# Patient Record
Sex: Male | Born: 1945
Health system: Southern US, Community
[De-identification: ages and names within clinical notes are randomized; demographics above are authoritative.]

## PROBLEM LIST (undated history)

## (undated) DIAGNOSIS — I252 Old myocardial infarction: Secondary | ICD-10-CM

## (undated) DIAGNOSIS — G40909 Epilepsy, unspecified, not intractable, without status epilepticus: Secondary | ICD-10-CM

## (undated) DIAGNOSIS — K863 Pseudocyst of pancreas: Secondary | ICD-10-CM

## (undated) DIAGNOSIS — M21862 Other specified acquired deformities of left lower leg: Secondary | ICD-10-CM

## (undated) DIAGNOSIS — E119 Type 2 diabetes mellitus without complications: Secondary | ICD-10-CM

## (undated) DIAGNOSIS — Z8679 Personal history of other diseases of the circulatory system: Secondary | ICD-10-CM

## (undated) DIAGNOSIS — I10 Essential (primary) hypertension: Secondary | ICD-10-CM

## (undated) DIAGNOSIS — I672 Cerebral atherosclerosis: Secondary | ICD-10-CM

## (undated) DIAGNOSIS — Z9181 History of falling: Secondary | ICD-10-CM

## (undated) DIAGNOSIS — I2510 Atherosclerotic heart disease of native coronary artery without angina pectoris: Secondary | ICD-10-CM

## (undated) DIAGNOSIS — Z85828 Personal history of other malignant neoplasm of skin: Secondary | ICD-10-CM

## (undated) DIAGNOSIS — I69398 Other sequelae of cerebral infarction: Secondary | ICD-10-CM

## (undated) DIAGNOSIS — H9190 Unspecified hearing loss, unspecified ear: Secondary | ICD-10-CM

## (undated) DIAGNOSIS — G20 Parkinson's disease: Secondary | ICD-10-CM

## (undated) DIAGNOSIS — G8114 Spastic hemiplegia affecting left nondominant side: Secondary | ICD-10-CM

## (undated) DIAGNOSIS — I739 Peripheral vascular disease, unspecified: Secondary | ICD-10-CM

## (undated) DIAGNOSIS — M21372 Foot drop, left foot: Secondary | ICD-10-CM

## (undated) DIAGNOSIS — C61 Malignant neoplasm of prostate: Secondary | ICD-10-CM

## (undated) DIAGNOSIS — C679 Malignant neoplasm of bladder, unspecified: Secondary | ICD-10-CM

## (undated) DIAGNOSIS — G20C Parkinsonism, unspecified: Secondary | ICD-10-CM

## (undated) DIAGNOSIS — I6930 Unspecified sequelae of cerebral infarction: Secondary | ICD-10-CM

## (undated) DIAGNOSIS — Z951 Presence of aortocoronary bypass graft: Secondary | ICD-10-CM

## (undated) DIAGNOSIS — R269 Unspecified abnormalities of gait and mobility: Secondary | ICD-10-CM

## (undated) DIAGNOSIS — R414 Neurologic neglect syndrome: Secondary | ICD-10-CM

## (undated) DIAGNOSIS — E785 Hyperlipidemia, unspecified: Secondary | ICD-10-CM

## (undated) DIAGNOSIS — Z9889 Other specified postprocedural states: Secondary | ICD-10-CM

## (undated) HISTORY — PX: CAROTID ENDARTERECTOMY: SUR193

## (undated) HISTORY — DX: Essential (primary) hypertension: I10

## (undated) HISTORY — PX: CAROTID STENT INSERTION: SHX5766

## (undated) HISTORY — DX: Atherosclerotic heart disease of native coronary artery without angina pectoris: I25.10

## (undated) HISTORY — DX: Pseudocyst of pancreas: K86.3

## (undated) HISTORY — DX: Peripheral vascular disease, unspecified: I73.9

## (undated) HISTORY — DX: Epilepsy, unspecified, not intractable, without status epilepticus: G40.909

## (undated) HISTORY — DX: Malignant neoplasm of bladder, unspecified: C67.9

## (undated) HISTORY — PX: CORONARY ARTERY BYPASS GRAFT: SHX141

## (undated) HISTORY — DX: Hyperlipidemia, unspecified: E78.5

## (undated) HISTORY — PX: NM MYOCAR PERF WALL MOTION: HXRAD629

---

## 1996-10-13 HISTORY — PX: LAPAROSCOPIC CHOLECYSTECTOMY: SUR755

## 1996-10-13 HISTORY — PX: OTHER SURGICAL HISTORY: SHX169

## 1999-07-12 ENCOUNTER — Encounter
Admission: RE | Admit: 1999-07-12 | Discharge: 1999-10-10 | Payer: Self-pay | Admitting: Physical Medicine & Rehabilitation

## 1999-08-12 ENCOUNTER — Encounter: Payer: Self-pay | Admitting: *Deleted

## 1999-08-12 ENCOUNTER — Inpatient Hospital Stay (HOSPITAL_COMMUNITY): Admission: EM | Admit: 1999-08-12 | Discharge: 1999-08-13 | Payer: Self-pay | Admitting: *Deleted

## 1999-08-12 ENCOUNTER — Encounter: Payer: Self-pay | Admitting: Neurology

## 1999-08-12 ENCOUNTER — Emergency Department (HOSPITAL_COMMUNITY): Admission: EM | Admit: 1999-08-12 | Discharge: 1999-08-12 | Payer: Self-pay | Admitting: *Deleted

## 1999-12-31 ENCOUNTER — Encounter
Admission: RE | Admit: 1999-12-31 | Discharge: 2000-01-17 | Payer: Self-pay | Admitting: Physical Medicine & Rehabilitation

## 2000-07-09 ENCOUNTER — Emergency Department (HOSPITAL_COMMUNITY): Admission: EM | Admit: 2000-07-09 | Discharge: 2000-07-09 | Payer: Self-pay | Admitting: Emergency Medicine

## 2000-07-09 ENCOUNTER — Encounter: Payer: Self-pay | Admitting: Emergency Medicine

## 2000-10-19 ENCOUNTER — Encounter
Admission: RE | Admit: 2000-10-19 | Discharge: 2001-01-17 | Payer: Self-pay | Admitting: Physical Medicine & Rehabilitation

## 2002-05-19 ENCOUNTER — Encounter
Admission: RE | Admit: 2002-05-19 | Discharge: 2002-06-29 | Payer: Self-pay | Admitting: Physical Medicine & Rehabilitation

## 2002-11-17 ENCOUNTER — Inpatient Hospital Stay (HOSPITAL_COMMUNITY): Admission: RE | Admit: 2002-11-17 | Discharge: 2002-11-26 | Payer: Self-pay | Admitting: *Deleted

## 2002-11-17 ENCOUNTER — Encounter: Payer: Self-pay | Admitting: *Deleted

## 2002-11-17 HISTORY — PX: CARDIAC CATHETERIZATION: SHX172

## 2002-11-18 ENCOUNTER — Encounter: Payer: Self-pay | Admitting: *Deleted

## 2002-11-21 ENCOUNTER — Encounter: Payer: Self-pay | Admitting: Thoracic Surgery (Cardiothoracic Vascular Surgery)

## 2002-11-22 ENCOUNTER — Encounter: Payer: Self-pay | Admitting: Thoracic Surgery (Cardiothoracic Vascular Surgery)

## 2002-11-23 ENCOUNTER — Encounter: Payer: Self-pay | Admitting: Thoracic Surgery (Cardiothoracic Vascular Surgery)

## 2002-11-24 ENCOUNTER — Encounter: Payer: Self-pay | Admitting: Thoracic Surgery (Cardiothoracic Vascular Surgery)

## 2002-12-12 ENCOUNTER — Encounter: Payer: Self-pay | Admitting: Thoracic Surgery (Cardiothoracic Vascular Surgery)

## 2002-12-12 ENCOUNTER — Encounter
Admission: RE | Admit: 2002-12-12 | Discharge: 2002-12-12 | Payer: Self-pay | Admitting: Thoracic Surgery (Cardiothoracic Vascular Surgery)

## 2002-12-24 ENCOUNTER — Encounter: Payer: Self-pay | Admitting: Emergency Medicine

## 2002-12-24 ENCOUNTER — Emergency Department (HOSPITAL_COMMUNITY): Admission: EM | Admit: 2002-12-24 | Discharge: 2002-12-24 | Payer: Self-pay

## 2003-07-13 ENCOUNTER — Emergency Department (HOSPITAL_COMMUNITY): Admission: EM | Admit: 2003-07-13 | Discharge: 2003-07-13 | Payer: Self-pay | Admitting: Emergency Medicine

## 2003-09-05 ENCOUNTER — Encounter
Admission: RE | Admit: 2003-09-05 | Discharge: 2003-12-04 | Payer: Self-pay | Admitting: Physical Medicine & Rehabilitation

## 2003-09-29 ENCOUNTER — Inpatient Hospital Stay (HOSPITAL_COMMUNITY): Admission: EM | Admit: 2003-09-29 | Discharge: 2003-10-04 | Payer: Self-pay | Admitting: Emergency Medicine

## 2003-10-02 ENCOUNTER — Encounter (INDEPENDENT_AMBULATORY_CARE_PROVIDER_SITE_OTHER): Payer: Self-pay | Admitting: Cardiovascular Disease

## 2003-10-03 ENCOUNTER — Encounter (INDEPENDENT_AMBULATORY_CARE_PROVIDER_SITE_OTHER): Payer: Self-pay | Admitting: Cardiology

## 2003-10-06 ENCOUNTER — Ambulatory Visit (HOSPITAL_COMMUNITY): Admission: RE | Admit: 2003-10-06 | Discharge: 2003-10-06 | Payer: Self-pay | Admitting: Neurology

## 2003-12-04 ENCOUNTER — Encounter
Admission: RE | Admit: 2003-12-04 | Discharge: 2004-03-03 | Payer: Self-pay | Admitting: Physical Medicine & Rehabilitation

## 2003-12-12 ENCOUNTER — Ambulatory Visit (HOSPITAL_COMMUNITY): Admission: RE | Admit: 2003-12-12 | Discharge: 2003-12-12 | Payer: Self-pay | Admitting: Neurology

## 2004-03-18 ENCOUNTER — Encounter
Admission: RE | Admit: 2004-03-18 | Discharge: 2004-06-16 | Payer: Self-pay | Admitting: Physical Medicine & Rehabilitation

## 2004-08-21 ENCOUNTER — Ambulatory Visit (HOSPITAL_COMMUNITY): Admission: RE | Admit: 2004-08-21 | Discharge: 2004-08-21 | Payer: Self-pay | Admitting: Gastroenterology

## 2004-10-25 ENCOUNTER — Encounter
Admission: RE | Admit: 2004-10-25 | Discharge: 2005-01-23 | Payer: Self-pay | Admitting: Physical Medicine & Rehabilitation

## 2004-10-29 ENCOUNTER — Ambulatory Visit: Payer: Self-pay | Admitting: Physical Medicine & Rehabilitation

## 2004-12-11 ENCOUNTER — Ambulatory Visit: Payer: Self-pay | Admitting: Physical Medicine & Rehabilitation

## 2005-04-04 ENCOUNTER — Encounter
Admission: RE | Admit: 2005-04-04 | Discharge: 2005-07-03 | Payer: Self-pay | Admitting: Physical Medicine & Rehabilitation

## 2005-05-05 ENCOUNTER — Ambulatory Visit: Payer: Self-pay | Admitting: Physical Medicine & Rehabilitation

## 2005-10-17 ENCOUNTER — Encounter
Admission: RE | Admit: 2005-10-17 | Discharge: 2006-01-15 | Payer: Self-pay | Admitting: Physical Medicine & Rehabilitation

## 2005-11-07 ENCOUNTER — Ambulatory Visit: Payer: Self-pay | Admitting: Physical Medicine & Rehabilitation

## 2006-05-04 ENCOUNTER — Ambulatory Visit: Payer: Self-pay | Admitting: Physical Medicine & Rehabilitation

## 2006-05-04 ENCOUNTER — Encounter
Admission: RE | Admit: 2006-05-04 | Discharge: 2006-08-02 | Payer: Self-pay | Admitting: Physical Medicine & Rehabilitation

## 2006-10-26 ENCOUNTER — Encounter
Admission: RE | Admit: 2006-10-26 | Discharge: 2007-01-24 | Payer: Self-pay | Admitting: Physical Medicine & Rehabilitation

## 2006-10-26 ENCOUNTER — Ambulatory Visit: Payer: Self-pay | Admitting: Physical Medicine & Rehabilitation

## 2006-11-05 ENCOUNTER — Encounter
Admission: RE | Admit: 2006-11-05 | Discharge: 2006-11-19 | Payer: Self-pay | Admitting: Physical Medicine & Rehabilitation

## 2006-11-12 ENCOUNTER — Inpatient Hospital Stay (HOSPITAL_COMMUNITY): Admission: EM | Admit: 2006-11-12 | Discharge: 2006-11-14 | Payer: Self-pay | Admitting: Emergency Medicine

## 2006-11-13 ENCOUNTER — Encounter (INDEPENDENT_AMBULATORY_CARE_PROVIDER_SITE_OTHER): Payer: Self-pay | Admitting: Neurology

## 2006-11-13 ENCOUNTER — Encounter (INDEPENDENT_AMBULATORY_CARE_PROVIDER_SITE_OTHER): Payer: Self-pay | Admitting: Cardiology

## 2006-11-20 ENCOUNTER — Encounter: Payer: Self-pay | Admitting: Neurology

## 2006-11-20 ENCOUNTER — Inpatient Hospital Stay (HOSPITAL_COMMUNITY): Admission: AD | Admit: 2006-11-20 | Discharge: 2006-11-22 | Payer: Self-pay | Admitting: Neurology

## 2006-12-22 ENCOUNTER — Ambulatory Visit: Payer: Self-pay | Admitting: Physical Medicine & Rehabilitation

## 2007-06-11 ENCOUNTER — Encounter
Admission: RE | Admit: 2007-06-11 | Discharge: 2007-06-15 | Payer: Self-pay | Admitting: Physical Medicine & Rehabilitation

## 2007-06-15 ENCOUNTER — Ambulatory Visit: Payer: Self-pay | Admitting: Physical Medicine & Rehabilitation

## 2008-08-24 ENCOUNTER — Encounter
Admission: RE | Admit: 2008-08-24 | Discharge: 2008-08-25 | Payer: Self-pay | Admitting: Physical Medicine & Rehabilitation

## 2008-08-25 ENCOUNTER — Ambulatory Visit: Payer: Self-pay | Admitting: Physical Medicine & Rehabilitation

## 2009-08-21 ENCOUNTER — Encounter
Admission: RE | Admit: 2009-08-21 | Discharge: 2009-10-04 | Payer: Self-pay | Admitting: Physical Medicine & Rehabilitation

## 2009-08-21 ENCOUNTER — Ambulatory Visit: Payer: Self-pay | Admitting: Physical Medicine & Rehabilitation

## 2009-10-17 ENCOUNTER — Encounter
Admission: RE | Admit: 2009-10-17 | Discharge: 2010-01-15 | Payer: Self-pay | Admitting: Physical Medicine & Rehabilitation

## 2009-10-19 ENCOUNTER — Ambulatory Visit: Payer: Self-pay | Admitting: Physical Medicine & Rehabilitation

## 2010-04-04 ENCOUNTER — Encounter
Admission: RE | Admit: 2010-04-04 | Discharge: 2010-04-10 | Payer: Self-pay | Admitting: Physical Medicine & Rehabilitation

## 2010-04-10 ENCOUNTER — Ambulatory Visit: Payer: Self-pay | Admitting: Physical Medicine & Rehabilitation

## 2011-01-03 HISTORY — PX: US ECHOCARDIOGRAPHY: HXRAD669

## 2011-01-14 ENCOUNTER — Other Ambulatory Visit (INDEPENDENT_AMBULATORY_CARE_PROVIDER_SITE_OTHER): Payer: Medicare Other

## 2011-01-14 ENCOUNTER — Encounter (INDEPENDENT_AMBULATORY_CARE_PROVIDER_SITE_OTHER): Payer: Medicare Other | Admitting: Vascular Surgery

## 2011-01-14 DIAGNOSIS — I6529 Occlusion and stenosis of unspecified carotid artery: Secondary | ICD-10-CM

## 2011-01-14 DIAGNOSIS — Z48812 Encounter for surgical aftercare following surgery on the circulatory system: Secondary | ICD-10-CM

## 2011-01-14 NOTE — Consult Note (Signed)
NEW PATIENT CONSULTATION  [name], [name] DOB:  1945/10/18                                       01/14/2011 [id number]#:[id number]  Patient presents today for evaluation of extracranial cerebrovascular occlusive disease.  He is a very pleasant 65 year old gentleman with a complex past history of cerebrovascular occlusive disease.  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  He had a continued prolonged neurologic deficit following this.  He continues to have minimal use of his left arm.  He is able to walk with the assistance of a cane.  Subsequently he did have coronary artery bypass grafting in February 2004 at [facility] with Dr. Andrey Spearman.  He began having seizures in February 2009 and is on medication for this.  He did report what sounds like transient ischemic attacks 6-8 years ago but nothing recent.  He does have elevated cholesterol, and he does have elevated blood pressure.  He has a long history of cigarette smoking, and fortunately quit this in 2009.  He does not drink alcohol.  He is divorced with 4 children.  FAMILY HISTORY:  Significant for father dying at age 37 of cerebral hemorrhage.  REVIEW OF SYSTEMS:  Negative for weight loss or gain.  He weighs 147 pounds.  He is 5 feet 7 inches tall. VASCULAR:  Positive for  prior stroke. CARDIAC:  Prior coronary artery bypass grafting. GI:  Negative. NEUROLOGIC:  Positive for prior stroke and seizure. PULMONARY:  Negative. HEMATOLOGIC:  Negative. GU, ENT, musculoskeletal, psychiatric were all negative.  PHYSICAL EXAMINATION:  A well-developed and well-nourished white male appearing his stated age.  Blood pressure 144/94, pulse 72, respirations 16.  HEENT is normal.  Chest:  Clear bilaterally.  Heart:  Regular  rate and rhythm.  I do not appreciate carotid bruits.  He has a well-healed incision __________.  He does have 2+ radial pulses bilaterally. Abdomen:  Soft, nontender.  No masses noted.  Musculoskeletal:  No major deformities or cyanosis.  Neurologic:  He does have minimal use with paralysis of his left upper extremity and does have a brace to keep him from having a foot drop in his left lower extremity.  Skin:  Without ulcers or rashes.  He underwent repeat carotid duplex in our office, and this had several findings.  He does have high-grade stenosis in the distal common carotid artery just below the bifurcation on the left.  On the right, there is no flow-limiting stenosis, but he does have web-like structure in the carotid bulb, which could represent a focal dissection or scarring from his old surgery.  I discussed all these at length with patient.  I have recommend formal cerebral arteriogram due to the complexity of both these carotid lesions.  This has been scheduled as an outpatient at [facility] with Dr. Durene Cal on 04/11.  We will make further recommendations pending this arteriogram.  I did explain an approximately 1% risk of stroke or neurologic deficit with the cerebral arteriogram.  This is scheduled for 04/11.  __________.    Larina Earthly, M.D. Electronically Signed  TFE/MEDQ  D:  01/14/2011  T:  01/14/2011  Job:  [id number]  cc:  Evie Lacks, MD Richard A. Alanda Amass, M.D.

## 2011-01-16 NOTE — Procedures (Unsigned)
CAROTID DUPLEX EXAM  INDICATION:  Follow up carotid disease.  History of prior study at outside facility on 12/17/10 showed >70% left CCA stenosis, 50% to 69% right bulb stenosis.  HISTORY: Diabetes:  No. Cardiac:  CABG. Hypertension:  Yes. Smoking:  Previous. Previous Surgery:  Bilateral CEAs in 1999. CV History:  Stroke, 1999 and 2004; no new symptoms. Amaurosis Fugax , Paresthesias , Hemiparesis                                      RIGHT             LEFT Brachial systolic pressure:         150               142 Brachial Doppler waveforms: Vertebral direction of flow:        Abnormal antegrade                  Antegrade DUPLEX VELOCITIES (cm/sec) CCA peak systolic                   192               427/120 ECA peak systolic                   84                116 ICA peak systolic                   84                85 ICA end diastolic                   25                28 PLAQUE MORPHOLOGY: PLAQUE AMOUNT:                      Moderate          Severe PLAQUE LOCATION:                    CCA               CCA  IMPRESSION: 1. Patent bilateral carotid endarterectomy with significant post-     surgical changes observed. 2. The right distal common carotid artery has possible ulcerative     plaque versus dissection. 3. The right internal carotid artery has minimal restenosis (1% to     39%). 4. The left common carotid artery at the carotid endarterectomy start     point has significant focal hyperplasia with velocities of 427     cm/s. 5. The left internal carotid artery is difficult to grade due to     proximal disease; however, no significant stenosis is observed.     Distal waveforms are abnormal and may suggest more distal disease. 6. The right vertebral artery is abnormal antegrade.  The left is     within normal limits.  ___________________________________________ Larina Earthly, M.D.  LT/MEDQ  D:  01/14/2011  T:  01/14/2011  Job:   [id number]

## 2011-01-20 ENCOUNTER — Ambulatory Visit
Admission: RE | Admit: 2011-01-20 | Discharge: 2011-01-20 | Disposition: A | Discharge status: Home / Self Care | Payer: Medicare Other | Source: Ambulatory Visit | Attending: Family Medicine | Admitting: Family Medicine

## 2011-01-20 ENCOUNTER — Other Ambulatory Visit: Payer: Self-pay | Admitting: Family Medicine

## 2011-01-20 DIAGNOSIS — M2540 Effusion, unspecified joint: Secondary | ICD-10-CM

## 2011-01-22 ENCOUNTER — Ambulatory Visit (HOSPITAL_COMMUNITY)
Admission: RE | Admit: 2011-01-22 | Discharge: 2011-01-22 | Disposition: A | Discharge status: Home / Self Care | Payer: Medicare Other | Source: Ambulatory Visit | Attending: Surgery | Admitting: Surgery

## 2011-01-22 DIAGNOSIS — I69998 Other sequelae following unspecified cerebrovascular disease: Secondary | ICD-10-CM | POA: Insufficient documentation

## 2011-01-22 DIAGNOSIS — E785 Hyperlipidemia, unspecified: Secondary | ICD-10-CM | POA: Insufficient documentation

## 2011-01-22 DIAGNOSIS — R29818 Other symptoms and signs involving the nervous system: Secondary | ICD-10-CM | POA: Insufficient documentation

## 2011-01-22 DIAGNOSIS — I658 Occlusion and stenosis of other precerebral arteries: Secondary | ICD-10-CM | POA: Insufficient documentation

## 2011-01-22 DIAGNOSIS — Z79899 Other long term (current) drug therapy: Secondary | ICD-10-CM | POA: Insufficient documentation

## 2011-01-22 DIAGNOSIS — I10 Essential (primary) hypertension: Secondary | ICD-10-CM | POA: Insufficient documentation

## 2011-01-22 DIAGNOSIS — I6529 Occlusion and stenosis of unspecified carotid artery: Secondary | ICD-10-CM | POA: Insufficient documentation

## 2011-01-22 DIAGNOSIS — Z951 Presence of aortocoronary bypass graft: Secondary | ICD-10-CM | POA: Insufficient documentation

## 2011-01-22 LAB — POCT I-STAT, CHEM 8
BUN: 18 mg/dL (ref 6–23)
Calcium, Ion: 1.22 mmol/L (ref 1.12–1.32)
Chloride: 106 mEq/L (ref 96–112)
Creatinine, Ser: 1 mg/dL (ref 0.4–1.5)
Glucose, Bld: 127 mg/dL — ABNORMAL HIGH (ref 70–99)
HCT: 41 % (ref 39.0–52.0)
Hemoglobin: 13.9 g/dL (ref 13.0–17.0)
Potassium: 4.8 mEq/L (ref 3.5–5.1)
Sodium: 140 mEq/L (ref 135–145)
TCO2: 24 mmol/L (ref 0–100)

## 2011-02-03 ENCOUNTER — Ambulatory Visit: Payer: Medicare Other | Admitting: Surgery

## 2011-02-04 ENCOUNTER — Ambulatory Visit (INDEPENDENT_AMBULATORY_CARE_PROVIDER_SITE_OTHER): Payer: Medicare Other | Admitting: Vascular Surgery

## 2011-02-04 DIAGNOSIS — I6529 Occlusion and stenosis of unspecified carotid artery: Secondary | ICD-10-CM

## 2011-02-05 NOTE — Assessment & Plan Note (Signed)
OFFICE VISIT  [name], [name] DOB:  1946/03/23                                       02/04/2011 EAVWU#:[id number]  Patient presents today for continued discussion regarding his asymptomatic carotid disease.  I have seen him in initial consultation with a duplex showing high-grade bilateral carotid stenoses.  His initial visit with me was on 01/14/11.  I had recommended cerebral arteriogram for further evaluation.  He had a remote history of bilateral carotid endarterectomies in [place], [place] after an acute stroke.  He underwent outpatient arteriogram on 04/11 with Dr. Durene Cal.  This did confirm the duplex findings.  He does have a high- grade stenosis in both carotid systems.  Both of these are extremely focal with weblike stenosis..  On the right, this is in the proximal internal carotid artery.  On the left on the common carotid artery at the lower end of the carotid endarterectomy.  I had a long discussion with patient and his wife present.  This is certainly concerning with high-grade stenoses bilaterally.  I explained the indication for re-do endarterectomy versus carotid stenting.  I explained that my bias is normally towards re-do carotid endarterectomy. Due to the very focal weblike stenosis, I feel that it would be appropriate to entertain carotid stenting.  I reviewed the films with Dr. Durene Cal and discussed the recent benefit of stenting versus endarterectomy.  We both agree that due to the very focal stenosis with no extensive calcification that he would be best served with staged bilateral carotid stenting.  Patient and his wife understand and agree with this plan, and we will proceed with this at his earliest convenience.  Due to his prior significant right brain deficit with left- sided weakness, we have recommended left stenting first since this is his dominant hemisphere and has not had a deficit on this side.  We  will proceed with this at his convenience in several weeks.    Larina Earthly, M.D. Electronically Signed  TFE/MEDQ  D:  02/05/2011  T:  02/05/2011  Job:  [id number]  cc:   Evie Lacks, MD Richard A. Alanda Amass, M.D.

## 2011-02-12 ENCOUNTER — Inpatient Hospital Stay (HOSPITAL_COMMUNITY)
Admission: RE | Admit: 2011-02-12 | Discharge: 2011-02-13 | DRG: 036 | Disposition: A | Discharge status: Home / Self Care | Payer: Medicare Other | Source: Ambulatory Visit | Attending: Surgery | Admitting: Surgery

## 2011-02-12 DIAGNOSIS — I658 Occlusion and stenosis of other precerebral arteries: Secondary | ICD-10-CM | POA: Diagnosis present

## 2011-02-12 DIAGNOSIS — I2510 Atherosclerotic heart disease of native coronary artery without angina pectoris: Secondary | ICD-10-CM | POA: Diagnosis present

## 2011-02-12 DIAGNOSIS — Z7982 Long term (current) use of aspirin: Secondary | ICD-10-CM

## 2011-02-12 DIAGNOSIS — M216X9 Other acquired deformities of unspecified foot: Secondary | ICD-10-CM | POA: Diagnosis present

## 2011-02-12 DIAGNOSIS — Z8673 Personal history of transient ischemic attack (TIA), and cerebral infarction without residual deficits: Secondary | ICD-10-CM

## 2011-02-12 DIAGNOSIS — Z951 Presence of aortocoronary bypass graft: Secondary | ICD-10-CM

## 2011-02-12 DIAGNOSIS — Z79899 Other long term (current) drug therapy: Secondary | ICD-10-CM

## 2011-02-12 DIAGNOSIS — G40909 Epilepsy, unspecified, not intractable, without status epilepticus: Secondary | ICD-10-CM | POA: Diagnosis present

## 2011-02-12 DIAGNOSIS — Z7902 Long term (current) use of antithrombotics/antiplatelets: Secondary | ICD-10-CM

## 2011-02-12 DIAGNOSIS — E7800 Pure hypercholesterolemia, unspecified: Secondary | ICD-10-CM | POA: Diagnosis present

## 2011-02-12 DIAGNOSIS — I6529 Occlusion and stenosis of unspecified carotid artery: Principal | ICD-10-CM | POA: Diagnosis present

## 2011-02-12 LAB — POCT I-STAT, CHEM 8
BUN: 24 mg/dL — ABNORMAL HIGH (ref 6–23)
Calcium, Ion: 1.17 mmol/L (ref 1.12–1.32)
Chloride: 107 mEq/L (ref 96–112)
Creatinine, Ser: 1.2 mg/dL (ref 0.4–1.5)
Glucose, Bld: 116 mg/dL — ABNORMAL HIGH (ref 70–99)
HCT: 41 % (ref 39.0–52.0)
Hemoglobin: 13.9 g/dL (ref 13.0–17.0)
Potassium: 4.4 mEq/L (ref 3.5–5.1)
Sodium: 139 mEq/L (ref 135–145)
TCO2: 23 mmol/L (ref 0–100)

## 2011-02-12 LAB — MRSA PCR SCREENING: MRSA by PCR: NEGATIVE

## 2011-02-12 LAB — POCT ACTIVATED CLOTTING TIME: Activated Clotting Time: 429 seconds

## 2011-02-13 LAB — CARDIAC PANEL(CRET KIN+CKTOT+MB+TROPI)
CK, MB: 1.6 ng/mL (ref 0.3–4.0)
Relative Index: INVALID (ref 0.0–2.5)
Total CK: 42 U/L (ref 7–232)
Troponin I: <0.3 ng/mL (ref <0.30)

## 2011-02-13 LAB — CBC
HCT: 36.6 % — ABNORMAL LOW (ref 39.0–52.0)
Hemoglobin: 12.7 g/dL — ABNORMAL LOW (ref 13.0–17.0)
MCH: 33.8 pg (ref 26.0–34.0)
MCHC: 34.7 g/dL (ref 30.0–36.0)
MCV: 97.3 fL (ref 78.0–100.0)
Platelets: 141 10*3/uL — ABNORMAL LOW (ref 150–400)
RBC: 3.76 MIL/uL — ABNORMAL LOW (ref 4.22–5.81)
RDW: 13 % (ref 11.5–15.5)
WBC: 9.4 10*3/uL (ref 4.0–10.5)

## 2011-02-13 LAB — BASIC METABOLIC PANEL
BUN: 22 mg/dL (ref 6–23)
CO2: 26 mEq/L (ref 19–32)
Calcium: 8.9 mg/dL (ref 8.4–10.5)
Chloride: 102 mEq/L (ref 96–112)
Creatinine, Ser: 0.98 mg/dL (ref 0.4–1.5)
GFR calc Af Amer: >60 mL/min (ref >60)
GFR calc non Af Amer: >60 mL/min (ref >60)
Glucose, Bld: 127 mg/dL — ABNORMAL HIGH (ref 70–99)
Potassium: 4.1 mEq/L (ref 3.5–5.1)
Sodium: 136 mEq/L (ref 135–145)

## 2011-02-18 NOTE — Discharge Summary (Addendum)
NAME:  [name], [name]               ACCOUNT NO.:  [id number]  MEDICAL RECORD NO.:  [id number]           PATIENT TYPE:  I  LOCATION:  3301                         FACILITY:  [facility]  PHYSICIAN:  Juleen China IV, MDDATE OF BIRTH:  [birth year]  DATE OF ADMISSION:  02/12/2011 DATE OF DISCHARGE:  02/13/2011                              DISCHARGE SUMMARY   CHIEF COMPLAINT:  Recurrent bilateral carotid stenosis.  The patient is a 65 year old gentleman with a complex history of cerebrovascular occlusive disease.  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  He has prolonged neurological deficits following this with minimal use of his left arm.  He is able to walk with assistance of a cane and a splint for the footdrop in his left lower extremity.  The patient had coronary artery bypass in February 2004.  He began having seizures in February 2009.  He had some TIAs 16 years ago, but nothing recent.  He underwent carotid duplex scan, which showed high-grade stenosis in the distal common carotid artery just below the bifurcation on the left.  On the right, there is no flow-limiting stenosis, but he had a web-like structure in the carotid bulb, which could represent a dissection or scarring from his old surgery.  It was recommended that he have formal cerebral arteriogram, which he had in April 2011.  This was confirmed high-grade stenosis in both carotid arteries and both focal.  He was admitted for a left carotid stent and he will then have a staged right carotid stent.  PAST MEDICAL HISTORY:  As above with history of stroke, seizures, coronary artery bypass grafting, coronary artery disease, hypercholesterolemia.  HOSPITAL COURSE:  The patient was taken to the [facility] on Feb 12, 2011, for a left carotid stent with percutaneous closure.  The stenosis was reduced from  80% to less than 10%.  Postoperatively, the patient did well.  He was alert and oriented x3.  He was ambulating with his cane and brace and otherwise neurologically he remained stable with profound weakness in the left upper extremity and footdrop on the left.  He had good strength in the right upper and lower extremities.  He will be discharged to home and follow up with Dr. Myra Gianotti in a couple weeks. His groin wound was soft without hematoma.  FINAL DIAGNOSIS:  Recurrent bilateral carotid stenosis status post left carotid stent with previous history of bilateral carotid endarterectomies.  All the rest of his medical issues were stable and controlled with his present medications.  His cardiac enzymes were negative.  DISPOSITION:  The patient was discharged home and will follow up with Dr. Myra Gianotti in 2 weeks.  DISCHARGE MEDICATIONS: 1. Aspirin 81 mg daily. 2. Benicar 20 mg half tablet daily. 3. Crestor 20 mg 1-1/2 tablet daily. 4. CoQ10 of 50 mg daily. 5. Dilantin 100 mg three times a day. 6. Dantrium 50 mg daily. 7. Fish oil 1000 mg daily. 8. Flaxseed oil 1000 mg daily. 9. Folic/2.5/25 2 mg 1 tablet daily at bedtime. 10.Keppra  500 mg twice daily. 11.Magnesium oxide 250 mg daily. 12.Metoprolol 25 mg daily at bedtime. 13.Plavix 75 mg daily. 14.Vitamin C 500 mg daily. 15.Zetia 10 mg daily.     Della Goo, PA-C   ______________________________ Seth Bake. Charlena Cross, MD    RR/MEDQ  D:  02/13/2011  T:  02/13/2011  Job:  [id number]  Electronically Signed by Della Goo PA on 02/18/2011 01:36:50 PM Electronically Signed by Arelia Longest IV MD on 02/24/2011 10:53:59 PM

## 2011-02-24 NOTE — Op Note (Signed)
NAME:  [name], [name]               ACCOUNT NO.:  [id number]  MEDICAL RECORD NO.:  [id number]           PATIENT TYPE:  O  LOCATION:  SDSC                         FACILITY:  [facility]  PHYSICIAN:  Juleen China IV, MDDATE OF BIRTH:  09-14-1946  DATE OF PROCEDURE:  01/22/2011 DATE OF DISCHARGE:                              OPERATIVE REPORT   PREOPERATIVE DIAGNOSIS:  Bilateral recurrent carotid stenosis.  POSTOPERATIVE DIAGNOSIS:  Bilateral recurrent carotid stenosis.  PROCEDURE PERFORMED: 1. Ultrasound access right femoral artery. 2. Aortic arch angiogram. 3. Bilateral carotid angiogram. 4. Left vertebral angiogram.  INDICATIONS:  Mr. Duvall has had bilateral carotid endarterectomies performed in [place].  Surveillance ultrasound revealed progressive stenosis and he comes in today for formal angiographic evaluation.  PROCEDURE:  The patient identified in the holding area and taken to room 8, placed supine on the table.  Right groin was prepped and draped in usual fashion.  Time-out was called.  The right femoral artery was evaluated with ultrasound and found to be widely patent.  A digital ultrasound image was acquired.  The right femoral artery was accessed under ultrasound guidance with an 18-gauge needle.  An 0.035 wire was advanced into the aorta.  Under fluoroscopic visualization, a 5-French sheath was placed.  Over the wire, a pigtail catheter was advanced into the ascending aorta and aortic arch angiogram was performed.  Next, using a Bernstein II catheter, the right common carotid artery was cannulated and a right carotid angiogram was performed with intracranial images.  Next, the left common carotid artery was cannulated with Robb Matar II catheter and left carotid angiogram with intracranial images was performed.  Next, the left vertebral artery was cannulated with the Bernstein II catheter and a left vertebral angiogram was performed with intracranial images.  All  intracranial imaging will be interpreted by Neuroradiology.  Aortic arch angiogram:  A type 1 aortic arch is identified.  Minimal stenosis is present within the innominate artery, right subclavian and visualized portion of the right common carotid artery are all widely patent.  The right vertebral artery originates from the right subclavian artery, its origin is not well identified.  The visualized portions of the right subclavian artery are widely patent.  The visualized portions of the left common carotid artery are widely patent.  The left subclavian artery is patent in its proximal extent.  There is diffuse disease within the left distal and the subclavian artery distal to the vertebral artery without stenosis identified.  However, the artery is somewhat diminutive in caliber.  Right carotid angiogram:  A weblike projection within the right mid, right internal carotid artery is visualized.  This is best identified on the AP projection, there is approximately an 80% stenosis.  The remaining portion of the internal and common carotid artery are patent. The external carotid artery is patent on the right.  Left carotid angiogram:  The left common carotid artery is widely patent.  The left external carotid artery is widely patent, left internal carotid artery is widely patent.  There is a stenosis what looks like correlates to the proximal portion of the previous patch. This appears to  be approximately 80%.  Left vertebral angiogram:  The visualized portions of the left vertebral artery are widely patent without significant stenosis.  At this point, decision was made to terminate the procedure.  Catheters and wires were removed.  The patient was taken to the holding area for sheath pull.  IMPRESSION:  Bilateral 80% carotid stenosis on the right.  There is a weblike projection best seen on the AP images which correlates to approximately 80% stenosis.  On the left,  there is a  stenosis in the proximal patch which is approximately 80%.     Jorge Ny, MD     VWB/MEDQ  D:  01/22/2011  T:  01/23/2011  Job:  [id number]  Electronically Signed by Arelia Longest IV MD on 02/24/2011 10:53:56 PM

## 2011-02-24 NOTE — Op Note (Signed)
NAME:  [name], [name]               ACCOUNT NO.:  [id number]  MEDICAL RECORD NO.:  [id number]           PATIENT TYPE:  I  LOCATION:  3301                         FACILITY:  [facility]  PHYSICIAN:  Juleen China IV, MDDATE OF BIRTH:  11-23-1945  DATE OF PROCEDURE:  02/12/2011 DATE OF DISCHARGE:                              OPERATIVE REPORT   PREOPERATIVE DIAGNOSIS:  Recurrent left carotid stenosis.  POSTOPERATIVE DIAGNOSIS:  Recurrent left carotid stenosis.  PROCEDURE PERFORMED:  Left carotid stent with distal embolic protection.  SURGEON: 1. Charlena Cross, MD and Nanetta Batty, MD  DEVICES USED:  Filter was a 6.0 Angioguard.  Stent was a 10 x 30 Precise postdilation with a 5 x 2 balloon.  INDICATIONS:  This is a 65 year old gentleman who has previously undergone bilateral carotid endarterectomies 13 years ago in [place]. Ultrasound has detected a recurrence on both sides greater than 80%.  He comes in after discussion of operative versus endovascular repair.  We decided to proceed with endovascular repair.  PROCEDURE:  The patient was identified in the holding area and taken to room #8, placed supine on the table.  Both groins were prepped and draped in usual fashion.  Time-out was called.  Ultrasound was used to evaluate the right common femoral artery, it was widely patent without significant calcification.  A digital image was acquired.  The right common femoral artery was then accessed under ultrasound guidance with a micropuncture needle and 0.018 wire was advanced without resistance followed by micropuncture sheath.  Micropuncture sheath was exchanged out over a Bentson wire and a 6-French long sheath was placed.  At this point in time, Angiomax was administered, it was corroborated with ACT being greater than 400.  Multiple catheters were used to attempt to select the left common carotid artery including a JB1 and a renal double curve ultimately using a V-Tech  catheter and a Bentson wire.  The left common carotid artery was selected.  The catheter was advanced over a Bentson wire followed by advancement of the sheath into the left common carotid artery.  Digital subtracted images were performed confirming successful cannulation as well as intracranial impressions in the AP and lateral position, which will be separately dictated by Neuroradiology.  At this point, the filter was prepared on the back table.  This was a 6.0 Angioguard, it was advanced over the wire, deployed in a straight portion of the distal internal carotid artery.  Next, predilatation of the stenosis was performed with a 3 x 2 balloon.  A 10 x 30 Precise self- expanding stent was then prepared on the back table and then advanced over the wire, deployed landing distally at the carotid bifurcation and extending proximally across the lesion.  This was postdilated with a 5- mm balloon.  Completion arteriogram revealed resolution of the stenosis from 80% down to less than 10%.  Intracranial images were performed. There was no gross change from the preintervention study.  At this point, decision was made to terminate the procedure.  Catheters and wires were removed.  A retrograde right femoral angiogram was performed to evaluate for closure device and  found to be adequate.  A ProGlide was successfully deployed.  IMPRESSION:  Successful deployment of left carotid stent with distal embolic protection using a 6.0 Angioguard filter and a 10 x 30 Precise stent with resolution of the stenosis from 80% down to less than 10%.     Jorge Ny, MD     VWB/MEDQ  D:  02/12/2011  T:  02/12/2011  Job:  [id number]  cc:   Larina Earthly, M.D.  Electronically Signed by Arelia Longest IV MD on 02/24/2011 10:54:02 PM

## 2011-02-25 NOTE — Assessment & Plan Note (Signed)
HISTORY OF PRESENT ILLNESS:  [name] is back regarding his spastic left  hemiparesis.  Nothing has really changed over the summer.  He still has  some more problems with memory and balance, more so that usual.  He  attributes this to the recent seizure.  He remains on Dantrium, Keppra  and Dilantin.  He is not driving.  He is wearing his AFO for balance.  Tone is generally stable and at times his left upper extremity is doing  very well.  He is sleeping well.   REVIEW OF SYSTEMS:  Notable for numbness, tremor, confusion, coughing,  weight loss and easy bleeding.  Full review is noted in the health and  history section.   SOCIAL HISTORY:  The patient is divorced, living with his ex-wife.   PHYSICAL EXAMINATION:  Blood pressure is 140/81, pulse 62, respiratory  rate 18.  He is saturating 98% on room air.  The patient is pleasant,  alert and oriented times 3.  Affect is bright and generally appropriate.  He walks with a limp and favors the left leg with hyperextension  movement at the left knee in stance phase.  Tone is 1+ out of 5 in the  upper and lower extremities today.  He is able to grip my hand somewhat.  He had a bit of a contracture at the left elbow of 5 or 10 degrees.  I  was able to fully passively range both his left knee and ankle today.  AFO seems to be fitting fairly well, although there is some redness  along the posterior aspect on the calf.  HEART:  Regular.  CHEST:  Clear.  ABDOMEN:  Soft, nontender.   ASSESSMENT:  1. Right cerebrovascular accident with spastic left hemiparesis.  2. Seizure disorder.   PLAN:  1. Will continue Dantrium, Keppra and Dilantin for now.  He can      consider tapering off Dantrium if he is overly concerned regarding      his fatigue and confusion.  This certainly could be contributing to      it.  He is not a candidate to stop Keppra or Dilantin at this      point.  2. Discussed multiple exercises to improve knee stabilization,  including walking, biking, pool therapy, etc.  I think he would do      well with all of these.  Could look at another type of dynamic      ankle brace, although I am not sure how this would affect his knee      extension movement.  3. Will see him back in about 1 year's time.      Ranelle Oyster, M.D.  Electronically Signed     ZTS/MedQ  D:  06/15/2007 12:48:45  T:  06/15/2007 13:49:00  Job #:  [id number]

## 2011-02-25 NOTE — Assessment & Plan Note (Signed)
[name] is back regarding his spastic left hemiparesis.  He is really doing  fairly well.  He does report some new drooling since he has seizure and  when he fatigues.  He remains on Dilantin, Keppra, and Dantrium.  Dr.  Sandria Manly follows empirically for his Dilantin.  He denies pain today.  He  feels that overall he has been doing a bit better.  He stopped smoking.  He gained some weight initially, but has lost that all now.  [facility] and machines  that he likes to use.   REVIEW OF SYSTEMS:  Notable for numbness, spasms, and occasional high  sugars.  Otherwise pertinent positives were as above and full review is  in written health and history section.   SOCIAL HISTORY:  The patient is divorced, but lives with his ex-wife.   PHYSICAL EXAMINATION:  VITAL SIGNS:  Blood pressure 121/63, pulse 62,  respiratory rate 18, and sating 96% on room air.  GENERAL:  The patient is pleasant, alert, and oriented x3.  Affect is  strongly bright and appropriate.  His hand range of motion at the time  was improved from last visit, really 1 to perhaps 1+/5 and sometimes  less than that when he relaxes.  He was walking slower, but with better  form today.  He has good leg swing and clearance of the foot.  HEART:  Regular.  CHEST:  Clear.  ABDOMEN:  Soft and nontender.  Cognitively, he appeared to be at his  baseline.   ASSESSMENT:  1. Right cerebrovascular accident, spastic left hemiparesis.  2. Seizure disorder.   PLAN:  1. Continue Dilantin, Keppra, and Dantrium for now.  The patient also      was started on a new cholesterol medication, so I would recommend      him checking liver function test in the near future.  He may want      to talk with Dr. Sandria Manly regarding his seizure medication and about      potential other options that may cause less fatigue and secondary      drooling perhaps.  2. Continue with exercise as he is doing.  I think he  is doing a good      job.  3. I will see him back in a year.      Ranelle Oyster, M.D.  Electronically Signed     ZTS/MedQ  D:  08/25/2008 16:31:11  T:  08/26/2008 05:06:05  Job #:  [id number]   cc:   Genene Churn. Love, M.D.  Fax: [phone]

## 2011-02-26 ENCOUNTER — Inpatient Hospital Stay (HOSPITAL_COMMUNITY)
Admission: RE | Admit: 2011-02-26 | Discharge: 2011-02-27 | DRG: 036 | Disposition: A | Discharge status: Home / Self Care | Payer: Medicare Other | Source: Ambulatory Visit | Attending: Surgery | Admitting: Surgery

## 2011-02-26 DIAGNOSIS — Z79899 Other long term (current) drug therapy: Secondary | ICD-10-CM

## 2011-02-26 DIAGNOSIS — Z7982 Long term (current) use of aspirin: Secondary | ICD-10-CM

## 2011-02-26 DIAGNOSIS — I6529 Occlusion and stenosis of unspecified carotid artery: Principal | ICD-10-CM | POA: Diagnosis present

## 2011-02-26 DIAGNOSIS — Z8673 Personal history of transient ischemic attack (TIA), and cerebral infarction without residual deficits: Secondary | ICD-10-CM

## 2011-02-26 DIAGNOSIS — Z7902 Long term (current) use of antithrombotics/antiplatelets: Secondary | ICD-10-CM

## 2011-02-26 LAB — POCT I-STAT, CHEM 8
BUN: 19 mg/dL (ref 6–23)
Calcium, Ion: 1.29 mmol/L (ref 1.12–1.32)
Chloride: 106 mEq/L (ref 96–112)
Creatinine, Ser: 1.1 mg/dL (ref 0.4–1.5)
Glucose, Bld: 113 mg/dL — ABNORMAL HIGH (ref 70–99)
HCT: 38 % — ABNORMAL LOW (ref 39.0–52.0)
Hemoglobin: 12.9 g/dL — ABNORMAL LOW (ref 13.0–17.0)
Potassium: 4.4 mEq/L (ref 3.5–5.1)
Sodium: 141 mEq/L (ref 135–145)
TCO2: 25 mmol/L (ref 0–100)

## 2011-02-26 LAB — POCT ACTIVATED CLOTTING TIME: Activated Clotting Time: 446 seconds

## 2011-02-27 LAB — CBC
HCT: 35.9 % — ABNORMAL LOW (ref 39.0–52.0)
Hemoglobin: 12.3 g/dL — ABNORMAL LOW (ref 13.0–17.0)
MCH: 33.3 pg (ref 26.0–34.0)
MCHC: 34.3 g/dL (ref 30.0–36.0)
MCV: 97.3 fL (ref 78.0–100.0)
Platelets: 175 10*3/uL (ref 150–400)
RBC: 3.69 MIL/uL — ABNORMAL LOW (ref 4.22–5.81)
RDW: 12.9 % (ref 11.5–15.5)
WBC: 9.6 10*3/uL (ref 4.0–10.5)

## 2011-02-27 LAB — BASIC METABOLIC PANEL
BUN: 16 mg/dL (ref 6–23)
CO2: 28 mEq/L (ref 19–32)
Calcium: 9.5 mg/dL (ref 8.4–10.5)
Chloride: 104 mEq/L (ref 96–112)
Creatinine, Ser: 0.97 mg/dL (ref 0.4–1.5)
GFR calc Af Amer: >60 mL/min (ref >60)
GFR calc non Af Amer: >60 mL/min (ref >60)
Glucose, Bld: 118 mg/dL — ABNORMAL HIGH (ref 70–99)
Potassium: 4.3 mEq/L (ref 3.5–5.1)
Sodium: 139 mEq/L (ref 135–145)

## 2011-02-27 NOTE — Consult Note (Addendum)
  NAME:  ALP, GOLDWATER NO.:  [id number]  MEDICAL RECORD NO.:  [id number]           PATIENT TYPE:  LOCATION:                                 FACILITY:  PHYSICIAN:  Cinnamon Morency K. Elimelech Houseman, M.D.DATE OF BIRTH:  03/25/1946  DATE OF CONSULTATION:  02/12/2011 DATE OF DISCHARGE:                                CONSULTATION   The left internal carotid artery arteriogram before stent placement demonstrates wide patency of the petrous cavernous and supraclinoid, left ICA.  The left middle and cerebral artery seems to opacify normally into catheter and venous phases.  There is a diminutive hypoplastic left A1 segment.  Post stent and angioplasty images of the left internal carotid artery at the skull base demonstrates a modestly improved flow through the intra and the extracranial circulation.  Opacification of the left middle cerebral artery remains unchanged.  No changes noted in the diminutive left anterior cerebral artery A1 segment.  IMPRESSION: 1. No intracranial/intraluminal filling defects noted before and after     the placement of left internal carotid artery stent proximally. 2. Hypoplastic left A1 segment on an angiographic basis.          ______________________________ Grandville Silos Corliss Skains, M.D.     SKD/MEDQ  D:  02/26/2011  T:  02/27/2011  Job:  [id number]  Electronically Signed by Julieanne Cotton M.D. on 02/27/2011 02:53:34 PM

## 2011-02-28 NOTE — Op Note (Signed)
NAME:  [name], [name] NO.:  [id number]   MEDICAL RECORD NO.:  [id number]          PATIENT TYPE:  AMB   LOCATION:  ENDO                         FACILITY:  [facility]   PHYSICIAN:  Anselmo Rod, M.D.  DATE OF BIRTH:  12-Feb-1946   DATE OF PROCEDURE:  08/21/2004  DATE OF DISCHARGE:                                 OPERATIVE REPORT   PROCEDURE PERFORMED:  Screening colonoscopy.   ENDOSCOPIST:  Charna Elizabeth, M.D.   INSTRUMENT USED:  Olympus video colonoscope.   INDICATIONS FOR PROCEDURE:  The patient is a 65 year old white male with a  history of a stroke undergoing screening colonoscopy.  Rule out colonic  polyps, masses, etc.   PREPROCEDURE PREPARATION:  Informed consent was procured from the patient.  The patient was fasted for eight hours prior to the procedure and prepped  with a bottle of magnesium citrate and a gallon of GoLYTELY the night prior  to the procedure.  The patient was also advised to stop the Agrenox and  Ecotrin five days prior to procedure.   PREPROCEDURE PHYSICAL:  The patient had stable vital signs.  Neck supple.  Chest clear to auscultation.  S1 and S2 regular.  Abdomen soft with normal  bowel sounds.   DESCRIPTION OF PROCEDURE:  The patient was placed in left lateral decubitus  position and sedated with 50 mg of Demerol and 5 mg of Versed in slow  incremental doses.  Once the patient was adequately sedated and maintained  on low flow oxygen and continuous cardiac monitoring, the Olympus video  colonoscope was advanced from the rectum to the cecum.  The appendicular  orifice and ileocecal valve were clearly visualized and photographed.  There  was some residual stool in the colon and multiple washes were done.  Multiple washes were done.  Visualization was adequate.  Small internal  hemorrhoids were seen on retroflexion.  The rest of the exam was  unrevealing.  Terminal ileum appeared normal.   IMPRESSION:  1.  Normal colonoscopy up to the  terminal ileum except for small internal      hemorrhoids.  2.  There was some residual stool in the colon and multiple washes were      done.  3.  No masses, polyps, or diverticula were seen.   RECOMMENDATIONS:  1.  Continue high fiber diet with liberal fluid intake.  2.  Repeat colonoscopy is recommended in the next 10 years unless the      patient develops any abnormal symptoms      in the interim.  3.  Resume aspirin and Agrenox today.  4.  Outpatient followup as need arises in the future.       JNM/MEDQ  D:  08/21/2004  T:  08/21/2004  Job:  [id number]   cc:   Olene Craven, M.D.  [address]  [address]  [place]  [place] [address]  Fax: [phone]   Darlin Priestly, MD  [phone] [address]  [place]  [place] [address]  Fax: [phone]   Pramod P. Pearlean Brownie, MD  Fax: [phone]

## 2011-02-28 NOTE — Procedures (Signed)
EEG NUMBER:  [id number].   TECHNICIAN:  Deboro.   REFERRING PHYSICIAN:  Genene Churn. Love, M.D., who is also listed as the  admitting attending.   The patient is of male gender with a date of birth 07-20-1946, age  65, currently in room 58.   This is a portable EEG, according to the technician's note, performed at  the patient's bedside.  State of patient described as awake and asleep.   Neither hyperventilation nor photic stimulation maneuvers were performed  on this 16-channel EEG recording with one channel representing heart  rate and rhythm exclusively.   CURRENT MEDICATIONS:  Avapro, Toprol, Vytorin, Aggrenox, folic acid,  vitamins E and C.   This 65 year old gentleman has a history of a stroke in 1999 and was  admitted with possible TIA and seizure activity.  The patient had been  on Dilantin and was weaned off about six months ago.   A posterior dominant rhythm cannot be established in this 16-channel EEG  recording.  The EKG recording shows high amplitude discharges, seeing a  bipolar referential montage as not causing a first pace reversal and  transverse montage as showing the highest amplitude over the central  frontal areas.  This is likely epileptiform activity.  Also the clinical  observation of the technician did not indicate that she saw seizure  activity.  I am further concerned that the rhythmicity of some of these  discharges correlates with the EKG, and could be pulse artifact over a  skull defect.  Again, there is no history of skull defect given.   CONCLUSION:  This electroencephalogram needs to be repeated, probably in  the outpatient setting.  Neither history nor clinical description by the  technician show if the patient had a previous stroke in the left or  right hemisphere and which kind of deficits might have persisted.  Given  the abnormalities described above, the patient needs to continue  antiepileptic therapy.      Melvyn Novas, M.D.  Electronically Signed     ZO:XWRU  D:  11/14/2006 09:59:11  T:  11/14/2006 10:53:37  Job #:  [id number]

## 2011-02-28 NOTE — Procedures (Signed)
NAME:  [name], [name] NO.:  [id number]   MEDICAL RECORD NO.:  [id number]          PATIENT TYPE:  REC   LOCATION:  TPC                          FACILITY:  [facility]   PHYSICIAN:  Ranelle Oyster, M.D.DATE OF BIRTH:  [birth year]   DATE OF PROCEDURE:  12/02/2005  DATE OF DISCHARGE:                                 OPERATIVE REPORT   PROCEDURE:  Phenol injection, diagnostic code 342.12.   DESCRIPTION OF PROCEDURE:  After informed consent and preparation with  isopropyl alcohol, we localized the left median nerve at the elbow with  medial stimulation, ultimately titrating amplitude down to less than 1 mA,  where we still preserved significant median nerve stimulation.  After  aspiration, 5 mL of aqueous phenol solution was injected around the median  nerve.  The patient experienced immediate relaxation of the median-  innervated muscles in the hand.  No adverse effects were noted.  The patient  will continue with the stretching and range of motion at home.   Incidentally, biotech adjusted [name]'s ankle-foot orthosis, and he is  walking much more fluidly after the plantar flexion stop was removed.   I will see [name] back in approximately six months' time.      Ranelle Oyster, M.D.  Electronically Signed     ZTS/MEDQ  D:  12/02/2005 15:34:14  T:  12/03/2005 13:01:46  Job:  [id number]

## 2011-02-28 NOTE — H&P (Signed)
NAME:  [name], [name] NO.:  [id number]   MEDICAL RECORD NO.:  [id number]          PATIENT TYPE:  INP   LOCATION:  0107                         [facility]ACILITY:  [facility]   PHYSICIAN:  Genene Churn. Love, M.D.    DATE OF BIRTH:  December 19, 1945   DATE OF ADMISSION:  11/19/2006  DATE OF DISCHARGE:                              HISTORY & PHYSICAL   This is one of multiple [facility] admissions for this 65-year-  old right-handed, divorced white male from [place], [place],  seen in the [facility] emergency room for evaluation of  recurrent seizures.   HISTORY OF PRESENT ILLNESS:  Mr. 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  He had another stroke in the  anterior division of the right MCA in December, 2004.  He began having  suspected seizures in approximately 2004 and was on Dilantin until  September, 2007, at which time this medication was discontinued.  He was  admitted to [facility] in November 12, 2006 with blank stares  and in the hospital, he had a general major motor seizure and was  restarted on Dilantin medication 100 mg t.i.d. afterload.  At home, he  had seizures on Tuesday, November 16, 2006 x2, that were consider complex  partial seizures, or simple partial seizures.  He had 2 or 3 on  Wednesday, November 18, 2006, and then on Thursday, he had approximately  5-6 seizures, one at 9:00 a.m., one at 4:00 p.m., one at 5:30 p.m., one  at 7:30 p.m., one at 9:20 p.m., and one at 10:55 p.m., the latter being  a generalized major motor seizure.  The others were brief, probably  complex partial seizures.  He was seen at [facility] emergency  room, where he did not have a fever, and his Dilantin level was 19.8.   His past medical history is significant for left hemiparesis.  He has  had a right MCA stroke in 1999.  He has had a  right MCA stroke in  February, 2004.  He has had seizures secondary to #2.  He is status post  bilateral carotid endarterectomies in 1999.  He has had coronary artery  disease and is status post three vessel coronary artery bypass surgery  in January, 2004.  He has a history of pancreatitis and a history of an  ankle injury.   MEDICATIONS:  Dilantin 200 mg b.i.d., Avapro 150 mg 1/2 b.i.d., Toprol  XL 25 mg daily, Vytorin 10/80 daily, Dantrium 50 mg b.i.d., .  Tricor 48  mg daily, Aggrenox 25/200 b.i.d., __________ 1 daily, aspirin 81 mg  daily, Coenzyme Q10 50 mg daily, vitamin E 400 IU daily, vitamin C 500  mg daily, fish oil 1000 mg daily, flaxseed oil 1000 mg daily, magnesium  250 mg daily, saw palmetto 450 mg daily.   SOCIAL HISTORY:  He finished the 11th grade of school.  He worked at  [employer] and retired in 1999.   REVIEW OF SYSTEMS:  He has been receiving stimulators to his left arm by  Dr. Maryan Rued, physiatrist, and has had alcohol injections and  Botox injections into his spastic left arm.   He does not drink alcohol.  He smokes cigarettes, one pack per day.   He has no known allergies.   Family history has been dictated elsewhere.   PHYSICAL EXAMINATION:  GENERAL:  A well-developed white male, postictal,  agitated, thrashing in bed.  VITAL SIGNS:  His blood pressure, right and left arm, is 140/80.  His  heart rate was 86.  His temperature was 99 degrees, 0.2 degrees  rectally.  NEUROLOGIC:  He is alert.  He would look at the examiner.  He would not  follow commands, was not speaking.  He was agitated, moving right arm  and both legs well.  Had a spastic left hemiparesis.  His disks were  flat.  He had a hemorrhage in his right retina.  Pupils were reactive.  He did not have a definite VII.  It was hard to evaluate him, though.  He bit down for the tongue blade to check gags.  He could hear.  Motor  examination revealed that he moved right arm, right  leg, and left leg  more so than the left arm.  He had a spastic left arm with flexion  contractions of the left arm at the elbow and at the wrist.  Sensory  examination was hard to evaluate.  He did withdraw his extremities to  pain.  Deep tendon reflexes were intact and increased.  Plantar  responses were bilaterally upgoing.  HEENT:  Tympanic membranes clear.  LUNGS:  Clear.  HEART:  Examination of the heart revealed no murmurs.  ABDOMEN:  Bowel sounds were normal.   IMPRESSION:  1. Complex partial seizures.  Code 345.41.  2. Secondary generalized major motor seizures.  Code 345.10.  3. Spastic left hemiparesis.  Code 345.10.  4. Right middle cerebral artery strokes in 1999, code 434.01, with      anterior division of right middle cerebral artery stroke in      February, 2004, code 434.01.  5. Coronary artery disease, status post three vessel coronary artery      bypass graft in January, 2004.  Code 429.2.  6. Hypertension.  Code 796.2.  7. Hyperlipidemia.  Code 272.4.   The plan at this time is to being IV Keppra, continue Dilantin, obtain a  neuro ICU bed admission, and get a CT scan of the brain as quickly as  possible for evaluation.  He also needs blood studies since none have  been obtained at this time other than his Dilantin level.           ______________________________  Genene Churn. Sandria Manly, M.D.     JML/MEDQ  D:  11/20/2006  T:  11/20/2006  Job:  [id number]   cc:   Olene Craven, M.D.  Fax: [phone]   Ranelle Oyster, M.D.  Fax: [phone]   Pramod P. Pearlean Brownie, MD  Fax: [phone]   Darlin Priestly, MD  Fax: [phone]

## 2011-02-28 NOTE — Discharge Summary (Signed)
NAME:  [name], [name]               ACCOUNT NO.:  [id number]   MEDICAL RECORD NO.:  [id number]          PATIENT TYPE:  INP   LOCATION:  3020                         FACILITY:  [facility]   PHYSICIAN:  Pramod P. Pearlean Brownie, MD    DATE OF BIRTH:  12/14/1945   DATE OF ADMISSION:  11/20/2006  DATE OF DISCHARGE:  11/22/2006                               DISCHARGE SUMMARY   ADMISSION DIAGNOSIS:  Seizures.   DISCHARGE DIAGNOSES:  1. Breakthrough partial complex seizure with secondary generalized      symptomatic from old brain strokes x2.  2. Right middle cerebral artery infarct in 1999 as well as anterior      division diagnosis of cerebral artery infarct in February 2004.  3. Previous generalized tonic clonic seizures.  4. Hypertension.  5. Hyperlipidemia.   HOSPITAL COURSE:  [name] is a pleasant 65 year old Caucasian male  who is well known to the stroke service on previous admissions.  He has  had a large right middle cerebral infarct in 1999 with post __________  spastic left hemiparesis.  He had another stroke in December of 2004 in  the anterior division of the right middle cerebral artery.  He  subsequently was suspected of having seizures, started on Dilantin which  he took until 2007, he stopped it.  In January 2008, he was readmitted  with episodes of blank stares followed by generalized motor seizure.  He  was restarted on Dilantin 100 mg three times a day which he took but on  November 16, 2006, he had two episodes of possible staring with some  automatism in his hands.  He had several more episodes on November 18, 2006 as well as one day of admission.  He had 5-6 such episodes.  EMS  was called and in the ambulance he had a witnessed generalized tonic  clonic seizure.  His dilantin level was found to be optimal on  admission.  Hence he was started on Keppra for added seizure  prophylaxis.  He did well and remained seizure free during the  hospitalization.  He was seen in  consultation by physical and  occupational therapy and was felt to be able to discharged along with  home therapy.  On the day of discharge, he is awake, alert, interactive.  He had old residual left spastic hemiparesis which is unchanged from his  baseline.  His admission labs revealed normal electrolytes as well as  WBC count.  Cardiac enzymes are negative for acute ischemia.  Urine drug  screen was negative.  He had CT scan of the head done on admission which  showed old infarct in the right middle cerebral artery territory with  dilatation on lateral ventricle without any acute abnormality.  Chest x-  ray shows no acute abnormality.   DISCHARGE MEDICATIONS:  1. Keppra 500 twice a day.  2. Dilantin 100 three times a day.  3. Aggrenox one capsule twice a daily.  4. __________  25 twice a day.  5. Tricor 48 mg a day.  6. Vytorin 10/80 daily.  7. Toprol XL 25 daily.  8.  Avapro 75 twice a day.  9. Magnesium oxide 250 daily.  10.Vitamin C 500 daily.  11.Vitamin E 400 daily.  12.Fish oil 1 g daily.  13.Coenzyme Q10 50 mg daily.   Patient was advised to have home health physical therapy and use a cane  to walk.  He was advise to avoid seizure provoking factors like sleep  depravation, irregular eating habits, severe physical exertion.  Patient  had been started on new device for his left hand by rehab which was an  electrical stimulating device.  This had been associated with seizure  risk amongst the warnings which came with the device and he was asked to  discontinue that discuss this further with his rehab physician, Dr.  Thersa Salt. He had an appointment with Dr. Pearlean Brownie in  March and he was  advised to continue that.           ______________________________  Sunny Schlein. Pearlean Brownie, MD     PPS/MEDQ  D:  11/22/2006  T:  11/23/2006  Job:  [id number]   cc:   Alphonsus Sias

## 2011-03-04 NOTE — Op Note (Signed)
NAME:  [name], [name]               ACCOUNT NO.:  [id number]  MEDICAL RECORD NO.:  [id number]           PATIENT TYPE:  I  LOCATION:  3310                         FACILITY:  [facility]  PHYSICIAN:  Juleen China IV, MDDATE OF BIRTH:  1946/03/20  DATE OF PROCEDURE:  02/26/2011 DATE OF DISCHARGE:                              OPERATIVE REPORT   PREOPERATIVE DIAGNOSIS:  Recurrent right carotid stenosis.  POSTOPERATIVE DIAGNOSIS:  Recurrent right carotid stenosis.  PROCEDURE PERFORMED: 1. Ultrasound access right femoral artery. 2. Right carotid stent with distal embolic protection.  SURGEON: 1. Charlena Cross, MD  CO-SURGEON:  Nanetta Batty, MD  INDICATION:  Mr. [name] is a 65 year old gentleman who suffered a right brain stroke and has undergone bilateral carotid endarterectomies in [place].  He comes in with a recurrent bilateral stenosis.  He has already undergone left carotid stenting successfully.  He comes back today to have his right side addressed.  PROCEDURE:  The patient was identified in the holding area and taken to room A, placed supine on table.  Both groins were prepped and draped in usual fashion.  Time-out was called.  Ultrasound was used to evaluate the right femoral artery, it was found be widely patent with mild calcification.  A digital ultrasound image was obtained.  The right common femoral artery was then accessed with a micropuncture needle under ultrasound guidance.  Then, micropuncture wire was then advanced without resistance.  The micropuncture sheath was placed.  This was followed by an 0.035 Bentson wire and a 6-French long sheath was advanced over the wire up into the descending thoracic aorta.  Angiomax was administered and it was checked with ACT.  Once the ACT was appropriate, using a JB1 catheter and Bentson wire, the right common carotid artery was selected and the sheath was advanced into the right common carotid artery.  Carotid and  intracranial images were then obtained in AP and lateral projections and images will be read separately by Neurointerventional Radiology.  The right carotid angiogram confirmed greater than 80% stenosis as seen previously.  At this point, a 6-mm Angioguard filter was prepared on the back table, then loaded through the sheath, it was advanced across the stenosis without resistance and placed in the straight portion of the internal carotid artery.  Next, the stenosis was predilated with a 3 x 2 balloon taken to nominal pressure with no hemodynamic changes.  We then prepared the stent on the back table.  This was a PRECISE PRO RX 9 x 30.  It was advanced across the stenosis and then deployed beginning in the internal carotid artery and extending into the common carotid artery.  A 0.5 mg of atropine was then administered and the stent was molded using a 5 x 5 balloon taken to nominal pressure.  Completion angiogram was then performed which showed resolution of the stenosis within the right carotid artery.  Intracranial images were unchanged.  The filter was then removed using the removal device.  The sheath was exchanged out for a short 6-French sheath.  The patient was taken to the holding area in stable condition.  He remained  neurologically intact throughout the entire case.  IMPRESSION:  Successful right carotid stent with distal embolic protection using a 9 x 30 PRECISE PRO RX stent and a 6-mm Angioguard filter.     Jorge Ny, MD     VWB/MEDQ  D:  02/26/2011  T:  02/26/2011  Job:  [id number]  cc:   Nanetta Batty, M.D. Larina Earthly, M.D.  Electronically Signed by Arelia Longest IV MD on 03/04/2011 10:11:15 PM

## 2011-03-31 ENCOUNTER — Ambulatory Visit: Payer: Medicare Other | Admitting: Surgery

## 2011-03-31 ENCOUNTER — Other Ambulatory Visit: Payer: Medicare Other

## 2011-04-02 ENCOUNTER — Encounter: Payer: Medicare Other | Attending: Physical Medicine & Rehabilitation | Admitting: Physical Medicine & Rehabilitation

## 2011-04-02 DIAGNOSIS — G40909 Epilepsy, unspecified, not intractable, without status epilepticus: Secondary | ICD-10-CM | POA: Insufficient documentation

## 2011-04-02 DIAGNOSIS — I6330 Cerebral infarction due to thrombosis of unspecified cerebral artery: Secondary | ICD-10-CM

## 2011-04-02 DIAGNOSIS — I69959 Hemiplegia and hemiparesis following unspecified cerebrovascular disease affecting unspecified side: Secondary | ICD-10-CM | POA: Insufficient documentation

## 2011-04-02 DIAGNOSIS — I10 Essential (primary) hypertension: Secondary | ICD-10-CM

## 2011-04-02 DIAGNOSIS — G8110 Spastic hemiplegia affecting unspecified side: Secondary | ICD-10-CM

## 2011-04-03 NOTE — Assessment & Plan Note (Signed)
[name] is back regarding spastic left hemiparesis.  He really done well with the phenol therapy.  He has not been back to the gym and been working out lately.  He says he has had some problems with carotid stenosis and recently, he had stenting done.  He wants to get back in his pool again.  Otherwise, he has generally been stable.  REVIEW OF SYSTEMS:  Notable for weakness.  Full 12-point review is in the written health and history section of the chart.  SOCIAL HISTORY:  As noted above.  He lives with his ex-wife who is very supportive of him.  PHYSICAL EXAMINATION:  VITAL SIGNS:  Blood pressure is 135/58, pulse 60, respiratory rate 18.  He is satting 98% on room air. GENERAL:  The patient is pleasant, alert. EXTREMITIES:  He tends to walk with the left knee hyperextended, but his gait with stride length is improved.  Left hand is much more easily to move in range today.  He does tend to clench up at the metacarpal phalangeal joints at times and the wrist becomes tight too.  He can relax given time.  He has trace motor strength in that left hand and wrist and 1-2/5 more proximally.  He remains 2/5 in the left lower extremity except at the ankle, which is trace to 1.  Sensation is grossly intact in the left side.  He remains hyperreflexic throughout the left arm and leg with reflex testing. HEART:  Regular. CHEST:  Clear. ABDOMEN:  Soft, nontender.  ASSESSMENT: 1. Right cerebrovascular accident, spastic left hemiparesis. 2. Seizure disorder.  PLAN: 1. Continue with exercise and range of motion once he is given the     clearance to do so. 2. Recommended using his left wrist hand orthosis to help provide     extra stretch to his hand, wrist and upper extremity in general. 3. Dantrium will be continued for spasticity management. 4. I will see him back here in about a year.  Call with any problems     or questions.  In general, he is doing quite well at this time.     Ranelle Oyster, M.D. Electronically Signed    ZTS/MedQ D:  04/02/2011 14:26:37  T:  04/03/2011 05:12:16  Job #:  [id number]

## 2011-04-07 ENCOUNTER — Ambulatory Visit (INDEPENDENT_AMBULATORY_CARE_PROVIDER_SITE_OTHER): Payer: Medicare Other | Admitting: Surgery

## 2011-04-07 ENCOUNTER — Other Ambulatory Visit (INDEPENDENT_AMBULATORY_CARE_PROVIDER_SITE_OTHER): Payer: Medicare Other

## 2011-04-07 DIAGNOSIS — Z48812 Encounter for surgical aftercare following surgery on the circulatory system: Secondary | ICD-10-CM

## 2011-04-07 DIAGNOSIS — I6529 Occlusion and stenosis of unspecified carotid artery: Secondary | ICD-10-CM

## 2011-04-08 NOTE — Assessment & Plan Note (Signed)
OFFICE VISIT  [name], [name] DOB:  06-17-1946                                       04/07/2011 EAVWU#:[id number]  REASON FOR VISIT:  Followup carotid stenting.  HISTORY:  This is a 65 year old gentleman who is status post bilateral carotid endarterectomies in [place], [place], after an acute stroke in 1999.  Duplex ultrasound revealed bilateral high-grade stenoses.  This was confirmed with cerebral angiography on 04/11.  After extensive discussions between Dr. Arbie Cookey and myself and the patient and his wife we decided to proceed with carotid stenting as a post redo endarterectomy for bilateral greater than 80% recurrent stenosis.  The patient has undergone successful bilateral staged carotid stenting.  He has done well without acute events.  He is back today for followup.  PHYSICAL EXAMINATION:  He is afebrile, hemodynamically stable.  Groin is soft.  Respirations nonlabored.  No carotid bruits.  Neurologic exam is at baseline.  Pedal pulses are not palpable.  DIAGNOSTIC STUDIES:  Duplex ultrasound was performed today which shows patent bilateral carotid stents at endarterectomy sites without evidence of hemodynamically significant stenosis.  These are significantly improved from pre intervention.  ASSESSMENT AND PLAN:  Status post bilateral carotid stents.  I had initially switched him off of Aggrenox to Plavix as there is no data on Aggrenox with carotid stenting.  I would like for him to remain on Plavix for at least 3 months and then I will defer to Dr. Sandria Manly if he would like to be switched back to Aggrenox that would be fine.  The patient will continue with the surveillance ultrasound protocol.  The next exam will be in 6 months.  I will see him at that time.    Jorge Ny, MD Electronically Signed  VWB/MEDQ  D:  04/07/2011  [name]:  04/08/2011  Job:  [id number]  cc:   Genene Churn. Love, M.D. Richard A. Alanda Amass, M.D.

## 2011-04-17 NOTE — Procedures (Unsigned)
CAROTID DUPLEX EXAM  INDICATION:  Carotid stents.  HISTORY: Diabetes:  No. Cardiac:  CABG. Hypertension:  Yes. Smoking:  Previous. Previous Surgery:  Bilateral carotid endarterectomies in 1999 in [place].  Left carotid stent on 02/12/2011, right carotid stent on 02/26/2011. CV History:  History of CVA. Amaurosis Fugax No, Paresthesias No, Hemiparesis No                                      RIGHT             LEFT Brachial systolic pressure:         124               120 Brachial Doppler waveforms:         Normal            Normal Vertebral direction of flow:        Antegrade         Antegrade DUPLEX VELOCITIES (cm/sec) CCA peak systolic                   79                80 ECA peak systolic                   161               81 ICA peak systolic                   40                65 ICA end diastolic                   10                25 PLAQUE MORPHOLOGY:                  Heterogeneous     Heterogeneous PLAQUE AMOUNT:                      Mild              Mild PLAQUE LOCATION:                    CCA               CCA  IMPRESSION: 1. Patent bilateral carotid stents and endarterectomy sites with no     hemodynamically significant stenoses noted. 2. Significant improvement of the bilateral carotid artery velocities     when compared to the previous exam on 01/14/2011.  ___________________________________________ V. Charlena Cross, MD  CH/MEDQ  D:  04/08/2011  T:  04/08/2011  Job:  [id number]

## 2011-04-27 NOTE — Discharge Summary (Signed)
NAME:  [name], [name]               ACCOUNT NO.:  [id number]  MEDICAL RECORD NO.:  [id number]  LOCATION:                                 FACILITY:  PHYSICIAN:  Juleen China IV, MDDATE OF BIRTH:  Dec 11, 1945  DATE OF ADMISSION: DATE OF DISCHARGE:                              DISCHARGE SUMMARY   ADMIT DIAGNOSIS:  Recurrent internal carotid artery stenosis.  PAST MEDICAL HISTORY AND DISCHARGE DIAGNOSES: 1. Recurrent right carotid stenosis status post right carotid stent. 2. Bilateral carotid endarterectomies. 3. Status post left carotid stent in the past. 4. Hypertension. 5. Hyperlipidemia. 6. Cerebrovascular accident. 7. Transient ischemic attack. 8. Coronary artery disease status post coronary artery bypass graft.  BRIEF HISTORY:  The patient is a 65 year old male who suffered a right brain stroke and had undergone bilateral carotid endarterectomies in [place] in the past.  The patient presented to Dr. Myra Gianotti with recurrent bilateral stenoses.  He had already undergone a left carotid stenting successfully and the patient was currently scheduled for a right carotid stent.  HOSPITAL COURSE:  The patient was admitted and taken to the peripheral vascular lab on Feb 26, 2011 for a right carotid stent by Dr. Myra Gianotti. The patient tolerated the procedure well and was hemodynamically stable immediately post procedure.  The patient was transferred to the floor without difficulty.  The patient's postoperative course progressed as expected.  On postop day 1, he was without complaint.  He was tolerating a regular diet, ambulation, and voiding all without difficulty.  The patient remained afebrile with stable vital signs throughout the hospital course.  PHYSICAL EXAMINATION:  CARDIAC:  Regular rate and rhythm. LUNGS:  Clear to auscultation. PELVIS:  Right groin soft with no evidence of hematoma. NEUROLOGIC:  The patient was at his baseline.  He has no use of his left upper  extremity.  The left lower extremity is 3/5 in strength and the right upper and lower extremity with 5/5 in strength.  Tongue was midline and his speech was normal.  There was a slight left facial droop which was not changed from his preoperative status.  The patient was doing well and felt ready for discharge home on aspirin and Plavix.  LABORATORY DATA:  CBC and BMP on Feb 27, 2011, white count 9.6, hemoglobin 12.3, hematocrit 35.9, platelets 135.  Sodium 139, potassium 4.3, BUN 16, creatinine of 0.97.  The patient received specific written discharge instructions regarding diet, activity, and wound care.  He was to follow up with Dr. Myra Gianotti in 4 weeks with a repeat carotid duplex.  DISCHARGE MEDICATIONS: 1. Zetia 10 mg daily. 2. Vitamin C 500 mg daily. 3. Plavix 75 mg daily. 4. Metoprolol 25 mg daily. 5. Magnesium oxide 250 mg daily. 6. Keppra 500 mg b.i.d. 7. Folbic 2.5/25/2 mg at bedtime. 8. Flaxseed oil 1000 mg day. 9. Fish oil 1000 mg daily. 10.Dilantin 100 mg extended release t.i.d. 11.Dantrium 50 mg b.i.d. 12.Crestor 20 mg one and half tablets daily. 13.CoQ10 50 mg daily. 14.Benicar 20 mg 1/2 tablet daily. 15.Aspirin 81 mg daily.     Pecola Leisure, PA   ______________________________ V. Charlena Cross, MD    AY/MEDQ  D:  03/13/2011  T:  03/13/2011  Job:  [id number]  Electronically Signed by Pecola Leisure PA on 03/20/2011 08:56:02 AM Electronically Signed by Arelia Longest IV MD on 04/27/2011 10:57:12 PM

## 2011-05-22 ENCOUNTER — Emergency Department (HOSPITAL_COMMUNITY): Payer: Medicare Other

## 2011-05-22 ENCOUNTER — Inpatient Hospital Stay (HOSPITAL_COMMUNITY)
Admission: EM | Admit: 2011-05-22 | Discharge: 2011-05-23 | DRG: 439 | Disposition: A | Discharge status: Home / Self Care | Payer: Medicare Other | Attending: Internal Medicine | Admitting: Internal Medicine

## 2011-05-22 DIAGNOSIS — I69959 Hemiplegia and hemiparesis following unspecified cerebrovascular disease affecting unspecified side: Secondary | ICD-10-CM

## 2011-05-22 DIAGNOSIS — Z7982 Long term (current) use of aspirin: Secondary | ICD-10-CM

## 2011-05-22 DIAGNOSIS — I10 Essential (primary) hypertension: Secondary | ICD-10-CM | POA: Diagnosis present

## 2011-05-22 DIAGNOSIS — G40909 Epilepsy, unspecified, not intractable, without status epilepticus: Secondary | ICD-10-CM | POA: Diagnosis present

## 2011-05-22 DIAGNOSIS — I2510 Atherosclerotic heart disease of native coronary artery without angina pectoris: Secondary | ICD-10-CM | POA: Diagnosis present

## 2011-05-22 DIAGNOSIS — I700 Atherosclerosis of aorta: Secondary | ICD-10-CM | POA: Diagnosis present

## 2011-05-22 DIAGNOSIS — K859 Acute pancreatitis without necrosis or infection, unspecified: Principal | ICD-10-CM | POA: Diagnosis present

## 2011-05-22 DIAGNOSIS — D72829 Elevated white blood cell count, unspecified: Secondary | ICD-10-CM | POA: Diagnosis present

## 2011-05-22 DIAGNOSIS — Z79899 Other long term (current) drug therapy: Secondary | ICD-10-CM

## 2011-05-22 DIAGNOSIS — I7409 Other arterial embolism and thrombosis of abdominal aorta: Secondary | ICD-10-CM | POA: Diagnosis present

## 2011-05-22 DIAGNOSIS — Z951 Presence of aortocoronary bypass graft: Secondary | ICD-10-CM

## 2011-05-22 DIAGNOSIS — Z7902 Long term (current) use of antithrombotics/antiplatelets: Secondary | ICD-10-CM

## 2011-05-22 LAB — COMPREHENSIVE METABOLIC PANEL
ALT: 24 U/L (ref 0–53)
AST: 27 U/L (ref 0–37)
Albumin: 3.6 g/dL (ref 3.5–5.2)
Alkaline Phosphatase: 101 U/L (ref 39–117)
BUN: 17 mg/dL (ref 6–23)
CO2: 26 mEq/L (ref 19–32)
Calcium: 9.3 mg/dL (ref 8.4–10.5)
Chloride: 100 mEq/L (ref 96–112)
Creatinine, Ser: 0.9 mg/dL (ref 0.50–1.35)
GFR calc Af Amer: >60 mL/min (ref >60)
GFR calc non Af Amer: >60 mL/min (ref >60)
Glucose, Bld: 177 mg/dL — ABNORMAL HIGH (ref 70–99)
Potassium: 4.6 mEq/L (ref 3.5–5.1)
Sodium: 135 mEq/L (ref 135–145)
Total Bilirubin: 0.2 mg/dL — ABNORMAL LOW (ref 0.3–1.2)
Total Protein: 6.9 g/dL (ref 6.0–8.3)

## 2011-05-22 LAB — CBC
HCT: 41.3 % (ref 39.0–52.0)
Hemoglobin: 14.4 g/dL (ref 13.0–17.0)
MCH: 33 pg (ref 26.0–34.0)
MCHC: 34.9 g/dL (ref 30.0–36.0)
MCV: 94.7 fL (ref 78.0–100.0)
Platelets: 131 10*3/uL — ABNORMAL LOW (ref 150–400)
RBC: 4.36 MIL/uL (ref 4.22–5.81)
RDW: 12.7 % (ref 11.5–15.5)
WBC: 10.6 10*3/uL — ABNORMAL HIGH (ref 4.0–10.5)

## 2011-05-22 LAB — URINALYSIS, ROUTINE W REFLEX MICROSCOPIC
Bilirubin Urine: NEGATIVE
Glucose, UA: NEGATIVE mg/dL
Hgb urine dipstick: NEGATIVE
Ketones, ur: NEGATIVE mg/dL
Leukocytes, UA: NEGATIVE
Nitrite: NEGATIVE
Protein, ur: NEGATIVE mg/dL
Specific Gravity, Urine: 1.014 (ref 1.005–1.030)
Urobilinogen, UA: 0.2 mg/dL (ref 0.0–1.0)
pH: 7.5 (ref 5.0–8.0)

## 2011-05-22 LAB — CK TOTAL AND CKMB (NOT AT ARMC)
CK, MB: 2.6 ng/mL (ref 0.3–4.0)
Relative Index: INVALID (ref 0.0–2.5)
Total CK: 58 U/L (ref 7–232)

## 2011-05-22 LAB — DIFFERENTIAL
Basophils Absolute: 0 10*3/uL (ref 0.0–0.1)
Basophils Relative: 0 % (ref 0–1)
Eosinophils Absolute: 0.2 10*3/uL (ref 0.0–0.7)
Eosinophils Relative: 1 % (ref 0–5)
Lymphocytes Relative: 14 % (ref 12–46)
Lymphs Abs: 1.5 10*3/uL (ref 0.7–4.0)
Monocytes Absolute: 1 10*3/uL (ref 0.1–1.0)
Monocytes Relative: 10 % (ref 3–12)
Neutro Abs: 7.9 10*3/uL — ABNORMAL HIGH (ref 1.7–7.7)
Neutrophils Relative %: 75 % (ref 43–77)

## 2011-05-22 LAB — CARDIAC PANEL(CRET KIN+CKTOT+MB+TROPI)
CK, MB: 2.7 ng/mL (ref 0.3–4.0)
Relative Index: INVALID (ref 0.0–2.5)
Total CK: 56 U/L (ref 7–232)
Troponin I: <0.3 ng/mL (ref <0.30)

## 2011-05-22 LAB — PRO B NATRIURETIC PEPTIDE: Pro B Natriuretic peptide (BNP): 159.5 pg/mL — ABNORMAL HIGH (ref 0–125)

## 2011-05-22 LAB — LIPASE, BLOOD: Lipase: 494 U/L — ABNORMAL HIGH (ref 11–59)

## 2011-05-22 LAB — LACTATE DEHYDROGENASE: LDH: 164 U/L (ref 94–250)

## 2011-05-22 LAB — POCT I-STAT TROPONIN I: Troponin i, poc: 0.01 ng/mL (ref 0.00–0.08)

## 2011-05-22 LAB — MAGNESIUM: Magnesium: 2 mg/dL (ref 1.5–2.5)

## 2011-05-22 LAB — APTT: aPTT: 28 seconds (ref 24–37)

## 2011-05-22 LAB — PROTIME-INR
INR: 1.03 (ref 0.00–1.49)
Prothrombin Time: 13.7 seconds (ref 11.6–15.2)

## 2011-05-22 MED ORDER — IOHEXOL 300 MG/ML  SOLN
80.0000 mL | Freq: Once | INTRAMUSCULAR | Status: AC | PRN
  Administered 2011-05-22: 80 mL via INTRAVENOUS

## 2011-05-23 DIAGNOSIS — I7411 Embolism and thrombosis of thoracic aorta: Secondary | ICD-10-CM

## 2011-05-23 LAB — CBC
HCT: 40.5 % (ref 39.0–52.0)
Hemoglobin: 14.6 g/dL (ref 13.0–17.0)
MCH: 33.1 pg (ref 26.0–34.0)
MCHC: 36 g/dL (ref 30.0–36.0)
MCV: 91.8 fL (ref 78.0–100.0)
Platelets: 118 10*3/uL — ABNORMAL LOW (ref 150–400)
RBC: 4.41 MIL/uL (ref 4.22–5.81)
RDW: 12.5 % (ref 11.5–15.5)
WBC: 12.4 10*3/uL — ABNORMAL HIGH (ref 4.0–10.5)

## 2011-05-23 LAB — DIFFERENTIAL
Basophils Absolute: 0 10*3/uL (ref 0.0–0.1)
Basophils Relative: 0 % (ref 0–1)
Eosinophils Absolute: 0.1 10*3/uL (ref 0.0–0.7)
Eosinophils Relative: 1 % (ref 0–5)
Lymphocytes Relative: 14 % (ref 12–46)
Lymphs Abs: 1.7 10*3/uL (ref 0.7–4.0)
Monocytes Absolute: 1.4 10*3/uL — ABNORMAL HIGH (ref 0.1–1.0)
Monocytes Relative: 11 % (ref 3–12)
Neutro Abs: 9.1 10*3/uL — ABNORMAL HIGH (ref 1.7–7.7)
Neutrophils Relative %: 74 % (ref 43–77)

## 2011-05-23 LAB — CARDIAC PANEL(CRET KIN+CKTOT+MB+TROPI)
CK, MB: 2.3 ng/mL (ref 0.3–4.0)
CK, MB: 2.3 ng/mL (ref 0.3–4.0)
Relative Index: INVALID (ref 0.0–2.5)
Relative Index: INVALID (ref 0.0–2.5)
Total CK: 50 U/L (ref 7–232)
Total CK: 57 U/L (ref 7–232)
Troponin I: <0.3 ng/mL (ref <0.30)
Troponin I: <0.3 ng/mL (ref <0.30)

## 2011-05-23 LAB — LIPID PANEL
Cholesterol: 145 mg/dL (ref 0–200)
HDL: 41 mg/dL (ref >39)
LDL Cholesterol: 82 mg/dL (ref 0–99)
Total CHOL/HDL Ratio: 3.5 RATIO
Triglycerides: 112 mg/dL (ref <150)
VLDL: 22 mg/dL (ref 0–40)

## 2011-05-23 LAB — TSH: TSH: 1.819 u[IU]/mL (ref 0.350–4.500)

## 2011-05-23 LAB — VITAMIN B12: Vitamin B-12: 1948 pg/mL — ABNORMAL HIGH (ref 211–911)

## 2011-05-23 LAB — COMPREHENSIVE METABOLIC PANEL
ALT: 21 U/L (ref 0–53)
AST: 20 U/L (ref 0–37)
Albumin: 3.4 g/dL — ABNORMAL LOW (ref 3.5–5.2)
Alkaline Phosphatase: 107 U/L (ref 39–117)
BUN: 13 mg/dL (ref 6–23)
CO2: 23 mEq/L (ref 19–32)
Calcium: 8.9 mg/dL (ref 8.4–10.5)
Chloride: 98 mEq/L (ref 96–112)
Creatinine, Ser: 0.8 mg/dL (ref 0.50–1.35)
GFR calc Af Amer: >60 mL/min (ref >60)
GFR calc non Af Amer: >60 mL/min (ref >60)
Glucose, Bld: 147 mg/dL — ABNORMAL HIGH (ref 70–99)
Potassium: 4.2 mEq/L (ref 3.5–5.1)
Sodium: 130 mEq/L — ABNORMAL LOW (ref 135–145)
Total Bilirubin: 0.4 mg/dL (ref 0.3–1.2)
Total Protein: 6.5 g/dL (ref 6.0–8.3)

## 2011-05-23 LAB — FERRITIN: Ferritin: 84 ng/mL (ref 22–322)

## 2011-05-23 LAB — MAGNESIUM: Magnesium: 1.8 mg/dL (ref 1.5–2.5)

## 2011-05-23 LAB — LIPASE, BLOOD: Lipase: 132 U/L — ABNORMAL HIGH (ref 11–59)

## 2011-05-23 LAB — HEMOGLOBIN A1C
Hgb A1c MFr Bld: 6.5 % — ABNORMAL HIGH (ref <5.7)
Mean Plasma Glucose: 140 mg/dL — ABNORMAL HIGH (ref <117)

## 2011-05-23 LAB — PHOSPHORUS: Phosphorus: 2.6 mg/dL (ref 2.3–4.6)

## 2011-05-26 LAB — FOLATE RBC: RBC Folate: 1636 ng/mL — ABNORMAL HIGH (ref >=366)

## 2011-05-27 NOTE — Discharge Summary (Signed)
NAMEMarland Kitchen  KINSEY, COWSERT NO.:  [id number]  MEDICAL RECORD NO.:  [id number]  LOCATION:  4735                         FACILITY:  [facility]  PHYSICIAN:  Lonia Blood, M.D.       DATE OF BIRTH:  04-05-1946  DATE OF ADMISSION:  05/22/2011 DATE OF DISCHARGE:  05/23/2011                              DISCHARGE SUMMARY   PRIMARY CARE PHYSICIAN:  Jasmine December A. Paulino Rily, MD  DISCHARGE DIAGNOSES: 1. Probable mild acute pancreatitis, resolved. 2. Nausea, vomiting, abdominal pain brief episode, resolved - unclear     etiology. 3. Presence of a 2.7 x 1.8 ill-defined mass in the left upper quadrant     of the abdomen, unclear etiology, probably related to the patient's     old episode of pancreatitis, requires followup CT scan in 3-6     months. 4. Extensive atherosclerosis with coronary artery disease status     coronary bypass grafting, bilateral carotid artery stenosis with     bilateral stenting as well as atherosclerosis of the aorta with     mural thrombus.  DISCHARGE MEDICATIONS: 1. Tylenol 650 mg by mouth every 4 hours as needed for pain. 2. Aggrenox 25/200 one tablet twice a day. 3. Aspirin 81 mg daily. 4. Benicar 10 mg daily. 5. Coenzyme-Q 50 mg daily. 6. Crestor 20 mg 1-1/2 tablet daily. 7. Dantrolene 50 mg twice a day. 8. Dilantin 100 mg 3 times a day. 9. Fish oil 1000 mg daily. 10.Flaxseed oil 1000 mg daily. 11.Folbic 2.5/25 two daily. 12.GI cocktail 10 mL three times daily as needed for abdominal pain. 13.Keppra 500 mg twice a day. 14.Magnesium oxide 250 mg daily. 15.Metoprolol 25 mg daily. 16.Omeprazole 20 mg daily. 17.Vitamin C 500 mg daily. 18.Zetia 10 mg daily.  CONDITION ON DISCHARGE:  Ms. Ciano was discharged in good condition. He was tolerating regular diet, alert, oriented.  Vital Signs: Temperature 98.3, heart rate 62, respirations 18, blood pressure 127/67, and saturation 98% on room air.  He was discharged under the care of his ex-wife.  He will  follow up with his primary care physician Dr. Mila Palmer.  PROCEDURE DURING THIS ADMISSION:  The patient's CT scan abdomen and pelvis findings of abnormal left upper quadrant process both inflammatory process consistent with history of pseudocyst, extensive atherosclerosis with mural thrombus in the abdominal aorta.  CONSULTATIONS DURING THIS ADMISSION:  The patient was seen by Dr. Fabienne Bruns, from Vascular Surgery who felt that he does not require any intervention for his thrombus.  HISTORY AND PHYSICAL:  Refer dictated H and P done by Dr. Noel Gerold.  HOSPITAL COURSE:  Mr. Hidalgo is a 65 year old gentleman with a widespread atherosclerosis, history of severe pancreatitis with pseudocyst like formation, presented to emergency room with some nausea, vomiting and abdominal pain.  He had a mildly elevated lipase.  It was felt that he probably has an episode of mild pancreatitis.  He was placed n.p.o. on IV fluids.  His diet was slowly advanced and after clear liquid breakfast, the patient had a low-fat regular diet.  He felt well without any recurrence of his nausea, vomiting and abdominal pain, who was able to ambulate without problems and was discharged  home to follow up with his primary care physician.     Lonia Blood, M.D.     SL/MEDQ  D:  05/23/2011  T:  05/23/2011  Job:  [id number]  cc:   Emeterio Reeve, MD  Electronically Signed by Lonia Blood M.D. on 05/27/2011 05:45:47 PM

## 2011-06-12 NOTE — Consult Note (Signed)
NAMEMarland Kitchen  KNOX, CERVI NO.:  [id number]  MEDICAL RECORD NO.:  [id number]  LOCATION:  4735                         FACILITY:  [facility]  PHYSICIAN:  Janetta Hora. Fields, MD  DATE OF BIRTH:  [birth year]  DATE OF CONSULTATION: DATE OF DISCHARGE:                                CONSULTATION   REASON FOR CONSULTATION:  CT scan revealing mural thrombus in the abdominal aorta.  HISTORY OF PRESENT ILLNESS:  The patient is a 65 year old male with past medical history significant for hypertension, CVA with left-sided paresis, CAD and pancreatitis, who was admitted yesterday for left upper quadrant pain and increased blood pressures.  The patient stated this pain was located in the epigastrium in the left upper quadrant.  He was having some nausea but no vomiting.  He denied diarrhea and constipation.  There was no radiation of his pain.  He denied fever and chills.  The patient also denied symptoms of TIA, CVA as well as claudication and rest pain in his feet.  He denied any new pain in his toes or fingers as well as any discoloration.  The patient denied chest pain, shortness of breath.  Secondary to the left upper quadrant pain and history of pancreatitis as well as hypertension, the patient was admitted for control of these issues.  CT scan was performed and this revealed an abnormal left quadrant process which has an appearance suggesting a postinflammatory process correlated with the given history of pseudocyst.  This also revealed extensive atherosclerosis with irregular mural thrombus throughout the abdominal aorta with no evidence of bowel obstruction or pelvic inflammatory process.  Secondary to the evidence of thrombus, the Vascular Surgery service was consulted. The patient is currently resting comfortably in the chair and denies all abdominal pain, nausea, vomiting and chest pain.  The patient is recently status post carotid stenting on the right by Dr. Myra Gianotti on  Feb 26, 2011.  The patient did well from this standpoint and was seen in follow up on June 25th again without complaint or issue.  He has been on Aggrenox for this.  PAST MEDICAL HISTORY: 1. Hypertension. 2. Coronary artery disease status post CABG. 3. CVA with persistent left-sided weakness and spasticity. 4. Seizure disorder. 5. Bilateral carotid endarterectomy as well as right carotid stenting. 6. Pancreatitis pseudocyst. 7. Cholecystectomy. 8. Hyperlipidemia.  FAMILY HISTORY:  Cerebral bleeding.  SOCIAL HISTORY:  The patient denies alcohol and tobacco use.  ALLERGIES:  No known drug allergies.  OUTPATIENT MEDICATIONS: 1. Co-Q 10 50 mg daily. 2. Zetia 10 mg daily. 3. Metoprolol 25 mg daily. 4. Magnesium oxide 250 mg OTC daily. 5. Keppra 500 mg b.i.d. 6. Folbic 2.5/25/2 mg one nightly. 7. Vitamin C 500 mg daily. 8. Flaxseed oil 1000 mg daily. 9. Fish oil 1000 mg daily. 10.Dilantin 100 mg p.o. t.i.d. 11.Dantrolene 50 mg b.i.d. 12.Crestor 20 mg 1.5 tablets daily. 13.Benicar 20 mg daily. 14.Aspirin 81 mg daily 15.Aggrenox 25/200 mg daily  REVIEW OF SYSTEMS:  Negative except as per HPI.  PHYSICAL EXAMINATION:  VITAL SIGNS:  Blood pressure 121/76, pulse 69, temperature 98.9, O2 sat 95% on room air. GENERAL:  This is a white male comfortable in no  acute distress. HEENT:  PERLA.  EOMI.  Normocephalic, atraumatic. CARDIAC:  Regular rate and rhythm with no murmurs noted. LUNGS:  Clear to auscultation. ABDOMEN:  Soft, nontender, nondistended with active bowel sounds.  No masses are palpated. EXTREMITIES:  The bilateral upper and lower extremities are warm and well-perfused.  There is currently no evidence of ischemia or microemboli.  There is no wounds or skin breakdown. SKIN:  Intact with no evidence of rashes. NEUROLOGIC:  Intact other than left-sided paresis.  LABORATORY DATA:  CBC on May 23, 2011, white count of 12.4, hemoglobin 14.6, hematocrit 40.5, platelets  118.  BMP on May 23, 2011, sodium 130, potassium 4.2, BUN 30, creatinine 0.8, GFR greater than 16.  Total bilirubin 0.4, alk phos 107, AST 20, ALT 21, total protein 6.5, blood albumin 3.4, calcium 8.9, phosphorous 26, magnesium 1.8, lipase 132.  Creatinine kinase total 57, CK-MB 2.3, troponin-I less than 0.30.  PT and INR on May 22, 2011, 13.7 and 1.03.  IMAGING:  CT abdomen and pelvis with contrast performed on May 22, 2011, revealed an abnormal left upper quadrant process that has an appearance suggesting postinflammatory processes that is correlated with the given history of pseudocyst.  There is extensive atherosclerosis with irregular mural thrombus throughout the abdominal aorta and no evidence of bowel obstruction or pelvic inflammatory process.  Secondary to the left upper quadrant process which is unclear, it was suggested an abdominal MRI, endoscopic ultrasound or followup CT be performed.  Chest x-ray on May 22, 2011 with slight lingular probable atelectasis with lower lung volumes.  CABG with normal heart size.  No acute findings.  ASSESSMENT: 1. Probable pancreatitis. 2. Incidental finding of mural thrombus in the abdominal aorta on CT     scan.  PLAN: 1. All the patient's medical issues including the pancreatitis, will     continue to be monitored and treated by the     Internal Medicine Team. 2. Dr. fields will review the CT scan and evaluate the patient     regarding the mural thrombus.  It is not likely to require     intervention at this time.     Pecola Leisure, PA   ______________________________ Janetta Hora Fields, MD    AY/MEDQ  D:  05/23/2011  T:  05/23/2011  Job:  [id number]  Electronically Signed by Pecola Leisure PA on 05/27/2011 09:55:03 AM Electronically Signed by Fabienne Bruns MD on 06/12/2011 06:29:32 PM

## 2011-07-31 NOTE — H&P (Signed)
NAME:  AZEL, GUMINA NO.:  [id number]  MEDICAL RECORD NO.:  [id number]  LOCATION:  [facility]                         FACILITY:  [facility]  PHYSICIAN:  Valetta Close, M.D.   DATE OF BIRTH:  October 04, 1946  DATE OF ADMISSION:  05/22/2011 DATE OF DISCHARGE:                             HISTORY & PHYSICAL   CHIEF COMPLAINT:  Abdominal pain and nausea.  HISTORY OF PRESENT ILLNESS:  This is a 65 year old male with a history of coronary artery disease, cerebrovascular disease, recurrent carotid artery stenosis, and also pancreatitis with history of a pseudocyst, who woke up early this morning about 5:30 with feeling of indigestion, felt like he would vomit although he did not.  The pain was in the middle and left side of his upper abdomen, it lasted about 3 hours and then improved, though still have some vague discomfort there, it feels like dull and pressure-like maybe 6/10, does not radiate anywhere, it is a kind of similar too many high pancreatitis in the past, not as bad as when he had the gallbladder disease in the past.  He has had no diaphoresis, no lightheadedness, no shortness of breath, no upper chest pain, and no left arm symptoms.  He is not taking anything for symptoms. He had not had any recent episodes recently and he has eaten nothing, but normal food, cooked by his wife.  He is also not drinking alcohol recently.  He has, however, changed his medications recently, had been on Plavix because there were working on his carotid arteries, recently he switched from Plavix to the Aggrenox, taking Aggrenox I think twice a day when aspirin once a day and these changes have made recently, but everything he says the same.  PAST MEDICAL HISTORY:  Hypertension, coronary artery disease status post CABG, CVA with persistent left-sided weakness and spasticity, seizure disorder, right ICA stenosis, and left ICA stenosis, status post bilateral carotid endarterectomy, he has a  history of pancreatitis with pseudocyst, and he has a cholecystectomy in the past.  FAMILY HISTORY:  Cerebral bleed, otherwise everyone in the family is healthy, retired, no alcohol, no tobacco, no drug use.  ALLERGIES:  No known drug allergies.  Medication list is pending, but in June 2012 he was on; 1. Zetia 10 mg once a day. 2. Vitamin D 500 mg once a day. 3. Metoprolol 25 mg once a day. 4. Magnesium oxide 250 mg once a day. 5. Keppra 500 mg twice a day. 6. Folbic 2.5/25/2 mg at night. 7. Flaxseed oil 1000 mg once a day. 8. Fish oil 1000 mg once a day. 9. Dilantin 100 mg extended release 3 times a day. 10.Dantrium 50 mg by mouth twice a day. 11.Crestor 10 mg by mouth once a day. 12.Coenzyme Q10 50 mg once a day. 13.Benicar 10 mg by mouth once a day. 14.Aspirin 81 mg once a day.  He was at that time on Plavix but he     since switched to Aggrenox 1 capsule by mouth twice a day.  REVIEW OF SYSTEM:  A 10-point review of system was performed and negative except for as per the HPI.  PHYSICAL EXAMINATION:  VITAL SIGNS:  Temperature 97.5,  blood pressure 187/73, respiratory rate 16, heart rate 62, and O2 sat 99% on room air. GENERAL:  He appears in his stated age and in no apparent distress. HEENT:  He has moist oral mucosa.  His eyes are anicteric, does have what I think is carotid artery bruits worse on the right. CARDIAC:  Regular rate and rhythm with no murmur. LUNGS:  Clear to auscultation bilaterally.  He is able to speak full sentences without difficulty. ABDOMEN:  He has mild left mid quadrant tenderness.  There is no distention.  No frank hepatosplenomegaly.  Bowel sounds are bit hyperactive.  He has no reproducible pain in his chest. GU:  Deferred. RECTAL:  Deferred. EXTREMITIES:  No edema. SKIN:  Intact. NEUROLOGICAL:  Notable for chronic left-sided weakness.  Otherwise, he is nonfocal and affect is pleasant.  LABORATORY DATA:  Last coagulation enzyme negative x1.   A UA is negative.  Lipase is elevated at 494, magnesium 2, white count 10.6, hemoglobin 14, hematocrit 40, platelets 131, sodium 135, potassium 4.6, chloride 100, bicarb 26, anion gap of 9, BUN 17, creatinine 0.9, glucose 177, calcium 9.3, total protein 6.9, albumin 3.6, AST 27, ALT 24, alk phos 101, bilirubin 0.2.  BNP 160.  PT and PTT are normal.  INR is 1.  ASSESSMENT AND PLAN: 1. Abdominal pain.  I think this is most likely pancreatitis.  Also     the differential is coronary artery disease and gastritis versus     peptic ulcer disease.  He has left-sided hemiparesis may make his     symptoms kind of atypical. 2. I am going to cycle enzymes and again EKG in the morning.  Would     the lipase be elevated, though most likely this is pancreatitis.  I     will get a CT scan mostly because of the history of the pseudocysts     just to reevaluate this, so do that with IV and oral contrast.  We     will hold his statin medications.  I will continue on him Aggrenox     and aspirin.  I am going to guaiac his stool and check his     hemoglobin levels and give him GI cocktail, mostly going to start     him on Protonix in case this is a gastritis and monitor given     p.r.n. morphine and p.r.n. nitro as needed for pain. 3. Hypertensive urgency.  This is secondary to his pain I believe, we     are going to put him on as needed clonidine, but otherwise continue     his home medications. 4. Cerebrovascular disease and coronary artery disease.  Continue with     Aggrenox and aspirin.  Cycle enzymes and EKG, I see no neurological     symptoms.  I do not see, however, re-imaging is indicated. 5. Hyperlipidemia.  I am going to recheck this in the morning, but I     will hold the Crestor and Zetia in the acute setting.  Benicar is     very uncommonly associated with pancreatitis and again I think he     is on these medicines for a while and if his pancreatitis issue     recurrence, I will keep that  medicine for now. 6. History of seizure disorder.  I see no recurrent seizure activity     but we will not make any change in his Dilantin or Keppra.  I  mostly going to continue with Dantrium. 7. Leukocytosis, mild.  Again, I suspect this is reactive.  This     admission was approximately 40 minutes.     Valetta Close, M.D.     JC/MEDQ  D:  05/22/2011  T:  05/22/2011  Job:  [id number]  cc:   Jorge Ny, MD Genene Churn. Love, M.D. Ranelle Oyster, M.D. Olene Craven, M.D. Pramod P. Pearlean Brownie, MD Darlin Priestly, MD  Electronically Signed by Valetta Close M.D. on 07/31/2011 02:34:39 PM

## 2011-09-16 ENCOUNTER — Encounter: Payer: Self-pay | Admitting: Surgery

## 2011-10-13 ENCOUNTER — Ambulatory Visit: Payer: Medicare Other | Admitting: Surgery

## 2011-10-13 ENCOUNTER — Other Ambulatory Visit: Payer: Medicare Other

## 2011-10-17 ENCOUNTER — Encounter: Payer: Self-pay | Admitting: Surgery

## 2011-10-20 ENCOUNTER — Ambulatory Visit (INDEPENDENT_AMBULATORY_CARE_PROVIDER_SITE_OTHER): Payer: Medicare Other | Admitting: *Deleted

## 2011-10-20 ENCOUNTER — Ambulatory Visit (INDEPENDENT_AMBULATORY_CARE_PROVIDER_SITE_OTHER): Payer: Medicare Other | Admitting: Surgery

## 2011-10-20 ENCOUNTER — Encounter: Payer: Self-pay | Admitting: Surgery

## 2011-10-20 VITALS — BP 133/83 | HR 55 | Resp 16 | Ht 67.0 in | Wt 175.0 lb

## 2011-10-20 DIAGNOSIS — I6529 Occlusion and stenosis of unspecified carotid artery: Secondary | ICD-10-CM | POA: Insufficient documentation

## 2011-10-20 DIAGNOSIS — Z48812 Encounter for surgical aftercare following surgery on the circulatory system: Secondary | ICD-10-CM

## 2011-10-20 DIAGNOSIS — I6523 Occlusion and stenosis of bilateral carotid arteries: Secondary | ICD-10-CM

## 2011-10-20 NOTE — Progress Notes (Signed)
Vascular and [facility]in Specialist of [facility]   Patient name: [name] MRN: [id number] DOB: [birth year] Sex: male     Chief Complaint  Patient presents with  . Carotid    6 month f/up with Labs.  Carotid Duplex     HISTORY OF PRESENT ILLNESS: The patient comes in today for followup of his carotid stents. The patient is status post bilateral carotid endarterectomy in [place] after an acute stroke in 1999. He is seen in office and found to have bilateral high-grade stenosis which was confirmed with angiography and April 2011. He then subsequently underwent bilateral staged carotid endarterectomies without incident. He is to today for followup. He has no new symptoms. At baseline he has left sided weakness. We switched him to Plavix for his stenting procedures he is now back to Aggrenox which has been his maintenance antiplatelet agent.  Past Medical History  Diagnosis Date  . Hyperlipidemia   . Hypertension   . Myocardial infarction 11/2002  . Stroke   . Seizure disorder   . Carotid stent occlusion bilateral 02/2011  . Carotid artery occlusion   . CAD (coronary artery disease)   . Peripheral arterial disease     Past Surgical History  Procedure Date  . Cholecystectomy     Gall Bladder  . Coronary artery bypass graft 11/2002    x3  . Carotid endarterectomy 12/1997  . Ankle surgery 1998    History   Social History  . Marital Status: Divorced    Spouse Name: N/A    Number of Children: N/A  . Years of Education: N/A   Occupational History  . Not on file.   Social History Main Topics  . Smoking status: Former Smoker    Types: Cigarettes    Quit date: 11/14/2007  . Smokeless tobacco: Not on file  . Alcohol Use: No  . Drug Use:   . Sexually Active:    Other Topics Concern  . Not on file   Social History Narrative  . No narrative on file    Family History  Problem Relation Age of Onset  . Stroke Father     Allergies as of 10/20/2011  . (No Known  Allergies)    Current Outpatient Prescriptions on File Prior to Visit  Medication Sig Dispense Refill  . Coenzyme Q10 (COQ10) 50 MG CAPS Take 50 mg by mouth daily.        . dantrolene (DANTRIUM) 50 MG capsule Take 100 mg by mouth daily.        Marland Kitchen dipyridamole-aspirin (AGGRENOX) 25-200 MG per 12 hr capsule Take 1 capsule by mouth 2 (two) times daily.        Marland Kitchen ezetimibe (ZETIA) 10 MG tablet Take 10 mg by mouth daily.        . folic acid-pyridoxine-cyancobalamin (FOLTX) 2.5-25-2 MG TABS Take 1 tablet by mouth daily.        Marland Kitchen levETIRAcetam (KEPPRA) 500 MG tablet Take 500 mg by mouth 2 (two) times daily.        . Magnesium 250 MG TABS Take 1 tablet by mouth daily.        . metoprolol tartrate (LOPRESSOR) 25 MG tablet Take 25 mg by mouth daily.        . Omega-3 Fatty Acids (FISH OIL) 1000 MG CAPS Take 1 capsule by mouth daily.        . phenytoin (DILANTIN) 100 MG ER capsule Take 100 mg by mouth 3 (three) times daily.        Marland Kitchen  rosuvastatin (CRESTOR) 20 MG tablet Take 30 mg by mouth daily.        . vitamin C (ASCORBIC ACID) 500 MG tablet Take 500 mg by mouth daily.        . Flaxseed, Linseed, 1000 MG CAPS Take 1 capsule by mouth daily.          REVIEW OF SYSTEMS: No changes  PHYSICAL EXAMINATION:   Vital signs are BP 133/83  Pulse 55  Resp 16  Ht 5\' 7"  (1.702 m)  Wt 175 lb (79.379 kg)  BMI 27.41 kg/m2  SpO2 98% General: The patient appears their stated age. HEENT:  No gross abnormalities Pulmonary:  Non labored breathing Abdomen: Soft and non-tender Musculoskeletal: There are no major deformities. Neurologic: Left-sided weakness, chronichiatric: The patient has normal affect. Cardiovascular: There is a regular rate and rhythm without significant murmur appreciated. no carotid bruits   Diagnostic Studies  carotid duplex reveals patent bilateral carotid stenting with out hemodynamically significant stenosis   Assessment:  status post bilateral carotid stents  Plan: The patient  will continue on our ultrasound surveillance protocol. His neck study will be in 6 months. I will plan on seeing him back in one year. He will continue with Aggrenox therapy.   Jorge Ny, M.D. [facility] of [place] Office: [phone] Pager:  [phone]

## 2011-10-23 ENCOUNTER — Other Ambulatory Visit: Payer: Self-pay | Admitting: Family Medicine

## 2011-10-23 DIAGNOSIS — K862 Cyst of pancreas: Secondary | ICD-10-CM

## 2011-10-27 ENCOUNTER — Ambulatory Visit
Admission: RE | Admit: 2011-10-27 | Discharge: 2011-10-27 | Disposition: A | Discharge status: Home / Self Care | Payer: Medicare Other | Source: Ambulatory Visit | Attending: Family Medicine | Admitting: Family Medicine

## 2011-10-27 DIAGNOSIS — K862 Cyst of pancreas: Secondary | ICD-10-CM

## 2011-10-27 MED ORDER — IOHEXOL 300 MG/ML  SOLN
125.0000 mL | Freq: Once | INTRAMUSCULAR | Status: AC | PRN
  Administered 2011-10-27: 125 mL via INTRAVENOUS

## 2011-11-03 NOTE — Procedures (Unsigned)
CAROTID DUPLEX EXAM  INDICATION:  Follow up bilateral carotid stents.  HISTORY: Diabetes:  No. Cardiac:  CABG. Hypertension:  Yes. Smoking:  Previously. Previous Surgery:  Bilateral carotid endarterectomies in 1999 in [place].  Left carotid stent on 02/12/11.  Right carotid stent, 02/26/11. CV History:  CVA. Amaurosis Fugax , Paresthesias , Hemiparesis                                      RIGHT             LEFT Brachial systolic pressure:         140               140 Brachial Doppler waveforms:         Triphasic         Triphasic Vertebral direction of flow:        Antegrade         Antegrade DUPLEX VELOCITIES (cm/sec) CCA peak systolic                   85                89 ECA peak systolic                   193               72 ICA peak systolic                   60 (mid)          82 (mid) ICA end diastolic                   18                13 PLAQUE MORPHOLOGY:                  Soft              Mixed PLAQUE AMOUNT:                      Mild              Minimal PLAQUE LOCATION:                    CCA               ICA  IMPRESSION:  Patent bilateral carotid stents and carotid endarterectomy sites with no hemodynamically significant stenosis noted.         ___________________________________________ V. Charlena Cross, MD  SS/MEDQ  D:  10/20/2011  T:  10/20/2011  Job:  [id number]

## 2012-01-11 ENCOUNTER — Other Ambulatory Visit: Payer: Self-pay | Admitting: Physical Medicine & Rehabilitation

## 2012-03-29 ENCOUNTER — Encounter: Payer: Medicare Other | Attending: Physical Medicine & Rehabilitation | Admitting: Physical Medicine & Rehabilitation

## 2012-03-29 ENCOUNTER — Encounter: Payer: Self-pay | Admitting: Physical Medicine & Rehabilitation

## 2012-03-29 ENCOUNTER — Ambulatory Visit: Payer: Medicare Other | Admitting: Physical Medicine & Rehabilitation

## 2012-03-29 VITALS — BP 138/73 | HR 60 | Resp 16 | Ht 67.0 in | Wt 174.0 lb

## 2012-03-29 DIAGNOSIS — G40909 Epilepsy, unspecified, not intractable, without status epilepticus: Secondary | ICD-10-CM | POA: Insufficient documentation

## 2012-03-29 DIAGNOSIS — I6330 Cerebral infarction due to thrombosis of unspecified cerebral artery: Secondary | ICD-10-CM

## 2012-03-29 DIAGNOSIS — Z951 Presence of aortocoronary bypass graft: Secondary | ICD-10-CM | POA: Insufficient documentation

## 2012-03-29 DIAGNOSIS — E785 Hyperlipidemia, unspecified: Secondary | ICD-10-CM | POA: Insufficient documentation

## 2012-03-29 DIAGNOSIS — I10 Essential (primary) hypertension: Secondary | ICD-10-CM | POA: Insufficient documentation

## 2012-03-29 DIAGNOSIS — I252 Old myocardial infarction: Secondary | ICD-10-CM | POA: Insufficient documentation

## 2012-03-29 DIAGNOSIS — I2510 Atherosclerotic heart disease of native coronary artery without angina pectoris: Secondary | ICD-10-CM | POA: Insufficient documentation

## 2012-03-29 DIAGNOSIS — R569 Unspecified convulsions: Secondary | ICD-10-CM

## 2012-03-29 DIAGNOSIS — I69959 Hemiplegia and hemiparesis following unspecified cerebrovascular disease affecting unspecified side: Secondary | ICD-10-CM | POA: Insufficient documentation

## 2012-03-29 DIAGNOSIS — R269 Unspecified abnormalities of gait and mobility: Secondary | ICD-10-CM | POA: Insufficient documentation

## 2012-03-29 DIAGNOSIS — G8110 Spastic hemiplegia affecting unspecified side: Secondary | ICD-10-CM

## 2012-03-29 DIAGNOSIS — R279 Unspecified lack of coordination: Secondary | ICD-10-CM | POA: Insufficient documentation

## 2012-03-29 DIAGNOSIS — I739 Peripheral vascular disease, unspecified: Secondary | ICD-10-CM | POA: Insufficient documentation

## 2012-03-29 NOTE — Patient Instructions (Signed)
Call me with any problems or questions 

## 2012-03-29 NOTE — Progress Notes (Signed)
Subjective:    Patient ID: [name], male    DOB: 08-18-1946, 66 y.o.   MRN: [id number]  HPI  Doris is back regarding his chronic spastic hemiparesis. He broke a metatarsal bone in his left foot earlier this year. He was in a walking boot for a short period of time. He also fell about a month ago, but fortunately didn't sustain any major injuries.   He was hospitalized with pancreatitis last year, and he's had problems with his BP and elevated HR at times with exercise activities. Cardiology has been following him for this. The patient is somewhat hesitant to pursue exercise given his recent symptomatology.  He saw Dr. Sandria Manly about 2 weeks ago for follow up and he apparently noted further problems with his gait. Levels of his seizure meds have been therapeutic. His wife states that since the last seizure which was over a year ago, his balance hasn't been the same. This is the time he started the Dilantin as well.  He remains on Dantrium for spasticity control. Right has lab work with her that showed normal liver function testing.   Pain Inventory Average Pain 0 Pain Right Now 0 My pain is aching  In the last 24 hours, has pain interfered with the following? General activity 0 Relation with others 0 Enjoyment of life 0 What TIME of day is your pain at its worst? N/A Sleep (in general) Good  Pain is worse with: N/A Pain improves with: N/A Relief from Meds: N/A  Mobility walk with assistance use a cane how many minutes can you walk? 10 ability to climb steps?  yes do you drive?  yes  Function disabled: date disabled 1999 I need assistance with the following:  meal prep, household duties and shopping  Neuro/Psych weakness  Prior Studies x-rays CT/MRI  Physicians involved in your care Any changes since last visit?  no   Family History  Problem Relation Age of Onset  . Stroke Father    History   Social History  . Marital Status: Divorced    Spouse Name: N/A      Number of Children: N/A  . Years of Education: N/A   Social History Main Topics  . Smoking status: Former Smoker    Types: Cigarettes    Quit date: 11/14/2007  . Smokeless tobacco: None  . Alcohol Use: No  . Drug Use:   . Sexually Active:    Other Topics Concern  . None   Social History Narrative  . None   Past Surgical History  Procedure Date  . Cholecystectomy     Gall Bladder  . Coronary artery bypass graft 11/2002    x3  . Carotid endarterectomy 12/1997  . Ankle surgery 1998   Past Medical History  Diagnosis Date  . Hyperlipidemia   . Hypertension   . Myocardial infarction 11/2002  . Stroke   . Seizure disorder   . Carotid stent occlusion bilateral 02/2011  . Carotid artery occlusion   . CAD (coronary artery disease)   . Peripheral arterial disease    BP 138/73  Pulse 60  Resp 16  Ht 5\' 7"  (1.702 m)  Wt 174 lb (78.926 kg)  BMI 27.25 kg/m2  SpO2 97%      Review of Systems  HENT: Negative.   Eyes: Negative.   Respiratory: Negative.   Cardiovascular: Negative.   Gastrointestinal: Negative.   Genitourinary: Negative.   Musculoskeletal: Negative.   Skin: Negative.   Neurological: Positive for weakness.  Hematological: Negative.   Psychiatric/Behavioral: Negative.        Objective:   Physical Exam  Constitutional: He is oriented to person, place, and time. He appears well-developed and well-nourished.  HENT:  Head: Normocephalic and atraumatic.  Right Ear: External ear normal.  Left Ear: External ear normal.  Nose: Nose normal.  Mouth/Throat: Oropharynx is clear and moist.  Eyes: Conjunctivae and EOM are normal. Pupils are equal, round, and reactive to light.  Neck: Normal range of motion. Neck supple.  Cardiovascular: Normal rate and regular rhythm.   Pulmonary/Chest: Effort normal.  Abdominal: Soft. Bowel sounds are normal.  Musculoskeletal: Normal range of motion.  Neurological: He is alert and oriented to person, place, and time. A  cranial nerve deficit is present.       LLE is 1-3/5. LUE is 1-3/5.  Spasticity is 1/4.  Sensation 1/2.   Psychiatric: He has a normal mood and affect. His behavior is normal. Judgment and thought content normal.   The patient ambulated for me today and tends to hyperextend the left knee with stance. Right knee is a bit is in flexion when he bears weight on that side. He tends to rotate the left leg slightly laterally. He needs extra time to take change directions.  he tended to lean backwards with his gait today. Cognitively he appears to be in her baseline.       Assessment & Plan:  ASSESSMENT:  1. Right cerebrovascular accident, spastic left hemiparesis.  2. Seizure disorder.  3. Patient with worsening balance and gait over the last year. This could be related to his seizure although I suspect it is multifactorial, related to his multiple medical issues, and anxiety, lack of exercise, medications.  PLAN:  1. Made a referral to neuro rehab to work on gait safety and a HEP. He needs to have a consistent maintenance exercise program which has fallen off quite a bit over the last several months. 2. Continue with Dilantin and keppra per neuro recs. I understand why he needs Dilantin given his seizure history, however, he and his wife need to understand that this may be affecting his balance, and they will have to make adjustments otherwise in regards to his safety.  3. Dantrium will be continued for spasticity management.  4. 3 months follow up. All questions were encouraged and answered.

## 2012-04-13 ENCOUNTER — Ambulatory Visit
Payer: Medicare Other | Attending: Physical Medicine & Rehabilitation | Admitting: Rehabilitative and Restorative Service Providers"

## 2012-04-13 DIAGNOSIS — R269 Unspecified abnormalities of gait and mobility: Secondary | ICD-10-CM | POA: Insufficient documentation

## 2012-04-13 DIAGNOSIS — R262 Difficulty in walking, not elsewhere classified: Secondary | ICD-10-CM | POA: Insufficient documentation

## 2012-04-13 DIAGNOSIS — IMO0001 Reserved for inherently not codable concepts without codable children: Secondary | ICD-10-CM | POA: Insufficient documentation

## 2012-04-16 ENCOUNTER — Encounter: Payer: Self-pay | Admitting: Neurosurgery

## 2012-04-19 ENCOUNTER — Ambulatory Visit (INDEPENDENT_AMBULATORY_CARE_PROVIDER_SITE_OTHER): Payer: Medicare Other | Admitting: Neurosurgery

## 2012-04-19 ENCOUNTER — Other Ambulatory Visit (INDEPENDENT_AMBULATORY_CARE_PROVIDER_SITE_OTHER): Payer: Medicare Other | Admitting: *Deleted

## 2012-04-19 ENCOUNTER — Encounter: Payer: Self-pay | Admitting: Neurosurgery

## 2012-04-19 VITALS — BP 121/81 | HR 59 | Resp 14 | Ht 67.0 in | Wt 174.4 lb

## 2012-04-19 DIAGNOSIS — I6529 Occlusion and stenosis of unspecified carotid artery: Secondary | ICD-10-CM

## 2012-04-19 DIAGNOSIS — Z48812 Encounter for surgical aftercare following surgery on the circulatory system: Secondary | ICD-10-CM

## 2012-04-19 NOTE — Progress Notes (Signed)
[facility] [place] Carotid Office Note  CC: Six-month carotid duplex Referring Physician: Myra Gianotti  History of Present Illness: 66 year old male patient of Dr. Myra Gianotti status post bilateral CEA in 1999 and staged carotid stents in May of 2012 bilaterally. Patient does have a complicated medical history and has been seen by neurology as well as pain management. The patient denies any signs or symptoms of CVA, TIA, amaurosis fugax or any neural deficit other than his residual from his previous CVA in 1999.  Past Medical History  Diagnosis Date  . Hyperlipidemia   . Hypertension   . Myocardial infarction 11/2002  . Stroke   . Seizure disorder   . Carotid stent occlusion bilateral 02/2011  . Carotid artery occlusion   . CAD (coronary artery disease)   . Peripheral arterial disease   . Cause of injury, fall 03/01/12    ROS: [x]  Positive   [ ]  Denies    General: [ ]  Weight loss, [ ]  Fever, [ ]  chills Neurologic: [ ]  Dizziness, [ ]  Blackouts, [ ]  Seizure [ ]  Stroke, [ ]  "Mini stroke", [ ]  Slurred speech, [ ]  Temporary blindness; [ ]  weakness in arms or legs, [ ]  Hoarseness Cardiac: [ ]  Chest pain/pressure, [ ]  Shortness of breath at rest [ ]  Shortness of breath with exertion, [ ]  Atrial fibrillation or irregular heartbeat Vascular: [ ]  Pain in legs with walking, [ ]  Pain in legs at rest, [ ]  Pain in legs at night,  [ ]  Non-healing ulcer, [ ]  Blood clot in vein/DVT,   Pulmonary: [ ]  Home oxygen, [ ]  Productive cough, [ ]  Coughing up blood, [ ]  Asthma,  [ ]  Wheezing Musculoskeletal:  [ ]  Arthritis, [ ]  Low back pain, [ ]  Joint pain Hematologic: [ ]  Easy Bruising, [ ]  Anemia; [ ]  Hepatitis Gastrointestinal: [ ]  Blood in stool, [ ]  Gastroesophageal Reflux/heartburn, [ ]  Trouble swallowing Urinary: [ ]  chronic Kidney disease, [ ]  on HD - [ ]  MWF or [ ]  TTHS, [ ]  Burning with urination, [ ]  Difficulty urinating Skin: [ ]  Rashes, [ ]  Wounds Psychological: [ ]  Anxiety, [ ]   Depression   Social History History  Substance Use Topics  . Smoking status: Former Smoker    Types: Cigarettes    Quit date: 11/14/2007  . Smokeless tobacco: Not on file  . Alcohol Use: No    Family History Family History  Problem Relation Age of Onset  . Stroke Father     No Known Allergies  Current Outpatient Prescriptions  Medication Sig Dispense Refill  . Coenzyme Q10 (COQ10) 50 MG CAPS Take 50 mg by mouth daily.        . dantrolene (DANTRIUM) 50 MG capsule TAKE ONE CAPSULE BY MOUTH TWICE DAILY  180 capsule  2  . dipyridamole-aspirin (AGGRENOX) 25-200 MG per 12 hr capsule Take 1 capsule by mouth 2 (two) times daily.        Marland Kitchen ezetimibe (ZETIA) 10 MG tablet Take 10 mg by mouth daily.        . Flaxseed, Linseed, 1000 MG CAPS Take 1 capsule by mouth daily.       . folic acid-pyridoxine-cyancobalamin (FOLTX) 2.5-25-2 MG TABS Take 1 tablet by mouth daily.        Marland Kitchen levETIRAcetam (KEPPRA) 500 MG tablet Take 500 mg by mouth 2 (two) times daily.        . Magnesium 250 MG TABS Take 1 tablet by mouth daily.        Marland Kitchen  metoprolol tartrate (LOPRESSOR) 25 MG tablet Take 25 mg by mouth daily.        Marland Kitchen olmesartan (BENICAR) 20 MG tablet Take 20 mg by mouth daily.        . Omega-3 Fatty Acids (FISH OIL) 1000 MG CAPS Take 1 capsule by mouth daily.        . phenytoin (DILANTIN) 100 MG ER capsule Take 100 mg by mouth 3 (three) times daily.        . rosuvastatin (CRESTOR) 20 MG tablet Take 30 mg by mouth daily.        . vitamin C (ASCORBIC ACID) 500 MG tablet Take 500 mg by mouth daily.        . Flaxseed MISC by Does not apply route.          Physical Examination  Filed Vitals:   04/19/12 1438  BP: 121/81  Pulse:   Resp:     Body mass index is 27.31 kg/(m^2).  General:  WDWN in NAD Gait: Normal HEENT: WNL Eyes: Pupils equal Pulmonary: normal non-labored breathing , without Rales, rhonchi,  wheezing Cardiac: RRR, without  Murmurs, rubs or gallops; Abdomen: soft, NT, no  masses Skin: no rashes, ulcers noted  Vascular Exam Pulses: 2+ radial pulses bilaterally Carotid bruits: Carotid pulses to auscultation no bruits are heard Extremities without ischemic changes, no Gangrene , no cellulitis; no open wounds;  Musculoskeletal: no muscle wasting or atrophy   Neurologic: A&O X 3; Appropriate Affect ; SENSATION: normal; MOTOR FUNCTION:  moving all extremities equally. Speech is fluent/normal  Non-Invasive Vascular Imaging CAROTID DUPLEX 04/19/2012  Right ICA 20 - 39 % stenosis Left ICA 20 - 39 % stenosis   ASSESSMENT/PLAN: Status post bilateral carotid stents in 2012, currently no acute difficulties. The patient will followup here in 6 months as scheduled already in January with Dr. Myra Gianotti and repeat carotid studies. His questions were encouraged and answered, he and his wife are in agreement with this plan.  Lauree Chandler ANP   Clinic MD: Myra Gianotti

## 2012-04-23 ENCOUNTER — Ambulatory Visit: Payer: Medicare Other | Admitting: Rehabilitative and Restorative Service Providers"

## 2012-04-26 NOTE — Procedures (Unsigned)
CAROTID DUPLEX EXAM  INDICATION:  Followup bilateral carotid stents  HISTORY: Diabetes:  No Cardiac:  Yes Hypertension:  Yes Smoking:  Previous Previous Surgery:  Bilateral CEA 1999; right carotid stent 02/26/2011; left carotid stent 02/12/2011 CV History:  Multiple recent falls for the last 6 months without dizziness or loss of consciousness Amaurosis Fugax No, Paresthesias No, Hemiparesis No                                      RIGHT             LEFT Brachial systolic pressure:         138               118 Brachial Doppler waveforms:         WNL               WNL Vertebral direction of flow:        Abnormal antegrade                  Antegrade DUPLEX VELOCITIES (cm/sec) CCA peak systolic                   80                86 (stent) ECA peak systolic                   106               88 ICA peak systolic                   74 (stent)        88 (stent) ICA end diastolic                   25 (stent)        30 (stent) PLAQUE MORPHOLOGY:                  Soft PLAQUE AMOUNT:                      Mild              NA PLAQUE LOCATION:                    CCA  IMPRESSION: 1. Widely patent stents of the bilateral carotid arteries. 2. Mild disease was observed in the right common carotid artery     proximal to the stent. 3. Right vertebral artery is abnormal antegrade, left vertebral artery     is antegrade. 4. Incidental finding:  Thyroid cystic structure measuring 1.7 cm x     2.1 cm on the left side.  ___________________________________________ V. Charlena Cross, MD  LT/MEDQ  D:  04/19/2012  T:  04/19/2012  Job:  [id number]

## 2012-04-27 ENCOUNTER — Ambulatory Visit: Payer: Medicare Other | Admitting: Rehabilitative and Restorative Service Providers"

## 2012-04-30 ENCOUNTER — Ambulatory Visit: Payer: Medicare Other | Admitting: Physical Therapy

## 2012-05-03 ENCOUNTER — Ambulatory Visit: Payer: Medicare Other | Admitting: Rehabilitative and Restorative Service Providers"

## 2012-05-05 ENCOUNTER — Ambulatory Visit: Payer: Medicare Other | Admitting: Rehabilitative and Restorative Service Providers"

## 2012-05-17 ENCOUNTER — Ambulatory Visit
Payer: Medicare Other | Attending: Physical Medicine & Rehabilitation | Admitting: Rehabilitative and Restorative Service Providers"

## 2012-05-17 DIAGNOSIS — IMO0001 Reserved for inherently not codable concepts without codable children: Secondary | ICD-10-CM | POA: Insufficient documentation

## 2012-05-17 DIAGNOSIS — R269 Unspecified abnormalities of gait and mobility: Secondary | ICD-10-CM | POA: Insufficient documentation

## 2012-05-17 DIAGNOSIS — R262 Difficulty in walking, not elsewhere classified: Secondary | ICD-10-CM | POA: Insufficient documentation

## 2012-05-19 ENCOUNTER — Ambulatory Visit: Payer: Medicare Other | Admitting: Rehabilitative and Restorative Service Providers"

## 2012-05-24 ENCOUNTER — Ambulatory Visit: Payer: Medicare Other | Admitting: Physical Therapy

## 2012-05-26 ENCOUNTER — Ambulatory Visit: Payer: Medicare Other | Admitting: *Deleted

## 2012-05-31 ENCOUNTER — Ambulatory Visit: Payer: Medicare Other | Admitting: *Deleted

## 2012-06-01 ENCOUNTER — Ambulatory Visit: Payer: Medicare Other | Admitting: Physical Medicine & Rehabilitation

## 2012-06-02 ENCOUNTER — Ambulatory Visit: Payer: Medicare Other | Admitting: *Deleted

## 2012-06-08 ENCOUNTER — Ambulatory Visit: Payer: Medicare Other | Admitting: Rehabilitative and Restorative Service Providers"

## 2012-06-10 ENCOUNTER — Ambulatory Visit: Payer: Medicare Other | Admitting: Rehabilitative and Restorative Service Providers"

## 2012-06-28 ENCOUNTER — Encounter: Payer: Medicare Other | Attending: Physical Medicine & Rehabilitation | Admitting: Physical Medicine & Rehabilitation

## 2012-06-28 ENCOUNTER — Encounter: Payer: Self-pay | Admitting: Physical Medicine & Rehabilitation

## 2012-06-28 VITALS — BP 156/71 | HR 64 | Resp 12 | Ht 67.0 in | Wt 174.4 lb

## 2012-06-28 DIAGNOSIS — G8110 Spastic hemiplegia affecting unspecified side: Secondary | ICD-10-CM

## 2012-06-28 DIAGNOSIS — I69959 Hemiplegia and hemiparesis following unspecified cerebrovascular disease affecting unspecified side: Secondary | ICD-10-CM | POA: Insufficient documentation

## 2012-06-28 DIAGNOSIS — R279 Unspecified lack of coordination: Secondary | ICD-10-CM | POA: Insufficient documentation

## 2012-06-28 DIAGNOSIS — R269 Unspecified abnormalities of gait and mobility: Secondary | ICD-10-CM | POA: Insufficient documentation

## 2012-06-28 DIAGNOSIS — I10 Essential (primary) hypertension: Secondary | ICD-10-CM | POA: Insufficient documentation

## 2012-06-28 DIAGNOSIS — Z951 Presence of aortocoronary bypass graft: Secondary | ICD-10-CM | POA: Insufficient documentation

## 2012-06-28 DIAGNOSIS — I2510 Atherosclerotic heart disease of native coronary artery without angina pectoris: Secondary | ICD-10-CM | POA: Insufficient documentation

## 2012-06-28 DIAGNOSIS — R569 Unspecified convulsions: Secondary | ICD-10-CM

## 2012-06-28 DIAGNOSIS — I6529 Occlusion and stenosis of unspecified carotid artery: Secondary | ICD-10-CM

## 2012-06-28 DIAGNOSIS — I252 Old myocardial infarction: Secondary | ICD-10-CM | POA: Insufficient documentation

## 2012-06-28 DIAGNOSIS — I739 Peripheral vascular disease, unspecified: Secondary | ICD-10-CM | POA: Insufficient documentation

## 2012-06-28 DIAGNOSIS — I6330 Cerebral infarction due to thrombosis of unspecified cerebral artery: Secondary | ICD-10-CM

## 2012-06-28 DIAGNOSIS — E785 Hyperlipidemia, unspecified: Secondary | ICD-10-CM | POA: Insufficient documentation

## 2012-06-28 DIAGNOSIS — G40909 Epilepsy, unspecified, not intractable, without status epilepticus: Secondary | ICD-10-CM | POA: Insufficient documentation

## 2012-06-28 NOTE — Patient Instructions (Signed)
Call me with any questions. Pursue aquatic activities as possbile

## 2012-06-28 NOTE — Progress Notes (Signed)
Subjective:    Patient ID: [name], male    DOB: May 17, 1946, 66 y.o.   MRN: [id number]  HPI  Mr. Tegtmeyer is back regarding his spastic left hemiparesis. We sent him for PT which has helped him immensely. He is up to walking 3 minutes without stopping. He hasn't fallen since i last saw him. He likes his new AFO which helps his knee control. He also has a new straight cane with tripod base which has been useful also.   He remains on the dantrium, keppra, and dilantin. No new problems have been noted. He hasn't had any seizures   Pain Inventory Average Pain 0 Pain Right Now 0 My pain is no pain  In the last 24 hours, has pain interfered with the following? General activity 0 Relation with others 0 Enjoyment of life 0 What TIME of day is your pain at its worst? no pain Sleep (in general) Good  Pain is worse with: no pain Pain improves with: no pain Relief from Meds: no pain  Mobility use a cane how many minutes can you walk? 10 ability to climb steps?  yes do you drive?  yes  Function retired  Neuro/Psych weakness trouble walking  Prior Studies Any changes since last visit?  no  Physicians involved in your care Any changes since last visit?  no   Family History  Problem Relation Age of Onset  . Stroke Father    History   Social History  . Marital Status: Divorced    Spouse Name: N/A    Number of Children: N/A  . Years of Education: N/A   Social History Main Topics  . Smoking status: Former Smoker    Types: Cigarettes    Quit date: 11/14/2007  . Smokeless tobacco: Never Used  . Alcohol Use: No  . Drug Use: No  . Sexually Active: None   Other Topics Concern  . None   Social History Narrative  . None   Past Surgical History  Procedure Date  . Cholecystectomy     Gall Bladder  . Coronary artery bypass graft 11/2002    x3  . Ankle surgery 1998  . Carotid endarterectomy 12/1997    Bilateral   . Carotid endarterectomy 02/12/12    Left  Carotid  . Carotid endarterectomy 02/26/12    Right Carotid   Past Medical History  Diagnosis Date  . Hyperlipidemia   . Hypertension   . Myocardial infarction 11/2002  . Stroke   . Seizure disorder   . Carotid stent occlusion bilateral 02/2011  . Carotid artery occlusion   . CAD (coronary artery disease)   . Peripheral arterial disease   . Cause of injury, fall 03/01/12   BP 156/71  Pulse 64  Resp 12  Ht 5\' 7"  (1.702 m)  Wt 174 lb 6.4 oz (79.107 kg)  BMI 27.31 kg/m2  SpO2 96%    Review of Systems  Musculoskeletal: Positive for gait problem.  Neurological: Positive for weakness.  All other systems reviewed and are negative.       Objective:   Physical Exam Constitutional: He is oriented to person, place, and time. He appears well-developed and well-nourished.  HENT:  Head: Normocephalic and atraumatic.  Right Ear: External ear normal.  Left Ear: External ear normal.  Nose: Nose normal.  Mouth/Throat: Oropharynx is clear and moist.  Eyes: Conjunctivae and EOM are normal. Pupils are equal, round, and reactive to light.  Neck: Normal range of motion. Neck supple.  Cardiovascular: Normal rate and regular rhythm.  Pulmonary/Chest: Effort normal.  Abdominal: Soft. Bowel sounds are normal.  Musculoskeletal: Normal range of motion.  Neurological: He is alert and oriented to person, place, and time. A cranial nerve deficit is present.  LLE is 1-3/5. LUE is 1-3/5. Spasticity is 1/4. Sensation 1/2.  Psychiatric: He has a normal mood and affect. His behavior is normal. Judgment and thought content normal.   The patient walks with a wide based, left-straight-legged gait. He has improved knee control but there is still hyperextension with weight bearing on the left.  He is able to change directions with extra time. Not once did he appear to be off balance.  Cognitively he appears to be in her baseline.   Assessment & Plan:   ASSESSMENT:  1. Right cerebrovascular accident,  spastic left hemiparesis.  2. Seizure disorder.  3. Patient with worsening balance and gait over the last year. This appears to have been multifactorial and improved with brace modifications and therapy.    PLAN:  1. Continue with home exercise program. He would do well in the aquatic setting. Asked him to focus on the quality and not the speed of his gait. Discussed slowly ramping up his exercise duration.  2. Continue with Dilantin and keppra per neuro recs.  3. Dantrium will be continued for spasticity management.  4. 12 months follow up. All questions were encouraged and answered

## 2012-07-07 ENCOUNTER — Other Ambulatory Visit: Payer: Self-pay | Admitting: Physical Medicine & Rehabilitation

## 2012-08-09 ENCOUNTER — Telehealth: Payer: Self-pay

## 2012-08-09 NOTE — Telephone Encounter (Signed)
Patient needs [place] to call [phone] for a formulary exception for his medication dantroline?.  Please call.

## 2012-08-09 NOTE — Telephone Encounter (Signed)
Formulary exception (prior auth) initiated.  They will not review this until the middle of December since it is for next year.  Patient wife aware.

## 2012-09-06 ENCOUNTER — Ambulatory Visit (HOSPITAL_COMMUNITY)
Admission: RE | Admit: 2012-09-06 | Discharge: 2012-09-06 | Disposition: A | Discharge status: Home / Self Care | Payer: Medicare Other | Source: Ambulatory Visit | Attending: Neurology | Admitting: Neurology

## 2012-09-06 DIAGNOSIS — E785 Hyperlipidemia, unspecified: Secondary | ICD-10-CM | POA: Insufficient documentation

## 2012-09-06 DIAGNOSIS — I252 Old myocardial infarction: Secondary | ICD-10-CM | POA: Insufficient documentation

## 2012-09-06 DIAGNOSIS — I2510 Atherosclerotic heart disease of native coronary artery without angina pectoris: Secondary | ICD-10-CM | POA: Insufficient documentation

## 2012-09-06 DIAGNOSIS — R1310 Dysphagia, unspecified: Secondary | ICD-10-CM

## 2012-09-06 DIAGNOSIS — I10 Essential (primary) hypertension: Secondary | ICD-10-CM | POA: Insufficient documentation

## 2012-09-06 DIAGNOSIS — I69991 Dysphagia following unspecified cerebrovascular disease: Secondary | ICD-10-CM | POA: Insufficient documentation

## 2012-09-06 DIAGNOSIS — Z8673 Personal history of transient ischemic attack (TIA), and cerebral infarction without residual deficits: Secondary | ICD-10-CM | POA: Insufficient documentation

## 2012-09-06 DIAGNOSIS — R1319 Other dysphagia: Secondary | ICD-10-CM | POA: Insufficient documentation

## 2012-09-06 NOTE — Procedures (Signed)
Objective Swallowing Evaluation: Modified Barium Swallowing Study  Patient Details  Name: [name] MRN: [id number] Date of Birth: 1946/01/22  Today's Date: 09/06/2012 Time: 1110-1140 SLP Time Calculation (min): 30 min  Past Medical History:  Past Medical History  Diagnosis Date  . Hyperlipidemia   . Hypertension   . Myocardial infarction 11/2002  . Stroke   . Seizure disorder   . Carotid stent occlusion bilateral 02/2011  . Carotid artery occlusion   . CAD (coronary artery disease)   . Peripheral arterial disease   . Cause of injury, fall 03/01/12   Past Surgical History:  Past Surgical History  Procedure Date  . Cholecystectomy     Gall Bladder  . Coronary artery bypass graft 11/2002    x3  . Ankle surgery 1998  . Carotid endarterectomy 12/1997    Bilateral   . Carotid endarterectomy 02/12/12    Left Carotid  . Carotid endarterectomy 02/26/12    Right Carotid   HPI:  Pt is a 66 year old male arriving for an outpatient MBS. The pt had a CVA in 59 and severe seizures in 2009. He has residual left sided weakness and cognitive deficits. His wife provides most of his history. She reports that the has complaints of food getting stuck after meals, coughing up some PO after meals, and choking on his saliva while sleeping when upright. He has had no pna and denies GER     Assessment / Plan / Recommendation Clinical Impression  Dysphagia Diagnosis: Suspected primary esophageal dysphagia Clinical impression: Pt presents with an overall functional oral and oropharygneal swallow. There was trace to mild vallecular residuals with solid pos that clears with volitional multiple swallows. There was no penetration or aspiration. An esophageal sweep revealed appearance of diffuse stasis with retrograde movement, particularly with solids (no radiologist present to confirm). Suspect this is the cause of pts globus sensation. Would suggest pt continue current diet, following solids with liquids  and f/u with MD regarding further evaluation/management of esophageal fucntion. No SLP f/u needed.     Treatment Recommendation  No treatment recommended at this time    Diet Recommendation Regular;Thin liquid   Liquid Administration via: Cup;Straw Medication Administration: Whole meds with liquid Supervision: Patient able to self feed Compensations: Multiple dry swallows after each bite/sip;Follow solids with liquid Postural Changes and/or Swallow Maneuvers: Seated upright 90 degrees;Upright 30-60 min after meal    Other  Recommendations Recommended Consults: Consider esophageal assessment;Consider GI evaluation Oral Care Recommendations: Oral care before and after PO   Follow Up Recommendations  None    Frequency and Duration        Pertinent Vitals/Pain none    SLP Swallow Goals     General HPI: Pt is a 66 year old male arriving for an outpatient MBS. The pt had a CVA in 66 and severe seizures in 2009. He has residual left sided weakness and cognitive deficits. His wife provides most of his history. She reports that the has complaints of food getting stuck after meals, coughing up some PO after meals, and choking on his saliva while sleeping when upright. He has had no pna and denies GER Type of Study: Modified Barium Swallowing Study Reason for Referral: Objectively evaluate swallowing function Diet Prior to this Study: Regular;Thin liquids Temperature Spikes Noted: N/A Respiratory Status: Room air History of Recent Intubation: No Behavior/Cognition: Alert;Cooperative;Pleasant mood Oral Cavity - Dentition: Adequate natural dentition Oral Motor / Sensory Function: Impaired motor Oral impairment: Left facial;Left lingual;Left labial  Self-Feeding Abilities: Able to feed self;Needs assist Patient Positioning: Upright in chair Baseline Vocal Quality: Clear Volitional Cough: Strong Volitional Swallow: Able to elicit Anatomy: Within functional limits Pharyngeal Secretions:  Not observed secondary MBS    Reason for Referral Objectively evaluate swallowing function   Oral Phase Oral Preparation/Oral Phase Oral Phase: WFL   Pharyngeal Phase Pharyngeal Phase Pharyngeal Phase: Impaired Pharyngeal - Thin Pharyngeal - Thin Cup: Within functional limits Pharyngeal - Thin Straw: Within functional limits Pharyngeal - Solids Pharyngeal - Puree: Reduced tongue base retraction;Pharyngeal residue - valleculae Pharyngeal - Regular: Reduced tongue base retraction;Pharyngeal residue - valleculae Pharyngeal - Pill: Within functional limits  Cervical Esophageal Phase    GO    Cervical Esophageal Phase Cervical Esophageal Phase: Impaired Cervical Esophageal Phase - Comment Cervical Esophageal Comment: Appearance of stasis throughout esophagus with retrograde movement, particularly with solids. Liquid bolus aids in clearance.     Functional Assessment Tool Used: clinical judgment Functional Limitations: Swallowing Swallow Current Status (J4782): At least 1 percent but less than 20 percent impaired, limited or restricted Swallow Goal Status [id number]): At least 1 percent but less than 20 percent impaired, limited or restricted Swallow Discharge Status [phone]): At least 1 percent but less than 20 percent impaired, limited or restricted   [facility] [place], [place] CCC-SLP [phone]  Claudine Mouton 09/06/2012, 1:22 PM

## 2012-10-04 ENCOUNTER — Other Ambulatory Visit: Payer: Self-pay | Admitting: *Deleted

## 2012-10-04 MED ORDER — DANTROLENE SODIUM 50 MG PO CAPS: 50.0000 mg | ORAL_CAPSULE | Freq: Two times a day (BID) | ORAL | Status: DC

## 2012-10-18 ENCOUNTER — Other Ambulatory Visit: Payer: Medicare Other

## 2012-10-18 ENCOUNTER — Ambulatory Visit: Payer: Medicare Other | Admitting: Surgery

## 2012-10-29 ENCOUNTER — Other Ambulatory Visit: Payer: Self-pay | Admitting: *Deleted

## 2012-10-29 DIAGNOSIS — Z48812 Encounter for surgical aftercare following surgery on the circulatory system: Secondary | ICD-10-CM

## 2012-10-29 DIAGNOSIS — I6529 Occlusion and stenosis of unspecified carotid artery: Secondary | ICD-10-CM

## 2012-11-05 ENCOUNTER — Encounter: Payer: Self-pay | Admitting: Surgery

## 2012-11-08 ENCOUNTER — Ambulatory Visit (INDEPENDENT_AMBULATORY_CARE_PROVIDER_SITE_OTHER): Payer: Medicare Other | Admitting: Vascular Surgery

## 2012-11-08 ENCOUNTER — Encounter: Payer: Self-pay | Admitting: Surgery

## 2012-11-08 ENCOUNTER — Ambulatory Visit (INDEPENDENT_AMBULATORY_CARE_PROVIDER_SITE_OTHER): Payer: Medicare Other | Admitting: Surgery

## 2012-11-08 VITALS — BP 159/74 | HR 57 | Resp 16 | Ht 67.0 in | Wt 168.0 lb

## 2012-11-08 DIAGNOSIS — I6529 Occlusion and stenosis of unspecified carotid artery: Secondary | ICD-10-CM

## 2012-11-08 DIAGNOSIS — Z48812 Encounter for surgical aftercare following surgery on the circulatory system: Secondary | ICD-10-CM

## 2012-11-08 NOTE — Progress Notes (Signed)
carotid duplex performed @ [facility] 11/08/2012

## 2012-11-08 NOTE — Addendum Note (Signed)
Addended by: Sharee Pimple on: 11/08/2012 02:01 PM   Modules accepted: Orders

## 2012-11-08 NOTE — Progress Notes (Signed)
[facility]   Patient name: [name] MRN: [id number] DOB: [birth year] Sex: male     Chief Complaint  Patient presents with  . Carotid    6 month  f/up with vascular Lab study.    HISTORY OF PRESENT ILLNESS: The patient comes in today for followup of his carotid stents. The patient is status post bilateral carotid endarterectomy in [place] after an acute stroke in 1999. He was seen in office and found to have bilateral high-grade stenosis which was confirmed with angiography in April 2011. He then subsequently underwent bilateral staged carotid endarterectomies without incident. He is to today for followup. He has no new symptoms. At baseline he has left sided weakness. We switched him to Plavix for his stenting procedures he is now back to Aggrenox which has been his maintenance antiplatelet agent.   Past Medical History  Diagnosis Date  . Hyperlipidemia   . Hypertension   . Myocardial infarction 11/2002  . Stroke   . Seizure disorder   . Carotid stent occlusion bilateral 02/2011  . Carotid artery occlusion   . CAD (coronary artery disease)   . Peripheral arterial disease   . Cause of injury, fall 03/01/12    Past Surgical History  Procedure Date  . Cholecystectomy     Gall Bladder  . Coronary artery bypass graft 11/2002    x3  . Ankle surgery 1998  . Carotid endarterectomy 12/1997    Bilateral   . Carotid endarterectomy 02/12/12    Left Carotid  . Carotid endarterectomy 02/26/12    Right Carotid    History   Social History  . Marital Status: Divorced    Spouse Name: N/A    Number of Children: N/A  . Years of Education: N/A   Occupational History  . Not on file.   Social History Main Topics  . Smoking status: Former Smoker    Types: Cigarettes    Quit date: 11/14/2007  . Smokeless tobacco: Never Used  . Alcohol Use: No  . Drug Use: No  . Sexually Active: Not on file   Other Topics Concern  . Not on file   Social  History Narrative  . No narrative on file    Family History  Problem Relation Age of Onset  . Stroke Father     Allergies as of 11/08/2012  . (No Known Allergies)    Current Outpatient Prescriptions on File Prior to Visit  Medication Sig Dispense Refill  . Coenzyme Q10 (COQ10) 50 MG CAPS Take 50 mg by mouth daily.        . dantrolene (DANTRIUM) 50 MG capsule Take 1 capsule (50 mg total) by mouth 2 (two) times daily.  180 capsule  2  . dipyridamole-aspirin (AGGRENOX) 25-200 MG per 12 hr capsule Take 1 capsule by mouth 2 (two) times daily.        Marland Kitchen ezetimibe (ZETIA) 10 MG tablet Take 10 mg by mouth daily.        . Flaxseed MISC by Does not apply route.        . Flaxseed, Linseed, 1000 MG CAPS Take 1 capsule by mouth daily.       . folic acid-pyridoxine-cyancobalamin (FOLTX) 2.5-25-2 MG TABS Take 1 tablet by mouth daily.        Marland Kitchen levETIRAcetam (KEPPRA) 500 MG tablet Take 500 mg by mouth 2 (two) times daily.        . Magnesium 250 MG TABS Take 1 tablet  by mouth daily.        . metoprolol tartrate (LOPRESSOR) 25 MG tablet Take 25 mg by mouth daily.        Marland Kitchen olmesartan (BENICAR) 20 MG tablet Take 20 mg by mouth daily.        . phenytoin (DILANTIN) 100 MG ER capsule Take 100 mg by mouth 3 (three) times daily.        . rosuvastatin (CRESTOR) 20 MG tablet Take 30 mg by mouth daily.        . vitamin C (ASCORBIC ACID) 500 MG tablet Take 500 mg by mouth daily.        . Omega-3 Fatty Acids (FISH OIL) 1000 MG CAPS Take 1 capsule by mouth daily.           REVIEW OF SYSTEMS: No changes from prior visit  PHYSICAL EXAMINATION:   Vital signs are BP 159/74  Pulse 57  Resp 16  Ht 5\' 7"  (1.702 m)  Wt 168 lb (76.204 kg)  BMI 26.31 kg/m2  SpO2 100% General: The patient appears their stated age. HEENT:  No gross abnormalities Pulmonary:  Non labored breathing Musculoskeletal: There are no major deformities. Neurologic: No focal weakness or paresthesias are detected, Skin: There are no ulcer  or rashes noted. Psychiatric: The patient has normal affect. Cardiovascular: There is a regular rate and rhythm without significant murmur appreciated. No carotid bruit   Diagnostic Studies Carotid duplex was ordered and reviewed today. This reveals patent bilateral carotid endarterectomy sites with stenting. There is mild hyperplasia at the distal left stent., Carotid disease is less than 50%. These are not significantly changed from prior visit.  Assessment: Status post bilateral carotid stenting Plan: The patient continues to do very well. He is now 5 years without smoking. There is minimal change in his carotid ultrasound. Both stents remained widely patent. I have scheduled him to come back for a repeat ultrasound in one year.  Jorge Ny, M.D. [facility] [place] Office: [phone] Pager:  [phone]

## 2012-11-18 ENCOUNTER — Other Ambulatory Visit (HOSPITAL_COMMUNITY): Payer: Self-pay | Admitting: Cardiovascular Disease

## 2012-11-18 DIAGNOSIS — I7389 Other specified peripheral vascular diseases: Secondary | ICD-10-CM

## 2012-11-18 DIAGNOSIS — I719 Aortic aneurysm of unspecified site, without rupture: Secondary | ICD-10-CM

## 2012-11-23 ENCOUNTER — Ambulatory Visit (HOSPITAL_COMMUNITY)
Admission: RE | Admit: 2012-11-23 | Discharge: 2012-11-23 | Disposition: A | Discharge status: Home / Self Care | Payer: Medicare Other | Source: Ambulatory Visit | Attending: Cardiovascular Disease | Admitting: Cardiovascular Disease

## 2012-11-23 DIAGNOSIS — I7389 Other specified peripheral vascular diseases: Secondary | ICD-10-CM

## 2012-11-23 DIAGNOSIS — I719 Aortic aneurysm of unspecified site, without rupture: Secondary | ICD-10-CM

## 2012-11-23 NOTE — Progress Notes (Signed)
Aorta Duplex Completed. Ko Bardon D  

## 2013-01-31 ENCOUNTER — Other Ambulatory Visit: Payer: Self-pay

## 2013-01-31 MED ORDER — PHENYTOIN SODIUM EXTENDED 100 MG PO CAPS: 100.0000 mg | ORAL_CAPSULE | Freq: Three times a day (TID) | ORAL | Status: DC

## 2013-01-31 NOTE — Telephone Encounter (Signed)
Dr Sandria Manly has retired.  Sent refill auth through [facility]

## 2013-02-14 ENCOUNTER — Other Ambulatory Visit: Payer: Self-pay

## 2013-02-14 MED ORDER — PHENYTOIN SODIUM EXTENDED 100 MG PO CAPS: 100.0000 mg | ORAL_CAPSULE | Freq: Three times a day (TID) | ORAL | Status: DC

## 2013-02-14 MED ORDER — ASPIRIN-DIPYRIDAMOLE ER 25-200 MG PO CP12: 1.0000 | ORAL_CAPSULE | Freq: Two times a day (BID) | ORAL | Status: DC

## 2013-02-14 MED ORDER — LEVETIRACETAM 500 MG PO TABS: 500.0000 mg | ORAL_TABLET | Freq: Two times a day (BID) | ORAL | Status: DC

## 2013-02-14 NOTE — Telephone Encounter (Signed)
Former [facility] patient.  Has not been assigned new provider.  Auth refills via [facility]  

## 2013-03-08 ENCOUNTER — Encounter: Payer: Self-pay | Admitting: Diagnostic Neuroimaging

## 2013-03-08 ENCOUNTER — Ambulatory Visit (INDEPENDENT_AMBULATORY_CARE_PROVIDER_SITE_OTHER): Payer: Medicare Other | Admitting: Diagnostic Neuroimaging

## 2013-03-08 VITALS — BP 160/81 | HR 62 | Temp 97.8°F | Ht 65.0 in | Wt 175.0 lb

## 2013-03-08 DIAGNOSIS — I6930 Unspecified sequelae of cerebral infarction: Secondary | ICD-10-CM

## 2013-03-08 DIAGNOSIS — Z8673 Personal history of transient ischemic attack (TIA), and cerebral infarction without residual deficits: Secondary | ICD-10-CM

## 2013-03-08 DIAGNOSIS — G40909 Epilepsy, unspecified, not intractable, without status epilepticus: Secondary | ICD-10-CM

## 2013-03-08 NOTE — Patient Instructions (Signed)
Continue current meds 

## 2013-03-08 NOTE — Progress Notes (Signed)
[facility]  PATIENT: [name] DOB: 07/15/1946  REFERRING CLINICIAN:  HISTORY FROM: patient and wife REASON FOR VISIT: follow up   HISTORICAL  CHIEF COMPLAINT:  Chief Complaint  Patient presents with  . Follow-up    [facility] trans #6    HISTORY OF PRESENT ILLNESS:   UPDATE 03/08/13: since last visit patient doing well. No seizures since 2005. Patient is stable on Dilantin and Keppra brand name medications. Patient's swallowing has improved since last visit. He make sure to take liquids following each mouthful of food.  PRIOR HPI (08/27/12, Dr. Sandria Manly): 68 year old right-handed white divorced male from [place], [place] who is cared for by his ex-first wife. He has a known history of hypertension, right MCA stroke 3/99 associated with residual left hemiparesis, acute right MCA stroke 09/2003,coronary artery disease status post 3 vessel bypass in 2004, bilateral carotid endarterectomies in 1999 and bilateral stent placement 5/12, recurrent TIAs versus seizures, documented seizure 11/12/2006, and Dilantin toxicity. His last MRI of the brain without contrast 11/13/2006 showed a large chronic right middle artery infarct involving the posterior temporal and parietal lobe and right thalamus. MR angiogram of the head without contrast showed intracranial atherosclerotic disease and occlusion of the right middle cerebral artery branch corresponding to the right MCA infarct. There was mild atherosclerotic vascular disease elsewhere and no aneurysm. Doppler study of the carotids 3/5/009 was negative for hemodynamically significant stenosis in the intracranial arteries.  EEG 11/13/2006 showed rhythmic discharges correlating with the EKG and were thought to be artifact. . His last seizure was 11/18/2006. He subsequently had one episode of confusion 11/26/2009 which might have been a seizure. He was at his son's house, came home, looked in the  driveway, and asked where his car was.  His car had been given to someone else in the family. He kept asking about the car and recognized that he was confused. The episode lasted 40 minutes. The patient lives with his caretaker. He is able to bathe himself and  take care of his toileting needs, but requires assistance in dressing. He  exercises at the [facility] 2 days per week. He has falls with a fall assessment tool score of 10. He denies macropsia, micropsia, strange odors or tastes. His caretaker reports that at times he has left knee hyperextension. He uses a left leg brace.  Marland Kitchen He denies TIA symptoms or warnings of seizures.He has finished 2 months of PT in no rehabilitation and has a new left foot brace, which is not associated with hyperextension of his knee. He denies spasticity or spasms. He enjoys playing "We" 2 times per day for exercise.His wife reports that he chokes on his saliva. He has not had  unexplained fever or chills. Last trough levetiracetam level 15.9  and phenytoin level 13.4 on 03/11/2012.  REVIEW OF SYSTEMS: Full 14 system review of systems performed and notable only for easy bruising impotence weakness.  ALLERGIES: No Known Allergies  HOME MEDICATIONS: Outpatient Prescriptions Prior to Visit  Medication Sig Dispense Refill  . Coenzyme Q10 (COQ10) 50 MG CAPS Take 50 mg by mouth daily.        . dantrolene (DANTRIUM) 50 MG capsule Take 1 capsule (50 mg total) by mouth 2 (two) times daily.  180 capsule  2  . dipyridamole-aspirin (AGGRENOX) 200-25 MG per 12 hr capsule Take 1 capsule by mouth 2 (two) times daily.  60 capsule  6  . ezetimibe (ZETIA) 10 MG tablet Take 10 mg by mouth daily.        Marland Kitchen  Flaxseed MISC by Does not apply route.        . Flaxseed, Linseed, 1000 MG CAPS Take 1 capsule by mouth daily.       . folic acid-pyridoxine-cyancobalamin (FOLTX) 2.5-25-2 MG TABS Take 1 tablet by mouth daily.        Marland Kitchen levETIRAcetam (KEPPRA) 500 MG tablet Take 1 tablet (500 mg total) by mouth 2 (two) times daily.  60 tablet  6    . Magnesium 250 MG TABS Take 1 tablet by mouth daily.        . metoprolol tartrate (LOPRESSOR) 25 MG tablet Take 25 mg by mouth daily.        Marland Kitchen olmesartan (BENICAR) 20 MG tablet Take 20 mg by mouth daily.        . phenytoin (DILANTIN) 100 MG ER capsule Take 1 capsule (100 mg total) by mouth 3 (three) times daily.  90 capsule  6  . rosuvastatin (CRESTOR) 20 MG tablet Take 30 mg by mouth daily.        . vitamin C (ASCORBIC ACID) 500 MG tablet Take 500 mg by mouth daily.        . Omega-3 Fatty Acids (FISH OIL) 1000 MG CAPS Take 1 capsule by mouth daily.         No facility-administered medications prior to visit.    PAST MEDICAL HISTORY: Past Medical History  Diagnosis Date  . Hyperlipidemia   . Hypertension   . Myocardial infarction 11/2002  . Stroke   . Seizure disorder   . Carotid stent occlusion bilateral 02/2011  . Carotid artery occlusion   . CAD (coronary artery disease)   . Peripheral arterial disease   . Cause of injury, fall 03/01/12  . Seizures     PAST SURGICAL HISTORY: Past Surgical History  Procedure Laterality Date  . Cholecystectomy      Gall Bladder  . Coronary artery bypass graft  11/2002    x3  . Ankle surgery  1998  . Carotid endarterectomy  12/1997    Bilateral   . Carotid endarterectomy  02/12/12    Left Carotid  . Carotid endarterectomy  02/26/12    Right Carotid  . Carotid stents Bilateral   . Carotid endarterectomies Bilateral 12/1997    FAMILY HISTORY: Family History  Problem Relation Age of Onset  . Stroke Father     SOCIAL HISTORY:  History   Social History  . Marital Status: Divorced    Spouse Name: N/A    Number of Children: N/A  . Years of Education: N/A   Occupational History  . Not on file.   Social History Main Topics  . Smoking status: Former Smoker    Types: Cigarettes    Quit date: 11/14/2007  . Smokeless tobacco: Never Used  . Alcohol Use: No  . Drug Use: No  . Sexually Active: Not on file   Other Topics Concern  .  Not on file   Social History Narrative  . No narrative on file     PHYSICAL EXAM  Filed Vitals:   03/08/13 0940  BP: 160/81  Pulse: 62  Temp: 97.8 F (36.6 C)  TempSrc: Oral  Height: 5\' 5"  (1.651 m)  Weight: 175 lb (79.379 kg)    Not recorded    Body mass index is 29.12 kg/(m^2).  GENERAL EXAM: Patient is in no distress  CARDIOVASCULAR: Regular rate and rhythm, no murmurs, no carotid bruits  NEUROLOGIC: MENTAL STATUS: awake, alert, SLIGHTLY DECR FLUENCY. Comprehension intact,  naming intact CRANIAL NERVE: no papilledema on fundoscopic exam, pupils equal and reactive to light, visual fields full to confrontation, extraocular muscles intact, no nystagmus, facial sensation; DECR LEFT LOWER FACIAL STRENGTH. symmetric, uvula midline, shoulder shrug symmetric, tongue midline. MOTOR: INCR TONE IN LUE AND LLE. LUE 1-2/5; LLE 3/5 WITH FOOT DROP.  SENSORY: DECR ON LEFT SIDE COORDINATION: finger-nose-finger, fine finger movements normal REFLEXES: BRISK IN LUE AND LLE. GAIT/STATION: LEFT HEMIPARIETIC GAIT   DIAGNOSTIC DATA (LABS, IMAGING, TESTING) - I reviewed patient records, labs, notes, testing and imaging myself where available.  Lab Results  Component Value Date   WBC 12.4* 05/23/2011   HGB 14.6 05/23/2011   HCT 40.5 05/23/2011   MCV 91.8 05/23/2011   PLT 118* 05/23/2011      Component Value Date/Time   NA 130* 05/23/2011 0231   K 4.2 05/23/2011 0231   CL 98 05/23/2011 0231   CO2 23 05/23/2011 0231   GLUCOSE 147* 05/23/2011 0231   BUN 13 05/23/2011 0231   CREATININE 0.80 05/23/2011 0231   CALCIUM 8.9 05/23/2011 0231   PROT 6.5 05/23/2011 0231   ALBUMIN 3.4* 05/23/2011 0231   AST 20 05/23/2011 0231   ALT 21 05/23/2011 0231   ALKPHOS 107 05/23/2011 0231   BILITOT 0.4 05/23/2011 0231   GFRNONAA >60 05/23/2011 0231   GFRAA >60 05/23/2011 0231   Lab Results  Component Value Date   CHOL 145 05/23/2011   HDL 41 05/23/2011   LDLCALC 82 05/23/2011   TRIG 112 05/23/2011   CHOLHDL 3.5  05/23/2011   Lab Results  Component Value Date   HGBA1C 6.5* 05/23/2011   Lab Results  Component Value Date   VITAMINB12 1948* 05/23/2011   Lab Results  Component Value Date   TSH 1.819 05/23/2011     ASSESSMENT AND PLAN  67 y.o. year old male  has a past medical history of Hyperlipidemia; Hypertension; Myocardial infarction (11/2002); Stroke; Seizure disorder; Carotid stent occlusion (bilateral 02/2011); Carotid artery occlusion; CAD (coronary artery disease); Peripheral arterial disease; Cause of injury, fall (03/01/12); and Seizures. here with history of right MCA stroke and seizure disorder. Patient is doing well on brand name Keppra and Dilantin. We'll continue these medications.   Suanne Marker, MD 03/08/2013, 10:37 AM Certified in Neurology, Neurophysiology and Neuroimaging  [facility] [address] [place], [place] [address] [phone]

## 2013-04-07 ENCOUNTER — Other Ambulatory Visit: Payer: Self-pay

## 2013-04-07 MED ORDER — DANTROLENE SODIUM 50 MG PO CAPS: 50.0000 mg | ORAL_CAPSULE | Freq: Two times a day (BID) | ORAL | Status: DC

## 2013-06-28 ENCOUNTER — Encounter: Payer: Self-pay | Admitting: Physical Medicine & Rehabilitation

## 2013-06-28 ENCOUNTER — Encounter: Payer: Medicare Other | Attending: Physical Medicine & Rehabilitation | Admitting: Physical Medicine & Rehabilitation

## 2013-06-28 VITALS — BP 164/83 | HR 59 | Resp 14 | Ht 67.0 in | Wt 176.2 lb

## 2013-06-28 DIAGNOSIS — I252 Old myocardial infarction: Secondary | ICD-10-CM | POA: Insufficient documentation

## 2013-06-28 DIAGNOSIS — G40909 Epilepsy, unspecified, not intractable, without status epilepticus: Secondary | ICD-10-CM | POA: Insufficient documentation

## 2013-06-28 DIAGNOSIS — I10 Essential (primary) hypertension: Secondary | ICD-10-CM | POA: Insufficient documentation

## 2013-06-28 DIAGNOSIS — G8110 Spastic hemiplegia affecting unspecified side: Secondary | ICD-10-CM

## 2013-06-28 DIAGNOSIS — I739 Peripheral vascular disease, unspecified: Secondary | ICD-10-CM | POA: Insufficient documentation

## 2013-06-28 DIAGNOSIS — I6330 Cerebral infarction due to thrombosis of unspecified cerebral artery: Secondary | ICD-10-CM

## 2013-06-28 DIAGNOSIS — R269 Unspecified abnormalities of gait and mobility: Secondary | ICD-10-CM | POA: Insufficient documentation

## 2013-06-28 DIAGNOSIS — I69959 Hemiplegia and hemiparesis following unspecified cerebrovascular disease affecting unspecified side: Secondary | ICD-10-CM | POA: Insufficient documentation

## 2013-06-28 DIAGNOSIS — I2510 Atherosclerotic heart disease of native coronary artery without angina pectoris: Secondary | ICD-10-CM | POA: Insufficient documentation

## 2013-06-28 DIAGNOSIS — R569 Unspecified convulsions: Secondary | ICD-10-CM

## 2013-06-28 DIAGNOSIS — E785 Hyperlipidemia, unspecified: Secondary | ICD-10-CM | POA: Insufficient documentation

## 2013-06-28 NOTE — Patient Instructions (Signed)
CALL ME WITH ANY PROBLEMS OR QUESTIONS (#[phone]).  HAVE A GOOD DAY   WORK ON STRETCHING YOUR LEFT HIP. WORK ON YOUR GAIT TECHNIQUE

## 2013-06-28 NOTE — Progress Notes (Signed)
Subjective:    Patient ID: [name], male    DOB: [birth year], 67 y.o.   MRN: [id number]  HPI  [name] is back regarding his chronic gait disorder. He was in therapy for a little while per Dr. Sandria Manly and it didn't have a lot of effect.   He feels that he still is not back to his baseline. He is doing some walking with his AFO at home, which is more difficult to him. He moved away from using the stationary bike as it tended to elevate his BP and HR too high. He hasn't had any major falls at home. He is using his old hinged PLAFO currently.   He remains on his dilantin and keppra for seizure prophylaxis.   Pain Inventory Average Pain 0 Pain Right Now 0 My pain is no pain  In the last 24 hours, has pain interfered with the following? General activity 0 Relation with others 0 Enjoyment of life 0 What TIME of day is your pain at its worst? no pain Sleep (in general) no pain  Pain is worse with: no pain Pain improves with: no pain Relief from Meds: no pain  Mobility walk without assistance use a cane how many minutes can you walk? 5 ability to climb steps?  yes do you drive?  yes Do you have any goals in this area?  no  Function disabled: date disabled 12/1997 I need assistance with the following:  meal prep and household duties Do you have any goals in this area?  no  Neuro/Psych weakness numbness  Prior Studies Any changes since last visit?  no  Physicians involved in your care Neurologist Dr. Anise Salvo    Family History  Problem Relation Age of Onset  . Stroke Father    History   Social History  . Marital Status: Divorced    Spouse Name: N/A    Number of Children: N/A  . Years of Education: N/A   Social History Main Topics  . Smoking status: Former Smoker    Types: Cigarettes    Quit date: 11/14/2007  . Smokeless tobacco: Never Used  . Alcohol Use: No  . Drug Use: No  . Sexual Activity: Not on file   Other Topics Concern  . Not on file    Social History Narrative  . No narrative on file   Past Surgical History  Procedure Laterality Date  . Cholecystectomy      Gall Bladder  . Coronary artery bypass graft  11/2002    x3  . Ankle surgery  1998  . Carotid endarterectomy  12/1997    Bilateral   . Carotid endarterectomy  02/12/12    Left Carotid  . Carotid endarterectomy  02/26/12    Right Carotid  . Carotid stents Bilateral   . Carotid endarterectomies Bilateral 12/1997   Past Medical History  Diagnosis Date  . Hyperlipidemia   . Hypertension   . Myocardial infarction 11/2002  . Stroke   . Seizure disorder   . Carotid stent occlusion bilateral 02/2011  . Carotid artery occlusion   . CAD (coronary artery disease)   . Peripheral arterial disease   . Cause of injury, fall 03/01/12  . Seizures    There were no vitals taken for this visit.     Review of Systems  Neurological: Positive for weakness and numbness.       Objective:   Physical Exam Constitutional: He is oriented to person, place, and time. He appears well-developed and  well-nourished.  HENT:  Head: Normocephalic and atraumatic.  Right Ear: External ear normal.  Left Ear: External ear normal.  Nose: Nose normal.  Mouth/Throat: Oropharynx is clear and moist.  Eyes: Conjunctivae and EOM are normal. Pupils are equal, round, and reactive to light.  Neck: Normal range of motion. Neck supple.  Cardiovascular: Normal rate and regular rhythm.  Pulmonary/Chest: Effort normal.  Abdominal: Soft. Bowel sounds are normal.  Musculoskeletal: Normal range of motion.  Neurological: He is alert and oriented to person, place, and time. A cranial nerve deficit is present.  LLE is 1-3/5. LUE is 1-3/5. Spasticity is 1/4. Sensation 1/2 in both upper and lower.  Psychiatric: He has a normal mood and affect. His behavior is normal. Judgment and thought content normal.  The patient walks with a wide based gait. He continues to have hyperextension at the left knee with  weight bearing. He externally rotates the left leg to help clear foot in swing phase. He appears generally stable. He is able to change directions with extra time. Not once did he appear to be off balance.  Cognitively he appears to be in her baseline.   Assessment & Plan:   ASSESSMENT:  1. Right cerebrovascular accident, spastic left hemiparesis.  2. Seizure disorder.  3. Gait disorder.   PLAN:  1. Continue with HEP to focus on form and technique. Would like him to work on stretching hip External rotators. i would also like him to work with his carbon AFO.  2. Continue with Dilantin and keppra per neuro recs.  3. Dantrium will be continued for spasticity management.  4. 12 months follow up. All questions were encouraged and answered. He will call me back prn.

## 2013-07-07 ENCOUNTER — Encounter: Payer: Self-pay | Admitting: Cardiovascular Disease

## 2013-07-13 ENCOUNTER — Other Ambulatory Visit: Payer: Self-pay | Admitting: Cardiovascular Disease

## 2013-07-13 LAB — TSH: TSH: 3.245 u[IU]/mL (ref 0.350–4.500)

## 2013-07-13 LAB — CBC WITH DIFFERENTIAL/PLATELET
Basophils Absolute: 0 10*3/uL (ref 0.0–0.1)
Basophils Relative: 1 % (ref 0–1)
Eosinophils Absolute: 0.4 10*3/uL (ref 0.0–0.7)
Eosinophils Relative: 5 % (ref 0–5)
HCT: 42 % (ref 39.0–52.0)
Hemoglobin: 14.3 g/dL (ref 13.0–17.0)
Lymphocytes Relative: 27 % (ref 12–46)
Lymphs Abs: 2.1 10*3/uL (ref 0.7–4.0)
MCH: 33.3 pg (ref 26.0–34.0)
MCHC: 34 g/dL (ref 30.0–36.0)
MCV: 97.9 fL (ref 78.0–100.0)
Monocytes Absolute: 0.9 10*3/uL (ref 0.1–1.0)
Monocytes Relative: 11 % (ref 3–12)
Neutro Abs: 4.4 10*3/uL (ref 1.7–7.7)
Neutrophils Relative %: 56 % (ref 43–77)
Platelets: 183 10*3/uL (ref 150–400)
RBC: 4.29 MIL/uL (ref 4.22–5.81)
RDW: 14 % (ref 11.5–15.5)
WBC: 7.7 10*3/uL (ref 4.0–10.5)

## 2013-07-13 LAB — LIPID PANEL
Cholesterol: 143 mg/dL (ref 0–200)
HDL: 32 mg/dL — ABNORMAL LOW (ref >39)
LDL Cholesterol: 63 mg/dL (ref 0–99)
Total CHOL/HDL Ratio: 4.5 Ratio
Triglycerides: 240 mg/dL — ABNORMAL HIGH (ref <150)
VLDL: 48 mg/dL — ABNORMAL HIGH (ref 0–40)

## 2013-07-13 LAB — COMPREHENSIVE METABOLIC PANEL
ALT: 26 U/L (ref 0–53)
AST: 25 U/L (ref 0–37)
Albumin: 4.3 g/dL (ref 3.5–5.2)
Alkaline Phosphatase: 101 U/L (ref 39–117)
BUN: 16 mg/dL (ref 6–23)
CO2: 25 mEq/L (ref 19–32)
Calcium: 9.2 mg/dL (ref 8.4–10.5)
Chloride: 103 mEq/L (ref 96–112)
Creat: 1.11 mg/dL (ref 0.50–1.35)
Glucose, Bld: 107 mg/dL — ABNORMAL HIGH (ref 70–99)
Potassium: 5 mEq/L (ref 3.5–5.3)
Sodium: 138 mEq/L (ref 135–145)
Total Bilirubin: 0.4 mg/dL (ref 0.3–1.2)
Total Protein: 6.9 g/dL (ref 6.0–8.3)

## 2013-07-13 LAB — PHENYTOIN LEVEL, TOTAL: Phenytoin Lvl: 16.1 ug/mL (ref 10.0–20.0)

## 2013-07-14 LAB — LEVETIRACETAM LEVEL: Keppra (Levetiracetam): 19.8 ug/mL (ref 5.0–30.0)

## 2013-07-29 ENCOUNTER — Telehealth: Payer: Self-pay | Admitting: Cardiovascular Disease

## 2013-07-29 NOTE — Telephone Encounter (Signed)
Had labs drawn 07/13/13 - do not know which MD these resulted to - will ask JC if he knows.

## 2013-07-29 NOTE — Telephone Encounter (Signed)
Called patient - spoke with caregiver Adela Lank. Informed that labs have not been reviewed by MD yet - as Dr. Alanda Amass has retired, but that they would be notified as soon as they are resulted to whichever MD's nurse. Verbalized understanding.

## 2013-07-29 NOTE — Telephone Encounter (Signed)
Wants lab results from the end of September please.

## 2013-08-12 ENCOUNTER — Other Ambulatory Visit: Payer: Self-pay | Admitting: Neurology

## 2013-08-12 ENCOUNTER — Telehealth: Payer: Self-pay | Admitting: Cardiovascular Disease

## 2013-08-12 NOTE — Telephone Encounter (Signed)
Lab results verbally given and copy mailed to pt

## 2013-08-12 NOTE — Telephone Encounter (Signed)
Message forwarded to J.C. Wildman, LPN.  

## 2013-08-12 NOTE — Telephone Encounter (Signed)
Need to get results of most recent labs.  Also insurance denying Crestor 1 and 1/2 pilll per day says can only approve 1 pill per day.  Can Dr ask for exception so that can continue on what was prescribed.  Please call

## 2013-08-13 ENCOUNTER — Emergency Department (HOSPITAL_COMMUNITY)
Admission: EM | Admit: 2013-08-13 | Discharge: 2013-08-13 | Disposition: A | Discharge status: Home / Self Care | Payer: Medicare Other | Attending: Emergency Medicine | Admitting: Emergency Medicine

## 2013-08-13 ENCOUNTER — Encounter (HOSPITAL_COMMUNITY): Payer: Self-pay | Admitting: Emergency Medicine

## 2013-08-13 DIAGNOSIS — Z87891 Personal history of nicotine dependence: Secondary | ICD-10-CM | POA: Insufficient documentation

## 2013-08-13 DIAGNOSIS — Z951 Presence of aortocoronary bypass graft: Secondary | ICD-10-CM | POA: Insufficient documentation

## 2013-08-13 DIAGNOSIS — S01312A Laceration without foreign body of left ear, initial encounter: Secondary | ICD-10-CM

## 2013-08-13 DIAGNOSIS — W010XXA Fall on same level from slipping, tripping and stumbling without subsequent striking against object, initial encounter: Secondary | ICD-10-CM | POA: Insufficient documentation

## 2013-08-13 DIAGNOSIS — Z79899 Other long term (current) drug therapy: Secondary | ICD-10-CM | POA: Insufficient documentation

## 2013-08-13 DIAGNOSIS — I10 Essential (primary) hypertension: Secondary | ICD-10-CM | POA: Insufficient documentation

## 2013-08-13 DIAGNOSIS — I252 Old myocardial infarction: Secondary | ICD-10-CM | POA: Insufficient documentation

## 2013-08-13 DIAGNOSIS — Y939 Activity, unspecified: Secondary | ICD-10-CM | POA: Insufficient documentation

## 2013-08-13 DIAGNOSIS — S01309A Unspecified open wound of unspecified ear, initial encounter: Secondary | ICD-10-CM | POA: Insufficient documentation

## 2013-08-13 DIAGNOSIS — Y9289 Other specified places as the place of occurrence of the external cause: Secondary | ICD-10-CM | POA: Insufficient documentation

## 2013-08-13 DIAGNOSIS — G40909 Epilepsy, unspecified, not intractable, without status epilepticus: Secondary | ICD-10-CM | POA: Insufficient documentation

## 2013-08-13 DIAGNOSIS — E785 Hyperlipidemia, unspecified: Secondary | ICD-10-CM | POA: Insufficient documentation

## 2013-08-13 DIAGNOSIS — Z8673 Personal history of transient ischemic attack (TIA), and cerebral infarction without residual deficits: Secondary | ICD-10-CM | POA: Insufficient documentation

## 2013-08-13 DIAGNOSIS — I69959 Hemiplegia and hemiparesis following unspecified cerebrovascular disease affecting unspecified side: Secondary | ICD-10-CM | POA: Insufficient documentation

## 2013-08-13 DIAGNOSIS — I2510 Atherosclerotic heart disease of native coronary artery without angina pectoris: Secondary | ICD-10-CM | POA: Insufficient documentation

## 2013-08-13 DIAGNOSIS — S0990XA Unspecified injury of head, initial encounter: Secondary | ICD-10-CM | POA: Insufficient documentation

## 2013-08-13 NOTE — ED Notes (Signed)
Pt wife stated was sent over from Urgent care to reassess the laceration on left ear, pt denies any pain at this time. Pt has hx of falls according to wife. Hx of stroke in 1999 with right side residual and walks with a cane.

## 2013-08-13 NOTE — ED Provider Notes (Signed)
CSN: [id number]     Arrival date & time 08/13/13  1241 History   First MD Initiated Contact with Patient 08/13/13 1351     Chief Complaint  Patient presents with  . Fall   (Consider location/radiation/quality/duration/timing/severity/associated sxs/prior Treatment) HPI  [name] is a 67 y.o. male complaining of slip and fall in the bathroom earlier in the day. She thinks he hit his head on the countertop. Patient had head trauma with laceration to left ear. Patient denies syncope, loss of consciousness, nausea vomiting, headache, change in vision, unilateral weakness, dysarthria, ataxia. The patient has a left-sided hemiplegia secondary to prior CVA. Note that weakness is not worsened from his baseline. Patient was seen at urgent care and sent to the ED for evaluation. Patient takes Aggrenox. Last tetanus shot 2011.    Past Medical History  Diagnosis Date  . Hyperlipidemia   . Hypertension   . Myocardial infarction 11/2002  . Stroke   . Seizure disorder   . Carotid stent occlusion bilateral 02/2011  . Carotid artery occlusion   . CAD (coronary artery disease)   . Peripheral arterial disease   . Cause of injury, fall 03/01/12  . Seizures    Past Surgical History  Procedure Laterality Date  . Cholecystectomy      Gall Bladder  . Coronary artery bypass graft  11/2002    x3  . Ankle surgery  1998  . Carotid endarterectomy  12/1997    Bilateral   . Carotid endarterectomy  02/12/12    Left Carotid  . Carotid endarterectomy  02/26/12    Right Carotid  . Carotid stents Bilateral   . Carotid endarterectomies Bilateral 12/1997   Family History  Problem Relation Age of Onset  . Stroke Father    History  Substance Use Topics  . Smoking status: Former Smoker    Types: Cigarettes    Quit date: 11/14/2007  . Smokeless tobacco: Never Used  . Alcohol Use: No    Review of Systems 10 systems reviewed and found to be negative, except as noted in the HPI  Allergies  Review of  patient's allergies indicates no known allergies.  Home Medications   Current Outpatient Rx  Name  Route  Sig  Dispense  Refill  . Coenzyme Q10 (COQ10) 50 MG CAPS   Oral   Take 100 mg by mouth daily.          . dantrolene (DANTRIUM) 50 MG capsule   Oral   Take 1 capsule (50 mg total) by mouth 2 (two) times daily.   180 capsule   2   . dipyridamole-aspirin (AGGRENOX) 200-25 MG per 12 hr capsule   Oral   Take 1 capsule by mouth 2 (two) times daily.   60 capsule   6   . ezetimibe (ZETIA) 10 MG tablet   Oral   Take 10 mg by mouth daily.           . Flaxseed, Linseed, 1000 MG CAPS   Oral   Take 1 capsule by mouth daily.          . folic acid-pyridoxine-cyancobalamin (FOLTX) 2.5-25-2 MG TABS   Oral   Take 1 tablet by mouth daily.           Marland Kitchen levETIRAcetam (KEPPRA) 500 MG tablet   Oral   Take 1 tablet (500 mg total) by mouth 2 (two) times daily.   60 tablet   6     Dispense as written.   Marland Kitchen  Magnesium 250 MG TABS   Oral   Take 1 tablet by mouth daily.           . metoprolol tartrate (LOPRESSOR) 25 MG tablet   Oral   Take 25 mg by mouth daily.           Marland Kitchen olmesartan (BENICAR) 20 MG tablet   Oral   Take 20 mg by mouth daily.           . phenytoin (DILANTIN) 100 MG ER capsule   Oral   Take 1 capsule (100 mg total) by mouth 3 (three) times daily.   90 capsule   6   . rosuvastatin (CRESTOR) 20 MG tablet   Oral   Take 30 mg by mouth daily.           . vitamin C (ASCORBIC ACID) 500 MG tablet   Oral   Take 500 mg by mouth daily.            BP 165/78  Pulse 60  Temp(Src) 98.1 F (36.7 C) (Oral)  Resp 25  Ht 5\' 7"  (1.702 m)  Wt 177 lb (80.287 kg)  BMI 27.72 kg/m2  SpO2 95% Physical Exam  Nursing note and vitals reviewed. Constitutional: He is oriented to person, place, and time. He appears well-developed and well-nourished. No distress.  HENT:  Head: Normocephalic and atraumatic.  Ears:  Mouth/Throat: Oropharynx is clear and moist.    Eyes: Conjunctivae and EOM are normal. Pupils are equal, round, and reactive to light.  Neck: Normal range of motion. Neck supple.  No midline tenderness to palpation or step-offs appreciated. Patient has full range of motion without pain.   Cardiovascular: Normal rate, regular rhythm, normal heart sounds and intact distal pulses.   Pulmonary/Chest: Effort normal and breath sounds normal. No stridor. No respiratory distress. He has no wheezes. He has no rales. He exhibits no tenderness.  Abdominal: Soft. Bowel sounds are normal. He exhibits no distension and no mass. There is no tenderness. There is no rebound and no guarding.  Musculoskeletal: Normal range of motion. He exhibits no edema.  Neurological: He is alert and oriented to person, place, and time.  Follows commands, Goal oriented speech, Strength is 5 out of 5x4 extremities, patient ambulates with a coordinated in nonantalgic gait. Sensation is grossly intact.   Skin:  Full-thickness laceration into left ear and the helix.   Psychiatric: He has a normal mood and affect.    ED Course  Procedures (including critical care time)  LACERATION REPAIR Performed by: Wynetta Emery Authorized by: Wynetta Emery Consent: Verbal consent obtained. Risks and benefits: risks, benefits and alternatives were discussed Consent given by: patient Patient identity confirmed: Wrist band  Prepped and Draped in normal sterile fashion  Laceration Location: Left ear  Laceration Length: 2 cm  Anesthesia: Ring block   Local anesthetic: 2% with epinephrine  Anesthetic total: 6 ml  Irrigation method: syringe  Amount of cleaning: copious   Wound explored to depth in good light on a bloodless field with no foreign bodies seen or palpated.   Skin closure: 6-0 Prolene   Number of sutures: 7   Technique: Running locking   Patient tolerance: Patient tolerated the procedure well with no immediate complications.  Antibx ointment  applied. Instructions for care discussed verbally and patient provided with additional written instructions for homecare and f/u.  Labs Review Labs Reviewed - No data to display Imaging Review No results found.  EKG Interpretation   None  MDM   1. Laceration of ear, left, initial encounter    Filed Vitals:   08/13/13 1415 08/13/13 1430 08/13/13 1445 08/13/13 1458  BP: 157/67 155/69 165/78 178/74  Pulse: 57 56 60 58  Temp:    98.2 F (36.8 C)  TempSrc:    Oral  Resp:    18  Height:      Weight:      SpO2: 99% 99% 95% 99%     [name] is a 67 y.o. male  with laceration to left ear, with closed and pressure dressing is applied. Patient is taking Aggrenox which is a aspirin antacid combination. Neuro exam is normal, no indication for head CT at this time.  Pt is hemodynamically stable, appropriate for, and amenable to discharge at this time. Pt verbalized understanding and agrees with care plan. All questions answered. Outpatient follow-up and specific return precautions discussed.    Note: Portions of this report may have been transcribed using voice recognition software. Every effort was made to ensure accuracy; however, inadvertent computerized transcription errors may be present      Wynetta Emery, PA-C 08/13/13 1632

## 2013-08-13 NOTE — ED Provider Notes (Signed)
Medical screening examination/treatment/procedure(s) were conducted as a shared visit with non-physician practitioner(s) or resident  and myself.  I personally evaluated the patient during the encounter and agree with the findings and plan unless otherwise indicated.    I have personally reviewed any xrays and/ or EKG's with the provider and I agree with interpretation.   Mechanical fall, left ear laceration.  Pt at baseline neurologically, hx of stroke.  No ha, no vomiting.  On aggrenox.  No neck pain, full rom. Nl strength bilateral.  Laceration repaired by PA, stressed compression dressing and close fup to decrease risk of cartilage damage.  Pt comfortable with plan.  Close fup discussed.   Ear laceration left, head injury, Lindwood Qua, MD 08/13/13 1750

## 2013-08-13 NOTE — ED Notes (Signed)
Pt here from [facility] for fall, pt sts that he got his legs twisted up when trying to stand. Lac to ear now no bleeding noted, pt is on aggrenox, denies loc.

## 2013-08-24 ENCOUNTER — Other Ambulatory Visit: Payer: Self-pay | Admitting: Diagnostic Neuroimaging

## 2013-08-24 MED ORDER — FA-PYRIDOXINE-CYANOCOBALAMIN 2.5-25-2 MG PO TABS: 1.0000 | ORAL_TABLET | Freq: Every day | ORAL | Status: DC

## 2013-08-25 ENCOUNTER — Other Ambulatory Visit: Payer: Self-pay | Admitting: Neurology

## 2013-08-25 MED ORDER — ASPIRIN-DIPYRIDAMOLE ER 25-200 MG PO CP12: ORAL_CAPSULE | ORAL | Status: DC

## 2013-08-25 NOTE — Telephone Encounter (Signed)
Patient wanted a 90 day supply, refaxed the prescription in with dispensing 180 capsules with 1 refill.

## 2013-10-12 ENCOUNTER — Other Ambulatory Visit: Payer: Self-pay | Admitting: Neurology

## 2013-10-18 ENCOUNTER — Other Ambulatory Visit: Payer: Self-pay

## 2013-10-18 MED ORDER — PHENYTOIN SODIUM EXTENDED 100 MG PO CAPS: 100.0000 mg | ORAL_CAPSULE | Freq: Three times a day (TID) | ORAL | Status: DC

## 2013-10-18 NOTE — Telephone Encounter (Signed)
Pharmacy requests 90 day Rx  

## 2013-11-07 ENCOUNTER — Other Ambulatory Visit: Payer: Medicare Other

## 2013-11-07 ENCOUNTER — Ambulatory Visit: Payer: Medicare Other | Admitting: Neurosurgery

## 2013-11-11 ENCOUNTER — Encounter: Payer: Self-pay | Admitting: Family

## 2013-11-11 ENCOUNTER — Other Ambulatory Visit: Payer: Self-pay | Admitting: Diagnostic Neuroimaging

## 2013-11-14 ENCOUNTER — Ambulatory Visit (HOSPITAL_COMMUNITY)
Admission: RE | Admit: 2013-11-14 | Discharge: 2013-11-14 | Disposition: A | Discharge status: Home / Self Care | Payer: Medicare Other | Source: Ambulatory Visit | Attending: Family | Admitting: Family

## 2013-11-14 ENCOUNTER — Ambulatory Visit (INDEPENDENT_AMBULATORY_CARE_PROVIDER_SITE_OTHER): Payer: Medicare Other | Admitting: Family

## 2013-11-14 ENCOUNTER — Encounter: Payer: Self-pay | Admitting: Family

## 2013-11-14 VITALS — BP 125/80 | HR 56 | Resp 16 | Ht 67.0 in | Wt 177.0 lb

## 2013-11-14 DIAGNOSIS — Z48812 Encounter for surgical aftercare following surgery on the circulatory system: Secondary | ICD-10-CM

## 2013-11-14 DIAGNOSIS — I6529 Occlusion and stenosis of unspecified carotid artery: Secondary | ICD-10-CM | POA: Insufficient documentation

## 2013-11-14 NOTE — Telephone Encounter (Signed)
[facility] patient-He filled this Rx for many years  

## 2013-11-14 NOTE — Progress Notes (Signed)
Established Carotid Patient   History of Present Illness  [name] is a 68 y.o. male who is s/p bilateral carotid endarterectomy in 1999; right ICA stent 02/26/2011, and left ICA stent 02/12/2011.  He had a right hemispheric stroke in 1999, has residual left hemiparesis, loss of vision of left eye lower field of vision, denies expressive aphasia, still has minor trouble with balance, uses a cane. Seizure activity developed after external (on arm) stimulator to brain was tried in attempts to help him gain function of his left arm. Pt denies claudication symptoms. He takes dantrolene for muscle spacticity.   Pt Diabetic: No Pt smoker: former smoker, quit 10 years ago  Pt meds include: Statin : Yes ASA: Yes, aggenox Other anticoagulants/antiplatelets: aggrenox   Past Medical History  Diagnosis Date  . Hyperlipidemia   . Hypertension   . Myocardial infarction 11/2002  . Stroke   . Seizure disorder   . Carotid stent occlusion bilateral 02/2011  . Carotid artery occlusion   . CAD (coronary artery disease)   . Peripheral arterial disease   . Cause of injury, fall 03/01/12  . Seizures     Social History History  Substance Use Topics  . Smoking status: Former Smoker    Types: Cigarettes    Quit date: 11/14/2007  . Smokeless tobacco: Never Used  . Alcohol Use: No    Family History Family History  Problem Relation Age of Onset  . Stroke Father     Surgical History Past Surgical History  Procedure Laterality Date  . Cholecystectomy      Gall Bladder  . Coronary artery bypass graft  11/2002    x3  . Ankle surgery  1998  . Carotid endarterectomy  12/1997    Bilateral   . Carotid endarterectomy  02/12/12    Left Carotid  . Carotid endarterectomy  02/26/12    Right Carotid  . Carotid stents Bilateral   . Carotid endarterectomies Bilateral 12/1997    No Known Allergies  Current Outpatient Prescriptions  Medication Sig Dispense Refill  . Coenzyme Q10 (COQ10) 50 MG  CAPS Take 100 mg by mouth daily.       . dantrolene (DANTRIUM) 50 MG capsule Take 1 capsule (50 mg total) by mouth 2 (two) times daily.  180 capsule  2  . dipyridamole-aspirin (AGGRENOX) 200-25 MG per 12 hr capsule TAKE 1 CAPSULE BY MOUTH 2 (TWO) TIMES DAILY.  180 capsule  1  . ezetimibe (ZETIA) 10 MG tablet Take 10 mg by mouth daily.        . Flaxseed, Linseed, 1000 MG CAPS Take 1 capsule by mouth daily.       . folic acid-pyridoxine-cyancobalamin (FOLTX) 2.5-25-2 MG TABS Take 1 tablet by mouth daily.  30 each  3  . KEPPRA 500 MG tablet TAKE 1 TABLET (500 MG TOTAL) BY MOUTH 2 (TWO) TIMES DAILY.  60 tablet  5  . Magnesium 250 MG TABS Take 1 tablet by mouth daily.        . metoprolol tartrate (LOPRESSOR) 25 MG tablet Take 25 mg by mouth daily.        Marland Kitchen olmesartan (BENICAR) 20 MG tablet Take 20 mg by mouth daily.        . phenytoin (DILANTIN) 100 MG ER capsule Take 1 capsule (100 mg total) by mouth 3 (three) times daily.  270 capsule  1  . rosuvastatin (CRESTOR) 20 MG tablet Take 30 mg by mouth daily.        Marland Kitchen  vitamin C (ASCORBIC ACID) 500 MG tablet Take 500 mg by mouth daily.         No current facility-administered medications for this visit.    Review of Systems : See HPI for pertinent positives and negatives.  Physical Examination  Filed Vitals:   11/14/13 1405  BP: 125/80  Pulse: 56  Resp: 16   Filed Weights   11/14/13 1405  Weight: 177 lb (80.287 kg)   Body mass index is 27.72 kg/(m^2).  General: WDWN male in NAD GAIT: spastic, using cane Eyes: PERRLA Pulmonary:  CTAB, Negative  Rales, Negative rhonchi, & Negative wheezing.  Cardiac: regular Rhythm ,  Negative Murmurs.  VASCULAR EXAM Carotid Bruits Left Right   Negative Negative   Radial pulses are 2+ palpable and equal.                                                                                                                            LE Pulses LEFT RIGHT       POPLITEAL  not palpable   not palpable     Gastrointestinal: soft, nontender, BS WNL, no r/g,  negative masses.  Musculoskeletal: Positive muscle atrophy/wasting in left arm and leg, right angle ankle brace in place on left ankle. M/S 5/5 in right upper and lower extremities, 2/5 in left upper and lower extremities, Extremities without ischemic changes. 1/5 strength in left hand  Neurologic: A&O X 3; Appropriate Affect ; SENSATION ;normal;  Speech is normal CN 2-12 intact  Except mild deviation to the right of tongue, left facial droop with smile, Motor exam as listed above.   Non-Invasive Vascular Imaging CAROTID DUPLEX 11/14/2013   CEREBROVASCULAR DUPLEX EVALUATION     INDICATION: Carotid artery disease; evaluation status post stent placement.    PREVIOUS INTERVENTION(S): Bilateral carotid endarterectomy 1999; right carotid stent 02/26/2011; left carotid stent 02/12/2011    DUPLEX EXAM:     RIGHT  LEFT  Peak Systolic Velocities (cm/s) End Diastolic Velocities (cm/s) Plaque LOCATION Peak Systolic Velocities (cm/s) End Diastolic Velocities (cm/s) Plaque  66 9  CCA PROXIMAL 79 21   86 19 HM CCA MID 94 30   Stent   CCA DISTAL Stent    185 17 HT ECA 103 18   Stent   ICA PROXIMAL Stent    72 24  ICA MID 90 28   62 17  ICA DISTAL 57 18     NA ICA / CCA Ratio (PSV) NA  Antegrade Vertebral Flow Antegrade  948 Brachial Systolic Pressure (mmHg) 546  Within normal limits Brachial Artery Waveforms Within normal limits    Peak Systolic Velocities (cm/s) End Diastolic Velocities (cm/s) Plaque STENT (Bilateral ) Peak Systolic Velocities (cm/s) End Diastolic Velocities (cm/s) Plaque  86 17  PROXIMAL 86 23   59 13  MID 86 20   76 20  DISTAL 66 24    1. Widely patent bilateral carotid artery stent without evidence of restenosis or hyperplasia.  2. Mild (<50%) disease observed in the right mid-common carotid artery. 3. Bilateral vertebral artery is antegrade.    Compared to the previous exam:  No significant change compared to  previous exam.    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 male who presents with asymptomatic widely patent bilateral carotid artery stent without evidence of restenosis or hyperplasia.  Mild (<50%) disease observed in the right mid-common carotid artery. Bilateral vertebral artery is antegrade. The  ICA stenosis is  Unchanged from previous exam.  Plan: Follow-up in 1 year with Carotid Duplex scan.   I discussed in depth with the patient the nature of atherosclerosis, and emphasized the importance of maximal medical management including strict control of blood pressure, blood glucose, and lipid levels, obtaining regular exercise, and continued cessation of smoking.  The patient is aware that without maximal medical management the underlying atherosclerotic disease process will progress, limiting the benefit of any interventions. The patient was given information about stroke prevention and what symptoms should prompt the patient to seek immediate medical care. Thank you for allowing [place] to participate in this patient's care.  Clemon Chambers, RN, MSN, FNP-C [facility] Office: [phone]  Clinic Physician: Trula Slade  11/14/2013 3:05 PM

## 2013-11-14 NOTE — Patient Instructions (Signed)

## 2013-11-16 ENCOUNTER — Other Ambulatory Visit: Payer: Self-pay | Admitting: *Deleted

## 2013-11-16 DIAGNOSIS — I6529 Occlusion and stenosis of unspecified carotid artery: Secondary | ICD-10-CM

## 2013-11-23 ENCOUNTER — Other Ambulatory Visit: Payer: Self-pay | Admitting: Neurology

## 2013-12-07 ENCOUNTER — Other Ambulatory Visit: Payer: Self-pay | Admitting: Neurology

## 2013-12-08 NOTE — Telephone Encounter (Signed)
[facility] patient assigned to Dr Penumalli. 

## 2013-12-13 ENCOUNTER — Other Ambulatory Visit: Payer: Self-pay

## 2013-12-13 MED ORDER — OLMESARTAN MEDOXOMIL 20 MG PO TABS: 20.0000 mg | ORAL_TABLET | Freq: Every day | ORAL | Status: DC

## 2013-12-13 NOTE — Telephone Encounter (Signed)
Rx was sent to pharmacy electronically. 

## 2013-12-29 ENCOUNTER — Other Ambulatory Visit: Payer: Self-pay | Admitting: Physical Medicine & Rehabilitation

## 2014-01-11 ENCOUNTER — Other Ambulatory Visit: Payer: Self-pay | Admitting: Neurology

## 2014-01-13 ENCOUNTER — Other Ambulatory Visit: Payer: Self-pay | Admitting: *Deleted

## 2014-01-13 MED ORDER — EZETIMIBE 10 MG PO TABS: 10.0000 mg | ORAL_TABLET | Freq: Every day | ORAL | Status: DC

## 2014-01-13 NOTE — Telephone Encounter (Signed)
Rx refill sent to patients pharmacy  

## 2014-01-20 ENCOUNTER — Telehealth: Payer: Self-pay

## 2014-01-20 NOTE — Telephone Encounter (Signed)
Prior authorization for Crestor 30 mg faxed to patient's insurance company via [email or web address] Awaiting approval.

## 2014-02-02 NOTE — Telephone Encounter (Signed)
Prior authorization for Crestor 30 mg has been approved through 10/12/2014.

## 2014-02-08 NOTE — Telephone Encounter (Signed)
Opened in error

## 2014-02-15 ENCOUNTER — Telehealth: Payer: Self-pay | Admitting: Cardiovascular Disease

## 2014-02-15 MED ORDER — ROSUVASTATIN CALCIUM 20 MG PO TABS: 30.0000 mg | ORAL_TABLET | Freq: Every day | ORAL | Status: DC

## 2014-02-15 NOTE — Telephone Encounter (Signed)
Returned call and spoke w/ Colletta [place].  Stated a refill request was faxed for Crestor for this pt, but they received a refill for a pt w/ the same name and different DOB.  RN looked up other pt and refill was sent for that pt who has not been seen in office.  VO to refill Crestor 20 mg Take 1.5 tabs daily, dispense 135 no refills.  Asked her to have pt call for an appt.  Verbalized understanding and agreed w/ plan.

## 2014-02-15 NOTE — Telephone Encounter (Signed)
Please call-prescription had a different birth date,

## 2014-03-06 ENCOUNTER — Other Ambulatory Visit: Payer: Self-pay | Admitting: Internal Medicine

## 2014-03-07 NOTE — Telephone Encounter (Signed)
Refill refused. Refill requested too soon

## 2014-03-08 ENCOUNTER — Encounter: Payer: Self-pay | Admitting: Diagnostic Neuroimaging

## 2014-03-08 ENCOUNTER — Encounter (INDEPENDENT_AMBULATORY_CARE_PROVIDER_SITE_OTHER): Payer: Self-pay

## 2014-03-08 ENCOUNTER — Ambulatory Visit (INDEPENDENT_AMBULATORY_CARE_PROVIDER_SITE_OTHER): Payer: Medicare Other | Admitting: Diagnostic Neuroimaging

## 2014-03-08 VITALS — BP 177/85 | HR 66 | Temp 97.4°F | Ht 67.0 in | Wt 183.0 lb

## 2014-03-08 DIAGNOSIS — G40909 Epilepsy, unspecified, not intractable, without status epilepticus: Secondary | ICD-10-CM

## 2014-03-08 DIAGNOSIS — G20 Parkinson's disease: Secondary | ICD-10-CM

## 2014-03-08 DIAGNOSIS — Z8673 Personal history of transient ischemic attack (TIA), and cerebral infarction without residual deficits: Secondary | ICD-10-CM

## 2014-03-08 DIAGNOSIS — I6930 Unspecified sequelae of cerebral infarction: Secondary | ICD-10-CM

## 2014-03-08 MED ORDER — PHENYTOIN SODIUM EXTENDED 100 MG PO CAPS: 100.0000 mg | ORAL_CAPSULE | Freq: Three times a day (TID) | ORAL | Status: DC

## 2014-03-08 MED ORDER — KEPPRA 500 MG PO TABS
500.0000 mg | ORAL_TABLET | Freq: Two times a day (BID) | ORAL | Status: DC
Start: 2014-03-08 — End: 2014-09-13

## 2014-03-08 NOTE — Patient Instructions (Signed)
Less internet and ipad.   More exercise.  Keep up the good work.

## 2014-03-08 NOTE — Progress Notes (Signed)
[facility]  PATIENT: [name] DOB: 11/07/1945  REFERRING CLINICIAN:  HISTORY FROM: patient and wife REASON FOR VISIT: follow up   HISTORICAL  CHIEF COMPLAINT:  Chief Complaint  Patient presents with  . Follow-up    stroke    HISTORY OF PRESENT ILLNESS:   UPDATE 03/08/14: Since last visit, no seizures. Has been falling more. Has noticed hand writing deterioration over past 3 years. Also intermittent right hand tremor x 3-6 months (per wife). Patient noted some right hand tremor even in 1999.  UPDATE 03/08/13: Since last visit patient doing well. No seizures since 2005. Patient is stable on Dilantin and Keppra brand name medications. Patient's swallowing has improved since last visit. He make sure to take liquids following each mouthful of food.  PRIOR HPI (08/27/12, Dr. Erling Cruz): 68 year old right-handed white divorced male from [place], [place] who is cared for by his ex-first wife. He has a known history of hypertension, right MCA stroke 3/99 associated with residual left hemiparesis, acute right MCA stroke 09/2003,coronary artery disease status post 3 vessel bypass in 2004, bilateral carotid endarterectomies in 1999 and bilateral stent placement 5/12, recurrent TIAs versus seizures, documented seizure 11/12/2006, and Dilantin toxicity. His last MRI of the brain without contrast 11/13/2006 showed a large chronic right middle artery infarct involving the posterior temporal and parietal lobe and right thalamus. MR angiogram of the head without contrast showed intracranial atherosclerotic disease and occlusion of the right middle cerebral artery branch corresponding to the right MCA infarct. There was mild atherosclerotic vascular disease elsewhere and no aneurysm. Doppler study of the carotids 3/5/009 was negative for hemodynamically significant stenosis in the intracranial arteries.  EEG 11/13/2006 showed rhythmic discharges correlating with the EKG and were  thought to be artifact. . His last seizure was 11/18/2006. He subsequently had one episode of confusion 11/26/2009 which might have been a seizure. He was at his son's house, came home, looked in the  driveway, and asked where his car was. His car had been given to someone else in the family. He kept asking about the car and recognized that he was confused. The episode lasted 40 minutes. The patient lives with his caretaker. He is able to bathe himself and  take care of his toileting needs, but requires assistance in dressing. He  exercises at the [facility] 2 days per week. He has falls with a fall assessment tool score of 10. He denies macropsia, micropsia, strange odors or tastes. His caretaker reports that at times he has left knee hyperextension. He uses a left leg brace. He denies TIA symptoms or warnings of seizures.He has finished 2 months of PT in no rehabilitation and has a new left foot brace, which is not associated with hyperextension of his knee. He denies spasticity or spasms. He enjoys playing "We" 2 times per day for exercise.His wife reports that he chokes on his saliva. He has not had  unexplained fever or chills. Last trough levetiracetam level 15.9  and phenytoin level 13.4 on 03/11/2012.  REVIEW OF SYSTEMS: Full 14 system review of systems performed and notable only for tremors, drooling.   ALLERGIES: No Known Allergies  HOME MEDICATIONS: Outpatient Prescriptions Prior to Visit  Medication Sig Dispense Refill  . AGGRENOX 25-200 MG per 12 hr capsule TAKE 1 CAPSULE BY MOUTH 2 (TWO) TIMES DAILY.  180 capsule  1  . Coenzyme Q10 (COQ10) 50 MG CAPS Take 100 mg by mouth daily.       . dantrolene (  DANTRIUM) 50 MG capsule TAKE 1 CAPSULE (50 MG TOTAL) BY MOUTH 2 (TWO) TIMES DAILY.  180 capsule  1  . DILANTIN 100 MG ER capsule TAKE 1 CAPSULE BY MOUTH THREE TIMES DAILY  90 capsule  1  . ezetimibe (ZETIA) 10 MG tablet Take 1 tablet (10 mg total) by mouth daily.  90 tablet  1  . Flaxseed, Linseed, 1000  MG CAPS Take 1 capsule by mouth daily.       . FOLBIC 2.5-25-2 MG TABS TAKE 1 TABLET BY MOUTH DAILY.  90 tablet  2  . KEPPRA 500 MG tablet TAKE ONE TABLET BY MOUTH TWICE DAILY  180 tablet  0  . Magnesium 250 MG TABS Take 1 tablet by mouth daily.        . metoprolol tartrate (LOPRESSOR) 25 MG tablet Take 25 mg by mouth daily.        Marland Kitchen olmesartan (BENICAR) 20 MG tablet Take 1 tablet (20 mg total) by mouth daily.  90 tablet  1  . rosuvastatin (CRESTOR) 20 MG tablet Take 1.5 tablets (30 mg total) by mouth daily.  135 tablet  0  . vitamin C (ASCORBIC ACID) 500 MG tablet Take 500 mg by mouth daily.        . phenytoin (DILANTIN) 100 MG ER capsule Take 1 capsule (100 mg total) by mouth 3 (three) times daily.  270 capsule  1   No facility-administered medications prior to visit.    PAST MEDICAL HISTORY: Past Medical History  Diagnosis Date  . Hyperlipidemia   . Hypertension   . Myocardial infarction 11/2002  . Stroke   . Seizure disorder   . Carotid stent occlusion bilateral 02/2011  . Carotid artery occlusion   . CAD (coronary artery disease)   . Peripheral arterial disease   . Cause of injury, fall 03/01/12  . Seizures     PAST SURGICAL HISTORY: Past Surgical History  Procedure Laterality Date  . Cholecystectomy      Gall Bladder  . Coronary artery bypass graft  11/2002    x3  . Ankle surgery  1998  . Carotid endarterectomy  12/1997    Bilateral   . Carotid endarterectomy  02/12/12    Left Carotid  . Carotid endarterectomy  02/26/12    Right Carotid  . Carotid stents Bilateral   . Carotid endarterectomies Bilateral 12/1997    FAMILY HISTORY: Family History  Problem Relation Age of Onset  . Stroke Father     SOCIAL HISTORY:  History   Social History  . Marital Status: Divorced    Spouse Name: N/A    Number of Children: 59  . Years of Education: HS   Occupational History  .     Social History Main Topics  . Smoking status: Former Smoker    Types: Cigarettes    Quit  date: 11/14/2007  . Smokeless tobacco: Never Used  . Alcohol Use: No  . Drug Use: No  . Sexual Activity: Not on file   Other Topics Concern  . Not on file   Social History Narrative   Patient lives at home with his caregiver/ex-wife.   Caffeine- 2 cups of coffee daily     PHYSICAL EXAM  Filed Vitals:   03/08/14 1358  BP: 177/85  Pulse: 66  Temp: 97.4 F (36.3 C)  TempSrc: Oral  Height: 5\' 7"  (1.702 m)  Weight: 183 lb (83.008 kg)    Not recorded    Body mass index is 28.66  kg/(m^2).  GENERAL EXAM: Patient is in no distress; MASKED FACIES  CARDIOVASCULAR: Regular rate and rhythm, no murmurs, no carotid bruits  NEUROLOGIC: MENTAL STATUS: awake, alert, SLIGHTLY DECR FLUENCY. Comprehension intact, naming intact CRANIAL NERVE: no papilledema on fundoscopic exam, pupils equal and reactive to light, visual fields full to confrontation, extraocular muscles intact, no nystagmus, facial sensation; DECR LEFT LOWER FACIAL STRENGTH. symmetric, uvula midline, shoulder shrug symmetric, tongue midline. MOTOR: INCR TONE IN LUE AND LLE. LUE 1-2/5; LLE 3/5 WITH FOOT DROP. RUE INT REST AND POSTURAL TREMOR. SIG TREMOR WITH RIGHT HAND ARCHIMEDES SPIRAL DRAWING. MOD BRADYKINESIA WITH RUE AND RLE.  SENSORY: DECR ON LEFT SIDE COORDINATION: finger-nose-finger, fine finger movements SLOW ON RIGHT. LIMITED ON LEFT DUE TO WEAKNESS. REFLEXES: BRISK IN LUE AND LLE. GAIT/STATION: LEFT HEMIPARIETIC GAIT   DIAGNOSTIC DATA (LABS, IMAGING, TESTING) - I reviewed patient records, labs, notes, testing and imaging myself where available.  Lab Results  Component Value Date   WBC 7.7 07/13/2013   HGB 14.3 07/13/2013   HCT 42.0 07/13/2013   MCV 97.9 07/13/2013   PLT 183 07/13/2013      Component Value Date/Time   NA 138 07/13/2013 0814   K 5.0 07/13/2013 0814   CL 103 07/13/2013 0814   CO2 25 07/13/2013 0814   GLUCOSE 107* 07/13/2013 0814   BUN 16 07/13/2013 0814   CREATININE 1.11 07/13/2013 0814    CREATININE 0.80 05/23/2011 0231   CALCIUM 9.2 07/13/2013 0814   PROT 6.9 07/13/2013 0814   ALBUMIN 4.3 07/13/2013 0814   AST 25 07/13/2013 0814   ALT 26 07/13/2013 0814   ALKPHOS 101 07/13/2013 0814   BILITOT 0.4 07/13/2013 0814   GFRNONAA >60 05/23/2011 0231   GFRAA >60 05/23/2011 0231   Lab Results  Component Value Date   CHOL 143 07/13/2013   HDL 32* 07/13/2013   LDLCALC 63 07/13/2013   TRIG 240* 07/13/2013   CHOLHDL 4.5 07/13/2013   Lab Results  Component Value Date   HGBA1C 6.5* 05/23/2011   Lab Results  Component Value Date   VITAMINB12 1948* 05/23/2011   Lab Results  Component Value Date   TSH 3.245 07/13/2013     ASSESSMENT AND PLAN  68 y.o. year old male  has a past medical history of Hyperlipidemia; Hypertension; Myocardial infarction (11/2002); Stroke; Seizure disorder; Carotid stent occlusion (bilateral 02/2011); Carotid artery occlusion; CAD (coronary artery disease); Peripheral arterial disease; Cause of injury, fall (03/01/12); and Seizures. here with history of right MCA stroke and seizure disorder. Patient is doing well on brand name Keppra and Dilantin.   PLAN: 1. Continue brand name dilantin and keppra 2. Consider carb/levo for parkinsonism symptoms affecting the right side; will monitor for next 4-6 months before starting as patient and wife are concerned about potential side effects / interactions 3. Consider PT evaluation for gait and balance training; now at higher risk due to combination of chronic right MCA stroke, now with right body hemiparkinsonism. They will resume home exercises for now.    Penni Bombard, MD 4/48/1856, 3:14 PM Certified in Neurology, Neurophysiology and Neuroimaging  [facility] [address] [place], [place] [address] [phone]

## 2014-03-22 ENCOUNTER — Encounter: Payer: Self-pay | Admitting: *Deleted

## 2014-03-23 ENCOUNTER — Encounter: Payer: Self-pay | Admitting: Cardiovascular Disease

## 2014-03-24 ENCOUNTER — Encounter: Payer: Self-pay | Admitting: Cardiovascular Disease

## 2014-03-24 ENCOUNTER — Ambulatory Visit (INDEPENDENT_AMBULATORY_CARE_PROVIDER_SITE_OTHER): Payer: Medicare Other | Admitting: Cardiovascular Disease

## 2014-03-24 VITALS — BP 150/72 | HR 59 | Ht 67.0 in | Wt 180.0 lb

## 2014-03-24 DIAGNOSIS — Z79899 Other long term (current) drug therapy: Secondary | ICD-10-CM

## 2014-03-24 DIAGNOSIS — I2510 Atherosclerotic heart disease of native coronary artery without angina pectoris: Secondary | ICD-10-CM

## 2014-03-24 DIAGNOSIS — I6529 Occlusion and stenosis of unspecified carotid artery: Secondary | ICD-10-CM

## 2014-03-24 DIAGNOSIS — E785 Hyperlipidemia, unspecified: Secondary | ICD-10-CM

## 2014-03-24 DIAGNOSIS — Z951 Presence of aortocoronary bypass graft: Secondary | ICD-10-CM | POA: Insufficient documentation

## 2014-03-24 NOTE — Assessment & Plan Note (Signed)
On statin therapy. Recent lipid profile performed 07/13/13 revealed a total cholesterol of 143, LDL of 63 and HDL of 32. I am going to recheck a lipid and liver profile

## 2014-03-24 NOTE — Patient Instructions (Signed)
Your physician wants you to follow-up in: 1 year with Dr Gwenlyn Found. You will receive a reminder letter in the mail two months in advance. If you don't receive a letter, please call our office to schedule the follow-up appointment.  Your physician recommends that you return for lab work: Fasting!

## 2014-03-24 NOTE — Assessment & Plan Note (Signed)
History of CAD status post coronary artery bypass grafting by Dr. Nani Ravens in 2004. He has never had a heart attack. His last Myoview performed 01/09/11 was nonischemic. He denies chest pain or shortness of breath.

## 2014-03-24 NOTE — Assessment & Plan Note (Signed)
Status post bilateral carotid endarterectomies in 1999 associated with a stroke at that time. He has left-sided hemiparesis hemiplegia. He walks with a cane. Because of restenosis he underwent staged right and left carotid stenting by myself and Dr. Trula Slade in May of 2012. Dr. Trula Slade follows his carotid Doppler studies.

## 2014-03-24 NOTE — Progress Notes (Signed)
03/24/2014 [name]   03/30/1946  [id number]  Primary Physician Lilian Coma, MD Primary Cardiologist: Lorretta Harp MD Renae Gloss   HPI: [name] is a 68 year old mildly overweight but was Caucasian male father of 4 children accompanied by his first ex-wife who is his caregiver. His primary care physician is Dr. [name]. He he is a  retired Administrator. This [place] positive for 75 pack years of tobacco smoking having quit in 2009. He is treated hyperlipidemia. He did have bilateral carotid endarterectomies back in 1999 a sister with a stroke that caused left hemiparesis and paresthesias. He had bilateral carotid stenting performed by Dr. Trula Slade and myself in a staged fashion because of restenosis back in 2012. He also clearly has Parkinson's disease. He had coronary bypass grafting in 2004 by Dr. Merilynn Finland and a Myoview stress test performed 01/09/11 was nonischemic. He denies chest pain or shortness of breath.   Current Outpatient Prescriptions  Medication Sig Dispense Refill  . AGGRENOX 25-200 MG per 12 hr capsule TAKE 1 CAPSULE BY MOUTH 2 (TWO) TIMES DAILY.  180 capsule  1  . Coenzyme Q10 (COQ10) 50 MG CAPS Take 100 mg by mouth daily.       . dantrolene (DANTRIUM) 50 MG capsule TAKE 1 CAPSULE (50 MG TOTAL) BY MOUTH 2 (TWO) TIMES DAILY.  180 capsule  1  . ezetimibe (ZETIA) 10 MG tablet Take 1 tablet (10 mg total) by mouth daily.  90 tablet  1  . Flaxseed, Linseed, 1000 MG CAPS Take 1 capsule by mouth daily.       . FOLBIC 2.5-25-2 MG TABS TAKE 1 TABLET BY MOUTH DAILY.  90 tablet  2  . KEPPRA 500 MG tablet Take 1 tablet (500 mg total) by mouth 2 (two) times daily.  180 tablet  4  . Magnesium 250 MG TABS Take 1 tablet by mouth daily.        . metoprolol tartrate (LOPRESSOR) 25 MG tablet Take 25 mg by mouth daily.        Marland Kitchen olmesartan (BENICAR) 20 MG tablet Take 1 tablet (20 mg total) by mouth daily.  90 tablet  1  . phenytoin (DILANTIN)  100 MG ER capsule Take 1 capsule (100 mg total) by mouth 3 (three) times daily.  270 capsule  4  . rosuvastatin (CRESTOR) 20 MG tablet Take 1.5 tablets (30 mg total) by mouth daily.  135 tablet  0  . vitamin C (ASCORBIC ACID) 500 MG tablet Take 500 mg by mouth daily.         No current facility-administered medications for this visit.    Allergies  Allergen Reactions  . Altace [Ramipril] Cough    History   Social History  . Marital Status: Divorced    Spouse Name: N/A    Number of Children: 46  . Years of Education: HS   Occupational History  .     Social History Main Topics  . Smoking status: Former Smoker    Types: Cigarettes    Quit date: 11/14/2007  . Smokeless tobacco: Never Used  . Alcohol Use: No  . Drug Use: No  . Sexual Activity: Not on file   Other Topics Concern  . Not on file   Social History Narrative   Patient lives at home with his caregiver/ex-wife.   Caffeine- 2 cups of coffee daily     Review of Systems: General: negative for chills, fever, night sweats or  weight changes.  Cardiovascular: negative for chest pain, dyspnea on exertion, edema, orthopnea, palpitations, paroxysmal nocturnal dyspnea or shortness of breath Dermatological: negative for rash Respiratory: negative for cough or wheezing Urologic: negative for hematuria Abdominal: negative for nausea, vomiting, diarrhea, bright red blood per rectum, melena, or hematemesis Neurologic: negative for visual changes, syncope, or dizziness All other systems reviewed and are otherwise negative except as noted above.    Blood pressure 150/72, pulse 59, height 5\' 7"  (1.702 m), weight 180 lb (81.647 kg).  General appearance: alert and no distress Neck: no adenopathy, no carotid bruit, no JVD, supple, symmetrical, trachea midline and thyroid not enlarged, symmetric, no tenderness/mass/nodules Lungs: clear to auscultation bilaterally Heart: regular rate and rhythm, S1, S2 normal, no murmur, click, rub  or gallop Extremities: extremities normal, atraumatic, no cyanosis or edema  EKG sinus bradycardia at 59 without ST or T wave changes  ASSESSMENT AND PLAN:   Hyperlipidemia On statin therapy. Recent lipid profile performed 07/13/13 revealed a total cholesterol of 143, LDL of 63 and HDL of 32. I am going to recheck a lipid and liver profile  Coronary artery disease History of CAD status post coronary artery bypass grafting by Dr. Nani Ravens in 2004. He has never had a heart attack. His last Myoview performed 01/09/11 was nonischemic. He denies chest pain or shortness of breath.  Occlusion and stenosis of carotid artery without mention of cerebral infarction Status post bilateral carotid endarterectomies in 1999 associated with a stroke at that time. He has left-sided hemiparesis hemiplegia. He walks with a cane. Because of restenosis he underwent staged right and left carotid stenting by myself and Dr. Trula Slade in May of 2012. Dr. Trula Slade follows his carotid Doppler studies.      Lorretta Harp MD FACP,FACC,FAHA, [facility] 03/24/2014 11:49 AM

## 2014-04-11 LAB — LIPID PANEL
Cholesterol: 149 mg/dL (ref 0–200)
HDL: 35 mg/dL — ABNORMAL LOW (ref >39)
LDL Cholesterol: 59 mg/dL (ref 0–99)
Total CHOL/HDL Ratio: 4.3 Ratio
Triglycerides: 273 mg/dL — ABNORMAL HIGH (ref <150)
VLDL: 55 mg/dL — ABNORMAL HIGH (ref 0–40)

## 2014-04-11 LAB — HEPATIC FUNCTION PANEL
ALT: 26 U/L (ref 0–53)
AST: 25 U/L (ref 0–37)
Albumin: 4.2 g/dL (ref 3.5–5.2)
Alkaline Phosphatase: 90 U/L (ref 39–117)
Bilirubin, Direct: 0.1 mg/dL (ref 0.0–0.3)
Indirect Bilirubin: 0.3 mg/dL (ref 0.2–1.2)
Total Bilirubin: 0.4 mg/dL (ref 0.2–1.2)
Total Protein: 7 g/dL (ref 6.0–8.3)

## 2014-04-13 ENCOUNTER — Encounter: Payer: Self-pay | Admitting: *Deleted

## 2014-04-13 ENCOUNTER — Telehealth: Payer: Self-pay | Admitting: Cardiovascular Disease

## 2014-04-13 ENCOUNTER — Telehealth: Payer: Self-pay | Admitting: *Deleted

## 2014-04-13 DIAGNOSIS — E782 Mixed hyperlipidemia: Secondary | ICD-10-CM

## 2014-04-13 NOTE — Telephone Encounter (Signed)
Lab results mailed.

## 2014-04-13 NOTE — Telephone Encounter (Signed)
Message copied by Chauncy Lean on Thu Apr 13, 2014  2:24 PM ------      Message from: Lorretta Harp      Created: Wed Apr 12, 2014  7:18 AM       LFTs OK. FLP good except for Trig. Review diet. If his diet needs improvement can advise and recheck, otherwise will need to start Tricor and recheck ------

## 2014-04-13 NOTE — Telephone Encounter (Signed)
She said she just talked to you,she would like for you to mail her a copy of his cholesterol results.

## 2014-04-13 NOTE — Telephone Encounter (Signed)
Referral made to [facility] for the lipid clinic. Patient has a low triglyceride diet and has tried Tricor in the past.

## 2014-05-12 ENCOUNTER — Other Ambulatory Visit: Payer: Self-pay | Admitting: Cardiovascular Disease

## 2014-05-12 ENCOUNTER — Other Ambulatory Visit: Payer: Self-pay | Admitting: Internal Medicine

## 2014-05-12 NOTE — Telephone Encounter (Signed)
Rx was sent to pharmacy electronically. 

## 2014-05-23 ENCOUNTER — Other Ambulatory Visit: Payer: Self-pay | Admitting: Diagnostic Neuroimaging

## 2014-05-29 ENCOUNTER — Telehealth: Payer: Self-pay | Admitting: Cardiovascular Disease

## 2014-05-29 DIAGNOSIS — E782 Mixed hyperlipidemia: Secondary | ICD-10-CM

## 2014-05-29 DIAGNOSIS — Z79899 Other long term (current) drug therapy: Secondary | ICD-10-CM

## 2014-05-29 NOTE — Telephone Encounter (Signed)
Pt's ex wife who is also his care giver wants to know why [name] why originally taken off of Tricor and if by chance he can be put back on it. This is something the pt is wanting. Please call  Thanks

## 2014-05-29 NOTE — Telephone Encounter (Signed)
Spoke with pt caregiver, the pt last took tricor 2011, they are not sure why it was stopped. The pt would like to try taking tricor again before going to the lipid clinic. Will forward for dr berry okay to start tricor.

## 2014-05-30 MED ORDER — FENOFIBRATE 48 MG PO TABS: 48.0000 mg | ORAL_TABLET | Freq: Every day | ORAL | Status: DC

## 2014-05-30 NOTE — Telephone Encounter (Signed)
Can try tricor and recheck  JJB

## 2014-05-30 NOTE — Telephone Encounter (Signed)
Dr Berry, please advise 

## 2014-05-30 NOTE — Telephone Encounter (Signed)
I spoke with patient's ex wife and RX sent in for tricor and lab slip mailed for recheck.

## 2014-05-30 NOTE — Addendum Note (Signed)
Addended byChauncy Lean. on: 05/30/2014 03:42 PM   Modules accepted: Orders

## 2014-06-08 ENCOUNTER — Telehealth: Payer: Self-pay | Admitting: *Deleted

## 2014-06-08 NOTE — Telephone Encounter (Signed)
We received a fax from [facility] asking for a PA to be done for [name]'s tricor.  He is currently taking Crestor and Zetia with a trig level of 273.  I filled out the Pa form and faxed it in.  FYI_ tricor was stopped March 2012 b/c the pharmacist told him that he shouldn't take crestor and tricor.

## 2014-06-09 NOTE — Telephone Encounter (Signed)
I received a fax from [facility] that PA is not needed for fenofibrate.  Patient needs to try generic first.  Info sent to pharmacy.

## 2014-06-27 ENCOUNTER — Other Ambulatory Visit: Payer: Self-pay | Admitting: Physical Medicine & Rehabilitation

## 2014-06-28 ENCOUNTER — Encounter: Payer: Medicare Other | Attending: Physical Medicine & Rehabilitation | Admitting: Physical Medicine & Rehabilitation

## 2014-06-28 ENCOUNTER — Encounter: Payer: Self-pay | Admitting: Physical Medicine & Rehabilitation

## 2014-06-28 VITALS — BP 178/83 | HR 65 | Resp 14 | Wt 180.1 lb

## 2014-06-28 DIAGNOSIS — G20 Parkinson's disease: Secondary | ICD-10-CM | POA: Insufficient documentation

## 2014-06-28 DIAGNOSIS — G8110 Spastic hemiplegia affecting unspecified side: Secondary | ICD-10-CM | POA: Insufficient documentation

## 2014-06-28 DIAGNOSIS — G20A1 Parkinson's disease without dyskinesia, without mention of fluctuations: Secondary | ICD-10-CM | POA: Insufficient documentation

## 2014-06-28 NOTE — Progress Notes (Signed)
Subjective:    Patient ID: [name], male    DOB: [birth year], 68 y.o.   MRN: [id number]  HPI  [name] is back regarding his chronic spastic left hemiparesis. Neurology has diagnosed him with Parkinsons earlier this year. He's on no medications at present. His wife hasn't noticed any major changes except for the fact that he's not doing as much. He fatigues fairly easily.   He hasn't had a fall since I last saw him. His wife attributes it to the new dx and being safer at home.       Pain Inventory Average Pain 0 Pain Right Now 0 My pain is no pain  In the last 24 hours, has pain interfered with the following? General activity 0 Relation with others 0 Enjoyment of life 0 What TIME of day is your pain at its worst? none Sleep (in general) Good  Pain is worse with: no pain Pain improves with: no pain Relief from Meds: 0  Mobility use a cane  Function retired  Neuro/Psych weakness trouble walking  Prior Studies Any changes since last visit?  no  Physicians involved in your care Any changes since last visit?  no   Family History  Problem Relation Age of Onset  . Stroke Father   . Heart attack Father   . Diabetes Brother   . Hyperlipidemia Brother   . Hypertension Brother   . Hyperlipidemia Sister   . Hypertension Sister    History   Social History  . Marital Status: Divorced    Spouse Name: N/A    Number of Children: 16  . Years of Education: HS   Occupational History  .     Social History Main Topics  . Smoking status: Former Smoker    Types: Cigarettes    Quit date: 11/14/2007  . Smokeless tobacco: Never Used  . Alcohol Use: No  . Drug Use: No  . Sexual Activity: None   Other Topics Concern  . None   Social History Narrative   Patient lives at home with his caregiver/ex-wife.   Caffeine- 2 cups of coffee daily   Past Surgical History  Procedure Laterality Date  . Cardiac catheterization  11/17/2002    significant left main  disease and 3 vessel CAD, mildly depressed LV systolic  fx, 54% left renal artery stenosis  . Coronary artery bypass graft  11/2002    x3 LIMA to LAD,free right internal mammary artery to obtuse marginal 1,SVG to distal right coronary.  . Ankle surgery  1998  . Carotid endarterectomy  12/1997    Bilateral   . Carotid endarterectomy  02/12/11    Left Carotid  . Carotid endarterectomy  02/26/11    Right Carotid  . Carotid stents Bilateral   . US echocardiography  01/03/2011    LA mildly dilated,trace MR,TR,AOV mildly sclerotic.  Marland Kitchen Nm myocar perf wall motion  01/09/2011    mild to mod. perfusion defect in the basal inferolateral & mid inferolaterl regions c/w infarct/scar. No significant ishcemia demonstrated.   Past Medical History  Diagnosis Date  . Hyperlipidemia   . Hypertension   . Myocardial infarction 11/2002  . Stroke   . Seizure disorder   . Carotid stent occlusion bilateral 02/2011  . CAD (coronary artery disease)   . Peripheral arterial disease   . Cause of injury, fall 03/01/12  . Pancreatic pseudocyst    BP 178/83  Pulse 65  Resp 14  Wt 180 lb 1.3 oz (81.684  kg)  SpO2 96%  Opioid Risk Score:   Fall Risk Score: Low Fall Risk (0-5 points)  Review of Systems     Objective:   Physical Exam   Constitutional: He is oriented to person, place, and time. He appears well-developed and well-nourished.  HENT:  Head: Normocephalic and atraumatic.  Right Ear: External ear normal.  Left Ear: External ear normal.  Nose: Nose normal.  Mouth/Throat: Oropharynx is clear and moist.  Eyes: Conjunctivae and EOM are normal. Pupils are equal, round, and reactive to light.  Neck: Normal range of motion. Neck supple.  Cardiovascular: Normal rate and regular rhythm.  Pulmonary/Chest: Effort normal.  Abdominal: Soft. Bowel sounds are normal.  Musculoskeletal: Normal range of motion.  Neurological: He is alert and oriented to person, place, and time. A cranial nerve deficit is present.    LLE is 1-3/5. LUE is 1-3/5. Spasticity is 1/4. Sensation 1/2 in both upper and lower. Pill rolling tremor right upper ext. Occasionally has difficulty initiating movement in the right leg. Slight masked facies.  No rigidity. Psychiatric: He has a normal mood and affect. His behavior is normal. Judgment and thought content normal.  He continues to have hyperextension at the left knee with weight bearing. His rotation of the left leg is better with less circumduction and ER. He appears generally stable. He is able to change directions with extra time. Not once did he appear to be off balance. He still tries to walk too fast. Gait is not shuffling or festinating. Cognitively he appears to be in her baseline.    Assessment & Plan:   ASSESSMENT:  1. Right cerebrovascular accident, spastic left hemiparesis.  2. Seizure disorder.  3. Gait disorder.    PLAN:  1. Reviewed PD sx and management. He is more sensible about his activities. i would like to see hiim get into the pool and do some walking. He should utilize his w/c for longer distances. We could consider outpt therapy in our PD program if experiences any further problems. At this point, I dont' feel it's necessary.  2. Continue with Dilantin and keppra per neuro recs.  3. Dantrium will be continued for spasticity management.  4. 12 months follow up. All questions were encouraged and answered. He will call me back prn.

## 2014-07-03 ENCOUNTER — Other Ambulatory Visit: Payer: Self-pay | Admitting: *Deleted

## 2014-07-03 ENCOUNTER — Other Ambulatory Visit: Payer: Self-pay

## 2014-07-03 MED ORDER — METOPROLOL TARTRATE 25 MG PO TABS
25.0000 mg | ORAL_TABLET | Freq: Every day | ORAL | Status: DC
Start: 2014-07-03 — End: 2015-09-10

## 2014-07-03 MED ORDER — METOPROLOL TARTRATE 25 MG PO TABS: 25.0000 mg | ORAL_TABLET | Freq: Every day | ORAL | Status: DC

## 2014-07-03 NOTE — Telephone Encounter (Signed)
Rx was sent to pharmacy electronically. 

## 2014-07-07 ENCOUNTER — Other Ambulatory Visit: Payer: Self-pay | Admitting: Cardiovascular Disease

## 2014-07-10 NOTE — Telephone Encounter (Signed)
Rx was sent to pharmacy electronically. 

## 2014-08-03 LAB — HEPATIC FUNCTION PANEL
ALT: 17 U/L (ref 0–53)
AST: 19 U/L (ref 0–37)
Albumin: 3.9 g/dL (ref 3.5–5.2)
Alkaline Phosphatase: 68 U/L (ref 39–117)
Bilirubin, Direct: 0.1 mg/dL (ref 0.0–0.3)
Indirect Bilirubin: 0.2 mg/dL (ref 0.2–1.2)
Total Bilirubin: 0.3 mg/dL (ref 0.2–1.2)
Total Protein: 6.5 g/dL (ref 6.0–8.3)

## 2014-08-03 LAB — LIPID PANEL
Cholesterol: 151 mg/dL (ref 0–200)
HDL: 31 mg/dL — ABNORMAL LOW (ref >39)
LDL Cholesterol: 74 mg/dL (ref 0–99)
Total CHOL/HDL Ratio: 4.9 Ratio
Triglycerides: 228 mg/dL — ABNORMAL HIGH (ref <150)
VLDL: 46 mg/dL — ABNORMAL HIGH (ref 0–40)

## 2014-08-08 ENCOUNTER — Encounter: Payer: Self-pay | Admitting: *Deleted

## 2014-08-09 ENCOUNTER — Telehealth: Payer: Self-pay | Admitting: Cardiovascular Disease

## 2014-08-09 NOTE — Telephone Encounter (Signed)
Provided Ranganathan,[name] (caregiver, ex-wife) lab results - with verbal OK from patient (she is his healthcare POA)

## 2014-08-09 NOTE — Telephone Encounter (Signed)
Pt would like his lab results from last week.

## 2014-08-14 ENCOUNTER — Other Ambulatory Visit: Payer: Self-pay | Admitting: *Deleted

## 2014-08-14 MED ORDER — DANTROLENE SODIUM 50 MG PO CAPS: 50.0000 mg | ORAL_CAPSULE | Freq: Two times a day (BID) | ORAL | Status: DC

## 2014-08-15 ENCOUNTER — Other Ambulatory Visit: Payer: Self-pay

## 2014-08-15 MED ORDER — FA-PYRIDOXINE-CYANOCOBALAMIN 2.5-25-2 MG PO TABS: ORAL_TABLET | ORAL | Status: DC

## 2014-08-15 MED ORDER — ASPIRIN-DIPYRIDAMOLE ER 25-200 MG PO CP12: ORAL_CAPSULE | ORAL | Status: DC

## 2014-09-13 ENCOUNTER — Ambulatory Visit (INDEPENDENT_AMBULATORY_CARE_PROVIDER_SITE_OTHER): Payer: Medicare Other | Admitting: Diagnostic Neuroimaging

## 2014-09-13 ENCOUNTER — Encounter: Payer: Self-pay | Admitting: Diagnostic Neuroimaging

## 2014-09-13 VITALS — BP 187/88 | HR 66 | Temp 97.5°F

## 2014-09-13 DIAGNOSIS — I6930 Unspecified sequelae of cerebral infarction: Secondary | ICD-10-CM

## 2014-09-13 DIAGNOSIS — G20 Parkinson's disease: Secondary | ICD-10-CM

## 2014-09-13 DIAGNOSIS — G40909 Epilepsy, unspecified, not intractable, without status epilepticus: Secondary | ICD-10-CM

## 2014-09-13 DIAGNOSIS — Z8673 Personal history of transient ischemic attack (TIA), and cerebral infarction without residual deficits: Secondary | ICD-10-CM

## 2014-09-13 MED ORDER — CARBIDOPA-LEVODOPA 25-100 MG PO TABS: 0.5000 | ORAL_TABLET | Freq: Three times a day (TID) | ORAL | Status: DC

## 2014-09-13 NOTE — Patient Instructions (Signed)
Try carbidopa-levodopa.

## 2014-09-13 NOTE — Progress Notes (Signed)
[facility]  PATIENT: [name] DOB: 10/21/1945  REFERRING CLINICIAN:  HISTORY FROM: patient and wife REASON FOR VISIT: follow up   HISTORICAL  CHIEF COMPLAINT:  Chief Complaint  Patient presents with  . Follow-up    HISTORY OF PRESENT ILLNESS:   UPDATE 09/13/14: Since last visit, symptoms are stable. Still falling. More problems due to right sided slowness and chronic left hemiparesis. Asking about parkinson's dz meds, diagnosis, treatment, supplements.   UPDATE 03/08/14: Since last visit, no seizures. Has been falling more. Has noticed hand writing deterioration over past 3 years. Also intermittent right hand tremor x 3-6 months (per wife). Patient noted some right hand tremor even in 1999.  UPDATE 03/08/13: Since last visit patient doing well. No seizures since 2005. Patient is stable on Dilantin and Keppra brand name medications. Patient's swallowing has improved since last visit. He make sure to take liquids following each mouthful of food.  PRIOR HPI (08/27/12, Dr. Erling Cruz): 68 year old right-handed white divorced male from [place], [place] who is cared for by his ex-first wife. He has a known history of hypertension, right MCA stroke 3/99 associated with residual left hemiparesis, acute right MCA stroke 09/2003,coronary artery disease status post 3 vessel bypass in 2004, bilateral carotid endarterectomies in 1999 and bilateral stent placement 5/12, recurrent TIAs versus seizures, documented seizure 11/12/2006, and Dilantin toxicity. His last MRI of the brain without contrast 11/13/2006 showed a large chronic right middle artery infarct involving the posterior temporal and parietal lobe and right thalamus. MR angiogram of the head without contrast showed intracranial atherosclerotic disease and occlusion of the right middle cerebral artery branch corresponding to the right MCA infarct. There was mild atherosclerotic vascular disease elsewhere and no  aneurysm. Doppler study of the carotids 3/5/009 was negative for hemodynamically significant stenosis in the intracranial arteries.  EEG 11/13/2006 showed rhythmic discharges correlating with the EKG and were thought to be artifact. . His last seizure was 11/18/2006. He subsequently had one episode of confusion 11/26/2009 which might have been a seizure. He was at his son's house, came home, looked in the  driveway, and asked where his car was. His car had been given to someone else in the family. He kept asking about the car and recognized that he was confused. The episode lasted 40 minutes. The patient lives with his caretaker. He is able to bathe himself and  take care of his toileting needs, but requires assistance in dressing. He  exercises at the [facility] 2 days per week. He has falls with a fall assessment tool score of 10. He denies macropsia, micropsia, strange odors or tastes. His caretaker reports that at times he has left knee hyperextension. He uses a left leg brace. He denies TIA symptoms or warnings of seizures.He has finished 2 months of PT in no rehabilitation and has a new left foot brace, which is not associated with hyperextension of his knee. He denies spasticity or spasms. He enjoys playing "We" 2 times per day for exercise.His wife reports that he chokes on his saliva. He has not had  unexplained fever or chills. Last trough levetiracetam level 15.9  and phenytoin level 13.4 on 03/11/2012.  REVIEW OF SYSTEMS: Full 14 system review of systems performed and notable only for fatigue confusion dizziness anxiety irritability.   ALLERGIES: Allergies  Allergen Reactions  . Altace [Ramipril] Cough    HOME MEDICATIONS: Outpatient Prescriptions Prior to Visit  Medication Sig Dispense Refill  . BENICAR 20 MG tablet TAKE  1 TABLET BY MOUTH DAILY 90 tablet 3  . Coenzyme Q10 (COQ10) 50 MG CAPS Take 100 mg by mouth daily.     . dantrolene (DANTRIUM) 50 MG capsule Take 1 capsule (50 mg total) by mouth 2  (two) times daily. 180 capsule 0  . dipyridamole-aspirin (AGGRENOX) 200-25 MG per 12 hr capsule TAKE 1 CAPSULE BY MOUTH 2 (TWO) TIMES DAILY. 180 capsule 2  . fenofibrate (TRICOR) 48 MG tablet Take 1 tablet (48 mg total) by mouth daily. 90 tablet 3  . Flaxseed, Linseed, 1000 MG CAPS Take 1 capsule by mouth daily.     . folic acid-pyridoxine-cyancobalamin (FOLBIC) 2.5-25-2 MG TABS TAKE 1 TABLET BY MOUTH DAILY. 90 tablet 2  . KEPPRA 500 MG tablet TAKE 1 TABLET BY MOUTH TWICE DAILY 180 tablet 3  . Magnesium 250 MG TABS Take 1 tablet by mouth daily.      . metoprolol tartrate (LOPRESSOR) 25 MG tablet Take 1 tablet (25 mg total) by mouth daily. 90 tablet 2  . phenytoin (DILANTIN) 100 MG ER capsule Take 1 capsule (100 mg total) by mouth 3 (three) times daily. 270 capsule 4  . rosuvastatin (CRESTOR) 20 MG tablet Take 1.5 tablets (30mg ) by mouth daily. 135 tablet 2  . vitamin C (ASCORBIC ACID) 500 MG tablet Take 500 mg by mouth daily.      Marland Kitchen ZETIA 10 MG tablet TAKE 1 TABLET BY MOUTH DAILY 90 tablet 2  . KEPPRA 500 MG tablet Take 1 tablet (500 mg total) by mouth 2 (two) times daily. 180 tablet 4   No facility-administered medications prior to visit.    PAST MEDICAL HISTORY: Past Medical History  Diagnosis Date  . Hyperlipidemia   . Hypertension   . Myocardial infarction 11/2002  . Stroke   . Seizure disorder   . Carotid stent occlusion bilateral 02/2011  . CAD (coronary artery disease)   . Peripheral arterial disease   . Cause of injury, fall 03/01/12  . Pancreatic pseudocyst     PAST SURGICAL HISTORY: Past Surgical History  Procedure Laterality Date  . Cardiac catheterization  11/17/2002    significant left main disease and 3 vessel CAD, mildly depressed LV systolic  fx, 19% left renal artery stenosis  . Coronary artery bypass graft  11/2002    x3 LIMA to LAD,free right internal mammary artery to obtuse marginal 1,SVG to distal right coronary.  . Ankle surgery  1998  . Carotid endarterectomy   12/1997    Bilateral   . Carotid endarterectomy  02/12/11    Left Carotid  . Carotid endarterectomy  02/26/11    Right Carotid  . Carotid stents Bilateral   . US echocardiography  01/03/2011    LA mildly dilated,trace MR,TR,AOV mildly sclerotic.  Marland Kitchen Nm myocar perf wall motion  01/09/2011    mild to mod. perfusion defect in the basal inferolateral & mid inferolaterl regions c/w infarct/scar. No significant ishcemia demonstrated.    FAMILY HISTORY: Family History  Problem Relation Age of Onset  . Stroke Father   . Heart attack Father   . Diabetes Brother   . Hyperlipidemia Brother   . Hypertension Brother   . Hyperlipidemia Sister   . Hypertension Sister     SOCIAL HISTORY:  History   Social History  . Marital Status: Divorced    Spouse Name: N/A    Number of Children: 5  . Years of Education: HS   Occupational History  .     Social History Main  Topics  . Smoking status: Former Smoker    Types: Cigarettes    Quit date: 11/14/2007  . Smokeless tobacco: Never Used  . Alcohol Use: No  . Drug Use: No  . Sexual Activity: Not on file   Other Topics Concern  . Not on file   Social History Narrative   Patient lives at home with his caregiver/ex-wife.   Caffeine- 2 cups of coffee daily     PHYSICAL EXAM  Filed Vitals:   09/13/14 1520  BP: 187/88  Pulse: 66  Temp: 97.5 F (36.4 C)  TempSrc: Oral    Not recorded      Cannot calculate BMI with a height equal to zero.  GENERAL EXAM: Patient is in no distress; MASKED FACIES  CARDIOVASCULAR: Regular rate and rhythm, no murmurs, no carotid bruits  NEUROLOGIC: MENTAL STATUS: awake, alert, SLIGHTLY DECR FLUENCY. Comprehension intact, naming intact CRANIAL NERVE: no papilledema on fundoscopic exam, pupils equal and reactive to light, visual fields full to confrontation, extraocular muscles intact, no nystagmus, facial sensation symmetric; DECR LEFT LOWER FACIAL STRENGTH. MILD DYSARTHRIA uvula midline, shoulder  shrug symmetric, tongue midline. MOTOR: INCR TONE IN LUE AND LLE. LUE 1-2/5; LLE 3/5 WITH FOOT DROP. RUE INT POSTURAL TREMOR. SIG TREMOR WITH RIGHT HAND ARCHIMEDES SPIRAL DRAWING. MOD BRADYKINESIA WITH RUE AND RLE.  SENSORY: DECR ON LEFT SIDE COORDINATION: finger-nose-finger, fine finger movements SLOW ON RIGHT. LIMITED ON LEFT DUE TO WEAKNESS. REFLEXES: BRISK IN LUE AND LLE. GAIT/STATION: LEFT HEMIPARIETIC GAIT; UNSTEADY WITH CLUMSINESS ON RIGHT SIDE.   DIAGNOSTIC DATA (LABS, IMAGING, TESTING) - I reviewed patient records, labs, notes, testing and imaging myself where available.  Lab Results  Component Value Date   WBC 7.7 07/13/2013   HGB 14.3 07/13/2013   HCT 42.0 07/13/2013   MCV 97.9 07/13/2013   PLT 183 07/13/2013      Component Value Date/Time   NA 138 07/13/2013 0814   K 5.0 07/13/2013 0814   CL 103 07/13/2013 0814   CO2 25 07/13/2013 0814   GLUCOSE 107* 07/13/2013 0814   BUN 16 07/13/2013 0814   CREATININE 1.11 07/13/2013 0814   CREATININE 0.80 05/23/2011 0231   CALCIUM 9.2 07/13/2013 0814   PROT 6.5 08/03/2014 0836   ALBUMIN 3.9 08/03/2014 0836   AST 19 08/03/2014 0836   ALT 17 08/03/2014 0836   ALKPHOS 68 08/03/2014 0836   BILITOT 0.3 08/03/2014 0836   GFRNONAA >60 05/23/2011 0231   GFRAA >60 05/23/2011 0231   Lab Results  Component Value Date   CHOL 151 08/03/2014   HDL 31* 08/03/2014   LDLCALC 74 08/03/2014   TRIG 228* 08/03/2014   CHOLHDL 4.9 08/03/2014   Lab Results  Component Value Date   HGBA1C 6.5* 05/23/2011   Lab Results  Component Value Date   VITAMINB12 1948* 05/23/2011   Lab Results  Component Value Date   TSH 3.245 07/13/2013     ASSESSMENT AND PLAN  68 y.o. year old male  has a past medical history of Hyperlipidemia; Hypertension; Myocardial infarction (11/2002); Stroke; Seizure disorder; Carotid stent occlusion (bilateral 02/2011); CAD (coronary artery disease); Peripheral arterial disease; Cause of injury, fall (03/01/12); and  Pancreatic pseudocyst. here with history of right MCA stroke and seizure disorder. Patient is doing well on brand name Keppra and Dilantin. Now with right body hemiparkinsonism since 2013.   PLAN: 1. Continue brand name dilantin and keppra for seizure control 2. Start carb/levo for parkinsonism symptoms affecting the right side 3. High fall risk  due to combination of chronic right MCA stroke and now with right body hemiparkinsonism; use wheelchair and walk with assistance 4. Check MRI brain to rule out new left brain event/insult  Meds ordered this encounter  Medications  . carbidopa-levodopa (SINEMET IR) 25-100 MG per tablet    Sig: Take 0.5-1 tablets by mouth 3 (three) times daily.    Dispense:  90 tablet    Refill:  12   Orders Placed This Encounter  Procedures  . MR Brain Wo Contrast   Return in about 4 months (around 01/13/2015).     Penni Bombard, MD 84/10/2818, 8:13 PM Certified in Neurology, Neurophysiology and Neuroimaging  [facility] [address], [place], [place] [address] [phone]

## 2014-09-27 ENCOUNTER — Other Ambulatory Visit: Payer: Self-pay | Admitting: Physical Medicine & Rehabilitation

## 2014-10-19 ENCOUNTER — Telehealth: Payer: Self-pay | Admitting: Diagnostic Neuroimaging

## 2014-10-19 ENCOUNTER — Ambulatory Visit
Admission: RE | Admit: 2014-10-19 | Discharge: 2014-10-19 | Disposition: A | Discharge status: Home / Self Care | Payer: Medicare Other | Source: Ambulatory Visit | Attending: Diagnostic Neuroimaging | Admitting: Diagnostic Neuroimaging

## 2014-10-19 DIAGNOSIS — I6930 Unspecified sequelae of cerebral infarction: Secondary | ICD-10-CM

## 2014-10-19 DIAGNOSIS — Z8673 Personal history of transient ischemic attack (TIA), and cerebral infarction without residual deficits: Secondary | ICD-10-CM

## 2014-10-19 DIAGNOSIS — G20 Parkinson's disease: Secondary | ICD-10-CM

## 2014-10-19 DIAGNOSIS — G40909 Epilepsy, unspecified, not intractable, without status epilepticus: Secondary | ICD-10-CM

## 2014-10-19 NOTE — Telephone Encounter (Signed)
Patient's spouse stated will be out of town from 10/21/14 - 10/31/14, if calling with MRI results during that time please call cell # [phone].

## 2014-10-25 NOTE — Telephone Encounter (Signed)
Spoke to spouse. Gave MRI results and future f/u appt details.

## 2014-11-04 ENCOUNTER — Other Ambulatory Visit: Payer: Self-pay | Admitting: Cardiovascular Disease

## 2014-11-05 NOTE — Telephone Encounter (Signed)
Rx(s) sent to pharmacy electronically.  

## 2014-11-20 ENCOUNTER — Other Ambulatory Visit (HOSPITAL_COMMUNITY): Payer: Medicare Other

## 2014-11-20 ENCOUNTER — Ambulatory Visit: Payer: Medicare Other | Admitting: Family

## 2014-12-06 ENCOUNTER — Ambulatory Visit: Payer: Medicare Other | Admitting: Family

## 2014-12-06 ENCOUNTER — Other Ambulatory Visit (HOSPITAL_COMMUNITY): Payer: Medicare Other

## 2014-12-18 ENCOUNTER — Encounter: Payer: Self-pay | Admitting: Diagnostic Neuroimaging

## 2014-12-18 ENCOUNTER — Other Ambulatory Visit: Payer: Self-pay | Admitting: Cardiovascular Disease

## 2014-12-18 ENCOUNTER — Ambulatory Visit (INDEPENDENT_AMBULATORY_CARE_PROVIDER_SITE_OTHER): Payer: Medicare Other | Admitting: Diagnostic Neuroimaging

## 2014-12-18 VITALS — BP 158/82 | HR 65 | Ht 67.0 in | Wt 180.4 lb

## 2014-12-18 DIAGNOSIS — G20 Parkinson's disease: Secondary | ICD-10-CM | POA: Diagnosis not present

## 2014-12-18 DIAGNOSIS — G40909 Epilepsy, unspecified, not intractable, without status epilepticus: Secondary | ICD-10-CM | POA: Diagnosis not present

## 2014-12-18 DIAGNOSIS — Z8673 Personal history of transient ischemic attack (TIA), and cerebral infarction without residual deficits: Secondary | ICD-10-CM

## 2014-12-18 DIAGNOSIS — I6930 Unspecified sequelae of cerebral infarction: Secondary | ICD-10-CM

## 2014-12-18 NOTE — Telephone Encounter (Signed)
Rx(s) sent to pharmacy electronically.  

## 2014-12-18 NOTE — Progress Notes (Signed)
[facility]  PATIENT: [name] DOB: 01/17/1946  REFERRING CLINICIAN:  HISTORY FROM: patient and wife REASON FOR VISIT: follow up   HISTORICAL  CHIEF COMPLAINT:  Chief Complaint  Patient presents with  . Follow-up    right stroke, Parkinson's Disease with improved gait per wife and no recent falls    HISTORY OF PRESENT ILLNESS:   UPDATE 12/18/14: Since last visit, stable. On carb/levo half tab TID. May have had some improvement in tremor. Still using single point cane. No falls since last visit.   UPDATE 09/13/14: Since last visit, symptoms are stable. Still falling. More problems due to right sided slowness and chronic left hemiparesis. Asking about parkinson's dz meds, diagnosis, treatment, supplements.   UPDATE 03/08/14: Since last visit, no seizures. Has been falling more. Has noticed hand writing deterioration over past 3 years. Also intermittent right hand tremor x 3-6 months (per wife). Patient noted some right hand tremor even in 1999.  UPDATE 03/08/13: Since last visit patient doing well. No seizures since 2005. Patient is stable on Dilantin and Keppra brand name medications. Patient's swallowing has improved since last visit. He make sure to take liquids following each mouthful of food.  PRIOR HPI (08/27/12, Dr. Erling Cruz): 69 year old right-handed white divorced male from [place], [place] who is cared for by his ex-first wife. He has a known history of hypertension, right MCA stroke 3/99 associated with residual left hemiparesis, acute right MCA stroke 09/2003,coronary artery disease status post 3 vessel bypass in 2004, bilateral carotid endarterectomies in 1999 and bilateral stent placement 5/12, recurrent TIAs versus seizures, documented seizure 11/12/2006, and Dilantin toxicity. His last MRI of the brain without contrast 11/13/2006 showed a large chronic right middle artery infarct involving the posterior temporal and parietal lobe and right  thalamus. MR angiogram of the head without contrast showed intracranial atherosclerotic disease and occlusion of the right middle cerebral artery branch corresponding to the right MCA infarct. There was mild atherosclerotic vascular disease elsewhere and no aneurysm. Doppler study of the carotids 3/5/009 was negative for hemodynamically significant stenosis in the intracranial arteries.  EEG 11/13/2006 showed rhythmic discharges correlating with the EKG and were thought to be artifact. . His last seizure was 11/18/2006. He subsequently had one episode of confusion 11/26/2009 which might have been a seizure. He was at his son's house, came home, looked in the  driveway, and asked where his car was. His car had been given to someone else in the family. He kept asking about the car and recognized that he was confused. The episode lasted 40 minutes. The patient lives with his caretaker. He is able to bathe himself and  take care of his toileting needs, but requires assistance in dressing. He  exercises at the [facility] 2 days per week. He has falls with a fall assessment tool score of 10. He denies macropsia, micropsia, strange odors or tastes. His caretaker reports that at times he has left knee hyperextension. He uses a left leg brace. He denies TIA symptoms or warnings of seizures.He has finished 2 months of PT in no rehabilitation and has a new left foot brace, which is not associated with hyperextension of his knee. He denies spasticity or spasms. He enjoys playing "We" 2 times per day for exercise.His wife reports that he chokes on his saliva. He has not had  unexplained fever or chills. Last trough levetiracetam level 15.9  and phenytoin level 13.4 on 03/11/2012.  REVIEW OF SYSTEMS: Full 14 system review  of systems performed and notable only for tremors.   ALLERGIES: Allergies  Allergen Reactions  . Altace [Ramipril] Cough    HOME MEDICATIONS: Outpatient Prescriptions Prior to Visit  Medication Sig Dispense  Refill  . BENICAR 20 MG tablet TAKE 1 TABLET BY MOUTH DAILY 90 tablet 3  . carbidopa-levodopa (SINEMET IR) 25-100 MG per tablet Take 0.5-1 tablets by mouth 3 (three) times daily. 90 tablet 12  . Coenzyme Q10 (COQ10) 50 MG CAPS Take 100 mg by mouth daily.     . dantrolene (DANTRIUM) 50 MG capsule TAKE 1 CAPSULE BY MOUTH TWICE DAILY 180 capsule 0  . dipyridamole-aspirin (AGGRENOX) 200-25 MG per 12 hr capsule TAKE 1 CAPSULE BY MOUTH 2 (TWO) TIMES DAILY. 180 capsule 2  . ezetimibe (ZETIA) 10 MG tablet Take 1 tablet (10 mg total) by mouth daily. 90 tablet 0  . fenofibrate (TRICOR) 48 MG tablet Take 1 tablet (48 mg total) by mouth daily. 90 tablet 3  . Flaxseed, Linseed, 1000 MG CAPS Take 1 capsule by mouth daily.     . folic acid-pyridoxine-cyancobalamin (FOLBIC) 2.5-25-2 MG TABS TAKE 1 TABLET BY MOUTH DAILY. 90 tablet 2  . KEPPRA 500 MG tablet TAKE 1 TABLET BY MOUTH TWICE DAILY 180 tablet 3  . Magnesium 250 MG TABS Take 1 tablet by mouth daily.      . metoprolol tartrate (LOPRESSOR) 25 MG tablet Take 1 tablet (25 mg total) by mouth daily. 90 tablet 2  . phenytoin (DILANTIN) 100 MG ER capsule Take 1 capsule (100 mg total) by mouth 3 (three) times daily. 270 capsule 4  . rosuvastatin (CRESTOR) 20 MG tablet Take 1.5 tablets (30 mg total) by mouth daily. 135 tablet 1  . vitamin C (ASCORBIC ACID) 500 MG tablet Take 500 mg by mouth daily.       No facility-administered medications prior to visit.    PAST MEDICAL HISTORY: Past Medical History  Diagnosis Date  . Hyperlipidemia   . Hypertension   . Myocardial infarction 11/2002  . Stroke   . Seizure disorder   . Carotid stent occlusion bilateral 02/2011  . CAD (coronary artery disease)   . Peripheral arterial disease   . Cause of injury, fall 03/01/12  . Pancreatic pseudocyst     PAST SURGICAL HISTORY: Past Surgical History  Procedure Laterality Date  . Cardiac catheterization  11/17/2002    significant left main disease and 3 vessel CAD, mildly  depressed LV systolic  fx, 93% left renal artery stenosis  . Coronary artery bypass graft  11/2002    x3 LIMA to LAD,free right internal mammary artery to obtuse marginal 1,SVG to distal right coronary.  . Ankle surgery  1998  . Carotid endarterectomy  12/1997    Bilateral   . Carotid endarterectomy  02/12/11    Left Carotid  . Carotid endarterectomy  02/26/11    Right Carotid  . Carotid stents Bilateral   . US echocardiography  01/03/2011    LA mildly dilated,trace MR,TR,AOV mildly sclerotic.  Marland Kitchen Nm myocar perf wall motion  01/09/2011    mild to mod. perfusion defect in the basal inferolateral & mid inferolaterl regions c/w infarct/scar. No significant ishcemia demonstrated.    FAMILY HISTORY: Family History  Problem Relation Age of Onset  . Stroke Father   . Heart attack Father   . Diabetes Brother   . Hyperlipidemia Brother   . Hypertension Brother   . Hyperlipidemia Sister   . Hypertension Sister     SOCIAL HISTORY:  History   Social History  . Marital Status: Divorced    Spouse Name: N/A  . Number of Children: 4  . Years of Education: HS   Occupational History  .     Social History Main Topics  . Smoking status: Former Smoker    Types: Cigarettes    Quit date: 11/14/2007  . Smokeless tobacco: Never Used  . Alcohol Use: No  . Drug Use: No  . Sexual Activity: Not on file   Other Topics Concern  . Not on file   Social History Narrative   Patient lives at home with his caregiver/ex-wife.   Caffeine- 2 cups of coffee daily     PHYSICAL EXAM  Filed Vitals:   12/18/14 1357  BP: 158/82  Pulse: 65  Height: 5\' 7"  (1.702 m)  Weight: 180 lb 6.4 oz (81.829 kg)    Not recorded      Body mass index is 28.25 kg/(m^2).  GENERAL EXAM: Patient is in no distress; MASKED FACIES  CARDIOVASCULAR: Regular rate and rhythm, no murmurs, no carotid bruits  NEUROLOGIC: MENTAL STATUS: awake, alert, SLIGHTLY DECR FLUENCY. Comprehension intact, naming intact CRANIAL  NERVE: pupils equal and reactive to light, visual fields full to confrontation, extraocular muscles intact, no nystagmus, facial sensation symmetric; DECR LEFT LOWER FACIAL STRENGTH. MILD DYSARTHRIA uvula midline, shoulder shrug symmetric, tongue midline. MOTOR: INCR TONE IN LUE AND LLE. LUE 1-2/5; LLE 3/5 WITH FOOT DROP. RUE INT POSTURAL TREMOR. MILD TREMOR WITH RIGHT HAND ARCHIMEDES SPIRAL DRAWING. MOD BRADYKINESIA WITH RUE AND RLE.  SENSORY: DECR ON LEFT SIDE COORDINATION: finger-nose-finger, fine finger movements SLOW ON RIGHT. LIMITED ON LEFT DUE TO WEAKNESS. REFLEXES: BRISK IN LUE AND LLE. GAIT/STATION: LEFT HEMIPARIETIC GAIT; UNSTEADY WITH CLUMSINESS ON RIGHT SIDE. USES SINGLE POINT CANE.   DIAGNOSTIC DATA (LABS, IMAGING, TESTING) - I reviewed patient records, labs, notes, testing and imaging myself where available.  Lab Results  Component Value Date   WBC 7.7 07/13/2013   HGB 14.3 07/13/2013   HCT 42.0 07/13/2013   MCV 97.9 07/13/2013   PLT 183 07/13/2013      Component Value Date/Time   NA 138 07/13/2013 0814   K 5.0 07/13/2013 0814   CL 103 07/13/2013 0814   CO2 25 07/13/2013 0814   GLUCOSE 107* 07/13/2013 0814   BUN 16 07/13/2013 0814   CREATININE 1.11 07/13/2013 0814   CREATININE 0.80 05/23/2011 0231   CALCIUM 9.2 07/13/2013 0814   PROT 6.5 08/03/2014 0836   ALBUMIN 3.9 08/03/2014 0836   AST 19 08/03/2014 0836   ALT 17 08/03/2014 0836   ALKPHOS 68 08/03/2014 0836   BILITOT 0.3 08/03/2014 0836   GFRNONAA >60 05/23/2011 0231   GFRAA >60 05/23/2011 0231   Lab Results  Component Value Date   CHOL 151 08/03/2014   HDL 31* 08/03/2014   LDLCALC 74 08/03/2014   TRIG 228* 08/03/2014   CHOLHDL 4.9 08/03/2014   Lab Results  Component Value Date   HGBA1C 6.5* 05/23/2011   Lab Results  Component Value Date   VITAMINB12 1948* 05/23/2011   Lab Results  Component Value Date   TSH 3.245 07/13/2013   I reviewed images myself and agree with interpretation.  -[facility]  10/19/14 MRI BRAIN 1. Chronic right hemispheric ischemic infarction. 2. Mild chronic small vessel ischemic disease. 3. No acute findings. No change from MRI on 11/13/06.    ASSESSMENT AND PLAN  69 y.o. year old male  has a past medical history of Hyperlipidemia; Hypertension; Myocardial  infarction (11/2002); Stroke; Seizure disorder; Carotid stent occlusion (bilateral 02/2011); CAD (coronary artery disease); Peripheral arterial disease; Cause of injury, fall (03/01/12); and Pancreatic pseudocyst. here with history of right MCA stroke and seizure disorder. Patient is doing well on brand name Keppra and Dilantin. Now with right body hemiparkinsonism since 2013.   PLAN: 1. Continue brand name dilantin and keppra for seizure control 2. Increase carb/levo to 1 tab TID parkinsonism symptoms affecting the right side 3. High fall risk due to combination of chronic right MCA stroke and now with right body hemiparkinsonism; use wheelchair and walk with assistance; consider rollator walker with seat, but will be diff due to left arm spasticity  Return in about 4 months (around 04/19/2015).     Penni Bombard, MD 10/21/8675, 3:73 PM Certified in Neurology, Neurophysiology and Neuroimaging  [facility] [address], [place], [place] [address] [phone]

## 2015-01-30 ENCOUNTER — Other Ambulatory Visit: Payer: Self-pay | Admitting: Cardiovascular Disease

## 2015-02-21 ENCOUNTER — Other Ambulatory Visit: Payer: Self-pay | Admitting: Cardiovascular Disease

## 2015-02-21 NOTE — Telephone Encounter (Signed)
Rx(s) sent to pharmacy electronically.  

## 2015-03-15 ENCOUNTER — Other Ambulatory Visit: Payer: Self-pay | Admitting: Cardiovascular Disease

## 2015-03-15 ENCOUNTER — Other Ambulatory Visit: Payer: Self-pay | Admitting: Diagnostic Neuroimaging

## 2015-03-15 NOTE — Telephone Encounter (Signed)
Rx has been sent to the pharmacy electronically. ° °

## 2015-03-15 NOTE — Telephone Encounter (Signed)
Former [facility] patient-He filled this Rx for many years

## 2015-03-17 ENCOUNTER — Other Ambulatory Visit: Payer: Self-pay | Admitting: Diagnostic Neuroimaging

## 2015-04-03 ENCOUNTER — Ambulatory Visit (INDEPENDENT_AMBULATORY_CARE_PROVIDER_SITE_OTHER): Payer: Medicare Other | Admitting: Cardiovascular Disease

## 2015-04-03 ENCOUNTER — Encounter: Payer: Self-pay | Admitting: Cardiovascular Disease

## 2015-04-03 VITALS — BP 152/80 | HR 64 | Ht 67.0 in | Wt 179.0 lb

## 2015-04-03 DIAGNOSIS — Z79899 Other long term (current) drug therapy: Secondary | ICD-10-CM

## 2015-04-03 DIAGNOSIS — I2510 Atherosclerotic heart disease of native coronary artery without angina pectoris: Secondary | ICD-10-CM | POA: Diagnosis not present

## 2015-04-03 DIAGNOSIS — E785 Hyperlipidemia, unspecified: Secondary | ICD-10-CM | POA: Diagnosis not present

## 2015-04-03 MED ORDER — NITROGLYCERIN 0.4 MG SL SUBL: 0.4000 mg | SUBLINGUAL_TABLET | SUBLINGUAL | Status: DC | PRN

## 2015-04-03 NOTE — Patient Instructions (Signed)
Your physician wants you to follow-up in: 1 year with Dr Berry. You will receive a reminder letter in the mail two months in advance. If you don't receive a letter, please call our office to schedule the follow-up appointment.  

## 2015-04-03 NOTE — Progress Notes (Signed)
04/03/2015 [name]   September 25, 1946  [id number]  Primary Physician Lilian Coma, MD Primary Cardiologist: Lorretta Harp MD Renae Gloss   HPI:  [name] is a 69 year old mildly overweight but was Caucasian male father of 4 children accompanied by his first ex-wife who is his caregiver. I last saw him in the office 03/24/14. His primary care physician is Dr. [name]. He he is a retired Administrator. His cardiac risk factor profile was  positive for 75 pack years of tobacco smoking having quit in 2009. He is treated hyperlipidemia. He did have bilateral carotid endarterectomies back in 1999 a sister with a stroke that caused left hemiparesis and paresthesias. He had bilateral carotid stenting performed by Dr. Trula Slade and myself in a staged fashion because of restenosis back in 2012. He also clearly has Parkinson's disease which has been progressive over the last year now requiring him to ambulate with the aid of a walker.. He had coronary bypass grafting in 2004 by Dr. Merilynn Finland and a Myoview stress test performed 01/09/11 was nonischemic. He denies chest pain or shortness of breath.   Current Outpatient Prescriptions  Medication Sig Dispense Refill  . AGGRENOX 25-200 MG per 12 hr capsule TAKE 1 CAPSULE BY MOUTH 2 (TWO) TIMES DAILY. 180 capsule 4  . BENICAR 20 MG tablet TAKE 1 TABLET BY MOUTH DAILY 90 tablet 0  . carbidopa-levodopa (SINEMET IR) 25-100 MG per tablet Take 0.5-1 tablets by mouth 3 (three) times daily. 90 tablet 12  . Coenzyme Q10 (COQ10) 50 MG CAPS Take 100 mg by mouth daily.     . dantrolene (DANTRIUM) 50 MG capsule TAKE 1 CAPSULE BY MOUTH TWICE DAILY 180 capsule 0  . fenofibrate (TRICOR) 48 MG tablet TAKE 1 TABLET BY MOUTH DAILY 90 tablet 0  . Flaxseed, Linseed, 1000 MG CAPS Take 1 capsule by mouth daily.     . FOLBIC 2.5-25-2 MG TABS TAKE 1 TABLET BY MOUTH DAILY. 90 tablet 4  . KEPPRA 500 MG tablet TAKE 1 TABLET BY MOUTH TWICE DAILY 180  tablet 3  . Magnesium 250 MG TABS Take 1 tablet by mouth daily.      . metoprolol tartrate (LOPRESSOR) 25 MG tablet Take 1 tablet (25 mg total) by mouth daily. 90 tablet 2  . phenytoin (DILANTIN) 100 MG ER capsule Take 1 capsule (100 mg total) by mouth 3 (three) times daily. 270 capsule 4  . rosuvastatin (CRESTOR) 20 MG tablet Take 1.5 tablets (30 mg total) by mouth daily. 135 tablet 1  . vitamin C (ASCORBIC ACID) 500 MG tablet Take 500 mg by mouth daily.      Marland Kitchen ZETIA 10 MG tablet TAKE 1 TABLET BY MOUTH DAILY 30 tablet 0   No current facility-administered medications for this visit.    Allergies  Allergen Reactions  . Altace [Ramipril] Cough    History   Social History  . Marital Status: Divorced    Spouse Name: N/A  . Number of Children: 4  . Years of Education: HS   Occupational History  .     Social History Main Topics  . Smoking status: Former Smoker    Types: Cigarettes    Quit date: 11/14/2007  . Smokeless tobacco: Never Used  . Alcohol Use: No  . Drug Use: No  . Sexual Activity: Not on file   Other Topics Concern  . Not on file   Social History Narrative   Patient lives at home with his  caregiver/ex-wife.   Caffeine- 2 cups of coffee daily     Review of Systems: General: negative for chills, fever, night sweats or weight changes.  Cardiovascular: negative for chest pain, dyspnea on exertion, edema, orthopnea, palpitations, paroxysmal nocturnal dyspnea or shortness of breath Dermatological: negative for rash Respiratory: negative for cough or wheezing Urologic: negative for hematuria Abdominal: negative for nausea, vomiting, diarrhea, bright red blood per rectum, melena, or hematemesis Neurologic: negative for visual changes, syncope, or dizziness All other systems reviewed and are otherwise negative except as noted above.    Blood pressure 152/80, pulse 64, height 5\' 7"  (1.702 m), weight 179 lb (81.194 kg).  General appearance: alert and no  distress Neck: no adenopathy, no carotid bruit, no JVD, supple, symmetrical, trachea midline and thyroid not enlarged, symmetric, no tenderness/mass/nodules Lungs: clear to auscultation bilaterally Heart: regular rate and rhythm, S1, S2 normal, no murmur, click, rub or gallop Extremities: extremities normal, atraumatic, no cyanosis or edema  EKG normal sinus rhythm at 64 without specific ST-T wave changes. I personally reviewed this EKG  ASSESSMENT AND PLAN:   Occlusion and stenosis of carotid artery without mention of cerebral infarction History of carotid artery disease status post bilateral carotid endarterectomies back in 1999 and subsequent bilateral carotid stenting by myself and Dr. Trula Slade with staged fashion for restenosis back in 2012.  Hyperlipidemia History of hyperlipidemia on Crestor 30 mg a day with recent lipid profile performed 08/01/14 revealed a total cholesterol 151, LDL 74 and HDL of 31  Coronary artery disease History of coronary artery disease status post coronary artery bypass grafting in 2004 by Dr. Roxan Hockey with a Myoview performed 01/09/11 which was nonischemic. He denies chest pain or shortness of breath      Lorretta Harp MD [facility], [facility] 04/03/2015 1:16 PM

## 2015-04-03 NOTE — Assessment & Plan Note (Signed)
History of carotid artery disease status post bilateral carotid endarterectomies back in 1999 and subsequent bilateral carotid stenting by myself and Dr. Trula Slade with staged fashion for restenosis back in 2012.

## 2015-04-03 NOTE — Assessment & Plan Note (Signed)
History of hyperlipidemia on Crestor 30 mg a day with recent lipid profile performed 08/01/14 revealed a total cholesterol 151, LDL 74 and HDL of 31

## 2015-04-03 NOTE — Assessment & Plan Note (Signed)
History of coronary artery disease status post coronary artery bypass grafting in 2004 by Dr. Roxan Hockey with a Myoview performed 01/09/11 which was nonischemic. He denies chest pain or shortness of breath

## 2015-04-10 LAB — LIPID PANEL
Cholesterol: 143 mg/dL (ref 0–200)
HDL: 33 mg/dL — ABNORMAL LOW (ref >=40)
LDL Cholesterol: 69 mg/dL (ref 0–99)
Total CHOL/HDL Ratio: 4.3 Ratio
Triglycerides: 205 mg/dL — ABNORMAL HIGH (ref <150)
VLDL: 41 mg/dL — ABNORMAL HIGH (ref 0–40)

## 2015-04-10 LAB — HEPATIC FUNCTION PANEL
ALT: 13 U/L (ref 0–53)
AST: 18 U/L (ref 0–37)
Albumin: 3.9 g/dL (ref 3.5–5.2)
Alkaline Phosphatase: 63 U/L (ref 39–117)
Bilirubin, Direct: 0.1 mg/dL (ref 0.0–0.3)
Indirect Bilirubin: 0.2 mg/dL (ref 0.2–1.2)
Total Bilirubin: 0.3 mg/dL (ref 0.2–1.2)
Total Protein: 6.5 g/dL (ref 6.0–8.3)

## 2015-04-14 ENCOUNTER — Other Ambulatory Visit: Payer: Self-pay | Admitting: Diagnostic Neuroimaging

## 2015-04-14 ENCOUNTER — Other Ambulatory Visit: Payer: Self-pay | Admitting: Physical Medicine & Rehabilitation

## 2015-04-17 ENCOUNTER — Telehealth: Payer: Self-pay | Admitting: Cardiovascular Disease

## 2015-04-17 NOTE — Telephone Encounter (Signed)
Would like his lab results from 04-10-15 please.

## 2015-04-17 NOTE — Telephone Encounter (Signed)
Notified caregiver of results. Informed her that copy of lab results was placed in mail last week

## 2015-04-19 ENCOUNTER — Encounter: Payer: Self-pay | Admitting: Diagnostic Neuroimaging

## 2015-04-19 ENCOUNTER — Ambulatory Visit (INDEPENDENT_AMBULATORY_CARE_PROVIDER_SITE_OTHER): Payer: Medicare Other | Admitting: Diagnostic Neuroimaging

## 2015-04-19 VITALS — BP 161/80 | HR 64 | Ht 67.0 in | Wt 179.0 lb

## 2015-04-19 DIAGNOSIS — G250 Essential tremor: Secondary | ICD-10-CM | POA: Diagnosis not present

## 2015-04-19 DIAGNOSIS — I6930 Unspecified sequelae of cerebral infarction: Secondary | ICD-10-CM

## 2015-04-19 DIAGNOSIS — G20 Parkinson's disease: Secondary | ICD-10-CM

## 2015-04-19 DIAGNOSIS — Z8673 Personal history of transient ischemic attack (TIA), and cerebral infarction without residual deficits: Secondary | ICD-10-CM

## 2015-04-19 DIAGNOSIS — G40909 Epilepsy, unspecified, not intractable, without status epilepticus: Secondary | ICD-10-CM

## 2015-04-19 NOTE — Progress Notes (Signed)
[facility]  PATIENT: [name] DOB: 1946-09-22  REFERRING CLINICIAN:  HISTORY FROM: patient and wife REASON FOR VISIT: follow up   HISTORICAL  CHIEF COMPLAINT:  Chief Complaint  Patient presents with  . Parkinson's    rm 7, caregiver- [name], checking since prescribing Carbidopa-Levodopa  . Cerebrovascular Accident  . Follow-up    HISTORY OF PRESENT ILLNESS:   UPDATE 04/19/15: Since last visit, doing better. Less choking on saliva. Postural and action tremor stable. Balance stable. Mainly with postural and action tremor.   UPDATE 12/18/14: Since last visit, stable. On carb/levo half tab TID. May have had some improvement in tremor. Still using single point cane. No falls since last visit.   UPDATE 09/13/14: Since last visit, symptoms are stable. Still falling. More problems due to right sided slowness and chronic left hemiparesis. Asking about parkinson's dz meds, diagnosis, treatment, supplements.   UPDATE 03/08/14: Since last visit, no seizures. Has been falling more. Has noticed hand writing deterioration over past 3 years. Also intermittent right hand tremor x 3-6 months (per wife). Patient noted some right hand tremor even in 1999.  UPDATE 03/08/13: Since last visit patient doing well. No seizures since 2005. Patient is stable on Dilantin and Keppra brand name medications. Patient's swallowing has improved since last visit. He make sure to take liquids following each mouthful of food.  PRIOR HPI (08/27/12, Dr. Erling Cruz): 69 year old right-handed white divorced male from [place], [place] who is cared for by his ex-first wife. He has a known history of hypertension, right MCA stroke 3/99 associated with residual left hemiparesis, acute right MCA stroke 09/2003,coronary artery disease status post 3 vessel bypass in 2004, bilateral carotid endarterectomies in 1999 and bilateral stent placement 5/12, recurrent TIAs versus seizures, documented seizure  11/12/2006, and Dilantin toxicity. His last MRI of the brain without contrast 11/13/2006 showed a large chronic right middle artery infarct involving the posterior temporal and parietal lobe and right thalamus. MR angiogram of the head without contrast showed intracranial atherosclerotic disease and occlusion of the right middle cerebral artery branch corresponding to the right MCA infarct. There was mild atherosclerotic vascular disease elsewhere and no aneurysm. Doppler study of the carotids 3/5/009 was negative for hemodynamically significant stenosis in the intracranial arteries.  EEG 11/13/2006 showed rhythmic discharges correlating with the EKG and were thought to be artifact. . His last seizure was 11/18/2006. He subsequently had one episode of confusion 11/26/2009 which might have been a seizure. He was at his son's house, came home, looked in the  driveway, and asked where his car was. His car had been given to someone else in the family. He kept asking about the car and recognized that he was confused. The episode lasted 40 minutes. The patient lives with his caretaker. He is able to bathe himself and  take care of his toileting needs, but requires assistance in dressing. He  exercises at the [facility] 2 days per week. He has falls with a fall assessment tool score of 10. He denies macropsia, micropsia, strange odors or tastes. His caretaker reports that at times he has left knee hyperextension. He uses a left leg brace. He denies TIA symptoms or warnings of seizures.He has finished 2 months of PT in no rehabilitation and has a new left foot brace, which is not associated with hyperextension of his knee. He denies spasticity or spasms. He enjoys playing "We" 2 times per day for exercise.His wife reports that he chokes on his saliva. He  has not had  unexplained fever or chills. Last trough levetiracetam level 15.9  and phenytoin level 13.4 on 03/11/2012.  REVIEW OF SYSTEMS: Full 14 system review of systems performed  and notable only for tremors.   ALLERGIES: Allergies  Allergen Reactions  . Altace [Ramipril] Cough    HOME MEDICATIONS: Outpatient Prescriptions Prior to Visit  Medication Sig Dispense Refill  . AGGRENOX 25-200 MG per 12 hr capsule TAKE 1 CAPSULE BY MOUTH 2 (TWO) TIMES DAILY. 180 capsule 4  . BENICAR 20 MG tablet TAKE 1 TABLET BY MOUTH DAILY 90 tablet 0  . carbidopa-levodopa (SINEMET IR) 25-100 MG per tablet Take 0.5-1 tablets by mouth 3 (three) times daily. 90 tablet 12  . Coenzyme Q10 (COQ10) 50 MG CAPS Take 100 mg by mouth daily.     . dantrolene (DANTRIUM) 50 MG capsule TAKE 1 CAPSULE BY MOUTH TWICE DAILY 180 capsule 1  . fenofibrate (TRICOR) 48 MG tablet TAKE 1 TABLET BY MOUTH DAILY 90 tablet 0  . Flaxseed, Linseed, 1000 MG CAPS Take 1 capsule by mouth daily.     . FOLBIC 2.5-25-2 MG TABS TAKE 1 TABLET BY MOUTH DAILY. 90 tablet 4  . FOLBIC 2.5-25-2 MG TABS TAKE 1 TABLET BY MOUTH DAILY. 90 tablet 4  . KEPPRA 500 MG tablet TAKE 1 TABLET BY MOUTH TWICE DAILY 180 tablet 3  . Magnesium 250 MG TABS Take 1 tablet by mouth daily.      . metoprolol tartrate (LOPRESSOR) 25 MG tablet Take 1 tablet (25 mg total) by mouth daily. 90 tablet 2  . nitroGLYCERIN (NITROSTAT) 0.4 MG SL tablet Place 1 tablet (0.4 mg total) under the tongue every 5 (five) minutes as needed for chest pain. 25 tablet 3  . phenytoin (DILANTIN) 100 MG ER capsule Take 1 capsule (100 mg total) by mouth 3 (three) times daily. 270 capsule 4  . rosuvastatin (CRESTOR) 20 MG tablet Take 1.5 tablets (30 mg total) by mouth daily. 135 tablet 1  . vitamin C (ASCORBIC ACID) 500 MG tablet Take 500 mg by mouth daily.      Marland Kitchen ZETIA 10 MG tablet TAKE 1 TABLET BY MOUTH DAILY 30 tablet 0   No facility-administered medications prior to visit.    PAST MEDICAL HISTORY: Past Medical History  Diagnosis Date  . Hyperlipidemia   . Hypertension   . Myocardial infarction 11/2002  . Stroke   . Seizure disorder   . Carotid stent occlusion  bilateral 02/2011  . CAD (coronary artery disease)   . Peripheral arterial disease   . Cause of injury, fall 03/01/12  . Pancreatic pseudocyst     PAST SURGICAL HISTORY: Past Surgical History  Procedure Laterality Date  . Cardiac catheterization  11/17/2002    significant left main disease and 3 vessel CAD, mildly depressed LV systolic  fx, 78% left renal artery stenosis  . Coronary artery bypass graft  11/2002    x3 LIMA to LAD,free right internal mammary artery to obtuse marginal 1,SVG to distal right coronary.  . Ankle surgery  1998  . Carotid endarterectomy  12/1997    Bilateral   . Carotid endarterectomy  02/12/11    Left Carotid  . Carotid endarterectomy  02/26/11    Right Carotid  . Carotid stents Bilateral   . US echocardiography  01/03/2011    LA mildly dilated,trace MR,TR,AOV mildly sclerotic.  Marland Kitchen Nm myocar perf wall motion  01/09/2011    mild to mod. perfusion defect in the basal inferolateral & mid  inferolaterl regions c/w infarct/scar. No significant ishcemia demonstrated.    FAMILY HISTORY: Family History  Problem Relation Age of Onset  . Stroke Father   . Heart attack Father   . Diabetes Brother   . Hyperlipidemia Brother   . Hypertension Brother   . Hyperlipidemia Sister   . Hypertension Sister   . Stroke Mother     SOCIAL HISTORY:  History   Social History  . Marital Status: Divorced    Spouse Name: N/A  . Number of Children: 4  . Years of Education: HS   Occupational History  .     Social History Main Topics  . Smoking status: Former Smoker    Types: Cigarettes    Quit date: 11/14/2007  . Smokeless tobacco: Never Used  . Alcohol Use: No  . Drug Use: No  . Sexual Activity: Not on file   Other Topics Concern  . Not on file   Social History Narrative   Patient lives at home with his caregiver/ex-wife.   Caffeine- 2 cups of coffee daily     PHYSICAL EXAM  Filed Vitals:   04/19/15 1255  BP: 161/80  Pulse: 64  Height: 5\' 7"  (1.702 m)    Weight: 179 lb (81.194 kg)    Not recorded      Body mass index is 28.03 kg/(m^2).  GENERAL EXAM: Patient is in no distress; MASKED FACIES  CARDIOVASCULAR: Regular rate and rhythm, no murmurs, no carotid bruits  NEUROLOGIC: MENTAL STATUS: awake, alert, SLIGHTLY DECR FLUENCY. Comprehension intact, naming intact CRANIAL NERVE: pupils equal and reactive to light, visual fields full to confrontation, extraocular muscles intact, no nystagmus, facial sensation symmetric; DECR LEFT LOWER FACIAL STRENGTH. MILD DYSARTHRIA uvula midline, shoulder shrug symmetric, tongue midline. MOTOR: INCR TONE IN LUE AND LLE. LUE 1-2/5 PROX, 0 DISTAL. LLE 3/5 WITH FOOT DROP. RARE REST TREMOR IN RUE. MILD RUE POSTURAL TREMOR. MOD BRADYKINESIA WITH RUE AND RLE.  SENSORY: DECR ON LEFT SIDE COORDINATION: finger-nose-finger, fine finger movements SLOW ON RIGHT. LIMITED ON LEFT DUE TO WEAKNESS. REFLEXES: BRISK IN LUE AND LLE. GAIT/STATION: LEFT HEMIPARIETIC GAIT; USES ROLLATOR WALKER WITH 1 HAND; HESITATION AND FREEZING ON INITIATION; UNSTEADY WITH CLUMSINESS ON RIGHT SIDE.    DIAGNOSTIC DATA (LABS, IMAGING, TESTING) - I reviewed patient records, labs, notes, testing and imaging myself where available.  Lab Results  Component Value Date   WBC 7.7 07/13/2013   HGB 14.3 07/13/2013   HCT 42.0 07/13/2013   MCV 97.9 07/13/2013   PLT 183 07/13/2013      Component Value Date/Time   NA 138 07/13/2013 0814   K 5.0 07/13/2013 0814   CL 103 07/13/2013 0814   CO2 25 07/13/2013 0814   GLUCOSE 107* 07/13/2013 0814   BUN 16 07/13/2013 0814   CREATININE 1.11 07/13/2013 0814   CREATININE 0.80 05/23/2011 0231   CALCIUM 9.2 07/13/2013 0814   PROT 6.5 04/10/2015 0846   ALBUMIN 3.9 04/10/2015 0846   AST 18 04/10/2015 0846   ALT 13 04/10/2015 0846   ALKPHOS 63 04/10/2015 0846   BILITOT 0.3 04/10/2015 0846   GFRNONAA >60 05/23/2011 0231   GFRAA >60 05/23/2011 0231   Lab Results  Component Value Date   CHOL 143  04/10/2015   HDL 33* 04/10/2015   LDLCALC 69 04/10/2015   TRIG 205* 04/10/2015   CHOLHDL 4.3 04/10/2015   Lab Results  Component Value Date   HGBA1C 6.5* 05/23/2011   Lab Results  Component Value Date   VITAMINB12  1948* 05/23/2011   Lab Results  Component Value Date   TSH 3.245 07/13/2013    10/19/14 MRI BRAIN 1. Chronic right hemispheric ischemic infarction. 2. Mild chronic small vessel ischemic disease. 3. No acute findings. No change from MRI on 11/13/06.    ASSESSMENT AND PLAN  69 y.o. year old male  has a past medical history of Hyperlipidemia; Hypertension; Myocardial infarction (11/2002); Stroke; Seizure disorder; Carotid stent occlusion (bilateral 02/2011); CAD (coronary artery disease); Peripheral arterial disease; Cause of injury, fall (03/01/12); and Pancreatic pseudocyst. here with history of right MCA stroke and seizure disorder. Patient is doing well on brand name Keppra and Dilantin. Now with right body hemiparkinsonism since 2013. May also have had essential tremor since the 1960's (in retrospect).   Dx: Chronic ischemic right MCA stroke  Parkinsonism  Seizure disorder  Essential tremor    PLAN: I spent 25 minutes of face to face time with patient. Greater than 50% of time was spent in counseling and coordination of care with patient. In summary we discussed:  1. Continue brand name dilantin and keppra for seizure control 2. Continue carb/levo 1 tab TID parkinsonism symptoms affecting the right side; may consider primidone in future for essential tremor, but difficulty due to polypharmacy 3. High fall risk due to combination of chronic right MCA stroke and now with right body hemiparkinsonism; use wheelchair and rollator walker with seat  Return in about 6 months (around 10/20/2015).     Penni Bombard, MD 10/16/4313, 4:00 PM Certified in Neurology, Neurophysiology and Neuroimaging  [facility] [address] [place],  [place] [address] [phone]

## 2015-04-24 ENCOUNTER — Other Ambulatory Visit: Payer: Self-pay | Admitting: Diagnostic Neuroimaging

## 2015-04-24 ENCOUNTER — Other Ambulatory Visit: Payer: Self-pay | Admitting: Cardiovascular Disease

## 2015-04-24 NOTE — Telephone Encounter (Signed)
Rx(s) sent to pharmacy electronically.  

## 2015-05-15 ENCOUNTER — Other Ambulatory Visit: Payer: Self-pay | Admitting: Cardiovascular Disease

## 2015-05-15 NOTE — Telephone Encounter (Signed)
REFILL 

## 2015-05-25 ENCOUNTER — Other Ambulatory Visit: Payer: Self-pay | Admitting: Diagnostic Neuroimaging

## 2015-06-20 HISTORY — PX: MOHS SURGERY: SUR867

## 2015-06-27 ENCOUNTER — Encounter: Payer: Medicare Other | Attending: Physical Medicine & Rehabilitation | Admitting: Physical Medicine & Rehabilitation

## 2015-07-20 ENCOUNTER — Other Ambulatory Visit: Payer: Self-pay | Admitting: Cardiovascular Disease

## 2015-07-20 NOTE — Telephone Encounter (Signed)
Rx request sent to pharmacy.  

## 2015-08-15 ENCOUNTER — Telehealth: Payer: Self-pay

## 2015-08-15 NOTE — Telephone Encounter (Signed)
Okay to hold his Aggrenox for his urologic procedure

## 2015-08-15 NOTE — Telephone Encounter (Signed)
Requesting surgical clearance:   1. Type of surgery: Transurethral Resection of Bladder Tumor  2. Surgeon: Dr. Gaynelle Arabian  3. Surgical date: pending clearance  4. Medications that need to be held: aggrenox  5. CAD: Yes     6. I will defer to: Dr. Gwenlyn Found   [facility] [phone] phone [phone]. [phone] fax

## 2015-08-16 ENCOUNTER — Other Ambulatory Visit: Payer: Self-pay | Admitting: Urology

## 2015-08-16 NOTE — Telephone Encounter (Signed)
Pt clearance faxed to Dr. Arlyn Leak office

## 2015-08-21 ENCOUNTER — Encounter: Payer: Self-pay | Admitting: Physical Medicine & Rehabilitation

## 2015-08-21 ENCOUNTER — Encounter: Payer: Medicare Other | Attending: Physical Medicine & Rehabilitation | Admitting: Physical Medicine & Rehabilitation

## 2015-08-21 VITALS — BP 159/79 | HR 65

## 2015-08-21 DIAGNOSIS — G8110 Spastic hemiplegia affecting unspecified side: Secondary | ICD-10-CM

## 2015-08-21 DIAGNOSIS — I6330 Cerebral infarction due to thrombosis of unspecified cerebral artery: Secondary | ICD-10-CM | POA: Insufficient documentation

## 2015-08-21 DIAGNOSIS — G20 Parkinson's disease: Secondary | ICD-10-CM | POA: Diagnosis not present

## 2015-08-21 NOTE — Patient Instructions (Signed)
PLEASE CALL ME WITH ANY PROBLEMS OR QUESTIONS (#[phone]).  HAVE A GOOD DAY!    

## 2015-08-21 NOTE — Progress Notes (Signed)
Subjective:    Patient ID: [name], male    DOB: 10/05/1946, 69 y.o.   MRN: [id number]  HPI   [name] is here in follow up of his chronic left spastic hemiparesis. He was diagnosed with diabetes and bladder cancer. He is going for a TURBT later this month with  [facility]---first symptoms were hematuria.    I reviewed his recent LFT's which were normal.   He uses sinemet still for his PD, 1 tab TID . He has been trying to keep up with his stretching and ROM as much as possible. He hasn't been walking quite as much of late however.      Pain Inventory Average Pain 0 Pain Right Now 0 My pain is No Pain  In the last 24 hours, has pain interfered with the following? General activity 0 Relation with others 0 Enjoyment of life 0 What TIME of day is your pain at its worst? NA Sleep (in general) NA  Pain is worse with: NA Pain improves with: NA Relief from Meds: NA  Mobility walk with assistance use a cane use a walker how many minutes can you walk? 4-5 ability to climb steps?  yes do you drive?  yes Do you have any goals in this area?  no  Function I need assistance with the following:  meal prep, household duties and shopping Do you have any goals in this area?  yes  Neuro/Psych weakness tremor trouble walking confusion  Prior Studies Any changes since last visit?  yes  Physicians involved in your care Any changes since last visit?  yes   Family History  Problem Relation Age of Onset  . Stroke Father   . Heart attack Father   . Diabetes Brother   . Hyperlipidemia Brother   . Hypertension Brother   . Hyperlipidemia Sister   . Hypertension Sister   . Stroke Mother    Social History   Social History  . Marital Status: Divorced    Spouse Name: N/A  . Number of Children: 4  . Years of Education: HS   Occupational History  .     Social History Main Topics  . Smoking status: Former Smoker    Types: Cigarettes    Quit date:  11/14/2007  . Smokeless tobacco: Never Used  . Alcohol Use: No  . Drug Use: No  . Sexual Activity: Not Asked   Other Topics Concern  . None   Social History Narrative   Patient lives at home with his caregiver/ex-wife.   Caffeine- 2 cups of coffee daily   Past Surgical History  Procedure Laterality Date  . Cardiac catheterization  11/17/2002    significant left main disease and 3 vessel CAD, mildly depressed LV systolic  fx, 00% left renal artery stenosis  . Coronary artery bypass graft  11/2002    x3 LIMA to LAD,free right internal mammary artery to obtuse marginal 1,SVG to distal right coronary.  . Ankle surgery  1998  . Carotid endarterectomy  12/1997    Bilateral   . Carotid endarterectomy  02/12/11    Left Carotid  . Carotid endarterectomy  02/26/11    Right Carotid  . Carotid stents Bilateral   . US echocardiography  01/03/2011    LA mildly dilated,trace MR,TR,AOV mildly sclerotic.  Marland Kitchen Nm myocar perf wall motion  01/09/2011    mild to mod. perfusion defect in the basal inferolateral & mid inferolaterl regions c/w infarct/scar. No significant ishcemia demonstrated.  Past Medical History  Diagnosis Date  . Hyperlipidemia   . Hypertension   . Myocardial infarction ([place]) 11/2002  . Stroke ([place])   . Seizure disorder ([place])   . Carotid stent occlusion ([facility]) bilateral 02/2011  . CAD (coronary artery disease)   . Peripheral arterial disease ([place])   . Cause of injury, fall 03/01/12  . Pancreatic pseudocyst   . Bladder cancer ([facility])    BP 159/79 mmHg  Pulse 65  SpO2 97%  Opioid Risk Score:   Fall Risk Score:  `1  Depression screen PHQ 2/9  No flowsheet data found.   Review of Systems  Endocrine:       High Blood Sugar  Neurological: Positive for tremors and weakness.       Gait Instabiltity  Hematological: Bruises/bleeds easily.  Psychiatric/Behavioral: Positive for confusion.  All other systems reviewed and are negative.      Objective:   Physical  Exam  Constitutional: He is oriented to person, place, and time. He appears well-developed and well-nourished.  HENT:  Head: Normocephalic and atraumatic.  Right Ear: External ear normal.  Left Ear: External ear normal.  Nose: Nose normal.  Mouth/Throat: Oropharynx is clear and moist.  Eyes: Conjunctivae and EOM are normal. Pupils are equal, round, and reactive to light.  Neck: Normal range of motion. Neck supple.  Cardiovascular: Normal rate and regular rhythm.  Pulmonary/Chest: Effort normal.  Abdominal: Soft. Bowel sounds are normal.  Musculoskeletal: Normal range of motion.  Neurological: He is alert and oriented to person, place, and time. A cranial nerve deficit is present.  LLE is 1-3/5. LUE is 1-3/5. Spasticity is 1/4 in LUE including wrist, HI, biceps, shoulder .Marland Kitchen Sensation 1/2 in both upper and lower. Pill rolling tremor right upper ext. Occasionally has difficulty initiating movement in the right leg. Slight masked facies. No rigidity. He was in a w/c today. Cognition was at baseline. Psychiatric: He has a normal mood and affect. His behavior is normal. Judgment and thought content normal.  Skin: healed scar right nose, mandibular areas.     Assessment & Plan:   ASSESSMENT:  1. Right cerebrovascular accident, spastic left hemiparesis.  2. Seizure disorder.  3. Parkinson's disease 4. Bladder cancer   PLAN:  1. Continue sinemet for PD symptoms. May be helping some of his tightness on the left side.  2. Continue with Dilantin and keppra per neuro recs.  3. Dantrium will be continued for spasticity management. LFT's normal.  4. Blader cancer mgt per urology. 5.12 months follow up. All questions were encouraged and answered. He will call me back prn.

## 2015-08-28 ENCOUNTER — Telehealth: Payer: Self-pay | Admitting: Diagnostic Neuroimaging

## 2015-08-28 NOTE — Telephone Encounter (Signed)
Patient is calling as she got a letter in the mail stating her husband's insurance will require authorization in 2017 for Rx Aggrenox 25-200 mg per 12 hr capsule.  She will drop the form by tomorrow.  Thanks

## 2015-09-03 ENCOUNTER — Encounter (HOSPITAL_BASED_OUTPATIENT_CLINIC_OR_DEPARTMENT_OTHER): Payer: Self-pay | Admitting: *Deleted

## 2015-09-03 NOTE — Progress Notes (Signed)
SPOKE W/ CAREGIVER/EX-WIFE (HPOA), [name] .  ARRIVE AT 0830.  NEEDS ISTAT AND PSA.  CURRENT EKG IN CHART AND EPIC.  WILL TAKE DANTROLENE, SINEMET , DILANTIN, AND KEPPRA  AM DOS W/ SIPS OF WATER.  REVIEWED [facility] GUIDELINES , WILL BRING MEDS.  [name] PLANS TO STAY OVERNIGHT.

## 2015-09-05 NOTE — Telephone Encounter (Signed)
I called and spoke with Caregiver.  She verified ins has not changed, but formulary changes as of Jan 1.  Advised we will be happy to contact ins and try to get them to grant an exception.   Ins has been contacted and provided with clinical info.  Request is under review Ref # [facility]) has approved the request for continuation of coverage on Aggrenox effective until 10/12/2016 Ref # [id number]

## 2015-09-09 NOTE — H&P (Signed)
Reason For Visit Cystoscopy and review CT results   Active Problems Problems  1. Elevated prostate specific antigen (PSA) (R97.20) 2. Gross hematuria (R31.0)  History of Present Illness     69 yo male returns today for cystoscopy & to review CT results. Hx of gross hematuria & he is currently on an anticoagulant. Previous hx of a PSA elevation of 3.83, then 5.28/11.9% noted by Dr. Paulino Rily. He is no longer using Androgel. PSA 6.61/16% in Oct 2010. In the past, he didn't want to stop his Aggrenox for biopsy, and pharmacist "would not prescribe" Levaquin for him.     11/28/11 PSA - 4.85/11%  04/23/10 Psa 5.07/13%,  01/22/10 Psa 6.06/23.4%  10/11/09 Psa 5.28/11.9%.   Past Medical History Problems  1. History of Acute Myocardial Infarction 2. History of basal cell carcinoma (Z85.828) 3. History of cardiac disorder (Z86.79) 4. History of hyperlipidemia (Z86.39) 5. History of hypertension (Z86.79) 6. History of seizure disorder (Z86.69) 7. History of type 2 diabetes mellitus (Z86.39) 8. History of Pancreatitis 9. History of Stroke syndrome (I63.9)  Surgical History Problems  1. History of CABG 2. History of Cholecystectomy 3. History of Surgery Vas Deferens Vasectomy  Current Meds 1. Aggrenox 25-200 MG Oral Capsule Extended Release 12 Hour;  Therapy: (Recorded:12Oct2010) to Recorded 2. Benicar 20 MG Oral Tablet;  Therapy: 26Mar2013 to Recorded 3. Carbidopa-Levodopa 25-100 MG Oral Tablet;  Therapy: (Recorded:01Nov2016) to Recorded 4. CoQ10 50 MG Oral Capsule;  Therapy: (Recorded:12Oct2010) to Recorded 5. Crestor 20 MG Oral Tablet;  Therapy: (Recorded:12Oct2010) to Recorded 6. Dantrolene Sodium 50 MG Oral Capsule;  Therapy: (Recorded:12Oct2010) to Recorded 7. Dilantin 100 MG Oral Capsule;  Therapy: (Recorded:26Apr2013) to Recorded 8. Flaxseed Oil 1000 MG Oral Capsule;  Therapy: (Recorded:12Oct2010) to Recorded 9. Folbic 2.5-25-2 MG Oral Tablet;  Therapy:  (Recorded:01Nov2016) to Recorded 10. Folbic 2.5-25-2 MG Oral Tablet;   Therapy: (Recorded:12Oct2010) to Recorded 11. Keppra 500 MG Oral Tablet;   Therapy: (Recorded:12Oct2010) to Recorded 12. Magnesium 250 MG Oral Tablet;   Therapy: (Recorded:26Apr2013) to Recorded 13. Metoprolol Tartrate 25 MG Oral Tablet;   Therapy: (Recorded:12Oct2010) to Recorded 14. Tricor 48 MG Oral Tablet;   Therapy: (Recorded:01Nov2016) to Recorded 15. Vitamin C TABS;   Therapy: (Recorded:12Oct2010) to Recorded 16. Vitamin D TABS;   Therapy: (Recorded:01Nov2016) to Recorded 17. Zetia 10 MG Oral Tablet;   Therapy: (Recorded:12Oct2010) to Recorded  Allergies Medication  1. No Known Drug Allergies  Family History Problems  1. Family history of Diabetes Mellitus : Brother 2. Denied: Family history of Family Health Status - Mother's Age : Father   64 3. Family history of Family Health Status Number Of Children : Father   three sons and one daughter 33. Family history of Father Deceased At Age ___ : Father   39 5. Family history of Hyperlipidemia : Sister 6. Family history of Hypertension : Brother 7. Family history of Hypertension : Father  Social History Problems  1. Denied: History of Alcohol Use (History) 2. Caffeine Use   two cups of cofee daily 3. Former smoker [phone]) 4. Marital History - Divorced 5. Mother deceased   71 6. Number of children   3 sons/ 1 daughter 7. Occupation:   retired 8. Retired 9. Tobacco Use   quit 2 1/2 yrs ago smoked 2 packs daily for 48 years  Review of Systems Genitourinary, constitutional, skin, eye, otolaryngeal, hematologic/lymphatic, cardiovascular, pulmonary, endocrine, musculoskeletal, gastrointestinal, neurological and psychiatric system(s) were reviewed and pertinent findings if present are noted and are otherwise negative.  Genitourinary: hematuria and erectile dysfunction.  Hematologic/Lymphatic: a tendency to easily bruise.     Vitals Vital Signs [Data Includes: Last 1 Day]  Recorded: [id number] 01:19PM  Blood Pressure: 141 / 74 Temperature: 97.6 F Heart Rate: 66  Physical Exam Constitutional: Well nourished and well developed . No acute distress. The patient appears well hydrated. Wheelchair-bound.  ENT:. The ears and nose are normal in appearance. Hearing loss is noted.  Neck: The appearance of the neck is normal.  Pulmonary: No respiratory distress.  Cardiovascular:. No peripheral edema.  Abdomen: The abdomen is rounded, but not distended. The abdomen is soft and nontender. No masses are palpated. Bowel sounds are normal. No hernias are palpable. not tender  Genitourinary: Examination of the penis demonstrates no discharge, no masses, no lesions and a normal meatus. The penis is uncircumcised. The scrotum is without lesions. The right epididymis is palpably normal and non-tender. The left epididymis is palpably normal and non-tender. The right testis is non-tender and without masses. The left testis is non-tender and without masses.  Lymphatics: The femoral nodes are not enlarged or tender.  Skin: Normal skin turgor.  Neuro/Psych:. Mood and affect are appropriate.    Results/Data Selected Results  CREATININE with eGFR [id number] 01:10PM Carolan Clines  SPECIMEN TYPE: BLOOD   Test Name Result Flag Reference  CREATININE 1.20 mg/dL  0.50-1.50  Est GFR, African American 70 mL/min  >=60  PERFORMED AT:        [facility]                      [place]                      [place],  [id number]  Est GFR, NonAfrican American 60 mL/min  >=60  THE ESTIMATED GFR IS A CALCULATION VALID FOR ADULTS (>=66 YEARS OLD) THAT USES THE CKD-EPI ALGORITHM TO ADJUST FOR AGE AND SEX. IT IS   NOT TO BE USED FOR CHILDREN, PREGNANT WOMEN, HOSPITALIZED PATIENTS,    PATIENTS ON DIALYSIS, OR WITH RAPIDLY CHANGING KIDNEY FUNCTION. ACCORDING TO THE NKDEP, EGFR >89 IS NORMAL, 60-89 SHOWS MILD IMPAIRMENT, 30-59  SHOWS MODERATE IMPAIRMENT, 15-29 SHOWS SEVERE IMPAIRMENT AND <15 IS ESRD.   HEMOGLOBIN & HEMATOCRIT [id number] 01:10PM Carolan Clines  SPECIMEN TYPE: BLOOD   Test Name Result Flag Reference  HEMATOCRIT 41.8 %  39.0-52.0  HEMOGLOBIN 14.3 g/dL  13.0-17.0   UA With REFLEX [id number] 12:02PM Carolan Clines  SPECIMEN TYPE: CLEAN CATCH   Test Name Result Flag Reference  COLOR RED A YELLOW  ** PLEASE NOTE CHANGE IN UNIT OF MEASURE AND REFERENCE RANGE(S). **   BIOCHEMICALS MAY BE AFFECTED BY THE COLOR OF THE URINE.  APPEARANCE CLOUDY A CLEAR  SPECIFIC GRAVITY 1.010  1.001-1.035  pH 5.0  5.0-8.0  GLUCOSE NEGATIVE  NEGATIVE  BILIRUBIN NEGATIVE  NEGATIVE  KETONE NEGATIVE  NEGATIVE  BLOOD 3+ A NEGATIVE  PROTEIN 1+ A NEGATIVE  NITRITE NEGATIVE  NEGATIVE  LEUKOCYTE ESTERASE NEGATIVE  NEGATIVE  SQUAMOUS EPITHELIAL/HPF NONE SEEN HPF  <=5  WBC NONE SEEN WBC/HPF  <=5  RBC 40-60 RBC/HPF A <=2  BACTERIA NONE SEEN HPF  NONE SEEN  CRYSTALS NONE SEEN HPF  NONE SEEN  CASTS NONE SEEN LPF  NONE SEEN  Yeast NONE SEEN HPF  NONE SEEN   AU CT-HEMATURIA PROTOCOL [id number] 12:00AM Carolan Clines   Test Name Result Flag Reference  AU CT-HEMATURIA PROTOCOL (Report)    **  RADIOLOGY REPORT BY [facility] **   CLINICAL DATA: Gross and microscopic hematuria, starting last night. History of non melanoma skin cancer. Painless. Elevated PSA. Nonsmoker for 3 years.  EXAM: CT ABDOMEN AND PELVIS WITHOUT AND WITH CONTRAST  TECHNIQUE: Multidetector CT imaging of the abdomen and pelvis was performed following the standard protocol before and following the bolus administration of intravenous contrast.  CONTRAST: 125 cc of Isovue-300  COMPARISON: 10/27/2011 abdominal CT. 05/22/2011 abdominal pelvic CT.  FINDINGS: Lower chest: 2 mm anterior right lung base nodule (image 8, series 8). This is similar to the 2012 exam and therefore benign. There is also a 3 mm right lower lobe  pulmonary nodule on the same image which is present and 2013 and therefore benign. Mild cardiomegaly with prior median sternotomy. No pericardial or pleural effusion.  Hepatobiliary: Calcification in the hepatic dome likely relates to remote infection or inflammation. Cholecystectomy, without biliary ductal dilatation.  Pancreas: Normal appearance the pancreatic head and uncinate process. The pancreatic body and tail area either markedly atrophic or have been surgically resected. Appearance is similar to the remote CT of 2013.  Spleen: Normal, without mass or ductal dilatation.  Adrenals/Urinary Tract: Normal right adrenal gland. Mild left adrenal nodularity. Left renal vascular calcifications. No renal calculi or hydronephrosis. No hydroureter or ureteric calculi. No bladder calculi.  Moderate left renal atrophy. An interpolar 1.3 cm left renal cyst. 4 mm left renal too small to characterize lesion (image 45, series 6). This measures greater than fluid density on coronal reformatted imaging (image 82, series 500 .) no right renal lesions.  Moderate renal collecting system opacification on delayed images. Good ureteric opacification on delayed images, without filling defect. Subtle irregularity of the superior bladder wall, most apparent on coronal reformatted images (image 63, series 500). The bladder is incompletely opacified with contrast on delayed images. Residual non-opacified urine within. Apparent dependent area of hypoenhancement (image 82, series 6)could be due to mixing or even blood within the bladder.  Stomach/Bowel: Normal stomach, without wall thickening. Normal colon, appendix, and terminal ileum. Normal small bowel.  Vascular/Lymphatic: Aortic and branch vessel atherosclerosis. Extensive ulcerative plaque throughout the abdominal aorta. No retroperitoneal or retrocrural adenopathy. Portacaval node measures 11 mm and is similar to decreased from the remote  exam, likely reactive.  A 9 mm right external iliac node (image 89, series 4). Similar to decreased since 2012 and therefore also likely reactive.  Reproductive: Mild prostatomegaly. Subtle hyperenhancement involving the posterior mid to apical gland (image 92, series 4).  Other: No significant free fluid.  Musculoskeletal: Degenerative partial fusion of the bilateral sacroiliac joints. Mild disc bulges at L4-5 and L5-S1  IMPRESSION: 1. Subtle superior bladder wall irregularity, for which a flat neoplasm cannot be excluded. Recommend correlation with cystoscopy. 2. Otherwise, no definite explanation for hematuria. A left renal too small to characterize lesion appears complex on coronal reformatted images. Potential clinical strategies include followup with renal protocol CT at 12 months or more definitive characterization with pre and post contrast abdominal MRI. 3. Advanced atherosclerosis, with extensive ulcerative plaque. 4. Subtle hyper enhancement involving the left side of the prostate, likely the peripheral zone. Given history of elevated PSA, consider physical exam correlation with attention to this area.   Electronically Signed  By: Abigail Miyamoto M.D.  On: 08/14/2015 16:17   Procedure  Procedure: Cystoscopy  Chaperone Present: kim lewis.  Indication: Hematuria.  Informed Consent: Risks, benefits, and potential adverse events were discussed and informed consent was obtained from the  patient.  Prep: The patient was prepped with betadine.  Anesthesia:. Local anesthesia was administered intraurethrally with 2% lidocaine jelly.  Antibiotic prophylaxis: Trimethoprim/Sulfamethoxazole . Septra DS.  Procedure Note:  Urethral meatus:. No abnormalities.  Anterior urethra: No abnormalities.  Prostatic urethra:. The lateral and median prostatic lobes were enlarged. An enlarged intravesical median lobe was visualized.  Bladder: Visulization was obscured due to blood. Examination  of the bladder demonstrated no clot within the bladder, no trabeculation and no diverticulum no erythematous mucosa, no ulcer, no edema and no cellules. Multiple tumors were identified in the bladder. A papillary tumor was seen in the bladder measuring approximately 5 cm in size. This tumor was located on the left side, on the posterior aspect, at the base of the bladder. Another papillary tumor was seen in the bladder. This tumor was located on the right side, on the anterior aspect, at the base of the bladder.    Assessment Assessed  1. 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 Gross hematuria (R31.0)  TCC bladder by cystoscopic exam. He has a significant hx of tobacco use, and has been on Aggrinox for many yeares post CVA. He needs Cardiology/medical clearance prior to TUR-BT of 5 cm Left lateral wall tumor and 3cm right posterior lateral wall tumor   Plan Gross hematuria  1. Start: Sulfamethoxazole-Trimethoprim 800-160 MG Oral Tablet; Take 1 tablet twice daily  Schedule ?Cardiology Clearance for Aggrinox Rx for TUR-BT as soon as possible.   Signatures Electronically signed by : Carolan Clines, M.D.; Aug 15 2015  5:26PM EST

## 2015-09-10 ENCOUNTER — Encounter (HOSPITAL_BASED_OUTPATIENT_CLINIC_OR_DEPARTMENT_OTHER)
Admission: RE | Disposition: A | Discharge status: Home / Self Care | Payer: Self-pay | Source: Ambulatory Visit | Attending: Urology

## 2015-09-10 ENCOUNTER — Encounter (HOSPITAL_BASED_OUTPATIENT_CLINIC_OR_DEPARTMENT_OTHER): Payer: Self-pay | Admitting: *Deleted

## 2015-09-10 ENCOUNTER — Ambulatory Visit (HOSPITAL_BASED_OUTPATIENT_CLINIC_OR_DEPARTMENT_OTHER)
Admission: RE | Admit: 2015-09-10 | Discharge: 2015-09-10 | Disposition: A | Discharge status: Home / Self Care | Payer: Medicare Other | Source: Ambulatory Visit | Attending: Urology | Admitting: Urology

## 2015-09-10 ENCOUNTER — Ambulatory Visit (HOSPITAL_BASED_OUTPATIENT_CLINIC_OR_DEPARTMENT_OTHER): Payer: Medicare Other | Admitting: Anesthesiology

## 2015-09-10 ENCOUNTER — Other Ambulatory Visit: Payer: Self-pay | Admitting: Cardiovascular Disease

## 2015-09-10 DIAGNOSIS — R972 Elevated prostate specific antigen [PSA]: Secondary | ICD-10-CM | POA: Insufficient documentation

## 2015-09-10 DIAGNOSIS — E119 Type 2 diabetes mellitus without complications: Secondary | ICD-10-CM | POA: Insufficient documentation

## 2015-09-10 DIAGNOSIS — G40909 Epilepsy, unspecified, not intractable, without status epilepticus: Secondary | ICD-10-CM | POA: Insufficient documentation

## 2015-09-10 DIAGNOSIS — I10 Essential (primary) hypertension: Secondary | ICD-10-CM | POA: Insufficient documentation

## 2015-09-10 DIAGNOSIS — G20 Parkinson's disease: Secondary | ICD-10-CM | POA: Diagnosis not present

## 2015-09-10 DIAGNOSIS — Z9049 Acquired absence of other specified parts of digestive tract: Secondary | ICD-10-CM | POA: Diagnosis not present

## 2015-09-10 DIAGNOSIS — Z85828 Personal history of other malignant neoplasm of skin: Secondary | ICD-10-CM | POA: Insufficient documentation

## 2015-09-10 DIAGNOSIS — I2510 Atherosclerotic heart disease of native coronary artery without angina pectoris: Secondary | ICD-10-CM | POA: Insufficient documentation

## 2015-09-10 DIAGNOSIS — I252 Old myocardial infarction: Secondary | ICD-10-CM | POA: Insufficient documentation

## 2015-09-10 DIAGNOSIS — Z79899 Other long term (current) drug therapy: Secondary | ICD-10-CM | POA: Insufficient documentation

## 2015-09-10 DIAGNOSIS — Z87891 Personal history of nicotine dependence: Secondary | ICD-10-CM | POA: Insufficient documentation

## 2015-09-10 DIAGNOSIS — I739 Peripheral vascular disease, unspecified: Secondary | ICD-10-CM | POA: Insufficient documentation

## 2015-09-10 DIAGNOSIS — C674 Malignant neoplasm of posterior wall of bladder: Secondary | ICD-10-CM | POA: Diagnosis not present

## 2015-09-10 DIAGNOSIS — C679 Malignant neoplasm of bladder, unspecified: Secondary | ICD-10-CM | POA: Diagnosis present

## 2015-09-10 DIAGNOSIS — Z7902 Long term (current) use of antithrombotics/antiplatelets: Secondary | ICD-10-CM | POA: Insufficient documentation

## 2015-09-10 DIAGNOSIS — Z951 Presence of aortocoronary bypass graft: Secondary | ICD-10-CM | POA: Insufficient documentation

## 2015-09-10 DIAGNOSIS — C670 Malignant neoplasm of trigone of bladder: Secondary | ICD-10-CM

## 2015-09-10 DIAGNOSIS — I69354 Hemiplegia and hemiparesis following cerebral infarction affecting left non-dominant side: Secondary | ICD-10-CM | POA: Diagnosis not present

## 2015-09-10 DIAGNOSIS — E785 Hyperlipidemia, unspecified: Secondary | ICD-10-CM | POA: Diagnosis not present

## 2015-09-10 HISTORY — DX: Cerebral atherosclerosis: I67.2

## 2015-09-10 HISTORY — DX: Foot drop, left foot: M21.372

## 2015-09-10 HISTORY — DX: Other sequelae of cerebral infarction: R26.9

## 2015-09-10 HISTORY — PX: CYSTOSCOPY WITH BIOPSY: SHX5122

## 2015-09-10 HISTORY — DX: Other specified acquired deformities of left lower leg: M21.862

## 2015-09-10 HISTORY — PX: TRANSURETHRAL RESECTION OF BLADDER TUMOR: SHX2575

## 2015-09-10 HISTORY — DX: Old myocardial infarction: I25.2

## 2015-09-10 HISTORY — DX: Spastic hemiplegia affecting left nondominant side: G81.14

## 2015-09-10 HISTORY — DX: Unspecified sequelae of cerebral infarction: I69.30

## 2015-09-10 HISTORY — DX: Type 2 diabetes mellitus without complications: E11.9

## 2015-09-10 HISTORY — DX: Presence of aortocoronary bypass graft: Z95.1

## 2015-09-10 HISTORY — PX: CYSTOSCOPY WITH URETHRAL DILATATION: SHX5125

## 2015-09-10 HISTORY — PX: CYSTOSCOPY W/ RETROGRADES: SHX1426

## 2015-09-10 HISTORY — DX: Personal history of other diseases of the circulatory system: Z86.79

## 2015-09-10 HISTORY — DX: Neurologic neglect syndrome: R41.4

## 2015-09-10 HISTORY — DX: Other sequelae of cerebral infarction: I69.398

## 2015-09-10 HISTORY — DX: Unspecified hearing loss, unspecified ear: H91.90

## 2015-09-10 HISTORY — DX: Parkinson's disease: G20

## 2015-09-10 HISTORY — DX: Parkinsonism, unspecified: G20.C

## 2015-09-10 HISTORY — DX: History of falling: Z91.81

## 2015-09-10 LAB — POCT I-STAT, CHEM 8
BUN: 21 mg/dL — ABNORMAL HIGH (ref 6–20)
Calcium, Ion: 1.28 mmol/L (ref 1.13–1.30)
Chloride: 105 mmol/L (ref 101–111)
Creatinine, Ser: 1.2 mg/dL (ref 0.61–1.24)
Glucose, Bld: 163 mg/dL — ABNORMAL HIGH (ref 65–99)
HCT: 41 % (ref 39.0–52.0)
Hemoglobin: 13.9 g/dL (ref 13.0–17.0)
Potassium: 4.9 mmol/L (ref 3.5–5.1)
Sodium: 138 mmol/L (ref 135–145)
TCO2: 23 mmol/L (ref 0–100)

## 2015-09-10 LAB — PSA: PSA: 11.84 ng/mL — ABNORMAL HIGH (ref 0.00–4.00)

## 2015-09-10 LAB — GLUCOSE, CAPILLARY: Glucose-Capillary: 138 mg/dL — ABNORMAL HIGH (ref 65–99)

## 2015-09-10 SURGERY — TURBT (TRANSURETHRAL RESECTION OF BLADDER TUMOR)
Anesthesia: General | Site: Urethra

## 2015-09-10 MED ORDER — LACTATED RINGERS IV SOLN
INTRAVENOUS | Status: DC
  Administered 2015-09-10: 09:00:00 via INTRAVENOUS
  Filled 2015-09-10: qty 1000

## 2015-09-10 MED ORDER — KETOROLAC TROMETHAMINE 30 MG/ML IJ SOLN
INTRAMUSCULAR | Status: DC | PRN
  Administered 2015-09-10: 30 mg via INTRAVENOUS

## 2015-09-10 MED ORDER — ACETAMINOPHEN 10 MG/ML IV SOLN
INTRAVENOUS | Status: DC | PRN
  Administered 2015-09-10: 1000 mg via INTRAVENOUS

## 2015-09-10 MED ORDER — PHENAZOPYRIDINE HCL 200 MG PO TABS: 200.0000 mg | ORAL_TABLET | Freq: Three times a day (TID) | ORAL | Status: DC | PRN

## 2015-09-10 MED ORDER — BELLADONNA ALKALOIDS-OPIUM 16.2-60 MG RE SUPP
RECTAL | Status: AC
  Filled 2015-09-10: qty 1

## 2015-09-10 MED ORDER — ONDANSETRON HCL 4 MG/2ML IJ SOLN
INTRAMUSCULAR | Status: DC | PRN
  Administered 2015-09-10: 4 mg via INTRAVENOUS

## 2015-09-10 MED ORDER — ONDANSETRON HCL 4 MG/2ML IJ SOLN
INTRAMUSCULAR | Status: AC
  Filled 2015-09-10: qty 2

## 2015-09-10 MED ORDER — CEFAZOLIN SODIUM-DEXTROSE 2-3 GM-% IV SOLR
2.0000 g | INTRAVENOUS | Status: AC
  Administered 2015-09-10: 2 g via INTRAVENOUS
  Filled 2015-09-10: qty 50

## 2015-09-10 MED ORDER — CEFAZOLIN SODIUM-DEXTROSE 2-3 GM-% IV SOLR
INTRAVENOUS | Status: AC
  Filled 2015-09-10: qty 50

## 2015-09-10 MED ORDER — LACTATED RINGERS IV SOLN
INTRAVENOUS | Status: DC
  Filled 2015-09-10: qty 1000

## 2015-09-10 MED ORDER — MIDAZOLAM HCL 2 MG/2ML IJ SOLN
INTRAMUSCULAR | Status: AC
  Filled 2015-09-10: qty 2

## 2015-09-10 MED ORDER — MIDAZOLAM HCL 5 MG/5ML IJ SOLN
INTRAMUSCULAR | Status: DC | PRN
  Administered 2015-09-10: 1 mg via INTRAVENOUS

## 2015-09-10 MED ORDER — FENTANYL CITRATE (PF) 100 MCG/2ML IJ SOLN
INTRAMUSCULAR | Status: DC | PRN
  Administered 2015-09-10: 50 ug via INTRAVENOUS

## 2015-09-10 MED ORDER — LIDOCAINE HCL (CARDIAC) 20 MG/ML IV SOLN
INTRAVENOUS | Status: DC | PRN
  Administered 2015-09-10: 60 mg via INTRAVENOUS

## 2015-09-10 MED ORDER — ACETAMINOPHEN 10 MG/ML IV SOLN
INTRAVENOUS | Status: AC
  Filled 2015-09-10: qty 100

## 2015-09-10 MED ORDER — BELLADONNA ALKALOIDS-OPIUM 16.2-60 MG RE SUPP
RECTAL | Status: DC | PRN
  Administered 2015-09-10: 1 via RECTAL

## 2015-09-10 MED ORDER — TRAMADOL-ACETAMINOPHEN 37.5-325 MG PO TABS: 1.0000 | ORAL_TABLET | Freq: Four times a day (QID) | ORAL | Status: DC | PRN

## 2015-09-10 MED ORDER — PHENAZOPYRIDINE HCL 200 MG PO TABS
200.0000 mg | ORAL_TABLET | Freq: Once | ORAL | Status: AC
  Administered 2015-09-10: 200 mg via ORAL
  Filled 2015-09-10: qty 1

## 2015-09-10 MED ORDER — PHENAZOPYRIDINE HCL 100 MG PO TABS
ORAL_TABLET | ORAL | Status: AC
  Filled 2015-09-10: qty 2

## 2015-09-10 MED ORDER — FENTANYL CITRATE (PF) 100 MCG/2ML IJ SOLN
25.0000 ug | INTRAMUSCULAR | Status: DC | PRN
  Filled 2015-09-10: qty 1

## 2015-09-10 MED ORDER — DEXAMETHASONE SODIUM PHOSPHATE 10 MG/ML IJ SOLN
INTRAMUSCULAR | Status: AC
  Filled 2015-09-10: qty 1

## 2015-09-10 MED ORDER — SODIUM CHLORIDE 0.9 % IR SOLN
Status: DC | PRN
  Administered 2015-09-10: 6000 mL

## 2015-09-10 MED ORDER — STERILE WATER FOR IRRIGATION IR SOLN
Status: DC | PRN
  Administered 2015-09-10: 3000 mL

## 2015-09-10 MED ORDER — DEXAMETHASONE SODIUM PHOSPHATE 4 MG/ML IJ SOLN
INTRAMUSCULAR | Status: DC | PRN
  Administered 2015-09-10: 10 mg via INTRAVENOUS

## 2015-09-10 MED ORDER — TRIMETHOPRIM 100 MG PO TABS: 100.0000 mg | ORAL_TABLET | ORAL | Status: DC

## 2015-09-10 MED ORDER — GLYCOPYRROLATE 0.2 MG/ML IJ SOLN
INTRAMUSCULAR | Status: AC
  Filled 2015-09-10: qty 1

## 2015-09-10 MED ORDER — KETOROLAC TROMETHAMINE 30 MG/ML IJ SOLN
INTRAMUSCULAR | Status: AC
  Filled 2015-09-10: qty 1

## 2015-09-10 MED ORDER — PROPOFOL 10 MG/ML IV BOLUS
INTRAVENOUS | Status: DC | PRN
  Administered 2015-09-10: 120 mg via INTRAVENOUS

## 2015-09-10 MED ORDER — PHENYLEPHRINE HCL 10 MG/ML IJ SOLN
INTRAMUSCULAR | Status: DC | PRN
  Administered 2015-09-10 (×5): 120 ug via INTRAVENOUS

## 2015-09-10 MED ORDER — PROPOFOL 10 MG/ML IV BOLUS
INTRAVENOUS | Status: AC
  Filled 2015-09-10: qty 20

## 2015-09-10 MED ORDER — IOHEXOL 350 MG/ML SOLN
INTRAVENOUS | Status: DC | PRN
  Administered 2015-09-10: 10 mL

## 2015-09-10 MED ORDER — MITOMYCIN CHEMO FOR BLADDER INSTILLATION 40 MG
40.0000 mg | Freq: Once | INTRAVENOUS | Status: AC
  Administered 2015-09-10: 40 mg via INTRAVESICAL
  Filled 2015-09-10: qty 40

## 2015-09-10 MED ORDER — FENTANYL CITRATE (PF) 100 MCG/2ML IJ SOLN
INTRAMUSCULAR | Status: AC
  Filled 2015-09-10: qty 4

## 2015-09-10 SURGICAL SUPPLY — 36 items
BAG DRAIN URO-CYSTO SKYTR STRL (DRAIN) ×4 IMPLANT
BAG DRN ANRFLXCHMBR STRAP LEK (BAG)
BAG DRN UROCATH (DRAIN) ×2
BAG URINE DRAINAGE (UROLOGICAL SUPPLIES) ×4 IMPLANT
BAG URINE LEG 19OZ MD ST LTX (BAG) IMPLANT
BOOTIES KNEE HIGH SLOAN (MISCELLANEOUS) ×4 IMPLANT
CANISTER SUCT LVC 12 LTR MEDI- (MISCELLANEOUS) IMPLANT
CATH FOLEY 2WAY SLVR  5CC 20FR (CATHETERS) ×1
CATH FOLEY 2WAY SLVR  5CC 22FR (CATHETERS)
CATH FOLEY 2WAY SLVR 5CC 20FR (CATHETERS) ×3 IMPLANT
CATH FOLEY 2WAY SLVR 5CC 22FR (CATHETERS) IMPLANT
CATH URET 5FR 28IN OPEN ENDED (CATHETERS) ×4 IMPLANT
CLOTH BEACON ORANGE TIMEOUT ST (SAFETY) ×4 IMPLANT
ELECT REM PT RETURN 9FT ADLT (ELECTROSURGICAL) ×4
ELECTRODE REM PT RTRN 9FT ADLT (ELECTROSURGICAL) ×3 IMPLANT
GLOVE BIO SURGEON STRL SZ7 (GLOVE) ×4 IMPLANT
GLOVE BIO SURGEON STRL SZ7.5 (GLOVE) ×4 IMPLANT
GLOVE BIOGEL PI IND STRL 7.0 (GLOVE) ×3 IMPLANT
GLOVE BIOGEL PI IND STRL 7.5 (GLOVE) ×3 IMPLANT
GLOVE BIOGEL PI INDICATOR 7.0 (GLOVE) ×1
GLOVE BIOGEL PI INDICATOR 7.5 (GLOVE) ×1
GLOVE SURG SS PI 7.5 STRL IVOR (GLOVE) ×4 IMPLANT
GOWN STRL REUS W/ TWL XL LVL3 (GOWN DISPOSABLE) ×3 IMPLANT
GOWN STRL REUS W/TWL LRG LVL3 (GOWN DISPOSABLE) ×4 IMPLANT
GOWN STRL REUS W/TWL XL LVL3 (GOWN DISPOSABLE) ×11 IMPLANT
GOWN XL W/COTTON TOWEL STD (GOWNS) ×4 IMPLANT
HOLDER FOLEY CATH W/STRAP (MISCELLANEOUS) ×4 IMPLANT
KIT ROOM TURNOVER WOR (KITS) ×4 IMPLANT
LOOP CUT BIPOLAR 24F LRG (ELECTROSURGICAL) ×4 IMPLANT
MANIFOLD NEPTUNE II (INSTRUMENTS) ×4 IMPLANT
PACK CYSTO (CUSTOM PROCEDURE TRAY) ×4 IMPLANT
PLUG CATH AND CAP STER (CATHETERS) IMPLANT
SET ASPIRATION TUBING (TUBING) IMPLANT
SET BERKELEY SUCTION TUBING (SUCTIONS) ×4 IMPLANT
SYRINGE IRR TOOMEY STRL 70CC (SYRINGE) IMPLANT
TUBE CONNECTING 12X1/4 (SUCTIONS) IMPLANT

## 2015-09-10 NOTE — Op Note (Signed)
Pre-operative diagnosis :  Multiple bladder tumors  Postoperative diagnosis: Same  Operation:  Cystourethroscopy, bilateral retrograde pyelogram with interpretation; Tauber excisional biopsy of 2 cm right trigonal superficial TCC; and  Tauber excisional biopsy of right posterior bladder wall bladder tumor, 1 cm; Tauber excisional biopsy of posterior bladder wall bladder tumor, 1 cm satellite lesion; transurethral resection of left posterior bladder wall TCC, 2 cm.  Surgeon:  Chauncey Cruel. Gaynelle Arabian, MD  First assistant:  None  Anesthesia:  Gen. LMA  Preparation:  After appropriate preanesthesia, the patient was brought the operating room, placed on the operating table in the dorsal supine position where general LMA anesthesia was introduced. He was replaced in the dorsal lithotomy position with the pubis was prepped with Betadine solution and draped in usual fashion. The arm band was double checked. The history was double checked. X-rays were double checked. Review history:    69 yo male returns today for cystoscopy & to review CT results. Hx of gross hematuria & he is currently on an anticoagulant. Previous hx of a PSA elevation of 3.83, then 5.28/11.9% noted by Dr. Stephanie Acre. He is no longer using Androgel. PSA 6.61/16% in Oct 2010. In the past, he didn't want to stop his Aggrenox for biopsy, and pharmacist "would not prescribe" Levaquin for him.     11/28/11 PSA - 4.85/11%  04/23/10 Psa 5.07/13%,  01/22/10 Psa 6.06/23.4%  10/11/09 Psa 5.28/11.9%.   Past Medical History Problems  1. History of Acute Myocardial Infarction 2. History of basal cell carcinoma (Z85.828) 3. History of cardiac disorder (Z86.79) 4. History of hyperlipidemia (Z86.39) 5. History of hypertension (Z86.79) 6. History of seizure disorder (Z86.69) 7. History of type 2 diabetes mellitus (Z86.39) 8. History of Pancreatitis 9. History of Stroke syndrome (I63.9) Cystoscopy showed multiple bladder tumors, and pt is now for  TURBT  Statement of  Likelihood of Success: Excellent. TIME-OUT observed.:  Procedure:  Cystourethroscopy was accomplished, after the urethra was dilated to a size 76 [place] with the male sounds. Cystoscopy revealed multiple areas of bladder tumor. The first area identified was on the right lateral trigone. Additional areas were noted in the posterior bladder wall, and areas of yellowish discoloration, indicating that the tumor had previously bled were identified.  Bilateral retrograde pyelogram was performed, showing normal-appearing ureters. There was no evidence of dilation of the ureters area and there was no evidence of bladder stone. There was no evidence of ureteral tumor or renal pelvic tumor.  Attention was directed to the bladder tumors. Using the [facility] biopsy device, hemostatic excisional biopsy was accomplished of the 2 cm right trigonal tumor, and of the right posterior lateral wall tumor (1cm)'s. The posterior bladder wall satellite tumor measuring 1 cm was also biopsied.  Using the loop electrode, the larger volume of TCC was resected from the posterior bladder wall, measuring 2 cm. All tumors were sent to the laboratory individually marked. All bases were electrocoagulated, and there was minimal bleeding noted.  Because all tumor appeared superficial, I elected to place a 20 French 5 mL balloon, and the patient will have mitomycin-C placed in recovery room.  The patient received IV Tylenol and IV Toradol and was awakened and taken to recovery room in good condition.

## 2015-09-10 NOTE — Anesthesia Postprocedure Evaluation (Signed)
Anesthesia Post Note  Patient: [name]  Procedure(s) Performed: Procedure(s) (LRB): CYSTO TRANSURETHRAL RESECTION OF BLADDER TUMOR (TURBT) OF LEFT POSTERIOR BLADDER TUMOR 2 CM (N/A) CYSTOSCOPY WITH RETROGRADE PYELOGRAM (Bilateral) CYSTOSCOPY WITH URETHRAL DILATATION (N/A) CYSTOSCOPY WITH BIOPSY,RIGHT TRIGONAL TUMOR 1.5 CM, EXCISIONAL BIOPSY WITH TAUBER FORCEP RIGHT POSTERIOR BLADDER WALL 1 CM, EXCISIONAL BIOPSY SATELITE BLADDER WALL 1 CM (N/A)  Patient location during evaluation: PACU Anesthesia Type: General Level of consciousness: awake and alert Pain management: pain level controlled Vital Signs Assessment: post-procedure vital signs reviewed and stable Respiratory status: spontaneous breathing, nonlabored ventilation, respiratory function stable and patient connected to nasal cannula oxygen Cardiovascular status: blood pressure returned to baseline and stable Postop Assessment: no signs of nausea or vomiting Anesthetic complications: no    Last Vitals:  Filed Vitals:   09/10/15 1330 09/10/15 1628  BP: 149/68 158/64  Pulse: 58 62  Temp:  36.6 C  Resp: 16 18    Last Pain: There were no vitals filed for this visit.               Adalida Garver L

## 2015-09-10 NOTE — Anesthesia Preprocedure Evaluation (Addendum)
Anesthesia Evaluation  Patient identified by MRN, date of birth, ID band Patient awake    Reviewed: Allergy & Precautions, H&P , NPO status , Patient's Chart, lab work & pertinent test results  Airway Mallampati: II  TM Distance: >3 FB Neck ROM: full    Dental  (+) Dental Advisory Given, Edentulous Upper, Edentulous Lower   Pulmonary neg pulmonary ROS, former smoker,    Pulmonary exam normal breath sounds clear to auscultation       Cardiovascular Exercise Tolerance: Good hypertension, Pt. on medications and Pt. on home beta blockers + CAD, + CABG and + Peripheral Vascular Disease  Normal cardiovascular exam Rhythm:regular Rate:Normal     Neuro/Psych Seizures -, Well Controlled,  Parkinsonism. Carotid stenosis with CEA. Left spastic hemiparesis  Neuromuscular disease CVA, Residual Symptoms negative neurological ROS  negative psych ROS   GI/Hepatic negative GI ROS, Neg liver ROS,   Endo/Other  diabetes, Well Controlled, Type 2  Renal/GU negative Renal ROS  negative genitourinary   Musculoskeletal   Abdominal   Peds  Hematology negative hematology ROS (+)   Anesthesia Other Findings   Reproductive/Obstetrics negative OB ROS                            Anesthesia Physical Anesthesia Plan  ASA: III  Anesthesia Plan: General   Post-op Pain Management:    Induction: Intravenous  Airway Management Planned: LMA  Additional Equipment:   Intra-op Plan:   Post-operative Plan:   Informed Consent: I have reviewed the patients History and Physical, chart, labs and discussed the procedure including the risks, benefits and alternatives for the proposed anesthesia with the patient or authorized representative who has indicated his/her understanding and acceptance.   Dental Advisory Given  Plan Discussed with: CRNA and Surgeon  Anesthesia Plan Comments:         Anesthesia Quick  Evaluation

## 2015-09-10 NOTE — Interval H&P Note (Signed)
History and Physical Interval Note:  09/10/2015 10:17 AM  [name]  has presented today for surgery, with the diagnosis of BLADDER CANCER  The various methods of treatment have been discussed with the patient and family. After consideration of risks, benefits and other options for treatment, the patient has consented to  Procedure(s): CYSTO TRANSURETHRAL RESECTION OF BLADDER TUMOR (TURBT) (N/A) as a surgical intervention .  The patient's history has been reviewed, patient examined, no change in status, stable for surgery.  I have reviewed the patient's chart and labs.  Questions were answered to the patient's satisfaction.   We have discussed the possibility of Mitomycin -C in Recovery Room, if he has superficial tumors, and resection is not deep. Both he and his wife voice understanding.   Zavon Hyson I Aaisha Sliter

## 2015-09-10 NOTE — Discharge Instructions (Addendum)
Bladder Cancer Bladder cancer is an abnormal growth of tissue in your bladder. Your bladder is the balloon-like sac in your pelvis. It collects and stores urine that comes from the kidneys through the ureters. The bladder wall is made of layers. If cancer spreads into these layers and through the wall of the bladder, it becomes more difficult to treat.  There are four stages of bladder cancer:  Stage I. Cancer at this stage occurs in the bladder's inner lining but has not invaded the muscular bladder wall.  Stage II. At this stage, cancer has invaded the bladder wall but is still confined to the bladder.  Stage III. By this stage, the cancer cells have spread through the bladder wall to surrounding tissue. They may also have spread to the prostate in men or the uterus or vagina in women.  Stage IV. By this stage, cancer cells may have spread to the lymph nodes and other organs, such as your lungs, bones, or liver. RISK FACTORS Although the cause of bladder cancer is not known, the following risk factors can increase your chances of getting bladder cancer:  1. Smoking.  2. Occupational exposures, such as rubber, leather, textile, dyes, chemicals, and paint. 3. Being white. 4. Age.  2. Being male.  6. Having chronic bladder inflammation.  7. Having a bladder cancer history.  8. Having a family history of bladder cancer (heredity).  9. Having had chemotherapy or radiation therapy to the pelvis.  71. Being exposed to arsenic.  SYMPTOMS  1. Blood in the urine.  2. Pain with urination.  3. Frequent bladder or urine infections. 4. Increase in urgency and frequency of urination. DIAGNOSIS  Your health care provider may suspect bladder cancer based on your description of urinary symptoms or based on the finding of blood or infection in the urine (especially if this has recurred several times). Other tests or procedures that may be performed include:  1. A narrow tube being inserted  into your bladder through your urethra (cystoscopy) in order to view the lining of your bladder for tumors.  2. A biopsy to sample the tumor to see if cancer is present.  If cancer is present, it will then be staged to determine its severity and extent. It is important to know how deeply into the bladder wall the cancer has grown and whether the cancer has spread to any other parts of your body. Staging may require blood tests or special scans such as a CT scan, MRI, bone scan, or chest X-ray.  TREATMENT  Once your cancer has been diagnosed and staged, you should discuss a treatment plan with your health care provider. Based on the stage of the cancer, one treatment or a combination of treatments may be recommended. The most common forms of treatment are:  1. Surgery. Procedures that may be done include transurethral resection and cystectomy. 2. Radiation therapy. This is infrequently used to treat bladder cancer.  3. Chemotherapy. During this treatment, drugs are used to kill cancer cells. 4. Immunotherapy. This is usually administered directly into the bladder. HOME CARE INSTRUCTIONS  Take medicines only as directed by your health care provider.   Maintain a healthy diet.   Consider joining a support group. This may help you learn to cope with the stress of having bladder cancer.   Seek advice to help you manage treatment side effects.   Keep all follow-up visits as directed by your health care provider.   Inform your cancer specialist if you are  admitted to the hospital.  [place] IF:  There is blood in your urine.  You have symptoms of a urinary tract infection. These include:  Tiredness.  Shakiness.  Weakness.  Muscle aches.  Abdominal pain.  Frequent and intense urge to urinate (in young women).  Burning feeling in the bladder or urethra during urination (in young women). SEEK IMMEDIATE MEDICAL CARE IF:  You are unable to urinate.   This  information is not intended to replace advice given to you by your health care provider. Make sure you discuss any questions you have with your health care provider.     HOME CARE INSTRUCTIONS  Activity: Rest for the remainder of the day.  Do not drive or operate equipment today.  You may resume normal activities in one to two days as instructed by your physician.   Meals: Drink plenty of liquids and eat light foods such as gelatin or soup this evening.  You may return to a normal meal plan tomorrow.  Return to Work: You may return to work in one to two days or as instructed by your physician.  Special Instructions / Symptoms: Call your physician if any of these symptoms occur:   -persistent or heavy bleeding  -bleeding which continues after first few urination  -large blood clots that are difficult to pass  -urine stream diminishes or stops completely  -fever equal to or higher than 101 degrees Farenheit.  -cloudy urine with a strong, foul odor  -severe pain  Females should always wipe from front to back after elimination.  You may feel some burning pain when you urinate.  This should disappear with time.  Applying moist heat to the lower abdomen or a hot tub bath may help relieve the pain.   Follow-Up / Date of Return Visit to Your Physician: Call for an appointment to arrange follow-up.     Post Anesthesia Home Care Instructions  Activity: Get plenty of rest for the remainder of the day. A responsible adult should stay with you for 24 hours following the procedure.  For the next 24 hours, DO NOT: -Drive a car -Paediatric nurse -Drink alcoholic beverages -Take any medication unless instructed by your physician -Make any legal decisions or sign important papers.  Meals: Start with liquid foods such as gelatin or soup. Progress to regular foods as tolerated. Avoid greasy, spicy, heavy foods. If nausea and/or vomiting occur, drink only clear liquids until the nausea  and/or vomiting subsides. Call your physician if vomiting continues.  Special Instructions/Symptoms: Your throat may feel dry or sore from the anesthesia or the breathing tube placed in your throat during surgery. If this causes discomfort, gargle with warm salt water. The discomfort should disappear within 24 hours.  If you had a scopolamine patch placed behind your ear for the management of post- operative nausea and/or vomiting:  1. The medication in the patch is effective for 72 hours, after which it should be removed.  Wrap patch in a tissue and discard in the trash. Wash hands thoroughly with soap and water. 2. You may remove the patch earlier than 72 hours if you experience unpleasant side effects which may include dry mouth, dizziness or visual disturbances. 3. Avoid touching the patch. Wash your hands with soap and water after contact with the patch.     Foley Catheter Care, Adult A Foley catheter is a soft, flexible tube that is placed into the bladder to drain urine. A Foley catheter may be inserted if:  You leak urine or are not able to control when you urinate (urinary incontinence).  You are not able to urinate when you need to (urinary retention).  You had prostate surgery or surgery on the genitals.  You have certain medical conditions, such as multiple sclerosis, dementia, or a spinal cord injury. If you are going home with a Foley catheter in place, follow the instructions below. TAKING CARE OF THE CATHETER 11. Wash your hands with soap and water. 12. Using mild soap and warm water on a clean washcloth:  Clean the area on your body closest to the catheter insertion site using a circular motion, moving away from the catheter. Never wipe toward the catheter because this could sweep bacteria up into the urethra and cause infection.  Remove all traces of soap. Pat the area dry with a clean towel. For males, reposition the foreskin. 34. Attach the catheter to your leg so  there is no tension on the catheter. Use adhesive tape or a leg strap. If you are using adhesive tape, remove any sticky residue left behind by the previous tape you used. 14. Keep the drainage bag below the level of the bladder, but keep it off the floor. 15. Check throughout the day to be sure the catheter is working and urine is draining freely. Make sure the tubing does not become kinked. 16. Do not pull on the catheter or try to remove it. Pulling could damage internal tissues. TAKING CARE OF THE DRAINAGE BAGS You will be given two drainage bags to take home. One is a large overnight drainage bag, and the other is a smaller leg bag that fits underneath clothing. You may wear the overnight bag at any time, but you should never wear the smaller leg bag at night. Follow the instructions below for how to empty, change, and clean your drainage bags. Emptying the Drainage Bag You must empty your drainage bag when it is  - full or at least 2-3 times a day. 5. Wash your hands with soap and water. 6. Keep the drainage bag below your hips, below the level of your bladder. This stops urine from going back into the tubing and into your bladder. 7. Hold the dirty bag over the toilet or a clean container. 8. Open the pour spout at the bottom of the bag and empty the urine into the toilet or container. Do not let the pour spout touch the toilet, container, or any other surface. Doing so can place bacteria on the bag, which can cause an infection. 9. Clean the pour spout with a gauze pad or cotton ball that has rubbing alcohol on it. 10. Close the pour spout. 11. Attach the bag to your leg with adhesive tape or a leg strap. 12. Wash your hands well. Changing the Drainage Bag Change your drainage bag once a month or sooner if it starts to smell bad or look dirty. Below are steps to follow when changing the drainage bag. 3. Wash your hands with soap and water. 4. Pinch off the rubber catheter so that urine  does not spill out. 5. Disconnect the catheter tube from the drainage tube at the connection valve. Do not let the tubes touch any surface. 6. Clean the end of the catheter tube with an alcohol wipe. Use a different alcohol wipe to clean the end of the drainage tube. 7. Connect the catheter tube to the drainage tube of the clean drainage bag. 8. Attach the new bag to the leg  with adhesive tape or a leg strap. Avoid attaching the new bag too tightly. 9. Wash your hands well. Cleaning the Drainage Bag 5. Wash your hands with soap and water. 6. Wash the bag in warm, soapy water. 7. Rinse the bag thoroughly with warm water. 8. Fill the bag with a solution of white vinegar and water (1 cup vinegar to 1 qt warm water [.2 L vinegar to 1 L warm water]). Close the bag and soak it for 30 minutes in the solution. 9. Rinse the bag with warm water. 10. Hang the bag to dry with the pour spout open and hanging downward. 11. Store the clean bag (once it is dry) in a clean plastic bag. 12. Wash your hands well. PREVENTING INFECTION  Wash your hands before and after handling your catheter.  Take showers daily and wash the area where the catheter enters your body. Do not take baths. Replace wet leg straps with dry ones, if this applies.  Do not use powders, sprays, or lotions on the genital area. Only use creams, lotions, or ointments as directed by your caregiver.  For females, wipe from front to back after each bowel movement.  Drink enough fluids to keep your urine clear or pale yellow unless you have a fluid restriction.  Do not let the drainage bag or tubing touch or lie on the floor.  Wear cotton underwear to absorb moisture and to keep your skin drier. SEEK MEDICAL CARE IF:   Your urine is cloudy or smells unusually bad.  Your catheter becomes clogged.  You are not draining urine into the bag or your bladder feels full.  Your catheter starts to leak. SEEK IMMEDIATE MEDICAL CARE IF:   You  have pain, swelling, redness, or pus where the catheter enters the body.  You have pain in the abdomen, legs, lower back, or bladder.  You have a fever.  You see blood fill the catheter, or your urine is pink or red.  You have nausea, vomiting, or chills.  Your catheter gets pulled out. MAKE SURE YOU:   Understand these instructions.  Will watch your condition.  Will get help right away if you are not doing well or get worse.   This information is not intended to replace advice given to you by your health care provider. Make sure you discuss any questions you have with your health care provider.   Document Released: 09/29/2005 Document Revised: 02/13/2014 Document Reviewed: 09/20/2012 Elsevier Interactive Patient Education [employer].

## 2015-09-10 NOTE — Anesthesia Procedure Notes (Signed)
Procedure Name: LMA Insertion Date/Time: 09/10/2015 10:42 AM Performed by: Wanita Chamberlain Pre-anesthesia Checklist: Patient identified, Timeout performed, Emergency Drugs available, Suction available and Patient being monitored Patient Re-evaluated:Patient Re-evaluated prior to inductionOxygen Delivery Method: Circle system utilized Preoxygenation: Pre-oxygenation with 100% oxygen Intubation Type: IV induction Ventilation: Mask ventilation without difficulty LMA: LMA inserted LMA Size: 4.0 Number of attempts: 1 Placement Confirmation: positive ETCO2 and breath sounds checked- equal and bilateral Tube secured with: Tape Dental Injury: Teeth and Oropharynx as per pre-operative assessment

## 2015-09-10 NOTE — Transfer of Care (Signed)
Immediate Anesthesia Transfer of Care Note  Patient: [name]  Procedure(s) Performed: Procedure(s): CYSTO TRANSURETHRAL RESECTION OF BLADDER TUMOR (TURBT) OF LEFT POSTERIOR BLADDER TUMOR 2 CM (N/A) CYSTOSCOPY WITH RETROGRADE PYELOGRAM (Bilateral) CYSTOSCOPY WITH URETHRAL DILATATION (N/A) CYSTOSCOPY WITH BIOPSY,RIGHT TRIGONAL TUMOR 1.5 CM, EXCISIONAL BIOPSY WITH TAUBER FORCEP RIGHT POSTERIOR BLADDER WALL 1 CM, EXCISIONAL BIOPSY SATELITE BLADDER WALL 1 CM (N/A)  Patient Location: PACU  Anesthesia Type:General  Level of Consciousness: awake, alert , oriented and patient cooperative  Airway & Oxygen Therapy: Patient Spontanous Breathing and Patient connected to nasal cannula oxygen  Post-op Assessment: Report given to RN and Post -op Vital signs reviewed and stable  Post vital signs: Reviewed and stable  Last Vitals:  Filed Vitals:   09/10/15 0905  BP: 138/72  Pulse: 63  Temp: 36.9 C  Resp: 18    Complications: No apparent anesthesia complications

## 2015-09-11 ENCOUNTER — Encounter (HOSPITAL_BASED_OUTPATIENT_CLINIC_OR_DEPARTMENT_OTHER): Payer: Self-pay | Admitting: Urology

## 2015-09-11 NOTE — Progress Notes (Signed)
Late entry - Verbal order given to this RN by Dr Carolan Clines to place Foley catheter if patient unable to void sufficiently post operatively on 09/10/2015

## 2015-09-12 ENCOUNTER — Ambulatory Visit: Payer: Medicare Other | Admitting: Physical Medicine & Rehabilitation

## 2015-09-25 ENCOUNTER — Encounter: Payer: Self-pay | Admitting: Cardiovascular Disease

## 2015-09-25 ENCOUNTER — Ambulatory Visit (INDEPENDENT_AMBULATORY_CARE_PROVIDER_SITE_OTHER): Payer: Medicare Other | Admitting: Cardiovascular Disease

## 2015-09-25 VITALS — HR 69 | Ht 67.0 in | Wt 181.0 lb

## 2015-09-25 DIAGNOSIS — I2510 Atherosclerotic heart disease of native coronary artery without angina pectoris: Secondary | ICD-10-CM

## 2015-09-25 DIAGNOSIS — I10 Essential (primary) hypertension: Secondary | ICD-10-CM | POA: Insufficient documentation

## 2015-09-25 DIAGNOSIS — E785 Hyperlipidemia, unspecified: Secondary | ICD-10-CM | POA: Diagnosis not present

## 2015-09-25 DIAGNOSIS — I2583 Coronary atherosclerosis due to lipid rich plaque: Secondary | ICD-10-CM

## 2015-09-25 NOTE — Assessment & Plan Note (Addendum)
History of hypertension blood pressure measures 114/64. He is on Benicar and Lopressor. Continue current meds at current dosing

## 2015-09-25 NOTE — Assessment & Plan Note (Signed)
History of carotid artery disease status post bilateral carotid endarterectomies back in 1999 with subsequent stroke causing left hemi-tarsus. He's had bilateral carotid stenting by myself and Dr. Trula Slade performed in a staged fashion back in 2012.

## 2015-09-25 NOTE — Assessment & Plan Note (Signed)
History of coronary artery disease status post bypass grafting by Dr. Roxan Hockey in 2004. Myoview performed 01/09/11 was nonischemic. He denies chest pain or shortness of breath.

## 2015-09-25 NOTE — Patient Instructions (Signed)

## 2015-09-25 NOTE — Assessment & Plan Note (Signed)
History of hyperlipidemia on Crestor 20 mg a day with recent lipid profile performed 04/02/15 revealing total cholesterol 143, LDL 69 and HDL of 33

## 2015-09-25 NOTE — Progress Notes (Signed)
09/25/2015 [name]   1946-04-24  [id number]  Primary Physician Lilian Coma, MD Primary Cardiologist: Lorretta Harp MD Renae Gloss   HPI:  Mr. Pelz is a 69 year old mildly overweight but was Caucasian male father of 4 children accompanied by his first ex-wife who is his caregiver. I last saw him in the office 04/03/15. His primary care physician is Dr. [name]. He he is a retired Administrator. His cardiac risk factor profile was positive for 75 pack years of tobacco smoking having quit in 2009. He is treated hyperlipidemia. He did have bilateral carotid endarterectomies back in 1999 a sister with a stroke that caused left hemiparesis and paresthesias. He had bilateral carotid stenting performed by Dr. Trula Slade and myself in a staged fashion because of restenosis back in 2012. He also clearly has Parkinson's disease which has been progressive over the last year now requiring him to ambulate with the aid of a walker.. He had coronary bypass grafting in 2004 by Dr. Merilynn Finland and a Myoview stress test performed 01/09/11 was nonischemic. He denies chest pain or shortness of breath. Since I saw him 6 months ago he's been diagnosed with bladder cancer and has had surgery by Dr. Gaynelle Arabian.   Current Outpatient Prescriptions  Medication Sig Dispense Refill  . AGGRENOX 25-200 MG per 12 hr capsule TAKE 1 CAPSULE BY MOUTH 2 (TWO) TIMES DAILY. 180 capsule 4  . BENICAR 20 MG tablet TAKE 1 TABLET BY MOUTH DAILY (Patient taking differently: TAKE 1 TABLET BY MOUTH DAILY---  takes in pm) 90 tablet 2  . carbidopa-levodopa (SINEMET IR) 25-100 MG per tablet Take 0.5-1 tablets by mouth 3 (three) times daily. (Patient taking differently: Take 1 tablet by mouth 3 (three) times daily. ) 90 tablet 12  . Coenzyme Q10 (COQ-10) 50 MG CAPS Take 1 capsule by mouth 2 (two) times daily.    . dantrolene (DANTRIUM) 50 MG capsule TAKE 1 CAPSULE BY MOUTH TWICE DAILY 180 capsule 1  .  DILANTIN 100 MG ER capsule TAKE 1 CAPSULE (100 MG TOTAL) BY MOUTH 3 (THREE) TIMES DAILY. 270 capsule 4  . fenofibrate (TRICOR) 48 MG tablet TAKE 1 TABLET BY MOUTH DAILY (Patient taking differently: TAKE 1 TABLET BY MOUTH DAILY-- takes in pm) 90 tablet 3  . Flaxseed, Linseed, 1000 MG CAPS Take 1 capsule by mouth every evening.     Marland Kitchen FOLBIC 2.5-25-2 MG TABS TAKE 1 TABLET BY MOUTH DAILY. (Patient taking differently: TAKE 1 TABLET BY MOUTH DAILY.  takes in pm) 90 tablet 4  . KEPPRA 500 MG tablet TAKE 1 TABLET BY MOUTH TWICE DAILY 180 tablet 4  . Magnesium 250 MG TABS Take 1 tablet by mouth every evening.     . metoprolol tartrate (LOPRESSOR) 25 MG tablet TAKE 1 TABLET (25 MG TOTAL) BY MOUTH DAILY. 90 tablet 1  . nitroGLYCERIN (NITROSTAT) 0.4 MG SL tablet Place 1 tablet (0.4 mg total) under the tongue every 5 (five) minutes as needed for chest pain. 25 tablet 3  . phenazopyridine (PYRIDIUM) 200 MG tablet Take 1 tablet (200 mg total) by mouth 3 (three) times daily as needed for pain. 10 tablet 0  . rosuvastatin (CRESTOR) 20 MG tablet Take 20 mg by mouth daily.    . vitamin C (ASCORBIC ACID) 500 MG tablet Take 500 mg by mouth every evening.     Marland Kitchen ZETIA 10 MG tablet TAKE 1 TABLET (10 MG TOTAL) BY MOUTH DAILY. (Patient taking differently: TAKE 1 TABLET (  10 MG TOTAL) BY MOUTH DAILY.---  takes in pm) 90 tablet 3   No current facility-administered medications for this visit.    Allergies  Allergen Reactions  . Altace [Ramipril] Cough    Social History   Social History  . Marital Status: Divorced    Spouse Name: N/A  . Number of Children: 4  . Years of Education: HS   Occupational History  .     Social History Main Topics  . Smoking status: Former Smoker -- 5 years    Types: Cigarettes    Quit date: 11/14/2007  . Smokeless tobacco: Never Used  . Alcohol Use: No  . Drug Use: No  . Sexual Activity: Not on file   Other Topics Concern  . Not on file   Social History Narrative   Patient  lives at home with his caregiver/ex-wife.   Caffeine- 2 cups of coffee daily     Review of Systems: General: negative for chills, fever, night sweats or weight changes.  Cardiovascular: negative for chest pain, dyspnea on exertion, edema, orthopnea, palpitations, paroxysmal nocturnal dyspnea or shortness of breath Dermatological: negative for rash Respiratory: negative for cough or wheezing Urologic: negative for hematuria Abdominal: negative for nausea, vomiting, diarrhea, bright red blood per rectum, melena, or hematemesis Neurologic: negative for visual changes, syncope, or dizziness All other systems reviewed and are otherwise negative except as noted above.    Pulse 69, height 5\' 7"  (1.702 m), weight 181 lb (82.101 kg).  General appearance: alert and no distress Neck: no adenopathy, no carotid bruit, no JVD, supple, symmetrical, trachea midline and thyroid not enlarged, symmetric, no tenderness/mass/nodules Lungs: clear to auscultation bilaterally Heart: regular rate and rhythm, S1, S2 normal, no murmur, click, rub or gallop Extremities: extremities normal, atraumatic, no cyanosis or edema  EKG not performed today  ASSESSMENT AND PLAN:   Hyperlipidemia History of hyperlipidemia on Crestor 20 mg a day with recent lipid profile performed 04/02/15 revealing total cholesterol 143, LDL 69 and HDL of 33  Coronary artery disease History of coronary artery disease status post bypass grafting by Dr. Roxan Hockey in 2004. Myoview performed 01/09/11 was nonischemic. He denies chest pain or shortness of breath.  Essential hypertension History of hypertension blood pressure measures 114/64. He is on Benicar and Lopressor. Continue current meds at current dosing  Occlusion and stenosis of carotid artery without mention of cerebral infarction History of carotid artery disease status post bilateral carotid endarterectomies back in 1999 with subsequent stroke causing left hemi-tarsus. He's had  bilateral carotid stenting by myself and Dr. Trula Slade performed in a staged fashion back in 2012.      Lorretta Harp MD FACP,FACC,FAHA, [facility] 09/25/2015 2:42 PM

## 2015-10-01 ENCOUNTER — Other Ambulatory Visit: Payer: Self-pay | Admitting: Diagnostic Neuroimaging

## 2015-10-09 ENCOUNTER — Other Ambulatory Visit: Payer: Self-pay | Admitting: Physical Medicine & Rehabilitation

## 2015-10-11 ENCOUNTER — Other Ambulatory Visit: Payer: Self-pay | Admitting: Urology

## 2015-10-18 ENCOUNTER — Telehealth: Payer: Self-pay | Admitting: Diagnostic Neuroimaging

## 2015-10-18 MED ORDER — CARBIDOPA-LEVODOPA 25-100 MG PO TABS: 1.0000 | ORAL_TABLET | Freq: Three times a day (TID) | ORAL | Status: DC

## 2015-10-18 NOTE — Telephone Encounter (Signed)
[employer] called to get ok to increase number of tablets for carbidopa-levodopa (SINEMET IR) 25-100 MG tablet to 270 from 90. Pt will have a better copay with the change. [phone].

## 2015-10-18 NOTE — Telephone Encounter (Signed)
Rx updated and provided to pharmacy.

## 2015-10-22 ENCOUNTER — Ambulatory Visit: Payer: Medicare Other | Admitting: Diagnostic Neuroimaging

## 2015-10-23 ENCOUNTER — Encounter: Payer: Self-pay | Admitting: Diagnostic Neuroimaging

## 2015-10-23 ENCOUNTER — Ambulatory Visit (INDEPENDENT_AMBULATORY_CARE_PROVIDER_SITE_OTHER): Payer: Medicare Other | Admitting: Diagnostic Neuroimaging

## 2015-10-23 VITALS — BP 169/88 | HR 64 | Wt 180.6 lb

## 2015-10-23 DIAGNOSIS — I6930 Unspecified sequelae of cerebral infarction: Secondary | ICD-10-CM

## 2015-10-23 DIAGNOSIS — G40909 Epilepsy, unspecified, not intractable, without status epilepticus: Secondary | ICD-10-CM | POA: Diagnosis not present

## 2015-10-23 DIAGNOSIS — Z8673 Personal history of transient ischemic attack (TIA), and cerebral infarction without residual deficits: Secondary | ICD-10-CM | POA: Diagnosis not present

## 2015-10-23 DIAGNOSIS — G20 Parkinson's disease: Secondary | ICD-10-CM

## 2015-10-23 MED ORDER — CARBIDOPA-LEVODOPA 25-100 MG PO TABS: 1.0000 | ORAL_TABLET | Freq: Three times a day (TID) | ORAL | Status: DC

## 2015-10-23 MED ORDER — DILANTIN 100 MG PO CAPS: 100.0000 mg | ORAL_CAPSULE | Freq: Three times a day (TID) | ORAL | Status: DC

## 2015-10-23 MED ORDER — KEPPRA 500 MG PO TABS: 500.0000 mg | ORAL_TABLET | Freq: Two times a day (BID) | ORAL | Status: DC

## 2015-10-23 NOTE — Progress Notes (Signed)
[facility]  PATIENT: [name] DOB: 1946/03/06  REFERRING CLINICIAN:  HISTORY FROM: patient and wife REASON FOR VISIT: follow up   HISTORICAL  CHIEF COMPLAINT:  Chief Complaint  Patient presents with  . Chronic ischemic right MCA stroke    rm , caregiver- Jaquelyne  . Follow-up    6 month    HISTORY OF PRESENT ILLNESS:   UPDATE 10/23/15: Since last visit, overall stable; but unfortunately dx'd with bladder CA, s/p resection. Stable on meds. Tremor stable.   UPDATE 04/19/15: Since last visit, doing better. Less choking on saliva. Postural and action tremor stable. Balance stable. Mainly with postural and action tremor.   UPDATE 12/18/14: Since last visit, stable. On carb/levo half tab TID. May have had some improvement in tremor. Still using single point cane. No falls since last visit.   UPDATE 09/13/14: Since last visit, symptoms are stable. Still falling. More problems due to right sided slowness and chronic left hemiparesis. Asking about parkinson's dz meds, diagnosis, treatment, supplements.   UPDATE 03/08/14: Since last visit, no seizures. Has been falling more. Has noticed hand writing deterioration over past 3 years. Also intermittent right hand tremor x 3-6 months (per wife). Patient noted some right hand tremor even in 1999.  UPDATE 03/08/13: Since last visit patient doing well. No seizures since 2005. Patient is stable on Dilantin and Keppra brand name medications. Patient's swallowing has improved since last visit. He make sure to take liquids following each mouthful of food.  PRIOR HPI (08/27/12, Dr. Erling Cruz): 70 year old right-handed white divorced male from [place], [place] who is cared for by his ex-first wife. He has a known history of hypertension, right MCA stroke 3/99 associated with residual left hemiparesis, acute right MCA stroke 09/2003,coronary artery disease status post 3 vessel bypass in 2004, bilateral carotid  endarterectomies in 1999 and bilateral stent placement 5/12, recurrent TIAs versus seizures, documented seizure 11/12/2006, and Dilantin toxicity. His last MRI of the brain without contrast 11/13/2006 showed a large chronic right middle artery infarct involving the posterior temporal and parietal lobe and right thalamus. MR angiogram of the head without contrast showed intracranial atherosclerotic disease and occlusion of the right middle cerebral artery branch corresponding to the right MCA infarct. There was mild atherosclerotic vascular disease elsewhere and no aneurysm. Doppler study of the carotids 3/5/009 was negative for hemodynamically significant stenosis in the intracranial arteries.  EEG 11/13/2006 showed rhythmic discharges correlating with the EKG and were thought to be artifact. . His last seizure was 11/18/2006. He subsequently had one episode of confusion 11/26/2009 which might have been a seizure. He was at his son's house, came home, looked in the  driveway, and asked where his car was. His car had been given to someone else in the family. He kept asking about the car and recognized that he was confused. The episode lasted 40 minutes. The patient lives with his caretaker. He is able to bathe himself and  take care of his toileting needs, but requires assistance in dressing. He  exercises at the [facility] 2 days per week. He has falls with a fall assessment tool score of 10. He denies macropsia, micropsia, strange odors or tastes. His caretaker reports that at times he has left knee hyperextension. He uses a left leg brace. He denies TIA symptoms or warnings of seizures.He has finished 2 months of PT in no rehabilitation and has a new left foot brace, which is not associated with hyperextension of his knee. He  denies spasticity or spasms. He enjoys playing "We" 2 times per day for exercise.His wife reports that he chokes on his saliva. He has not had  unexplained fever or chills. Last trough levetiracetam level  15.9  and phenytoin level 13.4 on 03/11/2012.  REVIEW OF SYSTEMS: Full 14 system review of systems performed and notable only for tremors.   ALLERGIES: Allergies  Allergen Reactions  . Altace [Ramipril] Cough    HOME MEDICATIONS: Outpatient Prescriptions Prior to Visit  Medication Sig Dispense Refill  . AGGRENOX 25-200 MG per 12 hr capsule TAKE 1 CAPSULE BY MOUTH 2 (TWO) TIMES DAILY. 180 capsule 4  . BENICAR 20 MG tablet TAKE 1 TABLET BY MOUTH DAILY (Patient taking differently: TAKE 1 TABLET BY MOUTH DAILY---  takes in pm) 90 tablet 2  . Coenzyme Q10 (COQ-10) 50 MG CAPS Take 1 capsule by mouth 2 (two) times daily.    . dantrolene (DANTRIUM) 50 MG capsule TAKE 1 CAPSULE BY MOUTH TWICE DAILY 180 capsule 0  . fenofibrate (TRICOR) 48 MG tablet TAKE 1 TABLET BY MOUTH DAILY (Patient taking differently: TAKE 1 TABLET BY MOUTH DAILY-- takes in pm) 90 tablet 3  . Flaxseed, Linseed, 1000 MG CAPS Take 1 capsule by mouth every evening.     Marland Kitchen FOLBIC 2.5-25-2 MG TABS TAKE 1 TABLET BY MOUTH DAILY. (Patient taking differently: TAKE 1 TABLET BY MOUTH DAILY.  takes in pm) 90 tablet 4  . Magnesium 250 MG TABS Take 1 tablet by mouth every evening.     . metoprolol tartrate (LOPRESSOR) 25 MG tablet TAKE 1 TABLET (25 MG TOTAL) BY MOUTH DAILY. 90 tablet 1  . nitroGLYCERIN (NITROSTAT) 0.4 MG SL tablet Place 1 tablet (0.4 mg total) under the tongue every 5 (five) minutes as needed for chest pain. 25 tablet 3  . rosuvastatin (CRESTOR) 20 MG tablet Take 20 mg by mouth daily.    . vitamin C (ASCORBIC ACID) 500 MG tablet Take 500 mg by mouth every evening.     Marland Kitchen ZETIA 10 MG tablet TAKE 1 TABLET (10 MG TOTAL) BY MOUTH DAILY. (Patient taking differently: TAKE 1 TABLET (10 MG TOTAL) BY MOUTH DAILY.---  takes in pm) 90 tablet 3  . carbidopa-levodopa (SINEMET IR) 25-100 MG tablet Take 1 tablet by mouth 3 (three) times daily. 270 tablet 0  . DILANTIN 100 MG ER capsule TAKE 1 CAPSULE (100 MG TOTAL) BY MOUTH 3 (THREE) TIMES  DAILY. 270 capsule 4  . KEPPRA 500 MG tablet TAKE 1 TABLET BY MOUTH TWICE DAILY 180 tablet 4  . phenazopyridine (PYRIDIUM) 200 MG tablet Take 1 tablet (200 mg total) by mouth 3 (three) times daily as needed for pain. (Patient not taking: Reported on 10/23/2015) 10 tablet 0   No facility-administered medications prior to visit.    PAST MEDICAL HISTORY: Past Medical History  Diagnosis Date  . Hyperlipidemia   . Hypertension   . Seizure disorder [facility]) last seizure 2011 due to confusion    neurologist-  dr Leta Baptist-- per note seizure documented 2008 breakthrough partial complex seizure (prior tonic-clonic seizure's post CVA)  . Pancreatic pseudocyst   . CAD (coronary artery disease) cardiologist-  dr berry  . History of CVA with residual deficit neurologist-  dr Leta Baptist    3/ 1999  Right MCA  and  12/ 2004  Anterior division of  Right MCA ---  residual left spactic hemiparesis and left foot drop  . History of MI (myocardial infarction)     02/ 2004  s/p  cabg   . S/P CABG x 4     02/ 2004  . Left spastic hemiparesis ([place])     residual CVA 1999  . Left foot drop     residual from CVA  -- wears brace  . Hemiparkinsonism [facility]) neurologist-- dr Leta Baptist    right side body  . At high risk for falls   . Hyperextension deformity of left knee     wears brace  . HOH (hard of hearing)     LEFT EAR  POST cva  . Visual neglect     LEFT EYE  . Type 2 diabetes, diet controlled ([place])   . Gait disturbance, post-stroke     uses cane, rollator, and w/c long distance  . History of carotid artery stenosis cardiologist-- dr berry    bilateral --  1999 s/p  bilateral ICA endarterectomy and recurrent restenosis 2012  s/p  staged bilateral stenting   per last duplex 2015  stents widely patent  . Peripheral arterial disease ([place])   . Cerebrovascular arteriosclerosis   . Bladder cancer ([place])   . Skin cancer     basal cell, nose    PAST SURGICAL HISTORY: Past Surgical History  Procedure  Laterality Date  . Cardiac catheterization  11/17/2002    significant left main disease and 3 vessel CAD, mildly depressed LV systolic  fx, 123456 left renal artery stenosis  . Carotid endarterectomy  12/1997   [facility])    Bilateral ICA  . US echocardiography  01/03/2011    normal LVF, ef>55%/  mild LAE/ trace MR and TR,  mild AV sclerosis without stenosis  . Nm myocar perf wall motion  01/09/2011   dr berry    mild to mod. perfusion defect in the basal inferolateral & mid inferolaterl regions consistant with an infarct/scar, no signigicant ischemia demonstracted/  Low Risk scan, no sig. change from previous study/  normal LV function and wall motion , ef 63%  . Carotid stent insertion Bilateral dr berry and dr Kristine Royal--  (In-stent Restenosis)  LeftICA   02-12-2011;  RightICA   02-26-2011  . Coronary artery bypass graft  2/ 2004   dr hendrickson    x3 LIMA to LAD,free right internal mammary artery to obtuse marginal 1,SVG to distal right coronary.  . Orif right ankle fx  1998    hardware retained  . Laparoscopic cholecystectomy  1998  . Transurethral resection of bladder tumor N/A 09/10/2015    Procedure: CYSTO TRANSURETHRAL RESECTION OF BLADDER TUMOR (TURBT) OF LEFT POSTERIOR BLADDER TUMOR 2 CM;  Surgeon: Carolan Clines, MD;  [facility];  Service: Urology;  Laterality: N/A;  . Cystoscopy w/ retrogrades Bilateral 09/10/2015    Procedure: CYSTOSCOPY WITH RETROGRADE PYELOGRAM;  Surgeon: Carolan Clines, MD;  Location: [place];  Service: Urology;  Laterality: Bilateral;  . Cystoscopy with urethral dilatation N/A 09/10/2015    Procedure: CYSTOSCOPY WITH URETHRAL DILATATION;  Surgeon: Carolan Clines, MD;  Location[facility];  Service: Urology;  Laterality: N/A;  . Cystoscopy with biopsy N/A 09/10/2015    Procedure: CYSTOSCOPY WITH BIOPSY,RIGHT TRIGONAL TUMOR 1.5 CM, EXCISIONAL BIOPSY WITH TAUBER FORCEP RIGHT POSTERIOR  BLADDER WALL 1 CM, EXCISIONAL BIOPSY SATELITE BLADDER WALL 1 CM;  Surgeon: Carolan Clines, MD;  Location: [facility];  Service: Urology;  Laterality: N/A;  . Mohs surgery      side of nose    FAMILY HISTORY: Family History  Problem Relation Age of Onset  . Stroke Father   . Heart attack Father   . Diabetes Brother   . Hyperlipidemia Brother   . Hypertension Brother   . Hyperlipidemia Sister   . Hypertension Sister   . Stroke Mother     SOCIAL HISTORY:  Social History   Social History  . Marital Status: Divorced    Spouse Name: N/A  . Number of Children: 4  . Years of Education: HS   Occupational History  .     Social History Main Topics  . Smoking status: Former Smoker -- 5 years    Types: Cigarettes    Quit date: 11/14/2007  . Smokeless tobacco: Never Used  . Alcohol Use: No  . Drug Use: No  . Sexual Activity: Not on file   Other Topics Concern  . Not on file   Social History Narrative   Patient lives at home with his caregiver/ex-wife.   Caffeine- 2 cups of coffee daily     PHYSICAL EXAM  Filed Vitals:   10/23/15 1552  BP: 169/88  Pulse: 64  Weight: 180 lb 9.6 oz (81.92 kg)    Not recorded      Body mass index is 28.28 kg/(m^2).  GENERAL EXAM: Patient is in no distress; MASKED FACIES  CARDIOVASCULAR: Regular rate and rhythm, no murmurs, no carotid bruits  NEUROLOGIC: MENTAL STATUS: awake, alert, SLIGHTLY DECR FLUENCY. Comprehension intact, naming intact CRANIAL NERVE: pupils equal and reactive to light, visual fields full to confrontation, extraocular muscles intact, no nystagmus, facial sensation symmetric; DECR LEFT LOWER FACIAL STRENGTH. MILD DYSARTHRIA uvula midline, shoulder shrug symmetric, tongue midline. MOTOR: INCR TONE IN LUE AND LLE. LUE 1-2/5 PROX, 0 DISTAL. LLE 3/5 WITH FOOT DROP. RARE REST TREMOR IN RUE. MILD RUE POSTURAL TREMOR. MOD BRADYKINESIA WITH RUE AND RLE.  SENSORY: DECR ON LEFT SIDE COORDINATION:  finger-nose-finger, fine finger movements SLOW ON RIGHT. LIMITED ON LEFT DUE TO WEAKNESS. REFLEXES: BRISK IN LUE AND LLE. GAIT/STATION: LEFT HEMIPARIETIC GAIT; USES CANE     DIAGNOSTIC DATA (LABS, IMAGING, TESTING) - I reviewed patient records, labs, notes, testing and imaging myself where available.  Lab Results  Component Value Date   WBC 7.7 07/13/2013   HGB 13.9 09/10/2015   HCT 41.0 09/10/2015   MCV 97.9 07/13/2013   PLT 183 07/13/2013      Component Value Date/Time   NA 138 09/10/2015 0918   K 4.9 09/10/2015 0918   CL 105 09/10/2015 0918   CO2 25 07/13/2013 0814   GLUCOSE 163* 09/10/2015 0918   BUN 21* 09/10/2015 0918   CREATININE 1.20 09/10/2015 0918   CREATININE 1.11 07/13/2013 0814   CALCIUM 9.2 07/13/2013 0814   PROT 6.5 04/10/2015 0846   ALBUMIN 3.9 04/10/2015 0846   AST 18 04/10/2015 0846   ALT 13 04/10/2015 0846   ALKPHOS 63 04/10/2015 0846   BILITOT 0.3 04/10/2015 0846   GFRNONAA >60 05/23/2011 0231   GFRAA >60 05/23/2011 0231   Lab Results  Component Value Date   CHOL 143 04/10/2015   HDL 33* 04/10/2015   LDLCALC 69 04/10/2015   TRIG 205* 04/10/2015   CHOLHDL 4.3 04/10/2015   Lab Results  Component Value Date   HGBA1C 6.5* 05/23/2011   Lab Results  Component Value Date   VITAMINB12 1948* 05/23/2011   Lab Results  Component Value Date   TSH 3.245 07/13/2013    10/19/14 MRI BRAIN 1. Chronic right hemispheric ischemic infarction. 2. Mild chronic small vessel  ischemic disease. 3. No acute findings. No change from MRI on 11/13/06.    ASSESSMENT AND PLAN  70 y.o. year old male  has a past medical history of Hyperlipidemia; Hypertension; Seizure disorder ([place]) (last seizure 2011 due to confusion); Pancreatic pseudocyst; CAD (coronary artery disease) (cardiologist-  dr berry); History of CVA with residual deficit (neurologist-  dr Leta Baptist); History of MI (myocardial infarction); S/P CABG x 4; Left spastic hemiparesis ([place]); Left foot drop;  Hemiparkinsonism [facility]) (neurologist-- dr Leta Baptist); At high risk for falls; Hyperextension deformity of left knee; HOH (hard of hearing); Visual neglect; Type 2 diabetes, diet controlled ([place]); Gait disturbance, post-stroke; History of carotid artery stenosis (cardiologist-- dr berry); Peripheral arterial disease ([place]); Cerebrovascular arteriosclerosis; Bladder cancer ([place]); and Skin cancer. here with history of right MCA stroke and seizure disorder. Patient is doing well on brand name Keppra and Dilantin. Now with right body hemiparkinsonism since 2013. May also have had essential tremor since the 1960's (in retrospect).   Dx: Chronic ischemic right MCA stroke  Parkinsonism, unspecified Parkinsonism type ([place])  Seizure disorder ([place])    PLAN: I spent 25 minutes of face to face time with patient. Greater than 50% of time was spent in counseling and coordination of care with patient. In summary we discussed:  1. Continue brand name dilantin and keppra for seizure control 2. Continue carb/levo 1 tab TID parkinsonism symptoms affecting the right side; may consider primidone in future for essential tremor, but difficulty due to polypharmacy 3. High fall risk due to combination of chronic right MCA stroke and now with right body hemiparkinsonism; use wheelchair and rollator walker with seat  Meds ordered this encounter  Medications  . carbidopa-levodopa (SINEMET IR) 25-100 MG tablet    Sig: Take 1 tablet by mouth 3 (three) times daily.    Dispense:  270 tablet    Refill:  4  . DILANTIN 100 MG ER capsule    Sig: Take 1 capsule (100 mg total) by mouth 3 (three) times daily.    Dispense:  270 capsule    Refill:  4  . KEPPRA 500 MG tablet    Sig: Take 1 tablet (500 mg total) by mouth 2 (two) times daily.    Dispense:  180 tablet    Refill:  4   Return in about 6 months (around 04/21/2016).     Penni Bombard, MD [id number], [id number] PM Certified in Neurology, Neurophysiology and  Neuroimaging  [facility] [address] [place],  [address] [phone]

## 2015-10-24 ENCOUNTER — Encounter (HOSPITAL_BASED_OUTPATIENT_CLINIC_OR_DEPARTMENT_OTHER): Payer: Self-pay | Admitting: *Deleted

## 2015-10-24 NOTE — Progress Notes (Signed)
SPOKE W/ CAREGIVER/ EX-WIFE (HPOA), [name].  ARRIVE AT 0715.  NEEDS ISTAT.  CURRENT EKG IN CHART AND EPIC. WILL TAKE DANTROLENE, SINEMET, DILANTIN, AND KEPPRA AM DOS W/ SIPS OF WATER.

## 2015-10-29 ENCOUNTER — Encounter (HOSPITAL_BASED_OUTPATIENT_CLINIC_OR_DEPARTMENT_OTHER)
Admission: RE | Disposition: A | Discharge status: Home / Self Care | Payer: Self-pay | Source: Ambulatory Visit | Attending: Urology

## 2015-10-29 ENCOUNTER — Encounter (HOSPITAL_BASED_OUTPATIENT_CLINIC_OR_DEPARTMENT_OTHER): Payer: Self-pay | Admitting: *Deleted

## 2015-10-29 ENCOUNTER — Ambulatory Visit (HOSPITAL_BASED_OUTPATIENT_CLINIC_OR_DEPARTMENT_OTHER): Payer: Medicare Other | Admitting: Anesthesiology

## 2015-10-29 ENCOUNTER — Ambulatory Visit (HOSPITAL_BASED_OUTPATIENT_CLINIC_OR_DEPARTMENT_OTHER)
Admission: RE | Admit: 2015-10-29 | Discharge: 2015-10-29 | Disposition: A | Discharge status: Home / Self Care | Payer: Medicare Other | Source: Ambulatory Visit | Attending: Urology | Admitting: Urology

## 2015-10-29 DIAGNOSIS — G709 Myoneural disorder, unspecified: Secondary | ICD-10-CM | POA: Insufficient documentation

## 2015-10-29 DIAGNOSIS — I2510 Atherosclerotic heart disease of native coronary artery without angina pectoris: Secondary | ICD-10-CM | POA: Insufficient documentation

## 2015-10-29 DIAGNOSIS — G20 Parkinson's disease: Secondary | ICD-10-CM | POA: Insufficient documentation

## 2015-10-29 DIAGNOSIS — C674 Malignant neoplasm of posterior wall of bladder: Secondary | ICD-10-CM | POA: Insufficient documentation

## 2015-10-29 DIAGNOSIS — Z8673 Personal history of transient ischemic attack (TIA), and cerebral infarction without residual deficits: Secondary | ICD-10-CM | POA: Insufficient documentation

## 2015-10-29 DIAGNOSIS — Z79899 Other long term (current) drug therapy: Secondary | ICD-10-CM | POA: Diagnosis not present

## 2015-10-29 DIAGNOSIS — I252 Old myocardial infarction: Secondary | ICD-10-CM | POA: Insufficient documentation

## 2015-10-29 DIAGNOSIS — Z85828 Personal history of other malignant neoplasm of skin: Secondary | ICD-10-CM | POA: Diagnosis not present

## 2015-10-29 DIAGNOSIS — Z7901 Long term (current) use of anticoagulants: Secondary | ICD-10-CM | POA: Insufficient documentation

## 2015-10-29 DIAGNOSIS — E785 Hyperlipidemia, unspecified: Secondary | ICD-10-CM | POA: Diagnosis not present

## 2015-10-29 DIAGNOSIS — Z951 Presence of aortocoronary bypass graft: Secondary | ICD-10-CM | POA: Insufficient documentation

## 2015-10-29 DIAGNOSIS — Z87891 Personal history of nicotine dependence: Secondary | ICD-10-CM | POA: Insufficient documentation

## 2015-10-29 DIAGNOSIS — I10 Essential (primary) hypertension: Secondary | ICD-10-CM | POA: Diagnosis not present

## 2015-10-29 DIAGNOSIS — E1151 Type 2 diabetes mellitus with diabetic peripheral angiopathy without gangrene: Secondary | ICD-10-CM | POA: Insufficient documentation

## 2015-10-29 DIAGNOSIS — G40909 Epilepsy, unspecified, not intractable, without status epilepticus: Secondary | ICD-10-CM | POA: Insufficient documentation

## 2015-10-29 DIAGNOSIS — N3090 Cystitis, unspecified without hematuria: Secondary | ICD-10-CM | POA: Insufficient documentation

## 2015-10-29 HISTORY — PX: CYSTOSCOPY WITH HYDRODISTENSION AND BIOPSY: SHX5127

## 2015-10-29 HISTORY — DX: Other specified postprocedural states: Z98.890

## 2015-10-29 HISTORY — DX: Other specified postprocedural states: Z85.828

## 2015-10-29 LAB — POCT I-STAT, CHEM 8
BUN: 25 mg/dL — ABNORMAL HIGH (ref 6–20)
Calcium, Ion: 1.29 mmol/L (ref 1.13–1.30)
Chloride: 104 mmol/L (ref 101–111)
Creatinine, Ser: 1.1 mg/dL (ref 0.61–1.24)
Glucose, Bld: 159 mg/dL — ABNORMAL HIGH (ref 65–99)
HCT: 45 % (ref 39.0–52.0)
Hemoglobin: 15.3 g/dL (ref 13.0–17.0)
Potassium: 5.2 mmol/L — ABNORMAL HIGH (ref 3.5–5.1)
Sodium: 139 mmol/L (ref 135–145)
TCO2: 25 mmol/L (ref 0–100)

## 2015-10-29 LAB — GLUCOSE, CAPILLARY: Glucose-Capillary: 149 mg/dL — ABNORMAL HIGH (ref 65–99)

## 2015-10-29 SURGERY — CYSTOSCOPY, WITH BLADDER HYDRODISTENSION AND BIOPSY
Anesthesia: General | Site: Bladder

## 2015-10-29 MED ORDER — ONDANSETRON HCL 4 MG/2ML IJ SOLN
INTRAMUSCULAR | Status: AC
Start: 2015-10-29 — End: 2015-10-29
  Filled 2015-10-29: qty 2

## 2015-10-29 MED ORDER — PROPOFOL 10 MG/ML IV BOLUS
INTRAVENOUS | Status: DC | PRN
  Administered 2015-10-29: 150 mg via INTRAVENOUS

## 2015-10-29 MED ORDER — LIDOCAINE HCL (CARDIAC) 20 MG/ML IV SOLN
INTRAVENOUS | Status: DC | PRN
  Administered 2015-10-29: 80 mg via INTRAVENOUS

## 2015-10-29 MED ORDER — KETOROLAC TROMETHAMINE 30 MG/ML IJ SOLN
INTRAMUSCULAR | Status: DC | PRN
  Administered 2015-10-29: 30 mg via INTRAVENOUS

## 2015-10-29 MED ORDER — FENTANYL CITRATE (PF) 100 MCG/2ML IJ SOLN
INTRAMUSCULAR | Status: DC | PRN
  Administered 2015-10-29: 25 ug via INTRAVENOUS
  Administered 2015-10-29: 50 ug via INTRAVENOUS
  Administered 2015-10-29: 25 ug via INTRAVENOUS

## 2015-10-29 MED ORDER — CEFAZOLIN SODIUM-DEXTROSE 2-3 GM-% IV SOLR
INTRAVENOUS | Status: AC
  Filled 2015-10-29: qty 50

## 2015-10-29 MED ORDER — LIDOCAINE HCL (CARDIAC) 20 MG/ML IV SOLN
INTRAVENOUS | Status: AC
  Filled 2015-10-29: qty 5

## 2015-10-29 MED ORDER — PHENAZOPYRIDINE HCL 200 MG PO TABS: 200.0000 mg | ORAL_TABLET | Freq: Three times a day (TID) | ORAL | Status: DC | PRN

## 2015-10-29 MED ORDER — CEFAZOLIN SODIUM 1-5 GM-% IV SOLN
1.0000 g | INTRAVENOUS | Status: DC
  Filled 2015-10-29: qty 50

## 2015-10-29 MED ORDER — HYOSCYAMINE SULFATE 0.125 MG SL SUBL: 0.1250 mg | SUBLINGUAL_TABLET | SUBLINGUAL | Status: DC | PRN

## 2015-10-29 MED ORDER — PROPOFOL 10 MG/ML IV BOLUS
INTRAVENOUS | Status: AC
  Filled 2015-10-29: qty 20

## 2015-10-29 MED ORDER — TRIMETHOPRIM 100 MG PO TABS: 100.0000 mg | ORAL_TABLET | ORAL | Status: DC

## 2015-10-29 MED ORDER — KETOROLAC TROMETHAMINE 30 MG/ML IJ SOLN
INTRAMUSCULAR | Status: AC
  Filled 2015-10-29: qty 1

## 2015-10-29 MED ORDER — EPHEDRINE SULFATE 50 MG/ML IJ SOLN
INTRAMUSCULAR | Status: DC | PRN
  Administered 2015-10-29: 10 mg via INTRAVENOUS

## 2015-10-29 MED ORDER — ONDANSETRON HCL 4 MG/2ML IJ SOLN
INTRAMUSCULAR | Status: DC | PRN
  Administered 2015-10-29: 4 mg via INTRAVENOUS

## 2015-10-29 MED ORDER — CEFAZOLIN SODIUM-DEXTROSE 2-3 GM-% IV SOLR
2.0000 g | INTRAVENOUS | Status: AC
  Administered 2015-10-29: 2 g via INTRAVENOUS
  Filled 2015-10-29: qty 50

## 2015-10-29 MED ORDER — SODIUM CHLORIDE 0.9 % IR SOLN
Status: DC | PRN
  Administered 2015-10-29 (×3): 1000 mL via INTRAVESICAL

## 2015-10-29 MED ORDER — TRAMADOL-ACETAMINOPHEN 37.5-325 MG PO TABS: 1.0000 | ORAL_TABLET | Freq: Four times a day (QID) | ORAL | Status: DC | PRN

## 2015-10-29 MED ORDER — STERILE WATER FOR IRRIGATION IR SOLN
Status: DC | PRN
  Administered 2015-10-29: 500 mL

## 2015-10-29 MED ORDER — FENTANYL CITRATE (PF) 100 MCG/2ML IJ SOLN
INTRAMUSCULAR | Status: AC
  Filled 2015-10-29: qty 2

## 2015-10-29 MED ORDER — LACTATED RINGERS IV SOLN
INTRAVENOUS | Status: DC
  Administered 2015-10-29: 08:00:00 via INTRAVENOUS
  Filled 2015-10-29: qty 1000

## 2015-10-29 SURGICAL SUPPLY — 32 items
ADAPTER CATH WHT DISP STRL (CATHETERS) IMPLANT
ADPR CATH MP STRL LF DISP BD (CATHETERS)
BAG DRAIN URO-CYSTO SKYTR STRL (DRAIN) ×2 IMPLANT
BAG DRN UROCATH (DRAIN) ×1
BAG URINE LEG 500ML (DRAIN) ×2 IMPLANT
BOOTIES KNEE HIGH SLOAN (MISCELLANEOUS) ×2 IMPLANT
CATH FOLEY 2WAY SLVR  5CC 16FR (CATHETERS) ×1
CATH FOLEY 2WAY SLVR 5CC 16FR (CATHETERS) ×1 IMPLANT
CATH ROBINSON RED A/P 16FR (CATHETERS) IMPLANT
CLOTH BEACON ORANGE TIMEOUT ST (SAFETY) ×2 IMPLANT
ELECT REM PT RETURN 9FT ADLT (ELECTROSURGICAL) ×2
ELECTRODE REM PT RTRN 9FT ADLT (ELECTROSURGICAL) ×1 IMPLANT
GLOVE BIO SURGEON STRL SZ7.5 (GLOVE) ×2 IMPLANT
GLOVE ECLIPSE 8.0 STRL XLNG CF (GLOVE) IMPLANT
GLOVE INDICATOR 7.5 STRL GRN (GLOVE) ×2 IMPLANT
GLOVE SURG SS PI 7.5 STRL IVOR (GLOVE) ×4 IMPLANT
GOWN STRL REUS W/ TWL LRG LVL3 (GOWN DISPOSABLE) ×1 IMPLANT
GOWN STRL REUS W/ TWL XL LVL3 (GOWN DISPOSABLE) ×1 IMPLANT
GOWN STRL REUS W/TWL LRG LVL3 (GOWN DISPOSABLE) ×2
GOWN STRL REUS W/TWL XL LVL3 (GOWN DISPOSABLE) ×2
KIT ROOM TURNOVER WOR (KITS) ×2 IMPLANT
MANIFOLD NEPTUNE II (INSTRUMENTS) ×2 IMPLANT
NDL SAFETY ECLIPSE 18X1.5 (NEEDLE) ×1 IMPLANT
NEEDLE HYPO 18GX1.5 SHARP (NEEDLE) ×2
NEEDLE HYPO 22GX1.5 SAFETY (NEEDLE) IMPLANT
NEEDLE SPNL 22GX7 QUINCKE BK (NEEDLE) IMPLANT
NS IRRIG 500ML POUR BTL (IV SOLUTION) IMPLANT
PACK CYSTO (CUSTOM PROCEDURE TRAY) ×2 IMPLANT
SYR 20CC LL (SYRINGE) ×4 IMPLANT
SYR BULB IRRIGATION 50ML (SYRINGE) IMPLANT
TUBE CONNECTING 12X1/4 (SUCTIONS) ×2 IMPLANT
WATER STERILE IRR 3000ML UROMA (IV SOLUTION) ×2 IMPLANT

## 2015-10-29 NOTE — Discharge Instructions (Signed)
CYSTOSCOPY HOME CARE INSTRUCTIONS  Activity: Rest for the remainder of the day.  Do not drive or operate equipment today.  You may resume normal activities in one to two days as instructed by your physician.   Meals: Drink plenty of liquids and eat light foods such as gelatin or soup this evening.  You may return to a normal meal plan tomorrow.  Return to Work: You may return to work in one to two days or as instructed by your physician.  Special Instructions / Symptoms: Call your physician if any of these symptoms occur:   -persistent or heavy bleeding  -bleeding which continues after first few urination  -large blood clots that are difficult to pass  -urine stream diminishes or stops completely  -fever equal to or higher than 101 degrees Farenheit.  -cloudy urine with a strong, foul odor  -severe pain  Females should always wipe from front to back after elimination.  You may feel some burning pain when you urinate.  This should disappear with time.  Applying moist heat to the lower abdomen or a hot tub bath may help relieve the pain. \  Follow-Up / Date of Return Visit to Your Physician: Friday 11/02/15 for cath removal Call for an appointment to arrange follow-up.  Patient Signature:  ________________________________________________________  Nurse's Signature:  ________________________________________________________    Post Anesthesia Home Care Instructions  Activity: Get plenty of rest for the remainder of the day. A responsible adult should stay with you for 24 hours following the procedure.  For the next 24 hours, DO NOT: -Drive a car -Paediatric nurse -Drink alcoholic beverages -Take any medication unless instructed by your physician -Make any legal decisions or sign important papers.  Meals: Start with liquid foods such as gelatin or soup. Progress to regular foods as tolerated. Avoid greasy, spicy, heavy foods. If nausea and/or vomiting occur, drink only clear  liquids until the nausea and/or vomiting subsides. Call your physician if vomiting continues.  Special Instructions/Symptoms: Your throat may feel dry or sore from the anesthesia or the breathing tube placed in your throat during surgery. If this causes discomfort, gargle with warm salt water. The discomfort should disappear within 24 hours.  If you had a scopolamine patch placed behind your ear for the management of post- operative nausea and/or vomiting:  1. The medication in the patch is effective for 72 hours, after which it should be removed.  Wrap patch in a tissue and discard in the trash. Wash hands thoroughly with soap and water. 2. You may remove the patch earlier than 72 hours if you experience unpleasant side effects which may include dry mouth, dizziness or visual disturbances. 3. Avoid touching the patch. Wash your hands with soap and water after contact with the patch.

## 2015-10-29 NOTE — H&P (Signed)
Reason For Visit Post op f/u   Active Problems Problems  1. Malignant neoplasm of posterior wall of urinary bladder (C67.4)   Assessed By: Carolan Clines (Urology); Last Assessed: 17 Sep 2015  History of Present Illness    70 yo male returns today to review pathology after cysto/bilateral RPG/TURBT on 11/2//16 for bladder cancer. He has done very well since the surgery, with no more bleeding. He feels well, and is voiding well.    He has a Hx of gross hematuria & he is currently on an anticoagulant. (Aggrenox). Previous hx of a PSA elevation of 3.83, then 5.28/11.9% noted by Dr. Stephanie Acre. He is no longer using Androgel. PSA 6.61/16% in Oct 2010. In the past, he didn't want to stop his Aggrenox for biopsy, and pharmacist "would not prescribe" Levaquin for him.     11/28/11 PSA - 4.85/11%  04/23/10 Psa 5.07/13%,  01/22/10 Psa 6.06/23.4%  10/11/09 Psa 5.28/11.9%.   Past Medical History Problems  1. History of Acute Myocardial Infarction 2. History of basal cell carcinoma (Z85.828) 3. History of cardiac disorder (Z86.79) 4. History of hyperlipidemia (Z86.39) 5. History of hypertension (Z86.79) 6. History of seizure disorder (Z86.69) 7. History of type 2 diabetes mellitus (Z86.39) 8. History of Pancreatitis 9. History of Stroke syndrome (I63.9)  Surgical History Problems  1. History of CABG 2. History of Cholecystectomy 3. History of Surgery Vas Deferens Vasectomy  Current Meds 1. Aggrenox 25-200 MG Oral Capsule Extended Release 12 Hour;  Therapy: (Recorded:12Oct2010) to Recorded 2. Benicar 20 MG Oral Tablet;  Therapy: 26Mar2013 to Recorded 3. Carbidopa-Levodopa 25-100 MG Oral Tablet;  Therapy: (Recorded:01Nov2016) to Recorded 4. CoQ10 50 MG Oral Capsule;  Therapy: (Recorded:12Oct2010) to Recorded 5. Crestor 20 MG Oral Tablet;  Therapy: (Recorded:12Oct2010) to Recorded 6. Dantrolene Sodium 50 MG Oral Capsule;  Therapy: (Recorded:12Oct2010) to Recorded 7.  Dilantin 100 MG Oral Capsule;  Therapy: (Recorded:26Apr2013) to Recorded 8. Flaxseed Oil 1000 MG Oral Capsule;  Therapy: (Recorded:12Oct2010) to Recorded 9. Folbic 2.5-25-2 MG Oral Tablet;  Therapy: (Recorded:01Nov2016) to Recorded 10. Folbic 2.5-25-2 MG Oral Tablet;   Therapy: (Recorded:12Oct2010) to Recorded 11. Keppra 500 MG Oral Tablet;   Therapy: (Recorded:12Oct2010) to Recorded 12. Magnesium 250 MG Oral Tablet;   Therapy: (Recorded:26Apr2013) to Recorded 13. Metoprolol Tartrate 25 MG Oral Tablet;   Therapy: (Recorded:12Oct2010) to Recorded 14. Sulfamethoxazole-Trimethoprim 800-160 MG Oral Tablet; Take 1 tablet twice daily;   Therapy: [id number] to (Last Rx:02Nov2016)  Requested for: [id number] Ordered 15. Tricor 48 MG Oral Tablet;   Therapy: (Recorded:01Nov2016) to Recorded 16. Vitamin C TABS;   Therapy: (Recorded:12Oct2010) to Recorded 17. Vitamin D TABS;   Therapy: (Recorded:01Nov2016) to Recorded 18. Zetia 10 MG Oral Tablet;   Therapy: (Recorded:12Oct2010) to Recorded  Allergies Medication  1. No Known Drug Allergies  Family History Problems  1. Family history of Diabetes Mellitus : Brother 2. Denied: Family history of Family Health Status - Mother's Age : Father   8 3. Family history of Family Health Status Number Of Children : Father   three sons and one daughter 24. Family history of Father Deceased At Age ___ : Father   71 5. Family history of Hyperlipidemia : Sister 41. Family history of Hypertension : Brother 36. Family history of Hypertension : Father  Social History Problems  1. Denied: History of Alcohol Use (History) 2. Caffeine Use   two cups of cofee daily 3. Former smoker [phone] 4. Marital History - Divorced 5. Mother deceased   34 6. Number of children  3 sons/ 1 daughter 7. Occupation:   retired 5. Retired 40. Tobacco Use   quit 2 1/2 yrs ago smoked 2 packs daily for 48 years  Review of Systems Constitutional, skin, eye,  otolaryngeal, hematologic/lymphatic, cardiovascular, pulmonary, endocrine, musculoskeletal, gastrointestinal, neurological and psychiatric system(s) were reviewed and pertinent findings if present are noted and are otherwise negative.  Genitourinary: feelings of urinary urgency, difficulty starting the urinary stream, weak urinary stream and erectile dysfunction, but no urinary frequency, no dysuria, no nocturia, no incontinence, urinary stream does not start and stop, no incomplete emptying of bladder, no post-void dribbling and no hematuria.  Musculoskeletal: back pain.  Other Symptoms: wheelchairbound.    Vitals Vital Signs [Data Includes: Last 1 Day]  Recorded: [id number] 12:40PM  Temperature: 97.8 F  Physical Exam Constitutional: Well nourished and well developed . No acute distress. The patient appears well hydrated. Wheelchair-bound.  ENT:. The ears and nose are normal in appearance. Hearing loss is noted.  Neck: The appearance of the neck is normal.  Pulmonary: No respiratory distress.  Cardiovascular:. No peripheral edema.  Abdomen: The abdomen is rounded, but not distended. The abdomen is soft and nontender. No masses are palpated. Bowel sounds are normal. No hernias are palpable. not tender  Rectal: Rectal exam demonstrates absent sphincter tone. Estimated prostate size is 3+. Bladder not fixed. The prostate has a palpable nodule involving the left, mid aspect of the prostate which appears to be confined within the prostate capsule, is not indurated, is not tender and is not fluctuant.  Genitourinary: Examination of the penis demonstrates no discharge, no masses, no lesions and a normal meatus. The penis is uncircumcised. The scrotum is normal in appearance and without lesions. The right epididymis is palpably normal and non-tender. The left epididymis is palpably normal and non-tender. The right testis is palpably normal, non-tender and without masses. The left testis is normal,  non-tender and without masses.  Lymphatics: The femoral and inguinal nodes are not enlarged or tender.  Skin: Normal skin turgor, no visible rash and no visible skin lesions.  Neuro/Psych:. Mood and affect are appropriate.    Results/Data Urine [Data Includes: Last 1 Day]   [id number]  COLOR YELLOW   APPEARANCE CLEAR   SPECIFIC GRAVITY 1.010   pH 6.0   GLUCOSE NEGATIVE   BILIRUBIN NEGATIVE   KETONE NEGATIVE   BLOOD 1+   PROTEIN NEGATIVE   NITRITE NEGATIVE   LEUKOCYTE ESTERASE NEGATIVE   SQUAMOUS EPITHELIAL/HPF 0-5 HPF  WBC 0-5 WBC/HPF  RBC 3-10 RBC/HPF  BACTERIA NONE SEEN HPF  CRYSTALS NONE SEEN HPF  CASTS NONE SEEN LPF  Yeast NONE SEEN HPF   Old records or history reviewed:Marland Kitchen  The following clinical lab reports were reviewed: Marland Kitchen  The following medical tests were reviewed:Marland Kitchen  The following radiology reports were reviewed:Marland Kitchen  Will request old records/history:.  Discussed: .    Assessment Assessed  1. Former smoker [phone]) 2. Malignant neoplasm of posterior wall of urinary bladder (C67.4) 3. Gross hematuria (R31.0)  Today I have reviewed the particulars from the patient's recent surgery, including photographs, and pathology of his transurethral bladder tumor resection. Note that the patient has multiple high-grade bladder tumors, with at least one area of invasion through the basement membrane. We have discussed grading of bladder cancer, and staging of bladder cancer. The patient needs another transurethral deep muscle biopsy, particularly in the back wall where the majority of his bladder cancer existed. This is to determine if he has muscle invasion. The patient  understands that this is important because he may need either a bladder wash chemotherapy with BCG, or possibly cystectomy. He has wife understand this.   Plan Health Maintenance  1. UA With REFLEX; [Do Not Release]; Status:Resulted - Requires Verification;   [id number] 12:22PM  We will plan for repeat  transurethral bladder biopsy with deep muscle biopsies, particularly in the back wall the bladder for the second week in January. If any other tumors are identifiable we will resect those at this time. We will then decide what course of action to take. I have considered MRI the patient's pelvis and bladder, but noted that he has carotid stents in place, and could only have MRI with low voltage machine.   Signatures Electronically signed by : Carolan Clines, M.D.; Sep 17 2015  5:58PM EST

## 2015-10-29 NOTE — Anesthesia Postprocedure Evaluation (Signed)
Anesthesia Post Note  Patient: [name]  Procedure(s) Performed: Procedure(s) (LRB): REPEAT CYSTOSCOPY BIOPSY BLADDER DEEP MUSCLES BLADDER BIOPSY (N/A)  Patient location during evaluation: PACU Anesthesia Type: General Level of consciousness: sedated Pain management: satisfactory to patient Vital Signs Assessment: post-procedure vital signs reviewed and stable Respiratory status: spontaneous breathing Cardiovascular status: stable Anesthetic complications: no    Last Vitals:  Filed Vitals:   10/29/15 1015 10/29/15 1105  BP: 165/74 155/62  Pulse: 61 63  Temp:  36.4 C  Resp: 14 16    Last Pain: There were no vitals filed for this visit.               Riccardo Dubin

## 2015-10-29 NOTE — Op Note (Signed)
Pre-operative diagnosis :  T1b Ca bladder  Postoperative diagnosis:  same  Operation: cysto, TUR-bladder biopsies    Surgeon:  S. Gaynelle Arabian, MD  First assistant:  none  Anesthesia: General LMA  Preparation:  After appropriate pre-medication, the patient was brought to the operating room and placed on the operating table in the dorsal supine position where general LMA anesthesia was introduced. He was replaced in the dorsal lithotomy position with pubis was prepped with Betadine solution and draped in usual fashion. The arm band was double checked. The history was double checked.  Review history:  Problems  1. Malignant neoplasm of posterior wall of urinary bladder (C67.4)  Assessed By: Carolan Clines (Urology); Last Assessed: 17 Sep 2015  History of Present Illness   70 yo male returns today to review pathology after cysto/bilateral RPG/TURBT on 11/2//16 for bladder cancer. He has done very well since the surgery, with no more bleeding. He feels well, and is voiding well.    He has a Hx of gross hematuria & he is currently on an anticoagulant. (Aggrenox). Previous hx of a PSA elevation of 3.83, then 5.28/11.9% noted by Dr. Stephanie Acre. He is no longer using Androgel. PSA 6.61/16% in Oct 2010. In the past, he didn't want to stop his Aggrenox for biopsy, and pharmacist "would not prescribe" Levaquin for him.    He is now for 2nd look for repeat bladder bx, to decide BCG vs cystectomy.      Statement of  Likelihood of Success: Excellent. TIME-OUT observed.:  Procedure:  Cystourethroscopy was accomplished, which showed a normal-appearing bladder with clear reflux from both ureteral orifices, identified our normal trigone. In the back wall the bladder, an area of previous resection was identified, with necrotic debris identified. I elected to take deep resection of this area, this was accomplished using the transurethral loop. Following this, a second area, 1 cm medial to this area  was identified, and appeared to be flat and abnormal, and this area was also biopsied with the loop, with deep muscle biopsy. Both pieces of tissue were sent to laboratory for evaluation together.  No bleeding was noted after cauterization of the wound edges. I elected to place a 25 French Foley catheter in place, which drained clear urine. The patient received IV Toradol, and was awakened and taken to recovery room in good condition.

## 2015-10-29 NOTE — Anesthesia Procedure Notes (Signed)
Procedure Name: LMA Insertion Date/Time: 10/29/2015 8:51 AM Performed by: Denna Haggard D Pre-anesthesia Checklist: Patient identified, Emergency Drugs available, Suction available and Patient being monitored Patient Re-evaluated:Patient Re-evaluated prior to inductionOxygen Delivery Method: Circle System Utilized Preoxygenation: Pre-oxygenation with 100% oxygen Intubation Type: IV induction Ventilation: Mask ventilation without difficulty LMA: LMA inserted LMA Size: 4.0 Number of attempts: 1 Airway Equipment and Method: Bite block Placement Confirmation: positive ETCO2 Tube secured with: Tape Dental Injury: Teeth and Oropharynx as per pre-operative assessment

## 2015-10-29 NOTE — Anesthesia Preprocedure Evaluation (Addendum)
Anesthesia Evaluation  Patient identified by MRN, date of birth, ID band Patient awake    Reviewed: Allergy & Precautions, H&P , NPO status , Patient's Chart, lab work & pertinent test results  Airway Mallampati: II  TM Distance: >3 FB Neck ROM: full    Dental  (+) Dental Advisory Given, Edentulous Upper, Edentulous Lower   Pulmonary neg pulmonary ROS, former smoker,    Pulmonary exam normal breath sounds clear to auscultation       Cardiovascular Exercise Tolerance: Good hypertension, Pt. on medications and Pt. on home beta blockers + CAD, + CABG and + Peripheral Vascular Disease  Normal cardiovascular exam Rhythm:regular Rate:Normal     Neuro/Psych Seizures -, Well Controlled,  Parkinsonism. Carotid stenosis with CEA. Left spastic hemiparesis  Neuromuscular disease CVA, Residual Symptoms negative neurological ROS  negative psych ROS   GI/Hepatic negative GI ROS, Neg liver ROS,   Endo/Other  diabetes, Well Controlled, Type 2  Renal/GU negative Renal ROS  negative genitourinary   Musculoskeletal   Abdominal   Peds  Hematology negative hematology ROS (+)   Anesthesia Other Findings No significant change in health since last OR procedure NPO Takes BP meds at  Night Left arm has minimum movement; positioning will be atypical  Reproductive/Obstetrics negative OB ROS                            Anesthesia Physical  Anesthesia Plan  ASA: III  Anesthesia Plan: General   Post-op Pain Management:    Induction: Intravenous  Airway Management Planned: LMA  Additional Equipment:   Intra-op Plan:   Post-operative Plan:   Informed Consent: I have reviewed the patients History and Physical, chart, labs and discussed the procedure including the risks, benefits and alternatives for the proposed anesthesia with the patient or authorized representative who has indicated his/her understanding  and acceptance.   Dental Advisory Given  Plan Discussed with: CRNA and Surgeon  Anesthesia Plan Comments:         Anesthesia Quick Evaluation

## 2015-10-29 NOTE — Transfer of Care (Signed)
Immediate Anesthesia Transfer of Care Note  Patient: [name]  Procedure(s) Performed: Procedure(s) (LRB): REPEAT CYSTOSCOPY BIOPSY BLADDER DEEP MUSCLES BLADDER BIOPSY (N/A)  Patient Location: PACU  Anesthesia Type: General  Level of Consciousness: awake, oriented, sedated and patient cooperative  Airway & Oxygen Therapy: Patient Spontanous Breathing and Patient connected to face mask oxygen  Post-op Assessment: Report given to PACU RN and Post -op Vital signs reviewed and stable  Post vital signs: Reviewed and stable  Complications: No apparent anesthesia complications

## 2015-10-29 NOTE — Interval H&P Note (Signed)
History and Physical Interval Note:  10/29/2015 9:28 AM  [name]  has presented today for surgery, with the diagnosis of BLADDER CANCER  The various methods of treatment have been discussed with the patient and family. After consideration of risks, benefits and other options for treatment, the patient has consented to  Procedure(s): REPEAT CYSTOSCOPY BIOPSY BLADDER DEEP MUSCLES BLADDER BIOPSY (N/A) as a surgical intervention .  The patient's history has been reviewed, patient examined, no change in status, stable for surgery.  I have reviewed the patient's chart and labs.  Questions were answered to the patient's satisfaction.     Shaketha Jeon I Mekayla Soman

## 2015-10-30 ENCOUNTER — Encounter (HOSPITAL_BASED_OUTPATIENT_CLINIC_OR_DEPARTMENT_OTHER): Payer: Self-pay | Admitting: Urology

## 2015-10-30 ENCOUNTER — Other Ambulatory Visit: Payer: Self-pay | Admitting: Diagnostic Neuroimaging

## 2015-11-19 ENCOUNTER — Other Ambulatory Visit: Payer: Self-pay | Admitting: Diagnostic Neuroimaging

## 2015-12-30 ENCOUNTER — Other Ambulatory Visit: Payer: Self-pay | Admitting: Cardiovascular Disease

## 2015-12-31 NOTE — Telephone Encounter (Signed)
Rx(s) sent to pharmacy electronically.  

## 2016-03-06 ENCOUNTER — Other Ambulatory Visit: Payer: Self-pay | Admitting: Urology

## 2016-03-07 ENCOUNTER — Telehealth: Payer: Self-pay | Admitting: *Deleted

## 2016-03-07 NOTE — Telephone Encounter (Signed)
Requesting surgical clearance:   1. Type of surgery: TURBT  2. Surgeon: Dr Gaynelle Arabian  3. Surgical date: 04/14/2016  4. Medications that need to be held: they want to hold aggrenox for 5 days.   5. CAD: YES     6. I will defer to: Dr [facility]  [phone] -fax [phone] ext [phone] [facility])

## 2016-03-08 NOTE — Telephone Encounter (Signed)
OK to interrupt anti platelet Rx for TURP

## 2016-03-11 NOTE — Telephone Encounter (Signed)
This clearance routed to provided number.

## 2016-03-21 ENCOUNTER — Other Ambulatory Visit: Payer: Self-pay | Admitting: Diagnostic Neuroimaging

## 2016-03-21 ENCOUNTER — Other Ambulatory Visit: Payer: Self-pay | Admitting: Physical Medicine & Rehabilitation

## 2016-03-21 ENCOUNTER — Other Ambulatory Visit: Payer: Self-pay | Admitting: Cardiovascular Disease

## 2016-03-24 NOTE — Telephone Encounter (Signed)
Rx request sent to pharmacy.  

## 2016-03-24 NOTE — Telephone Encounter (Signed)
Rx(s) sent to pharmacy electronically.  

## 2016-03-25 ENCOUNTER — Other Ambulatory Visit: Payer: Self-pay | Admitting: Cardiovascular Disease

## 2016-04-10 ENCOUNTER — Encounter (HOSPITAL_COMMUNITY)
Admission: RE | Admit: 2016-04-10 | Discharge: 2016-04-10 | Disposition: A | Discharge status: Home / Self Care | Payer: Medicare Other | Source: Ambulatory Visit | Attending: Urology | Admitting: Urology

## 2016-04-10 ENCOUNTER — Encounter (HOSPITAL_COMMUNITY): Payer: Self-pay

## 2016-04-10 DIAGNOSIS — Z01812 Encounter for preprocedural laboratory examination: Secondary | ICD-10-CM | POA: Diagnosis not present

## 2016-04-10 DIAGNOSIS — N329 Bladder disorder, unspecified: Secondary | ICD-10-CM | POA: Insufficient documentation

## 2016-04-10 LAB — BASIC METABOLIC PANEL
Anion gap: 5 (ref 5–15)
BUN: 29 mg/dL — ABNORMAL HIGH (ref 6–20)
CO2: 26 mmol/L (ref 22–32)
Calcium: 9.8 mg/dL (ref 8.9–10.3)
Chloride: 107 mmol/L (ref 101–111)
Creatinine, Ser: 1.15 mg/dL (ref 0.61–1.24)
GFR calc Af Amer: >60 mL/min (ref >60)
GFR calc non Af Amer: >60 mL/min (ref >60)
Glucose, Bld: 151 mg/dL — ABNORMAL HIGH (ref 65–99)
Potassium: 6.1 mmol/L — ABNORMAL HIGH (ref 3.5–5.1)
Sodium: 138 mmol/L (ref 135–145)

## 2016-04-10 LAB — CBC
HCT: 41.9 % (ref 39.0–52.0)
Hemoglobin: 13.8 g/dL (ref 13.0–17.0)
MCH: 32.6 pg (ref 26.0–34.0)
MCHC: 32.9 g/dL (ref 30.0–36.0)
MCV: 99.1 fL (ref 78.0–100.0)
Platelets: 196 10*3/uL (ref 150–400)
RBC: 4.23 MIL/uL (ref 4.22–5.81)
RDW: 13.4 % (ref 11.5–15.5)
WBC: 7.4 10*3/uL (ref 4.0–10.5)

## 2016-04-10 NOTE — Patient Instructions (Addendum)
[facility]IL[facility] BRAR  04/10/2016   Your procedure is scheduled on: Thursday 04/17/2016  Report to [facility]  Entrance take [place]  elevators to 3rd floor to  [place] at  [place]  AM.  Call this number if you have problems the morning of surgery [phone]   Remember: ONLY 1 PERSON MAY GO WITH YOU TO SHORT STAY TO GET  READY MORNING OF [place].   Do not eat food or drink liquids :After Midnight.     Take these medicines the morning of surgery with A SIP OF WATER: Sinemet IR, Dilantin, Keppra, Dantrolene                                You may not have any metal on your body including hair pins and              piercings  Do not wear jewelry, make-up, lotions, powders or perfumes, deodorant             Do not wear nail polish.  Do not shave  48 hours prior to surgery.              Men may shave face and neck.   Do not bring valuables to the hospital. [place].  Contacts, dentures or bridgework may not be worn into surgery.  Leave suitcase in the car. After surgery it may be brought to your room.     Patients discharged the day of surgery will not be allowed to drive home.  Name and phone number of your driver: ex-spouse- [name]  Special Instructions: N/A              Please read over the following fact sheets you were given: _____________________________________________________________________             [facility] [place] - Preparing for Surgery Before surgery, you can play an important role.  Because skin is not sterile, your skin needs to be as free of germs as possible.  You can reduce the number of germs on your skin by washing with CHG (chlorahexidine gluconate) soap before surgery.  CHG is an antiseptic cleaner which kills germs and bonds with the skin to continue killing germs even after washing. Please DO NOT use if you have an allergy to CHG or antibacterial soaps.  If your skin becomes  reddened/irritated stop using the CHG and inform your nurse when you arrive at Short Stay. Do not shave (including legs and underarms) for at least 48 hours prior to the first CHG shower.  You may shave your face/neck. Please follow these instructions carefully:  1.  Shower with CHG Soap the night before surgery and the  morning of Surgery.  2.  If you choose to wash your hair, wash your hair first as usual with your  normal  shampoo.  3.  After you shampoo, rinse your hair and body thoroughly to remove the  shampoo.                           4.  Use CHG as you would any other liquid soap.  You can apply chg directly  to the skin and wash  Gently with a scrungie or clean washcloth.  5.  Apply the CHG Soap to your body ONLY FROM THE NECK DOWN.   Do not use on face/ open                           Wound or open sores. Avoid contact with eyes, ears mouth and genitals (private parts).                       Wash face,  Genitals (private parts) with your normal soap.             6.  Wash thoroughly, paying special attention to the area where your surgery  will be performed.  7.  Thoroughly rinse your body with warm water from the neck down.  8.  DO NOT shower/wash with your normal soap after using and rinsing off  the CHG Soap.                9.  Pat yourself dry with a clean towel.            10.  Wear clean pajamas.            11.  Place clean sheets on your bed the night of your first shower and do not  sleep with pets. Day of Surgery : Do not apply any lotions/deodorants the morning of surgery.  Please wear clean clothes to the hospital/surgery center.  FAILURE TO FOLLOW THESE INSTRUCTIONS MAY RESULT IN THE CANCELLATION OF YOUR SURGERY PATIENT SIGNATURE_________________________________  NURSE SIGNATURE__________________________________  ________________________________________________________________________

## 2016-04-10 NOTE — Progress Notes (Signed)
02/11/2016-Surgical clearance from Dr. [name] and EKG on chart.

## 2016-04-16 NOTE — H&P (Signed)
Reason For Visit 4 mo cystoscopy  Active Problems Problems  1. Elevated prostate specific antigen (PSA) (R97.20)  Assessed By: Carolan Clines (Urology); Last Assessed: 06 Feb 2012 2. Malignant neoplasm of anterior wall of urinary bladder (C67.3)  Assessed By: Carolan Clines (Urology); Last Assessed: 06 Feb 2016 3. Malignant neoplasm of lateral wall of urinary bladder (C67.2)  Assessed By: Carolan Clines (Urology); Last Assessed: 06 Feb 2016 4. Malignant neoplasm of posterior wall of urinary bladder (C67.4)  Assessed By: Carolan Clines (Urology); Last Assessed: 06 Feb 2016 History of Present Illness 70 yo [name] returns today for a 4 mo cystoscopy for hx of bladder cancer. He is s/p BCG tx weekly x 6 (last on 12/27/15). He is s/p cysto/bilateral RPG/TURBT on 11/2//16 for bladder cancer. He has done very well since the surgery, with no more bleeding. He feels well, and is voiding well. He has a Hx of gross hematuria & he is currently on an anticoagulant. (Aggrenox). Previous hx of a PSA elevation of 3.83, then 5.28/11.9% noted by Dr. Stephanie [name]. He is no longer using Androgel. PSA 6.61/16% in Oct 2010. In the past, he didn't want to stop his Aggrenox for biopsy, and pharmacist "would not prescribe" Levaquin for him.  11/28/11 PSA - 4.85/11% 04/23/10 Psa 5.07/13%, 01/22/10 Psa 6.06/23.4% 10/11/09 Psa 5.28/11.9%.  Past Medical History Problems  1. History of basal cell carcinoma (Z85.828) 2. History of cardiac disorder (Z86.79) 3. History of hyperlipidemia (Z86.39) 4. History of hypertension (Z86.79) 5. History of myocardial infarction (I25.2) 6. History of seizure disorder (Z86.69) 7. History of type 2 diabetes mellitus (Z86.39) 8. History of Pancreatitis 9. History of Stroke syndrome (I63.9) Surgical History Problems  1. History of CABG 2. History of Cholecystectomy 3. History of Cystoscopy With Biopsy 4. History of Surgery Vas Deferens Vasectomy Current Meds 1.  Aggrenox 25-200 MG Oral Capsule Extended Release 12 Hour; Therapy: (Recorded:12Oct2010) to Recorded 2. Benicar 20 MG Oral Tablet; Therapy: 26Mar2013 to Recorded 3. Carbidopa-Levodopa 25-100 MG Oral Tablet; Therapy: (Recorded:01Nov2016) to Recorded 4. CoQ10 50 MG Oral Capsule; Therapy: (Recorded:12Oct2010) to Recorded 5. Crestor 20 MG Oral Tablet; Therapy: (Recorded:12Oct2010) to Recorded 6. Dantrolene Sodium 50 MG Oral Capsule; Therapy: (Recorded:12Oct2010) to Recorded 7. Dilantin 100 MG Oral Capsule; Therapy: (Recorded:26Apr2013) to Recorded 8. Flaxseed Oil 1000 MG Oral Capsule; Therapy: (Recorded:12Oct2010) to Recorded 9. Folbic 2.5-25-2 MG Oral Tablet; Therapy: (Recorded:01Nov2016) to Recorded 10. Folbic 2.5-25-2 MG Oral Tablet; Therapy: (Recorded:12Oct2010) to Recorded 11. Keppra 500 MG Oral Tablet; Therapy: (Recorded:12Oct2010) to Recorded 12. Magnesium 250 MG Oral Tablet; Therapy: (Recorded:26Apr2013) to Recorded 13. Metoprolol Tartrate 25 MG Oral Tablet; Therapy: (Recorded:12Oct2010) to Recorded 14. Tricor 48 MG Oral Tablet; Therapy: (Recorded:01Nov2016) to Recorded 15. Vitamin C TABS; Therapy: (Recorded:12Oct2010) to Recorded 16. Vitamin D TABS; Therapy: (Recorded:01Nov2016) to Recorded 17. Zetia 10 MG Oral Tablet; Therapy: (Recorded:12Oct2010) to Recorded Allergies Medication  1. No Known Drug Allergies Family History Problems  1. Family history of Diabetes Mellitus : Brother 2. Denied: Family history of Family Health Status - Mother's Age : Father  63 3. Family history of Family Health Status Number Of Children : Father  three sons and one daughter 69. Family history of Father Deceased At Age ___ : Father  6 5. Family history of Hyperlipidemia : Sister 37. Family history of Hypertension : Brother 86. Family history of Hypertension : Father Social History Problems  1. Denied: History of Alcohol Use (History) 2. Caffeine Use  two cups of cofee daily 3.  Former smoker [phone]) 4. Marital History -  Divorced 5. Mother deceased  16 6. Number of children  3 sons/ 1 daughter 7. Occupation:  retired 12. Retired 28. Tobacco Use  quit 2 1/2 yrs ago smoked 2 packs daily for 48 years Review of Systems Constitutional, skin, eye, otolaryngeal, hematologic/lymphatic, cardiovascular, pulmonary, endocrine, musculoskeletal, gastrointestinal, neurological and psychiatric system(s) were reviewed and pertinent findings if present are noted and are otherwise negative.  Genitourinary: feelings of urinary urgency, difficulty starting the urinary stream, weak urinary stream and erectile dysfunction, but no urinary frequency, no dysuria, no nocturia, no incontinence, urinary stream does not start and stop, no incomplete emptying of bladder, no post-void dribbling and no hematuria.  Musculoskeletal: back pain.  Other Symptoms: wheelchairbound.  Vitals Vital Signs [Data Includes: Last 1 Day]  Recorded: 26Apr2017 11:34AM  Blood Pressure: 177 / 93 Temperature: 98.2 F Heart Rate: 61 Physical Exam Genitourinary: Examination of the penis demonstrates no discharge, no masses, no lesions and a normal meatus. The penis is uncircumcised. The scrotum is normal in appearance and without lesions. The right epididymis is palpably normal and non-tender. The left epididymis is palpably normal and non-tender. The right testis is palpably normal, non-tender and without masses. The left testis is normal, non-tender and without masses.  Lymphatics: The femoral and inguinal nodes are not enlarged or tender.  Skin: Normal skin turgor, no visible rash and no visible skin lesions.  Neuro/Psych:. Mood and affect are appropriate.  Results/Data Urine [Data Includes: Last 1 Day]   26Apr2017  COLOR YELLOW   APPEARANCE CLEAR   SPECIFIC GRAVITY 1.010   pH 6.0   GLUCOSE NEGATIVE   BILIRUBIN NEGATIVE   KETONE NEGATIVE   BLOOD NEGATIVE   PROTEIN TRACE   NITRITE NEGATIVE   LEUKOCYTE  ESTERASE NEGATIVE   Procedure Procedure: Cystoscopy  Chaperone Present: Hillary Bow.  Indication: History of Urothelial Carcinoma.  Informed Consent: Risks, benefits, and potential adverse events were discussed and informed consent was obtained from the patient.  Prep: The patient was prepped with betadine.  Anesthesia:. Local anesthesia was administered intraurethrally with 2% lidocaine jelly.  Antibiotic prophylaxis: Ciprofloxacin.  Procedure Note:  Urethral meatus:. No abnormalities.  Anterior urethra: No abnormalities.  Prostatic urethra: No abnormalities . No intravesical median lobe was visualized.  Bladder: Visulization was clear. The ureteral orifices were in the normal anatomic position bilaterally and had clear efflux of urine. Examination of the bladder demonstrated no clot within the bladder, no trabeculation and no diverticulum no erythematous mucosa, no ulcer, no edema and no cellules. A solitary tumor was visualized in the bladder. A papillary tumor was seen in the bladder measuring approximately 2-3 cm in size. This tumor was located on the left side, on the anterior aspect of the bladder. Another papillary tumor was seen in the bladder measuring approximately 3 cm in size. This tumor was located on the left side, at the base of the bladder. The patient tolerated the procedure well.  Complications: None.  Assessment Assessed  1. Malignant neoplasm of anterior wall of urinary bladder (C67.3) 2. Malignant neoplasm of lateral wall of urinary bladder (C67.2) 3. Malignant neoplasm of posterior wall of urinary bladder (C67.4) 4. History of myocardial infarction (I25.2) 5. History of hyperlipidemia (Z86.39) 6. History of hypertension (Z86.79) 7. History of seizure disorder (Z86.69) 8. History of type 2 diabetes mellitus (Z86.39) 9. History of Stroke syndrome (I63.9) Recurrent TCC of the bladder in 2 locations: Left upper sidewall measuring 3 cm, and left bladder base, lateral to the  trigone, 3 cm. The original  location, back wall, is negative. The patient has received BCG induction, and we'll continue his monthly BCG.  Plan  Health Maintenance  1. UA With REFLEX; [Do Not Release]; Status:Resulted - Requires Verification; Done: 26Apr2017 11:00AM Schedule surgery for cysto/TURBT.  Follow-up [place] Procedure Office Follow-up Status: Hold For - Date of Service Requested for: [phone] Ordered;  For: Malignant neoplasm of anterior wall of urinary bladder, Malignant neoplasm of lateral wall of urinary bladder, Malignant neoplasm of posterior wall of urinary bladder; Ordered By: Carolan Clines Performed: Order Comments: BCG monthly x 6 (starting week of May 1st) Due: 11Jun2017 Marked Important; Last Updated By: Jarome Lamas; 02/06/2016 11:54:47 AM  Signatures   Recurrence of TCC, for extirpation of tumor.    The information contained in this medical record document is considered private and confidential patient information. This information can only be used for the medical diagnosis and/or medical services that are being provided by the patient's selected caregivers. This information can only be distributed outside of the patient's care if the patient agrees and signs waivers of authorization for this information to be sent to an outside source or route.

## 2016-04-17 ENCOUNTER — Ambulatory Visit (HOSPITAL_COMMUNITY)
Admission: RE | Admit: 2016-04-17 | Discharge: 2016-04-17 | Disposition: A | Discharge status: Home / Self Care | Payer: Medicare Other | Source: Ambulatory Visit | Attending: Urology | Admitting: Urology

## 2016-04-17 ENCOUNTER — Ambulatory Visit (HOSPITAL_COMMUNITY): Payer: Medicare Other | Admitting: Anesthesiology

## 2016-04-17 ENCOUNTER — Encounter (HOSPITAL_COMMUNITY): Payer: Self-pay

## 2016-04-17 ENCOUNTER — Encounter (HOSPITAL_COMMUNITY)
Admission: RE | Disposition: A | Discharge status: Home / Self Care | Payer: Self-pay | Source: Ambulatory Visit | Attending: Urology

## 2016-04-17 DIAGNOSIS — I739 Peripheral vascular disease, unspecified: Secondary | ICD-10-CM | POA: Diagnosis not present

## 2016-04-17 DIAGNOSIS — Z8673 Personal history of transient ischemic attack (TIA), and cerebral infarction without residual deficits: Secondary | ICD-10-CM | POA: Diagnosis not present

## 2016-04-17 DIAGNOSIS — Z79899 Other long term (current) drug therapy: Secondary | ICD-10-CM | POA: Insufficient documentation

## 2016-04-17 DIAGNOSIS — I10 Essential (primary) hypertension: Secondary | ICD-10-CM | POA: Diagnosis not present

## 2016-04-17 DIAGNOSIS — Z7901 Long term (current) use of anticoagulants: Secondary | ICD-10-CM | POA: Insufficient documentation

## 2016-04-17 DIAGNOSIS — Z87891 Personal history of nicotine dependence: Secondary | ICD-10-CM | POA: Insufficient documentation

## 2016-04-17 DIAGNOSIS — G40909 Epilepsy, unspecified, not intractable, without status epilepticus: Secondary | ICD-10-CM | POA: Diagnosis not present

## 2016-04-17 DIAGNOSIS — Z9049 Acquired absence of other specified parts of digestive tract: Secondary | ICD-10-CM | POA: Insufficient documentation

## 2016-04-17 DIAGNOSIS — C672 Malignant neoplasm of lateral wall of bladder: Secondary | ICD-10-CM | POA: Insufficient documentation

## 2016-04-17 DIAGNOSIS — E785 Hyperlipidemia, unspecified: Secondary | ICD-10-CM | POA: Diagnosis not present

## 2016-04-17 DIAGNOSIS — Z951 Presence of aortocoronary bypass graft: Secondary | ICD-10-CM | POA: Insufficient documentation

## 2016-04-17 DIAGNOSIS — Z85828 Personal history of other malignant neoplasm of skin: Secondary | ICD-10-CM | POA: Insufficient documentation

## 2016-04-17 DIAGNOSIS — I2510 Atherosclerotic heart disease of native coronary artery without angina pectoris: Secondary | ICD-10-CM | POA: Insufficient documentation

## 2016-04-17 DIAGNOSIS — I252 Old myocardial infarction: Secondary | ICD-10-CM | POA: Insufficient documentation

## 2016-04-17 DIAGNOSIS — D494 Neoplasm of unspecified behavior of bladder: Secondary | ICD-10-CM | POA: Diagnosis present

## 2016-04-17 HISTORY — PX: TRANSURETHRAL RESECTION OF BLADDER TUMOR: SHX2575

## 2016-04-17 SURGERY — TURBT (TRANSURETHRAL RESECTION OF BLADDER TUMOR)
Anesthesia: General | Laterality: Left

## 2016-04-17 MED ORDER — SODIUM CHLORIDE 0.9 % IR SOLN
Status: DC | PRN
  Administered 2016-04-17: 6000 mL

## 2016-04-17 MED ORDER — ONDANSETRON HCL 4 MG/2ML IJ SOLN
INTRAMUSCULAR | Status: DC | PRN
  Administered 2016-04-17: 4 mg via INTRAVENOUS

## 2016-04-17 MED ORDER — CEFAZOLIN SODIUM-DEXTROSE 2-4 GM/100ML-% IV SOLN
2.0000 g | INTRAVENOUS | Status: AC
  Administered 2016-04-17: 2 g via INTRAVENOUS
  Filled 2016-04-17: qty 100

## 2016-04-17 MED ORDER — PROPOFOL 10 MG/ML IV BOLUS
INTRAVENOUS | Status: DC | PRN
  Administered 2016-04-17: 100 mg via INTRAVENOUS

## 2016-04-17 MED ORDER — CEFAZOLIN SODIUM-DEXTROSE 2-4 GM/100ML-% IV SOLN
INTRAVENOUS | Status: AC
  Filled 2016-04-17: qty 100

## 2016-04-17 MED ORDER — BELLADONNA ALKALOIDS-OPIUM 16.2-60 MG RE SUPP
RECTAL | Status: AC
  Filled 2016-04-17: qty 1

## 2016-04-17 MED ORDER — FENTANYL CITRATE (PF) 100 MCG/2ML IJ SOLN
INTRAMUSCULAR | Status: AC
  Filled 2016-04-17: qty 2

## 2016-04-17 MED ORDER — LACTATED RINGERS IV SOLN
INTRAVENOUS | Status: DC
  Administered 2016-04-17: 1000 mL via INTRAVENOUS

## 2016-04-17 MED ORDER — PROPOFOL 10 MG/ML IV BOLUS
INTRAVENOUS | Status: AC
  Filled 2016-04-17: qty 20

## 2016-04-17 MED ORDER — MIDAZOLAM HCL 2 MG/2ML IJ SOLN
INTRAMUSCULAR | Status: AC
  Filled 2016-04-17: qty 2

## 2016-04-17 MED ORDER — FENTANYL CITRATE (PF) 250 MCG/5ML IJ SOLN
INTRAMUSCULAR | Status: DC | PRN
  Administered 2016-04-17: 50 ug via INTRAVENOUS

## 2016-04-17 MED ORDER — GLYCOPYRROLATE 0.2 MG/ML IJ SOLN
INTRAMUSCULAR | Status: DC | PRN
  Administered 2016-04-17: 0.2 mg via INTRAVENOUS

## 2016-04-17 MED ORDER — PROMETHAZINE HCL 25 MG/ML IJ SOLN: 6.2500 mg | INTRAMUSCULAR | Status: DC | PRN

## 2016-04-17 MED ORDER — BELLADONNA ALKALOIDS-OPIUM 16.2-60 MG RE SUPP
RECTAL | Status: DC | PRN
  Administered 2016-04-17: 1 via RECTAL

## 2016-04-17 MED ORDER — LIDOCAINE 2% (20 MG/ML) 5 ML SYRINGE
INTRAMUSCULAR | Status: DC | PRN
  Administered 2016-04-17: 40 mg via INTRAVENOUS

## 2016-04-17 MED ORDER — LIDOCAINE HCL (CARDIAC) 20 MG/ML IV SOLN
INTRAVENOUS | Status: AC
  Filled 2016-04-17: qty 5

## 2016-04-17 MED ORDER — HYDROMORPHONE HCL 1 MG/ML IJ SOLN: 0.2500 mg | INTRAMUSCULAR | Status: DC | PRN

## 2016-04-17 SURGICAL SUPPLY — 14 items
BAG URINE DRAINAGE (UROLOGICAL SUPPLIES) ×2 IMPLANT
BAG URO CATCHER STRL LF (MISCELLANEOUS) ×2 IMPLANT
CATH SILASTIC FOLEY 20FRX5CC (CATHETERS) ×2 IMPLANT
GLOVE BIOGEL M STRL SZ7.5 (GLOVE) ×2 IMPLANT
GOWN STRL REUS W/TWL LRG LVL3 (GOWN DISPOSABLE) ×2 IMPLANT
GOWN STRL REUS W/TWL XL LVL3 (GOWN DISPOSABLE) ×2 IMPLANT
HOLDER FOLEY CATH W/STRAP (MISCELLANEOUS) ×2 IMPLANT
IV NS IRRIG 3000ML ARTHROMATIC (IV SOLUTION) ×4 IMPLANT
LOOP CUT BIPOLAR 24F LRG (ELECTROSURGICAL) ×2 IMPLANT
MANIFOLD NEPTUNE II (INSTRUMENTS) ×2 IMPLANT
NS IRRIG 1000ML POUR BTL (IV SOLUTION) IMPLANT
PACK CYSTO (CUSTOM PROCEDURE TRAY) ×2 IMPLANT
SYRINGE IRR TOOMEY STRL 70CC (SYRINGE) ×2 IMPLANT
TUBING CONNECTING 10 (TUBING) ×4 IMPLANT

## 2016-04-17 NOTE — Anesthesia Procedure Notes (Signed)
Procedure Name: LMA Insertion Date/Time: 04/17/2016 12:10 PM Performed by: Melina Copa, Latoshia Monrroy R Pre-anesthesia Checklist: Patient identified, Emergency Drugs available, Suction available and Patient being monitored Patient Re-evaluated:Patient Re-evaluated prior to inductionOxygen Delivery Method: Circle System Utilized Preoxygenation: Pre-oxygenation with 100% oxygen Intubation Type: IV induction Ventilation: Mask ventilation without difficulty LMA: LMA inserted and LMA with gastric port inserted LMA Size: 4.0 Number of attempts: 1 Airway Equipment and Method: Bite block Placement Confirmation: positive ETCO2 Tube secured with: Tape Dental Injury: Teeth and Oropharynx as per pre-operative assessment

## 2016-04-17 NOTE — Progress Notes (Signed)
[place] for [employer] to resume Aggrenox tomorrow 04/18/16 and ok to keep appt for 04/22/16 to have foley catheter removed per Dr. Gaynelle Arabian.

## 2016-04-17 NOTE — Transfer of Care (Signed)
Immediate Anesthesia Transfer of Care Note  Patient: [name]  Procedure(s) Performed: Procedure(s): TRANSURETHRAL RESECTION OF BLADDER TUMOR (TURBT) (Left)  Patient Location: PACU  Anesthesia Type:General  Level of Consciousness: awake, oriented and patient cooperative  Airway & Oxygen Therapy: Patient Spontanous Breathing and Patient connected to nasal cannula oxygen  Post-op Assessment: Report given to RN, Post -op Vital signs reviewed and stable and Patient moving all extremities  Post vital signs: Reviewed and stable  Last Vitals:  Filed Vitals:   04/17/16 0906 04/17/16 1250  BP: 125/61 127/71  Pulse: 58 65  Temp: 36.6 C 36.4 C  Resp: 18 16    Last Pain:  Filed Vitals:   04/17/16 1252  PainSc: 0-No pain      Patients Stated Pain Goal: 4 ([id number] [id number])  Complications: No apparent anesthesia complications

## 2016-04-17 NOTE — Interval H&P Note (Signed)
History and Physical Interval Note:  04/17/2016 11:22 AM  [name]  has presented today for surgery, with the diagnosis of LEFT BLADDER WALL TUMORS  The various methods of treatment have been discussed with the patient and family. After consideration of risks, benefits and other options for treatment, the patient has consented to  Procedure(s): TRANSURETHRAL RESECTION OF BLADDER TUMOR (TURBT) (Left) as a surgical intervention .  The patient's history has been reviewed, patient examined, no change in status, stable for surgery.  I have reviewed the patient's chart and labs.  Questions were answered to the patient's satisfaction.     [name] I Leontine Radman

## 2016-04-17 NOTE — Anesthesia Postprocedure Evaluation (Signed)
Anesthesia Post Note  Patient: [name]  Procedure(s) Performed: Procedure(s) (LRB): TRANSURETHRAL RESECTION OF BLADDER TUMOR (TURBT) (Left)  Patient location during evaluation: PACU Anesthesia Type: General Level of consciousness: sedated Pain management: pain level controlled Vital Signs Assessment: post-procedure vital signs reviewed and stable Respiratory status: spontaneous breathing and respiratory function stable Cardiovascular status: stable Anesthetic complications: no    Last Vitals:  Filed Vitals:   04/17/16 1315 04/17/16 1323  BP: 127/60 148/67  Pulse: 64 63  Temp:    Resp: 16 14    Last Pain:  Filed Vitals:   04/17/16 1332  PainSc: 1                  Qamar Aughenbaugh DANIEL

## 2016-04-17 NOTE — Anesthesia Preprocedure Evaluation (Addendum)
Anesthesia Evaluation  Patient identified by MRN, date of birth, ID band Patient awake    Reviewed: Allergy & Precautions, H&P , NPO status , Patient's Chart, lab work & pertinent test results  History of Anesthesia Complications Negative for: history of anesthetic complications  Airway Mallampati: II  TM Distance: >3 FB Neck ROM: full    Dental  (+) Dental Advisory Given, Edentulous Upper, Edentulous Lower   Pulmonary neg pulmonary ROS, former smoker,    Pulmonary exam normal breath sounds clear to auscultation       Cardiovascular hypertension, Pt. on medications and Pt. on home beta blockers + CAD, + CABG and + Peripheral Vascular Disease  Normal cardiovascular exam Rhythm:regular Rate:Normal     Neuro/Psych Seizures -, Well Controlled,  Parkinsonism. Carotid stenosis with CEA. Left spastic hemiparesis  Neuromuscular disease CVA, Residual Symptoms negative neurological ROS  negative psych ROS   GI/Hepatic negative GI ROS, Neg liver ROS,   Endo/Other  diabetes, Well Controlled, Type 2  Renal/GU negative Renal ROS  negative genitourinary   Musculoskeletal   Abdominal   Peds  Hematology negative hematology ROS (+)   Anesthesia Other Findings   Reproductive/Obstetrics negative OB ROS                            Anesthesia Physical  Anesthesia Plan  ASA: III  Anesthesia Plan: General   Post-op Pain Management:    Induction: Intravenous  Airway Management Planned: LMA  Additional Equipment:   Intra-op Plan:   Post-operative Plan: Extubation in OR  Informed Consent: I have reviewed the patients History and Physical, chart, labs and discussed the procedure including the risks, benefits and alternatives for the proposed anesthesia with the patient or authorized representative who has indicated his/her understanding and acceptance.   Dental Advisory Given  Plan Discussed with:  CRNA and Surgeon  Anesthesia Plan Comments:        Anesthesia Quick Evaluation

## 2016-04-17 NOTE — Op Note (Addendum)
Pre-operative diagnosis : Bladder tumor  Postoperative diagnosis: Bladder tumor  Operation: 1. Transurethral resection of bladder tumor, medium (2cm) 2. Simple Foley catheter placement  Surgeon:  S. Gaynelle Arabian, MD  First assistant: Virginia Crews, MD  Anesthesia:  general  Preparation:  The patient was identified in the preoperative voiding area and introduced and was correct. We have reviewed the plan for the procedure and he agreed to proceed with surgery. He was taken to the operating room where surgical timeout performed appropriate preoperative antibiotic given.  Review history: History of Present Illness 70 yo male returns today for a 4 mo cystoscopy for hx of bladder cancer. He is s/p BCG tx weekly x 6 (last on 12/27/15). He is s/p cysto/bilateral RPG/TURBT on 11/2//16 for bladder cancer. He has done very well since the surgery, with no more bleeding. He feels well, and is voiding well. He has a Hx of gross hematuria & he is currently on an anticoagulant. (Aggrenox). Previous hx of a PSA elevation of 3.83, then 5.28/11.9% noted by Dr. Stephanie Acre. He is no longer using Androgel. PSA 6.61/16% in Oct 2010. In the past, he didn't want to stop his Aggrenox for biopsy, and pharmacist "would not prescribe" Levaquin for him.   Statement of  Likelihood of Success: Excellent. TIME-OUT observed.:  Procedure: We brought the patient to the operating room and general anesthesia was induced. He was given preoperative antibiotics with Ancef. We then repositioned into dorsal lithotomy position. A surgical timeout was performed. We then inserted a 68 French rigid resectoscope visual obturator using lubrication and saline irrigation running. We did visualize a 2cm papillary tumor at the left lateral wall but did not visualize any other tumors, even with a 70 lens. We then switched to our [place] working element with a bipolar electrocautery loop. We resected tumor down to muscularis propria. We then fulgurated  the base and ensured hemostasis with irrigation off. Both ureteral orifices were visualized post resection and were not uninvolved. Within the bladder full and inserted a Foley catheter with 10 mL of sterile water in the balloon. The Foley was placed to straight drainage with clear urine and the procedure was concluded. The patient was taken to the recovery area.  Urology Attending Note: I was present for, and participated in  All aspects of this patinet's operative care.

## 2016-04-22 ENCOUNTER — Encounter: Payer: Self-pay | Admitting: Diagnostic Neuroimaging

## 2016-04-22 ENCOUNTER — Ambulatory Visit (INDEPENDENT_AMBULATORY_CARE_PROVIDER_SITE_OTHER): Payer: Medicare Other | Admitting: Diagnostic Neuroimaging

## 2016-04-22 VITALS — BP 151/74 | HR 63 | Ht 67.0 in | Wt 177.0 lb

## 2016-04-22 DIAGNOSIS — G40909 Epilepsy, unspecified, not intractable, without status epilepticus: Secondary | ICD-10-CM | POA: Diagnosis not present

## 2016-04-22 DIAGNOSIS — Z8673 Personal history of transient ischemic attack (TIA), and cerebral infarction without residual deficits: Secondary | ICD-10-CM

## 2016-04-22 DIAGNOSIS — I6930 Unspecified sequelae of cerebral infarction: Secondary | ICD-10-CM

## 2016-04-22 DIAGNOSIS — G20 Parkinson's disease: Secondary | ICD-10-CM

## 2016-04-22 DIAGNOSIS — G250 Essential tremor: Secondary | ICD-10-CM

## 2016-04-22 MED ORDER — CARBIDOPA-LEVODOPA 25-100 MG PO TABS: 1.0000 | ORAL_TABLET | Freq: Three times a day (TID) | ORAL | Status: DC

## 2016-04-22 NOTE — Progress Notes (Signed)
[facility]  PATIENT: [name] DOB: 09/06/1946  REFERRING CLINICIAN:  HISTORY FROM: patient and wife REASON FOR VISIT: follow up   HISTORICAL  CHIEF COMPLAINT:  Chief Complaint  Patient presents with  . Chronic ischemic right MCA stroke    rm 7, care giver Jacqulyn, "Carbo-Levo not helpful"  . Follow-up    6 month  . Multiple Sclerosis    HISTORY OF PRESENT ILLNESS:   UPDATE 04/22/16: Since last visit, postural tremor is slightly worse; resting tremor is stable. More freezing with gait initiation. No falls. Had 1 more bladder cancer surgery on 04/17/16.   UPDATE 10/23/15: Since last visit, overall stable; but unfortunately dx'd with bladder CA, s/p resection. Stable on meds. Tremor stable.   UPDATE 04/19/15: Since last visit, doing better. Less choking on saliva. Postural and action tremor stable. Balance stable. Mainly with postural and action tremor.   UPDATE 12/18/14: Since last visit, stable. On carb/levo half tab TID. May have had some improvement in tremor. Still using single point cane. No falls since last visit.   UPDATE 09/13/14: Since last visit, symptoms are stable. Still falling. More problems due to right sided slowness and chronic left hemiparesis. Asking about parkinson's dz meds, diagnosis, treatment, supplements.   UPDATE 03/08/14: Since last visit, no seizures. Has been falling more. Has noticed hand writing deterioration over past 3 years. Also intermittent right hand tremor x 3-6 months (per wife). Patient noted some right hand tremor even in 1999.  UPDATE 03/08/13: Since last visit patient doing well. No seizures since 2005. Patient is stable on Dilantin and Keppra brand name medications. Patient's swallowing has improved since last visit. He make sure to take liquids following each mouthful of food.  PRIOR HPI (08/27/12, Dr. Erling Cruz): 70 year old right-handed white divorced male from [place], [place] who is cared for by his  ex-first wife. He has a known history of hypertension, right MCA stroke 3/99 associated with residual left hemiparesis, acute right MCA stroke 09/2003,coronary artery disease status post 3 vessel bypass in 2004, bilateral carotid endarterectomies in 1999 and bilateral stent placement 5/12, recurrent TIAs versus seizures, documented seizure 11/12/2006, and Dilantin toxicity. His last MRI of the brain without contrast 11/13/2006 showed a large chronic right middle artery infarct involving the posterior temporal and parietal lobe and right thalamus. MR angiogram of the head without contrast showed intracranial atherosclerotic disease and occlusion of the right middle cerebral artery branch corresponding to the right MCA infarct. There was mild atherosclerotic vascular disease elsewhere and no aneurysm. Doppler study of the carotids 3/5/009 was negative for hemodynamically significant stenosis in the intracranial arteries.  EEG 11/13/2006 showed rhythmic discharges correlating with the EKG and were thought to be artifact. . His last seizure was 11/18/2006. He subsequently had one episode of confusion 11/26/2009 which might have been a seizure. He was at his son's house, came home, looked in the  driveway, and asked where his car was. His car had been given to someone else in the family. He kept asking about the car and recognized that he was confused. The episode lasted 40 minutes. The patient lives with his caretaker. He is able to bathe himself and  take care of his toileting needs, but requires assistance in dressing. He  exercises at the [facility] 2 days per week. He has falls with a fall assessment tool score of 10. He denies macropsia, micropsia, strange odors or tastes. His caretaker reports that at times he has left knee hyperextension. He  uses a left leg brace. He denies TIA symptoms or warnings of seizures.He has finished 2 months of PT in no rehabilitation and has a new left foot brace, which is not associated with  hyperextension of his knee. He denies spasticity or spasms. He enjoys playing "We" 2 times per day for exercise.His wife reports that he chokes on his saliva. He has not had  unexplained fever or chills. Last trough levetiracetam level 15.9  and phenytoin level 13.4 on 03/11/2012.  REVIEW OF SYSTEMS: Full 14 system review of systems performed and notable only for tremors and walking difficulty.    ALLERGIES: Allergies  Allergen Reactions  . Altace [Ramipril] Cough    HOME MEDICATIONS: Outpatient Prescriptions Prior to Visit  Medication Sig Dispense Refill  . AGGRENOX 25-200 MG per 12 hr capsule TAKE 1 CAPSULE BY MOUTH 2 (TWO) TIMES DAILY. 180 capsule 4  . BENICAR 20 MG tablet TAKE 1 TABLET BY MOUTH DAILY 90 tablet 1  . cholecalciferol (VITAMIN D) 1000 units tablet Take 1,000 Units by mouth daily with breakfast.    . Coenzyme Q10 (COQ-10) 50 MG CAPS Take 1 capsule by mouth 2 (two) times daily.    . CRESTOR 20 MG tablet Take 1.5 tablets (30 mg total) by mouth daily. <PLEASE MAKE APPOINTMENT FOR REFILLS> 45 tablet 0  . dantrolene (DANTRIUM) 50 MG capsule TAKE 1 CAPSULE BY MOUTH TWICE DAILY 180 capsule 0  . DILANTIN 100 MG ER capsule Take 1 capsule (100 mg total) by mouth 3 (three) times daily. 270 capsule 4  . fenofibrate (TRICOR) 48 MG tablet TAKE 1 TABLET BY MOUTH DAILY (Patient taking differently: TAKE 1 TABLET BY MOUTH DAILY-- takes in pm) 90 tablet 3  . Flaxseed, Linseed, 1000 MG CAPS Take 1 capsule by mouth every evening.     Marland Kitchen FOLBIC 2.5-25-2 MG TABS TAKE 1 TABLET BY MOUTH DAILY. (Patient taking differently: TAKE 1 TABLET BY MOUTH DAILY.  takes in pm) 90 tablet 4  . KEPPRA 500 MG tablet Take 1 tablet (500 mg total) by mouth 2 (two) times daily. 180 tablet 4  . metoprolol tartrate (LOPRESSOR) 25 MG tablet TAKE 1 TABLET (25 MG TOTAL) BY MOUTH DAILY. (Patient taking differently: TAKE 1 TABLET (25 MG TOTAL) BY MOUTH DAILY.--  takes in bedtime) 90 tablet 1  . nitroGLYCERIN (NITROSTAT) 0.4 MG  SL tablet Place 1 tablet (0.4 mg total) under the tongue every 5 (five) minutes as needed for chest pain. 25 tablet 3  . phenazopyridine (PYRIDIUM) 200 MG tablet Take 1 tablet (200 mg total) by mouth 3 (three) times daily as needed for pain. 10 tablet 0  . traMADol-acetaminophen (ULTRACET) 37.5-325 MG tablet Take 1 tablet by mouth every 6 (six) hours as needed for severe pain. 30 tablet 0  . vitamin C (ASCORBIC ACID) 500 MG tablet Take 500 mg by mouth every evening.     Marland Kitchen ZETIA 10 MG tablet TAKE 1 TABLET (10 MG TOTAL) BY MOUTH DAILY. 90 tablet 2  . carbidopa-levodopa (SINEMET IR) 25-100 MG tablet Take 1 tablet by mouth 3 (three) times daily. 270 tablet 4  . hyoscyamine (LEVSIN/SL) 0.125 MG SL tablet Place 1 tablet (0.125 mg total) under the tongue every 4 (four) hours as needed for cramping. 30 tablet 6  . phenazopyridine (PYRIDIUM) 200 MG tablet Take 1 tablet (200 mg total) by mouth 3 (three) times daily as needed for pain. (Patient not taking: Reported on 04/04/2016) 10 tablet 0  . trimethoprim (TRIMPEX) 100 MG tablet Take  1 tablet (100 mg total) by mouth 1 day or 1 dose. (Patient not taking: Reported on 04/04/2016) 30 tablet 1   No facility-administered medications prior to visit.    PAST MEDICAL HISTORY: Past Medical History  Diagnosis Date  . Hyperlipidemia   . Hypertension   . Seizure disorder [facility]) last seizure 2011 due to confusion    neurologist-  dr Leta Baptist-- per note seizure documented 2008 breakthrough partial complex seizure (prior tonic-clonic seizure's post CVA)  . Pancreatic pseudocyst   . History of CVA with residual deficit neurologist-  dr Leta Baptist    3/ 1999  Right MCA  and  12/ 2004  Anterior division of  Right MCA ---  residual left spactic hemiparesis and left foot drop  . History of MI (myocardial infarction)     02/ 2004   s/p  cabg   . S/P CABG x 4     02/ 2004  . Left spastic hemiparesis ([place])     residual CVA 1999  . Left foot drop     residual from CVA  --  wears brace  . Hemiparkinsonism [facility]) neurologist-- dr Leta Baptist    right side body  . At high risk for falls   . Hyperextension deformity of left knee     wears brace  . HOH (hard of hearing)     LEFT EAR  POST cva  . Visual neglect     LEFT EYE  . Type 2 diabetes, diet controlled ([place])   . Gait disturbance, post-stroke     uses cane, rollator, and w/c long distance  . History of carotid artery stenosis cardiologist-- dr berry    bilateral --  1999 s/p  bilateral ICA endarterectomy and recurrent restenosis 2012  s/p  staged bilateral stenting   per last duplex 2015  stents widely patent  . Peripheral arterial disease ([place])   . Cerebrovascular arteriosclerosis   . History of basal cell carcinoma excision     Sept 2016--  MOH's surgery side of nose  . Bladder cancer [facility])     urologist-  dr Gaynelle Arabian  . CAD (coronary artery disease) cardiologist-  dr berry    PAST SURGICAL HISTORY: Past Surgical History  Procedure Laterality Date  . Cardiac catheterization  11/17/2002    significant left main disease and 3 vessel CAD, mildly depressed LV systolic  fx, 123456 left renal artery stenosis  . Carotid endarterectomy  12/1997   [facility])    Bilateral ICA  . US echocardiography  01/03/2011    normal LVF, ef>55%/  mild LAE/ trace MR and TR,  mild AV sclerosis without stenosis  . Nm myocar perf wall motion  01/09/2011   dr berry    mild to mod. perfusion defect in the basal inferolateral & mid inferolaterl regions consistant with an infarct/scar, no signigicant ischemia demonstracted/  Low Risk scan, no sig. change from previous study/  normal LV function and wall motion , ef 63%  . Carotid stent insertion Bilateral dr berry and dr Kristine Royal--  (In-stent Restenosis)  LeftICA   02-12-2011;  RightICA   02-26-2011  . Coronary artery bypass graft  2/ 2004   dr hendrickson    x3 LIMA to LAD,free right internal mammary artery to obtuse marginal 1,SVG to distal right coronary.  . Orif right  ankle fx  1998    hardware retained  . Laparoscopic cholecystectomy  1998  . Transurethral resection of bladder tumor N/A 09/10/2015    Procedure:  CYSTO TRANSURETHRAL RESECTION OF BLADDER TUMOR (TURBT) OF LEFT POSTERIOR BLADDER TUMOR 2 CM;  Surgeon: Carolan Clines, MD;  Location: [facility];  Service: Urology;  Laterality: N/A;  . Cystoscopy w/ retrogrades Bilateral 09/10/2015    Procedure: CYSTOSCOPY WITH RETROGRADE PYELOGRAM;  Surgeon: Carolan Clines, MD;  Location: [place];  Service: Urology;  Laterality: Bilateral;  . Cystoscopy with urethral dilatation N/A 09/10/2015    Procedure: CYSTOSCOPY WITH URETHRAL DILATATION;  Surgeon: Carolan Clines, MD;  Location: [facility];  Service: Urology;  Laterality: N/A;  . Cystoscopy with biopsy N/A 09/10/2015    Procedure: CYSTOSCOPY WITH BIOPSY,RIGHT TRIGONAL TUMOR 1.5 CM, EXCISIONAL BIOPSY WITH TAUBER FORCEP RIGHT POSTERIOR BLADDER WALL 1 CM, EXCISIONAL BIOPSY SATELITE BLADDER WALL 1 CM;  Surgeon: Carolan Clines, MD;  Location: [facility];  Service: Urology;  Laterality: N/A;  . Mohs surgery  06-20-2015    side of nose  . Cystoscopy with hydrodistension and biopsy N/A 10/29/2015    Procedure: REPEAT CYSTOSCOPY BIOPSY BLADDER DEEP MUSCLES BLADDER BIOPSY;  Surgeon: Carolan Clines, MD;  Location: [place];  Service: Urology;  Laterality: N/A;  . Transurethral resection of bladder tumor Left 04/17/2016    Procedure: TRANSURETHRAL RESECTION OF BLADDER TUMOR (TURBT);  Surgeon: Carolan Clines, MD;  Location: [facility];  Service: Urology;  Laterality: Left;    FAMILY HISTORY: Family History  Problem Relation Age of Onset  . Stroke Father   . Heart attack Father   . Diabetes Brother   . Hyperlipidemia Brother   . Hypertension Brother   . Hyperlipidemia Sister   . Hypertension Sister   . Stroke Mother     SOCIAL HISTORY:  Social History   Social  History  . Marital Status: Divorced    Spouse Name: N/A  . Number of Children: 4  . Years of Education: HS   Occupational History  .     Social History Main Topics  . Smoking status: Former Smoker -- 5 years    Types: Cigarettes    Quit date: 11/14/2007  . Smokeless tobacco: Never Used  . Alcohol Use: No  . Drug Use: No  . Sexual Activity: Not on file   Other Topics Concern  . Not on file   Social History Narrative   Patient lives at home with his caregiver/ex-wife.   Caffeine- 2 cups of coffee daily     PHYSICAL EXAM  Filed Vitals:   04/22/16 1253  BP: 151/74  Pulse: 63  Height: 5\' 7"  (1.702 m)  Weight: 177 lb (80.287 kg)    Not recorded      Body mass index is 27.72 kg/(m^2).  GENERAL EXAM: Patient is in no distress; MASKED FACIES  CARDIOVASCULAR: Regular rate and rhythm, no murmurs, no carotid bruits  NEUROLOGIC: MENTAL STATUS: awake, alert, SLIGHTLY DECR FLUENCY. Comprehension intact, naming intact CRANIAL NERVE: pupils equal and reactive to light, visual fields full to confrontation, extraocular muscles intact, no nystagmus, facial sensation symmetric; DECR LEFT LOWER FACIAL STRENGTH. MILD DYSARTHRIA uvula midline, shoulder shrug symmetric, tongue midline. MOTOR: INCR TONE IN LUE AND LLE. LUE 1-2/5 PROX, 0 DISTAL. LLE 3/5 WITH FOOT DROP. VERY RARE REST TREMOR IN RUE. MILD RUE POSTURAL TREMOR. MILD BRADYKINESIA WITH RUE AND RLE.  SENSORY: DECR ON LEFT SIDE COORDINATION: finger-nose-finger, fine finger movements SLOW ON RIGHT. LIMITED ON LEFT DUE TO WEAKNESS. REFLEXES: BRISK IN LUE AND LLE. GAIT/STATION: LEFT HEMIPARIETIC GAIT; USES CANE; HESITATION AND FREEZING WITH LEFT LEG INITIATION  DIAGNOSTIC DATA (LABS, IMAGING, TESTING) - I reviewed patient records, labs, notes, testing and imaging myself where available.  Lab Results  Component Value Date   WBC 7.4 04/10/2016   HGB 13.8 04/10/2016   HCT 41.9 04/10/2016   MCV 99.1 04/10/2016   PLT 196  04/10/2016      Component Value Date/Time   NA 138 04/10/2016 1145   K 6.1* 04/10/2016 1145   CL 107 04/10/2016 1145   CO2 26 04/10/2016 1145   GLUCOSE 151* 04/10/2016 1145   BUN 29* 04/10/2016 1145   CREATININE 1.15 04/10/2016 1145   CREATININE 1.11 07/13/2013 0814   CALCIUM 9.8 04/10/2016 1145   PROT 6.5 04/10/2015 0846   ALBUMIN 3.9 04/10/2015 0846   AST 18 04/10/2015 0846   ALT 13 04/10/2015 0846   ALKPHOS 63 04/10/2015 0846   BILITOT 0.3 04/10/2015 0846   GFRNONAA >60 04/10/2016 1145   GFRAA >60 04/10/2016 1145   Lab Results  Component Value Date   CHOL 143 04/10/2015   HDL 33* 04/10/2015   LDLCALC 69 04/10/2015   TRIG 205* 04/10/2015   CHOLHDL 4.3 04/10/2015   Lab Results  Component Value Date   HGBA1C 6.5* 05/23/2011   Lab Results  Component Value Date   VITAMINB12 1948* 05/23/2011   Lab Results  Component Value Date   TSH 3.245 07/13/2013    10/19/14 MRI BRAIN 1. Chronic right hemispheric ischemic infarction. 2. Mild chronic small vessel ischemic disease. 3. No acute findings. No change from MRI on 11/13/06.    ASSESSMENT AND PLAN  70 y.o. year old male  has a past medical history of Hyperlipidemia; Hypertension; Seizure disorder ([place]) (last seizure 2011 due to confusion); Pancreatic pseudocyst; History of CVA with residual deficit (neurologist-  dr Leta Baptist); History of MI (myocardial infarction); S/P CABG x 4; Left spastic hemiparesis ([place]); Left foot drop; Hemiparkinsonism [facility]) (neurologist-- dr Leta Baptist); At high risk for falls; Hyperextension deformity of left knee; HOH (hard of hearing); Visual neglect; Type 2 diabetes, diet controlled ([place]); Gait disturbance, post-stroke; History of carotid artery stenosis (cardiologist-- dr berry); Peripheral arterial disease ([place]); Cerebrovascular arteriosclerosis; History of basal cell carcinoma excision; Bladder cancer ([place]); and CAD (coronary artery disease) (cardiologist-  dr berry). here with history of right  MCA stroke (1999) and seizure disorder. Patient is doing well on brand name Keppra and Dilantin. Now with right body hemiparkinsonism since 2013. May also have had essential tremor since the 1960's (in retrospect).   Dx:  Parkinsonism, unspecified Parkinsonism type ([place])  Chronic ischemic right MCA stroke  Seizure disorder ([place])  Essential tremor    PLAN: - continue brand name dilantin and keppra for seizure control - increase carb/levo up to 1.5 or 2 tabs TID for parkinsonism symptoms - may consider primidone in future for essential tremor, but difficulty due to polypharmacy - high fall risk due to combination of chronic right MCA stroke and right body hemiparkinsonism; use wheelchair and rollator walker with seat  Meds ordered this encounter  Medications  . carbidopa-levodopa (SINEMET IR) 25-100 MG tablet    Sig: Take 1-2 tablets by mouth 3 (three) times daily.    Dispense:  540 tablet    Refill:  4   Return in about 6 months (around 10/23/2016).     Penni Bombard, MD [id number], [id number] PM Certified in Neurology, Neurophysiology and Neuroimaging  [facility] [address] [place], [place] [address] [phone]

## 2016-04-22 NOTE — Patient Instructions (Addendum)
-   increase carb/levo up to 1.5 tabs three times per day; may also increase up to 2 tabs three times per day after 2-4 weeks

## 2016-06-15 ENCOUNTER — Other Ambulatory Visit: Payer: Self-pay | Admitting: Cardiovascular Disease

## 2016-06-15 ENCOUNTER — Other Ambulatory Visit: Payer: Self-pay | Admitting: Diagnostic Neuroimaging

## 2016-06-17 NOTE — Telephone Encounter (Signed)
Spoke to patient's caregiver, [name] who stated patient takes Folbic nightly, confirmed pharmacy of choice, will refill. She then stated he was put on Cephalexin for bacterial bladder infection , but it did not cure infection . He was then put on Bactrim DS which he will complete on 03/20/16. She inquired if his Dilantin level should be checked because pharmacist told her the medication can change his dilantin level. Advised this RN will route her question to Dr Leta Baptist and call her back tomorrow with his reply. She is aware patient would come into office for lab only during regular lab hours. She verbalized understanding, appreciation.

## 2016-06-17 NOTE — Telephone Encounter (Signed)
Ask patient if he still wants this. If yes, then we can refill. -VRP

## 2016-06-17 NOTE — Telephone Encounter (Signed)
Combination could slightly increase dilantin level, but should be ok for short course of abx, because we are not likely to change dilantin dose over short term. -VRP

## 2016-06-18 NOTE — Telephone Encounter (Addendum)
Addendum/correction: patient to complete Bactrim DS on 06/20/16, not 03/20/16 as previously documented.  Per Dr Leta Baptist, spoke with [name] and advised that Dr Leta Baptist would not change his dilantin level for short time. Also advised that dilantin level could slightly increase but should be okay for short course of antibiotics. She inquired about the lab; informed her that if his seizures are well controlled over period of time, Dr Leta Baptist would not necessarily get labs with every follow up. She  stated that his seizures have been well controlled for a long time on current medications and doses.  She verbalized understanding, appreciation for call back.

## 2016-06-22 ENCOUNTER — Other Ambulatory Visit: Payer: Self-pay | Admitting: Diagnostic Neuroimaging

## 2016-07-13 ENCOUNTER — Other Ambulatory Visit: Payer: Self-pay | Admitting: Diagnostic Neuroimaging

## 2016-07-14 ENCOUNTER — Other Ambulatory Visit: Payer: Self-pay | Admitting: Diagnostic Neuroimaging

## 2016-08-05 ENCOUNTER — Telehealth: Payer: Self-pay | Admitting: Cardiovascular Disease

## 2016-08-05 MED ORDER — CRESTOR 20 MG PO TABS: 30.0000 mg | ORAL_TABLET | Freq: Every day | ORAL | 0 refills | Status: DC

## 2016-08-05 NOTE — Telephone Encounter (Signed)
spoKe to [facility] 90 DAY SUPPLY E-SENT WITH NO REFILLS VERBALIZED UNDERSTANDING    PREFER BRAND NAME ONLY- WIFE STATES PATIENT HAS HAD ISSUES IN THE PAST WITH OTHER MEDICATION

## 2016-08-05 NOTE — Telephone Encounter (Signed)
New Message   *STAT* If patient is at the pharmacy, call can be transferred to refill team.   1. Which medications need to be refilled? (please list name of each medication and dose if known) crestor 20mg    2. Which pharmacy/location (including street and city if local pharmacy) is medication to be sent to? [facility] [place]  3. Do they need a 30 day or 90 day supply? Per pt wife would like 90 supply. Please call back to discuss

## 2016-08-13 ENCOUNTER — Other Ambulatory Visit: Payer: Self-pay | Admitting: Cardiovascular Disease

## 2016-08-19 ENCOUNTER — Encounter: Payer: Medicare Other | Attending: Physical Medicine & Rehabilitation | Admitting: Physical Medicine & Rehabilitation

## 2016-08-19 ENCOUNTER — Encounter: Payer: Self-pay | Admitting: Physical Medicine & Rehabilitation

## 2016-08-19 ENCOUNTER — Encounter (INDEPENDENT_AMBULATORY_CARE_PROVIDER_SITE_OTHER): Payer: Self-pay

## 2016-08-19 VITALS — BP 156/82 | HR 66 | Resp 14

## 2016-08-19 DIAGNOSIS — Z951 Presence of aortocoronary bypass graft: Secondary | ICD-10-CM | POA: Diagnosis not present

## 2016-08-19 DIAGNOSIS — Z9049 Acquired absence of other specified parts of digestive tract: Secondary | ICD-10-CM | POA: Diagnosis not present

## 2016-08-19 DIAGNOSIS — Z823 Family history of stroke: Secondary | ICD-10-CM | POA: Diagnosis not present

## 2016-08-19 DIAGNOSIS — Z8551 Personal history of malignant neoplasm of bladder: Secondary | ICD-10-CM | POA: Insufficient documentation

## 2016-08-19 DIAGNOSIS — I252 Old myocardial infarction: Secondary | ICD-10-CM | POA: Diagnosis not present

## 2016-08-19 DIAGNOSIS — G40909 Epilepsy, unspecified, not intractable, without status epilepticus: Secondary | ICD-10-CM | POA: Diagnosis not present

## 2016-08-19 DIAGNOSIS — I2510 Atherosclerotic heart disease of native coronary artery without angina pectoris: Secondary | ICD-10-CM | POA: Insufficient documentation

## 2016-08-19 DIAGNOSIS — G20A1 Parkinson's disease without dyskinesia, without mention of fluctuations: Secondary | ICD-10-CM

## 2016-08-19 DIAGNOSIS — Z8249 Family history of ischemic heart disease and other diseases of the circulatory system: Secondary | ICD-10-CM | POA: Diagnosis not present

## 2016-08-19 DIAGNOSIS — Z8673 Personal history of transient ischemic attack (TIA), and cerebral infarction without residual deficits: Secondary | ICD-10-CM | POA: Diagnosis not present

## 2016-08-19 DIAGNOSIS — Z9889 Other specified postprocedural states: Secondary | ICD-10-CM | POA: Diagnosis not present

## 2016-08-19 DIAGNOSIS — Z8342 Family history of familial hypercholesterolemia: Secondary | ICD-10-CM | POA: Diagnosis not present

## 2016-08-19 DIAGNOSIS — Z85828 Personal history of other malignant neoplasm of skin: Secondary | ICD-10-CM | POA: Diagnosis not present

## 2016-08-19 DIAGNOSIS — I219 Acute myocardial infarction, unspecified: Secondary | ICD-10-CM | POA: Diagnosis not present

## 2016-08-19 DIAGNOSIS — Z833 Family history of diabetes mellitus: Secondary | ICD-10-CM | POA: Diagnosis not present

## 2016-08-19 DIAGNOSIS — Z87891 Personal history of nicotine dependence: Secondary | ICD-10-CM | POA: Insufficient documentation

## 2016-08-19 DIAGNOSIS — G8110 Spastic hemiplegia affecting unspecified side: Secondary | ICD-10-CM

## 2016-08-19 DIAGNOSIS — G20 Parkinson's disease: Secondary | ICD-10-CM | POA: Diagnosis not present

## 2016-08-19 NOTE — Progress Notes (Signed)
Subjective:    Patient ID: [name], male    DOB: [birth year], 70 y.o.   MRN: [id number]  HPI   [name] is here in follow up of his CVA and  chronic spastic hemiparesis. He is using a rollator for ambulation. He is rarely using his cane any more due to his coordination. He hasn't had any falls and has been trying to use good safety awareness at home. His wife also helps him a great deal.  Neurology just increased his sinemet to 2 tabs TID which has helped his rigidity.    He had surgeries for his bladder cancer over the summer and currently he told me he has no active cancer.    Pain Inventory Average Pain 0 Pain Right Now 0 My pain is no pain  In the last 24 hours, has pain interfered with the following? General activity 0 Relation with others 0 Enjoyment of life 0 What TIME of day is your pain at its worst? no pain Sleep (in general) Good  Pain is worse with: no pain Pain improves with: no pain Relief from Meds: no pain  Mobility walk with assistance use a cane use a walker how many minutes can you walk? 4-5 ability to climb steps?  yes do you drive?  yes use a wheelchair transfers alone  Function retired I need assistance with the following:  meal prep, household duties and shopping  Neuro/Psych tremor trouble walking  Prior Studies Any changes since last visit?  no  Physicians involved in your care Any changes since last visit?  no   Family History  Problem Relation Age of Onset  . Stroke Father   . Heart attack Father   . Stroke Mother   . Diabetes Brother   . Hyperlipidemia Brother   . Hypertension Brother   . Hyperlipidemia Sister   . Hypertension Sister    Social History   Social History  . Marital status: Divorced    Spouse name: N/A  . Number of children: 4  . Years of education: HS   Occupational History  .  Retired   Social History Main Topics  . Smoking status: Former Smoker    Years: 5.00    Types: Cigarettes    Quit date: 11/14/2007  . Smokeless tobacco: Never Used  . Alcohol use No  . Drug use: No  . Sexual activity: Not Asked   Other Topics Concern  . None   Social History Narrative   Patient lives at home with his caregiver/ex-wife.   Caffeine- 2 cups of coffee daily   Past Surgical History:  Procedure Laterality Date  . CARDIAC CATHETERIZATION  11/17/2002   significant left main disease and 3 vessel CAD, mildly depressed LV systolic  fx, 123456 left renal artery stenosis  . CAROTID ENDARTERECTOMY  12/1997   [facility])   Bilateral ICA  . CAROTID STENT INSERTION Bilateral dr berry and dr Kristine Royal--  (In-stent Restenosis)  LeftICA   02-12-2011;  [place]   02-26-2011  . CORONARY ARTERY BYPASS GRAFT  2/ 2004   dr hendrickson   x3 LIMA to LAD,free right internal mammary artery to obtuse marginal 1,SVG to distal right coronary.  . CYSTOSCOPY W/ RETROGRADES Bilateral 09/10/2015   Procedure: CYSTOSCOPY WITH RETROGRADE PYELOGRAM;  Surgeon: Carolan Clines, MD;  Location: [facility];  Service: Urology;  Laterality: Bilateral;  . CYSTOSCOPY WITH BIOPSY N/A 09/10/2015   Procedure: CYSTOSCOPY WITH BIOPSY,RIGHT TRIGONAL TUMOR 1.5 CM, EXCISIONAL BIOPSY  WITH TAUBER FORCEP RIGHT POSTERIOR BLADDER WALL 1 CM, EXCISIONAL BIOPSY SATELITE BLADDER WALL 1 CM;  Surgeon: Carolan Clines, MD;  Location: [facility];  Service: Urology;  Laterality: N/A;  . CYSTOSCOPY WITH HYDRODISTENSION AND BIOPSY N/A 10/29/2015   Procedure: REPEAT CYSTOSCOPY BIOPSY BLADDER DEEP MUSCLES BLADDER BIOPSY;  Surgeon: Carolan Clines, MD;  Location: [place];  Service: Urology;  Laterality: N/A;  . CYSTOSCOPY WITH URETHRAL DILATATION N/A 09/10/2015   Procedure: CYSTOSCOPY WITH URETHRAL DILATATION;  Surgeon: Carolan Clines, MD;  Location: [place];  Service: Urology;  Laterality: N/A;  . [place]  . MOHS SURGERY  06-20-2015   side of  nose  . NM MYOCAR PERF WALL MOTION  01/09/2011   dr berry   mild to mod. perfusion defect in the basal inferolateral & mid inferolaterl regions consistant with an infarct/scar, no signigicant ischemia demonstracted/  Low Risk scan, no sig. change from previous study/  normal LV function and wall motion , ef 63%  . ORIF RIGHT ANKLE FX  1998   hardware retained  . TRANSURETHRAL RESECTION OF BLADDER TUMOR N/A 09/10/2015   Procedure: CYSTO TRANSURETHRAL RESECTION OF BLADDER TUMOR (TURBT) OF LEFT POSTERIOR BLADDER TUMOR 2 CM;  Surgeon: Carolan Clines, MD;  Location: [facility];  Service: Urology;  Laterality: N/A;  . TRANSURETHRAL RESECTION OF BLADDER TUMOR Left 04/17/2016   Procedure: TRANSURETHRAL RESECTION OF BLADDER TUMOR (TURBT);  Surgeon: Carolan Clines, MD;  Location: [facility];  Service: Urology;  Laterality: Left;  . US ECHOCARDIOGRAPHY  01/03/2011   normal LVF, ef>55%/  mild LAE/ trace MR and TR,  mild AV sclerosis without stenosis   Past Medical History:  Diagnosis Date  . At high risk for falls   . Bladder cancer [facility])    urologist-  dr Gaynelle Arabian  . CAD (coronary artery disease) cardiologist-  dr berry  . Cerebrovascular arteriosclerosis   . Gait disturbance, post-stroke    uses cane, rollator, and w/c long distance  . Hemiparkinsonism [facility]) neurologist-- dr Leta Baptist   right side body  . History of basal cell carcinoma excision    Sept 2016--  MOH's surgery side of nose  . History of carotid artery stenosis cardiologist-- dr berry   bilateral --  1999 s/p  bilateral ICA endarterectomy and recurrent restenosis 2012  s/p  staged bilateral stenting   per last duplex 2015  stents widely patent  . History of CVA with residual deficit neurologist-  dr Leta Baptist   3/ 1999  Right MCA  and  12/ 2004  Anterior division of  Right MCA ---  residual left spactic hemiparesis and left foot drop  . History of MI (myocardial infarction)    02/ 2004   s/p  cabg   . HOH (hard of  hearing)    LEFT EAR  POST cva  . Hyperextension deformity of left knee    wears brace  . Hyperlipidemia   . Hypertension   . Left foot drop    residual from CVA  -- wears brace  . Left spastic hemiparesis ([place])    residual CVA 1999  . Pancreatic pseudocyst   . Peripheral arterial disease ([place])   . S/P CABG x 4    02/ 2004  . Seizure disorder [facility]) last seizure 2011 due to confusion   neurologist-  dr Leta Baptist-- per note seizure documented 2008 breakthrough partial complex seizure (prior tonic-clonic seizure's post CVA)  . Type 2 diabetes, diet controlled (  [place])   . Visual neglect    LEFT EYE   BP (!) 156/82 (BP Location: Left Arm, Patient Position: Sitting, Cuff Size: Large)   Pulse 66   Resp 14   SpO2 97%   Opioid Risk Score:   Fall Risk Score:  `1  Depression screen PHQ 2/9  No flowsheet data found.  Review of Systems  Constitutional: Negative.   HENT: Negative.   Eyes: Negative.   Respiratory: Negative.   Cardiovascular: Negative.   Gastrointestinal: Negative.   Endocrine: Negative.   Genitourinary: Negative.   Musculoskeletal: Negative.   Skin: Negative.   Allergic/Immunologic: Negative.   Neurological: Positive for tremors.  Hematological: Bruises/bleeds easily.  Psychiatric/Behavioral: Negative.   All other systems reviewed and are negative.      Objective:   Physical Exam  Constitutional: He is oriented to person, place, and time. He appears well-developed and well-nourished.  HENT:  Head: AT,[place] Right Ear: External ear normal.  Left Ear: External ear normal.  Nose: Nose normal.  Mouth/Throat: Oropharynx is clear and moist.  Eyes: Conjunctivae and EOM are normal. Pupils are equal, round, and reactive to light.  Neck: Normal range of motion. Neck supple.  Cardiovascular: Normal rate and regular rhythm. No JVD Pulmonary/Chest: Effort normal.  Abdominal: Soft. Bowel sounds are normal.  Musculoskeletal: Normal range of motion.  Neurological: He is  alert and oriented to person, place, and time. A cranial nerve deficit is present.  LLE is 1-3/5. LUE is 1-3/5. Spasticity is 1/4 in LUE including wrist, HI, biceps, shoulder .Marland Kitchen Sensation 1/2 in both upper and lower. Pill rolling tremor right hand at times. . Initiates movement fairly well right hand/leg. Needs extra time to change directions. Was able to stand and take a few steps iwht his cane. His left knee tends to flex/bounce when in standing on the leg.  Slight masked facies. No rigidity. He was in a w/c today. Cognition was at baseline. Psychiatric: He has a normal mood and affect. His behavior is normal. Judgment and thought content normal.  Skin: healed scar right nose, mandibular areas.     Assessment & Plan:   ASSESSMENT:  1. Right cerebrovascular accident, spastic left hemiparesis.  2. Seizure disorder.  3. Parkinson's disease 4. Bladder cancer   PLAN:  1. Continue sinemet for PD symptoms. Consider outpt therapies for PD related symptoms.  2. Continue with Dilantin and keppra per neuro recs.  3. Dantrium will be continued for spasticity management. Last LFT's normal.  4. Bladder cancer mgt per urology. 5.12 months follow up. All questions were encouraged and answered. He will call me back prn.

## 2016-08-19 NOTE — Patient Instructions (Addendum)
PLEASE CALL ME WITH ANY PROBLEMS OR QUESTIONS [phone])  LET ME KNOW IF YOU WANT TO PURSUE THERAPIES FOR PARKINSON'S SYMPTOMS.

## 2016-08-20 ENCOUNTER — Telehealth: Payer: Self-pay | Admitting: Registered Nurse

## 2016-08-20 NOTE — Telephone Encounter (Signed)
error 

## 2016-09-11 ENCOUNTER — Other Ambulatory Visit: Payer: Self-pay | Admitting: Diagnostic Neuroimaging

## 2016-09-11 ENCOUNTER — Other Ambulatory Visit: Payer: Self-pay | Admitting: Cardiovascular Disease

## 2016-09-11 NOTE — Telephone Encounter (Signed)
REFILL 

## 2016-09-26 ENCOUNTER — Ambulatory Visit (INDEPENDENT_AMBULATORY_CARE_PROVIDER_SITE_OTHER): Payer: Medicare Other | Admitting: Cardiovascular Disease

## 2016-09-26 ENCOUNTER — Encounter: Payer: Self-pay | Admitting: Cardiovascular Disease

## 2016-09-26 VITALS — BP 148/64 | HR 70 | Ht 67.0 in | Wt 173.6 lb

## 2016-09-26 DIAGNOSIS — I739 Peripheral vascular disease, unspecified: Secondary | ICD-10-CM

## 2016-09-26 DIAGNOSIS — I10 Essential (primary) hypertension: Secondary | ICD-10-CM

## 2016-09-26 DIAGNOSIS — I779 Disorder of arteries and arterioles, unspecified: Secondary | ICD-10-CM | POA: Diagnosis not present

## 2016-09-26 DIAGNOSIS — I2510 Atherosclerotic heart disease of native coronary artery without angina pectoris: Secondary | ICD-10-CM

## 2016-09-26 DIAGNOSIS — E7800 Pure hypercholesterolemia, unspecified: Secondary | ICD-10-CM

## 2016-09-26 NOTE — Assessment & Plan Note (Signed)
History of CAD status post bypass grafting by Dr. Roxan Hockey in 2004. His last Myoview stress test performed 01/09/11 was nonischemic. He denies chest pain or shortness of breath.

## 2016-09-26 NOTE — Assessment & Plan Note (Signed)
History of hypertension blood pressure measured 148/64. He is on Benicar and metoprolol. Continue current meds at current dosing

## 2016-09-26 NOTE — Addendum Note (Signed)
Addended by: Therisa Doyne on: 09/26/2016 12:30 PM   Modules accepted: Orders

## 2016-09-26 NOTE — Progress Notes (Signed)
09/26/2016 [name]   1946/02/27  [id number]  Primary Physician Lilian Coma, MD Primary Cardiologist: Lorretta Harp MD Renae Gloss  HPI:  [name] is a 70 year old mildly overweight but was Caucasian male father of 4 children accompanied by his first ex-wife who is his caregiver. I last saw him in the office 09/25/15. His primary care physician is Dr. [name]. He he is a retired Administrator. His cardiac risk factor profile was positive for 75 pack years of tobacco smoking having quit in 2009. He is treated hyperlipidemia. He did have bilateral carotid endarterectomies back in 1999 a sister with a stroke that caused left hemiparesis and paresthesias. He had bilateral carotid stenting performed by Dr. Trula Slade and myself in a staged fashion because of restenosis back in 2012. He also clearly has Parkinson's disease which has been progressive over the last year now requiring him to ambulate with the aid of a walker.. He had coronary bypass grafting in 2004 by Dr. Merilynn Finland and a Myoview stress test performed 01/09/11 was nonischemic. He denies chest pain or shortness of breath. He has been diagnosed with bladder cancer and is treated by Dr. Era Bumpers. Addition, he was recently diagnosed with Parkinson's disease and is wheelchair-bound.   Current Outpatient Prescriptions  Medication Sig Dispense Refill  . AGGRENOX 25-200 MG 12hr capsule TAKE 1 CAPSULE BY MOUTH 2 (TWO) TIMES DAILY. 180 capsule 3  . BENICAR 20 MG tablet TAKE 1 TABLET BY MOUTH DAILY 90 tablet 1  . carbidopa-levodopa (SINEMET IR) 25-100 MG tablet Take 1-2 tablets by mouth 3 (three) times daily. (Patient taking differently: Take by mouth 3 (three) times daily. ) 540 tablet 4  . cholecalciferol (VITAMIN D) 1000 units tablet Take 1,000 Units by mouth daily with breakfast.    . Coenzyme Q10 (COQ-10) 50 MG CAPS Take 1 capsule by mouth 2 (two) times daily.    . CRESTOR 20 MG tablet Take 1.5  tablets (30 mg total) by mouth daily. 135 tablet 0  . dantrolene (DANTRIUM) 50 MG capsule TAKE 1 CAPSULE BY MOUTH TWICE DAILY 180 capsule 0  . DILANTIN 100 MG ER capsule Take 1 capsule (100 mg total) by mouth 3 (three) times daily. 270 capsule 4  . fenofibrate (TRICOR) 48 MG tablet TAKE 1 TABLET BY MOUTH DAILY 90 tablet 0  . Flaxseed, Linseed, 1000 MG CAPS Take 1 capsule by mouth every evening.     Marland Kitchen FOLBIC 2.5-25-2 MG TABS tablet TAKE 1 TABLET DAILY WITH FOOD 90 tablet 3  . KEPPRA 500 MG tablet Take 1 tablet (500 mg total) by mouth 2 (two) times daily. 180 tablet 4  . metoprolol tartrate (LOPRESSOR) 25 MG tablet Take 1 tablet (25 mg total) by mouth daily. KEEP OV. 90 tablet 0  . nitroGLYCERIN (NITROSTAT) 0.4 MG SL tablet Place 1 tablet (0.4 mg total) under the tongue every 5 (five) minutes as needed for chest pain. 25 tablet 3  . phenazopyridine (PYRIDIUM) 200 MG tablet Take 1 tablet (200 mg total) by mouth 3 (three) times daily as needed for pain. 10 tablet 0  . vitamin C (ASCORBIC ACID) 500 MG tablet Take 500 mg by mouth every evening.     Marland Kitchen ZETIA 10 MG tablet TAKE 1 TABLET (10 MG TOTAL) BY MOUTH DAILY. 90 tablet 2   No current facility-administered medications for this visit.     Allergies  Allergen Reactions  . Altace [Ramipril] Cough    Social History  Social History  . Marital status: Divorced    Spouse name: N/A  . Number of children: 4  . Years of education: HS   Occupational History  .  Retired   Social History Main Topics  . Smoking status: Former Smoker    Years: 5.00    Types: Cigarettes    Quit date: 11/14/2007  . Smokeless tobacco: Never Used  . Alcohol use No  . Drug use: No  . Sexual activity: Not on file   Other Topics Concern  . Not on file   Social History Narrative   Patient lives at home with his caregiver/ex-wife.   Caffeine- 2 cups of coffee daily     Review of Systems: General: negative for chills, fever, night sweats or weight changes.    Cardiovascular: negative for chest pain, dyspnea on exertion, edema, orthopnea, palpitations, paroxysmal nocturnal dyspnea or shortness of breath Dermatological: negative for rash Respiratory: negative for cough or wheezing Urologic: negative for hematuria Abdominal: negative for nausea, vomiting, diarrhea, bright red blood per rectum, melena, or hematemesis Neurologic: negative for visual changes, syncope, or dizziness All other systems reviewed and are otherwise negative except as noted above.    Blood pressure (!) 148/64, pulse 70, height 5\' 7"  (1.702 m), weight 173 lb 9.6 oz (78.7 kg).  General appearance: alert and no distress Neck: no adenopathy, no carotid bruit, no JVD, supple, symmetrical, trachea midline and thyroid not enlarged, symmetric, no tenderness/mass/nodules Lungs: clear to auscultation bilaterally Heart: regular rate and rhythm, S1, S2 normal, no murmur, click, rub or gallop Extremities: extremities normal, atraumatic, no cyanosis or edema  EKG sinus rhythm at 70 with nonspecific ST and T-wave changes. I personally reviewed this EKG  ASSESSMENT AND PLAN:   Occlusion and stenosis of carotid artery without mention of cerebral infarction History of carotid artery disease status post remote bilateral carotid endarterectomies in 1999 for stroke caused left hemiparesis. He had bilateral carotid stenting performed by Dr. Trula Slade and myself in a staged fashion as a restenosis back in 2012. We will recheck carotid Doppler studies.  Coronary artery disease History of CAD status post bypass grafting by Dr. Roxan Hockey in 2004. His last Myoview stress test performed 01/09/11 was nonischemic. He denies chest pain or shortness of breath.  Hyperlipidemia History of hyperlipidemia on statin therapy. We will recheck a lipid and liver profile  Essential hypertension History of hypertension blood pressure measured 148/64. He is on Benicar and metoprolol. Continue current meds at  current dosing      Lorretta Harp MD [facility], [facility] 09/26/2016 12:27 PM

## 2016-09-26 NOTE — Patient Instructions (Addendum)
Medication Instructions: Your physician recommends that you continue on your current medications as directed. Please refer to the Current Medication list given to you today.   Labwork: Your physician recommends that you return for a FASTING lipid profile and hepatic function panel   Testing/Procedures: Your physician has requested that you have a carotid duplex. This test is an ultrasound of the carotid arteries in your neck. It looks at blood flow through these arteries that supply the brain with blood. Allow one hour for this exam. There are no restrictions or special instructions.   Follow-Up: Your physician wants you to follow-up in: 1 year with Dr. Berry. You will receive a reminder letter in the mail two months in advance. If you don't receive a letter, please call our office to schedule the follow-up appointment.  If you need a refill on your cardiac medications before your next appointment, please call your pharmacy.  

## 2016-09-26 NOTE — Assessment & Plan Note (Signed)
History of hyperlipidemia on statin therapy. We will recheck a lipid and liver profile 

## 2016-09-26 NOTE — Assessment & Plan Note (Signed)
History of carotid artery disease status post remote bilateral carotid endarterectomies in 1999 for stroke caused left hemiparesis. He had bilateral carotid stenting performed by Dr. Trula Slade and myself in a staged fashion as a restenosis back in 2012. We will recheck carotid Doppler studies.

## 2016-10-11 ENCOUNTER — Other Ambulatory Visit: Payer: Self-pay | Admitting: Physical Medicine & Rehabilitation

## 2016-10-22 LAB — HEPATIC FUNCTION PANEL
ALT: 5 U/L — ABNORMAL LOW (ref 9–46)
AST: 13 U/L (ref 10–35)
Albumin: 3.6 g/dL (ref 3.6–5.1)
Alkaline Phosphatase: 72 U/L (ref 40–115)
Bilirubin, Direct: 0.1 mg/dL (ref <=0.2)
Indirect Bilirubin: 0.2 mg/dL (ref 0.2–1.2)
Total Bilirubin: 0.3 mg/dL (ref 0.2–1.2)
Total Protein: 6.7 g/dL (ref 6.1–8.1)

## 2016-10-22 LAB — LIPID PANEL
Cholesterol: 264 mg/dL — ABNORMAL HIGH (ref <200)
HDL: 29 mg/dL — ABNORMAL LOW (ref >40)
Total CHOL/HDL Ratio: 9.1 Ratio — ABNORMAL HIGH (ref <5.0)
Triglycerides: 931 mg/dL — ABNORMAL HIGH (ref <150)

## 2016-10-22 LAB — LDL CHOLESTEROL, DIRECT: Direct LDL: 78 mg/dL (ref <130)

## 2016-10-24 ENCOUNTER — Other Ambulatory Visit: Payer: Self-pay | Admitting: Cardiovascular Disease

## 2016-10-24 DIAGNOSIS — E785 Hyperlipidemia, unspecified: Secondary | ICD-10-CM

## 2016-10-27 ENCOUNTER — Ambulatory Visit: Payer: Medicare Other | Admitting: Diagnostic Neuroimaging

## 2016-10-30 ENCOUNTER — Encounter (HOSPITAL_COMMUNITY): Payer: Medicare Other

## 2016-11-01 ENCOUNTER — Other Ambulatory Visit: Payer: Self-pay | Admitting: Diagnostic Neuroimaging

## 2016-11-01 ENCOUNTER — Other Ambulatory Visit: Payer: Self-pay | Admitting: Cardiovascular Disease

## 2016-11-03 ENCOUNTER — Ambulatory Visit (INDEPENDENT_AMBULATORY_CARE_PROVIDER_SITE_OTHER): Payer: Medicare Other | Admitting: Diagnostic Neuroimaging

## 2016-11-03 ENCOUNTER — Encounter: Payer: Self-pay | Admitting: Diagnostic Neuroimaging

## 2016-11-03 ENCOUNTER — Other Ambulatory Visit: Payer: Self-pay

## 2016-11-03 VITALS — BP 137/74 | HR 70 | Wt 162.0 lb

## 2016-11-03 DIAGNOSIS — G40909 Epilepsy, unspecified, not intractable, without status epilepticus: Secondary | ICD-10-CM

## 2016-11-03 DIAGNOSIS — I6930 Unspecified sequelae of cerebral infarction: Secondary | ICD-10-CM | POA: Diagnosis not present

## 2016-11-03 DIAGNOSIS — G20 Parkinson's disease: Secondary | ICD-10-CM

## 2016-11-03 MED ORDER — DILANTIN 100 MG PO CAPS: 100.0000 mg | ORAL_CAPSULE | Freq: Three times a day (TID) | ORAL | 4 refills | Status: DC

## 2016-11-03 MED ORDER — KEPPRA 500 MG PO TABS: 500.0000 mg | ORAL_TABLET | Freq: Two times a day (BID) | ORAL | 4 refills | Status: DC

## 2016-11-03 MED ORDER — CRESTOR 20 MG PO TABS: 30.0000 mg | ORAL_TABLET | Freq: Every day | ORAL | 3 refills | Status: DC

## 2016-11-03 MED ORDER — CARBIDOPA-LEVODOPA 25-100 MG PO TABS: 2.0000 | ORAL_TABLET | Freq: Three times a day (TID) | ORAL | 4 refills | Status: DC

## 2016-11-03 NOTE — Progress Notes (Signed)
[facility]  PATIENT: [name] DOB: Jan 08, 1946  REFERRING CLINICIAN:  HISTORY FROM: patient and wife REASON FOR VISIT: follow up   HISTORICAL  CHIEF COMPLAINT:  Chief Complaint  Patient presents with  . Parkinsonism    rm 6, caregiver- [name], "no new issues, concern about carbi-levo increasing blood sugar"  . Follow-up    6 month    HISTORY OF PRESENT ILLNESS:   UPDATE 11/03/16: Since last visit, voice is more hoarse. More trouble with blood sugar regulation. More falls. New rash on right face and left back.   UPDATE 04/22/16: Since last visit, postural tremor is slightly worse; resting tremor is stable. More freezing with gait initiation. No falls. Had 1 more bladder cancer surgery on 04/17/16.   UPDATE 10/23/15: Since last visit, overall stable; but unfortunately dx'd with bladder CA, s/p resection. Stable on meds. Tremor stable.   UPDATE 04/19/15: Since last visit, doing better. Less choking on saliva. Postural and action tremor stable. Balance stable. Mainly with postural and action tremor.   UPDATE 12/18/14: Since last visit, stable. On carb/levo half tab TID. May have had some improvement in tremor. Still using single point cane. No falls since last visit.   UPDATE 09/13/14: Since last visit, symptoms are stable. Still falling. More problems due to right sided slowness and chronic left hemiparesis. Asking about parkinson's dz meds, diagnosis, treatment, supplements.   UPDATE 03/08/14: Since last visit, no seizures. Has been falling more. Has noticed hand writing deterioration over past 3 years. Also intermittent right hand tremor x 3-6 months (per wife). Patient noted some right hand tremor even in 1999.  UPDATE 03/08/13: Since last visit patient doing well. No seizures since 2005. Patient is stable on Dilantin and Keppra brand name medications. Patient's swallowing has improved since last visit. He make sure to take liquids following each mouthful of  food.  PRIOR HPI (08/27/12, Dr. Erling Cruz): 71 year old right-handed white divorced male from [place], [place] who is cared for by his ex-first wife. He has a known history of hypertension, right MCA stroke 3/99 associated with residual left hemiparesis, acute right MCA stroke 09/2003,coronary artery disease status post 3 vessel bypass in 2004, bilateral carotid endarterectomies in 1999 and bilateral stent placement 5/12, recurrent TIAs versus seizures, documented seizure 11/12/2006, and Dilantin toxicity. His last MRI of the brain without contrast 11/13/2006 showed a large chronic right middle artery infarct involving the posterior temporal and parietal lobe and right thalamus. MR angiogram of the head without contrast showed intracranial atherosclerotic disease and occlusion of the right middle cerebral artery branch corresponding to the right MCA infarct. There was mild atherosclerotic vascular disease elsewhere and no aneurysm. Doppler study of the carotids 12/16/07 was negative for hemodynamically significant stenosis in the intracranial arteries.  EEG 11/13/2006 showed rhythmic discharges correlating with the EKG and were thought to be artifact. . His last seizure was 11/18/2006. He subsequently had one episode of confusion 11/26/2009 which might have been a seizure. He was at his son's house, came home, looked in the  driveway, and asked where his car was. His car had been given to someone else in the family. He kept asking about the car and recognized that he was confused. The episode lasted 40 minutes. The patient lives with his caretaker. He is able to bathe himself and  take care of his toileting needs, but requires assistance in dressing. He  exercises at the [facility] 2 days per week. He has falls with a fall assessment  tool score of 10. He denies macropsia, micropsia, strange odors or tastes. His caretaker reports that at times he has left knee hyperextension. He uses a left leg brace. He denies TIA symptoms  or warnings of seizures.He has finished 2 months of PT in no rehabilitation and has a new left foot brace, which is not associated with hyperextension of his knee. He denies spasticity or spasms. He enjoys playing "We" 2 times per day for exercise.His wife reports that he chokes on his saliva. He has not had  unexplained fever or chills. Last trough levetiracetam level 15.9  and phenytoin level 13.4 on 03/11/2012.  REVIEW OF SYSTEMS: Full 14 system review of systems performed and negative except for increased thirst tremors and walking diff.    ALLERGIES: Allergies  Allergen Reactions  . Altace [Ramipril] Cough    HOME MEDICATIONS: Outpatient Medications Prior to Visit  Medication Sig Dispense Refill  . AGGRENOX 25-200 MG 12hr capsule TAKE 1 CAPSULE BY MOUTH 2 (TWO) TIMES DAILY. 180 capsule 3  . BENICAR 20 MG tablet TAKE 1 TABLET BY MOUTH DAILY 90 tablet 1  . carbidopa-levodopa (SINEMET IR) 25-100 MG tablet Take 1-2 tablets by mouth 3 (three) times daily. (Patient taking differently: Take by mouth 3 (three) times daily. ) 540 tablet 4  . cholecalciferol (VITAMIN D) 1000 units tablet Take 1,000 Units by mouth daily with breakfast.    . Coenzyme Q10 (COQ-10) 50 MG CAPS Take 1 capsule by mouth 2 (two) times daily.    . CRESTOR 20 MG tablet Take 1.5 tablets (30 mg total) by mouth daily. 135 tablet 0  . dantrolene (DANTRIUM) 50 MG capsule TAKE 1 CAPSULE BY MOUTH TWICE DAILY 180 capsule 3  . DILANTIN 100 MG ER capsule Take 1 capsule (100 mg total) by mouth 3 (three) times daily. 270 capsule 4  . fenofibrate (TRICOR) 48 MG tablet TAKE 1 TABLET BY MOUTH DAILY 90 tablet 0  . Flaxseed, Linseed, 1000 MG CAPS Take 1 capsule by mouth every evening.     Marland Kitchen FOLBIC 2.5-25-2 MG TABS tablet TAKE 1 TABLET DAILY WITH FOOD 90 tablet 3  . KEPPRA 500 MG tablet Take 1 tablet (500 mg total) by mouth 2 (two) times daily. 180 tablet 4  . metoprolol tartrate (LOPRESSOR) 25 MG tablet Take 1 tablet (25 mg total) by mouth  daily. KEEP OV. 90 tablet 0  . nitroGLYCERIN (NITROSTAT) 0.4 MG SL tablet Place 1 tablet (0.4 mg total) under the tongue every 5 (five) minutes as needed for chest pain. 25 tablet 3  . phenazopyridine (PYRIDIUM) 200 MG tablet Take 1 tablet (200 mg total) by mouth 3 (three) times daily as needed for pain. 10 tablet 0  . vitamin C (ASCORBIC ACID) 500 MG tablet Take 500 mg by mouth every evening.     Marland Kitchen ZETIA 10 MG tablet TAKE 1 TABLET (10 MG TOTAL) BY MOUTH DAILY. 90 tablet 2   No facility-administered medications prior to visit.     PAST MEDICAL HISTORY: Past Medical History:  Diagnosis Date  . At high risk for falls   . Bladder cancer [facility])    urologist-  dr Gaynelle Arabian  . CAD (coronary artery disease) cardiologist-  dr berry  . Cerebrovascular arteriosclerosis   . Gait disturbance, post-stroke    uses cane, rollator, and w/c long distance  . Hemiparkinsonism [facility]) neurologist-- dr Leta Baptist   right side body  . History of basal cell carcinoma excision    Sept 2016--  MOH's surgery side  of nose  . History of carotid artery stenosis cardiologist-- dr berry   bilateral --  1999 s/p  bilateral ICA endarterectomy and recurrent restenosis 2012  s/p  staged bilateral stenting   per last duplex 2015  stents widely patent  . History of CVA with residual deficit neurologist-  dr Leta Baptist   3/ 1999  Right MCA  and  12/ 2004  Anterior division of  Right MCA ---  residual left spactic hemiparesis and left foot drop  . History of MI (myocardial infarction)    02/ 2004   s/p  cabg   . HOH (hard of hearing)    LEFT EAR  POST cva  . Hyperextension deformity of left knee    wears brace  . Hyperlipidemia   . Hypertension   . Left foot drop    residual from CVA  -- wears brace  . Left spastic hemiparesis ([place])    residual CVA 1999  . Pancreatic pseudocyst   . Peripheral arterial disease ([place])   . S/P CABG x 4    02/ 2004  . Seizure disorder [facility]) last seizure 2011 due to confusion    neurologist-  dr Leta Baptist-- per note seizure documented 2008 breakthrough partial complex seizure (prior tonic-clonic seizure's post CVA)  . Type 2 diabetes, diet controlled ([place])   . Visual neglect    LEFT EYE    PAST SURGICAL HISTORY: Past Surgical History:  Procedure Laterality Date  . CARDIAC CATHETERIZATION  11/17/2002   significant left main disease and 3 vessel CAD, mildly depressed LV systolic  fx, 123456 left renal artery stenosis  . CAROTID ENDARTERECTOMY  12/1997   [facility])   Bilateral ICA  . CAROTID STENT INSERTION Bilateral dr berry and dr Kristine Royal--  (In-stent Restenosis)  LeftICA   02-12-2011;  [place]   02-26-2011  . CORONARY ARTERY BYPASS GRAFT  2/ 2004   dr hendrickson   x3 LIMA to LAD,free right internal mammary artery to obtuse marginal 1,SVG to distal right coronary.  . CYSTOSCOPY W/ RETROGRADES Bilateral 09/10/2015   Procedure: CYSTOSCOPY WITH RETROGRADE PYELOGRAM;  Surgeon: Carolan Clines, MD;  Location: [facility];  Service: Urology;  Laterality: Bilateral;  . CYSTOSCOPY WITH BIOPSY N/A 09/10/2015   Procedure: CYSTOSCOPY WITH BIOPSY,RIGHT TRIGONAL TUMOR 1.5 CM, EXCISIONAL BIOPSY WITH TAUBER FORCEP RIGHT POSTERIOR BLADDER WALL 1 CM, EXCISIONAL BIOPSY SATELITE BLADDER WALL 1 CM;  Surgeon: Carolan Clines, MD;  Location: [facility];  Service: Urology;  Laterality: N/A;  . CYSTOSCOPY WITH HYDRODISTENSION AND BIOPSY N/A 10/29/2015   Procedure: REPEAT CYSTOSCOPY BIOPSY BLADDER DEEP MUSCLES BLADDER BIOPSY;  Surgeon: Carolan Clines, MD;  Location: [place];  Service: Urology;  Laterality: N/A;  . CYSTOSCOPY WITH URETHRAL DILATATION N/A 09/10/2015   Procedure: CYSTOSCOPY WITH URETHRAL DILATATION;  Surgeon: Carolan Clines, MD;  Location: [place];  Service: Urology;  Laterality: N/A;  . [place]  . MOHS SURGERY  06-20-2015   side of nose  . NM MYOCAR PERF WALL  MOTION  01/09/2011   dr berry   mild to mod. perfusion defect in the basal inferolateral & mid inferolaterl regions consistant with an infarct/scar, no signigicant ischemia demonstracted/  Low Risk scan, no sig. change from previous study/  normal LV function and wall motion , ef 63%  . ORIF RIGHT ANKLE FX  1998   hardware retained  . TRANSURETHRAL RESECTION OF BLADDER TUMOR N/A 09/10/2015   Procedure: CYSTO TRANSURETHRAL RESECTION  OF BLADDER TUMOR (TURBT) OF LEFT POSTERIOR BLADDER TUMOR 2 CM;  Surgeon: Carolan Clines, MD;  Location[facility];  Service: Urology;  Laterality: N/A;  . TRANSURETHRAL RESECTION OF BLADDER TUMOR Left 04/17/2016   Procedure: TRANSURETHRAL RESECTION OF BLADDER TUMOR (TURBT);  Surgeon: Carolan Clines, MD;  Location: [facility];  Service: Urology;  Laterality: Left;  . US ECHOCARDIOGRAPHY  01/03/2011   normal LVF, ef>55%/  mild LAE/ trace MR and TR,  mild AV sclerosis without stenosis    FAMILY HISTORY: Family History  Problem Relation Age of Onset  . Stroke Father   . Heart attack Father   . Stroke Mother   . Diabetes Brother   . Hyperlipidemia Brother   . Hypertension Brother   . Hyperlipidemia Sister   . Hypertension Sister     SOCIAL HISTORY:  Social History   Social History  . Marital status: Divorced    Spouse name: N/A  . Number of children: 4  . Years of education: HS   Occupational History  .  Retired   Social History Main Topics  . Smoking status: Former Smoker    Years: 5.00    Types: Cigarettes    Quit date: 11/14/2007  . Smokeless tobacco: Never Used  . Alcohol use No  . Drug use: No  . Sexual activity: Not on file   Other Topics Concern  . Not on file   Social History Narrative   Patient lives at home with his caregiver/ex-wife.   Caffeine- 2 cups of coffee daily     PHYSICAL EXAM  Vitals:   11/03/16 1129  BP: 137/74  Pulse: 70  Weight: 162 lb (73.5 kg)    Not recorded      Body mass index is  25.37 kg/m.  GENERAL EXAM: - Patient is in no distress; MASKED FACIES - SLIGHT RIGHT FACIAL RASH, NON-TENDER; RED   CARDIOVASCULAR: Regular rate and rhythm, no murmurs, no carotid bruits  NEUROLOGIC: MENTAL STATUS: awake, alert, SLIGHTLY DECR FLUENCY. Comprehension intact, naming intact CRANIAL NERVE: pupils equal and reactive to light, visual fields full to confrontation, extraocular muscles intact, no nystagmus, facial sensation symmetric; DECR LEFT LOWER FACIAL STRENGTH. MILD DYSARTHRIA uvula midline, shoulder shrug symmetric, tongue midline. MOTOR: INCR TONE IN LUE AND LLE. LUE 1-2/5 PROX, 0 DISTAL. LLE 3/5 WITH FOOT DROP. VERY RARE REST TREMOR IN RUE. MILD RUE POSTURAL TREMOR. MILD BRADYKINESIA WITH RUE AND RLE.  SENSORY: DECR ON LEFT SIDE COORDINATION: finger-nose-finger, fine finger movements SLOW ON RIGHT. LIMITED ON LEFT DUE TO WEAKNESS. REFLEXES: BRISK IN LUE AND LLE. GAIT/STATION: LEFT HEMIPARIETIC GAIT; IN WHEELCHAIR; DIFF STANDING     DIAGNOSTIC DATA (LABS, IMAGING, TESTING) - I reviewed patient records, labs, notes, testing and imaging myself where available.  Lab Results  Component Value Date   WBC 7.4 04/10/2016   HGB 13.8 04/10/2016   HCT 41.9 04/10/2016   MCV 99.1 04/10/2016   PLT 196 04/10/2016      Component Value Date/Time   NA 138 04/10/2016 1145   K 6.1 (H) 04/10/2016 1145   CL 107 04/10/2016 1145   CO2 26 04/10/2016 1145   GLUCOSE 151 (H) 04/10/2016 1145   BUN 29 (H) 04/10/2016 1145   CREATININE 1.15 04/10/2016 1145   CREATININE 1.11 07/13/2013 0814   CALCIUM 9.8 04/10/2016 1145   PROT 6.7 10/22/2016 0821   ALBUMIN 3.6 10/22/2016 0821   AST 13 10/22/2016 0821   ALT 5 (L) 10/22/2016 0821   ALKPHOS 72 10/22/2016  0821   BILITOT 0.3 10/22/2016 0821   GFRNONAA >60 04/10/2016 1145   GFRAA >60 04/10/2016 1145   Lab Results  Component Value Date   CHOL 264 (H) 10/22/2016   HDL 29 (L) 10/22/2016   LDLCALC NOT CALC 10/22/2016   LDLDIRECT 78  10/22/2016   TRIG 931 (H) 10/22/2016   CHOLHDL 9.1 (H) 10/22/2016   Lab Results  Component Value Date   HGBA1C 6.5 (H) 05/23/2011   Lab Results  Component Value Date   [place] (H) 05/23/2011   Lab Results  Component Value Date   TSH 3.245 07/13/2013    10/19/14 MRI BRAIN 1. Chronic right hemispheric ischemic infarction. 2. Mild chronic small vessel ischemic disease. 3. No acute findings. No change from MRI on 11/13/06.     ASSESSMENT AND PLAN  71 y.o. year old male  has a past medical history of At high risk for falls; Bladder cancer [facility]); CAD (coronary artery disease) (cardiologist-  dr berry); Cerebrovascular arteriosclerosis; Gait disturbance, post-stroke; Hemiparkinsonism [facility]) (neurologist-- dr Leta Baptist); History of basal cell carcinoma excision; History of carotid artery stenosis (cardiologist-- dr berry); History of CVA with residual deficit (neurologist-  dr Leta Baptist); History of MI (myocardial infarction); HOH (hard of hearing); Hyperextension deformity of left knee; Hyperlipidemia; Hypertension; Left foot drop; Left spastic hemiparesis ([place]); Pancreatic pseudocyst; Peripheral arterial disease ([place]); S/P CABG x 4; Seizure disorder ([place]) (last seizure 2011 due to confusion); Type 2 diabetes, diet controlled ([place]); and Visual neglect. here with history of right MCA stroke (1999) and seizure disorder. Patient is doing well on brand name Keppra and Dilantin. Now with right body hemiparkinsonism since 2013. May also have had essential tremor since the 1960's (in retrospect).    Dx:  Parkinsonism, unspecified Parkinsonism type ([place])  Chronic ischemic right MCA stroke  Seizure disorder ([place])    PLAN: - continue brand name dilantin and keppra for seizure control - continue carb/levo up 2 tabs TID for parkinsonism symptoms - may consider primidone in future for essential tremor, but difficulty due to polypharmacy - high fall risk due to combination of chronic right  MCA stroke and right body hemiparkinsonism; use wheelchair and rollator walker with seat  Meds ordered this encounter  Medications  . carbidopa-levodopa (SINEMET IR) 25-100 MG tablet    Sig: Take 2 tablets by mouth 3 (three) times daily.    Dispense:  540 tablet    Refill:  4  . KEPPRA 500 MG tablet    Sig: Take 1 tablet (500 mg total) by mouth 2 (two) times daily.    Dispense:  180 tablet    Refill:  4  . DILANTIN 100 MG ER capsule    Sig: Take 1 capsule (100 mg total) by mouth 3 (three) times daily.    Dispense:  270 capsule    Refill:  4   Return in about 6 months (around 05/03/2017).     Penni Bombard, MD [id number], 123456 PM Certified in Neurology, Neurophysiology and Neuroimaging  [facility] [address], [place], [place] [address] [phone]

## 2016-11-03 NOTE — Telephone Encounter (Signed)
Rx(s) sent to pharmacy electronically.  

## 2016-11-06 ENCOUNTER — Other Ambulatory Visit: Payer: Self-pay | Admitting: *Deleted

## 2016-11-06 ENCOUNTER — Telehealth: Payer: Self-pay | Admitting: Cardiovascular Disease

## 2016-11-06 DIAGNOSIS — E785 Hyperlipidemia, unspecified: Secondary | ICD-10-CM

## 2016-11-06 NOTE — Telephone Encounter (Signed)
Wife is on [place]. Spoke to her concerning this and advised fine to wait until CBGs WNR out of her concern for falsely elevation trigs.

## 2016-11-06 NOTE — Telephone Encounter (Signed)
New message     FYI Calling to let doctor know that pts blood sugar level was 464.  PCP want pt to get his blood sugar level down before he has labs drawn by Dr Gwenlyn Found to check triglycerides.  Today, blood sugar level is in the 200's .  As soon as blood sugar level is normal, pt will have labs drawn.

## 2016-11-09 ENCOUNTER — Other Ambulatory Visit: Payer: Self-pay | Admitting: Cardiovascular Disease

## 2016-11-10 ENCOUNTER — Other Ambulatory Visit: Payer: Self-pay | Admitting: Cardiovascular Disease

## 2016-11-10 NOTE — Telephone Encounter (Signed)
Line busy x 2

## 2016-11-10 NOTE — Telephone Encounter (Signed)
His insurance will no longer pay for Crestor.The doctor can say that pt needs the Crestor or try this drug(Rosuvastatin)?or doctor can suggest another drug. Please call pt and let him know what was decided.

## 2016-11-11 MED ORDER — ROSUVASTATIN CALCIUM 20 MG PO TABS: 30.0000 mg | ORAL_TABLET | Freq: Every day | ORAL | 3 refills | Status: DC

## 2016-11-11 NOTE — Telephone Encounter (Signed)
Generic rosuvastatin sent as requested  [name], caregiver, ex-wife,notified

## 2017-02-10 ENCOUNTER — Telehealth: Payer: Self-pay | Admitting: Cardiovascular Disease

## 2017-02-10 NOTE — Telephone Encounter (Signed)
She wanted you to know she just faxed over a surgical clearnce over.They want to make sure pt is cleared for his Cardiac History.

## 2017-02-10 NOTE — Telephone Encounter (Signed)
Requesting surgical clearance:  1. Type of surgery: Biopsy/Transurethral resection of bladder tumors; Instill Mitomycin C  2. Surgeon: Dr. Gaynelle Arabian  3.Surgical Date:  pending  4. Medications that need to be held: none specified   5. CAD: Yes  6. I will defer to:  Dr. Gwenlyn Found.   Contact Information:   [facility] Phone:  [phone] Fax: [phone]

## 2017-02-10 NOTE — Telephone Encounter (Signed)
Fax has been received and is in the provider's folder to be signed.

## 2017-02-11 NOTE — Telephone Encounter (Signed)
Clearance routed to number provided via EPIC. 

## 2017-02-11 NOTE — Telephone Encounter (Signed)
Message  Received: Today  Message Contents  Lorretta Harp, MD  Therisa Doyne        Cleared for urologic procedure at low risk.

## 2017-02-17 ENCOUNTER — Telehealth: Payer: Self-pay | Admitting: Cardiovascular Disease

## 2017-02-17 NOTE — Telephone Encounter (Signed)
New message     Please resend surgical clearance to [facility] they never received it

## 2017-02-17 NOTE — Telephone Encounter (Signed)
Clearance note printed and faxed to [phone]. See documentation below from 02/11/17 where it was noted that clearance was sent.     Therisa Doyne      4:04 PM  Note    Clearance routed to number provided via EPIC    Therisa Doyne      4:03 PM  Note    Message  Received: Today  Message Contents  Lorretta Harp, MD  Therisa Doyne        Cleared for urologic procedure at low risk.        Feb 10, 2017  Therisa Doyne      3:50 PM  Note    Requesting surgical clearance:  1. Type of surgery: Biopsy/Transurethral resection of bladder tumors; Instill Mitomycin C  2. Surgeon: Dr. Gaynelle Arabian  3.Surgical Date:  pending  4. Medications that need to be held: none specified   5. CAD: Yes  6. I will defer to:  Dr. Gwenlyn Found.   Contact Information:   [facility] Phone:  [phone] Fax: [phone]

## 2017-02-25 ENCOUNTER — Other Ambulatory Visit: Payer: Self-pay | Admitting: Urology

## 2017-03-18 ENCOUNTER — Telehealth: Payer: Self-pay | Admitting: Cardiovascular Disease

## 2017-03-18 NOTE — Telephone Encounter (Signed)
Wife wants to know when does patient needs to stop his Aggrenox before his bladder surgery? His surgery is on 03-26-17.

## 2017-03-20 NOTE — Progress Notes (Signed)
Clearance cardio Dr Gwenlyn Found 02-17-17 epic Clearance Stephanie Acre MD PCP on chart  LOV cardio Berrry 09-26-16 epic LOV Wolters MD 01-23-17 LOV neuro [place] 11-03-16 epci  EKG 09-26-16 epic

## 2017-03-20 NOTE — Patient Instructions (Addendum)
[name]  03/20/2017   Your procedure is scheduled on: 03-26-17  Report to [facility] Main  Entrance Take [place]  elevators to 3rd floor to  [place] at 730AM.   Call this number if you have problems the morning of surgery [phone]    Remember: ONLY 1 PERSON MAY GO WITH YOU TO SHORT STAY TO GET  READY MORNING OF YOUR SURGERY.  Do not eat food or drink liquids :After Midnight.     Take these medicines the morning of surgery with A SIP OF WATER: benicar, carbidopa-levidopa(sinimet), dantrolene(dantrium), dilantin, zetia, fenofibrate, Keppra,                                  You may not have any metal on your body including hair pins and              piercings  Do not wear jewelry, make-up, lotions, powders or perfumes, deodorant                  Men may shave face and neck.   Do not bring valuables to the hospital. [place].  Contacts, dentures or bridgework may not be worn into surgery.      Patients discharged the day of surgery will not be allowed to drive home.  Name and phone number of your driver:  Special Instructions: N/A              Please read over the following fact sheets you were given: _____________________________________________________________________             How to Manage Your Diabetes Before and After Surgery  Why is it important to control my blood sugar before and after surgery? . Improving blood sugar levels before and after surgery helps healing and can limit problems. . A way of improving blood sugar control is eating a healthy diet by: o  Eating less sugar and carbohydrates o  Increasing activity/exercise o  Talking with your doctor about reaching your blood sugar goals . High blood sugars (greater than 180 mg/dL) can raise your risk of infections and slow your recovery, so you will need to focus on controlling your diabetes during the weeks before  surgery. . Make sure that the doctor who takes care of your diabetes knows about your planned surgery including the date and location.  How do I manage my blood sugar before surgery? . Check your blood sugar at least 4 times a day, starting 2 days before surgery, to make sure that the level is not too high or low. o Check your blood sugar the morning of your surgery when you wake up and every 2 hours until you get to the Short Stay unit. . If your blood sugar is less than 70 mg/dL, you will need to treat for low blood sugar: o Do not take insulin. o Treat a low blood sugar (less than 70 mg/dL) with  cup of clear juice (cranberry or apple), 4 glucose tablets, OR glucose gel. o Recheck blood sugar in 15 minutes after treatment (to make sure it is greater than 70 mg/dL). If your blood sugar is not greater than 70 mg/dL on recheck, call [phone] for further instructions. Marland Kitchen  Report your blood sugar to the short stay nurse when you get to Short Stay.  . If you are admitted to the hospital after surgery: o Your blood sugar will be checked by the staff and you will probably be given insulin after surgery (instead of oral diabetes medicines) to make sure you have good blood sugar levels. o The goal for blood sugar control after surgery is 80-180 mg/dL.   WHAT DO I DO ABOUT MY DIABETES MEDICATION?  Marland Kitchen Do not take oral diabetes medicines (pills) the morning of surgery.  . THE DAY BEFORE SURGERY, take HUMALOG insulin  morning and afternoon doses as prescribed. Do not take ANY evening dose.       . THE MORNING OF SURGERY, only take HUMALOG insulin if blood sugar is greater than 220. If so, take 1/2 of prescribed dose.    Patient Signature:  Date:   Nurse Signature:  Date:   Reviewed and Endorsed by [facility] Patient Education Committee, August 2015  [facility] - Preparing for Surgery Before surgery, you can play an important role.  Because skin is not sterile, your skin needs to be as free  of germs as possible.  You can reduce the number of germs on your skin by washing with CHG (chlorahexidine gluconate) soap before surgery.  CHG is an antiseptic cleaner which kills germs and bonds with the skin to continue killing germs even after washing. Please DO NOT use if you have an allergy to CHG or antibacterial soaps.  If your skin becomes reddened/irritated stop using the CHG and inform your nurse when you arrive at Short Stay. Do not shave (including legs and underarms) for at least 48 hours prior to the first CHG shower.  You may shave your face/neck. Please follow these instructions carefully:  1.  Shower with CHG Soap the night before surgery and the  morning of Surgery.  2.  If you choose to wash your hair, wash your hair first as usual with your  normal  shampoo.  3.  After you shampoo, rinse your hair and body thoroughly to remove the  shampoo.                           4.  Use CHG as you would any other liquid soap.  You can apply chg directly  to the skin and wash                       Gently with a scrungie or clean washcloth.  5.  Apply the CHG Soap to your body ONLY FROM THE NECK DOWN.   Do not use on face/ open                           Wound or open sores. Avoid contact with eyes, ears mouth and genitals (private parts).                       Wash face,  Genitals (private parts) with your normal soap.             6.  Wash thoroughly, paying special attention to the area where your surgery  will be performed.  7.  Thoroughly rinse your body with warm water from the neck down.  8.  DO NOT shower/wash with your normal soap after using and rinsing off  the CHG Soap.  9.  Pat yourself dry with a clean towel.            10.  Wear clean pajamas.            11.  Place clean sheets on your bed the night of your first shower and do not  sleep with pets. Day of Surgery : Do not apply any lotions/deodorants the morning of surgery.  Please wear clean clothes to the  hospital/surgery center.  FAILURE TO FOLLOW THESE INSTRUCTIONS MAY RESULT IN THE CANCELLATION OF YOUR SURGERY PATIENT SIGNATURE_________________________________  NURSE SIGNATURE__________________________________  ________________________________________________________________________

## 2017-03-23 ENCOUNTER — Telehealth: Payer: Self-pay | Admitting: Diagnostic Neuroimaging

## 2017-03-23 ENCOUNTER — Encounter (HOSPITAL_COMMUNITY)
Admission: RE | Admit: 2017-03-23 | Discharge: 2017-03-23 | Disposition: A | Discharge status: Home / Self Care | Payer: Medicare Other | Source: Ambulatory Visit | Attending: Urology | Admitting: Urology

## 2017-03-23 ENCOUNTER — Encounter (HOSPITAL_COMMUNITY): Payer: Self-pay

## 2017-03-23 DIAGNOSIS — E785 Hyperlipidemia, unspecified: Secondary | ICD-10-CM | POA: Diagnosis not present

## 2017-03-23 DIAGNOSIS — Z85828 Personal history of other malignant neoplasm of skin: Secondary | ICD-10-CM | POA: Diagnosis not present

## 2017-03-23 DIAGNOSIS — C679 Malignant neoplasm of bladder, unspecified: Secondary | ICD-10-CM | POA: Diagnosis present

## 2017-03-23 DIAGNOSIS — I2510 Atherosclerotic heart disease of native coronary artery without angina pectoris: Secondary | ICD-10-CM | POA: Diagnosis not present

## 2017-03-23 DIAGNOSIS — E119 Type 2 diabetes mellitus without complications: Secondary | ICD-10-CM | POA: Diagnosis not present

## 2017-03-23 DIAGNOSIS — Z79899 Other long term (current) drug therapy: Secondary | ICD-10-CM | POA: Diagnosis not present

## 2017-03-23 DIAGNOSIS — I252 Old myocardial infarction: Secondary | ICD-10-CM | POA: Diagnosis not present

## 2017-03-23 DIAGNOSIS — Z87891 Personal history of nicotine dependence: Secondary | ICD-10-CM | POA: Diagnosis not present

## 2017-03-23 DIAGNOSIS — I10 Essential (primary) hypertension: Secondary | ICD-10-CM | POA: Diagnosis not present

## 2017-03-23 DIAGNOSIS — I739 Peripheral vascular disease, unspecified: Secondary | ICD-10-CM | POA: Diagnosis not present

## 2017-03-23 LAB — COMPREHENSIVE METABOLIC PANEL
ALT: 5 U/L — ABNORMAL LOW (ref 17–63)
AST: 22 U/L (ref 15–41)
Albumin: 4.3 g/dL (ref 3.5–5.0)
Alkaline Phosphatase: 57 U/L (ref 38–126)
Anion gap: 7 (ref 5–15)
BUN: 27 mg/dL — ABNORMAL HIGH (ref 6–20)
CO2: 26 mmol/L (ref 22–32)
Calcium: 9.6 mg/dL (ref 8.9–10.3)
Chloride: 107 mmol/L (ref 101–111)
Creatinine, Ser: 1.13 mg/dL (ref 0.61–1.24)
GFR calc Af Amer: >60 mL/min (ref >60)
GFR calc non Af Amer: >60 mL/min (ref >60)
Glucose, Bld: 121 mg/dL — ABNORMAL HIGH (ref 65–99)
Potassium: 5 mmol/L (ref 3.5–5.1)
Sodium: 140 mmol/L (ref 135–145)
Total Bilirubin: 0.4 mg/dL (ref 0.3–1.2)
Total Protein: 7.4 g/dL (ref 6.5–8.1)

## 2017-03-23 LAB — CBC
HCT: 39.7 % (ref 39.0–52.0)
Hemoglobin: 13.4 g/dL (ref 13.0–17.0)
MCH: 32.8 pg (ref 26.0–34.0)
MCHC: 33.8 g/dL (ref 30.0–36.0)
MCV: 97.1 fL (ref 78.0–100.0)
Platelets: 198 10*3/uL (ref 150–400)
RBC: 4.09 MIL/uL — ABNORMAL LOW (ref 4.22–5.81)
RDW: 13.3 % (ref 11.5–15.5)
WBC: 8.1 10*3/uL (ref 4.0–10.5)

## 2017-03-23 LAB — GLUCOSE, CAPILLARY: Glucose-Capillary: 126 mg/dL — ABNORMAL HIGH (ref 65–99)

## 2017-03-23 NOTE — Telephone Encounter (Signed)
Discussed with Dr Leta Baptist and called wife. Advised her that last year Dr Gwenlyn Found made the decision on Aggrrenox. Wife stated that because she didn;t get a timely reply form Dr Gwenlyn Found, she was told to contact Dr Leta Baptist. Advised her per Dr Leta Baptist that his advice from a neurology standpoint in stroke prevention would be to hold Aggrenox x 5 days prior to surgery. Advised there is a small risk of stroke while off Aggrenox, however patient's stroke was several years ago.  Advised her that at this point the final decision would be Dr Arlyn Leak as to whether he was comfortable from a bleeding risk standpoint to do the surgery on Thurs. Patient's wife stated she would not give her husband the Aggrenox tonight, and she would call Dr Arlyn Leak office to inform them of Dr Gladstone Lighter advice. She stated that her husband wants to go ahead with surgery because Dr Gaynelle Arabian is retiring at the end of June. She verbalized understanding, appreciation of call back.

## 2017-03-23 NOTE — Telephone Encounter (Signed)
Patient's wife is calling. The patient is having bladder surgery this Thursday and wants to know when he should stop taking AGGRENOX 25-200 MG 12hr capsule. Please call and advise.

## 2017-03-23 NOTE — Progress Notes (Signed)
cmp results routed via epic to [place]

## 2017-03-23 NOTE — Telephone Encounter (Addendum)
Spoke with pre-op nurse regarding aggrenox. According to the chart the patient is on aggrenox due to CVA in the past. Explained to the wife, she will need to talk to the neurologist for direction.

## 2017-03-23 NOTE — Telephone Encounter (Signed)
Follow Up Call :  Mrs. Bechtol is calling to find out about Aggrenox before his bladder surgery? Please Call

## 2017-03-23 NOTE — Progress Notes (Signed)
At pre-op , patient and his caregiver informed RN that they had received no instructions about what to do with Aggrenox medication before surgery from cardiologist. RN called over to Dr Gwenlyn Found office and spoke to [place] in triage. She reports that a  message had been sent to [place] about the issue but had not received a response yet. Butch Penny explained that she would send a message to pharmacy for input and would then reach out to patient with any information received. RN agreeable with this plan.

## 2017-03-24 LAB — HEMOGLOBIN A1C
Hgb A1c MFr Bld: 6.4 % — ABNORMAL HIGH (ref 4.8–5.6)
Mean Plasma Glucose: 137 mg/dL

## 2017-03-25 NOTE — Telephone Encounter (Signed)
Agree. -VRP 

## 2017-03-26 ENCOUNTER — Encounter (HOSPITAL_COMMUNITY): Payer: Self-pay | Admitting: *Deleted

## 2017-03-26 ENCOUNTER — Ambulatory Visit (HOSPITAL_COMMUNITY): Payer: Medicare Other | Admitting: Anesthesiology

## 2017-03-26 ENCOUNTER — Encounter (HOSPITAL_COMMUNITY)
Admission: RE | Disposition: A | Discharge status: Home / Self Care | Payer: Self-pay | Source: Ambulatory Visit | Attending: Urology

## 2017-03-26 ENCOUNTER — Ambulatory Visit (HOSPITAL_COMMUNITY)
Admission: RE | Admit: 2017-03-26 | Discharge: 2017-03-26 | Disposition: A | Discharge status: Home / Self Care | Payer: Medicare Other | Source: Ambulatory Visit | Attending: Urology | Admitting: Urology

## 2017-03-26 DIAGNOSIS — Z87891 Personal history of nicotine dependence: Secondary | ICD-10-CM | POA: Insufficient documentation

## 2017-03-26 DIAGNOSIS — I10 Essential (primary) hypertension: Secondary | ICD-10-CM | POA: Diagnosis not present

## 2017-03-26 DIAGNOSIS — Z79899 Other long term (current) drug therapy: Secondary | ICD-10-CM | POA: Diagnosis not present

## 2017-03-26 DIAGNOSIS — I252 Old myocardial infarction: Secondary | ICD-10-CM | POA: Insufficient documentation

## 2017-03-26 DIAGNOSIS — E785 Hyperlipidemia, unspecified: Secondary | ICD-10-CM | POA: Insufficient documentation

## 2017-03-26 DIAGNOSIS — E119 Type 2 diabetes mellitus without complications: Secondary | ICD-10-CM | POA: Insufficient documentation

## 2017-03-26 DIAGNOSIS — C674 Malignant neoplasm of posterior wall of bladder: Secondary | ICD-10-CM

## 2017-03-26 DIAGNOSIS — C679 Malignant neoplasm of bladder, unspecified: Secondary | ICD-10-CM | POA: Diagnosis not present

## 2017-03-26 DIAGNOSIS — Z85828 Personal history of other malignant neoplasm of skin: Secondary | ICD-10-CM | POA: Insufficient documentation

## 2017-03-26 DIAGNOSIS — I739 Peripheral vascular disease, unspecified: Secondary | ICD-10-CM | POA: Insufficient documentation

## 2017-03-26 DIAGNOSIS — I2510 Atherosclerotic heart disease of native coronary artery without angina pectoris: Secondary | ICD-10-CM | POA: Insufficient documentation

## 2017-03-26 HISTORY — PX: FULGURATION OF BLADDER TUMOR: SHX6261

## 2017-03-26 HISTORY — PX: CYSTOSCOPY WITH BIOPSY: SHX5122

## 2017-03-26 LAB — GLUCOSE, CAPILLARY
Glucose-Capillary: 136 mg/dL — ABNORMAL HIGH (ref 65–99)
Glucose-Capillary: 156 mg/dL — ABNORMAL HIGH (ref 65–99)

## 2017-03-26 SURGERY — FULGURATION, NEOPLASM, BLADDER
Anesthesia: General | Site: Bladder

## 2017-03-26 MED ORDER — FENTANYL CITRATE (PF) 100 MCG/2ML IJ SOLN
25.0000 ug | INTRAMUSCULAR | Status: DC | PRN
  Administered 2017-03-26: 25 ug via INTRAVENOUS

## 2017-03-26 MED ORDER — FENTANYL CITRATE (PF) 100 MCG/2ML IJ SOLN
INTRAMUSCULAR | Status: AC
  Filled 2017-03-26: qty 2

## 2017-03-26 MED ORDER — LACTATED RINGERS IV SOLN
INTRAVENOUS | Status: DC
  Administered 2017-03-26: 08:00:00 via INTRAVENOUS

## 2017-03-26 MED ORDER — HYDRALAZINE HCL 20 MG/ML IJ SOLN
INTRAMUSCULAR | Status: AC
  Filled 2017-03-26: qty 1

## 2017-03-26 MED ORDER — FENTANYL CITRATE (PF) 100 MCG/2ML IJ SOLN
INTRAMUSCULAR | Status: DC | PRN
Start: 2017-03-26 — End: 2017-03-26
  Administered 2017-03-26 (×2): 25 ug via INTRAVENOUS

## 2017-03-26 MED ORDER — MITOMYCIN CHEMO FOR BLADDER INSTILLATION 40 MG
40.0000 mg | Freq: Once | INTRAVENOUS | Status: AC
  Administered 2017-03-26: 40 mg via INTRAVESICAL
  Filled 2017-03-26: qty 40

## 2017-03-26 MED ORDER — 0.9 % SODIUM CHLORIDE (POUR BTL) OPTIME
TOPICAL | Status: DC | PRN
  Administered 2017-03-26: 1000 mL

## 2017-03-26 MED ORDER — TRAMADOL-ACETAMINOPHEN 37.5-325 MG PO TABS: 1.0000 | ORAL_TABLET | Freq: Four times a day (QID) | ORAL | 2 refills | Status: DC | PRN

## 2017-03-26 MED ORDER — LIDOCAINE 2% (20 MG/ML) 5 ML SYRINGE
INTRAMUSCULAR | Status: DC | PRN
  Administered 2017-03-26: 60 mg via INTRAVENOUS

## 2017-03-26 MED ORDER — DEXAMETHASONE SODIUM PHOSPHATE 10 MG/ML IJ SOLN
INTRAMUSCULAR | Status: DC | PRN
  Administered 2017-03-26: 10 mg via INTRAVENOUS

## 2017-03-26 MED ORDER — CEFAZOLIN SODIUM-DEXTROSE 2-4 GM/100ML-% IV SOLN
2.0000 g | INTRAVENOUS | Status: AC
  Administered 2017-03-26: 2 g via INTRAVENOUS
  Filled 2017-03-26: qty 100

## 2017-03-26 MED ORDER — STERILE WATER FOR IRRIGATION IR SOLN
Status: DC | PRN
  Administered 2017-03-26: 3000 mL

## 2017-03-26 MED ORDER — DEXAMETHASONE SODIUM PHOSPHATE 10 MG/ML IJ SOLN
INTRAMUSCULAR | Status: AC
  Filled 2017-03-26: qty 1

## 2017-03-26 MED ORDER — ONDANSETRON HCL 4 MG/2ML IJ SOLN
INTRAMUSCULAR | Status: DC | PRN
  Administered 2017-03-26: 4 mg via INTRAVENOUS

## 2017-03-26 MED ORDER — LIDOCAINE 2% (20 MG/ML) 5 ML SYRINGE
INTRAMUSCULAR | Status: AC
  Filled 2017-03-26: qty 5

## 2017-03-26 MED ORDER — ONDANSETRON HCL 4 MG/2ML IJ SOLN
4.0000 mg | Freq: Once | INTRAMUSCULAR | Status: DC | PRN
Start: 2017-03-26 — End: 2017-03-26

## 2017-03-26 MED ORDER — EPHEDRINE SULFATE-NACL 50-0.9 MG/10ML-% IV SOSY
PREFILLED_SYRINGE | INTRAVENOUS | Status: DC | PRN
Start: 2017-03-26 — End: 2017-03-26
  Administered 2017-03-26: 5 mg via INTRAVENOUS

## 2017-03-26 MED ORDER — EPHEDRINE 5 MG/ML INJ
INTRAVENOUS | Status: AC
  Filled 2017-03-26: qty 10

## 2017-03-26 MED ORDER — SODIUM CHLORIDE 0.9 % IR SOLN
Status: DC | PRN
  Administered 2017-03-26: 6000 mL

## 2017-03-26 MED ORDER — ONDANSETRON HCL 4 MG/2ML IJ SOLN
INTRAMUSCULAR | Status: AC
  Filled 2017-03-26: qty 2

## 2017-03-26 MED ORDER — PROPOFOL 10 MG/ML IV BOLUS
INTRAVENOUS | Status: AC
  Filled 2017-03-26: qty 20

## 2017-03-26 MED ORDER — PROPOFOL 10 MG/ML IV BOLUS
INTRAVENOUS | Status: DC | PRN
  Administered 2017-03-26: 110 mg via INTRAVENOUS

## 2017-03-26 MED ORDER — ACETAMINOPHEN 10 MG/ML IV SOLN
INTRAVENOUS | Status: AC
  Filled 2017-03-26: qty 100

## 2017-03-26 MED ORDER — BELLADONNA ALKALOIDS-OPIUM 16.2-60 MG RE SUPP
RECTAL | Status: DC | PRN
  Administered 2017-03-26: 1 via RECTAL

## 2017-03-26 MED ORDER — ACETAMINOPHEN 500 MG PO TABS
1000.0000 mg | ORAL_TABLET | ORAL | Status: AC
  Administered 2017-03-26: 1000 mg via ORAL
  Filled 2017-03-26: qty 2

## 2017-03-26 MED ORDER — ACETAMINOPHEN 10 MG/ML IV SOLN
1000.0000 mg | Freq: Once | INTRAVENOUS | Status: AC
  Administered 2017-03-26: 1000 mg via INTRAVENOUS

## 2017-03-26 MED ORDER — BELLADONNA ALKALOIDS-OPIUM 16.2-60 MG RE SUPP
RECTAL | Status: AC
  Filled 2017-03-26: qty 1

## 2017-03-26 SURGICAL SUPPLY — 17 items
BAG URINE DRAINAGE (UROLOGICAL SUPPLIES) IMPLANT
BAG URO CATCHER STRL LF (MISCELLANEOUS) ×3 IMPLANT
CATH FOLEY 2WAY SLVR  5CC 16FR (CATHETERS) ×2
CATH FOLEY 2WAY SLVR 5CC 16FR (CATHETERS) ×1 IMPLANT
COVER SURGICAL LIGHT HANDLE (MISCELLANEOUS) ×3 IMPLANT
GLOVE BIOGEL M STRL SZ7.5 (GLOVE) ×3 IMPLANT
GOWN STRL REUS W/TWL LRG LVL3 (GOWN DISPOSABLE) ×3 IMPLANT
GOWN STRL REUS W/TWL XL LVL3 (GOWN DISPOSABLE) ×3 IMPLANT
HOLDER FOLEY CATH W/STRAP (MISCELLANEOUS) IMPLANT
IV NS IRRIG 3000ML ARTHROMATIC (IV SOLUTION) ×6 IMPLANT
LOOP CUT BIPOLAR 24F LRG (ELECTROSURGICAL) IMPLANT
MANIFOLD NEPTUNE II (INSTRUMENTS) ×3 IMPLANT
NS IRRIG 1000ML POUR BTL (IV SOLUTION) ×3 IMPLANT
PACK CYSTO (CUSTOM PROCEDURE TRAY) ×3 IMPLANT
SYRINGE IRR TOOMEY STRL 70CC (SYRINGE) IMPLANT
TUBING CONNECTING 10 (TUBING) ×4 IMPLANT
TUBING CONNECTING 10' (TUBING) ×2

## 2017-03-26 NOTE — Anesthesia Preprocedure Evaluation (Addendum)
Anesthesia Evaluation  Patient identified by MRN, date of birth, ID band Patient awake    Reviewed: Allergy & Precautions, NPO status , Patient's Chart, lab work & pertinent test results, reviewed documented beta blocker date and time   Airway Mallampati: II  TM Distance: >3 FB Neck ROM: Full    Dental  (+) Dental Advisory Given, Lower Dentures, Upper Dentures   Pulmonary former smoker,    Pulmonary exam normal breath sounds clear to auscultation       Cardiovascular hypertension, Pt. on medications and Pt. on home beta blockers + CAD, + Past MI, + CABG and + Peripheral Vascular Disease (s/p bilateral CEA)  Normal cardiovascular exam Rhythm:Regular Rate:Normal     Neuro/Psych Seizures -, Well Controlled,  CVA (Left spastic hemiparesis ), Residual Symptoms negative psych ROS   GI/Hepatic negative GI ROS, Neg liver ROS,   Endo/Other  diabetes (diet controlled), Well Controlled, Type 2  Renal/GU negative Renal ROS   MULTIPLE SMALL BLADDER TUMORS    Musculoskeletal negative musculoskeletal ROS (+)   Abdominal   Peds  Hematology negative hematology ROS (+)   Anesthesia Other Findings Day of surgery medications reviewed with the patient.  Reproductive/Obstetrics                            Anesthesia Physical Anesthesia Plan  ASA: III  Anesthesia Plan: General   Post-op Pain Management:    Induction: Intravenous  PONV Risk Score and Plan: 3 and Ondansetron, Dexamethasone and Propofol  Airway Management Planned: LMA  Additional Equipment:   Intra-op Plan:   Post-operative Plan: Extubation in OR  Informed Consent: I have reviewed the patients History and Physical, chart, labs and discussed the procedure including the risks, benefits and alternatives for the proposed anesthesia with the patient or authorized representative who has indicated his/her understanding and acceptance.    Dental advisory given  Plan Discussed with: CRNA  Anesthesia Plan Comments: (Risks/benefits of general anesthesia discussed with patient including risk of damage to teeth, lips, gum, and tongue, nausea/vomiting, allergic reactions to medications, and the possibility of heart attack, stroke and death.  All patient questions answered.  Patient wishes to proceed.)      Anesthesia Quick Evaluation

## 2017-03-26 NOTE — Transfer of Care (Signed)
Immediate Anesthesia Transfer of Care Note  Patient: [name]  Procedure(s) Performed: Procedure(s): FULGURATION OF BLADDER TUMOR (N/A) CYSTOSCOPY WITH BIOPSY/ (N/A)  Patient Location: PACU  Anesthesia Type:General  Level of Consciousness: awake, alert  and oriented  Airway & Oxygen Therapy: Patient Spontanous Breathing and Patient connected to face mask oxygen  Post-op Assessment: Report given to RN and Post -op Vital signs reviewed and stable  Post vital signs: Reviewed and stable  Last Vitals: There were no vitals filed for this visit.  Last Pain: There were no vitals filed for this visit.    Patients Stated Pain Goal: 3 (92/01/00 7121)  Complications: No apparent anesthesia complications

## 2017-03-26 NOTE — H&P (Signed)
--------------------------------------------------------------------------------   [name]  MRN: [id number]  PRIMARY CARE:  [name], MD  DOB: [birth year], 71 year old Male  REFERRING:  [name], MD  PROVIDER:  Carolan Clines, M.D.    LOCATION:  [facility] [phone]   --------------------------------------------------------------------------------   CC: I have bladder cancer that has been treated.  HPI: [name] is a 71 year-old male established patient who is here for follow-up of bladder cancer treatment.  His bladder cancer was superficial and limitied to the bladder lining. His bladder cancer was not muscle invasive. He had treatment with the following intravesical agents: bcg and mitomycin. Patient denies interferon and adriamycin.   He did have a TURBT. His last bladder tumor was resected 04/17/2016. He has had the following number of bladder resections: 2. He did not have a radical cystectomy for the treatment of his bladder cancer. He did not have chemotherapy for treamtent of his bladder cancer. He did not have radiation to treat his bladder cancer. He is not here for an intravesical treatment today.   His last cysto was 07/15/2016.   Completed BCG x 6 weeks on 07/01/16.     ALLERGIES: Altace - Other Reaction, cough    MEDICATIONS: Aggrenox 25-200 MG Oral Capsule Extended Release 12 Hour Oral  Benicar 20 MG Oral Tablet Oral  Carbidopa-Levodopa 25-100 MG Oral Tablet Oral  CoQ10 50 MG Oral Capsule Oral  Crestor 20 MG Oral Tablet Oral  Dantrolene Sodium 50 MG Oral Capsule Oral  Dilantin 100 MG Oral Capsule Oral  Flaxseed Oil 1000 MG Oral Capsule Oral  Folbic 2.5-25-2 MG Oral Tablet Oral  Keppra 500 MG Oral Tablet Oral  Magnesium 250 MG Oral Tablet Oral  Metoprolol Tartrate 25 MG Oral Tablet Oral  Tricor 48 MG Oral Tablet Oral  Vitamin C TABS Oral  Vitamin D TABS Oral  Zetia 10 MG Oral Tablet Oral     GU PSH: Bladder  Instill AntiCA Agent - 07/01/2016, 06/24/2016, 06/17/2016, 06/10/2016, 06/03/2016, 05/20/2016, 03/18/2016 Cysto Bladder Ureth Biopsy - 11/02/2015 Cystoscopy - 07/15/2016 Cystoscopy TURBT 2-5 cm - 04/17/2016 Vasectomy - 2010    NON-GU PSH: Cholecystectomy - 2010 Coronary Artery Bypass Grafting (cabg) - 2010    GU PMH: Bladder Cancer Lateral - 07/15/2016, Malignant neoplasm of lateral wall of urinary bladder, - 02/06/2016 Bladder Cancer Posterior - 07/15/2016, Malignant neoplasm of posterior wall of urinary bladder, - 02/06/2016 Bladder Cancer Trigone - 07/15/2016 Bladder Cancer, History (Worsening) - 04/22/2016 Urinary Tract Inf, Unspec site, Urinary tract infection - 02/15/2016 Bladder Cancer Anterior, Malignant neoplasm of anterior wall of urinary bladder - 02/06/2016 Gross hematuria, Gross hematuria - 09/17/2015 Elevated PSA, Elevated prostate specific antigen (PSA) - 2014      PMH Notes:  2011-06-03 13:32:37 - Note: Pancreatitis   NON-GU PMH: Cerebral infarction, unspecified, Stroke syndrome - 02/06/2016 Myocardial Infarction, History of myocardial infarction - 02/06/2016 Personal history of other diseases of the circulatory system, History of hypertension - 02/06/2016, History of cardiac disorder, - 08/14/2015 Personal history of other diseases of the nervous system and sense organs, History of seizure disorder - 02/06/2016 Personal history of other endocrine, nutritional and metabolic disease, History of hyperlipidemia - 02/06/2016, History of type 2 diabetes mellitus, - 02/06/2016 Encounter for general adult medical examination without abnormal findings, Encounter for preventive health examination - 11/21/2015 Skin Cancer, History, History of basal cell carcinoma - 08/14/2015    FAMILY HISTORY: Diabetes - Brother Family Health Status - Mother's Age - No Family  History Family Health Status Number - Father Father Deceased At Age29 ___ - Father hyperlipidemia - Sister Hypertension - Brother, Father   SOCIAL  HISTORY: Marital Status: Divorced Current Smoking Status: Patient does not smoke anymore.     REVIEW OF SYSTEMS:    GU Review Male:   Patient denies frequent urination, hard to postpone urination, burning/ pain with urination, get up at night to urinate, leakage of urine, stream starts and stops, trouble starting your stream, have to strain to urinate , erection problems, and penile pain.  Gastrointestinal (Upper):   Patient denies nausea, vomiting, and indigestion/ heartburn.  Gastrointestinal (Lower):   Patient denies diarrhea and constipation.  Constitutional:   no appetite. Patient reports weight loss. Patient denies fever, fatigue, and night sweats.  Skin:   Patient denies skin rash/ lesion and itching.  Eyes:   Patient denies blurred vision and double vision.  Ears/ Nose/ Throat:   Patient denies sore throat and sinus problems.  Hematologic/Lymphatic:   Patient denies swollen glands and easy bruising.  Cardiovascular:   Patient denies leg swelling and chest pains.  Respiratory:   Patient denies cough and shortness of breath.  Endocrine:   Patient denies excessive thirst.  Musculoskeletal:   Patient denies back pain and joint pain.  Neurological:   Patient denies headaches and dizziness.  Psychologic:   Patient denies depression and anxiety.   VITAL SIGNS:      10/24/2016 03:39 PM  BP 124/80 mmHg  Pulse 74 /min  Temperature 98.2 F / 37 C   PAST DATA REVIEWED:  Source Of History:  Patient  Records Review:   Previous Patient Records   12/01/11 05/27/11 12/02/10 04/23/10 01/22/10 10/11/09 09/19/09 07/24/09  PSA  Total PSA 4.85  4.97  4.00  5.07  6.06  5.28  6.97  6.61   Free PSA 0.52    0.66  1.42  0.63  1.39  1.09   % Free PSA 11    13.0  23.4  11.9  19.9  16.5     PROCEDURES:         Flexible Cystoscopy - 52000  Risks, benefits, and some of the potential complications of the procedure were discussed at length with the patient including infection, bleeding, voiding  discomfort, urinary retention, fever, chills, sepsis, and others. All questions were answered. Informed consent was obtained. Antibiotic prophylaxis was given. Sterile technique and intraurethral analgesia were used.  Meatus:  Normal size. Normal location. Normal condition.  Urethra:  No strictures.  External Sphincter:  Normal.  Verumontanum:  Normal.  Prostate:  Non-obstructing. No hyperplasia.  Bladder Neck:  Non-obstructing.  Ureteral Orifices:  Normal location. Normal size. Normal shape. Effluxed clear urine.  Bladder:  A few left lateral wall tumors. A trigone tumor. 1/2 cm tumor. Multifocal tumors. No trabeculation. Normal mucosa. No stones.      The lower urinary tract was carefully examined. The procedure was well-tolerated and without complications. Antibiotic instructions were given. Instructions were given to call the office immediately for bloody urine, difficulty urinating, urinary retention, painful or frequent urination, fever, chills, nausea, vomiting or other illness. The patient stated that he understood these instructions and would comply with them.         Urinalysis Dipstick Dipstick Cont'd  Color: Yellow Bilirubin: Neg  Appearance: Clear Ketones: Trace  Specific Gravity: 1.020 Blood: Neg  pH: 5.5 Protein: Trace  Glucose: 2+ Urobilinogen: 0.2    Nitrites: Neg    Leukocyte Esterase: Neg    ASSESSMENT:  ICD-10 Details  1 GU:   Bladder Cancer Lateral - C67.2   2   Bladder Cancer Posterior - C67.4               Notes:   Cystoscopy shows multiple small tumor studs on the left lateral wall, and the trigone. These are documented. I have advised the patient to have transurethral excisional biopsies of these areas at this time because they are easier to remove at this stage then if they are larger. He should also have Mitomycin C the recovery room. Consider his options, and then call tomorrow to schedule if he desires. We'll take him approximately 3 weeks to get into  the operating room.    PLAN:           Schedule Return Visit/Planned Activity: Other See Visit Notes - Schedule Surgery             Note: Patient's wife to call when ready to schedule cysto/TURBT.          Document Letter(s):  Created for Patient: Clinical Summary    Pt called to report that he has stopped his Aggrenox, but less than the prescribed time pre-operatively. Slight increase in bleeding possibility,  We will continue with pans for his TUR-BT.      The information contained in this medical record document is considered private and confidential patient information. This information can only be used for the medical diagnosis and/or medical services that are being provided by the patient's selected caregivers. This information can only be distributed outside of the patient's care if the patient agrees and signs waivers of authorization for this information to be sent to an outside source or route.

## 2017-03-26 NOTE — Interval H&P Note (Signed)
History and Physical Interval Note:  03/26/2017 9:45 AM  [name]  has presented today for surgery, with the diagnosis of MULTIPLE SMALL BLADDER TUMORS  The various methods of treatment have been discussed with the patient and family. After consideration of risks, benefits and other options for treatment, the patient has consented to  Procedure(s): FULGURATION OF BLADDER TUMOR (N/A) CYSTOSCOPY WITH BIOPSY/ INSTILLATION OF MYTOMYCIN C (N/A) as a surgical intervention .  The patient's history has been reviewed, patient examined, no change in status, stable for surgery.  I have reviewed the patient's chart and labs.  Questions were answered to the patient's satisfaction.     Margee Trentham I Bookert Guzzi Goals: remove multiple superficial bladder tumors Liklihood Success: excellent Alternate Rx: Discussed in office. Smoking cessation, Bladder waxh chemotherapy Disability: catheter for mitomycin, then remove if possible.

## 2017-03-26 NOTE — Op Note (Signed)
Pre-operative diagnosis :   Bladder cancer  Postoperative diagnosis:  Same  Operation: Nonah Mattes biopsy of multiple  ( 3) sessile bladder tumors, 1.0cm; 1.5cm; 1.0cm, located in the left posterior bladder wall and bladder base.  Surgeon:  Chauncey Cruel. Gaynelle Arabian, MD  First assistant: None  Anesthesia:  General LMA  Preparation: After appropriate preanesthesia, the patient was brought to the operative room, placed on the operating table in the dorsal supine position where general LMA anesthesia was introduced. He was then replaced in the dorsal lithotomy position with the pubis was prepped with Betadine solution and draped in usual fashion. The history was double checked. The armband was double checked.  Review history:  HPI: [name] is a 71 year-old male established patient who is here for follow-up of bladder cancer treatment.  His bladder cancer was superficial and limitied to the bladder lining. His bladder cancer was not muscle invasive. He had treatment with the following intravesical agents: bcg and mitomycin. Patient denies interferon and adriamycin.   He did have a TURBT. His last bladder tumor was resected 04/17/2016. He has had the following number of bladder resections: 2. He did not have a radical cystectomy for the treatment of his bladder cancer. He did not have chemotherapy for treamtent of his bladder cancer. He did not have radiation to treat his bladder cancer. He is not here for an intravesical treatment today.   His last cysto was 07/15/2016.   Completed BCG x 6 weeks on 07/01/16.    Statement of  Likelihood of Success: Excellent. TIME-OUT observed.:  Procedure: Cystourethroscopy was accomplished, and photo documentation was accomplished and multiple areas of sessile bladder tumor development, and the bladder base, and the left posterior bladder wall. Using the [facility] biopsy forceps, 3 separate sessile bladder tumors were removed, measuring 1.07 m, 1.57 m, and 1.07 m, from  the bladder base, and the left posterior bladder wall. No bleeding was noted. Following examination of the bladder, the bladder was drained of cystoscopic fluid, and a 16 French Foley catheter was placed, so that the patient may have installation of mitomycin-C in recovery room.  The patient will receive Tylenol in recovery room. In addition, the patient received a B and O suppository in the operating room. He received IV antibiotic preoperatively. He was awakened, and taken to recovery room in good condition.

## 2017-03-26 NOTE — Anesthesia Postprocedure Evaluation (Signed)
Anesthesia Post Note  Patient: [name]  Procedure(s) Performed: Procedure(s) (LRB): FULGURATION OF BLADDER TUMOR (N/A) CYSTOSCOPY WITH BIOPSY/ (N/A)     Anesthesia Post Evaluation  Last Vitals:  Vitals:   03/26/17 1300 03/26/17 1358  BP: (!) 156/61 (!) 163/65  Pulse: (!) 54 (!) 56  Resp: 16 16  Temp: 36.4 C 36.3 C    Last Pain:  Vitals:   03/26/17 0752  TempSrc: Oral                 Catalina Gravel

## 2017-03-26 NOTE — Anesthesia Procedure Notes (Signed)
Procedure Name: LMA Insertion Date/Time: 03/26/2017 10:01 AM Performed by: Danley Danker L Patient Re-evaluated:Patient Re-evaluated prior to inductionOxygen Delivery Method: Circle system utilized Preoxygenation: Pre-oxygenation with 100% oxygen Intubation Type: IV induction Ventilation: Mask ventilation without difficulty LMA: LMA inserted LMA Size: 4.0 Number of attempts: 1 Placement Confirmation: positive ETCO2 and breath sounds checked- equal and bilateral Tube secured with: Tape Dental Injury: Teeth and Oropharynx as per pre-operative assessment

## 2017-03-26 NOTE — Discharge Instructions (Signed)
Bladder Cancer Bladder cancer is an abnormal growth of tissue in the bladder. The bladder is the balloon-like sac in the pelvis. It collects and stores urine that comes from the kidneys through the ureters. The bladder wall is made of layers. If cancer spreads into these layers and through the wall of the bladder, it becomes more difficult to treat. What are the causes? The cause of this condition is not known. What increases the risk? The following factors may make you more likely to develop this condition:  Smoking.  Workplace risks (occupational exposures), such as rubber, leather, textile, dyes, chemicals, and paint.  Being white.  Your age. Most people with bladder cancer are over the age of 14.  Being male.  Having chronic bladder inflammation.  Having a personal history of bladder cancer.  Having a family history of bladder cancer (heredity).  Having had chemotherapy or radiation therapy to the pelvis.  Having been exposed to arsenic.  What are the signs or symptoms? Initial symptoms of this condition include:  Blood in the urine.  Painful urination.  Frequent bladder or urine infections.  Increase in urgency and frequency of urination.  Advanced symptoms of this condition include:  Not being able to urinate.  Low back pain on one side.  Loss of appetite.  Weight loss.  Fatigue.  Swelling in the feet.  Bone pain.  How is this diagnosed? This condition is diagnosed based on your medical history, a physical exam, urine tests, lab tests, imaging tests, and your symptoms. You may also have other tests or procedures done, such as:  A narrow tube being inserted into your bladder through your urethra (cystoscopy) in order to view the lining of your bladder for tumors.  A biopsy to sample the tumor to see if cancer is present.  If cancer is present, it will then be staged to determine its severity and extent. Staging is an assessment of:  The size of the  tumor.  Whether the cancer has spread.  Where the cancer has spread.  It is important to know how deeply into the bladder wall cancer has grown and whether cancer has spread to any other parts of your body. Staging may require blood tests or imaging tests, such as a CT scan, MRI, bone scan, or chest X-ray. How is this treated? Based on the stage of cancer, one treatment or a combination of treatments may be recommended. The most common forms of treatment are:  Surgery to remove the cancer. Procedures that may be done include transurethral resection and cystectomy.  Radiation therapy. This is high-energy X-rays or other particles. This is often used in combination with chemotherapy.  Chemotherapy. During this treatment, medicines are used to kill cancer cells.  Immunotherapy. This uses medicines to help your own immune system destroy cancer cells.  Follow these instructions at home:  Take over-the-counter and prescription medicines only as told by your health care provider.  Maintain a healthy diet. Some of your treatments might affect your appetite.  Consider joining a support group. This may help you learn to cope with the stress of having bladder cancer.  Tell your cancer care team if you develop side effects. They may be able to recommend ways to relieve them.  Keep all follow-up visits as told by your health care provider. This is important. Where to find more information:  [employer]: www.cancer.[place]): www.cancer.gov Contact a health care provider if:  You have symptoms of a urinary  tract infection. These include: °? Fever. °? Chills. °? Weakness. °? Muscle aches. °? Abdominal pain. °? Frequent and intense urge to urinate. °? Burning feeling in the bladder or urethra during urination. °Get help right away if: °· There is blood in your urine. °· You cannot urinate. °· You have severe pain or other symptoms that do not go  away. °Summary °· Bladder cancer is an abnormal growth of tissue in the bladder. °· This condition is diagnosed based on your medical history, a physical exam, urine tests, lab tests, imaging tests, and your symptoms. °· Based on the stage of cancer, surgery, chemotherapy, or a combination of treatments may be recommended. °· Consider joining a support group. This may help you learn to cope with the stress of having bladder cancer. °This information is not intended to replace advice given to you by your health care provider. Make sure you discuss any questions you have with your health care provider. °Document Released: 10/02/2003 Document Revised: 09/02/2016 Document Reviewed: 09/02/2016 °Elsevier Interactive Patient Education © 2017 [employer] ° °

## 2017-03-27 ENCOUNTER — Encounter (HOSPITAL_COMMUNITY): Payer: Self-pay | Admitting: Urology

## 2017-03-27 NOTE — Op Note (Signed)
Pre-operative diagnosis : bladder cancer, superficial, recurrent  Postoperative diagnosis:  same  Operation: instillation of Mitomycin-C in recovery Room  Surgeon:  S. Gaynelle Arabian, MD  First assistant: PAR RN  Anesthesia:  none  Preparation: With the patient in the supine position, awake and alert, recovering from his general LMA anesthesia, the penis and foley catheter were identified. Time out was instituted. The area was draped with sterile towels. Gowns, eyeshields, and gloves were worn by both surgeon and assistant.   Review history:  71 yo male with recurrent low grade bladder cancer, now for Mitomycin-C instillation.   Statement of  Likelihood of Success: Excellent. TIME-OUT observed.:  Procedure: The product  was identified as freshly made and delivered from pharmacy. Under clean and isolated conditions, the foley catheter was disconnected from its drainage bag, and the bag discarded. The entire 40mg  in 40cc sterile water of Mitomycin-C was instilled, and left for 1 hr. His bladder was irrigated with sterile water and the catheter was removed and discarded appropriately. The procedure was terminated.

## 2017-05-04 ENCOUNTER — Encounter: Payer: Self-pay | Admitting: Diagnostic Neuroimaging

## 2017-05-04 ENCOUNTER — Ambulatory Visit (INDEPENDENT_AMBULATORY_CARE_PROVIDER_SITE_OTHER): Payer: Medicare Other | Admitting: Diagnostic Neuroimaging

## 2017-05-04 VITALS — BP 160/76 | HR 61 | Ht 67.0 in | Wt 155.6 lb

## 2017-05-04 DIAGNOSIS — G40909 Epilepsy, unspecified, not intractable, without status epilepticus: Secondary | ICD-10-CM

## 2017-05-04 DIAGNOSIS — G20 Parkinson's disease: Secondary | ICD-10-CM | POA: Diagnosis not present

## 2017-05-04 DIAGNOSIS — G250 Essential tremor: Secondary | ICD-10-CM | POA: Diagnosis not present

## 2017-05-04 DIAGNOSIS — I6930 Unspecified sequelae of cerebral infarction: Secondary | ICD-10-CM | POA: Diagnosis not present

## 2017-05-04 MED ORDER — DILANTIN 100 MG PO CAPS: 100.0000 mg | ORAL_CAPSULE | Freq: Three times a day (TID) | ORAL | 4 refills | Status: DC

## 2017-05-04 MED ORDER — CARBIDOPA-LEVODOPA 25-100 MG PO TABS: 2.0000 | ORAL_TABLET | Freq: Three times a day (TID) | ORAL | 4 refills | Status: DC

## 2017-05-04 MED ORDER — KEPPRA 500 MG PO TABS: 500.0000 mg | ORAL_TABLET | Freq: Two times a day (BID) | ORAL | 4 refills | Status: DC

## 2017-05-04 MED ORDER — ASPIRIN-DIPYRIDAMOLE ER 25-200 MG PO CP12: 1.0000 | ORAL_CAPSULE | Freq: Two times a day (BID) | ORAL | 4 refills | Status: DC

## 2017-05-04 NOTE — Progress Notes (Signed)
[facility]  PATIENT: [name] DOB: Sep 22, 1946  REFERRING CLINICIAN:  HISTORY FROM: patient and wife REASON FOR VISIT: follow up   HISTORICAL  CHIEF COMPLAINT:  Chief Complaint  Patient presents with  . Follow-up  . Tremors  . hx stroke    had stroke? since last seen.  (when had bladder surgery) 03/26/17.  Noted more L sided defecit,   . Seizures    none.    HISTORY OF PRESENT ILLNESS:   UPDATE 05/04/17: Since last visit, had had confusion and weakness following bladder surgery, lasting 11 days. Now back to baseline. Taking carb/levo 2 tabs TID (2 tabs 7am, 1 tab 11:00, 1 tab 2:30pm, 1 tab 6:30pm, 1 tab 9pm).   UPDATE 11/03/16: Since last visit, voice is more hoarse. More trouble with blood sugar regulation. More falls. New rash on right face and left back.   UPDATE 04/22/16: Since last visit, postural tremor is slightly worse; resting tremor is stable. More freezing with gait initiation. No falls. Had 1 more bladder cancer surgery on 04/17/16.   UPDATE 10/23/15: Since last visit, overall stable; but unfortunately dx'd with bladder CA, s/p resection. Stable on meds. Tremor stable.   UPDATE 04/19/15: Since last visit, doing better. Less choking on saliva. Postural and action tremor stable. Balance stable. Mainly with postural and action tremor.   UPDATE 12/18/14: Since last visit, stable. On carb/levo half tab TID. May have had some improvement in tremor. Still using single point cane. No falls since last visit.   UPDATE 09/13/14: Since last visit, symptoms are stable. Still falling. More problems due to right sided slowness and chronic left hemiparesis. Asking about parkinson's dz meds, diagnosis, treatment, supplements.   UPDATE 03/08/14: Since last visit, no seizures. Has been falling more. Has noticed hand writing deterioration over past 3 years. Also intermittent right hand tremor x 3-6 months (per wife). Patient noted some right hand tremor even in  1999.  UPDATE 03/08/13: Since last visit patient doing well. No seizures since 2005. Patient is stable on Dilantin and Keppra brand name medications. Patient's swallowing has improved since last visit. He make sure to take liquids following each mouthful of food.  PRIOR HPI (08/27/12, Dr. Erling Cruz): 71 year old right-handed white divorced male from [place], [place] who is cared for by his ex-first wife. He has a known history of hypertension, right MCA stroke 3/99 associated with residual left hemiparesis, acute right MCA stroke 09/2003,coronary artery disease status post 3 vessel bypass in 2004, bilateral carotid endarterectomies in 1999 and bilateral stent placement 5/12, recurrent TIAs versus seizures, documented seizure 11/12/2006, and Dilantin toxicity. His last MRI of the brain without contrast 11/13/2006 showed a large chronic right middle artery infarct involving the posterior temporal and parietal lobe and right thalamus. MR angiogram of the head without contrast showed intracranial atherosclerotic disease and occlusion of the right middle cerebral artery branch corresponding to the right MCA infarct. There was mild atherosclerotic vascular disease elsewhere and no aneurysm. Doppler study of the carotids 12/16/07 was negative for hemodynamically significant stenosis in the intracranial arteries.  EEG 11/13/2006 showed rhythmic discharges correlating with the EKG and were thought to be artifact. . His last seizure was 11/18/2006. He subsequently had one episode of confusion 11/26/2009 which might have been a seizure. He was at his son's house, came home, looked in the  driveway, and asked where his car was. His car had been given to someone else in the family. He kept asking about the car  and recognized that he was confused. The episode lasted 40 minutes. The patient lives with his caretaker. He is able to bathe himself and  take care of his toileting needs, but requires assistance in dressing. He  exercises  at the [facility] 2 days per week. He has falls with a fall assessment tool score of 10. He denies macropsia, micropsia, strange odors or tastes. His caretaker reports that at times he has left knee hyperextension. He uses a left leg brace. He denies TIA symptoms or warnings of seizures.He has finished 2 months of PT in no rehabilitation and has a new left foot brace, which is not associated with hyperextension of his knee. He denies spasticity or spasms. He enjoys playing "We" 2 times per day for exercise.His wife reports that he chokes on his saliva. He has not had  unexplained fever or chills. Last trough levetiracetam level 15.9  and phenytoin level 13.4 on 03/11/2012.  REVIEW OF SYSTEMS: Full 14 system review of systems performed and negative except: only as per HPI.     ALLERGIES: Allergies  Allergen Reactions  . Altace [Ramipril] Cough    HOME MEDICATIONS: Outpatient Medications Prior to Visit  Medication Sig Dispense Refill  . AGGRENOX 25-200 MG 12hr capsule TAKE 1 CAPSULE BY MOUTH 2 (TWO) TIMES DAILY. 180 capsule 3  . Ascorbic Acid (VITAMIN C) 1000 MG tablet Take 1,000 mg by mouth daily.    Marland Kitchen BENICAR 20 MG tablet TAKE 1 TABLET BY MOUTH DAILY 90 tablet 1  . carbidopa-levodopa (SINEMET IR) 25-100 MG tablet Take 2 tablets by mouth 3 (three) times daily. 540 tablet 4  . cholecalciferol (VITAMIN D) 1000 units tablet Take 1,000 Units by mouth daily with breakfast.    . Coenzyme Q10 (COQ-10) 50 MG CAPS Take 50 mg by mouth 2 (two) times daily.     . dantrolene (DANTRIUM) 50 MG capsule TAKE 1 CAPSULE BY MOUTH TWICE DAILY 180 capsule 3  . DILANTIN 100 MG ER capsule Take 1 capsule (100 mg total) by mouth 3 (three) times daily. 270 capsule 4  . ezetimibe (ZETIA) 10 MG tablet TAKE ONE TABLET (10MG ) BY MOUTH DAILY 90 tablet 2  . fenofibrate (TRICOR) 48 MG tablet TAKE ONE TABLET BY MOUTH DAILY 90 tablet 2  . Flaxseed, Linseed, 1000 MG CAPS Take 1,000 mg by mouth every evening.     Marland Kitchen FOLBIC 2.5-25-2 MG  TABS tablet TAKE 1 TABLET DAILY WITH FOOD 90 tablet 3  . insulin lispro (HUMALOG KWIKPEN) 100 UNIT/ML KiwkPen Inject 4 Units into the skin See admin instructions. Takes 3 times daily before meals for sugar over 200 only    . KEPPRA 500 MG tablet Take 1 tablet (500 mg total) by mouth 2 (two) times daily. 180 tablet 4  . metoprolol tartrate (LOPRESSOR) 25 MG tablet TAKE ONE TABLET BY MOUTH DAILY (KEEP OFFICE VISIT) (Patient taking differently: TAKE ONE TABLET BY MOUTH DAILY (KEEP OFFICE VISIT) at bedtime) 90 tablet 2  . nitroGLYCERIN (NITROSTAT) 0.4 MG SL tablet Place 1 tablet (0.4 mg total) under the tongue every 5 (five) minutes as needed for chest pain. 25 tablet 3  . rosuvastatin (CRESTOR) 20 MG tablet Take 1.5 tablets (30 mg total) by mouth daily. 135 tablet 3  . phenazopyridine (PYRIDIUM) 200 MG tablet Take 1 tablet (200 mg total) by mouth 3 (three) times daily as needed for pain. (Patient not taking: Reported on 05/04/2017) 10 tablet 0  . rosuvastatin (CRESTOR) 20 MG tablet Take 30 mg by mouth  at bedtime.    . traMADol-acetaminophen (ULTRACET) 37.5-325 MG tablet Take 1 tablet by mouth every 6 (six) hours as needed. (Patient not taking: Reported on 05/04/2017) 30 tablet 2   No facility-administered medications prior to visit.     PAST MEDICAL HISTORY: Past Medical History:  Diagnosis Date  . At high risk for falls   . Bladder cancer [facility])    urologist-  dr Gaynelle Arabian  . CAD (coronary artery disease) cardiologist-  dr berry  . Cerebrovascular arteriosclerosis   . Gait disturbance, post-stroke    uses cane, rollator, and w/c long distance  . Hemiparkinsonism [facility]) neurologist-- dr Leta Baptist   right side body  . History of basal cell carcinoma excision    Sept 2016--  MOH's surgery side of nose  . History of carotid artery stenosis cardiologist-- dr berry   bilateral --  1999 s/p  bilateral ICA endarterectomy and recurrent restenosis 2012  s/p  staged bilateral stenting   per last duplex  2015  stents widely patent  . History of CVA with residual deficit neurologist-  dr Leta Baptist   3/ 1999  Right MCA  and  12/ 2004  Anterior division of  Right MCA ---  residual left spactic hemiparesis and left foot drop  . History of MI (myocardial infarction)    02/ 2004   s/p  cabg   . HOH (hard of hearing)    LEFT EAR  POST cva  . Hyperextension deformity of left knee    wears brace  . Hyperlipidemia   . Hypertension   . Left foot drop    residual from CVA  -- wears brace  . Left spastic hemiparesis ([place])    residual CVA 1999  . Pancreatic pseudocyst   . Peripheral arterial disease ([place])   . S/P CABG x 4    02/ 2004  . Seizure disorder [facility]) last seizure 2011 due to confusion   neurologist-  dr Leta Baptist-- per note seizure documented 2008 breakthrough partial complex seizure (prior tonic-clonic seizure's post CVA)  . Type 2 diabetes, diet controlled ([place])     2  . Visual neglect    LEFT EYE    PAST SURGICAL HISTORY: Past Surgical History:  Procedure Laterality Date  . CARDIAC CATHETERIZATION  11/17/2002   significant left main disease and 3 vessel CAD, mildly depressed LV systolic  fx, 56% left renal artery stenosis  . CAROTID ENDARTERECTOMY  12/1997   [facility])   Bilateral ICA  . CAROTID STENT INSERTION Bilateral dr berry and dr Kristine Royal--  (In-stent Restenosis)  LeftICA   02-12-2011;  [place]   02-26-2011  . CORONARY ARTERY BYPASS GRAFT  2/ 2004   dr hendrickson   x3 LIMA to LAD,free right internal mammary artery to obtuse marginal 1,SVG to distal right coronary.  . CYSTOSCOPY W/ RETROGRADES Bilateral 09/10/2015   Procedure: CYSTOSCOPY WITH RETROGRADE PYELOGRAM;  Surgeon: Carolan Clines, MD;  Location: [facility];  Service: Urology;  Laterality: Bilateral;  . CYSTOSCOPY WITH BIOPSY N/A 09/10/2015   Procedure: CYSTOSCOPY WITH BIOPSY,RIGHT TRIGONAL TUMOR 1.5 CM, EXCISIONAL BIOPSY WITH TAUBER FORCEP RIGHT POSTERIOR BLADDER WALL 1 CM, EXCISIONAL  BIOPSY SATELITE BLADDER WALL 1 CM;  Surgeon: Carolan Clines, MD;  Location[facility];  Service: Urology;  Laterality: N/A;  . CYSTOSCOPY WITH BIOPSY N/A 03/26/2017   Procedure: CYSTOSCOPY WITH BIOPSY/;  Surgeon: Carolan Clines, MD;  Location: [facility];  Service: Urology;  Laterality: N/A;  . CYSTOSCOPY WITH HYDRODISTENSION AND  BIOPSY N/A 10/29/2015   Procedure: REPEAT CYSTOSCOPY BIOPSY BLADDER DEEP MUSCLES BLADDER BIOPSY;  Surgeon: Carolan Clines, MD;  Location: [facility];  Service: Urology;  Laterality: N/A;  . CYSTOSCOPY WITH URETHRAL DILATATION N/A 09/10/2015   Procedure: CYSTOSCOPY WITH URETHRAL DILATATION;  Surgeon: Carolan Clines, MD;  Location: Home;  Service: Urology;  Laterality: N/A;  . FULGURATION OF BLADDER TUMOR N/A 03/26/2017   Procedure: FULGURATION OF BLADDER TUMOR;  Surgeon: Carolan Clines, MD;  Location: [facility];  Service: Urology;  Laterality: N/A;  . [place]  . MOHS SURGERY  06-20-2015   side of nose  . NM MYOCAR PERF WALL MOTION  01/09/2011   dr berry   mild to mod. perfusion defect in the basal inferolateral & mid inferolaterl regions consistant with an infarct/scar, no signigicant ischemia demonstracted/  Low Risk scan, no sig. change from previous study/  normal LV function and wall motion , ef 63%  . ORIF RIGHT ANKLE FX  1998   hardware retained  . TRANSURETHRAL RESECTION OF BLADDER TUMOR N/A 09/10/2015   Procedure: CYSTO TRANSURETHRAL RESECTION OF BLADDER TUMOR (TURBT) OF LEFT POSTERIOR BLADDER TUMOR 2 CM;  Surgeon: Carolan Clines, MD;  Location: [facility];  Service: Urology;  Laterality: N/A;  . TRANSURETHRAL RESECTION OF BLADDER TUMOR Left 04/17/2016   Procedure: TRANSURETHRAL RESECTION OF BLADDER TUMOR (TURBT);  Surgeon: Carolan Clines, MD;  Location: [facility];  Service: Urology;  Laterality: Left;  . US ECHOCARDIOGRAPHY  01/03/2011   normal LVF,  ef>55%/  mild LAE/ trace MR and TR,  mild AV sclerosis without stenosis    FAMILY HISTORY: Family History  Problem Relation Age of Onset  . Stroke Father   . Heart attack Father   . Stroke Mother   . Diabetes Brother   . Hyperlipidemia Brother   . Hypertension Brother   . Hyperlipidemia Sister   . Hypertension Sister     SOCIAL HISTORY:  Social History   Social History  . Marital status: Divorced    Spouse name: N/A  . Number of children: 4  . Years of education: HS   Occupational History  .  Retired   Social History Main Topics  . Smoking status: Former Smoker    Years: 5.00    Types: Cigarettes    Quit date: 11/14/2007  . Smokeless tobacco: Never Used  . Alcohol use No  . Drug use: No  . Sexual activity: Not on file   Other Topics Concern  . Not on file   Social History Narrative   Patient lives at home with his caregiver/ex-wife.   Caffeine- 2 cups of coffee daily     PHYSICAL EXAM  Vitals:   05/04/17 1114  BP: (!) 160/76  Pulse: 61  Weight: 155 lb 9.6 oz (70.6 kg)  Height: 5\' 7"  (1.702 m)    Not recorded     Wt Readings from Last 3 Encounters:  05/04/17 155 lb 9.6 oz (70.6 kg)  03/26/17 152 lb (68.9 kg)  11/03/16 162 lb (73.5 kg)   Body mass index is 24.37 kg/m.  GENERAL EXAM: - Patient is in no distress; MASKED FACIES  CARDIOVASCULAR: Regular rate and rhythm, no murmurs, no carotid bruits  NEUROLOGIC: MENTAL STATUS: awake, alert, SLIGHTLY DECR FLUENCY. Comprehension intact, naming intact CRANIAL NERVE: pupils equal and reactive to light, visual fields full to confrontation, extraocular muscles intact, no nystagmus, facial sensation symmetric; DECR LEFT LOWER FACIAL STRENGTH. MILD DYSARTHRIA uvula midline,  shoulder shrug symmetric, tongue midline. MOTOR: INCR TONE IN LUE AND LLE. LUE 1-2/5 PROX, 0 DISTAL. LLE 3/5 WITH FOOT DROP. VERY RARE REST TREMOR IN RUE. MILD RUE POSTURAL TREMOR. MILD BRADYKINESIA WITH RUE AND RLE.  SENSORY: DECR ON  LEFT SIDE COORDINATION: finger-nose-finger, fine finger movements SLOW ON RIGHT. LIMITED ON LEFT DUE TO WEAKNESS. REFLEXES: BRISK IN LUE AND LLE. GAIT/STATION: LEFT HEMIPARIETIC GAIT; IN WHEELCHAIR; DIFF STANDING     DIAGNOSTIC DATA (LABS, IMAGING, TESTING) - I reviewed patient records, labs, notes, testing and imaging myself where available.  Lab Results  Component Value Date   WBC 8.1 03/23/2017   HGB 13.4 03/23/2017   HCT 39.7 03/23/2017   MCV 97.1 03/23/2017   PLT 198 03/23/2017      Component Value Date/Time   NA 140 03/23/2017 1217   K 5.0 03/23/2017 1217   CL 107 03/23/2017 1217   CO2 26 03/23/2017 1217   GLUCOSE 121 (H) 03/23/2017 1217   BUN 27 (H) 03/23/2017 1217   CREATININE 1.13 03/23/2017 1217   CREATININE 1.11 07/13/2013 0814   CALCIUM 9.6 03/23/2017 1217   PROT 7.4 03/23/2017 1217   ALBUMIN 4.3 03/23/2017 1217   AST 22 03/23/2017 1217   ALT 5 (L) 03/23/2017 1217   ALKPHOS 57 03/23/2017 1217   BILITOT 0.4 03/23/2017 1217   GFRNONAA >60 03/23/2017 1217   GFRAA >60 03/23/2017 1217   Lab Results  Component Value Date   CHOL 264 (H) 10/22/2016   HDL 29 (L) 10/22/2016   LDLCALC NOT CALC 10/22/2016   LDLDIRECT 78 10/22/2016   TRIG 931 (H) 10/22/2016   CHOLHDL 9.1 (H) 10/22/2016   Lab Results  Component Value Date   HGBA1C 6.4 (H) 03/23/2017   Lab Results  Component Value Date   [place] (H) 05/23/2011   Lab Results  Component Value Date   TSH 3.245 07/13/2013    11/14/13 Carotid u/s - mild right CCA stenosis (< 50%) - patent bilateral carotid stents  10/19/14 MRI BRAIN 1. Chronic right hemispheric ischemic infarction. 2. Mild chronic small vessel ischemic disease. 3. No acute findings. No change from MRI on 11/13/06.     ASSESSMENT AND PLAN  71 y.o. year old male  has a past medical history of At high risk for falls; Bladder cancer [facility]); CAD (coronary artery disease) (cardiologist-  dr berry); Cerebrovascular arteriosclerosis; Gait  disturbance, post-stroke; Hemiparkinsonism [facility]) (neurologist-- dr Leta Baptist); History of basal cell carcinoma excision; History of carotid artery stenosis (cardiologist-- dr berry); History of CVA with residual deficit (neurologist-  dr Leta Baptist); History of MI (myocardial infarction); HOH (hard of hearing); Hyperextension deformity of left knee; Hyperlipidemia; Hypertension; Left foot drop; Left spastic hemiparesis ([place]); Pancreatic pseudocyst; Peripheral arterial disease ([place]); S/P CABG x 4; Seizure disorder ([place]) (last seizure 2011 due to confusion); Type 2 diabetes, diet controlled ([place]); and Visual neglect. here with history of right MCA stroke (1999) and seizure disorder. Patient is doing well on brand name Keppra and Dilantin. Now with right body hemiparkinsonism since 2013. May also have had essential tremor since the 1960's (in retrospect).    Dx:  Parkinsonism, unspecified Parkinsonism type ([place])  Chronic ischemic right MCA stroke  Seizure disorder ([place])  Essential tremor     PLAN:  I spent 40 minutes of face to face time with patient. Greater than 50% of time was spent in counseling and coordination of care with patient. In summary we discussed:   POST-OP DELIRIUM AND WEAKNESS - now improving  POST  STROKE SEIZURE DISORDER (last seizure 2009) - continue brand name dilantin and keppra for seizure control - consider reducing dilantin to reduce polypharmacy  PARKINSONISM - continue carb/levo 6 tabs per day (for parkinsonism symptoms)  - (2 tabs 7am, 1 tab 11:00, 1 tab 2:30pm, 1 tab 6:30pm, 1 tab 9pm)  ESSENTIAL TREMOR - could consider primidone in future for essential tremor, but difficulty due to polypharmacy  FALL RISK - high fall risk due to combination of chronic right MCA stroke and right body hemiparkinsonism; use wheelchair and rollator walker with seat   Meds ordered this encounter  Medications  . KEPPRA 500 MG tablet    Sig: Take 1 tablet (500 mg total) by  mouth 2 (two) times daily.    Dispense:  180 tablet    Refill:  4  . carbidopa-levodopa (SINEMET IR) 25-100 MG tablet    Sig: Take 2 tablets by mouth 3 (three) times daily.    Dispense:  540 tablet    Refill:  4  . DILANTIN 100 MG ER capsule    Sig: Take 1 capsule (100 mg total) by mouth 3 (three) times daily.    Dispense:  270 capsule    Refill:  4  . dipyridamole-aspirin (AGGRENOX) 200-25 MG 12hr capsule    Sig: Take 1 capsule by mouth 2 (two) times daily.    Dispense:  180 capsule    Refill:  4   Return in about 1 year (around 05/04/2018).     Penni Bombard, MD 11/26/863, 78:46 AM Certified in Neurology, Neurophysiology and Neuroimaging  [facility] [address], [place], [place] [address] [phone]

## 2017-05-25 ENCOUNTER — Other Ambulatory Visit: Payer: Self-pay | Admitting: Cardiovascular Disease

## 2017-05-26 MED ORDER — OLMESARTAN MEDOXOMIL 20 MG PO TABS: 20.0000 mg | ORAL_TABLET | Freq: Every day | ORAL | 1 refills | Status: DC

## 2017-05-26 NOTE — Addendum Note (Signed)
Addended by: Waylan Rocher on: 05/26/2017 08:19 AM   Modules accepted: Orders

## 2017-06-29 ENCOUNTER — Other Ambulatory Visit: Payer: Self-pay | Admitting: Diagnostic Neuroimaging

## 2017-08-15 ENCOUNTER — Other Ambulatory Visit: Payer: Self-pay | Admitting: Cardiovascular Disease

## 2017-08-17 NOTE — Telephone Encounter (Signed)
REFILL 

## 2017-08-19 ENCOUNTER — Encounter: Payer: Self-pay | Admitting: Physical Medicine & Rehabilitation

## 2017-08-19 ENCOUNTER — Encounter: Payer: Medicare Other | Attending: Physical Medicine & Rehabilitation | Admitting: Physical Medicine & Rehabilitation

## 2017-08-19 VITALS — BP 150/79 | HR 59

## 2017-08-19 DIAGNOSIS — G8194 Hemiplegia, unspecified affecting left nondominant side: Secondary | ICD-10-CM | POA: Diagnosis not present

## 2017-08-19 DIAGNOSIS — Z09 Encounter for follow-up examination after completed treatment for conditions other than malignant neoplasm: Secondary | ICD-10-CM | POA: Insufficient documentation

## 2017-08-19 DIAGNOSIS — Z85828 Personal history of other malignant neoplasm of skin: Secondary | ICD-10-CM | POA: Insufficient documentation

## 2017-08-19 DIAGNOSIS — C679 Malignant neoplasm of bladder, unspecified: Secondary | ICD-10-CM | POA: Insufficient documentation

## 2017-08-19 DIAGNOSIS — I2510 Atherosclerotic heart disease of native coronary artery without angina pectoris: Secondary | ICD-10-CM | POA: Diagnosis not present

## 2017-08-19 DIAGNOSIS — G8110 Spastic hemiplegia affecting unspecified side: Secondary | ICD-10-CM | POA: Diagnosis not present

## 2017-08-19 DIAGNOSIS — I6930 Unspecified sequelae of cerebral infarction: Secondary | ICD-10-CM | POA: Diagnosis not present

## 2017-08-19 DIAGNOSIS — E785 Hyperlipidemia, unspecified: Secondary | ICD-10-CM | POA: Diagnosis not present

## 2017-08-19 DIAGNOSIS — G40909 Epilepsy, unspecified, not intractable, without status epilepticus: Secondary | ICD-10-CM | POA: Insufficient documentation

## 2017-08-19 DIAGNOSIS — Z87891 Personal history of nicotine dependence: Secondary | ICD-10-CM | POA: Insufficient documentation

## 2017-08-19 DIAGNOSIS — G20 Parkinson's disease: Secondary | ICD-10-CM

## 2017-08-19 DIAGNOSIS — I6389 Other cerebral infarction: Secondary | ICD-10-CM | POA: Insufficient documentation

## 2017-08-19 DIAGNOSIS — E1151 Type 2 diabetes mellitus with diabetic peripheral angiopathy without gangrene: Secondary | ICD-10-CM | POA: Diagnosis not present

## 2017-08-19 DIAGNOSIS — I252 Old myocardial infarction: Secondary | ICD-10-CM | POA: Insufficient documentation

## 2017-08-19 NOTE — Patient Instructions (Signed)
PLEASE FEEL FREE TO CALL OUR OFFICE WITH ANY PROBLEMS OR QUESTIONS ([phone])      

## 2017-08-19 NOTE — Progress Notes (Signed)
Subjective:    Patient ID: [name], male    DOB: 1945/10/26, 71 y.o.   MRN: [id number]  HPI   Labrandon Knoch is here in follow up of his right CVA with spastic left hemiparesis.  He is also diagnosed with Parkinson's disease about a year and a half ago.  Since I last saw him in November 2017 he has undergone treatment for bladder cancer and has done quite well.  He still in maintenance phase of the program but per his wife has no new lesions.  He is lost about 40 pounds over the last year and seen improvement in his blood glucose levels.  He is off medications currently as a result.  They do have some concerns regarding increased Parkinson's symptoms including increased rigidity problems with gait and posture.  He is currently taking Sinemet 25/102 tablets in the morning and afternoon and 1 tablet in the evening and at bedtime.  Wife notes a definite increase in his rigidity when it is time to take the next dose.   He remains on Dantrium for his spasticity which works well for him.   Pain Inventory Average Pain 0 Pain Right Now 0 My pain is na  In the last 24 hours, has pain interfered with the following? General activity 0 Relation with others 0 Enjoyment of life 0 What TIME of day is your pain at its worst? na Sleep (in general) Fair  Pain is worse with: na Pain improves with: na Relief from Meds: na  Mobility walk with assistance use a walker ability to climb steps?  yes do you drive?  yes use a wheelchair  Function retired  Neuro/Psych bladder control problems weakness numbness tremor tingling trouble walking  Prior Studies Any changes since last visit?  yes  Physicians involved in your care Any changes since last visit?  yes   Family History  Problem Relation Age of Onset  . Stroke Father   . Heart attack Father   . Stroke Mother   . Diabetes Brother   . Hyperlipidemia Brother   . Hypertension Brother   . Hyperlipidemia Sister   .  Hypertension Sister    Social History   Socioeconomic History  . Marital status: Divorced    Spouse name: None  . Number of children: 4  . Years of education: HS  . Highest education level: None  Social Needs  . Financial resource strain: None  . Food insecurity - worry: None  . Food insecurity - inability: None  . Transportation needs - medical: None  . Transportation needs - non-medical: None  Occupational History    Employer: RETIRED  Tobacco Use  . Smoking status: Former Smoker    Years: 5.00    Types: Cigarettes    Last attempt to quit: 11/14/2007    Years since quitting: 9.7  . Smokeless tobacco: Never Used  Substance and Sexual Activity  . Alcohol use: No  . Drug use: No  . Sexual activity: None  Other Topics Concern  . None  Social History Narrative   Patient lives at home with his caregiver/ex-wife.   Caffeine- 2 cups of coffee daily   Past Surgical History:  Procedure Laterality Date  . CARDIAC CATHETERIZATION  11/17/2002   significant left main disease and 3 vessel CAD, mildly depressed LV systolic  fx, 85% left renal artery stenosis  . CAROTID ENDARTERECTOMY  12/1997   [facility])   Bilateral ICA  . CAROTID STENT INSERTION Bilateral dr berry and  dr Kristine Royal--  (In-stent Restenosis)  LeftICA   02-12-2011;  RightICA   02-26-2011  . CORONARY ARTERY BYPASS GRAFT  2/ 2004   dr hendrickson   x3 LIMA to LAD,free right internal mammary artery to obtuse marginal 1,SVG to distal right coronary.  Marland Kitchen LAPAROSCOPIC CHOLECYSTECTOMY  1998  . MOHS SURGERY  06-20-2015   side of nose  . NM MYOCAR PERF WALL MOTION  01/09/2011   dr berry   mild to mod. perfusion defect in the basal inferolateral & mid inferolaterl regions consistant with an infarct/scar, no signigicant ischemia demonstracted/  Low Risk scan, no sig. change from previous study/  normal LV function and wall motion , ef 63%  . ORIF RIGHT ANKLE FX  1998   hardware retained  . US ECHOCARDIOGRAPHY  01/03/2011    normal LVF, ef>55%/  mild LAE/ trace MR and TR,  mild AV sclerosis without stenosis   Past Medical History:  Diagnosis Date  . At high risk for falls   . Bladder cancer [facility])    urologist-  dr Gaynelle Arabian  . CAD (coronary artery disease) cardiologist-  dr berry  . Cerebrovascular arteriosclerosis   . Gait disturbance, post-stroke    uses cane, rollator, and w/c long distance  . Hemiparkinsonism [facility]) neurologist-- dr Leta Baptist   right side body  . History of basal cell carcinoma excision    Sept 2016--  MOH's surgery side of nose  . History of carotid artery stenosis cardiologist-- dr berry   bilateral --  1999 s/p  bilateral ICA endarterectomy and recurrent restenosis 2012  s/p  staged bilateral stenting   per last duplex 2015  stents widely patent  . History of CVA with residual deficit neurologist-  dr Leta Baptist   3/ 1999  Right MCA  and  12/ 2004  Anterior division of  Right MCA ---  residual left spactic hemiparesis and left foot drop  . History of MI (myocardial infarction)    02/ 2004   s/p  cabg   . HOH (hard of hearing)    LEFT EAR  POST cva  . Hyperextension deformity of left knee    wears brace  . Hyperlipidemia   . Hypertension   . Left foot drop    residual from CVA  -- wears brace  . Left spastic hemiparesis ([place])    residual CVA 1999  . Pancreatic pseudocyst   . Peripheral arterial disease ([place])   . S/P CABG x 4    02/ 2004  . Seizure disorder [facility]) last seizure 2011 due to confusion   neurologist-  dr Leta Baptist-- per note seizure documented 2008 breakthrough partial complex seizure (prior tonic-clonic seizure's post CVA)  . Type 2 diabetes, diet controlled ([place])     2  . Visual neglect    LEFT EYE   There were no vitals taken for this visit.  Opioid Risk Score:   Fall Risk Score:  `1  Depression screen PHQ 2/9  No flowsheet data found.   Review of Systems  Constitutional: Negative.   HENT: Negative.   Eyes: Negative.   Respiratory: Negative.     Cardiovascular: Negative.   Gastrointestinal: Negative.   Endocrine: Negative.   Genitourinary: Negative.   Musculoskeletal: Negative.   Skin: Negative.   Allergic/Immunologic: Negative.   Neurological: Negative.   Hematological: Negative.   Psychiatric/Behavioral: Negative.   All other systems reviewed and are negative.      Objective:   Physical Exam  Constitutional: He  is oriented to person, place, and time. He appears well-developed and well-nourished.  HENT:  Head: AT,[place] Right Ear: External ear normal.  Left Ear: External ear normal.  Nose: Nose normal.  Mouth/Throat: Oropharynx is clear and moist.  Eyes: Conjunctivae and EOM are normal. Pupils are equal, round, and reactive to light.  Neck: Normal range of motion. Neck supple.  Cardiovascular: RRR Pulmonary/Chest:  Effort.  Abdominal: Soft. Bowel sounds are normal.  Musculoskeletal: Normal range of motion.  Neurological: He is alert and oriented to person, place, and time. A cranial nerve deficitis present.  LLE is 1-3/5. LUE is 1-3/5. Spasticity remains trace to 1/4 in LUE including wrist, HI, biceps, shoulder .Marland Kitchen Sensation 1/2 in both upper and lower. Pill rolling tremor right  is persistent.  He was able to stand up without assistance but has a head forward posture.  I did not have him walk today.  He has minimal rigidity in the right arm and leg.  Masked facies is noted.  Speech is monotone.  He displays reasonable insight and awareness. Psychiatric: He has a normal mood and affect. His behavior is normal. Judgment and thought content normal.  Skin: healed scar right nose, mandibular areas.     Assessment & Plan:  ASSESSMENT:  1. Right cerebrovascular accident, spastic left hemiparesis.  2. Seizure disorder.  3. Parkinson's disease 4. Bladder cancer   PLAN:  1. Continue sinemet for PD symptoms. May benefit from increase of sinemet 50/200 to QID instead of current schedule which is 50/200, 50/200,  25/100, 25/100 currently  -might benefit from parkinson's program at [facility].  They will call me after the holidays if they wish to pursue this. 2. Continue with Dilantin and keppra per neuro recs.  They asked me regarding the use of THC to treat seizures and Parkinson symptoms.  Given his complex medical history and the severity of his seizure disorder I advised highly against using an off-the-shelf substance such as this to treat seizures.  The likelihood that he would have an adverse effect with something like this is high given the other medications he is on as well. 3. Dantrium will be continued for spasticity management.   Liver function tests have been followed by primary team. 4. Bladder cancer mgt per urology. 5.12 months follow up. Fifteen minutes of face to face patient care time were spent during this visit. All questions were encouraged and answered.                   [

## 2017-08-20 ENCOUNTER — Telehealth: Payer: Self-pay | Admitting: Diagnostic Neuroimaging

## 2017-08-20 NOTE — Telephone Encounter (Signed)
Ok to try. -VRP

## 2017-08-20 NOTE — Telephone Encounter (Signed)
I spoke to [place], ex wife of pt.  (ok per DPR).  Pt recently saw Dr. Naaman Plummer and he has recommended going up on his sinemet (an additional 2 tabs).  Please advise.

## 2017-08-20 NOTE — Telephone Encounter (Signed)
[name] is calling and requesting to speak with the nurse regarding the patients medication carbadopa-levdopa. She has some questions about changing the dosage. Please call and advise.

## 2017-08-20 NOTE — Telephone Encounter (Signed)
I spoke to pt , as wife out of the office.  Relayed that he can take additional 2 tablet daily of the sinemet.  He verbalized understanding. Wife to call back if questions.

## 2017-09-03 ENCOUNTER — Other Ambulatory Visit: Payer: Self-pay | Admitting: Diagnostic Neuroimaging

## 2017-09-29 ENCOUNTER — Ambulatory Visit: Payer: Medicare Other | Admitting: Cardiovascular Disease

## 2017-09-29 ENCOUNTER — Encounter: Payer: Self-pay | Admitting: Cardiovascular Disease

## 2017-09-29 VITALS — BP 144/62 | HR 60 | Ht 67.0 in | Wt 149.0 lb

## 2017-09-29 DIAGNOSIS — I2510 Atherosclerotic heart disease of native coronary artery without angina pectoris: Secondary | ICD-10-CM | POA: Diagnosis not present

## 2017-09-29 DIAGNOSIS — I10 Essential (primary) hypertension: Secondary | ICD-10-CM | POA: Diagnosis not present

## 2017-09-29 DIAGNOSIS — E785 Hyperlipidemia, unspecified: Secondary | ICD-10-CM | POA: Diagnosis not present

## 2017-09-29 NOTE — Assessment & Plan Note (Signed)
History of bilateral carotid endarterectomies in the past status post staged bilateral carotid stenting by myself and Dr. Trula Slade in 2012. Unfortunately, he has not had carotid Dopplers last several years because of fiscal constraints.

## 2017-09-29 NOTE — Assessment & Plan Note (Signed)
History of hyperlipidemia on fenofibrate, Zetia and Crestor. His last lipid profile performed 10/22/16 revealed total cholesterol 264, from posterior level of 931. We will recheck a lipid and liver profile.

## 2017-09-29 NOTE — Patient Instructions (Signed)
Medication Instructions: Your physician recommends that you continue on your current medications as directed. Please refer to the Current Medication list given to you today.  Labwork: Your physician recommends that you return for a FASTING lipid profile and hepatic function panel at your earliest convenience.   Follow-Up: Your physician wants you to follow-up in: 1 year with Dr. Berry. You will receive a reminder letter in the mail two months in advance. If you don't receive a letter, please call our office to schedule the follow-up appointment.  If you need a refill on your cardiac medications before your next appointment, please call your pharmacy.  

## 2017-09-29 NOTE — Assessment & Plan Note (Signed)
History of essential hypertension blood pressure measures 144/62. He is on metoprolol and Benicar. Continue current meds at current dosing

## 2017-09-29 NOTE — Progress Notes (Signed)
09/29/2017 [name]   [birth year]  [id number]  Primary Physician Luis Jordan, MD Primary Cardiologist: Luis Harp MD [name], [place]  HPI:  [name] is a 71 y.o.  mildly overweight but was Caucasian male father of 4 children accompanied by his first ex-wife who is his caregiver. I last saw him in the office  09/26/16. His primary care physician is Dr. Jonathon [name]. He he is a retired Administrator. His cardiac risk factor profile was positive for 75 pack years of tobacco smoking having quit in 2009. He is treated hyperlipidemia. He did have bilateral carotid endarterectomies back in 1999 a sister with a stroke that caused left hemiparesis and paresthesias. He had bilateral carotid stenting performed by Dr. Trula [name] and myself in a staged fashion because of restenosis back in 2012. He also clearly has Parkinson's disease which has been progressive over the last year now requiring him to ambulate with the aid of a walker.. He had coronary bypass grafting in 2004 by Dr. Merilynn [name] and a Myoview stress test performed 01/09/11 was nonischemic. He denies chest pain or shortness of breath. He has been diagnosed with bladder cancer and is currently being treated by Dr. Denton [name]. He has had 4 bladder surgeries and treatment with BCG.. Addition, he was recently diagnosed with Parkinson's disease and is wheelchair-bound. Since I saw me her ago he has had a stroke when he was off Aggrenox which she has somewhat recovered from. He denies chest pain or shortness of breath.     Current Meds  Medication Sig  . Ascorbic Acid (VITAMIN C) 1000 MG tablet Take 1,000 mg by mouth daily.  . carbidopa-levodopa (SINEMET IR) 25-100 MG tablet Take 2 tablets by mouth 3 (three) times daily.  . cholecalciferol (VITAMIN D) 1000 units tablet Take 1,000 Units by mouth daily with breakfast.  . Coenzyme Q10 (COQ-10) 50 MG CAPS Take 50 mg by mouth 2 (two) times daily.   .  dantrolene (DANTRIUM) 50 MG capsule TAKE 1 CAPSULE BY MOUTH TWICE DAILY  . DILANTIN 100 MG ER capsule Take 1 capsule (100 mg total) by mouth 3 (three) times daily.  Marland Kitchen dipyridamole-aspirin (AGGRENOX) 200-25 MG 12hr capsule Take 1 capsule by mouth 2 (two) times daily.  Marland Kitchen ezetimibe (ZETIA) 10 MG tablet TAKE ONE TABLET (10MG ) BY MOUTH DAILY  . fenofibrate (TRICOR) 48 MG tablet TAKE 1 TABLET BY MOUTH DAILY  . Flaxseed, Linseed, 1000 MG CAPS Take 1,000 mg by mouth every evening.   Marland Kitchen FOLBIC 2.5-25-2 MG TABS tablet TAKE 1 TABLET DAILY WITH FOOD  . insulin lispro (HUMALOG KWIKPEN) 100 UNIT/ML KiwkPen Inject 4 Units into the skin See admin instructions. Takes 3 times daily before meals for sugar over 200 only  . KEPPRA 500 MG tablet Take 1 tablet (500 mg total) by mouth 2 (two) times daily.  . metoprolol tartrate (LOPRESSOR) 25 MG tablet TAKE ONE TABLET BY MOUTH DAILY (KEEP OFFICE VISIT)  . nitroGLYCERIN (NITROSTAT) 0.4 MG SL tablet Place 1 tablet (0.4 mg total) under the tongue every 5 (five) minutes as needed for chest pain.  Marland Kitchen olmesartan (BENICAR) 20 MG tablet Take 1 tablet (20 mg total) by mouth daily.  Glory Rosebush DELICA LANCETS 27O MISC   . ONETOUCH VERIO test strip   . RAPAFLO 8 MG CAPS capsule Take 8 mg by mouth daily with breakfast.   . rosuvastatin (CRESTOR) 20 MG tablet      Allergies  Allergen Reactions  .  Altace [Ramipril] Cough    Social History   Socioeconomic History  . Marital status: Divorced    Spouse name: Not on file  . Number of children: 4  . Years of education: HS  . Highest education level: Not on file  Social Needs  . Financial resource strain: Not on file  . Food insecurity - worry: Not on file  . Food insecurity - inability: Not on file  . Transportation needs - medical: Not on file  . Transportation needs - non-medical: Not on file  Occupational History    Employer: RETIRED  Tobacco Use  . Smoking status: Former Smoker    Years: 5.00    Types: Cigarettes     Last attempt to quit: 11/14/2007    Years since quitting: 9.8  . Smokeless tobacco: Never Used  Substance and Sexual Activity  . Alcohol use: No  . Drug use: No  . Sexual activity: Not on file  Other Topics Concern  . Not on file  Social History Narrative   Patient lives at home with his caregiver/ex-wife.   Caffeine- 2 cups of coffee daily     Review of Systems: General: negative for chills, fever, night sweats or weight changes.  Cardiovascular: negative for chest pain, dyspnea on exertion, edema, orthopnea, palpitations, paroxysmal nocturnal dyspnea or shortness of breath Dermatological: negative for rash Respiratory: negative for cough or wheezing Urologic: negative for hematuria Abdominal: negative for nausea, vomiting, diarrhea, bright red blood per rectum, melena, or hematemesis Neurologic: negative for visual changes, syncope, or dizziness All other systems reviewed and are otherwise negative except as noted above.    Blood pressure (!) 144/62, pulse 60, height 5\' 7"  (1.702 m), weight 149 lb (67.6 kg).  General appearance: alert and no distress Neck: no adenopathy, no carotid bruit, no JVD, supple, symmetrical, trachea midline and thyroid not enlarged, symmetric, no tenderness/mass/nodules Lungs: clear to auscultation bilaterally Heart: regular rate and rhythm, S1, S2 normal, no murmur, click, rub or gallop Extremities: extremities normal, atraumatic, no cyanosis or edema Pulses: 2+ and symmetric Skin: Skin color, texture, turgor normal. No rashes or lesions Neurologic: Alert and oriented X 3, normal strength and tone. Normal symmetric reflexes. Normal coordination and gait  EKG sinus bradycardia 58 without ST or T-wave changes. I personally reviewed this EKG.  ASSESSMENT AND PLAN:   Occlusion and stenosis of carotid artery without mention of cerebral infarction History of bilateral carotid endarterectomies in the past status post staged bilateral carotid stenting by  myself and Dr. Trula [name] in 2012. Unfortunately, he has not had carotid Dopplers last several years because of fiscal constraints.  Coronary artery disease History of CAD status post coronary artery bypass grafting by Dr. Roxan Hockey in 2004. His last Myoview performed 01/09/11 was nonischemic. He denies chest pain or shortness of breath.  Hyperlipidemia History of hyperlipidemia on fenofibrate, Zetia and Crestor. His last lipid profile performed 10/22/16 revealed total cholesterol 264, from posterior level of 931. We will recheck a lipid and liver profile.  Essential hypertension History of essential hypertension blood pressure measures 144/62. He is on metoprolol and Benicar. Continue current meds at current dosing      Luis Harp MD [facility], [facility] 09/29/2017 11:57 AM

## 2017-09-29 NOTE — Assessment & Plan Note (Signed)
History of CAD status post coronary artery bypass grafting by Dr. Roxan Hockey in 2004. His last Myoview performed 01/09/11 was nonischemic. He denies chest pain or shortness of breath.

## 2017-10-07 ENCOUNTER — Other Ambulatory Visit: Payer: Self-pay | Admitting: Physical Medicine & Rehabilitation

## 2017-10-07 ENCOUNTER — Other Ambulatory Visit: Payer: Self-pay | Admitting: Cardiovascular Disease

## 2017-10-09 LAB — LIPID PANEL
Chol/HDL Ratio: 3.4 ratio (ref 0.0–5.0)
Cholesterol, Total: 137 mg/dL (ref 100–199)
HDL: 40 mg/dL (ref >39)
LDL Calculated: 67 mg/dL (ref 0–99)
Triglycerides: 149 mg/dL (ref 0–149)
VLDL Cholesterol Cal: 30 mg/dL (ref 5–40)

## 2017-10-09 LAB — HEPATIC FUNCTION PANEL
ALT: 8 IU/L (ref 0–44)
AST: 20 IU/L (ref 0–40)
Albumin: 4.4 g/dL (ref 3.5–4.8)
Alkaline Phosphatase: 54 IU/L (ref 39–117)
Bilirubin Total: <0.2 mg/dL (ref 0.0–1.2)
Bilirubin, Direct: 0.08 mg/dL (ref 0.00–0.40)
Total Protein: 6.9 g/dL (ref 6.0–8.5)

## 2017-10-16 ENCOUNTER — Other Ambulatory Visit: Payer: Self-pay

## 2017-10-16 MED ORDER — DANTROLENE SODIUM 50 MG PO CAPS: 50.0000 mg | ORAL_CAPSULE | Freq: Two times a day (BID) | ORAL | 3 refills | Status: DC

## 2017-10-16 NOTE — Telephone Encounter (Signed)
Refilled and notfied.

## 2017-10-16 NOTE — Telephone Encounter (Signed)
Aldean Jewett called stating that the patients pharmacy has faxed several times to get prescription filled for dantrium 50 mg. She stated that she has put the last 3 pills in patients pill box this morning (10/16/17).

## 2017-10-19 ENCOUNTER — Telehealth: Payer: Self-pay | Admitting: Cardiovascular Disease

## 2017-10-19 ENCOUNTER — Encounter: Payer: Self-pay | Admitting: Cardiovascular Disease

## 2017-10-19 MED ORDER — NITROGLYCERIN 0.4 MG SL SUBL: 0.4000 mg | SUBLINGUAL_TABLET | SUBLINGUAL | 3 refills | Status: DC | PRN

## 2017-10-19 NOTE — Telephone Encounter (Signed)
[name] is calling to get the results of [name] Lab work . Please call   Thanks

## 2017-10-19 NOTE — Telephone Encounter (Signed)
Spoke [name] - made her aware of lab results. She states they asked if patient could come off (or reduce dose) of crestor. She states patient is still losing weight - is down to 143lbs.   Mailed copy of lab results per her request  Routed to MD/CMA

## 2017-10-19 NOTE — Telephone Encounter (Signed)
Notes recorded by Lorretta Harp, MD on 10/14/2017 at 3:22 PM EST Excellent FLP and LFTs!!!! Triglyceride level has markedly improved as well. ------

## 2017-10-20 NOTE — Telephone Encounter (Signed)
This fasting lipid profile is exactly where it needs to be. I'm not sure his weight loss is related to statin therapy.Marland Kitchen

## 2017-11-06 NOTE — Telephone Encounter (Signed)
Results given to pt caregiver--ok per DPR. Pt caregiver verbalized understanding and thanks for the call.

## 2017-11-11 ENCOUNTER — Other Ambulatory Visit: Payer: Self-pay | Admitting: Cardiovascular Disease

## 2017-12-01 ENCOUNTER — Other Ambulatory Visit: Payer: Self-pay | Admitting: Cardiovascular Disease

## 2017-12-01 NOTE — Telephone Encounter (Signed)
REFILL 

## 2018-01-05 ENCOUNTER — Other Ambulatory Visit: Payer: Self-pay | Admitting: Pharmacist Clinician (PhC)/ Clinical Pharmacy Specialist

## 2018-01-05 NOTE — Telephone Encounter (Signed)
Spoke with caregiver about possible switch from olmesartan.  Patient is intolerant to ACEI due to cough.  Per caregiver patient has almost 3 month supply at home at the moment.  She will contact pharmacy when down to about 3 weeks and we can have a conversation at that time.  She previously worked in [employer], sports and is familiar with this process

## 2018-02-10 ENCOUNTER — Telehealth: Payer: Self-pay | Admitting: *Deleted

## 2018-02-10 NOTE — Telephone Encounter (Signed)
I spoke with Dr. Naaman Plummer about this request.  Dr. Naaman Plummer called the patient and spoke with him directly. He determined that an office visit would be best for an evaluation.  Patient agreed. Appointment set. Dr. Cheron Schaumann office called and notified that patient will be evaluated

## 2018-02-10 NOTE — Telephone Encounter (Signed)
Blanch Media, called from Dr. Alessandra Grout office at [facility] [place] asking if patient would benefit from a shoulder brace for his left shoulder subluxation.  If so, they ask would Dr. Naaman Plummer right the script for it or should they?Marland Kitchen..Marland Kitchenplease advise

## 2018-02-12 ENCOUNTER — Encounter: Payer: Self-pay | Admitting: Hematology

## 2018-02-12 ENCOUNTER — Telehealth: Payer: Self-pay | Admitting: Hematology

## 2018-02-12 NOTE — Telephone Encounter (Signed)
Appt has been scheduled for the pt to see Dr. Irene Limbo on 5/23 at 1pm. Unable to reach the pt, vm didn't pick up. Letter mailed to the pt. Faxed a letter to the referring to notify the pt.

## 2018-02-16 ENCOUNTER — Telehealth: Payer: Self-pay | Admitting: *Deleted

## 2018-02-16 NOTE — Telephone Encounter (Signed)
Received call from pt's wife stating patient is scheduled as new pt on 5/23 for macrocytosis.  Pt has hx of bladder cancer with last BCG treatment in June 2018.  Pt scheduled for 3 upcoming BCG treatments 5/14, 5/21, and 5/28.  Should patient reschedule apt due to these treatments/will the treatments effect labs to be ordered by Dr. Irene Limbo?  Message sent to Dr. Irene Limbo.  Will follow up with pt/wife when confirmed.  [phone]

## 2018-02-19 ENCOUNTER — Telehealth: Payer: Self-pay | Admitting: *Deleted

## 2018-02-19 NOTE — Telephone Encounter (Signed)
Per Dr. Irene Limbo, ok to keep 5/23 appointments despite upcoming BCG treatments.  Informed pt's wife.

## 2018-03-03 NOTE — Progress Notes (Signed)
HEMATOLOGY/ONCOLOGY CONSULTATION NOTE  Date of Service: 03/04/18  Patient Care Team: [name], MD as PCP - General (Family Medicine) Penni Bombard, MD as Consulting Physician (Neurology) Lorretta Harp, MD as Consulting Physician (Cardiology)  CHIEF COMPLAINTS/PURPOSE OF CONSULTATION:  Macrocytosis without Anemia  HISTORY OF PRESENTING ILLNESS:   [name] is a wonderful 72 y.o. male who has been referred to [place] by Dr [name] for evaluation and management of Macrocytosis without Anemia. He is accompanied today by his caregiver. The pt reports that he is doing well overall.   The pt reports that he has been on intravesical BCG treatments with Dr Merlyn Albert in Urology.  The pt has taken seizure medication for 10 years including Keppra and Dilantin. He takes Folic Acid, and three B Vitamin supplements daily. He denies a history of thyroid problems. He notes that he bruises easily and frequently bruises his left arm, which has lost motor control, as it tends to hang and hit objects. He also takes a nerve blocker. He takes Sinemet.   He notes that he was 189 pounds a year and a half ago, and is today 144 pounds. He notes that his diabetes has been well controlled. He notes that his glucose ranges between 80-120. He also notes fatigue that hasn't changed in the last 6 months. He notes that leg weakness informs his fatigue most. He denies any swallowing problems. He hasn't had PT.   He notes that he has had bladder cancer, with tumor resection and BCG treatment four times with no tumors deeper than the lining. He didn't have any constitutional symptoms with BCG treatment.   Most recent lab results (02/11/18) of CBC  is as follows: all values are WNL except for RBC at 3.75, HGB at 12.4, HCT at 38.2, MCV at 101.9, MCH at 33.2.  On review of systems, pt reports fatigue, weight loss, bruising, and denies abdominal pains, leg swelling and any other symptoms.   On PMHx the  pt reports CAD s/p CABG, seizure, CVA, TIAs, HTN, DM type 2, Parkinson's disease. On Social Hx the pt denies consuming ETOH and has not smoked a cigarette in 10 years.   MEDICAL HISTORY:  Past Medical History:  Diagnosis Date  . At high risk for falls   . Bladder cancer [facility])    urologist-  dr Gaynelle Arabian  . CAD (coronary artery disease) cardiologist-  dr berry  . Cerebrovascular arteriosclerosis   . Gait disturbance, post-stroke    uses cane, rollator, and w/c long distance  . Hemiparkinsonism [facility]) neurologist-- dr Leta Baptist   right side body  . History of basal cell carcinoma excision    Sept 2016--  MOH's surgery side of nose  . History of carotid artery stenosis cardiologist-- dr berry   bilateral --  1999 s/p  bilateral ICA endarterectomy and recurrent restenosis 2012  s/p  staged bilateral stenting   per last duplex 2015  stents widely patent  . History of CVA with residual deficit neurologist-  dr Leta Baptist   3/ 1999  Right MCA  and  12/ 2004  Anterior division of  Right MCA ---  residual left spactic hemiparesis and left foot drop  . History of MI (myocardial infarction)    02/ 2004   s/p  cabg   . HOH (hard of hearing)    LEFT EAR  POST cva  . Hyperextension deformity of left knee    wears brace  . Hyperlipidemia   . Hypertension   .  Left foot drop    residual from CVA  -- wears brace  . Left spastic hemiparesis ([place])    residual CVA 1999  . Pancreatic pseudocyst   . Peripheral arterial disease ([place])   . S/P CABG x 4    02/ 2004  . Seizure disorder [facility]) last seizure 2011 due to confusion   neurologist-  dr Leta Baptist-- per note seizure documented 2008 breakthrough partial complex seizure (prior tonic-clonic seizure's post CVA)  . Type 2 diabetes, diet controlled ([place])     2  . Visual neglect    LEFT EYE    SURGICAL HISTORY: Past Surgical History:  Procedure Laterality Date  . CARDIAC CATHETERIZATION  11/17/2002   significant left main disease and 3 vessel CAD,  mildly depressed LV systolic  fx, 67% left renal artery stenosis  . CAROTID ENDARTERECTOMY  12/1997   [facility])   Bilateral ICA  . CAROTID STENT INSERTION Bilateral dr berry and dr Kristine Royal--  (In-stent Restenosis)  LeftICA   02-12-2011;  [place]   02-26-2011  . CORONARY ARTERY BYPASS GRAFT  2/ 2004   dr hendrickson   x3 LIMA to LAD,free right internal mammary artery to obtuse marginal 1,SVG to distal right coronary.  . CYSTOSCOPY W/ RETROGRADES Bilateral 09/10/2015   Procedure: CYSTOSCOPY WITH RETROGRADE PYELOGRAM;  Surgeon: Carolan Clines, MD;  Location: [facility];  Service: Urology;  Laterality: Bilateral;  . CYSTOSCOPY WITH BIOPSY N/A 09/10/2015   Procedure: CYSTOSCOPY WITH BIOPSY,RIGHT TRIGONAL TUMOR 1.5 CM, EXCISIONAL BIOPSY WITH TAUBER FORCEP RIGHT POSTERIOR BLADDER WALL 1 CM, EXCISIONAL BIOPSY SATELITE BLADDER WALL 1 CM;  Surgeon: Carolan Clines, MD;  Location: [facility];  Service: Urology;  Laterality: N/A;  . CYSTOSCOPY WITH BIOPSY N/A 03/26/2017   Procedure: CYSTOSCOPY WITH BIOPSY/;  Surgeon: Carolan Clines, MD;  Location: [facility];  Service: Urology;  Laterality: N/A;  . CYSTOSCOPY WITH HYDRODISTENSION AND BIOPSY N/A 10/29/2015   Procedure: REPEAT CYSTOSCOPY BIOPSY BLADDER DEEP MUSCLES BLADDER BIOPSY;  Surgeon: Carolan Clines, MD;  Location: [place];  Service: Urology;  Laterality: N/A;  . CYSTOSCOPY WITH URETHRAL DILATATION N/A 09/10/2015   Procedure: CYSTOSCOPY WITH URETHRAL DILATATION;  Surgeon: Carolan Clines, MD;  Location: [place];  Service: Urology;  Laterality: N/A;  . FULGURATION OF BLADDER TUMOR N/A 03/26/2017   Procedure: FULGURATION OF BLADDER TUMOR;  Surgeon: Carolan Clines, MD;  Location: [facility];  Service: Urology;  Laterality: N/A;  . [place]  . MOHS SURGERY  06-20-2015   side of nose  . NM MYOCAR PERF WALL MOTION  01/09/2011   dr berry    mild to mod. perfusion defect in the basal inferolateral & mid inferolaterl regions consistant with an infarct/scar, no signigicant ischemia demonstracted/  Low Risk scan, no sig. change from previous study/  normal LV function and wall motion , ef 63%  . ORIF RIGHT ANKLE FX  1998   hardware retained  . TRANSURETHRAL RESECTION OF BLADDER TUMOR N/A 09/10/2015   Procedure: CYSTO TRANSURETHRAL RESECTION OF BLADDER TUMOR (TURBT) OF LEFT POSTERIOR BLADDER TUMOR 2 CM;  Surgeon: Carolan Clines, MD;  Location: [facility];  Service: Urology;  Laterality: N/A;  . TRANSURETHRAL RESECTION OF BLADDER TUMOR Left 04/17/2016   Procedure: TRANSURETHRAL RESECTION OF BLADDER TUMOR (TURBT);  Surgeon: Carolan Clines, MD;  Location: [facility];  Service: Urology;  Laterality: Left;  . US ECHOCARDIOGRAPHY  01/03/2011   normal LVF, ef>55%/  mild LAE/ trace MR  and TR,  mild AV sclerosis without stenosis    SOCIAL HISTORY: Social History   Socioeconomic History  . Marital status: Divorced    Spouse name: Not on file  . Number of children: 4  . Years of education: HS  . Highest education level: Not on file  Occupational History    Employer: RETIRED  Social Needs  . Financial resource strain: Not on file  . Food insecurity:    Worry: Not on file    Inability: Not on file  . Transportation needs:    Medical: Not on file    Non-medical: Not on file  Tobacco Use  . Smoking status: Former Smoker    Years: 5.00    Types: Cigarettes    Last attempt to quit: 11/14/2007    Years since quitting: 10.3  . Smokeless tobacco: Never Used  Substance and Sexual Activity  . Alcohol use: No  . Drug use: No  . Sexual activity: Not on file  Lifestyle  . Physical activity:    Days per week: Not on file    Minutes per session: Not on file  . Stress: Not on file  Relationships  . Social connections:    Talks on phone: Not on file    Gets together: Not on file    Attends religious service: Not on file      Active member of club or organization: Not on file    Attends meetings of clubs or organizations: Not on file    Relationship status: Not on file  . Intimate partner violence:    Fear of current or ex partner: Not on file    Emotionally abused: Not on file    Physically abused: Not on file    Forced sexual activity: Not on file  Other Topics Concern  . Not on file  Social History Narrative   Patient lives at home with his caregiver/ex-wife.   Caffeine- 2 cups of coffee daily    FAMILY HISTORY: Family History  Problem Relation Age of Onset  . Stroke Father   . Heart attack Father   . Stroke Mother   . Diabetes Brother   . Hyperlipidemia Brother   . Hypertension Brother   . Hyperlipidemia Sister   . Hypertension Sister     ALLERGIES:  is allergic to altace [ramipril].  MEDICATIONS:  Current Outpatient Medications  Medication Sig Dispense Refill  . Ascorbic Acid (VITAMIN C) 1000 MG tablet Take 1,000 mg by mouth daily.    . BCG vaccine 81 mg in sodium chloride 0.9 % 50 mL Instill 81 mg into the bladder once a week.    . carbidopa-levodopa (SINEMET IR) 25-100 MG tablet Take 2 tablets by mouth 3 (three) times daily. 540 tablet 4  . cholecalciferol (VITAMIN D) 1000 units tablet Take 2,000 Units by mouth daily with breakfast.     . Coenzyme Q10 (COQ-10) 50 MG CAPS Take by mouth 2 (two) times daily.     . dantrolene (DANTRIUM) 50 MG capsule Take 1 capsule (50 mg total) by mouth 2 (two) times daily. 180 capsule 3  . DILANTIN 100 MG ER capsule Take 1 capsule (100 mg total) by mouth 3 (three) times daily. 270 capsule 4  . dipyridamole-aspirin (AGGRENOX) 200-25 MG 12hr capsule Take 1 capsule by mouth 2 (two) times daily. 180 capsule 4  . ezetimibe (ZETIA) 10 MG tablet TAKE ONE TABLET (10MG ) BY MOUTH DAILY 90 tablet 3  . fenofibrate (TRICOR) 48 MG tablet TAKE 1  TABLET BY MOUTH DAILY 90 tablet 3  . Flaxseed, Linseed, 1000 MG CAPS Take 1,000 mg by mouth every evening.     Marland Kitchen FOLBIC  2.5-25-2 MG TABS tablet TAKE 1 TABLET DAILY WITH FOOD 90 tablet 2  . insulin lispro (HUMALOG KWIKPEN) 100 UNIT/ML KiwkPen Inject 4 Units into the skin See admin instructions. Takes 3 times daily before meals for sugar over 200 only    . KEPPRA 500 MG tablet Take 1 tablet (500 mg total) by mouth 2 (two) times daily. 180 tablet 4  . metoprolol tartrate (LOPRESSOR) 25 MG tablet TAKE ONE TABLET BY MOUTH DAILY (KEEP OFFICE VISIT) 90 tablet 3  . nitroGLYCERIN (NITROSTAT) 0.4 MG SL tablet Place 1 tablet (0.4 mg total) under the tongue every 5 (five) minutes as needed for chest pain. 25 tablet 3  . olmesartan (BENICAR) 20 MG tablet Take 1 tablet (20 mg total) by mouth daily. 90 tablet 1  . ONETOUCH DELICA LANCETS 95A MISC     . ONETOUCH VERIO test strip     . RAPAFLO 8 MG CAPS capsule Take 8 mg by mouth daily with breakfast.     . rosuvastatin (CRESTOR) 20 MG tablet Take 20 mg by mouth daily.     No current facility-administered medications for this visit.     REVIEW OF SYSTEMS:    10 Point review of Systems was done is negative except as noted above.  PHYSICAL EXAMINATION:  . Vitals:   03/04/18 1303  BP: (!) 169/70  Pulse: 60  Resp: 18  Temp: 98 F (36.7 C)  SpO2: 100%   Filed Weights   03/04/18 1303  Weight: 144 lb 14.4 oz (65.7 kg)   .Body mass index is 22.69 kg/m.  GENERAL:alert, in no acute distress and comfortable SKIN: no acute rashes, no significant lesions EYES: conjunctiva are pink and non-injected, sclera anicteric OROPHARYNX: MMM, no exudates, no oropharyngeal erythema or ulceration NECK: supple, no JVD LYMPH:  no palpable lymphadenopathy in the cervical, axillary or inguinal regions LUNGS: clear to auscultation b/l with normal respiratory effort HEART: regular rate & rhythm ABDOMEN:  normoactive bowel sounds , non tender, not distended. Extremity: no pedal edema PSYCH: alert & oriented x 3 with fluent speech NEURO: no focal motor/sensory deficits  LABORATORY  DATA:  I have reviewed the data as listed  . CBC Latest Ref Rng & Units 03/04/2018 03/04/2018 03/23/2017  WBC 4.0 - 10.3 K/uL 6.8 - 8.1  Hemoglobin 13.0 - 17.1 g/dL 13.0 - 13.4  Hematocrit 37.5 - 51.0 % 38.2 39.4 39.7  Platelets 140 - 400 K/uL 179 - 198   . CBC    Component Value Date/Time   WBC 6.8 03/04/2018 1434   RBC 3.88 (L) 03/04/2018 1434   HGB 13.0 03/04/2018 1434   HCT 38.2 03/04/2018 1435   PLT 179 03/04/2018 1434   MCV 101.5 (H) 03/04/2018 1434   MCH 33.5 (H) 03/04/2018 1434   MCHC 33.0 03/04/2018 1434   RDW 13.4 03/04/2018 1434   LYMPHSABS 1.8 03/04/2018 1434   MONOABS 0.9 03/04/2018 1434   EOSABS 0.3 03/04/2018 1434   BASOSABS 0.0 03/04/2018 1434    . Lab Results  Component Value Date   RBC 3.88 (L) 03/04/2018     . CMP Latest Ref Rng & Units 03/04/2018 10/08/2017 03/23/2017  Glucose 70 - 140 mg/dL 105 - 121(H)  BUN 7 - 26 mg/dL 36(H) - 27(H)  Creatinine 0.70 - 1.30 mg/dL 1.27 - 1.13  Sodium 136 - 145  mmol/L 139 - 140  Potassium 3.5 - 5.1 mmol/L 5.0 - 5.0  Chloride 98 - 109 mmol/L 106 - 107  CO2 22 - 29 mmol/L 28 - 26  Calcium 8.4 - 10.4 mg/dL 9.8 - 9.6  Total Protein 6.4 - 8.3 g/dL 7.9 6.9 7.4  Total Bilirubin 0.2 - 1.2 mg/dL <0.2(L) <0.2 0.4  Alkaline Phos 40 - 150 U/L 60 54 57  AST 5 - 34 U/L 22 20 22   ALT 0 - 55 U/L 6 8 5(L)   Component     Latest Ref Rng & Units 03/04/2018  IgG (Immunoglobin G), Serum     700 - 1,600 mg/dL 776  IgA     61 - 437 mg/dL 301  IgM (Immunoglobulin M), Srm     15 - 143 mg/dL 102  Total Protein ELP     6.0 - 8.5 g/dL 7.0  Albumin SerPl Elph-Mcnc     2.9 - 4.4 g/dL 3.9  Alpha 1     0.0 - 0.4 g/dL 0.2  Alpha2 Glob SerPl Elph-Mcnc     0.4 - 1.0 g/dL 1.0  B-Globulin SerPl Elph-Mcnc     0.7 - 1.3 g/dL 1.2  Gamma Glob SerPl Elph-Mcnc     0.4 - 1.8 g/dL 0.7  M Protein SerPl Elph-Mcnc     Not Observed g/dL Not Observed  Globulin, Total     2.2 - 3.9 g/dL 3.1  Albumin/Glob SerPl     0.7 - 1.7 1.3  IFE 1       Comment  Please Note (HCV):      Comment  TSH     0.450 - 4.500 uIU/mL 3.060  Thyroxine (T4)     4.5 - 12.0 ug/dL 5.7  T3 Uptake Ratio     24 - 39 % 24  Free Thyroxine Index     1.2 - 4.9 1.4  Folate, Hemolysate     Not Estab. ng/mL >620.0  HCT     37.5 - 51.0 % 38.2  Folate, RBC     >498 ng/mL >1,623  Kappa free light chain     3.3 - 19.4 mg/L 37.0 (H)  Lamda free light chains     5.7 - 26.3 mg/L 24.3  Kappa, lamda light chain ratio     0.26 - 1.65 1.52  Vitamin B12     180 - 914 pg/mL 2,517 (H)  LDH     125 - 245 U/L 132    RADIOGRAPHIC STUDIES: I have personally reviewed the radiological images as listed and agreed with the findings in the report. No results found.  ASSESSMENT & PLAN:  72 y.o. male with  1. RBC Macrocytosis without Anemia PLAN -Discussed patient's most recent labs from 02/11/18, Hgb at 12.4, MCV at 101.9. WBC and Platelets are WNL which is reassuring.  -Pt takes Keppra and Dilantin which could be causing macrocytosis. -denies significant ETOH use -checked  thyroid levels- WNL - B12 levels and RBC folate levels wnl -LDH WNL - no overt evidence of hemolysis at this time -SPEP and SFLC - no evidence of monoclonal paraproteinemia. -Begin taking OTC Vitamin B complex -BM Bx not recommended at this point . - no overt evidence of MDS -If macrocytosis is a function of medications, this would certainly not be prohibitive from continuing these imp anti SZ medications -If further changes in CBC occur, a further work up will be considered  2. . Patient Active Problem List   Diagnosis Date Noted  .  Essential hypertension 09/25/2015  . Parkinsonism ([place]) 04/19/2015  . Essential tremor 04/19/2015  . Parkinson's disease ([place]) 06/28/2014  . Coronary artery disease 03/24/2014  . Hyperlipidemia 03/24/2014  . Aftercare following surgery of the circulatory system, [place] 11/14/2013  . Chronic ischemic right MCA stroke 03/08/2013  . Seizure disorder ([place])  03/08/2013  . Cerebral thrombosis with cerebral infarction ([place]) 03/29/2012  . Spastic hemiplegia affecting nondominant side ([place]) 03/29/2012  . Seizure ([place]) 03/29/2012  . Occlusion and stenosis of carotid artery without mention of cerebral infarction 10/20/2011   -conitnue f/u with PCP for mx of chronic medical issues  Labs today RTC with Dr Irene Limbo in 4 months with labs   All of the patients questions were answered with apparent satisfaction. The patient knows to call the clinic with any problems, questions or concerns.  The toal time spent in the appt was 45 minutes and more than 50% was on counseling and direct patient cares.    Sullivan Lone MD MS AAHIVMS [facility] Hematology/Oncology Physician [facility]  (Office):       [phone] (Work cell):  [phone] (Fax):           [phone]  03/04/2018 1:59 PM  This document serves as a record of services personally performed by Sullivan Lone, MD. It was created on his behalf by Baldwin Jamaica, a trained medical scribe. The creation of this record is based on the scribe's personal observations and the provider's statements to them.   .I have reviewed the above documentation for accuracy and completeness, and I agree with the above. Brunetta Genera MD MS

## 2018-03-04 ENCOUNTER — Inpatient Hospital Stay: Payer: Medicare Other

## 2018-03-04 ENCOUNTER — Encounter: Payer: Self-pay | Admitting: Hematology

## 2018-03-04 ENCOUNTER — Telehealth: Payer: Self-pay

## 2018-03-04 ENCOUNTER — Inpatient Hospital Stay: Payer: Medicare Other | Attending: Hematology | Admitting: Hematology

## 2018-03-04 VITALS — BP 169/70 | HR 60 | Temp 98.0°F | Resp 18 | Ht 67.0 in | Wt 144.9 lb

## 2018-03-04 DIAGNOSIS — D7589 Other specified diseases of blood and blood-forming organs: Secondary | ICD-10-CM | POA: Insufficient documentation

## 2018-03-04 DIAGNOSIS — E785 Hyperlipidemia, unspecified: Secondary | ICD-10-CM | POA: Insufficient documentation

## 2018-03-04 DIAGNOSIS — Z794 Long term (current) use of insulin: Secondary | ICD-10-CM | POA: Diagnosis not present

## 2018-03-04 DIAGNOSIS — I10 Essential (primary) hypertension: Secondary | ICD-10-CM | POA: Insufficient documentation

## 2018-03-04 DIAGNOSIS — Z79899 Other long term (current) drug therapy: Secondary | ICD-10-CM | POA: Insufficient documentation

## 2018-03-04 DIAGNOSIS — Z7982 Long term (current) use of aspirin: Secondary | ICD-10-CM | POA: Diagnosis not present

## 2018-03-04 DIAGNOSIS — Z87891 Personal history of nicotine dependence: Secondary | ICD-10-CM | POA: Diagnosis not present

## 2018-03-04 DIAGNOSIS — G20 Parkinson's disease: Secondary | ICD-10-CM | POA: Diagnosis not present

## 2018-03-04 DIAGNOSIS — G40909 Epilepsy, unspecified, not intractable, without status epilepticus: Secondary | ICD-10-CM

## 2018-03-04 DIAGNOSIS — I2510 Atherosclerotic heart disease of native coronary artery without angina pectoris: Secondary | ICD-10-CM | POA: Insufficient documentation

## 2018-03-04 DIAGNOSIS — Z8673 Personal history of transient ischemic attack (TIA), and cerebral infarction without residual deficits: Secondary | ICD-10-CM | POA: Diagnosis not present

## 2018-03-04 DIAGNOSIS — Z8551 Personal history of malignant neoplasm of bladder: Secondary | ICD-10-CM | POA: Diagnosis not present

## 2018-03-04 DIAGNOSIS — E118 Type 2 diabetes mellitus with unspecified complications: Secondary | ICD-10-CM | POA: Diagnosis not present

## 2018-03-04 LAB — CBC WITH DIFFERENTIAL/PLATELET
Basophils Absolute: 0 10*3/uL (ref 0.0–0.1)
Basophils Relative: 1 %
Eosinophils Absolute: 0.3 10*3/uL (ref 0.0–0.5)
Eosinophils Relative: 5 %
HCT: 39.4 % (ref 38.4–49.9)
Hemoglobin: 13 g/dL (ref 13.0–17.1)
Lymphocytes Relative: 26 %
Lymphs Abs: 1.8 10*3/uL (ref 0.9–3.3)
MCH: 33.5 pg — ABNORMAL HIGH (ref 27.2–33.4)
MCHC: 33 g/dL (ref 32.0–36.0)
MCV: 101.5 fL — ABNORMAL HIGH (ref 79.3–98.0)
Monocytes Absolute: 0.9 10*3/uL (ref 0.1–0.9)
Monocytes Relative: 13 %
Neutro Abs: 3.8 10*3/uL (ref 1.5–6.5)
Neutrophils Relative %: 55 %
Platelets: 179 10*3/uL (ref 140–400)
RBC: 3.88 MIL/uL — ABNORMAL LOW (ref 4.20–5.82)
RDW: 13.4 % (ref 11.0–14.6)
WBC: 6.8 10*3/uL (ref 4.0–10.3)

## 2018-03-04 LAB — CMP (CANCER CENTER ONLY)
ALT: 6 U/L (ref 0–55)
AST: 22 U/L (ref 5–34)
Albumin: 4.4 g/dL (ref 3.5–5.0)
Alkaline Phosphatase: 60 U/L (ref 40–150)
Anion gap: 5 (ref 3–11)
BUN: 36 mg/dL — ABNORMAL HIGH (ref 7–26)
CO2: 28 mmol/L (ref 22–29)
Calcium: 9.8 mg/dL (ref 8.4–10.4)
Chloride: 106 mmol/L (ref 98–109)
Creatinine: 1.27 mg/dL (ref 0.70–1.30)
GFR, Est AFR Am: >60 mL/min (ref >60)
GFR, Estimated: 54 mL/min — ABNORMAL LOW (ref >60)
Glucose, Bld: 105 mg/dL (ref 70–140)
Potassium: 5 mmol/L (ref 3.5–5.1)
Sodium: 139 mmol/L (ref 136–145)
Total Bilirubin: <0.2 mg/dL — ABNORMAL LOW (ref 0.2–1.2)
Total Protein: 7.9 g/dL (ref 6.4–8.3)

## 2018-03-04 LAB — LACTATE DEHYDROGENASE: LDH: 132 U/L (ref 125–245)

## 2018-03-04 LAB — VITAMIN B12: Vitamin B-12: 2517 pg/mL — ABNORMAL HIGH (ref 180–914)

## 2018-03-04 LAB — SAVE SMEAR

## 2018-03-04 NOTE — Telephone Encounter (Signed)
Printed avs and calender of upcoming appointment. Per 5/23 los 

## 2018-03-05 LAB — THYROID PANEL WITH TSH
Free Thyroxine Index: 1.4 (ref 1.2–4.9)
T3 Uptake Ratio: 24 % (ref 24–39)
T4, Total: 5.7 ug/dL (ref 4.5–12.0)
TSH: 3.06 u[IU]/mL (ref 0.450–4.500)

## 2018-03-05 LAB — KAPPA/LAMBDA LIGHT CHAINS
Kappa free light chain: 37 mg/L — ABNORMAL HIGH (ref 3.3–19.4)
Kappa, lambda light chain ratio: 1.52 (ref 0.26–1.65)
Lambda free light chains: 24.3 mg/L (ref 5.7–26.3)

## 2018-03-05 LAB — FOLATE RBC
Folate, Hemolysate: >620 ng/mL
Folate, RBC: >1623 ng/mL (ref >498)
Hematocrit: 38.2 % (ref 37.5–51.0)

## 2018-03-10 LAB — MULTIPLE MYELOMA PANEL, SERUM
Albumin SerPl Elph-Mcnc: 3.9 g/dL (ref 2.9–4.4)
Albumin/Glob SerPl: 1.3 (ref 0.7–1.7)
Alpha 1: 0.2 g/dL (ref 0.0–0.4)
Alpha2 Glob SerPl Elph-Mcnc: 1 g/dL (ref 0.4–1.0)
B-Globulin SerPl Elph-Mcnc: 1.2 g/dL (ref 0.7–1.3)
Gamma Glob SerPl Elph-Mcnc: 0.7 g/dL (ref 0.4–1.8)
Globulin, Total: 3.1 g/dL (ref 2.2–3.9)
IgA: 301 mg/dL (ref 61–437)
IgG (Immunoglobin G), Serum: 776 mg/dL (ref 700–1600)
IgM (Immunoglobulin M), Srm: 102 mg/dL (ref 15–143)
Total Protein ELP: 7 g/dL (ref 6.0–8.5)

## 2018-03-11 ENCOUNTER — Telehealth: Payer: Self-pay | Admitting: *Deleted

## 2018-03-11 NOTE — Telephone Encounter (Signed)
Received from [name], MD lab results,  Dilantin level 12.4. (results to be scanne to epic).  Dr. Leta Baptist reviewed.

## 2018-03-16 ENCOUNTER — Encounter: Payer: Medicare Other | Attending: Physical Medicine & Rehabilitation | Admitting: Physical Medicine & Rehabilitation

## 2018-03-16 ENCOUNTER — Encounter: Payer: Self-pay | Admitting: Physical Medicine & Rehabilitation

## 2018-03-16 VITALS — BP 177/71 | HR 55 | Ht 64.0 in | Wt 146.0 lb

## 2018-03-16 DIAGNOSIS — I252 Old myocardial infarction: Secondary | ICD-10-CM | POA: Insufficient documentation

## 2018-03-16 DIAGNOSIS — I69354 Hemiplegia and hemiparesis following cerebral infarction affecting left non-dominant side: Secondary | ICD-10-CM | POA: Diagnosis not present

## 2018-03-16 DIAGNOSIS — I6330 Cerebral infarction due to thrombosis of unspecified cerebral artery: Secondary | ICD-10-CM | POA: Diagnosis not present

## 2018-03-16 DIAGNOSIS — Z951 Presence of aortocoronary bypass graft: Secondary | ICD-10-CM | POA: Diagnosis not present

## 2018-03-16 DIAGNOSIS — Z8249 Family history of ischemic heart disease and other diseases of the circulatory system: Secondary | ICD-10-CM | POA: Diagnosis not present

## 2018-03-16 DIAGNOSIS — Z85828 Personal history of other malignant neoplasm of skin: Secondary | ICD-10-CM | POA: Insufficient documentation

## 2018-03-16 DIAGNOSIS — Z87891 Personal history of nicotine dependence: Secondary | ICD-10-CM | POA: Diagnosis not present

## 2018-03-16 DIAGNOSIS — G8110 Spastic hemiplegia affecting unspecified side: Secondary | ICD-10-CM | POA: Diagnosis not present

## 2018-03-16 DIAGNOSIS — I2510 Atherosclerotic heart disease of native coronary artery without angina pectoris: Secondary | ICD-10-CM | POA: Diagnosis not present

## 2018-03-16 DIAGNOSIS — G20 Parkinson's disease: Secondary | ICD-10-CM | POA: Diagnosis not present

## 2018-03-16 DIAGNOSIS — G40909 Epilepsy, unspecified, not intractable, without status epilepticus: Secondary | ICD-10-CM | POA: Insufficient documentation

## 2018-03-16 DIAGNOSIS — C679 Malignant neoplasm of bladder, unspecified: Secondary | ICD-10-CM | POA: Diagnosis not present

## 2018-03-16 DIAGNOSIS — E785 Hyperlipidemia, unspecified: Secondary | ICD-10-CM | POA: Insufficient documentation

## 2018-03-16 DIAGNOSIS — E1151 Type 2 diabetes mellitus with diabetic peripheral angiopathy without gangrene: Secondary | ICD-10-CM | POA: Diagnosis not present

## 2018-03-16 NOTE — Patient Instructions (Signed)
PLEASE FEEL FREE TO CALL OUR OFFICE WITH ANY PROBLEMS OR QUESTIONS ([phone])      

## 2018-03-16 NOTE — Progress Notes (Signed)
Subjective:    Patient ID: [name], male    DOB: 04-01-1946, 72 y.o.   MRN: [id number]  HPI   [name] is here in follow up his chronic gait disorder. [name] has had further struggles with gait and balance in the setting of his new stroke and PD. [name] has lost weight as his diet has changed to improved his diabetic control.   [name] feels that his left shoulder is heavier and hes having more problems holding it when [name] walks.  [name] had some interest in pursuing a shoulder harness thinking it may help with his short arm position when [name] is up walking.  [name] has found that when [name] walks, due to his Parkinson's disease, that [name] leans quite a bit forward in his left arm comes along with it often getting in the way of his gait.  [name] denies any significant left shoulder pain.  [name] would like to get back to his cane and away from using his Rollator.  Wife states that [name] has had minimal falls as of late.  [name] does tend to bump the left arm frequently however during transfers and mobility.  [name] was seen recently by hematology last month working up a macrocytosis.  I did review most of the labs which appeared generally unremarkable although I did not exhaustively review them.  Pain Inventory Average Pain 0 Pain Right Now hurts on movement My pain is intermittent and pulling  In the last 24 hours, has pain interfered with the following? General activity 0 Relation with others 0 Enjoyment of life 0 What TIME of day is your pain at its worst? varies Sleep (in general) Good  Pain is worse with: some activites Pain improves with: rest Relief from Meds: .  Mobility walk with assistance use a walker use a wheelchair  Function retired  Neuro/Psych weakness numbness tremor tingling trouble walking confusion  Prior Studies Any changes since last visit?  no  Physicians involved in your care Any changes since last visit?  no   Family History  Problem Relation Age of Onset  .  Stroke Father   . Heart attack Father   . Stroke Mother   . Diabetes Brother   . Hyperlipidemia Brother   . Hypertension Brother   . Hyperlipidemia Sister   . Hypertension Sister    Social History   Socioeconomic History  . Marital status: Divorced    Spouse name: Not on file  . Number of children: 4  . Years of education: HS  . Highest education level: Not on file  Occupational History    Employer: RETIRED  Social Needs  . Financial resource strain: Not on file  . Food insecurity:    Worry: Not on file    Inability: Not on file  . Transportation needs:    Medical: Not on file    Non-medical: Not on file  Tobacco Use  . Smoking status: Former Smoker    Years: 5.00    Types: Cigarettes    Last attempt to quit: 11/14/2007    Years since quitting: 10.3  . Smokeless tobacco: Never Used  Substance and Sexual Activity  . Alcohol use: No  . Drug use: No  . Sexual activity: Not on file  Lifestyle  . Physical activity:    Days per week: Not on file    Minutes per session: Not on file  . Stress: Not on file  Relationships  . Social connections:    Talks  on phone: Not on file    Gets together: Not on file    Attends religious service: Not on file    Active member of club or organization: Not on file    Attends meetings of clubs or organizations: Not on file    Relationship status: Not on file  Other Topics Concern  . Not on file  Social History Narrative   Patient lives at home with his caregiver/ex-wife.   Caffeine- 2 cups of coffee daily   Past Surgical History:  Procedure Laterality Date  . CARDIAC CATHETERIZATION  11/17/2002   significant left main disease and 3 vessel CAD, mildly depressed LV systolic  fx, 86% left renal artery stenosis  . CAROTID ENDARTERECTOMY  12/1997   [facility])   Bilateral ICA  . CAROTID STENT INSERTION Bilateral dr berry and dr Kristine Royal--  (In-stent Restenosis)  LeftICA   02-12-2011;  [place]   02-26-2011  . CORONARY ARTERY BYPASS  GRAFT  2/ 2004   dr hendrickson   x3 LIMA to LAD,free right internal mammary artery to obtuse marginal 1,SVG to distal right coronary.  . CYSTOSCOPY W/ RETROGRADES Bilateral 09/10/2015   Procedure: CYSTOSCOPY WITH RETROGRADE PYELOGRAM;  Surgeon: Carolan Clines, MD;  Location: [facility];  Service: Urology;  Laterality: Bilateral;  . CYSTOSCOPY WITH BIOPSY N/A 09/10/2015   Procedure: CYSTOSCOPY WITH BIOPSY,RIGHT TRIGONAL TUMOR 1.5 CM, EXCISIONAL BIOPSY WITH TAUBER FORCEP RIGHT POSTERIOR BLADDER WALL 1 CM, EXCISIONAL BIOPSY SATELITE BLADDER WALL 1 CM;  Surgeon: Carolan Clines, MD;  Location: [facility];  Service: Urology;  Laterality: N/A;  . CYSTOSCOPY WITH BIOPSY N/A 03/26/2017   Procedure: CYSTOSCOPY WITH BIOPSY/;  Surgeon: Carolan Clines, MD;  Location: [facility];  Service: Urology;  Laterality: N/A;  . CYSTOSCOPY WITH HYDRODISTENSION AND BIOPSY N/A 10/29/2015   Procedure: REPEAT CYSTOSCOPY BIOPSY BLADDER DEEP MUSCLES BLADDER BIOPSY;  Surgeon: Carolan Clines, MD;  Location: [place];  Service: Urology;  Laterality: N/A;  . CYSTOSCOPY WITH URETHRAL DILATATION N/A 09/10/2015   Procedure: CYSTOSCOPY WITH URETHRAL DILATATION;  Surgeon: Carolan Clines, MD;  Location: [place];  Service: Urology;  Laterality: N/A;  . FULGURATION OF BLADDER TUMOR N/A 03/26/2017   Procedure: FULGURATION OF BLADDER TUMOR;  Surgeon: Carolan Clines, MD;  Location: [facility];  Service: Urology;  Laterality: N/A;  . [place]  . MOHS SURGERY  06-20-2015   side of nose  . NM MYOCAR PERF WALL MOTION  01/09/2011   dr berry   mild to mod. perfusion defect in the basal inferolateral & mid inferolaterl regions consistant with an infarct/scar, no signigicant ischemia demonstracted/  Low Risk scan, no sig. change from previous study/  normal LV function and wall motion , ef 63%  . ORIF RIGHT ANKLE FX  1998   hardware  retained  . TRANSURETHRAL RESECTION OF BLADDER TUMOR N/A 09/10/2015   Procedure: CYSTO TRANSURETHRAL RESECTION OF BLADDER TUMOR (TURBT) OF LEFT POSTERIOR BLADDER TUMOR 2 CM;  Surgeon: Carolan Clines, MD;  Location[facility];  Service: Urology;  Laterality: N/A;  . TRANSURETHRAL RESECTION OF BLADDER TUMOR Left 04/17/2016   Procedure: TRANSURETHRAL RESECTION OF BLADDER TUMOR (TURBT);  Surgeon: Carolan Clines, MD;  Location: [facility];  Service: Urology;  Laterality: Left;  . US ECHOCARDIOGRAPHY  01/03/2011   normal LVF, ef>55%/  mild LAE/ trace Luis and TR,  mild AV sclerosis without stenosis   Past Medical History:  Diagnosis Date  . At high  risk for falls   . Bladder cancer [facility])    urologist-  dr Gaynelle Arabian  . CAD (coronary artery disease) cardiologist-  dr berry  . Cerebrovascular arteriosclerosis   . Gait disturbance, post-stroke    uses cane, rollator, and w/c long distance  . Hemiparkinsonism [facility]) neurologist-- dr Leta Baptist   right side body  . History of basal cell carcinoma excision    Sept 2016--  MOH's surgery side of nose  . History of carotid artery stenosis cardiologist-- dr berry   bilateral --  1999 s/p  bilateral ICA endarterectomy and recurrent restenosis 2012  s/p  staged bilateral stenting   per last duplex 2015  stents widely patent  . History of CVA with residual deficit neurologist-  dr Leta Baptist   3/ 1999  Right MCA  and  12/ 2004  Anterior division of  Right MCA ---  residual left spactic hemiparesis and left foot drop  . History of MI (myocardial infarction)    02/ 2004   s/p  cabg   . HOH (hard of hearing)    LEFT EAR  POST cva  . Hyperextension deformity of left knee    wears brace  . Hyperlipidemia   . Hypertension   . Left foot drop    residual from CVA  -- wears brace  . Left spastic hemiparesis ([place])    residual CVA 1999  . Pancreatic pseudocyst   . Peripheral arterial disease ([place])   . S/P CABG x 4    02/ 2004  . Seizure  disorder [facility]) last seizure 2011 due to confusion   neurologist-  dr Leta Baptist-- per note seizure documented 2008 breakthrough partial complex seizure (prior tonic-clonic seizure's post CVA)  . Type 2 diabetes, diet controlled ([place])     2  . Visual neglect    LEFT EYE   There were no vitals taken for this visit.  Opioid Risk Score:   Fall Risk Score:  `1  Depression screen PHQ 2/9  No flowsheet data found.   Review of Systems  Constitutional: Negative.   HENT: Negative.   Eyes: Negative.   Respiratory: Negative.   Cardiovascular: Negative.   Gastrointestinal: Negative.   Endocrine: Negative.   Genitourinary: Negative.   Musculoskeletal: Positive for gait problem.  Skin: Negative.   Allergic/Immunologic: Negative.   Neurological: Positive for tremors, weakness and numbness.  Hematological: Negative.   Psychiatric/Behavioral: Positive for confusion.  All other systems reviewed and are negative.      Objective:   Physical Exam  General: No acute distress HEENT: EOMI, oral membranes moist Cards: reg rate  Chest: normal effort Abdomen: Soft, NT, ND Skin: dry, intact Extremities: no edema Abdominal: Soft. Bowel sounds are normal.  Musculoskeletal: Left shoulder subluxed inferiorly by about 1/4 inch.  There is minimal pain with range of motion.    Neurological: [name] is alert and oriented to person, place, and time. A cranial nerve deficitis present.  LLE is 1-3/5. LUE is 1-3/5. Spasticity remains trace to 1/4 in LUE including wrist, HI, biceps, shoulder .Marland Kitchen Sensation 1/2 in both upper and lower.  Mild tremor.  Minimal rigidity.  When patient stood with assistance developed flexion and tone at the left biceps and brachioradialis which was 2 out of 4.  Once [name] relaxed and sat down reduced to trace out of 4. Psychiatric: [name] has a normal mood and affect. His behavior is normal. Judgment and thought content normal.  Skin: Appears intact except for a few bruises.  Assessment & Plan:  ASSESSMENT:  1. Right cerebrovascular accident, spastic left hemiparesis.  2. Seizure disorder.  3. Parkinson's disease 4. Bladder cancer   PLAN:  1. Continue sinemet for PD symptoms.May benefit from increase of sinemet 50/200 to QID instead of current schedule which is 50/200, 50/200, 25/100, 25/100 currently            -would like to make referral to neuro-rehab to address gait and balance strategies as they pertain to his PD and prior CVA's with left hemiparesis  -We discussed the use of a shoulder harness today.  While the shoulder harness may help somewhat with shoulder pain and subluxation I cannot see that it would help his elbow flexion and shoulder flexion either.  -Might benefit from Botox injections to the biceps although I am not sure if they want to go that route. 2. Continue with Dilantin and keppra per neuro recs.   3. Dantrium will be continued for spasticity management.  Liver function tests have been followed by primary team. 4. Bladder cancer mgt per urology. 5.  Hematology work up for macrocytosis 12 months follow up. Fifteen minutes of face to face patient care time were spent during this visit. All questions were encouraged and answered.

## 2018-04-01 ENCOUNTER — Telehealth: Payer: Self-pay

## 2018-04-01 NOTE — Telephone Encounter (Signed)
Called and spoke to care giver and confirmed appointment. Per 6/19 sch msg

## 2018-04-05 NOTE — Progress Notes (Signed)
HEMATOLOGY/ONCOLOGY CLINIC NOTE  Date of Service: 04/06/18  Patient Care Team: [name], MD as PCP - General (Family Medicine) Penni Bombard, MD as Consulting Physician (Neurology) Lorretta Harp, MD as Consulting Physician (Cardiology)  CHIEF COMPLAINTS/PURPOSE OF CONSULTATION:  Macrocytosis without Anemia  HISTORY OF PRESENTING ILLNESS:   [name] is a wonderful 72 y.o. male who has been referred to [place] by Dr [name] for evaluation and management of Macrocytosis without Anemia. He is accompanied today by his caregiver. The pt reports that he is doing well overall.   The pt reports that he has been on intravesical BCG treatments with Dr Merlyn Albert in Urology.  The pt has taken seizure medication for 10 years including Keppra and Dilantin. He takes Folic Acid, and three B Vitamin supplements daily. He denies a history of thyroid problems. He notes that he bruises easily and frequently bruises his left arm, which has lost motor control, as it tends to hang and hit objects. He also takes a nerve blocker. He takes Sinemet.   He notes that he was 189 pounds a year and a half ago, and is today 144 pounds. He notes that his diabetes has been well controlled. He notes that his glucose ranges between 80-120. He also notes fatigue that hasn't changed in the last 6 months. He notes that leg weakness informs his fatigue most. He denies any swallowing problems. He hasn't had PT.   He notes that he has had bladder cancer, with tumor resection and BCG treatment four times with no tumors deeper than the lining. He didn't have any constitutional symptoms with BCG treatment.   Most recent lab results (02/11/18) of CBC  is as follows: all values are WNL except for RBC at 3.75, HGB at 12.4, HCT at 38.2, MCV at 101.9, MCH at 33.2.  On review of systems, pt reports fatigue, weight loss, bruising, and denies abdominal pains, leg swelling and any other symptoms.   On PMHx the pt  reports CAD s/p CABG, seizure, CVA, TIAs, HTN, DM type 2, Parkinson's disease. On Social Hx the pt denies consuming ETOH and has not smoked a cigarette in 10 years.  Interval History:   [name] returns today regarding his macrocytosis without anemia. The patient's last visit with [place] was on 03/04/18. He is accompanied today by his caregiver. The pt reports that he is doing well overall.   The pt reports that he is taking Vitamin B12 and B6 replacement as well as folic acid. He continues to take Dilantin  and BCG vaccine.   Lab results (03/04/18) of CBC, CMP is as follows: all values are WNL except for BUN at 36, Total Bilirubin <0.2, RBC at 3.88, MCV at 101.5, MCH at 33.5. Vitamin B12 from 03/04/18 was elevated at 2517 MMP 03/04/18 showed all values WNL including M Protein as not observed. LDH 03/04/18 was normal 132 Folate,RBC 03/04/18 showed all values WNL. Thyroid panel w/ TSH 03/04/18 showed all values WNL.  On review of systems, pt reports stable bruising, and denies leg swelling, and any other symptoms.    MEDICAL HISTORY:  Past Medical History:  Diagnosis Date  . At high risk for falls   . Bladder cancer [facility])    urologist-  dr Gaynelle Arabian  . CAD (coronary artery disease) cardiologist-  dr berry  . Cerebrovascular arteriosclerosis   . Gait disturbance, post-stroke    uses cane, rollator, and w/c long distance  . Hemiparkinsonism [facility]) neurologist-- dr Leta Baptist  right side body  . History of basal cell carcinoma excision    Sept 2016--  MOH's surgery side of nose  . History of carotid artery stenosis cardiologist-- dr berry   bilateral --  1999 s/p  bilateral ICA endarterectomy and recurrent restenosis 2012  s/p  staged bilateral stenting   per last duplex 2015  stents widely patent  . History of CVA with residual deficit neurologist-  dr Leta Baptist   3/ 1999  Right MCA  and  12/ 2004  Anterior division of  Right MCA ---  residual left spactic hemiparesis and left foot drop    . History of MI (myocardial infarction)    02/ 2004   s/p  cabg   . HOH (hard of hearing)    LEFT EAR  POST cva  . Hyperextension deformity of left knee    wears brace  . Hyperlipidemia   . Hypertension   . Left foot drop    residual from CVA  -- wears brace  . Left spastic hemiparesis ([place])    residual CVA 1999  . Pancreatic pseudocyst   . Peripheral arterial disease ([place])   . S/P CABG x 4    02/ 2004  . Seizure disorder [facility]) last seizure 2011 due to confusion   neurologist-  dr Leta Baptist-- per note seizure documented 2008 breakthrough partial complex seizure (prior tonic-clonic seizure's post CVA)  . Type 2 diabetes, diet controlled ([place])     2  . Visual neglect    LEFT EYE    SURGICAL HISTORY: Past Surgical History:  Procedure Laterality Date  . CARDIAC CATHETERIZATION  11/17/2002   significant left main disease and 3 vessel CAD, mildly depressed LV systolic  fx, 41% left renal artery stenosis  . CAROTID ENDARTERECTOMY  12/1997   [facility])   Bilateral ICA  . CAROTID STENT INSERTION Bilateral dr berry and dr Kristine Royal--  (In-stent Restenosis)  LeftICA   02-12-2011;  [place]   02-26-2011  . CORONARY ARTERY BYPASS GRAFT  2/ 2004   dr hendrickson   x3 LIMA to LAD,free right internal mammary artery to obtuse marginal 1,SVG to distal right coronary.  . CYSTOSCOPY W/ RETROGRADES Bilateral 09/10/2015   Procedure: CYSTOSCOPY WITH RETROGRADE PYELOGRAM;  Surgeon: Carolan Clines, MD;  Location: [facility];  Service: Urology;  Laterality: Bilateral;  . CYSTOSCOPY WITH BIOPSY N/A 09/10/2015   Procedure: CYSTOSCOPY WITH BIOPSY,RIGHT TRIGONAL TUMOR 1.5 CM, EXCISIONAL BIOPSY WITH TAUBER FORCEP RIGHT POSTERIOR BLADDER WALL 1 CM, EXCISIONAL BIOPSY SATELITE BLADDER WALL 1 CM;  Surgeon: Carolan Clines, MD;  Location: [facility];  Service: Urology;  Laterality: N/A;  . CYSTOSCOPY WITH BIOPSY N/A 03/26/2017   Procedure: CYSTOSCOPY WITH BIOPSY/;   Surgeon: Carolan Clines, MD;  Location: [facility];  Service: Urology;  Laterality: N/A;  . CYSTOSCOPY WITH HYDRODISTENSION AND BIOPSY N/A 10/29/2015   Procedure: REPEAT CYSTOSCOPY BIOPSY BLADDER DEEP MUSCLES BLADDER BIOPSY;  Surgeon: Carolan Clines, MD;  Location: [place];  Service: Urology;  Laterality: N/A;  . CYSTOSCOPY WITH URETHRAL DILATATION N/A 09/10/2015   Procedure: CYSTOSCOPY WITH URETHRAL DILATATION;  Surgeon: Carolan Clines, MD;  Location: [place];  Service: Urology;  Laterality: N/A;  . FULGURATION OF BLADDER TUMOR N/A 03/26/2017   Procedure: FULGURATION OF BLADDER TUMOR;  Surgeon: Carolan Clines, MD;  Location: [facility];  Service: Urology;  Laterality: N/A;  . [place]  . MOHS SURGERY  06-20-2015   side of nose  .  NM MYOCAR PERF WALL MOTION  01/09/2011   dr berry   mild to mod. perfusion defect in the basal inferolateral & mid inferolaterl regions consistant with an infarct/scar, no signigicant ischemia demonstracted/  Low Risk scan, no sig. change from previous study/  normal LV function and wall motion , ef 63%  . ORIF RIGHT ANKLE FX  1998   hardware retained  . TRANSURETHRAL RESECTION OF BLADDER TUMOR N/A 09/10/2015   Procedure: CYSTO TRANSURETHRAL RESECTION OF BLADDER TUMOR (TURBT) OF LEFT POSTERIOR BLADDER TUMOR 2 CM;  Surgeon: Carolan Clines, MD;  Location[facility];  Service: Urology;  Laterality: N/A;  . TRANSURETHRAL RESECTION OF BLADDER TUMOR Left 04/17/2016   Procedure: TRANSURETHRAL RESECTION OF BLADDER TUMOR (TURBT);  Surgeon: Carolan Clines, MD;  Location[facility];  Service: Urology;  Laterality: Left;  . US ECHOCARDIOGRAPHY  01/03/2011   normal LVF, ef>55%/  mild LAE/ trace MR and TR,  mild AV sclerosis without stenosis    SOCIAL HISTORY: Social History   Socioeconomic History  . Marital status: Divorced    Spouse name: Not on file  . Number of children: 4  . Years  of education: HS  . Highest education level: Not on file  Occupational History    Employer: RETIRED  Social Needs  . Financial resource strain: Not on file  . Food insecurity:    Worry: Not on file    Inability: Not on file  . Transportation needs:    Medical: Not on file    Non-medical: Not on file  Tobacco Use  . Smoking status: Former Smoker    Years: 5.00    Types: Cigarettes    Last attempt to quit: 11/14/2007    Years since quitting: 10.4  . Smokeless tobacco: Never Used  Substance and Sexual Activity  . Alcohol use: No  . Drug use: No  . Sexual activity: Not on file  Lifestyle  . Physical activity:    Days per week: Not on file    Minutes per session: Not on file  . Stress: Not on file  Relationships  . Social connections:    Talks on phone: Not on file    Gets together: Not on file    Attends religious service: Not on file    Active member of club or organization: Not on file    Attends meetings of clubs or organizations: Not on file    Relationship status: Not on file  . Intimate partner violence:    Fear of current or ex partner: Not on file    Emotionally abused: Not on file    Physically abused: Not on file    Forced sexual activity: Not on file  Other Topics Concern  . Not on file  Social History Narrative   Patient lives at home with his caregiver/ex-wife.   Caffeine- 2 cups of coffee daily    FAMILY HISTORY: Family History  Problem Relation Age of Onset  . Stroke Father   . Heart attack Father   . Stroke Mother   . Diabetes Brother   . Hyperlipidemia Brother   . Hypertension Brother   . Hyperlipidemia Sister   . Hypertension Sister     ALLERGIES:  is allergic to altace [ramipril].  MEDICATIONS:  Current Outpatient Medications  Medication Sig Dispense Refill  . BCG vaccine 81 mg in sodium chloride 0.9 % 50 mL Instill 81 mg into the bladder once a week.    . carbidopa-levodopa (SINEMET IR) 25-100 MG  tablet Take 2 tablets by mouth 3 (three)  times daily. 540 tablet 4  . cholecalciferol (VITAMIN D) 1000 units tablet Take 2,000 Units by mouth daily with breakfast.     . Coenzyme Q10 (COQ-10) 50 MG CAPS Take by mouth 2 (two) times daily.     . dantrolene (DANTRIUM) 50 MG capsule Take 1 capsule (50 mg total) by mouth 2 (two) times daily. 180 capsule 3  . DILANTIN 100 MG ER capsule Take 1 capsule (100 mg total) by mouth 3 (three) times daily. 270 capsule 4  . dipyridamole-aspirin (AGGRENOX) 200-25 MG 12hr capsule Take 1 capsule by mouth 2 (two) times daily. 180 capsule 4  . ezetimibe (ZETIA) 10 MG tablet TAKE ONE TABLET (10MG ) BY MOUTH DAILY 90 tablet 3  . fenofibrate (TRICOR) 48 MG tablet TAKE 1 TABLET BY MOUTH DAILY 90 tablet 3  . Flaxseed, Linseed, 1000 MG CAPS Take 1,000 mg by mouth every evening.     Marland Kitchen FOLBIC 2.5-25-2 MG TABS tablet TAKE 1 TABLET DAILY WITH FOOD 90 tablet 2  . insulin lispro (HUMALOG KWIKPEN) 100 UNIT/ML KiwkPen Inject 4 Units into the skin See admin instructions. Takes 3 times daily before meals for sugar over 200 only    . KEPPRA 500 MG tablet Take 1 tablet (500 mg total) by mouth 2 (two) times daily. 180 tablet 4  . metoprolol tartrate (LOPRESSOR) 25 MG tablet TAKE ONE TABLET BY MOUTH DAILY (KEEP OFFICE VISIT) 90 tablet 3  . nitroGLYCERIN (NITROSTAT) 0.4 MG SL tablet Place 1 tablet (0.4 mg total) under the tongue every 5 (five) minutes as needed for chest pain. 25 tablet 3  . olmesartan (BENICAR) 20 MG tablet Take 1 tablet (20 mg total) by mouth daily. 90 tablet 1  . ONETOUCH DELICA LANCETS 14H MISC     . ONETOUCH VERIO test strip     . RAPAFLO 8 MG CAPS capsule Take 8 mg by mouth daily with breakfast.     . rosuvastatin (CRESTOR) 20 MG tablet Take 20 mg by mouth daily.    . B Complex-C-Folic Acid (HM VITAMIN B COMPLEX/VITAMIN C) TABS Take by mouth.     No current facility-administered medications for this visit.     REVIEW OF SYSTEMS:    A 10+ POINT REVIEW OF SYSTEMS WAS OBTAINED including neurology,  dermatology, psychiatry, cardiac, respiratory, lymph, extremities, GI, GU, Musculoskeletal, constitutional, breasts, reproductive, HEENT.  All pertinent positives are noted in the HPI.  All others are negative.   PHYSICAL EXAMINATION:  . Vitals:   04/06/18 0905  BP: (!) 142/72  Pulse: (!) 56  Resp: 18  Temp: 97.9 F (36.6 C)  SpO2: 100%   Filed Weights   04/06/18 0905  Weight: 147 lb 1.6 oz (66.7 kg)   .Body mass index is 25.25 kg/m.  GENERAL:alert, in no acute distress and comfortable SKIN: no acute rashes, no significant lesions EYES: conjunctiva are pink and non-injected, sclera anicteric OROPHARYNX: MMM, no exudates, no oropharyngeal erythema or ulceration NECK: supple, no JVD LYMPH:  no palpable lymphadenopathy in the cervical, axillary or inguinal regions LUNGS: clear to auscultation b/l with normal respiratory effort HEART: regular rate & rhythm ABDOMEN:  normoactive bowel sounds , non tender, not distended. Extremity: no pedal edema PSYCH: alert & oriented x 3 with fluent speech NEURO: no focal motor/sensory deficits   LABORATORY DATA:  I have reviewed the data as listed  . CBC Latest Ref Rng & Units 03/04/2018 03/04/2018 03/23/2017  WBC 4.0 - 10.3 K/uL  6.8 - 8.1  Hemoglobin 13.0 - 17.1 g/dL 13.0 - 13.4  Hematocrit 37.5 - 51.0 % 38.2 39.4 39.7  Platelets 140 - 400 K/uL 179 - 198   . CBC    Component Value Date/Time   WBC 6.8 03/04/2018 1434   RBC 3.88 (L) 03/04/2018 1434   HGB 13.0 03/04/2018 1434   HCT 38.2 03/04/2018 1435   PLT 179 03/04/2018 1434   MCV 101.5 (H) 03/04/2018 1434   MCH 33.5 (H) 03/04/2018 1434   MCHC 33.0 03/04/2018 1434   RDW 13.4 03/04/2018 1434   LYMPHSABS 1.8 03/04/2018 1434   MONOABS 0.9 03/04/2018 1434   EOSABS 0.3 03/04/2018 1434   BASOSABS 0.0 03/04/2018 1434    . Lab Results  Component Value Date   RBC 3.88 (L) 03/04/2018     . CMP Latest Ref Rng & Units 03/04/2018 10/08/2017 03/23/2017  Glucose 70 - 140 mg/dL 105 -  121(H)  BUN 7 - 26 mg/dL 36(H) - 27(H)  Creatinine 0.70 - 1.30 mg/dL 1.27 - 1.13  Sodium 136 - 145 mmol/L 139 - 140  Potassium 3.5 - 5.1 mmol/L 5.0 - 5.0  Chloride 98 - 109 mmol/L 106 - 107  CO2 22 - 29 mmol/L 28 - 26  Calcium 8.4 - 10.4 mg/dL 9.8 - 9.6  Total Protein 6.4 - 8.3 g/dL 7.9 6.9 7.4  Total Bilirubin 0.2 - 1.2 mg/dL <0.2(L) <0.2 0.4  Alkaline Phos 40 - 150 U/L 60 54 57  AST 5 - 34 U/L 22 20 22   ALT 0 - 55 U/L 6 8 5(L)   Component     Latest Ref Rng & Units 03/04/2018  IgG (Immunoglobin G), Serum     700 - 1,600 mg/dL 776  IgA     61 - 437 mg/dL 301  IgM (Immunoglobulin M), Srm     15 - 143 mg/dL 102  Total Protein ELP     6.0 - 8.5 g/dL 7.0  Albumin SerPl Elph-Mcnc     2.9 - 4.4 g/dL 3.9  Alpha 1     0.0 - 0.4 g/dL 0.2  Alpha2 Glob SerPl Elph-Mcnc     0.4 - 1.0 g/dL 1.0  B-Globulin SerPl Elph-Mcnc     0.7 - 1.3 g/dL 1.2  Gamma Glob SerPl Elph-Mcnc     0.4 - 1.8 g/dL 0.7  M Protein SerPl Elph-Mcnc     Not Observed g/dL Not Observed  Globulin, Total     2.2 - 3.9 g/dL 3.1  Albumin/Glob SerPl     0.7 - 1.7 1.3  IFE 1      Comment  Please Note (HCV):      Comment  TSH     0.450 - 4.500 uIU/mL 3.060  Thyroxine (T4)     4.5 - 12.0 ug/dL 5.7  T3 Uptake Ratio     24 - 39 % 24  Free Thyroxine Index     1.2 - 4.9 1.4  Folate, Hemolysate     Not Estab. ng/mL >620.0  HCT     37.5 - 51.0 % 38.2  Folate, RBC     >498 ng/mL >1,623  Kappa free light chain     3.3 - 19.4 mg/L 37.0 (H)  Lamda free light chains     5.7 - 26.3 mg/L 24.3  Kappa, lamda light chain ratio     0.26 - 1.65 1.52  Vitamin B12     180 - 914 pg/mL 2,517 (H)  LDH  125 - 245 U/L 132    RADIOGRAPHIC STUDIES: I have personally reviewed the radiological images as listed and agreed with the findings in the report. No results found.  ASSESSMENT & PLAN:  72 y.o. male with  1. RBC Macrocytosis without Anemia -Pt takes Keppra and Dilantin which could be causing  macrocytosis. -denies significant ETOH use -checked  thyroid levels- WNL - B12 levels and RBC folate levels wnl -LDH WNL - no overt evidence of hemolysis at this time -SPEP and SFLC - no evidence of monoclonal paraproteinemia. PLAN -Discussed pt labwork from 03/04/18; no anemia, WBC normal, PLT normal, M Protein not observed, Vitamin B12 elevated and not deficient, no hemolysis with LDH normal at 132. -No alternative etiology for macrocytosis indicated besides the pt taking Dilantin, and due to benefit of Dilantin, pt should continue this.  -Discussed that MDS cannot be ruled out, and further work up would be indicated by abnormal Hgb, WBC, PLT.  -Discussed that BM Bx is not currently indicated by clinical or lab picture to rule out MDS -Begin taking OTC Vitamin B complex -If macrocytosis is a function of medications, this would certainly not be prohibitive from continuing these imp anti SZ medications -If further changes in CBC occur, a further work up will be considered  2. . Patient Active Problem List   Diagnosis Date Noted  . Essential hypertension 09/25/2015  . Parkinsonism ([place]) 04/19/2015  . Essential tremor 04/19/2015  . Parkinson's disease ([place]) 06/28/2014  . Coronary artery disease 03/24/2014  . Hyperlipidemia 03/24/2014  . Aftercare following surgery of the circulatory system, [place] 11/14/2013  . Chronic ischemic right MCA stroke 03/08/2013  . Seizure disorder ([place]) 03/08/2013  . Cerebral thrombosis with cerebral infarction ([place]) 03/29/2012  . Spastic hemiplegia affecting nondominant side ([place]) 03/29/2012  . Seizure ([place]) 03/29/2012  . Occlusion and stenosis of carotid artery without mention of cerebral infarction 10/20/2011   -conitnue f/u with PCP for mx of chronic medical issues  RTC with Dr Irene Limbo as needed   All of the patients questions were answered with apparent satisfaction. The patient knows to call the clinic with any problems, questions or concerns.  The toal  time spent in the appt was 20 minutes and more than 50% was on counseling and direct patient cares.    Sullivan Lone MD MS AAHIVMS [facility] Hematology/Oncology Physician [facility]  (Office):       [phone] (Work cell):  [phone] (Fax):           [phone]  04/06/2018 9:29 AM  I, Baldwin [place], am acting as a Education administrator for Dr Irene Limbo.   .I have reviewed the above documentation for accuracy and completeness, and I agree with the above. Brunetta Genera MD

## 2018-04-06 ENCOUNTER — Encounter: Payer: Self-pay | Admitting: Hematology

## 2018-04-06 ENCOUNTER — Inpatient Hospital Stay: Payer: Medicare Other | Attending: Hematology | Admitting: Hematology

## 2018-04-06 VITALS — BP 142/72 | HR 56 | Temp 97.9°F | Resp 18 | Ht 64.0 in | Wt 147.1 lb

## 2018-04-06 DIAGNOSIS — D7589 Other specified diseases of blood and blood-forming organs: Secondary | ICD-10-CM | POA: Insufficient documentation

## 2018-04-06 DIAGNOSIS — Z79899 Other long term (current) drug therapy: Secondary | ICD-10-CM | POA: Diagnosis not present

## 2018-04-07 ENCOUNTER — Telehealth: Payer: Self-pay

## 2018-04-07 NOTE — Telephone Encounter (Signed)
Spoke with care giver and she is aware of the cancellation. Per 6/25 los

## 2018-04-08 ENCOUNTER — Encounter: Payer: Self-pay | Admitting: Physical Therapy

## 2018-04-08 ENCOUNTER — Ambulatory Visit: Payer: Medicare Other | Attending: Physical Medicine & Rehabilitation | Admitting: Physical Therapy

## 2018-04-08 DIAGNOSIS — R2681 Unsteadiness on feet: Secondary | ICD-10-CM | POA: Diagnosis present

## 2018-04-08 DIAGNOSIS — R2689 Other abnormalities of gait and mobility: Secondary | ICD-10-CM | POA: Diagnosis not present

## 2018-04-08 DIAGNOSIS — R293 Abnormal posture: Secondary | ICD-10-CM | POA: Insufficient documentation

## 2018-04-08 DIAGNOSIS — M6281 Muscle weakness (generalized): Secondary | ICD-10-CM | POA: Insufficient documentation

## 2018-04-09 NOTE — Therapy (Signed)
[facility] [address] [place], [place], [address] Phone: [phone]   Fax:  [phone]  Physical Therapy Evaluation  Patient Details  Name: [name] MRN: [id number] Date of Birth: December 19, 1945 Referring Provider: Naaman Plummer   Encounter Date: 04/08/2018  PT End of Session - 04/09/18 1315    Visit Number  1    Number of Visits  10    Date for PT Re-Evaluation  07/07/18    Authorization Type  [employer] Medicare    Authorization Time Period  cert period:  3/61/44-12/26/38    PT Start Time  0850    PT Stop Time  0934    PT Time Calculation (min)  44 min    Equipment Utilized During Treatment  Gait belt    Activity Tolerance  Patient tolerated treatment well    Behavior During Therapy  [facility] for tasks assessed/performed       Past Medical History:  Diagnosis Date  . At high risk for falls   . Bladder cancer [facility])    urologist-  dr Gaynelle Arabian  . CAD (coronary artery disease) cardiologist-  dr berry  . Cerebrovascular arteriosclerosis   . Gait disturbance, post-stroke    uses cane, rollator, and w/c long distance  . Hemiparkinsonism [facility]) neurologist-- dr Leta Baptist   right side body  . History of basal cell carcinoma excision    Sept 2016--  MOH's surgery side of nose  . History of carotid artery stenosis cardiologist-- dr berry   bilateral --  1999 s/p  bilateral ICA endarterectomy and recurrent restenosis 2012  s/p  staged bilateral stenting   per last duplex 2015  stents widely patent  . History of CVA with residual deficit neurologist-  dr Leta Baptist   3/ 1999  Right MCA  and  12/ 2004  Anterior division of  Right MCA ---  residual left spactic hemiparesis and left foot drop  . History of MI (myocardial infarction)    02/ 2004   s/p  cabg   . HOH (hard of hearing)    LEFT EAR  POST cva  . Hyperextension deformity of left knee    wears brace  . Hyperlipidemia   . Hypertension   . Left foot drop    residual from  CVA  -- wears brace  . Left spastic hemiparesis ([place])    residual CVA 1999  . Pancreatic pseudocyst   . Peripheral arterial disease ([place])   . S/P CABG x 4    02/ 2004  . Seizure disorder [facility]) last seizure 2011 due to confusion   neurologist-  dr Leta Baptist-- per note seizure documented 2008 breakthrough partial complex seizure (prior tonic-clonic seizure's post CVA)  . Type 2 diabetes, diet controlled ([place])     2  . Visual neglect    LEFT EYE    Past Surgical History:  Procedure Laterality Date  . CARDIAC CATHETERIZATION  11/17/2002   significant left main disease and 3 vessel CAD, mildly depressed LV systolic  fx, 08% left renal artery stenosis  . CAROTID ENDARTERECTOMY  12/1997   [facility])   Bilateral ICA  . CAROTID STENT INSERTION Bilateral dr berry and dr Kristine Royal--  (In-stent Restenosis)  LeftICA   02-12-2011;  [place]   02-26-2011  . CORONARY ARTERY BYPASS GRAFT  2/ 2004   dr hendrickson   x3 LIMA to LAD,free right internal mammary artery to obtuse marginal 1,SVG to distal right coronary.  . CYSTOSCOPY W/ RETROGRADES Bilateral  09/10/2015   Procedure: CYSTOSCOPY WITH RETROGRADE PYELOGRAM;  Surgeon: Carolan Clines, MD;  Location: [facility];  Service: Urology;  Laterality: Bilateral;  . CYSTOSCOPY WITH BIOPSY N/A 09/10/2015   Procedure: CYSTOSCOPY WITH BIOPSY,RIGHT TRIGONAL TUMOR 1.5 CM, EXCISIONAL BIOPSY WITH TAUBER FORCEP RIGHT POSTERIOR BLADDER WALL 1 CM, EXCISIONAL BIOPSY SATELITE BLADDER WALL 1 CM;  Surgeon: Carolan Clines, MD;  Location: [facility];  Service: Urology;  Laterality: N/A;  . CYSTOSCOPY WITH BIOPSY N/A 03/26/2017   Procedure: CYSTOSCOPY WITH BIOPSY/;  Surgeon: Carolan Clines, MD;  Location: [facility];  Service: Urology;  Laterality: N/A;  . CYSTOSCOPY WITH HYDRODISTENSION AND BIOPSY N/A 10/29/2015   Procedure: REPEAT CYSTOSCOPY BIOPSY BLADDER DEEP MUSCLES BLADDER BIOPSY;  Surgeon: Carolan Clines, MD;  Location:  [facility];  Service: Urology;  Laterality: N/A;  . CYSTOSCOPY WITH URETHRAL DILATATION N/A 09/10/2015   Procedure: CYSTOSCOPY WITH URETHRAL DILATATION;  Surgeon: Carolan Clines, MD;  Location: [facility];  Service: Urology;  Laterality: N/A;  . FULGURATION OF BLADDER TUMOR N/A 03/26/2017   Procedure: FULGURATION OF BLADDER TUMOR;  Surgeon: Carolan Clines, MD;  Location: [facility];  Service: Urology;  Laterality: N/A;  . [place]  . MOHS SURGERY  06-20-2015   side of nose  . NM MYOCAR PERF WALL MOTION  01/09/2011   dr berry   mild to mod. perfusion defect in the basal inferolateral & mid inferolaterl regions consistant with an infarct/scar, no signigicant ischemia demonstracted/  Low Risk scan, no sig. change from previous study/  normal LV function and wall motion , ef 63%  . ORIF RIGHT ANKLE FX  1998   hardware retained  . TRANSURETHRAL RESECTION OF BLADDER TUMOR N/A 09/10/2015   Procedure: CYSTO TRANSURETHRAL RESECTION OF BLADDER TUMOR (TURBT) OF LEFT POSTERIOR BLADDER TUMOR 2 CM;  Surgeon: Carolan Clines, MD;  [facility];  Service: Urology;  Laterality: N/A;  . TRANSURETHRAL RESECTION OF BLADDER TUMOR Left 04/17/2016   Procedure: TRANSURETHRAL RESECTION OF BLADDER TUMOR (TURBT);  Surgeon: Carolan Clines, MD;  Location: [facility];  Service: Urology;  Laterality: Left;  . US ECHOCARDIOGRAPHY  01/03/2011   normal LVF, ef>55%/  mild LAE/ trace MR and TR,  mild AV sclerosis without stenosis    There were no vitals filed for this visit.   Subjective Assessment - 04/08/18 0855    Subjective  Pt reports less mobility in the past year, since having additional CVA one year ago post surgery.  Feels like LUE is more heavy than it has been.  Feels like L knee hyperextension is worse.  Uses rollator with RUE only.  Parkinson's causes UE tremors, balance, forward posture issues.  No falls in the past 6 months.     Patient is accompained by:  Family member Ex-wife who is caregiver, Kennyth Lose    Patient Stated Goals  Pt's goal is to try to walk with a cane again-not sure if that's possible.    Currently in Pain?  No/denies         [facility] PT Assessment - 04/08/18 0901      Assessment   Medical Diagnosis  cerebral thrombosis s/p cerebral infarction, Parkinson's disease, spastic hemiplegia L side    Referring Provider  Naaman Plummer    Onset Date/Surgical Date  03/16/18    Hand Dominance  Right      Precautions   Precautions  Fall      Balance Screen   Has the patient fallen  in the past 6 months  No    Has the patient had a decrease in activity level because of a fear of falling?   Yes    Is the patient reluctant to leave their home because of a fear of falling?   No      Home Environment   Living Environment  Private residence    Living Arrangements  Other (Comment) Ex-wife is his caregiver    Available Help at Discharge  Family;Available 24 hours/day    Type of Home  House    Home Access  Stairs to enter    Entrance Stairs-Number of Steps  5    Entrance Stairs-Rails  Right;Left    Home Layout  One level    Home Equipment  Walker - 4 wheels;Grab bars - tub/shower;Grab bars - toilet;Cane - quad;Wheelchair - manual      Prior Function   Level of Independence  Independent with basic ADLs;Independent with household mobility with device    Vocation  Retired    Leisure  Enjoys concerts in the park, playing the Wii.  Did go to the [facility] for the pool (1 1/2 years ago)      Observation/Other Assessments   Focus on Therapeutic Outcomes (FOTO)   NA      Posture/Postural Control   Posture/Postural Control  Postural limitations    Postural Limitations  Rounded Shoulders;Forward head;Weight shift left Left posterior lean      ROM / Strength   AROM / PROM / Strength  Strength      Strength   Overall Strength Comments  Decreased trunk strength as pt leans left and posteriorly with MMT of LLE.  RLE hip  flexion, quads, hamstrings 4/5; R dorsiflexion 3+/5; L hip flexion 3-5, L quads and hamstrings 3-/5      Transfers   Transfers  Sit to Stand;Stand to Sit    Sit to Stand  5: Supervision;With upper extremity assist;From chair/3-in-1    Stand to Sit  5: Supervision;With upper extremity assist;To chair/3-in-1      Ambulation/Gait   Ambulation/Gait  Yes    Ambulation/Gait Assistance  4: Min guard    Ambulation Distance (Feet)  100 Feet    Assistive device  Rollator Drives rollator one handed with RUE only    Gait Pattern  Step-to pattern;Step-through pattern;Decreased step length - left;Decreased step length - right;Decreased stance time - left;Decreased hip/knee flexion - left;Decreased dorsiflexion - left;Decreased weight shift to left;Trunk rotated posteriorly on left    Ambulation Surface  Level;Indoor    Gait velocity  23.43 sec = 1.4 ft/sec    Gait Comments  Pt wears L AFO      Standardized Balance Assessment   Standardized Balance Assessment  Timed Up and Go Test;Berg Balance Test      Berg Balance Test   Sit to Stand  Able to stand  independently using hands    Standing Unsupported  Able to stand 30 seconds unsupported 1:14    Sitting with Back Unsupported but Feet Supported on Floor or Stool  Able to sit 30 seconds    Stand to Sit  Controls descent by using hands    Transfers  Able to transfer with verbal cueing and /or supervision    Standing Unsupported with Eyes Closed  Needs help to keep from falling    Standing Ubsupported with Feet Together  Needs help to attain position and unable to hold for 15 seconds    From Standing, Reach Forward with  Outstretched Arm  Loses balance while trying/requires external support    From Standing Position, Pick up Object from Floor  Unable to try/needs assist to keep balance    From Standing Position, Turn to Look Behind Over each Shoulder  Needs assist to keep from losing balance and falling    Turn 360 Degrees  Needs assistance while turning     Standing Unsupported, Alternately Place Feet on Step/Stool  Needs assistance to keep from falling or unable to try    Standing Unsupported, One Foot in [employer] balance while stepping or standing    Standing on One Leg  Unable to try or needs assist to prevent fall    Total Score  12    Berg comment:  Scores <45/56 indicate increased fall risk      Timed Up and Go Test   Normal TUG (seconds)  27.6    TUG Comments  TUG scores >13.5 seconds indicates increased fall risk                Objective measurements completed on examination: See above findings.                PT Short Term Goals - 04/09/18 1325      PT SHORT TERM GOAL #1   Title  Pt will perform HEP with supervision for improved balance and gait.  TARGET 04/30/18    Time  3    Period  Weeks    Status  New    Target Date  04/30/18      PT SHORT TERM GOAL #2   Title  Pt will improve Berg score to at least 17/56 for decreased fall risk.    Time  3    Period  Weeks    Status  New    Target Date  04/30/18      PT SHORT TERM GOAL #3   Title  Pt/caregiver will verbalize understanding of fall prevention in home environment.    Time  3    Period  Weeks    Status  New    Target Date  04/30/18        PT Long Term Goals - 04/09/18 1327      PT LONG TERM GOAL #1   Title  Pt will improve TUG score to less than or equal to 23 seconds for decreased fall risk.  TARGET 05/21/18    Time  6    Period  Weeks    Status  New    Target Date  05/21/18      PT LONG TERM GOAL #2   Title  Pt will improve Berg score to least 25/56 for decreased fall risk.    Time  6    Period  Weeks    Status  New    Target Date  05/21/18      PT LONG TERM GOAL #3   Title  Pt will ambulate at least 500 ft, using least restrictive assistive device, with supervision, for improved household and community mobility.    Time  6    Period  Weeks    Status  New    Target Date  05/21/18             Plan - 04/09/18 1317     Clinical Impression Statement  Pt is a 72 year old male with history of CVA in 1999, with recent CVA approximately one year ago, also with history of Parkinson's disease.  He has L sided hemiplegia following CVA, with reported increased weakness and decreased balance since most recent CVA.  He presents with decreased strength, decreased balance, decreased independence and safety with gait, abnormal posture; pt is at fall risk per gait velocity, TUG, and Berg scores.  Pt would benefit from skilled PT to address the above stated deficits to decrease fall risk and improve functional mobility.    History and Personal Factors relevant to plan of care:  PMH >3 co-morbidities including Parkinson's disease, CVA, CABG, seizure disorder    Clinical Presentation  Evolving    Clinical Presentation due to:  fall risk per TUG, Berg, 42m walk test; previously ambulated with cane    Clinical Decision Making  Moderate    Rehab Potential  Fair    PT Frequency  2x / week    PT Duration  Other (comment) 3 weeks, then 1x/wk for 3 weeks    PT Treatment/Interventions  ADLs/Self Care Home Management;DME Instruction;Balance training;Therapeutic exercise;Therapeutic activities;Gait training;Neuromuscular re-education;Functional mobility training;Patient/family education    PT Next Visit Plan  Initiate HEP for strengthening, balance, gait training; posture exercise    Recommended Other Services  Recommended OT services to pt/caregiver at eval    Consulted and Agree with Plan of Care  Patient;Family member/caregiver    Family Member Consulted  ex-wife who is caregiver       Patient will benefit from skilled therapeutic intervention in order to improve the following deficits and impairments:  Abnormal gait, Decreased balance, Decreased safety awareness, Decreased strength, Difficulty walking, Decreased mobility, Impaired tone, Postural dysfunction  Visit Diagnosis: Other abnormalities of gait and mobility  Unsteadiness  on feet  Muscle weakness (generalized)  Abnormal posture     Problem List Patient Active Problem List   Diagnosis Date Noted  . Essential hypertension 09/25/2015  . Parkinsonism ([place]) 04/19/2015  . Essential tremor 04/19/2015  . Parkinson's disease ([place]) 06/28/2014  . Coronary artery disease 03/24/2014  . Hyperlipidemia 03/24/2014  . Aftercare following surgery of the circulatory system, [place] 11/14/2013  . Chronic ischemic right MCA stroke 03/08/2013  . Seizure disorder ([place]) 03/08/2013  . Cerebral thrombosis with cerebral infarction ([place]) 03/29/2012  . Spastic hemiplegia affecting nondominant side ([place]) 03/29/2012  . Seizure ([place]) 03/29/2012  . Occlusion and stenosis of carotid artery without mention of cerebral infarction 10/20/2011    MARRIOTT,AMY W. 04/09/2018, 1:32 PM  [facility]raz[facility] Butt., PT [facility] [address] [place], [place], [address] Phone: [phone]   Fax:  [phone]  Name: [name] MRN: [id number] Date of Birth: [birth year]

## 2018-04-23 ENCOUNTER — Encounter

## 2018-04-25 ENCOUNTER — Other Ambulatory Visit: Payer: Self-pay | Admitting: Cardiovascular Disease

## 2018-04-26 ENCOUNTER — Ambulatory Visit: Payer: Medicare Other | Admitting: Physical Therapy

## 2018-04-26 NOTE — Telephone Encounter (Signed)
Rx sent to pharmacy   

## 2018-04-27 ENCOUNTER — Telehealth: Payer: Self-pay | Admitting: Cardiovascular Disease

## 2018-04-27 MED ORDER — OLMESARTAN MEDOXOMIL 20 MG PO TABS: 20.0000 mg | ORAL_TABLET | Freq: Every day | ORAL | 1 refills | Status: DC

## 2018-04-27 NOTE — Telephone Encounter (Signed)
Pt usually see Dr Gwenlyn Found once a year,saw him in December. When he got his medicine yesterday, it said his need an appointment before his next refill. So does pt need appt now or December? Marland Kitchen

## 2018-04-27 NOTE — Telephone Encounter (Signed)
Patient called and notified he is not due for MD OV until Dec 2019. Apologized that med was only filled for #60 with no refills and there was a note that he needed an appointment. Updated Rx for #90 with 1 refill. Offered to schedule appointment but patient will wait til closer to Dec

## 2018-04-28 ENCOUNTER — Ambulatory Visit: Payer: Medicare Other | Admitting: Physical Therapy

## 2018-05-04 ENCOUNTER — Ambulatory Visit: Payer: Medicare Other | Admitting: Physical Therapy

## 2018-05-05 ENCOUNTER — Telehealth: Payer: Self-pay | Admitting: Physical Medicine & Rehabilitation

## 2018-05-05 ENCOUNTER — Ambulatory Visit: Payer: Medicare Other | Admitting: Diagnostic Neuroimaging

## 2018-05-05 ENCOUNTER — Encounter: Payer: Self-pay | Admitting: Diagnostic Neuroimaging

## 2018-05-05 VITALS — BP 177/70 | HR 56 | Ht 64.0 in | Wt 154.6 lb

## 2018-05-05 DIAGNOSIS — G250 Essential tremor: Secondary | ICD-10-CM

## 2018-05-05 DIAGNOSIS — G40909 Epilepsy, unspecified, not intractable, without status epilepticus: Secondary | ICD-10-CM | POA: Diagnosis not present

## 2018-05-05 DIAGNOSIS — G20 Parkinson's disease: Secondary | ICD-10-CM

## 2018-05-05 DIAGNOSIS — I6930 Unspecified sequelae of cerebral infarction: Secondary | ICD-10-CM

## 2018-05-05 MED ORDER — DILANTIN 100 MG PO CAPS: 100.0000 mg | ORAL_CAPSULE | Freq: Three times a day (TID) | ORAL | 4 refills | Status: DC

## 2018-05-05 MED ORDER — CARBIDOPA-LEVODOPA 25-100 MG PO TABS: 2.0000 | ORAL_TABLET | Freq: Three times a day (TID) | ORAL | 4 refills | Status: DC

## 2018-05-05 MED ORDER — KEPPRA 500 MG PO TABS: 500.0000 mg | ORAL_TABLET | Freq: Two times a day (BID) | ORAL | 4 refills | Status: DC

## 2018-05-05 NOTE — Progress Notes (Signed)
[facility]  PATIENT: [name] DOB: 1946-10-07  REFERRING CLINICIAN:  HISTORY FROM: patient and wife REASON FOR VISIT: follow up   HISTORICAL  CHIEF COMPLAINT:  Chief Complaint  Patient presents with  . Follow-up  . stroke/ Parkinsonisms/ Sz    Stroke.  No seizures. Larm (PT) No doing due to cost.     HISTORY OF PRESENT ILLNESS:   UPDATE (05/05/18, VRP): Since last visit, doing wores with balance. Symptoms are progressive. Severity is moderate. No alleviating or aggravating factors. Tolerating meds.  No seizures.  UPDATE 05/04/17: Since last visit, had had confusion and weakness following bladder surgery, lasting 11 days. Now back to baseline. Taking carb/levo 2 tabs TID (2 tabs 7am, 1 tab 11:00, 1 tab 2:30pm, 1 tab 6:30pm, 1 tab 9pm).   UPDATE 11/03/16: Since last visit, voice is more hoarse. More trouble with blood sugar regulation. More falls. New rash on right face and left back.   UPDATE 04/22/16: Since last visit, postural tremor is slightly worse; resting tremor is stable. More freezing with gait initiation. No falls. Had 1 more bladder cancer surgery on 04/17/16.   UPDATE 10/23/15: Since last visit, overall stable; but unfortunately dx'd with bladder CA, s/p resection. Stable on meds. Tremor stable.   UPDATE 04/19/15: Since last visit, doing better. Less choking on saliva. Postural and action tremor stable. Balance stable. Mainly with postural and action tremor.   UPDATE 12/18/14: Since last visit, stable. On carb/levo half tab TID. May have had some improvement in tremor. Still using single point cane. No falls since last visit.   UPDATE 09/13/14: Since last visit, symptoms are stable. Still falling. More problems due to right sided slowness and chronic left hemiparesis. Asking about parkinson's dz meds, diagnosis, treatment, supplements.   UPDATE 03/08/14: Since last visit, no seizures. Has been falling more. Has noticed hand writing deterioration  over past 3 years. Also intermittent right hand tremor x 3-6 months (per wife). Patient noted some right hand tremor even in 1999.  UPDATE 03/08/13: Since last visit patient doing well. No seizures since 2005. Patient is stable on Dilantin and Keppra brand name medications. Patient's swallowing has improved since last visit. He make sure to take liquids following each mouthful of food.  PRIOR HPI (08/27/12, Dr. Erling Cruz): 72 year old right-handed white divorced male from [place], [place] who is cared for by his ex-first wife. He has a known history of hypertension, right MCA stroke 3/99 associated with residual left hemiparesis, acute right MCA stroke 09/2003,coronary artery disease status post 3 vessel bypass in 2004, bilateral carotid endarterectomies in 1999 and bilateral stent placement 5/12, recurrent TIAs versus seizures, documented seizure 11/12/2006, and Dilantin toxicity. His last MRI of the brain without contrast 11/13/2006 showed a large chronic right middle artery infarct involving the posterior temporal and parietal lobe and right thalamus. MR angiogram of the head without contrast showed intracranial atherosclerotic disease and occlusion of the right middle cerebral artery branch corresponding to the right MCA infarct. There was mild atherosclerotic vascular disease elsewhere and no aneurysm. Doppler study of the carotids 12/16/07 was negative for hemodynamically significant stenosis in the intracranial arteries.  EEG 11/13/2006 showed rhythmic discharges correlating with the EKG and were thought to be artifact. . His last seizure was 11/18/2006. He subsequently had one episode of confusion 11/26/2009 which might have been a seizure. He was at his son's house, came home, looked in the  driveway, and asked where his car was. His car had been given  to someone else in the family. He kept asking about the car and recognized that he was confused. The episode lasted 40 minutes. The patient lives with his  caretaker. He is able to bathe himself and  take care of his toileting needs, but requires assistance in dressing. He  exercises at the [facility] 2 days per week. He has falls with a fall assessment tool score of 10. He denies macropsia, micropsia, strange odors or tastes. His caretaker reports that at times he has left knee hyperextension. He uses a left leg brace. He denies TIA symptoms or warnings of seizures.He has finished 2 months of PT in no rehabilitation and has a new left foot brace, which is not associated with hyperextension of his knee. He denies spasticity or spasms. He enjoys playing "Wii" 2 times per day for exercise.His wife reports that he chokes on his saliva. He has not had  unexplained fever or chills. Last trough levetiracetam level 15.9  and phenytoin level 13.4 on 03/11/2012.  REVIEW OF SYSTEMS: Full 14 system review of systems performed and negative except: only as per HPI.     ALLERGIES: Allergies  Allergen Reactions  . Altace [Ramipril] Cough    HOME MEDICATIONS: Outpatient Medications Prior to Visit  Medication Sig Dispense Refill  . BCG vaccine 81 mg in sodium chloride 0.9 % 50 mL Instill 81 mg into the bladder once a week.    . carbidopa-levodopa (SINEMET IR) 25-100 MG tablet Take 2 tablets by mouth 3 (three) times daily. 540 tablet 4  . cholecalciferol (VITAMIN D) 1000 units tablet Take 2,000 Units by mouth daily with breakfast.     . Coenzyme Q10 (COQ-10) 50 MG CAPS Take by mouth 2 (two) times daily.     . dantrolene (DANTRIUM) 50 MG capsule Take 1 capsule (50 mg total) by mouth 2 (two) times daily. 180 capsule 3  . DILANTIN 100 MG ER capsule Take 1 capsule (100 mg total) by mouth 3 (three) times daily. 270 capsule 4  . dipyridamole-aspirin (AGGRENOX) 200-25 MG 12hr capsule Take 1 capsule by mouth 2 (two) times daily. 180 capsule 4  . ezetimibe (ZETIA) 10 MG tablet TAKE ONE TABLET (10MG ) BY MOUTH DAILY 90 tablet 3  . fenofibrate (TRICOR) 48 MG tablet TAKE 1 TABLET BY  MOUTH DAILY 90 tablet 3  . Flaxseed, Linseed, 1000 MG CAPS Take 1,000 mg by mouth every evening.     Marland Kitchen FOLBIC 2.5-25-2 MG TABS tablet TAKE 1 TABLET DAILY WITH FOOD 90 tablet 2  . insulin lispro (HUMALOG KWIKPEN) 100 UNIT/ML KiwkPen Inject 4 Units into the skin See admin instructions. Takes 3 times daily before meals for sugar over 200 only    . KEPPRA 500 MG tablet Take 1 tablet (500 mg total) by mouth 2 (two) times daily. 180 tablet 4  . metoprolol tartrate (LOPRESSOR) 25 MG tablet TAKE ONE TABLET BY MOUTH DAILY (KEEP OFFICE VISIT) 90 tablet 3  . nitroGLYCERIN (NITROSTAT) 0.4 MG SL tablet Place 1 tablet (0.4 mg total) under the tongue every 5 (five) minutes as needed for chest pain. 25 tablet 3  . olmesartan (BENICAR) 20 MG tablet Take 1 tablet (20 mg total) by mouth daily. 90 tablet 1  . ONETOUCH DELICA LANCETS 68T MISC     . ONETOUCH VERIO test strip     . RAPAFLO 8 MG CAPS capsule Take 8 mg by mouth daily with breakfast.     . rosuvastatin (CRESTOR) 20 MG tablet Take 20 mg by mouth  daily.    . B Complex-C-Folic Acid (HM VITAMIN B COMPLEX/VITAMIN C) TABS Take by mouth.     No facility-administered medications prior to visit.     PAST MEDICAL HISTORY: Past Medical History:  Diagnosis Date  . At high risk for falls   . Bladder cancer [facility])    urologist-  dr Gaynelle Arabian  . CAD (coronary artery disease) cardiologist-  dr berry  . Cerebrovascular arteriosclerosis   . Gait disturbance, post-stroke    uses cane, rollator, and w/c long distance  . Hemiparkinsonism [facility]) neurologist-- dr Leta Baptist   right side body  . History of basal cell carcinoma excision    Sept 2016--  MOH's surgery side of nose  . History of carotid artery stenosis cardiologist-- dr berry   bilateral --  1999 s/p  bilateral ICA endarterectomy and recurrent restenosis 2012  s/p  staged bilateral stenting   per last duplex 2015  stents widely patent  . History of CVA with residual deficit neurologist-  dr Leta Baptist    3/ 1999  Right MCA  and  12/ 2004  Anterior division of  Right MCA ---  residual left spactic hemiparesis and left foot drop  . History of MI (myocardial infarction)    02/ 2004   s/p  cabg   . HOH (hard of hearing)    LEFT EAR  POST cva  . Hyperextension deformity of left knee    wears brace  . Hyperlipidemia   . Hypertension   . Left foot drop    residual from CVA  -- wears brace  . Left spastic hemiparesis ([place])    residual CVA 1999  . Pancreatic pseudocyst   . Peripheral arterial disease ([place])   . S/P CABG x 4    02/ 2004  . Seizure disorder [facility]) last seizure 2011 due to confusion   neurologist-  dr Leta Baptist-- per note seizure documented 2008 breakthrough partial complex seizure (prior tonic-clonic seizure's post CVA)  . Type 2 diabetes, diet controlled ([place])     2  . Visual neglect    LEFT EYE    PAST SURGICAL HISTORY: Past Surgical History:  Procedure Laterality Date  . CARDIAC CATHETERIZATION  11/17/2002   significant left main disease and 3 vessel CAD, mildly depressed LV systolic  fx, 31% left renal artery stenosis  . CAROTID ENDARTERECTOMY  12/1997   [facility])   Bilateral ICA  . CAROTID STENT INSERTION Bilateral dr berry and dr Kristine Royal--  (In-stent Restenosis)  LeftICA   02-12-2011;  [place]   02-26-2011  . CORONARY ARTERY BYPASS GRAFT  2/ 2004   dr hendrickson   x3 LIMA to LAD,free right internal mammary artery to obtuse marginal 1,SVG to distal right coronary.  . CYSTOSCOPY W/ RETROGRADES Bilateral 09/10/2015   Procedure: CYSTOSCOPY WITH RETROGRADE PYELOGRAM;  Surgeon: Carolan Clines, MD;  Location: [facility];  Service: Urology;  Laterality: Bilateral;  . CYSTOSCOPY WITH BIOPSY N/A 09/10/2015   Procedure: CYSTOSCOPY WITH BIOPSY,RIGHT TRIGONAL TUMOR 1.5 CM, EXCISIONAL BIOPSY WITH TAUBER FORCEP RIGHT POSTERIOR BLADDER WALL 1 CM, EXCISIONAL BIOPSY SATELITE BLADDER WALL 1 CM;  Surgeon: Carolan Clines, MD;  Location: [facility];  Service: Urology;  Laterality: N/A;  . CYSTOSCOPY WITH BIOPSY N/A 03/26/2017   Procedure: CYSTOSCOPY WITH BIOPSY/;  Surgeon: Carolan Clines, MD;  Location: [facility];  Service: Urology;  Laterality: N/A;  . CYSTOSCOPY WITH HYDRODISTENSION AND BIOPSY N/A 10/29/2015   Procedure: REPEAT CYSTOSCOPY BIOPSY BLADDER DEEP MUSCLES  BLADDER BIOPSY;  Surgeon: Carolan Clines, MD;  Location: [facility];  Service: Urology;  Laterality: N/A;  . CYSTOSCOPY WITH URETHRAL DILATATION N/A 09/10/2015   Procedure: CYSTOSCOPY WITH URETHRAL DILATATION;  Surgeon: Carolan Clines, MD;  Location: [place];  Service: Urology;  Laterality: N/A;  . FULGURATION OF BLADDER TUMOR N/A 03/26/2017   Procedure: FULGURATION OF BLADDER TUMOR;  Surgeon: Carolan Clines, MD;  Location: [facility];  Service: Urology;  Laterality: N/A;  . [place]  . MOHS SURGERY  06-20-2015   side of nose  . NM MYOCAR PERF WALL MOTION  01/09/2011   dr berry   mild to mod. perfusion defect in the basal inferolateral & mid inferolaterl regions consistant with an infarct/scar, no signigicant ischemia demonstracted/  Low Risk scan, no sig. change from previous study/  normal LV function and wall motion , ef 63%  . ORIF RIGHT ANKLE FX  1998   hardware retained  . TRANSURETHRAL RESECTION OF BLADDER TUMOR N/A 09/10/2015   Procedure: CYSTO TRANSURETHRAL RESECTION OF BLADDER TUMOR (TURBT) OF LEFT POSTERIOR BLADDER TUMOR 2 CM;  Surgeon: Carolan Clines, MD;  Location: [facility];  Service: Urology;  Laterality: N/A;  . TRANSURETHRAL RESECTION OF BLADDER TUMOR Left 04/17/2016   Procedure: TRANSURETHRAL RESECTION OF BLADDER TUMOR (TURBT);  Surgeon: Carolan Clines, MD;  [facility];  Service: Urology;  Laterality: Left;  . US ECHOCARDIOGRAPHY  01/03/2011   normal LVF, ef>55%/  mild LAE/ trace MR and TR,  mild AV sclerosis without stenosis    FAMILY HISTORY: Family  History  Problem Relation Age of Onset  . Stroke Father   . Heart attack Father   . Stroke Mother   . Diabetes Brother   . Hyperlipidemia Brother   . Hypertension Brother   . Hyperlipidemia Sister   . Hypertension Sister     SOCIAL HISTORY:  Social History   Socioeconomic History  . Marital status: Divorced    Spouse name: Not on file  . Number of children: 4  . Years of education: HS  . Highest education level: Not on file  Occupational History    Employer: RETIRED  Social Needs  . Financial resource strain: Not on file  . Food insecurity:    Worry: Not on file    Inability: Not on file  . Transportation needs:    Medical: Not on file    Non-medical: Not on file  Tobacco Use  . Smoking status: Former Smoker    Years: 5.00    Types: Cigarettes    Last attempt to quit: 11/14/2007    Years since quitting: 10.4  . Smokeless tobacco: Never Used  Substance and Sexual Activity  . Alcohol use: No  . Drug use: No  . Sexual activity: Not on file  Lifestyle  . Physical activity:    Days per week: Not on file    Minutes per session: Not on file  . Stress: Not on file  Relationships  . Social connections:    Talks on phone: Not on file    Gets together: Not on file    Attends religious service: Not on file    Active member of club or organization: Not on file    Attends meetings of clubs or organizations: Not on file    Relationship status: Not on file  . Intimate partner violence:    Fear of current or ex partner: Not on file    Emotionally abused: Not  on file    Physically abused: Not on file    Forced sexual activity: Not on file  Other Topics Concern  . Not on file  Social History Narrative   Patient lives at home with his caregiver/ex-wife.   Caffeine- 2 cups of coffee daily     PHYSICAL EXAM  GENERAL EXAM/CONSTITUTIONAL: Vitals:  Vitals:   05/05/18 1119  BP: (!) 177/70  Pulse: (!) 56  Weight: 154 lb 9.6 oz (70.1 kg)  Height: 5\' 4"  (1.626 m)      Body mass index is 26.54 kg/m. Wt Readings from Last 3 Encounters:  05/05/18 154 lb 9.6 oz (70.1 kg)  04/06/18 147 lb 1.6 oz (66.7 kg)  03/16/18 146 lb (66.2 kg)     Patient is in no distress; well developed, nourished and groomed; neck is supple  CARDIOVASCULAR:  Examination of carotid arteries is normal; no carotid bruits  Regular rate and rhythm, no murmurs  Examination of peripheral vascular system by observation and palpation is normal  EYES:  Ophthalmoscopic exam of optic discs and posterior segments is normal; no papilledema or hemorrhages  No exam data present  MUSCULOSKELETAL:  Gait, strength, tone, movements noted in Neurologic exam below  NEUROLOGIC: MENTAL STATUS:  MMSE - Mini Mental State Exam 05/04/2017  Orientation to time 4  Orientation to Place 5  Registration 3  Attention/ Calculation 3  Recall 3  Language- name 2 objects 2  Language- repeat 1  Language- follow 3 step command 3  Language- read & follow direction 1  Write a sentence 0  Copy design 1  Total score 26    awake, alert, oriented to person, place and time  recent and remote memory intact  normal attention and concentration  language fluent, comprehension intact, naming intact  fund of knowledge appropriate  CRANIAL NERVE:   2nd - no papilledema on fundoscopic exam  2nd, 3rd, 4th, 6th - pupils equal and reactive to light, visual fields full to confrontation, extraocular muscles intact, no nystagmus  5th - facial sensation symmetric  7th - facial strength symmetric; EXCEPT DECR LEFT LOWER FACIAL STRENGTH.   8th - hearing intact  9th - palate elevates symmetrically, uvula midline  11th - shoulder shrug symmetric  12th - tongue protrusion midline  MASKED FACIES  MILD DYSARTHRIA  MOTOR:   INCR TONE IN LUE AND LLE; LUE 1-2 PROX AND 0 DISTAL; LLE 3/5 PROX WITH FOOT DROP; RUE RARE REST TREMOR AND INCR TONE  SENSORY:   DECR ON LEFT SIDE  COORDINATION:    finger-nose-finger, fine finger movements SLOW  REFLEXES:   BRISK IN LUE AND LLE  GAIT/STATION:   IN WHEEL CHAIR; CANNOT STAND UNASSISTED      DIAGNOSTIC DATA (LABS, IMAGING, TESTING) - I reviewed patient records, labs, notes, testing and imaging myself where available.  Lab Results  Component Value Date   WBC 6.8 03/04/2018   HGB 13.0 03/04/2018   HCT 38.2 03/04/2018   MCV 101.5 (H) 03/04/2018   PLT 179 03/04/2018      Component Value Date/Time   NA 139 03/04/2018 1434   K 5.0 03/04/2018 1434   CL 106 03/04/2018 1434   CO2 28 03/04/2018 1434   GLUCOSE 105 03/04/2018 1434   BUN 36 (H) 03/04/2018 1434   CREATININE 1.27 03/04/2018 1434   CREATININE 1.11 07/13/2013 0814   CALCIUM 9.8 03/04/2018 1434   PROT 7.9 03/04/2018 1434   PROT 6.9 10/08/2017 0820   ALBUMIN 4.4 03/04/2018 1434  ALBUMIN 4.4 10/08/2017 0820   AST 22 03/04/2018 1434   ALT 6 03/04/2018 1434   ALKPHOS 60 03/04/2018 1434   BILITOT <0.2 (L) 03/04/2018 1434   GFRNONAA 54 (L) 03/04/2018 1434   GFRAA >60 03/04/2018 1434   Lab Results  Component Value Date   CHOL 137 10/08/2017   HDL 40 10/08/2017   LDLCALC 67 10/08/2017   LDLDIRECT 78 10/22/2016   TRIG 149 10/08/2017   CHOLHDL 3.4 10/08/2017   Lab Results  Component Value Date   HGBA1C 6.4 (H) 03/23/2017   Lab Results  Component Value Date   VITAMINB12 2,517 (H) 03/04/2018   Lab Results  Component Value Date   TSH 3.060 03/04/2018    11/14/13 Carotid u/s - mild right CCA stenosis (< 50%) - patent bilateral carotid stents  10/19/14 MRI BRAIN 1. Chronic right hemispheric ischemic infarction. 2. Mild chronic small vessel ischemic disease. 3. No acute findings. No change from MRI on 11/13/06.     ASSESSMENT AND PLAN  72 y.o. year old male  has a past medical history of At high risk for falls, Bladder cancer ([place]), CAD (coronary artery disease) (cardiologist-  dr berry), Cerebrovascular arteriosclerosis, Gait disturbance,  post-stroke, Hemiparkinsonism ([place]) (neurologist-- dr Leta Baptist), History of basal cell carcinoma excision, History of carotid artery stenosis (cardiologist-- dr berry), History of CVA with residual deficit (neurologist-  dr Leta Baptist), History of MI (myocardial infarction), HOH (hard of hearing), Hyperextension deformity of left knee, Hyperlipidemia, Hypertension, Left foot drop, Left spastic hemiparesis ([place]), Pancreatic pseudocyst, Peripheral arterial disease ([place]), S/P CABG x 4, Seizure disorder ([place]) (last seizure 2011 due to confusion), Type 2 diabetes, diet controlled ([place]), and Visual neglect. here with history of right MCA stroke (1999) and seizure disorder. Patient is doing well on brand name Keppra and Dilantin. Now with right body hemiparkinsonism since 2013. May also have had essential tremor since the 1960's (in retrospect).    Dx:  Parkinsonism, unspecified Parkinsonism type ([place])  Chronic ischemic right MCA stroke  Seizure disorder ([place])  Essential tremor     PLAN:  POST STROKE SEIZURE DISORDER (last seizure 2009) - stable; continue dilantin and keppra; brand medically necessary - consider reducing dilantin to reduce polypharmacy; and also to reduce macrocytosis (dx'd by heme/onc, and possible med side effect); could reduce 1 tab per day every month  PARKINSONISM - slight worsening of posture - continue carb/levo (2 tabs 7am, 1 tab 2:30pm, 2 tab 6:30pm, 1 tab 9pm); may consider trial of tapering off to see if still beneficial, since patient and wife not sure if it is helping or not; could reduce 1 tab per day every week  ESSENTIAL TREMOR - stable; monitor for now --> could consider primidone in future for essential tremor, but I am reluctant due to polypharmacy  FALL RISK - worsening; high fall risk due to combination of chronic right MCA stroke and right body hemiparkinsonism; use walker and wheelchair  Meds ordered this encounter  Medications  . DILANTIN 100 MG ER  capsule    Sig: Take 1 capsule (100 mg total) by mouth 3 (three) times daily.    Dispense:  270 capsule    Refill:  4  . KEPPRA 500 MG tablet    Sig: Take 1 tablet (500 mg total) by mouth 2 (two) times daily.    Dispense:  180 tablet    Refill:  4  . carbidopa-levodopa (SINEMET IR) 25-100 MG tablet    Sig: Take 2 tablets by mouth 3 (three) times  daily.    Dispense:  540 tablet    Refill:  4   Return in about 1 year (around 05/06/2019) for with NP.     Penni Bombard, MD 8/87/5797, 28:20 AM Certified in Neurology, Neurophysiology and [facility] [address] [place], [place] [address] [phone]

## 2018-05-05 NOTE — Telephone Encounter (Signed)
DR. Naaman Plummer ptn wife called and wanted you to know that they went to the PT - for therapy and didn't see the benefit and it was very cost prohibitive.  She cancelled you September appointment because she believes that was to let you know how PT was progressing.  She wants to keep her yearly appointment with you..... Said PT seemed more interested in the post stroke vs. Parkinsons' and it was not helpful enough to warrant the charges which she could not afford.

## 2018-05-06 ENCOUNTER — Other Ambulatory Visit: Payer: Self-pay | Admitting: Diagnostic Neuroimaging

## 2018-05-07 ENCOUNTER — Ambulatory Visit: Payer: Medicare Other | Admitting: Physical Therapy

## 2018-05-10 ENCOUNTER — Ambulatory Visit: Payer: Medicare Other | Admitting: Physical Therapy

## 2018-05-12 ENCOUNTER — Ambulatory Visit: Payer: Medicare Other | Admitting: Physical Therapy

## 2018-05-19 ENCOUNTER — Ambulatory Visit: Payer: Medicare Other | Admitting: Physical Therapy

## 2018-05-26 ENCOUNTER — Ambulatory Visit: Payer: Medicare Other | Admitting: Physical Therapy

## 2018-06-02 ENCOUNTER — Ambulatory Visit: Payer: Medicare Other | Admitting: Physical Therapy

## 2018-06-21 ENCOUNTER — Other Ambulatory Visit: Payer: Self-pay | Admitting: Diagnostic Neuroimaging

## 2018-06-23 ENCOUNTER — Encounter: Payer: Medicare Other | Admitting: Physical Medicine & Rehabilitation

## 2018-07-01 ENCOUNTER — Ambulatory Visit: Payer: Medicare Other | Admitting: Hematology

## 2018-07-01 ENCOUNTER — Other Ambulatory Visit: Payer: Medicare Other

## 2018-08-10 ENCOUNTER — Telehealth: Payer: Self-pay | Admitting: Cardiovascular Disease

## 2018-08-10 MED ORDER — OLMESARTAN MEDOXOMIL 40 MG PO TABS: 40.0000 mg | ORAL_TABLET | Freq: Every day | ORAL | Status: DC

## 2018-08-10 NOTE — Telephone Encounter (Signed)
Spoke with pt wife. She called to report the pt's elevated BP readings. She readings below. She sts that she is not sure what his HR was at his o/v yesterday. She repeated it once the returned home in the afternoon. It was 173/79 59bpm, 173/70 58bpm. He takes his Metoprolol tartrate 25mg  only once daily at bedtime. (He was told by Dr.Berry to take it at bedtime to help reduce his symptoms of dizziness). He take his Olmesartan 20mg  at 2pm daily. Pt BP this morning was 185/74. Pt recently had bladder sx and recently completed bladder bcg therapy and has been with voiding. ( she sts that this may be a contributing factor). Denies any other symptoms. She wonders if he needs to have his medications increased. Adv pt wife I will fwd a message to [facility] and our Pharm-D to adv. We will call back with their response.

## 2018-08-10 NOTE — Telephone Encounter (Signed)
Pt c/o BP issue: STAT if pt c/o blurred vision, one-sided weakness or slurred speech  1. What are your last 5 BP readings?Yesterday morning it was 209/97 at the doctor's office-yesterday afternoon it was 173/79 and today it was 173/70 and 185/74 2. Are you having any other symptoms (ex. Dizziness, headache, blurred vision, passed out)? no  3. What is your BP issue?  High Blood Pressure - wife wants to know if she should increase his blood pressure medicine?

## 2018-08-10 NOTE — Telephone Encounter (Signed)
Have him increase olmesartan to 40 mg daily and he will need a BMET when he sees [place] in November (about 10 days from now)

## 2018-08-10 NOTE — Telephone Encounter (Signed)
Spoke with pt wife. She is aware of Pharm-D's recommendation to increase Olmesartan to 40mg  daily, pt is to have a bmet when he see [facility] in 10 days. Adv her to continue to monitor the pt BP. If it remains consistently elevated or if symptoms develop she is to call the office to give an update.

## 2018-08-18 ENCOUNTER — Encounter: Payer: Medicare Other | Attending: Physical Medicine & Rehabilitation | Admitting: Physical Medicine & Rehabilitation

## 2018-08-18 ENCOUNTER — Ambulatory Visit: Payer: Medicare Other | Admitting: Cardiology

## 2018-08-18 ENCOUNTER — Encounter: Payer: Self-pay | Admitting: Cardiology

## 2018-08-18 ENCOUNTER — Encounter: Payer: Self-pay | Admitting: Physical Medicine & Rehabilitation

## 2018-08-18 VITALS — BP 168/72 | HR 58 | Resp 16 | Ht 64.0 in | Wt 152.0 lb

## 2018-08-18 VITALS — BP 162/78 | HR 57 | Ht 64.0 in | Wt 147.0 lb

## 2018-08-18 DIAGNOSIS — I252 Old myocardial infarction: Secondary | ICD-10-CM | POA: Diagnosis not present

## 2018-08-18 DIAGNOSIS — I69354 Hemiplegia and hemiparesis following cerebral infarction affecting left non-dominant side: Secondary | ICD-10-CM | POA: Insufficient documentation

## 2018-08-18 DIAGNOSIS — Z79899 Other long term (current) drug therapy: Secondary | ICD-10-CM | POA: Insufficient documentation

## 2018-08-18 DIAGNOSIS — I10 Essential (primary) hypertension: Secondary | ICD-10-CM | POA: Insufficient documentation

## 2018-08-18 DIAGNOSIS — Z951 Presence of aortocoronary bypass graft: Secondary | ICD-10-CM

## 2018-08-18 DIAGNOSIS — G40909 Epilepsy, unspecified, not intractable, without status epilepticus: Secondary | ICD-10-CM | POA: Insufficient documentation

## 2018-08-18 DIAGNOSIS — G20 Parkinson's disease: Secondary | ICD-10-CM | POA: Diagnosis not present

## 2018-08-18 DIAGNOSIS — R32 Unspecified urinary incontinence: Secondary | ICD-10-CM | POA: Insufficient documentation

## 2018-08-18 DIAGNOSIS — Z8673 Personal history of transient ischemic attack (TIA), and cerebral infarction without residual deficits: Secondary | ICD-10-CM

## 2018-08-18 DIAGNOSIS — N289 Disorder of kidney and ureter, unspecified: Secondary | ICD-10-CM | POA: Diagnosis not present

## 2018-08-18 DIAGNOSIS — C679 Malignant neoplasm of bladder, unspecified: Secondary | ICD-10-CM | POA: Insufficient documentation

## 2018-08-18 DIAGNOSIS — E785 Hyperlipidemia, unspecified: Secondary | ICD-10-CM | POA: Diagnosis not present

## 2018-08-18 DIAGNOSIS — I2510 Atherosclerotic heart disease of native coronary artery without angina pectoris: Secondary | ICD-10-CM | POA: Diagnosis not present

## 2018-08-18 DIAGNOSIS — R350 Frequency of micturition: Secondary | ICD-10-CM | POA: Diagnosis not present

## 2018-08-18 DIAGNOSIS — G8110 Spastic hemiplegia affecting unspecified side: Secondary | ICD-10-CM | POA: Diagnosis not present

## 2018-08-18 DIAGNOSIS — Z8249 Family history of ischemic heart disease and other diseases of the circulatory system: Secondary | ICD-10-CM | POA: Diagnosis not present

## 2018-08-18 DIAGNOSIS — R262 Difficulty in walking, not elsewhere classified: Secondary | ICD-10-CM | POA: Insufficient documentation

## 2018-08-18 DIAGNOSIS — Z87891 Personal history of nicotine dependence: Secondary | ICD-10-CM | POA: Diagnosis not present

## 2018-08-18 DIAGNOSIS — I6330 Cerebral infarction due to thrombosis of unspecified cerebral artery: Secondary | ICD-10-CM

## 2018-08-18 DIAGNOSIS — G218 Other secondary parkinsonism: Secondary | ICD-10-CM

## 2018-08-18 DIAGNOSIS — E1151 Type 2 diabetes mellitus with diabetic peripheral angiopathy without gangrene: Secondary | ICD-10-CM | POA: Insufficient documentation

## 2018-08-18 DIAGNOSIS — I6523 Occlusion and stenosis of bilateral carotid arteries: Secondary | ICD-10-CM

## 2018-08-18 MED ORDER — OLMESARTAN MEDOXOMIL 40 MG PO TABS: 40.0000 mg | ORAL_TABLET | Freq: Every day | ORAL | Status: DC

## 2018-08-18 MED ORDER — AMLODIPINE BESYLATE 5 MG PO TABS: 5.0000 mg | ORAL_TABLET | Freq: Every day | ORAL | 3 refills | Status: DC

## 2018-08-18 NOTE — Assessment & Plan Note (Signed)
CABG 2004, Myoview low risk 2012 and echo showed normal LVF 2012 

## 2018-08-18 NOTE — Progress Notes (Addendum)
08/18/2018 [name]   08-25-1946  [id number]  Primary Physician [name], MD Primary Cardiologist: Dr Gwenlyn Found  HPI: Mr. Luecke is a 72 year old male followed by Dr. Gwenlyn Found.  He has a history of coronary disease, vascular disease, HTN, and Parkinson's disease.  He had remote bypass grafting in 2004.  Myoview in 2012 was low risk and an echocardiogram in 2012 showed normal LV function.  He has not had problems with angina or heart failure.  He has carotid artery disease and had remote bilateral carotid endarterectomy in 1999.  He developed restenosis in 2012 and underwent staged carotid stenting by Dr. Gwenlyn Found and Dr. Trula Slade.  The patient has Parkinson's disease and has had a remote stroke with left sided paralysis.  He is followed by Dr. Stark Klein.  He has a seizure disorder and a few months ago was tapered off Dilantin as he has not had a seizure in some years.  Patient is here today with his ex-wife who is his primary caregiver.  Patient is wheelchair-bound.  She says that since the Dilantin was stopped she has noticed his blood pressure has drifted up.  Earlier this week she got readings of 621 systolic.  She became alarmed and called the office.  Our office suggested she increase the patient's Benicar from 20 mg a day to 40 mg a day and made this appointment.   Patient is seen in the office for further evaluation.  He is in a wheelchair in no distress.  He denies any chest pain, his ex-wife says he never complains of pain.  He has not had unusual dyspnea.  Repeat blood pressure by me in the office was 180/72.  Apparently he has been getting his Benicar at noon and his metoprolol 25 mg at bedtime.   Current Outpatient Medications  Medication Sig Dispense Refill  . carbidopa-levodopa (SINEMET IR) 25-100 MG tablet Take 2 tablets by mouth 3 (three) times daily. 540 tablet 4  . cholecalciferol (VITAMIN D) 1000 units tablet Take 2,000 Units by mouth daily with breakfast.     . Coenzyme  Q10 (COQ-10) 50 MG CAPS Take by mouth 2 (two) times daily.     . dantrolene (DANTRIUM) 50 MG capsule Take 1 capsule (50 mg total) by mouth 2 (two) times daily. 180 capsule 3  . dipyridamole-aspirin (AGGRENOX) 200-25 MG 12hr capsule TAKE ONE CAPSULE BY MOUTH TWICE A DAY 180 capsule 3  . ezetimibe (ZETIA) 10 MG tablet TAKE ONE TABLET (10MG ) BY MOUTH DAILY 90 tablet 3  . fenofibrate (TRICOR) 48 MG tablet TAKE 1 TABLET BY MOUTH DAILY 90 tablet 3  . Flaxseed, Linseed, 1000 MG CAPS Take 1,000 mg by mouth every evening.     Marland Kitchen FOLBIC 2.5-25-2 MG TABS tablet TAKE 1 TABLET DAILY WITH FOOD 90 tablet 1  . insulin lispro (HUMALOG KWIKPEN) 100 UNIT/ML KiwkPen Inject 4 Units into the skin See admin instructions. Takes 3 times daily before meals for sugar over 200 only    . KEPPRA 500 MG tablet Take 1 tablet (500 mg total) by mouth 2 (two) times daily. 180 tablet 4  . metoprolol tartrate (LOPRESSOR) 25 MG tablet TAKE ONE TABLET BY MOUTH DAILY (KEEP OFFICE VISIT) 90 tablet 3  . nitroGLYCERIN (NITROSTAT) 0.4 MG SL tablet Place 1 tablet (0.4 mg total) under the tongue every 5 (five) minutes as needed for chest pain. 25 tablet 3  . ONETOUCH DELICA LANCETS 30Q MISC     . ONETOUCH VERIO test strip     .  RAPAFLO 8 MG CAPS capsule Take 8 mg by mouth daily with breakfast.     . rosuvastatin (CRESTOR) 20 MG tablet Take 20 mg by mouth daily.    Marland Kitchen amLODipine (NORVASC) 5 MG tablet Take 1 tablet (5 mg total) by mouth daily. 180 tablet 3   No current facility-administered medications for this visit.     Allergies  Allergen Reactions  . Altace [Ramipril] Cough    Past Medical History:  Diagnosis Date  . At high risk for falls   . Bladder cancer [facility])    urologist-  dr Gaynelle Arabian  . CAD (coronary artery disease) cardiologist-  dr berry  . Cerebrovascular arteriosclerosis   . Gait disturbance, post-stroke    uses cane, rollator, and w/c long distance  . Hemiparkinsonism [facility]) neurologist-- dr Leta Baptist   right side  body  . History of basal cell carcinoma excision    Sept 2016--  MOH's surgery side of nose  . History of carotid artery stenosis cardiologist-- dr berry   bilateral --  1999 s/p  bilateral ICA endarterectomy and recurrent restenosis 2012  s/p  staged bilateral stenting   per last duplex 2015  stents widely patent  . History of CVA with residual deficit neurologist-  dr Leta Baptist   3/ 1999  Right MCA  and  12/ 2004  Anterior division of  Right MCA ---  residual left spactic hemiparesis and left foot drop  . History of MI (myocardial infarction)    02/ 2004   s/p  cabg   . HOH (hard of hearing)    LEFT EAR  POST cva  . Hyperextension deformity of left knee    wears brace  . Hyperlipidemia   . Hypertension   . Left foot drop    residual from CVA  -- wears brace  . Left spastic hemiparesis ([place])    residual CVA 1999  . Pancreatic pseudocyst   . Peripheral arterial disease ([place])   . S/P CABG x 4    02/ 2004  . Seizure disorder [facility]) last seizure 2011 due to confusion   neurologist-  dr Leta Baptist-- per note seizure documented 2008 breakthrough partial complex seizure (prior tonic-clonic seizure's post CVA)  . Type 2 diabetes, diet controlled ([place])     2  . Visual neglect    LEFT EYE    Social History   Socioeconomic History  . Marital status: Divorced    Spouse name: Not on file  . Number of children: 4  . Years of education: HS  . Highest education level: Not on file  Occupational History    Employer: RETIRED  Social Needs  . Financial resource strain: Not on file  . Food insecurity:    Worry: Not on file    Inability: Not on file  . Transportation needs:    Medical: Not on file    Non-medical: Not on file  Tobacco Use  . Smoking status: Former Smoker    Years: 5.00    Types: Cigarettes    Last attempt to quit: 11/14/2007    Years since quitting: 10.7  . Smokeless tobacco: Never Used  Substance and Sexual Activity  . Alcohol use: No  . Drug use: No  . Sexual  activity: Not on file  Lifestyle  . Physical activity:    Days per week: Not on file    Minutes per session: Not on file  . Stress: Not on file  Relationships  . Social connections:    Talks  on phone: Not on file    Gets together: Not on file    Attends religious service: Not on file    Active member of club or organization: Not on file    Attends meetings of clubs or organizations: Not on file    Relationship status: Not on file  . Intimate partner violence:    Fear of current or ex partner: Not on file    Emotionally abused: Not on file    Physically abused: Not on file    Forced sexual activity: Not on file  Other Topics Concern  . Not on file  Social History Narrative   Patient lives at home with his caregiver/ex-wife.   Caffeine- 2 cups of coffee daily     Family History  Problem Relation Age of Onset  . Stroke Father   . Heart attack Father   . Stroke Mother   . Diabetes Brother   . Hyperlipidemia Brother   . Hypertension Brother   . Hyperlipidemia Sister   . Hypertension Sister      Review of Systems: General: negative for chills, fever, night sweats or weight changes.  Cardiovascular: negative for chest pain, dyspnea on exertion, edema, orthopnea, palpitations, paroxysmal nocturnal dyspnea or shortness of breath Dermatological: negative for rash Respiratory: negative for cough or wheezing Urologic: negative for hematuria Abdominal: negative for nausea, vomiting, diarrhea, bright red blood per rectum, melena, or hematemesis Neurologic: negative for visual changes, syncope, or dizziness All other systems reviewed and are otherwise negative except as noted above.    Blood pressure (!) 168/72, pulse (!) 58, resp. rate 16, height 5\' 4"  (1.626 m), weight 152 lb (68.9 kg), SpO2 98 %.  General appearance: alert, cooperative, no distress and in wheel chair Neck: no JVD Lungs: clear to auscultation bilaterally Heart: regular rate and rhythm Extremities: no  edema Skin: pale, warm, dry Neurologic: Grossly normal, left sided paralysis  EKG NSR, SB  ASSESSMENT AND PLAN:   Accelerated hypertension Pt seen in the office today secondary to gradually increasing B/P over the last 1-2 months  Hx of CABG CABG 2004, Myoview low risk 2012 and echo showed normal LVF 2012  History of stroke Pt has a history of remote Rt brain stroke with left sided paralysis.   Parkinson's disease ([place]) Followed by Neurology. Pt is significantly disabled- in a wheel chair  Seizure disorder Dilantin tapered off in the last two months (his care giver thinks his B/P started creeping up after this).  Renal insufficiency GFR 54  Carotid stenosis Known bilateral carotid disease, s/p bilateral CEA in 1999, followed by staged bilateral CA stenting 2012   PLAN  I suggested we add Amlodipine 5 mg. This may need to be increased next week, his ex wife will monitor his B/P and let us know.  He has a history of bladder cancer and a diutretic doesn't appear to be a good option. I did order a BMP to f/u his mild renal insufficiency on the increased ARB.   F/U B/P with me in 3 weeks.   Kerin Ransom PA-C 08/18/2018 3:32 PM

## 2018-08-18 NOTE — Assessment & Plan Note (Signed)
Pt has a history of remote Rt brain stroke with left sided paralysis.  

## 2018-08-18 NOTE — Patient Instructions (Signed)
PLEASE FEEL FREE TO CALL OUR OFFICE WITH ANY PROBLEMS OR QUESTIONS ([phone])      

## 2018-08-18 NOTE — Assessment & Plan Note (Signed)
Followed by Neurology. Pt is significantly disabled- in a wheel chair 

## 2018-08-18 NOTE — Assessment & Plan Note (Signed)
Dilantin tapered off in the last two months (his care giver thinks his B/P started creeping up after this).

## 2018-08-18 NOTE — Assessment & Plan Note (Signed)
Known bilateral carotid disease, s/p bilateral CEA in 1999, followed by staged bilateral CA stenting 2012 

## 2018-08-18 NOTE — Patient Instructions (Addendum)
Medication Instructions:  START Norvasc 5mg  Take 1 tablet EVERY MORNING If you need a refill on your cardiac medications before your next appointment, please call your pharmacy.   Lab work: Your physician recommends that you return for lab work in: TODAY-BMET If you have labs (blood work) drawn today and your tests are completely normal, you will receive your results only by: Marland Kitchen MyChart Message (if you have MyChart) OR . A paper copy in the mail If you have any lab test that is abnormal or we need to change your treatment, we will call you to review the results.  Testing/Procedures: None   Follow-Up: At [facility], you and your health needs are our priority.  As part of our continuing mission to provide you with exceptional heart care, we have created designated Provider Care Teams.  These Care Teams include your primary Cardiologist (physician) and Advanced Practice Providers (APPs -  Physician Assistants and Nurse Practitioners) who all work together to provide you with the care you need, when you need it. . Your physician recommends that you schedule a follow-up appointment in: 3 WEEKS with Kerin Ransom, PA-C   Any Other Special Instructions Will Be Listed Below (If Applicable). RECORD BLOOD PRESSURE AND CONTACT THE OFFICE ON Monday OR Tuesday WITH THOSE READINGS.

## 2018-08-18 NOTE — Assessment & Plan Note (Signed)
Pt seen in the office today secondary to gradually increasing B/P over the last 1-2 months

## 2018-08-18 NOTE — Assessment & Plan Note (Signed)
GFR 54

## 2018-08-18 NOTE — Progress Notes (Signed)
Subjective:    Patient ID: [name], male    DOB: [birth year], 72 y.o.   MRN: [id number]  HPI  [name] is here now in follow-up of his chronic left hemiparesis and parkinsonism.  For the most part he has been doing fairly well.  He is still struggling with issues related to his bladder.  Has had some incontinence and frequency lately.  Some problems with a recent treatment which caused some irritability to his outflow pathway.  Neurology is working on weaning some of his medications and most recently he has been taken off the Dilantin.  This is help with his energy and arousal.  The plan is to start weaning his Sinemet as well.  He did have a fall at home recently when he was trying to get up to go to the bathroom due to the urinary issues above.  Likely he denies any major issues with this.  The most part otherwise he is feeling fairly well.  He uses a wheelchair for basic mobility around the house but also is able to walk with help of his wife.  Pain levels have been controlled.  He denies any new tremors or spasms.   Pain Inventory Average Pain 0 Pain Right Now 0 My pain is na  In the last 24 hours, has pain interfered with the following? General activity 0 Relation with others 0 Enjoyment of life 0 What TIME of day is your pain at its worst? na Sleep (in general) Good  Pain is worse with: na Pain improves with: na Relief from Meds: na  Mobility walk with assistance use a walker  Function retired  Neuro/Psych weakness tremor trouble walking  Prior Studies Any changes since last visit?  no  Physicians involved in your care Any changes since last visit?  no   Family History  Problem Relation Age of Onset  . Stroke Father   . Heart attack Father   . Stroke Mother   . Diabetes Brother   . Hyperlipidemia Brother   . Hypertension Brother   . Hyperlipidemia Sister   . Hypertension Sister    Social History   Socioeconomic History  . Marital status:  Divorced    Spouse name: Not on file  . Number of children: 4  . Years of education: HS  . Highest education level: Not on file  Occupational History    Employer: RETIRED  Social Needs  . Financial resource strain: Not on file  . Food insecurity:    Worry: Not on file    Inability: Not on file  . Transportation needs:    Medical: Not on file    Non-medical: Not on file  Tobacco Use  . Smoking status: Former Smoker    Years: 5.00    Types: Cigarettes    Last attempt to quit: 11/14/2007    Years since quitting: 10.7  . Smokeless tobacco: Never Used  Substance and Sexual Activity  . Alcohol use: No  . Drug use: No  . Sexual activity: Not on file  Lifestyle  . Physical activity:    Days per week: Not on file    Minutes per session: Not on file  . Stress: Not on file  Relationships  . Social connections:    Talks on phone: Not on file    Gets together: Not on file    Attends religious service: Not on file    Active member of club or organization: Not on file  Attends meetings of clubs or organizations: Not on file    Relationship status: Not on file  Other Topics Concern  . Not on file  Social History Narrative   Patient lives at home with his caregiver/ex-wife.   Caffeine- 2 cups of coffee daily   Past Surgical History:  Procedure Laterality Date  . CARDIAC CATHETERIZATION  11/17/2002   significant left main disease and 3 vessel CAD, mildly depressed LV systolic  fx, 69% left renal artery stenosis  . CAROTID ENDARTERECTOMY  12/1997   [facility])   Bilateral ICA  . CAROTID STENT INSERTION Bilateral dr berry and dr Kristine Royal--  (In-stent Restenosis)  LeftICA   02-12-2011;  [place]   02-26-2011  . CORONARY ARTERY BYPASS GRAFT  2/ 2004   dr hendrickson   x3 LIMA to LAD,free right internal mammary artery to obtuse marginal 1,SVG to distal right coronary.  . CYSTOSCOPY W/ RETROGRADES Bilateral 09/10/2015   Procedure: CYSTOSCOPY WITH RETROGRADE PYELOGRAM;  Surgeon:  Carolan Clines, MD;  Location[facility];  Service: Urology;  Laterality: Bilateral;  . CYSTOSCOPY WITH BIOPSY N/A 09/10/2015   Procedure: CYSTOSCOPY WITH BIOPSY,RIGHT TRIGONAL TUMOR 1.5 CM, EXCISIONAL BIOPSY WITH TAUBER FORCEP RIGHT POSTERIOR BLADDER WALL 1 CM, EXCISIONAL BIOPSY SATELITE BLADDER WALL 1 CM;  Surgeon: Carolan Clines, MD;  [facility];  Service: Urology;  Laterality: N/A;  . CYSTOSCOPY WITH BIOPSY N/A 03/26/2017   Procedure: CYSTOSCOPY WITH BIOPSY/;  Surgeon: Carolan Clines, MD;  Location: [facility];  Service: Urology;  Laterality: N/A;  . CYSTOSCOPY WITH HYDRODISTENSION AND BIOPSY N/A 10/29/2015   Procedure: REPEAT CYSTOSCOPY BIOPSY BLADDER DEEP MUSCLES BLADDER BIOPSY;  Surgeon: Carolan Clines, MD;  Location: [place];  Service: Urology;  Laterality: N/A;  . CYSTOSCOPY WITH URETHRAL DILATATION N/A 09/10/2015   Procedure: CYSTOSCOPY WITH URETHRAL DILATATION;  Surgeon: Carolan Clines, MD;  Location: [place];  Service: Urology;  Laterality: N/A;  . FULGURATION OF BLADDER TUMOR N/A 03/26/2017   Procedure: FULGURATION OF BLADDER TUMOR;  Surgeon: Carolan Clines, MD;  Location: [facility];  Service: Urology;  Laterality: N/A;  . [place]  . MOHS SURGERY  06-20-2015   side of nose  . NM MYOCAR PERF WALL MOTION  01/09/2011   dr berry   mild to mod. perfusion defect in the basal inferolateral & mid inferolaterl regions consistant with an infarct/scar, no signigicant ischemia demonstracted/  Low Risk scan, no sig. change from previous study/  normal LV function and wall motion , ef 63%  . ORIF RIGHT ANKLE FX  1998   hardware retained  . TRANSURETHRAL RESECTION OF BLADDER TUMOR N/A 09/10/2015   Procedure: CYSTO TRANSURETHRAL RESECTION OF BLADDER TUMOR (TURBT) OF LEFT POSTERIOR BLADDER TUMOR 2 CM;  Surgeon: Carolan Clines, MD;  Location: [facility];   Service: Urology;  Laterality: N/A;  . TRANSURETHRAL RESECTION OF BLADDER TUMOR Left 04/17/2016   Procedure: TRANSURETHRAL RESECTION OF BLADDER TUMOR (TURBT);  Surgeon: Carolan Clines, MD;  Location: [facility];  Service: Urology;  Laterality: Left;  . US ECHOCARDIOGRAPHY  01/03/2011   normal LVF, ef>55%/  mild LAE/ trace MR and TR,  mild AV sclerosis without stenosis   Past Medical History:  Diagnosis Date  . At high risk for falls   . Bladder cancer [facility])    urologist-  dr Gaynelle Arabian  . CAD (coronary artery disease) cardiologist-  dr berry  . Cerebrovascular arteriosclerosis   . Gait disturbance, post-stroke  uses cane, rollator, and w/c long distance  . Hemiparkinsonism [facility]) neurologist-- dr Leta Baptist   right side body  . History of basal cell carcinoma excision    Sept 2016--  MOH's surgery side of nose  . History of carotid artery stenosis cardiologist-- dr berry   bilateral --  1999 s/p  bilateral ICA endarterectomy and recurrent restenosis 2012  s/p  staged bilateral stenting   per last duplex 2015  stents widely patent  . History of CVA with residual deficit neurologist-  dr Leta Baptist   3/ 1999  Right MCA  and  12/ 2004  Anterior division of  Right MCA ---  residual left spactic hemiparesis and left foot drop  . History of MI (myocardial infarction)    02/ 2004   s/p  cabg   . HOH (hard of hearing)    LEFT EAR  POST cva  . Hyperextension deformity of left knee    wears brace  . Hyperlipidemia   . Hypertension   . Left foot drop    residual from CVA  -- wears brace  . Left spastic hemiparesis ([place])    residual CVA 1999  . Pancreatic pseudocyst   . Peripheral arterial disease ([place])   . S/P CABG x 4    02/ 2004  . Seizure disorder [facility]) last seizure 2011 due to confusion   neurologist-  dr Leta Baptist-- per note seizure documented 2008 breakthrough partial complex seizure (prior tonic-clonic seizure's post CVA)  . Type 2 diabetes, diet controlled ([place])     2  . Visual  neglect    LEFT EYE   Ht 5\' 4"  (1.626 m)   Wt 147 lb (66.7 kg)   BMI 25.23 kg/m   Opioid Risk Score:   Fall Risk Score:  `1  Depression screen PHQ 2/9  No flowsheet data found.   Review of Systems  Constitutional: Negative.   HENT: Negative.   Eyes: Negative.   Respiratory: Negative.   Cardiovascular: Negative.   Gastrointestinal: Negative.   Endocrine: Negative.   Genitourinary: Negative.   Musculoskeletal: Positive for gait problem.  Skin: Negative.   Allergic/Immunologic: Negative.   Neurological: Positive for tremors and weakness.  Hematological: Negative.   Psychiatric/Behavioral: Negative.   All other systems reviewed and are negative.      Objective:   Physical Exam General: No acute distress HEENT: EOMI, oral membranes moist Cards: reg rate  Chest: normal effort Abdomen: Soft, NT, ND Skin: dry, intact Extremities: no edema Musculoskeletal: Left shoulder subluxed inferiorly by about 1/4 inch.  no pain with ROM Neurological: Patient is alert and oriented x3.  Strength in the left upper extremity and left lower extremity remains 1-3 out of 5.  He has 1 to 1+ out of 4 resting tone in the left biceps and wrist and finger flexors.  No rigidity today.  No tremor.  No obvious bradykinesias. Psychiatric: He has a normal mood and affect. His behavior is normal. Judgment and thought content normal.  Skin:  Warm.  Some bruises along the left arm.    Assessment & Plan:  ASSESSMENT:  1. Right cerebrovascular accident, spastic left hemiparesis.  2. Seizure disorder.  3. Parkinsonism 4. Bladder cancer   PLAN:  1.  Neurology has instructed patient to begin weaning Sinemet.  I asked that he do this with caution.  Just because he does not have tremors, does not mean he has Parkinson's symptoms.  He has demonstrated significant bradykinesia is on examination and by history.  Slow taper should help to determine whether medication is helping him or not without  being too aggressive.  Suggested dropping 1 pill/week and observing closely.  Continue to practice good safety awareness and home exercise program.   2.Continue  keppra per neuro recs. 3. Dantrium will be continued for spasticity management.Liver function tests have been followed by primary team.  No refills were needed today. 4. Bladder cancer mgt per urology. 5.  Hematology work up for macrocytosis     12 months follow up.15 minutes of face to face patient care time were spent during this visit. All questions were encouraged and answered.

## 2018-08-19 LAB — BASIC METABOLIC PANEL
BUN/Creatinine Ratio: 24 (ref 10–24)
BUN: 40 mg/dL — ABNORMAL HIGH (ref 8–27)
CO2: 23 mmol/L (ref 20–29)
Calcium: 9.8 mg/dL (ref 8.6–10.2)
Chloride: 103 mmol/L (ref 96–106)
Creatinine, Ser: 1.66 mg/dL — ABNORMAL HIGH (ref 0.76–1.27)
GFR calc Af Amer: 46 mL/min/{1.73_m2} — ABNORMAL LOW (ref >59)
GFR calc non Af Amer: 40 mL/min/{1.73_m2} — ABNORMAL LOW (ref >59)
Glucose: 131 mg/dL — ABNORMAL HIGH (ref 65–99)
Potassium: 5.6 mmol/L — ABNORMAL HIGH (ref 3.5–5.2)
Sodium: 141 mmol/L (ref 134–144)

## 2018-08-24 ENCOUNTER — Telehealth: Payer: Self-pay | Admitting: Cardiovascular Disease

## 2018-08-24 NOTE — Telephone Encounter (Signed)
Looks like his pressure has started to drop since adding amlodipine.  If often takes a week or two to get the full benefit.  Have him continue with his current meds and keep the appt with [facility] in December.  Should his pressure go back into the 150-160's for more than 2 days he can call again.

## 2018-08-24 NOTE — Telephone Encounter (Signed)
Returned call-aware of recommendations and verbalized understanding.

## 2018-08-24 NOTE — Telephone Encounter (Signed)
  Pt c/o BP issue: STAT if pt c/o blurred vision, one-sided weakness or slurred speech  1. What are your last 5 BP readings? 150/68 p 56, 150/67 p 56, 145/64 p 57, 136/61 p 59, 140/70 p59, 141/66 p 54  2. Are you having any other symptoms (ex. Dizziness, headache, blurred vision, passed out)? no  3. What is your BP issue? [name] wanted a few days worth of readings since they adjusted medications.

## 2018-09-08 ENCOUNTER — Ambulatory Visit: Payer: Medicare Other | Admitting: Cardiology

## 2018-09-13 ENCOUNTER — Telehealth: Payer: Self-pay | Admitting: Diagnostic Neuroimaging

## 2018-09-13 NOTE — Telephone Encounter (Signed)
Pt wife(on dpr-[place],[name]) has called re: pt's FOLBIC 2.5-25-2 MG TABS tablet.  She states the cost has gone up and she would like to know if Dr Leta Baptist could prescribe something not so expensive(cost has gone from $34.00 for 3 months to now over $70.00) please call

## 2018-09-13 NOTE — Telephone Encounter (Signed)
Spoke with wife, on [place] and asked if Dr Erling Cruz had prescribed Folbic in past. She stated he had. This RN advised her per Dr Leta Baptist, he can take a OTC MVI and vitamin B complex instead of prescription.  She stated that she had a B complex but was told not to give it to him. This RN advised that was most likely because he was on the [place] and didn't need to add to it.  She verbalized understanding, appreciation of call.

## 2018-09-14 ENCOUNTER — Encounter: Payer: Self-pay | Admitting: Cardiology

## 2018-09-14 ENCOUNTER — Ambulatory Visit (INDEPENDENT_AMBULATORY_CARE_PROVIDER_SITE_OTHER): Payer: Medicare Other | Admitting: Cardiology

## 2018-09-14 VITALS — BP 150/65 | HR 57 | Ht 64.0 in | Wt 153.0 lb

## 2018-09-14 DIAGNOSIS — Z8673 Personal history of transient ischemic attack (TIA), and cerebral infarction without residual deficits: Secondary | ICD-10-CM | POA: Diagnosis not present

## 2018-09-14 DIAGNOSIS — I6523 Occlusion and stenosis of bilateral carotid arteries: Secondary | ICD-10-CM | POA: Diagnosis not present

## 2018-09-14 DIAGNOSIS — I10 Essential (primary) hypertension: Secondary | ICD-10-CM

## 2018-09-14 DIAGNOSIS — G40909 Epilepsy, unspecified, not intractable, without status epilepticus: Secondary | ICD-10-CM

## 2018-09-14 DIAGNOSIS — G20 Parkinson's disease: Secondary | ICD-10-CM

## 2018-09-14 MED ORDER — AMLODIPINE BESYLATE 10 MG PO TABS: 10.0000 mg | ORAL_TABLET | Freq: Every day | ORAL | 3 refills | Status: DC

## 2018-09-14 MED ORDER — AMLODIPINE BESYLATE 5 MG PO TABS: 5.0000 mg | ORAL_TABLET | Freq: Every day | ORAL | 3 refills | Status: DC

## 2018-09-14 NOTE — Patient Instructions (Signed)
Medication Instructions:  INCREASE 10mg  Take 1 tablet once a day  If you need a refill on your cardiac medications before your next appointment, please call your pharmacy.   Lab work: None  If you have labs (blood work) drawn today and your tests are completely normal, you will receive your results only by: Marland Kitchen MyChart Message (if you have MyChart) OR . A paper copy in the mail If you have any lab test that is abnormal or we need to change your treatment, we will call you to review the results.  Testing/Procedures: None   Follow-Up: At [facility], you and your health needs are our priority.  As part of our continuing mission to provide you with exceptional heart care, we have created designated Provider Care Teams.  These Care Teams include your primary Cardiologist (physician) and Advanced Practice Providers (APPs -  Physician Assistants and Nurse Practitioners) who all work together to provide you with the care you need, when you need it. Your physician recommends that you schedule a follow-up appointment in: January 2020 WITH DR BERRY  Any Other Special Instructions Will Be Listed Below (If Applicable).

## 2018-09-14 NOTE — Progress Notes (Signed)
09/14/2018 [name]   [birth year]  [id number]   Primary Physician [name], MD Primary Cardiologist: Dr Gwenlyn Found  HPI:  The patient is a 72 year old male followed by Dr. Gwenlyn Found.  He has a history of coronary disease, vascular disease, HTN, and Parkinson's disease.  He had remote bypass grafting in 2004.  Myoview in 2012 was low risk and an echocardiogram in 2012 showed normal LV function.  He has not had problems with angina or heart failure.  He has carotid artery disease and had remote bilateral carotid endarterectomy in 1999.  He developed restenosis in 2012 and underwent staged carotid stenting by Dr. Gwenlyn Found and Dr. Trula Slade.  The patient has Parkinson's disease and has had a remote stroke with left sided paralysis.  He is followed by Dr. Stark Klein.  He has a seizure disorder and a few months ago was tapered off Dilantin as he has not had a seizure in some years.     He was seen in the office 08/18/2018 with a recent increase in his blood pressure.  His Benicar had been increased as an outpatient from 20 mg to 40 mg.  When I saw him his blood pressure was 180/72.  For some reason he is been taking his Benicar at noon and his metoprolol at bedtime.  I added amlodipine 5 mg.  A BM P was obtained which showed a bump in his BUN and creatinine and his Benicar was cut back to his previous dose of 20 mg based on this.  He is seeing me today in follow-up. symptomatically he is fine, no chest pain, no dyspnea.  His B/P has been running 696'V systolic.   Current Outpatient Medications  Medication Sig Dispense Refill  . amLODipine (NORVASC) 10 MG tablet Take 1 tablet (10 mg total) by mouth daily. 180 tablet 3  . carbidopa-levodopa (SINEMET IR) 25-100 MG tablet Take 2 tablets by mouth 3 (three) times daily. 540 tablet 4  . cholecalciferol (VITAMIN D) 1000 units tablet Take 2,000 Units by mouth daily with breakfast.     . Coenzyme Q10 (COQ-10) 50 MG CAPS Take by mouth 2 (two) times daily.     .  dantrolene (DANTRIUM) 50 MG capsule Take 1 capsule (50 mg total) by mouth 2 (two) times daily. 180 capsule 3  . dipyridamole-aspirin (AGGRENOX) 200-25 MG 12hr capsule TAKE ONE CAPSULE BY MOUTH TWICE A DAY 180 capsule 3  . ezetimibe (ZETIA) 10 MG tablet TAKE ONE TABLET (10MG ) BY MOUTH DAILY 90 tablet 3  . fenofibrate (TRICOR) 48 MG tablet TAKE 1 TABLET BY MOUTH DAILY 90 tablet 3  . Flaxseed, Linseed, 1000 MG CAPS Take 1,000 mg by mouth every evening.     Marland Kitchen FOLBIC 2.5-25-2 MG TABS tablet TAKE 1 TABLET DAILY WITH FOOD 90 tablet 1  . insulin lispro (HUMALOG KWIKPEN) 100 UNIT/ML KiwkPen Inject 4 Units into the skin See admin instructions. Takes 3 times daily before meals for sugar over 200 only    . KEPPRA 500 MG tablet Take 1 tablet (500 mg total) by mouth 2 (two) times daily. 180 tablet 4  . metoprolol tartrate (LOPRESSOR) 25 MG tablet TAKE ONE TABLET BY MOUTH DAILY (KEEP OFFICE VISIT) 90 tablet 3  . nitroGLYCERIN (NITROSTAT) 0.4 MG SL tablet Place 1 tablet (0.4 mg total) under the tongue every 5 (five) minutes as needed for chest pain. 25 tablet 3  . ONETOUCH DELICA LANCETS 89F MISC     . ONETOUCH VERIO test strip     .  RAPAFLO 8 MG CAPS capsule Take 8 mg by mouth daily with breakfast.     . rosuvastatin (CRESTOR) 20 MG tablet Take 20 mg by mouth daily.    Marland Kitchen olmesartan (BENICAR) 20 MG tablet Take 1 tablet by mouth daily.     No current facility-administered medications for this visit.     Allergies  Allergen Reactions  . Altace [Ramipril] Cough    Past Medical History:  Diagnosis Date  . At high risk for falls   . Bladder cancer [facility])    urologist-  dr Gaynelle Arabian  . CAD (coronary artery disease) cardiologist-  dr berry  . Cerebrovascular arteriosclerosis   . Gait disturbance, post-stroke    uses cane, rollator, and w/c long distance  . [facility]insonism [facility]) neurologist-- dr Leta Baptist   right side body  . History of basal cell carcinoma excision    Sept 2016--  MOH's surgery side of  nose  . History of carotid artery stenosis cardiologist-- dr berry   bilateral --  1999 s/p  bilateral ICA endarterectomy and recurrent restenosis 2012  s/p  staged bilateral stenting   per last duplex 2015  stents widely patent  . History of CVA with residual deficit neurologist-  dr Leta Baptist   3/ 1999  Right MCA  and  12/ 2004  Anterior division of  Right MCA ---  residual left spactic hemiparesis and left foot drop  . History of MI (myocardial infarction)    02/ 2004   s/p  cabg   . HOH (hard of hearing)    LEFT EAR  POST cva  . Hyperextension deformity of left knee    wears brace  . Hyperlipidemia   . Hypertension   . Left foot drop    residual from CVA  -- wears brace  . Left spastic hemiparesis ([place])    residual CVA 1999  . Pancreatic pseudocyst   . Peripheral arterial disease ([place])   . S/P CABG x 4    02/ 2004  . Seizure disorder [facility]) last seizure 2011 due to confusion   neurologist-  dr Leta Baptist-- per note seizure documented 2008 breakthrough partial complex seizure (prior tonic-clonic seizure's post CVA)  . Type 2 diabetes, diet controlled ([place])     2  . Visual neglect    LEFT EYE    Social History   Socioeconomic History  . Marital status: Divorced    Spouse name: Not on file  . Number of children: 4  . Years of education: HS  . Highest education level: Not on file  Occupational History    Employer: RETIRED  Social Needs  . Financial resource strain: Not on file  . Food insecurity:    Worry: Not on file    Inability: Not on file  . Transportation needs:    Medical: Not on file    Non-medical: Not on file  Tobacco Use  . Smoking status: Former Smoker    Years: 5.00    Types: Cigarettes    Last attempt to quit: 11/14/2007    Years since quitting: 10.8  . Smokeless tobacco: Never Used  Substance and Sexual Activity  . Alcohol use: No  . Drug use: No  . Sexual activity: Not on file  Lifestyle  . Physical activity:    Days per week: Not on file     Minutes per session: Not on file  . Stress: Not on file  Relationships  . Social connections:    Talks on phone: Not on  file    Gets together: Not on file    Attends religious service: Not on file    Active member of club or organization: Not on file    Attends meetings of clubs or organizations: Not on file    Relationship status: Not on file  . Intimate partner violence:    Fear of current or ex partner: Not on file    Emotionally abused: Not on file    Physically abused: Not on file    Forced sexual activity: Not on file  Other Topics Concern  . Not on file  Social History Narrative   Patient lives at home with his caregiver/ex-wife.   Caffeine- 2 cups of coffee daily     Family History  Problem Relation Age of Onset  . Stroke Father   . Heart attack Father   . Stroke Mother   . Diabetes Brother   . Hyperlipidemia Brother   . Hypertension Brother   . Hyperlipidemia Sister   . Hypertension Sister      Review of Systems: General: negative for chills, fever, night sweats or weight changes.  Cardiovascular: negative for chest pain, dyspnea on exertion, edema, orthopnea, palpitations, paroxysmal nocturnal dyspnea or shortness of breath Dermatological: negative for rash Respiratory: negative for cough or wheezing Urologic: negative for hematuria Abdominal: negative for nausea, vomiting, diarrhea, bright red blood per rectum, melena, or hematemesis Neurologic: negative for visual changes, syncope, or dizziness All other systems reviewed and are otherwise negative except as noted above.    Blood pressure (!) 150/65, pulse (!) 57, height 5\' 4"  (1.626 m), weight 153 lb (69.4 kg), SpO2 100 %.  General appearance: alert, cooperative, no distress and in wheelchair Neck: no JVD and RCA bruit Lungs: clear to auscultation bilaterally Heart: regular rate and rhythm Extremities: no edema Neurologic: Grossly normal   ASSESSMENT AND PLAN:   Accelerated hypertension Pt seen  in the office today as a follow up for B/P.  His B/P is improved on Amlodipine 5 mg , still not as close to goal as I would like.   Hx of CABG CABG 2004, Myoview low risk 2012 and echo showed normal LVF 2012  History of stroke Pt has a history of remote Rt brain stroke with left sided paralysis. On Aggrenox.  Parkinson's disease ([place]) Followed by Neurology. Pt is significantly disabled- in a wheel chair  Seizure disorder Dilantin tapered off months (his care giver thinks his B/P started creeping up after this). No seizures  Renal insufficiency GFR 54. His SCr bumped from 1.2 to 1.6 on increased Benicar- this has been decreased to 20 mg, his previous dose.  Carotid stenosis Known bilateral carotid disease, s/p bilateral CEA in 1999, followed by staged bilateral CA stenting 2012. The pt has not been able to afford the co pay for follow up dopplers.   PLAN increase amlodipine to 10 mg.  He will keep his usual annual follow-up with Dr. Alvester Chou in January.  If his blood pressure is still in the 161 systolic I would consider adding an alpha-blocker.  Kerin Ransom PA-C 09/14/2018 2:42 PM

## 2018-09-22 ENCOUNTER — Other Ambulatory Visit: Payer: Self-pay | Admitting: Physical Medicine & Rehabilitation

## 2018-09-29 ENCOUNTER — Other Ambulatory Visit: Payer: Self-pay | Admitting: Cardiovascular Disease

## 2018-10-01 ENCOUNTER — Telehealth: Payer: Self-pay | Admitting: *Deleted

## 2018-10-01 MED ORDER — FA-PYRIDOXINE-CYANOCOBALAMIN 2.5-25-2 MG PO TABS: 1.0000 | ORAL_TABLET | Freq: Every day | ORAL | 1 refills | Status: DC

## 2018-10-01 NOTE — Telephone Encounter (Signed)
Received fax from [facility] requesting refill on Folbic tabs.  Refill done through e scribe.

## 2018-10-22 ENCOUNTER — Encounter: Payer: Self-pay | Admitting: Cardiovascular Disease

## 2018-10-22 ENCOUNTER — Ambulatory Visit: Payer: Medicare Other | Admitting: Cardiovascular Disease

## 2018-10-22 DIAGNOSIS — I10 Essential (primary) hypertension: Secondary | ICD-10-CM

## 2018-10-22 DIAGNOSIS — E785 Hyperlipidemia, unspecified: Secondary | ICD-10-CM

## 2018-10-22 DIAGNOSIS — I6523 Occlusion and stenosis of bilateral carotid arteries: Secondary | ICD-10-CM

## 2018-10-22 DIAGNOSIS — Z951 Presence of aortocoronary bypass graft: Secondary | ICD-10-CM

## 2018-10-22 NOTE — Addendum Note (Signed)
Addended by: Annita Brod on: 10/22/2018 04:17 PM   Modules accepted: Orders

## 2018-10-22 NOTE — Patient Instructions (Signed)
Medication Instructions:  NONE If you need a refill on your cardiac medications before your next appointment, please call your pharmacy.   Lab work: NONE If you have labs (blood work) drawn today and your tests are completely normal, you will receive your results only by: Marland Kitchen MyChart Message (if you have MyChart) OR . A paper copy in the mail If you have any lab test that is abnormal or we need to change your treatment, we will call you to review the results.  Testing/Procedures: Your physician has requested that you have a carotid duplex. This test is an ultrasound of the carotid arteries in your neck. It looks at blood flow through these arteries that supply the brain with blood. Allow one hour for this exam. There are no restrictions or special instructions.   Follow-Up: At [facility], you and your health needs are our priority.  As part of our continuing mission to provide you with exceptional heart care, we have created designated Provider Care Teams.  These Care Teams include your primary Cardiologist (physician) and Advanced Practice Providers (APPs -  Physician Assistants and Nurse Practitioners) who all work together to provide you with the care you need, when you need it. . You will need a follow up appointment in 6 months with Kerin Ransom, PA and 12 months with Dr. Gwenlyn Found.  Please call our office 2 months in advance to schedule each appointment.  You may see Dr. Gwenlyn Found or one of the following Advanced Practice Providers on your designated Care Team:   . Kerin Ransom, [place] . Almyra Deforest, PA-C . Fabian Sharp, PA-C . Jory Sims, DNP . Rosaria Ferries, PA-C . Roby Lofts, PA-C . Sande Rives, PA-C  Any Other Special Instructions Will Be Listed Below (If Applicable). ATTACHED TO YOUR AFTER VISIT SUMMARY IS THE [facility] CONTACT INFORMATION: CAROTID ARTERY DISEASE VASCUSCREEN FOR $50  [phone]

## 2018-10-22 NOTE — Assessment & Plan Note (Addendum)
History of essential hypertension her blood pressure much better today measured at at 120/60.  Amlodipine was recently added.  In addition, he is on metoprolol and Benicar.  Blood pressure readings at home have been in the 140/70 range.

## 2018-10-22 NOTE — Progress Notes (Signed)
10/22/2018 [name]   [birth year]  [id number]  Primary Physician [name], MD Primary Cardiologist: Lorretta Harp MD [name], [place]  HPI:  [name] is a 73 y.o.  mildly overweight but was Caucasian male father of 4 children accompanied by his first ex-wife who is his caregiver. I last saw him in the office  09/29/2017. His primary care physician is Dr. [name]. He he is a retired Administrator. His cardiac risk factor profile was positive for 75 pack years of tobacco smoking having quit in 2009. He is treated hyperlipidemia. He did have bilateral carotid endarterectomies back in 1999 a sister with a stroke that caused left hemiparesis and paresthesias. He had bilateral carotid stenting performed by Dr. Trula Slade and myself in a staged fashion because of restenosis back in 2012. He also clearly has Parkinson's disease which has been progressive over the last year now requiring him to ambulate with the aid of a walker.. He had coronary bypass grafting in 2004 by Dr. Merilynn Finland and a Myoview stress test performed 01/09/11 was nonischemic. He denies chest pain or shortness of breath. He has been diagnosed with bladder cancer and is currently being treated by Dr. Denton Brick. He has had 4 bladder surgeries and treatment with BCG.. Addition, he was recently diagnosed with Parkinson's disease and is wheelchair-bound.  He saw Kerin Ransom in the office 09/14/2018 and was hypertensive.  His medications were adjusted.  He denies chest pain or shortness of breath.   Current Meds  Medication Sig  . amLODipine (NORVASC) 10 MG tablet Take 1 tablet (10 mg total) by mouth daily.  . carbidopa-levodopa (SINEMET IR) 25-100 MG tablet Take 2 tablets by mouth 3 (three) times daily.  . cholecalciferol (VITAMIN D) 1000 units tablet Take 2,000 Units by mouth daily with breakfast.   . Coenzyme Q10 (COQ-10) 50 MG CAPS Take by mouth 2 (two) times daily.   . dantrolene  (DANTRIUM) 50 MG capsule TAKE ONE CAPSULE BY MOUTH TWICE A DAY  . dipyridamole-aspirin (AGGRENOX) 200-25 MG 12hr capsule TAKE ONE CAPSULE BY MOUTH TWICE A DAY  . ezetimibe (ZETIA) 10 MG tablet TAKE ONE TABLET (10MG ) BY MOUTH DAILY  . fenofibrate (TRICOR) 48 MG tablet TAKE 1 TABLET BY MOUTH DAILY  . Flaxseed, Linseed, 1000 MG CAPS Take 1,000 mg by mouth every evening.   . folic acid-pyridoxine-cyancobalamin (FOLBIC) 2.5-25-2 MG TABS tablet Take 1 tablet by mouth daily. with food  . insulin lispro (HUMALOG KWIKPEN) 100 UNIT/ML KiwkPen Inject 4 Units into the skin See admin instructions. Takes 3 times daily before meals for sugar over 200 only  . KEPPRA 500 MG tablet Take 1 tablet (500 mg total) by mouth 2 (two) times daily.  . metoprolol tartrate (LOPRESSOR) 25 MG tablet TAKE ONE TABLET BY MOUTH DAILY (KEEP OFFICE VISIT)  . nitroGLYCERIN (NITROSTAT) 0.4 MG SL tablet Place 1 tablet (0.4 mg total) under the tongue every 5 (five) minutes as needed for chest pain.  Marland Kitchen olmesartan (BENICAR) 20 MG tablet Take 1 tablet by mouth daily.  Glory Rosebush DELICA LANCETS 14G MISC   . ONETOUCH VERIO test strip   . RAPAFLO 8 MG CAPS capsule Take 8 mg by mouth daily with breakfast.   . rosuvastatin (CRESTOR) 20 MG tablet Take 20 mg by mouth daily.     Allergies  Allergen Reactions  . Altace [Ramipril] Cough    Social History   Socioeconomic History  . Marital status: Divorced  Spouse name: Not on file  . Number of children: 4  . Years of education: HS  . Highest education level: Not on file  Occupational History    Employer: RETIRED  Social Needs  . Financial resource strain: Not on file  . Food insecurity:    Worry: Not on file    Inability: Not on file  . Transportation needs:    Medical: Not on file    Non-medical: Not on file  Tobacco Use  . Smoking status: Former Smoker    Years: 5.00    Types: Cigarettes    Last attempt to quit: 11/14/2007    Years since quitting: 10.9  . Smokeless  tobacco: Never Used  Substance and Sexual Activity  . Alcohol use: No  . Drug use: No  . Sexual activity: Not on file  Lifestyle  . Physical activity:    Days per week: Not on file    Minutes per session: Not on file  . Stress: Not on file  Relationships  . Social connections:    Talks on phone: Not on file    Gets together: Not on file    Attends religious service: Not on file    Active member of club or organization: Not on file    Attends meetings of clubs or organizations: Not on file    Relationship status: Not on file  . Intimate partner violence:    Fear of current or ex partner: Not on file    Emotionally abused: Not on file    Physically abused: Not on file    Forced sexual activity: Not on file  Other Topics Concern  . Not on file  Social History Narrative   Patient lives at home with his caregiver/ex-wife.   Caffeine- 2 cups of coffee daily     Review of Systems: General: negative for chills, fever, night sweats or weight changes.  Cardiovascular: negative for chest pain, dyspnea on exertion, edema, orthopnea, palpitations, paroxysmal nocturnal dyspnea or shortness of breath Dermatological: negative for rash Respiratory: negative for cough or wheezing Urologic: negative for hematuria Abdominal: negative for nausea, vomiting, diarrhea, bright red blood per rectum, melena, or hematemesis Neurologic: negative for visual changes, syncope, or dizziness All other systems reviewed and are otherwise negative except as noted above.    Blood pressure 128/60, pulse (!) 59, weight 154 lb 6.4 oz (70 kg).  General appearance: alert and no distress Neck: no adenopathy, no JVD, supple, symmetrical, trachea midline, thyroid not enlarged, symmetric, no tenderness/mass/nodules and Soft right carotid bruit Lungs: clear to auscultation bilaterally Heart: regular rate and rhythm, S1, S2 normal, no murmur, click, rub or gallop Extremities: extremities normal, atraumatic, no  cyanosis or edema Pulses: 2+ and symmetric Skin: Skin color, texture, turgor normal. No rashes or lesions Neurologic: Alert and oriented X 3, normal strength and tone. Normal symmetric reflexes. Normal coordination and gait  EKG not performed today  ASSESSMENT AND PLAN:   Accelerated hypertension History of essential hypertension her blood pressure much better today measured at at 120/60.  Amlodipine was recently added.  In addition, he is on metoprolol and Benicar.  Blood pressure readings at home have been in the 140/70 range.  Carotid stenosis History of bilateral carotid endarterectomies in 1999 and staged bilateral carotid artery stenting in 2012 by Dr. Trula Slade and myself.  Last carotid Dopplers performed 11/14/2013 revealed patent stents.  He does have a soft right carotid bruit and probably needs follow-up carotid Doppler study.  Hx of CABG  History of CAD status post bypass graft in 2004 by Dr. Roxan Hockey.  He had a Myoview performed 01/09/2011 which was nonischemic.  He denies chest pain or shortness of breath.  Hyperlipidemia History of hyperlipidemia on statin therapy lipid profile performed 02/08/2018 revealing total cholesterol 127, LDL 69 and HDL 39.      Lorretta Harp MD [facility] 10/22/2018 4:07 PM

## 2018-10-22 NOTE — Assessment & Plan Note (Signed)
History of hyperlipidemia on statin therapy lipid profile performed 02/08/2018 revealing total cholesterol 127, LDL 69 and HDL 39.

## 2018-10-22 NOTE — Assessment & Plan Note (Signed)
History of bilateral carotid endarterectomies in 1999 and staged bilateral carotid artery stenting in 2012 by Dr. Trula Slade and myself.  Last carotid Dopplers performed 11/14/2013 revealed patent stents.  He does have a soft right carotid bruit and probably needs follow-up carotid Doppler study.

## 2018-10-22 NOTE — Assessment & Plan Note (Signed)
History of CAD status post bypass graft in 2004 by Dr. Roxan Hockey.  He had a Myoview performed 01/09/2011 which was nonischemic.  He denies chest pain or shortness of breath.

## 2018-11-01 ENCOUNTER — Ambulatory Visit (HOSPITAL_COMMUNITY)
Admission: RE | Admit: 2018-11-01 | Discharge: 2018-11-01 | Disposition: A | Discharge status: Home / Self Care | Payer: Medicare Other | Source: Ambulatory Visit | Attending: Internal Medicine | Admitting: Internal Medicine

## 2018-11-01 DIAGNOSIS — I6523 Occlusion and stenosis of bilateral carotid arteries: Secondary | ICD-10-CM

## 2018-11-02 ENCOUNTER — Other Ambulatory Visit: Payer: Self-pay | Admitting: *Deleted

## 2018-11-02 DIAGNOSIS — I6523 Occlusion and stenosis of bilateral carotid arteries: Secondary | ICD-10-CM

## 2018-11-03 ENCOUNTER — Other Ambulatory Visit: Payer: Self-pay | Admitting: Cardiovascular Disease

## 2018-11-11 ENCOUNTER — Other Ambulatory Visit: Payer: Self-pay | Admitting: Cardiovascular Disease

## 2018-11-11 NOTE — Telephone Encounter (Signed)
Rx request sent to pharmacy.  

## 2018-12-09 ENCOUNTER — Other Ambulatory Visit: Payer: Self-pay | Admitting: Cardiovascular Disease

## 2018-12-09 NOTE — Telephone Encounter (Signed)
Rx(s) sent to pharmacy electronically.  

## 2018-12-21 ENCOUNTER — Telehealth: Payer: Self-pay

## 2018-12-21 NOTE — Telephone Encounter (Signed)
Received notification from Kristopher Oppenheim about drug interaction between Amlodipine and Dantrolene. Okay to fill both? Please advise.

## 2018-12-21 NOTE — Telephone Encounter (Signed)
Please have this question addressed by Cyril Mourning

## 2018-12-21 NOTE — Telephone Encounter (Signed)
Dantrolene may enhance the hyperkalemic effect of Calcium Channel Blockers (Nondihydropyridine) - Verapamil & Diltiazem.   Patient taking both products for at least 3 months without problems.   Okay to continue both therapies.

## 2018-12-22 NOTE — Telephone Encounter (Signed)
Stephanie at [employer] ok to continue both

## 2018-12-22 NOTE — Telephone Encounter (Signed)
TRIED TO CALL PHARMACY THEY ARE CLOSED FOR LUNCH UNTIL 230PM

## 2018-12-25 ENCOUNTER — Other Ambulatory Visit: Payer: Self-pay | Admitting: Cardiovascular Disease

## 2019-01-05 ENCOUNTER — Other Ambulatory Visit: Payer: Self-pay | Admitting: Diagnostic Neuroimaging

## 2019-02-02 ENCOUNTER — Encounter (HOSPITAL_COMMUNITY): Payer: Self-pay | Admitting: Emergency Medicine

## 2019-02-02 ENCOUNTER — Other Ambulatory Visit: Payer: Self-pay

## 2019-02-02 ENCOUNTER — Emergency Department (HOSPITAL_COMMUNITY)
Admission: EM | Admit: 2019-02-02 | Discharge: 2019-02-02 | Disposition: A | Discharge status: Home / Self Care | Payer: Medicare Other | Attending: Emergency Medicine | Admitting: Emergency Medicine

## 2019-02-02 DIAGNOSIS — I10 Essential (primary) hypertension: Secondary | ICD-10-CM | POA: Insufficient documentation

## 2019-02-02 DIAGNOSIS — Z79899 Other long term (current) drug therapy: Secondary | ICD-10-CM | POA: Insufficient documentation

## 2019-02-02 DIAGNOSIS — Z794 Long term (current) use of insulin: Secondary | ICD-10-CM | POA: Insufficient documentation

## 2019-02-02 DIAGNOSIS — Z87891 Personal history of nicotine dependence: Secondary | ICD-10-CM | POA: Insufficient documentation

## 2019-02-02 DIAGNOSIS — G20 Parkinson's disease: Secondary | ICD-10-CM | POA: Insufficient documentation

## 2019-02-02 DIAGNOSIS — L02214 Cutaneous abscess of groin: Secondary | ICD-10-CM | POA: Diagnosis not present

## 2019-02-02 DIAGNOSIS — E119 Type 2 diabetes mellitus without complications: Secondary | ICD-10-CM | POA: Diagnosis not present

## 2019-02-02 DIAGNOSIS — L0291 Cutaneous abscess, unspecified: Secondary | ICD-10-CM

## 2019-02-02 MED ORDER — CLINDAMYCIN HCL 300 MG PO CAPS: 300.0000 mg | ORAL_CAPSULE | Freq: Three times a day (TID) | ORAL | 0 refills | Status: DC

## 2019-02-02 NOTE — ED Notes (Signed)
Patient verbalizes understanding of discharge instructions . Opportunity for questions and answers were provided . Armband removed by staff ,Pt discharged from ED. W/C  offered at D/C  and Declined W/C at D/C and was escorted to lobby by RN.  

## 2019-02-02 NOTE — ED Triage Notes (Signed)
Pt brought in by wife, sent by PCP for groin wound check.

## 2019-02-02 NOTE — ED Provider Notes (Signed)
Medical screening examination/treatment/procedure(s) were conducted as a shared visit with non-physician practitioner(s) and myself.  I personally evaluated the patient during the encounter. Briefly, the patient is a 73 y.o. male with history of high cholesterol, hypertension, bladder cancer, CAD, type 2 diabetes, left spastic hemiparesis from a stroke presents to the ED with left groin rash.  Patient with normal vitals.  No fever.  Patient with groin rash over the last day.  Had drainage from left inguinal area yesterday.  Patient has some skin breakdown over the left inguinal area.  There is an open area where likely prior abscess has already been drained.  There is some mild redness in this area.  No crepitus.  No concern for Fournier's gangrene.  No obvious tenderness.  Likely fungal versus cellulitic process.  Patient has antifungal cream use at home.  Will treat with oral antibiotics.  Redness was outlined in blue marker.  Given strict return precautions.  No concern for systemic infection given normal vital signs.  Patient has overall well controlled diabetes with diet.  He is not on any medication for this.  Given return precautions.  There does not appear to be any scrotal involvement at this time. Recommend f/u with PCP.  This chart was dictated using voice recognition software.  Despite best efforts to proofread,  errors can occur which can change the documentation meaning.    EKG Interpretation None           Lennice Sites, DO 02/02/19 1217

## 2019-02-02 NOTE — ED Provider Notes (Signed)
[facility] EMERGENCY DEPARTMENT Provider Note   CSN: [id number] Arrival date & time: 02/02/19  1138    History   Chief Complaint No chief complaint on file.   HPI [name] is a 73 y.o. male.     HPI  73 year old male with a significant past medical history of left-sided hemiparesis secondary to stroke, 2 diabetes well controlled with diet,hypertension,  presents today with complaints of infection to his left groin.   Patient's ex-wife and caretaker is at bedside.  She reports that today she noticed discharge and a sore to the left groin.  She is uncertain how long this is been here.  She denies any erythema yesterday, she notes no erythema this morning but at the time of evaluation some irritation around the area.  She notes no fever or systemic symptoms.  Patient denies any significant pain or discomfort, notes that he no sensation on his left side.  He does not endorse abdominal pain testicular pain scrotal pain fever nausea or vomiting.  His caretaker reports that he is a diabetic type II but is very well controlled in the low 100s with diet alone.  She denies a history of MRSA.   Past Medical History:  Diagnosis Date  . At high risk for falls   . Bladder cancer [facility])    urologist-  dr Gaynelle Arabian  . CAD (coronary artery disease) cardiologist-  dr berry  . Cerebrovascular arteriosclerosis   . Gait disturbance, post-stroke    uses cane, rollator, and w/c long distance  . Hemiparkinsonism [facility]) neurologist-- dr Leta Baptist   right side body  . History of basal cell carcinoma excision    Sept 2016--  MOH's surgery side of nose  . History of carotid artery stenosis cardiologist-- dr berry   bilateral --  1999 s/p  bilateral ICA endarterectomy and recurrent restenosis 2012  s/p  staged bilateral stenting   per last duplex 2015  stents widely patent  . History of CVA with residual deficit neurologist-  dr Leta Baptist   3/ 1999  Right MCA  and  12/ 2004   Anterior division of  Right MCA ---  residual left spactic hemiparesis and left foot drop  . History of MI (myocardial infarction)    02/ 2004   s/p  cabg   . HOH (hard of hearing)    LEFT EAR  POST cva  . Hyperextension deformity of left knee    wears brace  . Hyperlipidemia   . Hypertension   . Left foot drop    residual from CVA  -- wears brace  . Left spastic hemiparesis ([place])    residual CVA 1999  . Pancreatic pseudocyst   . Peripheral arterial disease ([place])   . S/P CABG x 4    02/ 2004  . Seizure disorder [facility]) last seizure 2011 due to confusion   neurologist-  dr Leta Baptist-- per note seizure documented 2008 breakthrough partial complex seizure (prior tonic-clonic seizure's post CVA)  . Type 2 diabetes, diet controlled ([place])     2  . Visual neglect    LEFT EYE    Patient Active Problem List   Diagnosis Date Noted  . Accelerated hypertension 08/18/2018  . Renal insufficiency 08/18/2018  . History of stroke 08/18/2018  . Essential hypertension 09/25/2015  . Parkinsonism ([place]) 04/19/2015  . Essential tremor 04/19/2015  . Parkinson's disease ([place]) 06/28/2014  . Hx of CABG 03/24/2014  . Hyperlipidemia 03/24/2014  . Aftercare following surgery of the  circulatory system, NEC 11/14/2013  . Chronic ischemic right MCA stroke 03/08/2013  . Seizure disorder ([place]) 03/08/2013  . Cerebral thrombosis with cerebral infarction ([place]) 03/29/2012  . Spastic hemiplegia affecting nondominant side ([place]) 03/29/2012  . Seizure ([place]) 03/29/2012  . Carotid stenosis 10/20/2011    Past Surgical History:  Procedure Laterality Date  . CARDIAC CATHETERIZATION  11/17/2002   significant left main disease and 3 vessel CAD, mildly depressed LV systolic  fx, 14% left renal artery stenosis  . CAROTID ENDARTERECTOMY  12/1997   [facility])   Bilateral ICA  . CAROTID STENT INSERTION Bilateral dr berry and dr Kristine Royal--  (In-stent Restenosis)  LeftICA   02-12-2011;  [facility]   02-26-2011  .  CORONARY ARTERY BYPASS GRAFT  2/ 2004   dr hendrickson   x3 LIMA to LAD,free right internal mammary artery to obtuse marginal 1,SVG to distal right coronary.  . CYSTOSCOPY W/ RETROGRADES Bilateral 09/10/2015   Procedure: CYSTOSCOPY WITH RETROGRADE PYELOGRAM;  Surgeon: Carolan Clines, MD;  Location: [facility];  Service: Urology;  Laterality: Bilateral;  . CYSTOSCOPY WITH BIOPSY N/A 09/10/2015   Procedure: CYSTOSCOPY WITH BIOPSY,RIGHT TRIGONAL TUMOR 1.5 CM, EXCISIONAL BIOPSY WITH TAUBER FORCEP RIGHT POSTERIOR BLADDER WALL 1 CM, EXCISIONAL BIOPSY SATELITE BLADDER WALL 1 CM;  Surgeon: Carolan Clines, MD;  Location: [facility];  Service: Urology;  Laterality: N/A;  . CYSTOSCOPY WITH BIOPSY N/A 03/26/2017   Procedure: CYSTOSCOPY WITH BIOPSY/;  Surgeon: Carolan Clines, MD;  Location: [facility];  Service: Urology;  Laterality: N/A;  . CYSTOSCOPY WITH HYDRODISTENSION AND BIOPSY N/A 10/29/2015   Procedure: REPEAT CYSTOSCOPY BIOPSY BLADDER DEEP MUSCLES BLADDER BIOPSY;  Surgeon: Carolan Clines, MD;  Location: [facility];  Service: Urology;  Laterality: N/A;  . CYSTOSCOPY WITH URETHRAL DILATATION N/A 09/10/2015   Procedure: CYSTOSCOPY WITH URETHRAL DILATATION;  Surgeon: Carolan Clines, MD;  Location: [place];  Service: Urology;  Laterality: N/A;  . FULGURATION OF BLADDER TUMOR N/A 03/26/2017   Procedure: FULGURATION OF BLADDER TUMOR;  Surgeon: Carolan Clines, MD;  Location: [facility];  Service: Urology;  Laterality: N/A;  . [place]  . MOHS SURGERY  06-20-2015   side of nose  . NM MYOCAR PERF WALL MOTION  01/09/2011   dr berry   mild to mod. perfusion defect in the basal inferolateral & mid inferolaterl regions consistant with an infarct/scar, no signigicant ischemia demonstracted/  Low Risk scan, no sig. change from previous study/  normal LV function and wall motion , ef 63%  . ORIF RIGHT ANKLE FX   1998   hardware retained  . TRANSURETHRAL RESECTION OF BLADDER TUMOR N/A 09/10/2015   Procedure: CYSTO TRANSURETHRAL RESECTION OF BLADDER TUMOR (TURBT) OF LEFT POSTERIOR BLADDER TUMOR 2 CM;  Surgeon: Carolan Clines, MD;  Location: [facility];  Service: Urology;  Laterality: N/A;  . TRANSURETHRAL RESECTION OF BLADDER TUMOR Left 04/17/2016   Procedure: TRANSURETHRAL RESECTION OF BLADDER TUMOR (TURBT);  Surgeon: Carolan Clines, MD;  Location: [facility];  Service: Urology;  Laterality: Left;  . US ECHOCARDIOGRAPHY  01/03/2011   normal LVF, ef>55%/  mild LAE/ trace MR and TR,  mild AV sclerosis without stenosis        Home Medications    Prior to Admission medications   Medication Sig Start Date End Date Taking? Authorizing Provider  amLODipine (NORVASC) 10 MG tablet Take 1 tablet (10 mg total) by mouth daily. 09/14/18 09/03/20  Erlene Quan, PA-C  carbidopa-levodopa (SINEMET IR) 25-100 MG tablet TAKE TWO TABLETS BY MOUTH THREE TIMES A DAY 01/05/19   Penumalli, Earlean Polka, MD  cholecalciferol (VITAMIN D) 1000 units tablet Take 2,000 Units by mouth daily with breakfast.     [provider]  clindamycin (CLEOCIN) 300 MG capsule Take 1 capsule (300 mg total) by mouth 3 (three) times daily. 02/02/19   Alanya Vukelich, Dellis Filbert, PA-C  Coenzyme Q10 (COQ-10) 50 MG CAPS Take by mouth 2 (two) times daily.     [provider]  dantrolene (DANTRIUM) 50 MG capsule TAKE ONE CAPSULE BY MOUTH TWICE A DAY 09/22/18   Meredith Staggers, MD  dipyridamole-aspirin (AGGRENOX) 200-25 MG 12hr capsule TAKE ONE CAPSULE BY MOUTH TWICE A DAY 06/21/18   Penumalli, Earlean Polka, MD  ezetimibe (ZETIA) 10 MG tablet TAKE ONE TABLET BY MOUTH DAILY 12/27/18   Lorretta Harp, MD  fenofibrate (TRICOR) 48 MG tablet TAKE 1 TABLET BY MOUTH DAILY 11/03/18   Lorretta Harp, MD  Flaxseed, Linseed, 1000 MG CAPS Take 1,000 mg by mouth every evening.     [provider]  folic acid-pyridoxine-cyancobalamin  (FOLBIC) 2.5-25-2 MG TABS tablet Take 1 tablet by mouth daily. with food 10/01/18   Penumalli, Earlean Polka, MD  insulin lispro (HUMALOG KWIKPEN) 100 UNIT/ML KiwkPen Inject 4 Units into the skin See admin instructions. Takes 3 times daily before meals for sugar over 200 only    [provider]  KEPPRA 500 MG tablet Take 1 tablet (500 mg total) by mouth 2 (two) times daily. 05/05/18   Penumalli, Earlean Polka, MD  metoprolol tartrate (LOPRESSOR) 25 MG tablet TAKE ONE TABLET BY MOUTH DAILY (KEEP OFFICE VISIT) 11/03/18   Lorretta Harp, MD  nitroGLYCERIN (NITROSTAT) 0.4 MG SL tablet Place 1 tablet (0.4 mg total) under the tongue every 5 (five) minutes as needed for chest pain. 10/19/17   Lorretta Harp, MD  olmesartan (BENICAR) 20 MG tablet Take 1 tablet (20 mg total) by mouth daily. 12/09/18   Lorretta Harp, MD  [facility] DELICA LANCETS 46F [place]  07/29/17   [provider]  [facility] VERIO test strip  08/16/17   [provider]  RAPAFLO 8 MG CAPS capsule Take 8 mg by mouth daily with breakfast.  04/22/17   [provider]  rosuvastatin (CRESTOR) 20 MG tablet TAKE ONE AND ONE-HALF (1.5) TABLETS BY MOUTH DAILY 11/11/18   Lorretta Harp, MD    Family History Family History  Problem Relation Age of Onset  . Stroke Father   . Heart attack Father   . Stroke Mother   . Diabetes Brother   . Hyperlipidemia Brother   . Hypertension Brother   . Hyperlipidemia Sister   . Hypertension Sister     Social History Social History   Tobacco Use  . Smoking status: Former Smoker    Years: 5.00    Types: Cigarettes    Last attempt to quit: 11/14/2007    Years since quitting: 11.2  . Smokeless tobacco: Never Used  Substance Use Topics  . Alcohol use: No  . Drug use: No     Allergies   Altace [ramipril]   Review of Systems Review of Systems  All other systems reviewed and are negative.    Physical Exam Updated Vital Signs BP (!) 152/60 (BP Location: Right Arm)    Pulse 60   Temp 97.9 F (36.6 C) (Oral)   Resp 14   SpO2 99%   Physical Exam Vitals signs  and nursing note reviewed.  Constitutional:      Appearance: He is well-developed.  HENT:     Head: Normocephalic and atraumatic.  Eyes:     General: No scleral icterus.       Right eye: No discharge.        Left eye: No discharge.     Conjunctiva/sclera: Conjunctivae normal.     Pupils: Pupils are equal, round, and reactive to light.  Neck:     Musculoskeletal: Normal range of motion.     Vascular: No JVD.     Trachea: No tracheal deviation.  Pulmonary:     Effort: Pulmonary effort is normal.     Breath sounds: No stridor.  Genitourinary:    Comments: Small ruptured abscess to the left groin minimal surrounding erythema, no surrounding induration, testicles then penis without involvement nontender Neurological:     Mental Status: He is alert and oriented to person, place, and time.     Coordination: Coordination normal.  Psychiatric:        Behavior: Behavior normal.        Thought Content: Thought content normal.        Judgment: Judgment normal.      ED Treatments / Results  Labs (all labs ordered are listed, but only abnormal results are displayed) Labs Reviewed - No data to display  EKG None  Radiology No results found.  Procedures Procedures (including critical care time)  Medications Ordered in ED Medications - No data to display   Initial Impression / Assessment and Plan / ED Course  I have reviewed the triage vital signs and the nursing notes.  Pertinent labs & imaging results that were available during my care of the patient were reviewed by me and considered in my medical decision making (see chart for details).        73 year old male presents today with infection in his left groin.  This appears to be a ruptured abscess.  He has no significant surrounding induration or signs of deep space infection.  Patient also has minor erythema, question  cellulitic process versus more likely fungal.  He has no signs of spreading or deep space infection at this time.  He is afebrile well-appearing in no acute distress.  Patient's blood sugar has been well controlled at home, continue monitoring at home.  I do find he is stable for outpatient management.  Both him and his caretaker understand return precautions and warning signs.  He verbalized understanding and agreement to today's plan had no further questions or concerns at time of discharge.  Final Clinical Impressions(s) / ED Diagnoses   Final diagnoses:  Abscess    ED Discharge Orders         Ordered    clindamycin (CLEOCIN) 300 MG capsule  3 times daily     02/02/19 1216           Okey Regal, PA-C 02/02/19 1219    Lennice Sites, DO 02/02/19 1223    Ronnald Nian, Aneta, DO 02/02/19 1442

## 2019-02-02 NOTE — Discharge Instructions (Addendum)
Please read the attached information.  If your symptoms do not improve in the next 48 hours he is return to the emergency room for repeat evaluation.  If develop any new or worsening signs or symptoms return to the emergency room.  Please follow-up with your primary care provider if symptoms persist.  Please continue to monitor your blood sugar.

## 2019-02-14 ENCOUNTER — Other Ambulatory Visit: Payer: Self-pay

## 2019-02-14 MED ORDER — ROSUVASTATIN CALCIUM 20 MG PO TABS: ORAL_TABLET | ORAL | 2 refills | Status: DC

## 2019-02-17 ENCOUNTER — Other Ambulatory Visit: Payer: Self-pay

## 2019-02-17 MED ORDER — ROSUVASTATIN CALCIUM 20 MG PO TABS: 20.0000 mg | ORAL_TABLET | Freq: Every day | ORAL | 2 refills | Status: DC

## 2019-02-21 ENCOUNTER — Telehealth: Payer: Self-pay | Admitting: Cardiovascular Disease

## 2019-02-21 NOTE — Telephone Encounter (Signed)
Returned call to patient's wife.She stated directions on crestor are wrong.Stated husband takes crestor 20 mg daily.Bottle from 12/19 says 20 mg take 1&1/2 daily.Advised crestor refill that was sent in 02/17/19 says 20 mg daily.Stated he had lipid panel done recently with oncology Dr.She cannot remember his name.She will have lipid panel faxed to office for Dr.Berry to review.Advised pt should be taking Crestor 20 mg daily.

## 2019-02-21 NOTE — Telephone Encounter (Signed)
New message    Pt c/o medication issue:  1. Name of Medication: rosuvastatin (CRESTOR) 20 MG tablet  2. How are you currently taking this medication (dosage and times per day)?1 time daily  3. Are you having a reaction (difficulty breathing--STAT)? N/a  4. What is your medication issue? Patient's caregiver states that there is a discrepancy in the dosage. Please call to discuss.

## 2019-04-13 ENCOUNTER — Other Ambulatory Visit: Payer: Self-pay | Admitting: Diagnostic Neuroimaging

## 2019-04-21 ENCOUNTER — Telehealth: Payer: Self-pay | Admitting: Cardiology

## 2019-04-21 ENCOUNTER — Ambulatory Visit (INDEPENDENT_AMBULATORY_CARE_PROVIDER_SITE_OTHER): Payer: Medicare Other | Admitting: Cardiology

## 2019-04-21 ENCOUNTER — Other Ambulatory Visit: Payer: Self-pay

## 2019-04-21 ENCOUNTER — Encounter: Payer: Self-pay | Admitting: Cardiology

## 2019-04-21 VITALS — BP 150/68 | HR 60 | Temp 97.0°F | Ht 64.0 in | Wt 157.2 lb

## 2019-04-21 DIAGNOSIS — Z8673 Personal history of transient ischemic attack (TIA), and cerebral infarction without residual deficits: Secondary | ICD-10-CM | POA: Diagnosis not present

## 2019-04-21 DIAGNOSIS — I6523 Occlusion and stenosis of bilateral carotid arteries: Secondary | ICD-10-CM

## 2019-04-21 DIAGNOSIS — G20 Parkinson's disease: Secondary | ICD-10-CM

## 2019-04-21 DIAGNOSIS — G20A1 Parkinson's disease without dyskinesia, without mention of fluctuations: Secondary | ICD-10-CM

## 2019-04-21 DIAGNOSIS — Z951 Presence of aortocoronary bypass graft: Secondary | ICD-10-CM

## 2019-04-21 DIAGNOSIS — I10 Essential (primary) hypertension: Secondary | ICD-10-CM

## 2019-04-21 DIAGNOSIS — N183 Chronic kidney disease, stage 3 unspecified: Secondary | ICD-10-CM | POA: Insufficient documentation

## 2019-04-21 DIAGNOSIS — N2889 Other specified disorders of kidney and ureter: Secondary | ICD-10-CM

## 2019-04-21 DIAGNOSIS — E785 Hyperlipidemia, unspecified: Secondary | ICD-10-CM

## 2019-04-21 NOTE — Assessment & Plan Note (Signed)
Pt was placed on Amlodipine in Dec-this worked well but the patient's wife thinks her husband has a rash and attributes it to the Amlodipine.

## 2019-04-21 NOTE — Assessment & Plan Note (Signed)
Trocor, Zetia, Crestor 20 mg

## 2019-04-21 NOTE — Patient Instructions (Signed)
Medication Instructions:  STOP Norvasc  If you need a refill on your cardiac medications before your next appointment, please call your pharmacy.   Lab work: None  If you have labs (blood work) drawn today and your tests are completely normal, you will receive your results only by: Marland Kitchen MyChart Message (if you have MyChart) OR . A paper copy in the mail If you have any lab test that is abnormal or we need to change your treatment, we will call you to review the results.  Testing/Procedures: None   Follow-Up: At [facility], you and your health needs are our priority.  As part of our continuing mission to provide you with exceptional heart care, we have created designated Provider Care Teams.  These Care Teams include your primary Cardiologist (physician) and Advanced Practice Providers (APPs -  Physician Assistants and Nurse Practitioners) who all work together to provide you with the care you need, when you need it. . Follow up will be determined by how the patient is doing with stopping the Norvasc  Any Other Special Instructions Will Be Listed Below (If Applicable). CALL THE OFFICE NEXT WEEK TO GIVE A UPDATE WITH RASH AND BLOOD PRESSURE

## 2019-04-21 NOTE — Progress Notes (Signed)
Cardiology Office Note:    Date:  04/21/2019   ID:  [name], DOB Oct 25, 1945, MRN [id number]  PCP:  [name], MD  Cardiologist:  Dr Gwenlyn Found Electrophysiologist:  None   Referring MD: [name], MD   Rash  History of Present Illness:    [name] is a 73 y.o. male with a hx of CAD, PVD, HTN, andParkinson's disease. He had remote bypass grafting in 2004. Myoview in 2012 was low risk and an echocardiogram in 2012 showed normal LV function. He has not had problems with angina or heart failure. He has carotid artery disease and had remote bilateral carotid endarterectomy in 1999. He developed restenosis in 2012 and underwent staged carotid stenting by Dr. Gwenlyn Found and Dr. Trula Slade. The patient has Parkinson's disease and has had a remote stroke with left sided paralysis. He is followed by Dr. Stark Klein.He has a seizure disorder and a few months ago was tapered off Dilantin as he has not had a seizure in some years.    He was seen in the office 08/18/2018 with a recent increase in his blood pressure.  His Benicar had been increased as an outpatient from 20 mg to 40 mg.  When I saw him his blood pressure was 180/72.  For some reason he is been taking his Benicar at noon and his metoprolol at bedtime.  I added amlodipine 5 mg.  A BM P was obtained which showed a bump in his BUN and creatinine and his Benicar was cut back to his previous dose of 20 mg based on this.   Past Medical History:  Diagnosis Date  . At high risk for falls   . Bladder cancer [facility])    urologist-  dr Gaynelle Arabian  . CAD (coronary artery disease) cardiologist-  dr berry  . Cerebrovascular arteriosclerosis   . Gait disturbance, post-stroke    uses cane, rollator, and w/c long distance  . Hemiparkinsonism [facility]) neurologist-- dr Leta Baptist   right side body  . History of basal cell carcinoma excision    Sept 2016--  MOH's surgery side of nose  . History of carotid artery stenosis cardiologist-- dr  berry   bilateral --  1999 s/p  bilateral ICA endarterectomy and recurrent restenosis 2012  s/p  staged bilateral stenting   per last duplex 2015  stents widely patent  . History of CVA with residual deficit neurologist-  dr Leta Baptist   3/ 1999  Right MCA  and  12/ 2004  Anterior division of  Right MCA ---  residual left spactic hemiparesis and left foot drop  . History of MI (myocardial infarction)    02/ 2004   s/p  cabg   . HOH (hard of hearing)    LEFT EAR  POST cva  . Hyperextension deformity of left knee    wears brace  . Hyperlipidemia   . Hypertension   . Left foot drop    residual from CVA  -- wears brace  . Left spastic hemiparesis ([place])    residual CVA 1999  . Pancreatic pseudocyst   . Peripheral arterial disease ([place])   . S/P CABG x 4    02/ 2004  . Seizure disorder [facility]) last seizure 2011 due to confusion   neurologist-  dr Leta Baptist-- per note seizure documented 2008 breakthrough partial complex seizure (prior tonic-clonic seizure's post CVA)  . Type 2 diabetes, diet controlled ([place])     2  . Visual neglect    LEFT EYE  Past Surgical History:  Procedure Laterality Date  . CARDIAC CATHETERIZATION  11/17/2002   significant left main disease and 3 vessel CAD, mildly depressed LV systolic  fx, 62% left renal artery stenosis  . CAROTID ENDARTERECTOMY  12/1997   [facility])   Bilateral ICA  . CAROTID STENT INSERTION Bilateral dr berry and dr Kristine Royal--  (In-stent Restenosis)  LeftICA   02-12-2011;  [place]   02-26-2011  . CORONARY ARTERY BYPASS GRAFT  2/ 2004   dr hendrickson   x3 LIMA to LAD,free right internal mammary artery to obtuse marginal 1,SVG to distal right coronary.  . CYSTOSCOPY W/ RETROGRADES Bilateral 09/10/2015   Procedure: CYSTOSCOPY WITH RETROGRADE PYELOGRAM;  Surgeon: Carolan Clines, MD;  [facility];  Service: Urology;  Laterality: Bilateral;  . CYSTOSCOPY WITH BIOPSY N/A 09/10/2015   Procedure: CYSTOSCOPY WITH  BIOPSY,RIGHT TRIGONAL TUMOR 1.5 CM, EXCISIONAL BIOPSY WITH TAUBER FORCEP RIGHT POSTERIOR BLADDER WALL 1 CM, EXCISIONAL BIOPSY SATELITE BLADDER WALL 1 CM;  Surgeon: Carolan Clines, MD;  Location: [facility];  Service: Urology;  Laterality: N/A;  . CYSTOSCOPY WITH BIOPSY N/A 03/26/2017   Procedure: CYSTOSCOPY WITH BIOPSY/;  Surgeon: Carolan Clines, MD;  Location: [facility];  Service: Urology;  Laterality: N/A;  . CYSTOSCOPY WITH HYDRODISTENSION AND BIOPSY N/A 10/29/2015   Procedure: REPEAT CYSTOSCOPY BIOPSY BLADDER DEEP MUSCLES BLADDER BIOPSY;  Surgeon: Carolan Clines, MD;  Location: [place];  Service: Urology;  Laterality: N/A;  . CYSTOSCOPY WITH URETHRAL DILATATION N/A 09/10/2015   Procedure: CYSTOSCOPY WITH URETHRAL DILATATION;  Surgeon: Carolan Clines, MD;  Location: [place];  Service: Urology;  Laterality: N/A;  . FULGURATION OF BLADDER TUMOR N/A 03/26/2017   Procedure: FULGURATION OF BLADDER TUMOR;  Surgeon: Carolan Clines, MD;  Location: [facility];  Service: Urology;  Laterality: N/A;  . [place]  . MOHS SURGERY  06-20-2015   side of nose  . NM MYOCAR PERF WALL MOTION  01/09/2011   dr berry   mild to mod. perfusion defect in the basal inferolateral & mid inferolaterl regions consistant with an infarct/scar, no signigicant ischemia demonstracted/  Low Risk scan, no sig. change from previous study/  normal LV function and wall motion , ef 63%  . ORIF RIGHT ANKLE FX  1998   hardware retained  . TRANSURETHRAL RESECTION OF BLADDER TUMOR N/A 09/10/2015   Procedure: CYSTO TRANSURETHRAL RESECTION OF BLADDER TUMOR (TURBT) OF LEFT POSTERIOR BLADDER TUMOR 2 CM;  Surgeon: Carolan Clines, MD;  Location: [facility];  Service: Urology;  Laterality: N/A;  . TRANSURETHRAL RESECTION OF BLADDER TUMOR Left 04/17/2016   Procedure: TRANSURETHRAL RESECTION OF BLADDER TUMOR (TURBT);  Surgeon: Carolan Clines, MD;  Location[facility];  Service: Urology;  Laterality: Left;  . US ECHOCARDIOGRAPHY  01/03/2011   normal LVF, ef>55%/  mild LAE/ trace MR and TR,  mild AV sclerosis without stenosis    Current Medications: Current Meds  Medication Sig  . carbidopa-levodopa (SINEMET IR) 10-100 MG tablet Take 4 tablets by mouth daily.  . cholecalciferol (VITAMIN D) 1000 units tablet Take 2,000 Units by mouth daily with breakfast.   . Coenzyme Q10 (COQ-10) 50 MG CAPS Take by mouth 2 (two) times daily.   . dantrolene (DANTRIUM) 50 MG capsule TAKE ONE CAPSULE BY MOUTH TWICE A DAY  . dipyridamole-aspirin (AGGRENOX) 200-25 MG 12hr capsule TAKE ONE CAPSULE BY MOUTH TWICE A DAY  . ezetimibe (ZETIA) 10 MG tablet TAKE ONE TABLET  BY MOUTH DAILY  . fenofibrate (TRICOR) 48 MG tablet TAKE 1 TABLET BY MOUTH DAILY  . Flaxseed, Linseed, 1000 MG CAPS Take 1,000 mg by mouth every evening.   Marland Kitchen FOLBIC 2.5-25-2 MG TABS tablet Take 1 tablet by mouth once daily with food  . insulin lispro (HUMALOG KWIKPEN) 100 UNIT/ML KiwkPen Inject 4 Units into the skin See admin instructions. Takes 3 times daily before meals for sugar over 200 only  . KEPPRA 500 MG tablet Take 1 tablet (500 mg total) by mouth 2 (two) times daily.  . metoprolol tartrate (LOPRESSOR) 25 MG tablet TAKE ONE TABLET BY MOUTH DAILY (KEEP OFFICE VISIT)  . nitroGLYCERIN (NITROSTAT) 0.4 MG SL tablet Place 1 tablet (0.4 mg total) under the tongue every 5 (five) minutes as needed for chest pain.  Marland Kitchen olmesartan (BENICAR) 20 MG tablet Take 1 tablet (20 mg total) by mouth daily.  Glory Rosebush DELICA LANCETS 48J MISC   . ONETOUCH VERIO test strip   . RAPAFLO 8 MG CAPS capsule Take 8 mg by mouth daily with breakfast.   . rosuvastatin (CRESTOR) 20 MG tablet Take 1 tablet (20 mg total) by mouth daily.  Nelva Nay SOLOSTAR 300 UNIT/ML SOPN   . [DISCONTINUED] amLODipine (NORVASC) 10 MG tablet Take 1 tablet (10 mg total) by mouth daily.     Allergies:   Altace [ramipril]    Social History   Socioeconomic History  . Marital status: Divorced    Spouse name: Not on file  . Number of children: 4  . Years of education: HS  . Highest education level: Not on file  Occupational History    Employer: RETIRED  Social Needs  . Financial resource strain: Not on file  . Food insecurity    Worry: Not on file    Inability: Not on file  . Transportation needs    Medical: Not on file    Non-medical: Not on file  Tobacco Use  . Smoking status: Former Smoker    Years: 5.00    Types: Cigarettes    Quit date: 11/14/2007    Years since quitting: 11.4  . Smokeless tobacco: Never Used  Substance and Sexual Activity  . Alcohol use: No  . Drug use: No  . Sexual activity: Not on file  Lifestyle  . Physical activity    Days per week: Not on file    Minutes per session: Not on file  . Stress: Not on file  Relationships  . Social Herbalist on phone: Not on file    Gets together: Not on file    Attends religious service: Not on file    Active member of club or organization: Not on file    Attends meetings of clubs or organizations: Not on file    Relationship status: Not on file  Other Topics Concern  . Not on file  Social History Narrative   Patient lives at home with his caregiver/ex-wife.   Caffeine- 2 cups of coffee daily     Family History: The patient's family history includes Diabetes in his brother; Heart attack in his father; Hyperlipidemia in his brother and sister; Hypertension in his brother and sister; Stroke in his father and mother.  ROS:   Please see the history of present illness.     All other systems reviewed and are negative.  EKGs/Labs/Other Studies Reviewed:    The following studies were reviewed today:   EKG:  EKG is ordered today.  The ekg ordered  today demonstrates NSR, HR 60  Recent Labs: 08/18/2018: BUN 40; Creatinine, Ser 1.66; Potassium 5.6; Sodium 141  Recent Lipid Panel    Component Value Date/Time   CHOL  137 10/08/2017 0820   TRIG 149 10/08/2017 0820   HDL 40 10/08/2017 0820   CHOLHDL 3.4 10/08/2017 0820   CHOLHDL 9.1 (H) 10/22/2016 0821   VLDL NOT CALC 10/22/2016 0821   LDLCALC 67 10/08/2017 0820   LDLDIRECT 78 10/22/2016 0821    Physical Exam:    VS:  BP (!) 150/68   Pulse 60   Temp (!) 97 F (36.1 C)   Ht 5\' 4"  (1.626 m)   Wt 157 lb 3.2 oz (71.3 kg)   BMI 26.98 kg/m     Wt Readings from Last 3 Encounters:  04/21/19 157 lb 3.2 oz (71.3 kg)  10/22/18 154 lb 6.4 oz (70 kg)  09/14/18 153 lb (69.4 kg)     GEN: chronically ill appearing male in wheel chair,well  in no acute distress HEENT: Normal NECK: No JVD; No carotid bruits LYMPHATICS: No lymphadenopathy CARDIAC: RRR, no murmurs, rubs, gallops RESPIRATORY:  Clear to auscultation without rales, wheezing or rhonchi  ABDOMEN: Soft, non-tender, non-distended MUSCULOSKELETAL:  No edema; No deformity  SKIN: Warm and dry NEUROLOGIC:  Alert and oriented x 3, Lt hemiparesis PSYCHIATRIC:  Normal affect   ASSESSMENT:    Essential hypertension Pt was placed on Amlodipine in Dec-this worked well but the patient's wife thinks her husband has a rash and attributes it to the Amlodipine.   Parkinson's disease ([place]) Followed by Neurology. Pt is significantly disabled- in a wheel chair  History of stroke Pt has a history of remote Rt brain stroke with left sided paralysis.   Hx of CABG CABG 2004, Myoview low risk 2012 and echo showed normal LVF 2012  CRI (chronic renal insufficiency), stage 3 (moderate) (HCC) SCr worse when we tried to increase his Benicar dose  Hyperlipidemia Trocor, Zetia, Crestor 20 mg  Carotid stenosis Known bilateral carotid disease, s/p bilateral CEA in 1999, followed by staged bilateral CA stenting 2012  PLAN:    I suggested she hold his Norvasc for a few days and see if his rash gets better.  If it does and we think the rash was from Norvasc I think an option for him would be doxazosin.      Medication Adjustments/Labs and Tests Ordered: Current medicines are reviewed at length with the patient today.  Concerns regarding medicines are outlined above.  Orders Placed This Encounter  Procedures  . EKG 12-Lead   No orders of the defined types were placed in this encounter.   Patient Instructions  Medication Instructions:  STOP Norvasc  If you need a refill on your cardiac medications before your next appointment, please call your pharmacy.   Lab work: None  If you have labs (blood work) drawn today and your tests are completely normal, you will receive your results only by: Marland Kitchen MyChart Message (if you have MyChart) OR . A paper copy in the mail If you have any lab test that is abnormal or we need to change your treatment, we will call you to review the results.  Testing/Procedures: None   Follow-Up: At [facility], you and your health needs are our priority.  As part of our continuing mission to provide you with exceptional heart care, we have created designated Provider Care Teams.  These Care Teams include your primary Cardiologist (physician) and Advanced Practice Providers (APPs -  Physician Assistants and Nurse Practitioners) who all work together to provide you with the care you need, when you need it. . Follow up will be determined by how the patient is doing with stopping the Norvasc  Any Other Special Instructions Will Be Listed Below (If Applicable). CALL THE OFFICE NEXT WEEK TO GIVE A UPDATE WITH RASH AND BLOOD PRESSURE     Signed, Kerin Ransom, PA-C  04/21/2019 3:37 PM    [facility]

## 2019-04-21 NOTE — Assessment & Plan Note (Signed)
Followed by Neurology. Pt is significantly disabled- in a wheel chair

## 2019-04-21 NOTE — Assessment & Plan Note (Signed)
CABG 2004, Myoview low risk 2012 and echo showed normal LVF 2012

## 2019-04-21 NOTE — Telephone Encounter (Signed)
I called to confirm pt's appt for 04-21-19 with Kerin Ransom.       COVID-19 Pre-Screening Questions:   In the past 7 to 10 days have you had a cough,  shortness of breath, headache, congestion, fever (100 or greater) body aches, chills, sore throat, or sudden loss of taste or sense of smell? no  Have you been around anyone with known Covid 19. no  Have you been around anyone who is awaiting Covid 19 test results in the past 7 to 10 days? no Have you been around anyone who has been exposed to Covid 19, or has mentioned symptoms of Covid 19 within the past 7 to 10 days? No  If you have any concerns/questions about symptoms patients report during screening (either on the phone or at threshold). Contact the provider seeing the patient or DOD for further guidance.  If neither are available contact a member of the leadership team.

## 2019-04-21 NOTE — Assessment & Plan Note (Signed)
Pt has a history of remote Rt brain stroke with left sided paralysis.

## 2019-04-21 NOTE — Assessment & Plan Note (Signed)
SCr worse when we tried to increase his Benicar dose

## 2019-04-21 NOTE — Assessment & Plan Note (Signed)
Known bilateral carotid disease, s/p bilateral CEA in 1999, followed by staged bilateral CA stenting 2012

## 2019-04-25 ENCOUNTER — Telehealth: Payer: Self-pay | Admitting: Cardiovascular Disease

## 2019-04-25 NOTE — Telephone Encounter (Signed)
  Caregiver is calling to give [name] an update on his condition per his request. She states that his rash is not better but it is not worse. His blood pressure is going up 187/77, 185/77, 200/81 are his most recent readings. She wants to know if his medication could be changed.

## 2019-04-26 MED ORDER — AMLODIPINE BESYLATE 10 MG PO TABS: 10.0000 mg | ORAL_TABLET | Freq: Every day | ORAL | 0 refills | Status: DC

## 2019-04-26 NOTE — Telephone Encounter (Signed)
Called patient's caregiver and informed her to have patient started back on amlodipine 10 mg daily and to keep a record of blood pressures and call back in 7-10 days with the readings. She verbally understood. No further questions.

## 2019-04-26 NOTE — Telephone Encounter (Signed)
Please tell the patient to resume Amlodipine 10 mg since his rash did not change off it.  He should continue to monitor B/P- call back after 7-10 days with B/P readings.  Kerin Ransom PA-C 04/26/2019 8:11 AM

## 2019-05-03 ENCOUNTER — Telehealth: Payer: Self-pay | Admitting: Family Medicine

## 2019-05-03 NOTE — Telephone Encounter (Signed)
I called patient regarding his 7/28 appt and I spoke with his caregiver, [name]. I offered a virtual visit due to the changes that our office has made. Caregiver accepted a virtual visit and provided me with patient's e-mail for doxy visit ([email or web address]). I have e-mailed patient the link and information and verified with them that they received it. Patient verbalized understanding.  Pt understands that although there may be some limitations with this type of visit, we will take all precautions to reduce any security or privacy concerns.  Pt understands that this will be treated like an in office visit and we will file with pt's insurance, and there may be a patient responsible charge related to this service.

## 2019-05-06 ENCOUNTER — Telehealth: Payer: Self-pay | Admitting: Cardiovascular Disease

## 2019-05-06 NOTE — Telephone Encounter (Signed)
Spoke with pt's wife who report she was instructed to have pt resume amlodipine 10 mg daily, keep BP log, then call a week later with readings. Results listed below.  07/14 1:50 pm 171/74 HR 55 07/14 11:30PM 187/80 HR 57 07/15 10:40 am 169/67 HR 55 07/16 9:44 pm 161/72 HR 58 07/17 6:05 pm 140/70 HR 55 (L Arm) 07/18 5:30 pm 177/75 HR 56 07/18 5:36 pm 154/71 HR 53 (L Arm) 07/18 10:00 pm 158/72 HR 56 07/19 10:12 am 160/73 HR 57 07/19 6:05 pm 148/73 HR 57 07/20 3:00 pm 181/72 HR 61 07/21 8:00 pm 153/66 HR 60 (L Arm) 07/22 Error both arms 07/23 1:30 pm 150/72 HR 55 07/24 10:00 am 176/75 HR 57   Wife report, pt has no symptoms but wife is concerned that BP is still too high. Wife also inquiring if pt is ok to use pedal exerciser. Will route to PA.

## 2019-05-06 NOTE — Telephone Encounter (Signed)
° °  All readings are in the R arm unless otherwise documented. The patient has Parkinson's , and paralysis of the L arm and lower blood flow. His caregiver only takes his reading in the L arm if his tremors prevent her from getting a reading in his R arm.   1. What are your last 5 BP readings?      07/14 1:50 pm 171/74 HR 55 07/14 11:30PM 187/80 HR 57 07/15 10:40 am 169/67 HR 55 07/16 9:44 pm 161/72 HR 58 07/17 6:05 pm 140/70 HR 55 (L Arm) 07/18 5:30 pm 177/75 HR 56 07/18 5:36 pm 154/71 HR 53 (L Arm) 07/18 10:00 pm 158/72 HR 56 07/19 10:12 am 160/73 HR 57 07/19 6:05 pm 148/73 HR 57 07/20 3:00 pm 181/72 HR 61 07/21 8:00 pm 153/66 HR 60 (L Arm) 07/22 Error both arms 07/23 1:30 pm 150/72 HR 55 07/24 10:00 am 176/75 HR 57  2. Are you having any other symptoms (ex. Dizziness, headache, blurred vision, passed out)? no  3. What is your BP issue? Caregiver was told last week to adjust the patient's medication and document his BR readings for the next week. She is just calling to report those   His caregiver thinks that the patient is not getting enough medication. She is nervous that his BP is still too high.   She also wants to know if it is OK for him to use a device where he can pedal while sitting in a chair at home. He does not get much exercise now, and his family would like him to get a little bit of exercise.

## 2019-05-09 MED ORDER — HYDRALAZINE HCL 25 MG PO TABS: 25.0000 mg | ORAL_TABLET | Freq: Two times a day (BID) | ORAL | 3 refills | Status: DC

## 2019-05-09 NOTE — Telephone Encounter (Signed)
OK to use pedal exerciser.  Add hydralazine 25 mg BID.   Kerin Ransom PA-C 05/09/2019 9:11 AM

## 2019-05-09 NOTE — Telephone Encounter (Signed)
Wife updated and voiced understanding. New Rx sent to pharmacy.

## 2019-05-10 ENCOUNTER — Ambulatory Visit: Payer: Medicare Other | Admitting: Nurse Practitioner

## 2019-05-10 ENCOUNTER — Telehealth (INDEPENDENT_AMBULATORY_CARE_PROVIDER_SITE_OTHER): Payer: Medicare Other | Admitting: Family Medicine

## 2019-05-10 ENCOUNTER — Other Ambulatory Visit: Payer: Self-pay

## 2019-05-10 DIAGNOSIS — G218 Other secondary parkinsonism: Secondary | ICD-10-CM | POA: Diagnosis not present

## 2019-05-10 DIAGNOSIS — G40909 Epilepsy, unspecified, not intractable, without status epilepticus: Secondary | ICD-10-CM

## 2019-05-10 NOTE — Progress Notes (Signed)
PATIENT: [name] DOB: 1946-07-08  REASON FOR VISIT: follow up HISTORY FROM: patient  Virtual Visit via Telephone Note  I connected with [name] on 05/10/19 at 11:30 AM EDT by telephone and verified that I am speaking with the correct person using two identifiers.   I discussed the limitations, risks, security and privacy concerns of performing an evaluation and management service by telephone and the availability of in person appointments. I also discussed with the patient that there may be a patient responsible charge related to this service. The patient expressed understanding and agreed to proceed.   History of Present Illness:  05/10/19 [name] is a 73 y.o. male here today for follow up for parkinsonism and seizure s/p CVA. He is tolerating Keppra (Brand only) 500mg  BID with no adverse effects noted. Dilantin was weaned and discontinued. Uncertain of last seizure but thinks it was around 2008-2009. He also continues carb/levo 2 tablet in the morning, 1 tablet before lunch and 1 tablet before dinner. He tried weaning to 1 tablet TID but noticed more freezing of right leg and foot resulting in falls. He has had several falls over the past year without injury. Falls have resolved after increasing morning dose of carb/levo. He uses Rolator 90% of the time and a cane as needed. He is feeling well today. He feels that symptoms are well managed. He denies concerns of memory loss. He is exercising daily with a pedal exerciser for at least 12 minutes. He is increasing time as he gets stronger. BP was elevated and has recently started hydralizine 25mg  daily managed by cardiology. He remains of Aggrenox, Crestor, Zetia and fenofibrate.   History (copied form Dr Gladstone Lighter note on 05/05/2018)  UPDATE (05/05/18, VRP): Since last visit, doing wores with balance. Symptoms are progressive. Severity is moderate. No alleviating or aggravating factors. Tolerating meds.  No seizures.   UPDATE 05/04/17: Since last visit, had had confusion and weakness following bladder surgery, lasting 11 days. Now back to baseline. Taking carb/levo 2 tabs TID (2 tabs 7am, 1 tab 11:00, 1 tab 2:30pm, 1 tab 6:30pm, 1 tab 9pm).   UPDATE 11/03/16: Since last visit, voice is more hoarse. More trouble with blood sugar regulation. More falls. New rash on right face and left back.   UPDATE 04/22/16: Since last visit, postural tremor is slightly worse; resting tremor is stable. More freezing with gait initiation. No falls. Had 1 more bladder cancer surgery on 04/17/16.   UPDATE 10/23/15: Since last visit, overall stable; but unfortunately dx'd with bladder CA, s/p resection. Stable on meds. Tremor stable.   UPDATE 04/19/15: Since last visit, doing better. Less choking on saliva. Postural and action tremor stable. Balance stable. Mainly with postural and action tremor.   UPDATE 12/18/14: Since last visit, stable. On carb/levo half tab TID. May have had some improvement in tremor. Still using single point cane. No falls since last visit.   UPDATE 09/13/14: Since last visit, symptoms are stable. Still falling. More problems due to right sided slowness and chronic left hemiparesis. Asking about parkinson's dz meds, diagnosis, treatment, supplements.   UPDATE 03/08/14: Since last visit, no seizures. Has been falling more. Has noticed hand writing deterioration over past 3 years. Also intermittent right hand tremor x 3-6 months (per wife). Patient noted some right hand tremor even in 1999.  UPDATE 03/08/13: Since last visit patient doing well. No seizures since 2005. Patient is stable on Dilantin and Keppra brand name medications. Patient's swallowing has  improved since last visit. He make sure to take liquids following each mouthful of food.  PRIOR HPI (08/27/12, Dr. Erling Cruz): 73 year old right-handed white divorced male from [place], [place] who is cared for by his ex-first wife. He has a known history of  hypertension, right MCA stroke 3/99 associated with residual left hemiparesis, acute right MCA stroke 09/2003,coronary artery disease status post 3 vessel bypass in 2004, bilateral carotid endarterectomies in 1999 and bilateral stent placement 5/12, recurrent TIAs versus seizures, documented seizure 11/12/2006, and Dilantin toxicity. His last MRI of the brain without contrast 11/13/2006 showed a large chronic right middle artery infarct involving the posterior temporal and parietal lobe and right thalamus. MR angiogram of the head without contrast showed intracranial atherosclerotic disease and occlusion of the right middle cerebral artery branch corresponding to the right MCA infarct. There was mild atherosclerotic vascular disease elsewhere and no aneurysm. Doppler study of the carotids 12/16/07 was negative for hemodynamically significant stenosis in the intracranial arteries.  EEG 11/13/2006 showed rhythmic discharges correlating with the EKG and were thought to be artifact. . His last seizure was 11/18/2006. He subsequently had one episode of confusion 11/26/2009 which might have been a seizure. He was at his son's house, came home, looked in the  driveway, and asked where his car was. His car had been given to someone else in the family. He kept asking about the car and recognized that he was confused. The episode lasted 40 minutes. The patient lives with his caretaker. He is able to bathe himself and  take care of his toileting needs, but requires assistance in dressing. He  exercises at the [facility] 2 days per week. He has falls with a fall assessment tool score of 10. He denies macropsia, micropsia, strange odors or tastes. His caretaker reports that at times he has left knee hyperextension. He uses a left leg brace. He denies TIA symptoms or warnings of seizures.He has finished 2 months of PT in no rehabilitation and has a new left foot brace, which is not associated with hyperextension of his knee. He denies spasticity  or spasms. He enjoys playing "Wii" 2 times per day for exercise.His wife reports that he chokes on his saliva. He has not had  unexplained fever or chills. Last trough levetiracetam level 15.9  and phenytoin level 13.4 on 03/11/2012.   Observations/Objective:  Generalized: Well developed, in no acute distress  Mentation: Alert oriented to time, place, history taking. Follows all commands speech and language fluent   Assessment and Plan:  73 y.o. year old male  has a past medical history of At high risk for falls, Bladder cancer ([place]), CAD (coronary artery disease) (cardiologist-  dr berry), Cerebrovascular arteriosclerosis, Gait disturbance, post-stroke, Hemiparkinsonism ([place]) (neurologist-- dr Leta Baptist), History of basal cell carcinoma excision, History of carotid artery stenosis (cardiologist-- dr berry), History of CVA with residual deficit (neurologist-  dr Leta Baptist), History of MI (myocardial infarction), HOH (hard of hearing), Hyperextension deformity of left knee, Hyperlipidemia, Hypertension, Left foot drop, Left spastic hemiparesis ([place]), Pancreatic pseudocyst, Peripheral arterial disease ([place]), S/P CABG x 4, Seizure disorder ([place]) (last seizure 2011 due to confusion), Type 2 diabetes, diet controlled ([place]), and Visual neglect. here with    ICD-10-CM   1. Other secondary parkinsonism ([place])  G21.8   2. Seizure disorder ([place])  G40.909     [place] is doing well today and symptoms are stable. We will continue Keppra (Brand) 500mg  twice daily as well as carbadopa/levadopa 2 tablets in the morning, 1  at lunch and 1 at dinner. Fall and seizure precautions advised. He will continue exercise regimen with pedal exerciser. Close follow up with PCP, pain/rehab and cardiology advised. Annual follow up with [place], sooner if needed. He and his wife verbalize understanding and agreement with plan.   No orders of the defined types were placed in this encounter.   No orders of the defined types were placed  in this encounter.    Follow Up Instructions:  I discussed the assessment and treatment plan with the patient. The patient was provided an opportunity to ask questions and all were answered. The patient agreed with the plan and demonstrated an understanding of the instructions.   The patient was advised to call back or seek an in-person evaluation if the symptoms worsen or if the condition fails to improve as anticipated.  I provided 20 minutes of non-face-to-face time during this encounter. He and his ex wife are located at their place of residence during doxy.me visit. Provider is in the office. Maryelizabeth Kaufmann, CMA helped to facilitate visit.    Debbora Presto, NP

## 2019-05-12 NOTE — Progress Notes (Signed)
I reviewed note and agree with plan.   Penni Bombard, MD 7/53/0051, 1:02 PM Certified in Neurology, Neurophysiology and Neuroimaging  [facility] [address] [place], [place] [address] [phone]

## 2019-06-07 ENCOUNTER — Telehealth: Payer: Self-pay | Admitting: Cardiology

## 2019-06-07 NOTE — Telephone Encounter (Signed)
New Message     Pts wife is calling to leave the BP readings    08/01 134/68 hr 70  08/01 160/69 hr 58  08/03 146/63 hr 58  08/04 (after exercise) 157/62 hr 56  08/05 147/68 hr 63  08/09 162/73 hr 60  08/11 160/72 hr 57  08/14 134/61 hr 56  ( at [facility] ) 08/17 153/73 hr 58  08/18 158/72 hr 56  08/19 161/74 hr 76    Pts wife said medication name Hydralazine, it says it needs to be used with a beta blocker or dieretic and she says he doesn't think he is on any of those  She said he has had a rash since January    Please call

## 2019-06-07 NOTE — Telephone Encounter (Signed)
Increase Hydralazine to 25 mg TID- arrange follow up with Dr Gwenlyn Found 6-8 weeks  Kerin Ransom PA-C 06/07/2019 1:04 PM

## 2019-06-07 NOTE — Telephone Encounter (Signed)
Returned call to [employer]) she will start new dosing of hydralazine TID and continue to log BP and take to upcoming appt with Dr Gwenlyn Found. Verbalizes that she will CB if BP is abnormal or any new sx

## 2019-06-07 NOTE — Telephone Encounter (Signed)
Returned call to wife she sates that Lurena Joiner wanted her to take BP to see how pt was doing was doing with new medication-hydralazine. Informed BB metoprolol.

## 2019-06-22 ENCOUNTER — Other Ambulatory Visit: Payer: Self-pay | Admitting: Cardiovascular Disease

## 2019-06-25 ENCOUNTER — Other Ambulatory Visit: Payer: Self-pay | Admitting: Diagnostic Neuroimaging

## 2019-06-29 ENCOUNTER — Other Ambulatory Visit: Payer: Self-pay | Admitting: Physical Medicine & Rehabilitation

## 2019-07-13 ENCOUNTER — Telehealth: Payer: Self-pay | Admitting: *Deleted

## 2019-07-13 ENCOUNTER — Other Ambulatory Visit: Payer: Self-pay | Admitting: Family Medicine

## 2019-07-13 MED ORDER — CARBIDOPA-LEVODOPA 25-100 MG PO TABS: ORAL_TABLET | ORAL | 3 refills | Status: DC

## 2019-07-13 NOTE — Telephone Encounter (Signed)
Done

## 2019-07-13 NOTE — Telephone Encounter (Signed)
Called patient back, spoke with [name], caregiver who stated he's taking carb/levo 25-100 mg, 2 tabs in am, 1 tab after lunch, and 1 tab after supper.

## 2019-07-13 NOTE — Telephone Encounter (Signed)
Received fax from [facility] Oppenheim asking for new Rx for carb/levo 25-100 mg. Per patient's EMR he is on carb/levo 10-100 mg IR. Called patient who stated he doesn't know. He stated his caregiver will call me back when she returns.

## 2019-07-15 ENCOUNTER — Other Ambulatory Visit: Payer: Self-pay

## 2019-07-18 MED ORDER — HYDRALAZINE HCL 25 MG PO TABS: 25.0000 mg | ORAL_TABLET | Freq: Three times a day (TID) | ORAL | 3 refills | Status: DC

## 2019-07-27 ENCOUNTER — Other Ambulatory Visit: Payer: Self-pay | Admitting: Diagnostic Neuroimaging

## 2019-07-28 ENCOUNTER — Other Ambulatory Visit: Payer: Self-pay | Admitting: Cardiovascular Disease

## 2019-08-02 ENCOUNTER — Other Ambulatory Visit: Payer: Self-pay

## 2019-08-02 ENCOUNTER — Encounter: Payer: Self-pay | Admitting: Cardiovascular Disease

## 2019-08-02 ENCOUNTER — Ambulatory Visit: Payer: Medicare Other | Admitting: Cardiovascular Disease

## 2019-08-02 DIAGNOSIS — I10 Essential (primary) hypertension: Secondary | ICD-10-CM | POA: Diagnosis not present

## 2019-08-02 DIAGNOSIS — Z951 Presence of aortocoronary bypass graft: Secondary | ICD-10-CM | POA: Diagnosis not present

## 2019-08-02 DIAGNOSIS — I6523 Occlusion and stenosis of bilateral carotid arteries: Secondary | ICD-10-CM | POA: Diagnosis not present

## 2019-08-02 DIAGNOSIS — E782 Mixed hyperlipidemia: Secondary | ICD-10-CM

## 2019-08-02 NOTE — Assessment & Plan Note (Signed)
History of CAD status post CABG back in 2004 by Dr. Roxan Hockey.  He denies chest pain or shortness of breath.  His last Myoview stress test performed 01/09/2011 was nonischemic.

## 2019-08-02 NOTE — Assessment & Plan Note (Signed)
History of hyperlipidemia on statin therapy with lipid profile performed 04/01/2019 revealing total cholesterol 104, LDL 42 and HDL 34.

## 2019-08-02 NOTE — Assessment & Plan Note (Signed)
History of essential hypertension with blood pressure measured today 146/54.  He is on hydralazine, amlodipine, metoprolol and Benicar.

## 2019-08-02 NOTE — Progress Notes (Signed)
08/02/2019 [name]   August 30, 1946  [id number]  Primary Physician Luis Jordan, MD Primary Cardiologist: Luis Harp MD [name], [place]  HPI:  [name] is a 73 y.o.  mildly overweight but was Caucasian male father of 4 children grandfather of 8 grandchildren who is accompanied by his first ex-wife [name]  who is his caregiver. I last saw him in the office  10/22/2018. His primary care physician is Dr. Jonathon [name]. He he is a retired Administrator. His cardiac risk factor profile was positive for 75 pack years of tobacco smoking having quit in 2009. He is treated hyperlipidemia. He did have bilateral carotid endarterectomies back in 1999 a sister with a stroke that caused left hemiparesis and paresthesias. He had bilateral carotid stenting performed by Dr. Trula [name] and myself in a staged fashion because of restenosis back in 2012. He also clearly has Parkinson's disease which has been progressive over the last year now requiring him to ambulate with the aid of a walker.. He had coronary bypass grafting in 2004 by Dr. Merilynn [name] and a Myoview stress test performed 01/09/11 was nonischemic. He denies chest pain or shortness of breath. He has been diagnosed with bladder cancer and is currently being treated by [name]. He has had 4 bladder surgeries and treatment with BCG.. Addition, he was recently diagnosed with Parkinson's disease and is wheelchair-bound.  Since I saw him in the office 9 months ago he continues to do well.  He is sheltering in place and socially distancing.  He is minimally ambulatory and uses a walker at home.  He does do recumbent bicycle for exercise.  Carotid Dopplers performed in January of this year revealed his stents to be widely patent.  He denies chest pain or shortness of breath.    Current Meds  Medication Sig  . carbidopa-levodopa (SINEMET IR) 25-100 MG tablet Two tablets in the am, 1 tablet at lunch and 1  tablet in pm.  . cholecalciferol (VITAMIN D) 1000 units tablet Take 2,000 Units by mouth daily with breakfast.   . Coenzyme Q10 (COQ-10) 50 MG CAPS Take by mouth 2 (two) times daily.   . dantrolene (DANTRIUM) 50 MG capsule TAKE ONE CAPSULE BY MOUTH TWICE A DAY  . dipyridamole-aspirin (AGGRENOX) 200-25 MG 12hr capsule TAKE ONE CAPSULE BY MOUTH TWICE A DAY  . ezetimibe (ZETIA) 10 MG tablet TAKE ONE TABLET BY MOUTH DAILY  . fenofibrate (TRICOR) 48 MG tablet TAKE ONE TABLET BY MOUTH DAILY  . Flaxseed, Linseed, 1000 MG CAPS Take 1,000 mg by mouth every evening.   Marland Kitchen FOLBIC 2.5-25-2 MG TABS tablet Take 1 tablet by mouth once daily with food  . hydrALAZINE (APRESOLINE) 25 MG tablet Take 1 tablet (25 mg total) by mouth 3 (three) times daily.  . insulin lispro (HUMALOG KWIKPEN) 100 UNIT/ML KiwkPen Inject 4 Units into the skin See admin instructions. Takes 3 times daily before meals for sugar over 200 only  . KEPPRA 500 MG tablet TAKE ONE TABLET BY MOUTH TWICE A DAY  . metoprolol tartrate (LOPRESSOR) 25 MG tablet TAKE ONE TABLET BY MOUTH DAILY (KEEP OFFICE VISIT)  . nitroGLYCERIN (NITROSTAT) 0.4 MG SL tablet Place 1 tablet (0.4 mg total) under the tongue every 5 (five) minutes as needed for chest pain.  Marland Kitchen olmesartan (BENICAR) 20 MG tablet Take 1 tablet (20 mg total) by mouth daily.  [name] DELICA LANCETS [id number] MISC   . ONETOUCH VERIO test strip   .  RAPAFLO 8 MG CAPS capsule Take 8 mg by mouth daily with breakfast.   . rosuvastatin (CRESTOR) 20 MG tablet Take 1 tablet (20 mg total) by mouth daily.  [name] SOLOSTAR 300 UNIT/ML SOPN      Allergies  Allergen Reactions  . Altace [Ramipril] Cough    Social History   Socioeconomic History  . Marital status: Divorced    Spouse name: Not on file  . Number of children: 4  . Years of education: HS  . Highest education level: Not on file  Occupational History    Employer: RETIRED  Social Needs  . Financial resource strain: Not on file  . Food  insecurity    Worry: Not on file    Inability: Not on file  . Transportation needs    Medical: Not on file    Non-medical: Not on file  Tobacco Use  . Smoking status: Former Smoker    Years: 5.00    Types: Cigarettes    Quit date: 11/14/2007    Years since quitting: 11.7  . Smokeless tobacco: Never Used  Substance and Sexual Activity  . Alcohol use: No  . Drug use: No  . Sexual activity: Not on file  Lifestyle  . Physical activity    Days per week: Not on file    Minutes per session: Not on file  . Stress: Not on file  Relationships  . Social Herbalist on phone: Not on file    Gets together: Not on file    Attends religious service: Not on file    Active member of club or organization: Not on file    Attends meetings of clubs or organizations: Not on file    Relationship status: Not on file  . Intimate partner violence    Fear of current or ex partner: Not on file    Emotionally abused: Not on file    Physically abused: Not on file    Forced sexual activity: Not on file  Other Topics Concern  . Not on file  Social History Narrative   Patient lives at home with his caregiver/ex-wife.   Caffeine- 2 cups of coffee daily     Review of Systems: General: negative for chills, fever, night sweats or weight changes.  Cardiovascular: negative for chest pain, dyspnea on exertion, edema, orthopnea, palpitations, paroxysmal nocturnal dyspnea or shortness of breath Dermatological: negative for rash Respiratory: negative for cough or wheezing Urologic: negative for hematuria Abdominal: negative for nausea, vomiting, diarrhea, bright red blood per rectum, melena, or hematemesis Neurologic: negative for visual changes, syncope, or dizziness All other systems reviewed and are otherwise negative except as noted above.    Blood pressure (!) 146/54, pulse 62, temperature (!) 97.2 F (36.2 C), height 5\' 4"  (1.626 m), weight 160 lb (72.6 kg).  General appearance: alert and  no distress Neck: no adenopathy, no carotid bruit, no JVD, supple, symmetrical, trachea midline and thyroid not enlarged, symmetric, no tenderness/mass/nodules Lungs: clear to auscultation bilaterally Heart: regular rate and rhythm, S1, S2 normal, no murmur, click, rub or gallop Extremities: extremities normal, atraumatic, no cyanosis or edema Pulses: 2+ and symmetric Skin: Skin color, texture, turgor normal. No rashes or lesions Neurologic: Alert and oriented X 3, normal strength and tone. Normal symmetric reflexes. Normal coordination and gait  EKG not performed today  ASSESSMENT AND PLAN:   Carotid stenosis History of carotid artery disease status post remote bilateral carotid endarterectomies back in 1999 with a stroke causing  left hemiparesis.  He had bilateral carotid stenting performed by myself and Dr. Trula [name] in a staged fashion back in 2012.  Carotid Dopplers performed 11/01/2018 revealed these to be widely patent.  Hx of CABG History of CAD status post CABG back in 2004 by Dr. Roxan Hockey.  He denies chest pain or shortness of breath.  His last Myoview stress test performed 01/09/2011 was nonischemic.  Hyperlipidemia History of hyperlipidemia on statin therapy with lipid profile performed 04/01/2019 revealing total cholesterol 104, LDL 42 and HDL 34.  Essential hypertension History of essential hypertension with blood pressure measured today 146/54.  He is on hydralazine, amlodipine, metoprolol and Benicar.      Luis Harp MD FACP,FACC,FAHA, [place] 08/02/2019 11:06 AM

## 2019-08-02 NOTE — Patient Instructions (Signed)
Medication Instructions:  Your physician recommends that you continue on your current medications as directed. Please refer to the Current Medication list given to you today.  If you need a refill on your cardiac medications before your next appointment, please call your pharmacy.   Lab work: NONE If you have labs (blood work) drawn today and your tests are completely normal, you will receive your results only by: Marland Kitchen MyChart Message (if you have MyChart) OR . A paper copy in the mail If you have any lab test that is abnormal or we need to change your treatment, we will call you to review the results.  Testing/Procedures: NONE  Follow-Up: At [facility], you and your health needs are our priority.  As part of our continuing mission to provide you with exceptional heart care, we have created designated Provider Care Teams.  These Care Teams include your primary Cardiologist (physician) and Advanced Practice Providers (APPs -  Physician Assistants and Nurse Practitioners) who all work together to provide you with the care you need, when you need it. . You will need a follow up appointment in 6 months WITH AN APP AND IN 12 MONTHS with Dr. Quay Burow.  Please call our office 2 months in advance to schedule this/each appointment.  You may see one of the following Advanced Practice Providers on your designated Care Team:   . Kerin Ransom, PA-C . Daleen Snook Kroeger, PA-C . Sande Rives, PA-C ____________________ . Almyra Deforest, PA-C . Fabian Sharp, PA-C . Jory Sims, DNP . Rosaria Ferries, PA-C

## 2019-08-02 NOTE — Assessment & Plan Note (Signed)
History of carotid artery disease status post remote bilateral carotid endarterectomies back in 1999 with a stroke causing left hemiparesis.  He had bilateral carotid stenting performed by myself and Dr. Trula Slade in a staged fashion back in 2012.  Carotid Dopplers performed 11/01/2018 revealed these to be widely patent.

## 2019-08-03 ENCOUNTER — Other Ambulatory Visit: Payer: Self-pay | Admitting: Cardiology

## 2019-08-22 ENCOUNTER — Ambulatory Visit: Payer: Medicare Other | Admitting: Physical Medicine & Rehabilitation

## 2019-08-24 ENCOUNTER — Encounter: Payer: Medicare Other | Admitting: Physical Medicine & Rehabilitation

## 2019-08-26 ENCOUNTER — Inpatient Hospital Stay (HOSPITAL_COMMUNITY): Payer: Medicare Other

## 2019-08-26 ENCOUNTER — Inpatient Hospital Stay (HOSPITAL_COMMUNITY)
Admission: EM | Admit: 2019-08-26 | Discharge: 2019-08-28 | DRG: 853 | Disposition: A | Discharge status: Home / Self Care | Payer: Medicare Other | Attending: Internal Medicine | Admitting: Internal Medicine

## 2019-08-26 ENCOUNTER — Encounter (HOSPITAL_COMMUNITY): Payer: Self-pay | Admitting: Emergency Medicine

## 2019-08-26 ENCOUNTER — Emergency Department (HOSPITAL_COMMUNITY): Payer: Medicare Other

## 2019-08-26 ENCOUNTER — Other Ambulatory Visit: Payer: Self-pay

## 2019-08-26 DIAGNOSIS — G8110 Spastic hemiplegia affecting unspecified side: Secondary | ICD-10-CM | POA: Diagnosis not present

## 2019-08-26 DIAGNOSIS — J9601 Acute respiratory failure with hypoxia: Secondary | ICD-10-CM | POA: Diagnosis present

## 2019-08-26 DIAGNOSIS — I509 Heart failure, unspecified: Secondary | ICD-10-CM | POA: Diagnosis not present

## 2019-08-26 DIAGNOSIS — I5031 Acute diastolic (congestive) heart failure: Secondary | ICD-10-CM | POA: Diagnosis present

## 2019-08-26 DIAGNOSIS — Z23 Encounter for immunization: Secondary | ICD-10-CM | POA: Diagnosis present

## 2019-08-26 DIAGNOSIS — I130 Hypertensive heart and chronic kidney disease with heart failure and stage 1 through stage 4 chronic kidney disease, or unspecified chronic kidney disease: Secondary | ICD-10-CM | POA: Diagnosis present

## 2019-08-26 DIAGNOSIS — M25552 Pain in left hip: Secondary | ICD-10-CM | POA: Diagnosis present

## 2019-08-26 DIAGNOSIS — A419 Sepsis, unspecified organism: Principal | ICD-10-CM

## 2019-08-26 DIAGNOSIS — G20 Parkinson's disease: Secondary | ICD-10-CM | POA: Diagnosis present

## 2019-08-26 DIAGNOSIS — H9190 Unspecified hearing loss, unspecified ear: Secondary | ICD-10-CM | POA: Diagnosis present

## 2019-08-26 DIAGNOSIS — I69354 Hemiplegia and hemiparesis following cerebral infarction affecting left non-dominant side: Secondary | ICD-10-CM

## 2019-08-26 DIAGNOSIS — E1122 Type 2 diabetes mellitus with diabetic chronic kidney disease: Secondary | ICD-10-CM | POA: Diagnosis present

## 2019-08-26 DIAGNOSIS — Z87891 Personal history of nicotine dependence: Secondary | ICD-10-CM

## 2019-08-26 DIAGNOSIS — N179 Acute kidney failure, unspecified: Secondary | ICD-10-CM | POA: Diagnosis present

## 2019-08-26 DIAGNOSIS — E785 Hyperlipidemia, unspecified: Secondary | ICD-10-CM | POA: Diagnosis present

## 2019-08-26 DIAGNOSIS — R569 Unspecified convulsions: Secondary | ICD-10-CM

## 2019-08-26 DIAGNOSIS — I252 Old myocardial infarction: Secondary | ICD-10-CM

## 2019-08-26 DIAGNOSIS — I6930 Unspecified sequelae of cerebral infarction: Secondary | ICD-10-CM

## 2019-08-26 DIAGNOSIS — L0231 Cutaneous abscess of buttock: Secondary | ICD-10-CM | POA: Diagnosis present

## 2019-08-26 DIAGNOSIS — N183 Chronic kidney disease, stage 3 unspecified: Secondary | ICD-10-CM | POA: Diagnosis present

## 2019-08-26 DIAGNOSIS — Z833 Family history of diabetes mellitus: Secondary | ICD-10-CM

## 2019-08-26 DIAGNOSIS — Z85828 Personal history of other malignant neoplasm of skin: Secondary | ICD-10-CM | POA: Diagnosis not present

## 2019-08-26 DIAGNOSIS — Z8249 Family history of ischemic heart disease and other diseases of the circulatory system: Secondary | ICD-10-CM

## 2019-08-26 DIAGNOSIS — I10 Essential (primary) hypertension: Secondary | ICD-10-CM

## 2019-08-26 DIAGNOSIS — I69393 Ataxia following cerebral infarction: Secondary | ICD-10-CM

## 2019-08-26 DIAGNOSIS — Z794 Long term (current) use of insulin: Secondary | ICD-10-CM

## 2019-08-26 DIAGNOSIS — Z951 Presence of aortocoronary bypass graft: Secondary | ICD-10-CM

## 2019-08-26 DIAGNOSIS — E871 Hypo-osmolality and hyponatremia: Secondary | ICD-10-CM | POA: Diagnosis present

## 2019-08-26 DIAGNOSIS — Z8349 Family history of other endocrine, nutritional and metabolic diseases: Secondary | ICD-10-CM

## 2019-08-26 DIAGNOSIS — I672 Cerebral atherosclerosis: Secondary | ICD-10-CM | POA: Diagnosis present

## 2019-08-26 DIAGNOSIS — Z7902 Long term (current) use of antithrombotics/antiplatelets: Secondary | ICD-10-CM

## 2019-08-26 DIAGNOSIS — Z20828 Contact with and (suspected) exposure to other viral communicable diseases: Secondary | ICD-10-CM | POA: Diagnosis present

## 2019-08-26 DIAGNOSIS — D649 Anemia, unspecified: Secondary | ICD-10-CM

## 2019-08-26 DIAGNOSIS — I2510 Atherosclerotic heart disease of native coronary artery without angina pectoris: Secondary | ICD-10-CM | POA: Diagnosis present

## 2019-08-26 DIAGNOSIS — G40209 Localization-related (focal) (partial) symptomatic epilepsy and epileptic syndromes with complex partial seizures, not intractable, without status epilepticus: Secondary | ICD-10-CM | POA: Diagnosis present

## 2019-08-26 DIAGNOSIS — I6529 Occlusion and stenosis of unspecified carotid artery: Secondary | ICD-10-CM | POA: Diagnosis present

## 2019-08-26 DIAGNOSIS — D631 Anemia in chronic kidney disease: Secondary | ICD-10-CM | POA: Diagnosis present

## 2019-08-26 DIAGNOSIS — R6521 Severe sepsis with septic shock: Secondary | ICD-10-CM

## 2019-08-26 DIAGNOSIS — Z8551 Personal history of malignant neoplasm of bladder: Secondary | ICD-10-CM

## 2019-08-26 DIAGNOSIS — R042 Hemoptysis: Secondary | ICD-10-CM

## 2019-08-26 DIAGNOSIS — Z823 Family history of stroke: Secondary | ICD-10-CM

## 2019-08-26 DIAGNOSIS — I340 Nonrheumatic mitral (valve) insufficiency: Secondary | ICD-10-CM | POA: Diagnosis not present

## 2019-08-26 DIAGNOSIS — Z888 Allergy status to other drugs, medicaments and biological substances status: Secondary | ICD-10-CM

## 2019-08-26 LAB — CBC WITH DIFFERENTIAL/PLATELET
Abs Immature Granulocytes: 0.09 10*3/uL — ABNORMAL HIGH (ref 0.00–0.07)
Basophils Absolute: 0.1 10*3/uL (ref 0.0–0.1)
Basophils Relative: 0 %
Eosinophils Absolute: 0.1 10*3/uL (ref 0.0–0.5)
Eosinophils Relative: 1 %
HCT: 32.3 % — ABNORMAL LOW (ref 39.0–52.0)
Hemoglobin: 10.6 g/dL — ABNORMAL LOW (ref 13.0–17.0)
Immature Granulocytes: 1 %
Lymphocytes Relative: 7 %
Lymphs Abs: 0.9 10*3/uL (ref 0.7–4.0)
MCH: 31.9 pg (ref 26.0–34.0)
MCHC: 32.8 g/dL (ref 30.0–36.0)
MCV: 97.3 fL (ref 80.0–100.0)
Monocytes Absolute: 1.9 10*3/uL — ABNORMAL HIGH (ref 0.1–1.0)
Monocytes Relative: 15 %
Neutro Abs: 9.4 10*3/uL — ABNORMAL HIGH (ref 1.7–7.7)
Neutrophils Relative %: 76 %
Platelets: 220 10*3/uL (ref 150–400)
RBC: 3.32 MIL/uL — ABNORMAL LOW (ref 4.22–5.81)
RDW: 12.7 % (ref 11.5–15.5)
WBC: 12.5 10*3/uL — ABNORMAL HIGH (ref 4.0–10.5)
nRBC: 0 % (ref 0.0–0.2)

## 2019-08-26 LAB — COMPREHENSIVE METABOLIC PANEL
ALT: 8 U/L (ref 0–44)
AST: 18 U/L (ref 15–41)
Albumin: 2.9 g/dL — ABNORMAL LOW (ref 3.5–5.0)
Alkaline Phosphatase: 40 U/L (ref 38–126)
Anion gap: 13 (ref 5–15)
BUN: 48 mg/dL — ABNORMAL HIGH (ref 8–23)
CO2: 19 mmol/L — ABNORMAL LOW (ref 22–32)
Calcium: 8.8 mg/dL — ABNORMAL LOW (ref 8.9–10.3)
Chloride: 97 mmol/L — ABNORMAL LOW (ref 98–111)
Creatinine, Ser: 1.9 mg/dL — ABNORMAL HIGH (ref 0.61–1.24)
GFR calc Af Amer: 39 mL/min — ABNORMAL LOW (ref >60)
GFR calc non Af Amer: 34 mL/min — ABNORMAL LOW (ref >60)
Glucose, Bld: 311 mg/dL — ABNORMAL HIGH (ref 70–99)
Potassium: 4.5 mmol/L (ref 3.5–5.1)
Sodium: 129 mmol/L — ABNORMAL LOW (ref 135–145)
Total Bilirubin: 0.7 mg/dL (ref 0.3–1.2)
Total Protein: 6.3 g/dL — ABNORMAL LOW (ref 6.5–8.1)

## 2019-08-26 LAB — BRAIN NATRIURETIC PEPTIDE: B Natriuretic Peptide: 482.4 pg/mL — ABNORMAL HIGH (ref 0.0–100.0)

## 2019-08-26 LAB — POC OCCULT BLOOD, ED: Fecal Occult Bld: NEGATIVE

## 2019-08-26 LAB — LACTIC ACID, PLASMA: Lactic Acid, Venous: 1 mmol/L (ref 0.5–1.9)

## 2019-08-26 LAB — SARS CORONAVIRUS 2 (TAT 6-24 HRS): SARS Coronavirus 2: NEGATIVE

## 2019-08-26 LAB — TROPONIN I (HIGH SENSITIVITY)
Troponin I (High Sensitivity): 57 ng/L — ABNORMAL HIGH (ref <18)
Troponin I (High Sensitivity): 51 ng/L — ABNORMAL HIGH (ref <18)

## 2019-08-26 MED ORDER — PIPERACILLIN-TAZOBACTAM 3.375 G IVPB
3.3750 g | Freq: Three times a day (TID) | INTRAVENOUS | Status: DC
  Administered 2019-08-27 – 2019-08-28 (×4): 3.375 g via INTRAVENOUS
  Filled 2019-08-26 (×6): qty 50

## 2019-08-26 MED ORDER — LIDOCAINE-EPINEPHRINE (PF) 2 %-1:200000 IJ SOLN
20.0000 mL | Freq: Once | INTRAMUSCULAR | Status: AC
  Administered 2019-08-26: 20 mL via INTRADERMAL
  Filled 2019-08-26: qty 20

## 2019-08-26 MED ORDER — VANCOMYCIN HCL 10 G IV SOLR
1500.0000 mg | Freq: Once | INTRAVENOUS | Status: AC
  Administered 2019-08-26: 1500 mg via INTRAVENOUS
  Filled 2019-08-26: qty 1500

## 2019-08-26 MED ORDER — VANCOMYCIN HCL 10 G IV SOLR: 1500.0000 mg | INTRAVENOUS | Status: DC

## 2019-08-26 MED ORDER — INSULIN GLARGINE 100 UNIT/ML ~~LOC~~ SOLN
10.0000 [IU] | Freq: Once | SUBCUTANEOUS | Status: AC
  Administered 2019-08-26: 10 [IU] via SUBCUTANEOUS
  Filled 2019-08-26: qty 0.1

## 2019-08-26 MED ORDER — PIPERACILLIN-TAZOBACTAM 3.375 G IVPB 30 MIN
3.3750 g | Freq: Once | INTRAVENOUS | Status: AC
  Administered 2019-08-26: 3.375 g via INTRAVENOUS
  Filled 2019-08-26: qty 50

## 2019-08-26 MED ORDER — FUROSEMIDE 10 MG/ML IJ SOLN
20.0000 mg | Freq: Once | INTRAMUSCULAR | Status: AC
  Administered 2019-08-26: 20 mg via INTRAVENOUS
  Filled 2019-08-26: qty 2

## 2019-08-26 NOTE — ED Notes (Signed)
Patient transported to X-ray 

## 2019-08-26 NOTE — H&P (Signed)
History and Physical    [name] [id number] DOB: March 26, 1946 DOA: 08/26/2019  PCP: [name], MD  Patient coming from: home   Chief Complaint: fever, cough  HPI: [name] is a 73 y.o. male with medical history significant for t2dm on insulin, cad s/p mi and cabg x4 in 2004, seizure disorder, carotid artery stenosis s/p endarterectomy 1999 and stent in 2012, CVA with residual left partial hemiparesis, bladder cancer now in remission, htn, parkinsonism, ckd3, who presents with above.  Resides at home with ex-wife who is his caregiver.  Problems began 1 week ago when developed painful red swelling left buttock. Saw pcp who prescribed augmentin for abscess and arranged general surgery f/u which is scheduled for next week. Abscess has grown. Beginning about 3 days ago patient developed fevers to 101 that responded to tylenol but would return after tylenol wore off. Also increased weakness and mild confusion. Beginning about 2 days ago developed cough as well, dry cough to start, now mildly productive. Beginning yesterday traces of bright red blood in sputum. Did vomit after coughing 2 nights ago. Otherwise no nausea, no diarrhea, no melena/hematochezia. No abdominal pain. No chest pain or palpitations. Denies hx chf. Denies hx PE or DVT. No known covid contacts. No dysuria. No recent seizures. No antibiotic allergies.  Also complaining of left hip pain, present for several weeks, worsening. No recent falls. Able to ambulate but with difficulty.  ED Course: lasix, labs, cxr, incision and drainage of abscess left buttock  Review of Systems: As per HPI otherwise 10 point review of systems negative.    Past Medical History:  Diagnosis Date  . At high risk for falls   . Bladder cancer [facility])    urologist-  dr Gaynelle Arabian  . CAD (coronary artery disease) cardiologist-  dr berry  . Cerebrovascular arteriosclerosis   . Gait disturbance, post-stroke    uses cane, rollator, and  w/c long distance  . Hemiparkinsonism [facility]) neurologist-- dr Leta Baptist   right side body  . History of basal cell carcinoma excision    Sept 2016--  MOH's surgery side of nose  . History of carotid artery stenosis cardiologist-- dr berry   bilateral --  1999 s/p  bilateral ICA endarterectomy and recurrent restenosis 2012  s/p  staged bilateral stenting   per last duplex 2015  stents widely patent  . History of CVA with residual deficit neurologist-  dr Leta Baptist   3/ 1999  Right MCA  and  12/ 2004  Anterior division of  Right MCA ---  residual left spactic hemiparesis and left foot drop  . History of MI (myocardial infarction)    02/ 2004   s/p  cabg   . HOH (hard of hearing)    LEFT EAR  POST cva  . Hyperextension deformity of left knee    wears brace  . Hyperlipidemia   . Hypertension   . Left foot drop    residual from CVA  -- wears brace  . Left spastic hemiparesis ([place])    residual CVA 1999  . Pancreatic pseudocyst   . Peripheral arterial disease ([place])   . S/P CABG x 4    02/ 2004  . Seizure disorder [facility]) last seizure 2011 due to confusion   neurologist-  dr Leta Baptist-- per note seizure documented 2008 breakthrough partial complex seizure (prior tonic-clonic seizure's post CVA)  . Type 2 diabetes, diet controlled ([place])     2  . Visual neglect    LEFT EYE  Past Surgical History:  Procedure Laterality Date  . CARDIAC CATHETERIZATION  11/17/2002   significant left main disease and 3 vessel CAD, mildly depressed LV systolic  fx, [id number] left renal artery stenosis  . CAROTID ENDARTERECTOMY  12/1997   [facility])   Bilateral ICA  . CAROTID STENT INSERTION Bilateral dr berry and dr Kristine Royal--  (In-stent Restenosis)  LeftICA   02-12-2011;  [place]   02-26-2011  . CORONARY ARTERY BYPASS GRAFT  2/ 2004   dr hendrickson   x3 LIMA to LAD,free right internal mammary artery to obtuse marginal 1,SVG to distal right coronary.  . CYSTOSCOPY W/ RETROGRADES Bilateral 09/10/2015    Procedure: CYSTOSCOPY WITH RETROGRADE PYELOGRAM;  Surgeon: Carolan Clines, MD;  Location: [facility];  Service: Urology;  Laterality: Bilateral;  . CYSTOSCOPY WITH BIOPSY N/A 09/10/2015   Procedure: CYSTOSCOPY WITH BIOPSY,RIGHT TRIGONAL TUMOR 1.5 CM, EXCISIONAL BIOPSY WITH TAUBER FORCEP RIGHT POSTERIOR BLADDER WALL 1 CM, EXCISIONAL BIOPSY SATELITE BLADDER WALL 1 CM;  Surgeon: Carolan Clines, MD;  Location: [facility];  Service: Urology;  Laterality: N/A;  . CYSTOSCOPY WITH BIOPSY N/A 03/26/2017   Procedure: CYSTOSCOPY WITH BIOPSY/;  Surgeon: Carolan Clines, MD;  Location: [facility];  Service: Urology;  Laterality: N/A;  . CYSTOSCOPY WITH HYDRODISTENSION AND BIOPSY N/A 10/29/2015   Procedure: REPEAT CYSTOSCOPY BIOPSY BLADDER DEEP MUSCLES BLADDER BIOPSY;  Surgeon: Carolan Clines, MD;  Location: [place];  Service: Urology;  Laterality: N/A;  . CYSTOSCOPY WITH URETHRAL DILATATION N/A 09/10/2015   Procedure: CYSTOSCOPY WITH URETHRAL DILATATION;  Surgeon: Carolan Clines, MD;  Location: [place];  Service: Urology;  Laterality: N/A;  . FULGURATION OF BLADDER TUMOR N/A 03/26/2017   Procedure: FULGURATION OF BLADDER TUMOR;  Surgeon: Carolan Clines, MD;  Location: [facility];  Service: Urology;  Laterality: N/A;  . [place]  . MOHS SURGERY  06-20-2015   side of nose  . NM MYOCAR PERF WALL MOTION  01/09/2011   dr berry   mild to mod. perfusion defect in the basal inferolateral & mid inferolaterl regions consistant with an infarct/scar, no signigicant ischemia demonstracted/  Low Risk scan, no sig. change from previous study/  normal LV function and wall motion , ef 63%  . ORIF RIGHT ANKLE FX  1998   hardware retained  . TRANSURETHRAL RESECTION OF BLADDER TUMOR N/A 09/10/2015   Procedure: CYSTO TRANSURETHRAL RESECTION OF BLADDER TUMOR (TURBT) OF LEFT POSTERIOR BLADDER TUMOR 2 CM;  Surgeon: Carolan Clines, MD;  Location: [facility];  Service: Urology;  Laterality: N/A;  . TRANSURETHRAL RESECTION OF BLADDER TUMOR Left 04/17/2016   Procedure: TRANSURETHRAL RESECTION OF BLADDER TUMOR (TURBT);  Surgeon: Carolan Clines, MD;  Location[facility];  Service: Urology;  Laterality: Left;  . US ECHOCARDIOGRAPHY  01/03/2011   normal LVF, ef>55%/  mild LAE/ trace MR and TR,  mild AV sclerosis without stenosis     reports that he quit smoking about 11 years ago. His smoking use included cigarettes. He quit after 5.00 years of use. He has never used smokeless tobacco. He reports that he does not drink alcohol or use drugs.  Allergies  Allergen Reactions  . Altace [Ramipril] Cough    Family History  Problem Relation Age of Onset  . Stroke Father   . Heart attack Father   . Stroke Mother   . Diabetes Brother   . Hyperlipidemia Brother   . Hypertension Brother   . Hyperlipidemia Sister   .  Hypertension Sister     Prior to Admission medications   Medication Sig Start Date End Date Taking? Authorizing Provider  amLODipine (NORVASC) 10 MG tablet Take 1 tablet (10 mg total) by mouth daily. 04/26/19 07/25/19  Erlene Quan, PA-C  carbidopa-levodopa (SINEMET IR) 25-100 MG tablet Two tablets in the am, 1 tablet at lunch and 1 tablet in pm. 07/13/19   Lomax, Amy, NP  cholecalciferol (VITAMIN D) 1000 units tablet Take 2,000 Units by mouth daily with breakfast.     [provider]  Coenzyme Q10 (COQ-10) 50 MG CAPS Take by mouth 2 (two) times daily.     [provider]  dantrolene (DANTRIUM) 50 MG capsule TAKE ONE CAPSULE BY MOUTH TWICE A DAY 06/29/19   Meredith Staggers, MD  dipyridamole-aspirin (AGGRENOX) 200-25 MG 12hr capsule TAKE ONE CAPSULE BY MOUTH TWICE A DAY 06/27/19   Penumalli, Earlean Polka, MD  ezetimibe (ZETIA) 10 MG tablet TAKE ONE TABLET BY MOUTH DAILY 06/22/19   Lorretta Harp, MD  fenofibrate (TRICOR) 48 MG tablet TAKE ONE TABLET BY MOUTH DAILY 07/29/19    Lorretta Harp, MD  Flaxseed, Linseed, 1000 MG CAPS Take 1,000 mg by mouth every evening.     [provider]  FOLBIC 2.5-25-2 MG TABS tablet Take 1 tablet by mouth once daily with food 04/13/19   Penumalli, Earlean Polka, MD  hydrALAZINE (APRESOLINE) 25 MG tablet TAKE ONE TABLET BY MOUTH TWICE A DAY 08/03/19   Kilroy, Luke K, PA-C  insulin lispro (HUMALOG KWIKPEN) 100 UNIT/ML KiwkPen Inject 4 Units into the skin See admin instructions. Takes 3 times daily before meals for sugar over 200 only    [provider]  KEPPRA 500 MG tablet TAKE ONE TABLET BY MOUTH TWICE A DAY 07/27/19   Penumalli, Earlean Polka, MD  metoprolol tartrate (LOPRESSOR) 25 MG tablet TAKE ONE TABLET BY MOUTH DAILY (KEEP OFFICE VISIT) 11/03/18   Lorretta Harp, MD  nitroGLYCERIN (NITROSTAT) 0.4 MG SL tablet Place 1 tablet (0.4 mg total) under the tongue every 5 (five) minutes as needed for chest pain. 10/19/17   Lorretta Harp, MD  olmesartan (BENICAR) 20 MG tablet Take 1 tablet (20 mg total) by mouth daily. 12/09/18   Lorretta Harp, MD  [facility] DELICA LANCETS [id number] MISC  07/29/17   [provider]  [facility] VERIO test strip  08/16/17   [provider]  RAPAFLO 8 MG CAPS capsule Take 8 mg by mouth daily with breakfast.  04/22/17   [provider]  rosuvastatin (CRESTOR) 20 MG tablet Take 1 tablet (20 mg total) by mouth daily. 02/17/19   Lorretta Harp, MD  TOUJEO SOLOSTAR 300 UNIT/ML SOPN  04/05/19   [provider]    Physical Exam: Vitals:   08/26/19 2215 08/26/19 2230 08/26/19 2245 08/26/19 2300  BP: (!) 171/72 (!) 162/64 (!) 177/62 (!) 168/70  Pulse: (!) 102 99 100 99  Resp: (!) 31 (!) 26 (!) 31 (!) 31  Temp:      TempSrc:      SpO2: 93% 94% 94% 93%  Weight:      Height:        Constitutional: No acute distress, chronically ill appearing Head: Atraumatic Eyes: Conjunctiva clear ENM: Moist mucous membranes. poordentition.  Neck: Supple Respiratory: scattered  rhonchi, mild tachypnea.  Cardiovascular: Regular rate and rhythm. Mild systolic murmur Abdomen: mildly distended, non-tender Musculoskeletal: contractures on left, decreased muscle tone on left Skin: large erythematous fluctuant nodule left  buttock few inches from anal verge Extremities: trace b/l LE edema. No calf swelling/tenderness Neurologic: Alert, moving all 4 extremities. Psychiatric: Normal insight and judgement.   Labs on Admission: I have personally reviewed following labs and imaging studies  CBC: Recent Labs  Lab 08/26/19 1620  WBC 12.5*  NEUTROABS 9.4*  HGB 10.6*  HCT 32.3*  MCV 97.3  PLT [id number]   Basic Metabolic Panel: Recent Labs  Lab 08/26/19 1620  NA 129*  K 4.5  CL 97*  CO2 19*  GLUCOSE 311*  BUN 48*  CREATININE 1.90*  CALCIUM 8.8*   GFR: Estimated Creatinine Clearance: 30.4 mL/min (A) (by C-G formula based on SCr of 1.9 mg/dL (H)). Liver Function Tests: Recent Labs  Lab 08/26/19 1620  AST 18  ALT 8  ALKPHOS 40  BILITOT 0.7  PROT 6.3*  ALBUMIN 2.9*   No results for input(s): LIPASE, AMYLASE in the last 168 hours. No results for input(s): AMMONIA in the last 168 hours. Coagulation Profile: No results for input(s): INR, PROTIME in the last 168 hours. Cardiac Enzymes: No results for input(s): CKTOTAL, CKMB, CKMBINDEX, TROPONINI in the last 168 hours. BNP (last 3 results) No results for input(s): PROBNP in the last 8760 hours. HbA1C: No results for input(s): HGBA1C in the last 72 hours. CBG: No results for input(s): GLUCAP in the last 168 hours. Lipid Profile: No results for input(s): CHOL, HDL, LDLCALC, TRIG, CHOLHDL, LDLDIRECT in the last 72 hours. Thyroid Function Tests: No results for input(s): TSH, T4TOTAL, FREET4, T3FREE, THYROIDAB in the last 72 hours. Anemia Panel: No results for input(s): VITAMINB12, FOLATE, FERRITIN, TIBC, IRON, RETICCTPCT in the last 72 hours. Urine analysis:    Component Value Date/Time   COLORURINE YELLOW  05/22/2011 1129   APPEARANCEUR CLEAR 05/22/2011 1129   LABSPEC 1.014 05/22/2011 1129   PHURINE 7.5 05/22/2011 1129   GLUCOSEU NEGATIVE 05/22/2011 1129   HGBUR NEGATIVE 05/22/2011 1129   BILIRUBINUR NEGATIVE 05/22/2011 1129   KETONESUR NEGATIVE 05/22/2011 1129   PROTEINUR NEGATIVE 05/22/2011 1129   UROBILINOGEN 0.2 05/22/2011 1129   NITRITE NEGATIVE 05/22/2011 1129   LEUKOCYTESUR NEGATIVE 05/22/2011 1129    Radiological Exams on Admission: Dg Chest Portable 1 View  Result Date: 08/26/2019 CLINICAL DATA:  Hypoxia EXAM: PORTABLE CHEST 1 VIEW COMPARISON:  05/22/2011 FINDINGS: Postop CABG. Interval development of pulmonary vascular congestion and mild edema. Small bilateral effusions and bibasilar atelectasis. IMPRESSION: Congestive heart failure with mild edema and small bilateral effusions. Electronically Signed   By: Franchot Gallo M.D.   On: 08/26/2019 15:41   Dg Hips Bilat With Pelvis 2v  Result Date: 08/26/2019 CLINICAL DATA:  Pain EXAM: DG HIP (WITH OR WITHOUT PELVIS) 2V BILAT COMPARISON:  None. FINDINGS: Vascular calcifications are noted. There is mild-to-moderate osteoarthritis involving both hips. There is no acute displaced fracture or dislocation. There is osteopenia. Phleboliths project over the patient's pelvis. IMPRESSION: No acute osseous abnormality. Electronically Signed   By: Constance Holster M.D.   On: 08/26/2019 20:45    EKG: Independently reviewed. Nsr, tw flattening medial, no other ischemic changes. Normal axis.  Assessment/Plan Principal Problem:   Sepsis ([place]) Active Problems:   Carotid stenosis   Spastic hemiplegia affecting nondominant side (HCC)   Seizure (HCC)   Chronic ischemic right MCA stroke   Hx of CABG   Parkinsonism (HCC)   Essential hypertension   CRI (chronic renal insufficiency), stage 3 (moderate)   History of bladder cancer   Abscess of buttock   Hyponatremia   Normocytic  anemia   Hemoptysis   Hip pain, left   CHF exacerbation (HCC)    # Buttock abscess # Sepsis, possible - abscess left buttock that has now been incised and drained by EDP (no packing). Febrile at home per wife, also with leukocytosis, though afebrile here. Unclear if abscess/cellulitis is more of an incidental finding or whether has led to sepsis picture that is having hemodynamic effects, but elevated bp, normal hr, absence of fever or hypothermia here and decent response to diuretic (not quantified in ED) suggest against that. Abscess far enough from anal verge, do not think perirectal. Lactate wnl.   - f/u wound and blood cultures - wound care - abx: vanc/zosyn, likely able to de-escalate to orals assuming clinical picture continues to support CHF exacerbation over sepsis - holding on further fluids given presumption of chf exacerbation as below - progressive unit - continue O2  # CHF exacerbation - no known hx of chf but hx cad and multiple risk factors. Most recent TTE I can see is from 2012 which was essentially normal. Here hypoxic to 80s requiring 4 units [place], CXR with diffuse vascular congestion, and bnp mild/moderately elevated to 482 (no priors available for comparison). And tachypnic. With report of fever at home infectious lung etiologies are possible. No LE edema which somewhat complicates the story of this being CHF exacerbation. covid negative, no focal findings on cxr or exam. Hemoptysis, mild, may be 2/2 coughing, reported one episode of vomiting, PE, or infectious etiology not seen on CXR. S/p 20 mg lasix in ED with good response. - holding on CT of chest to eval for PE given elevated creatine. Will treat for presumed chf exacerbation with another dose of lasix in early morning and TTE - vanc/zosyn as above - f/u d dimer (though if elevated many possible reasons for that other than PE); if elevated vq scan (or cta if renal function improves) - AM BNP - I/Os  # acute kidney injury - unclear if aki vs progression ov ckd as last known cr 1.66 1  year ago. Likely prerenal.  # Hyponatremia - mild, corrects to 132. Likely 2/2 acute illness, renal dysfunction - monitor  # Acute ypoxic respiratory failure - O2 mid 80s on room air, to low 90s on 3 L [place] O2 - further w/u of underlying cause as above  # Left hip pain - subacute in nature. Hx partial left hemiplegia 2/2 cva, uses brace in leg. No obvious deformity. X ray hips shows no fracture - consider pt eval inpt, outpt ortho f/u  # normocytic anemia - mild, h 10.6. ED hemoccult negative. Mild hemoptysis as above - f/u iron panel, b12/folate  # elevated troponin - mildly elevated to 57, likely 2/2 hyperdynamic state, aki. Repeat improved to 51. EKG non-ischemic. Asymptomatic - continue to monitor symptoms - telemetry, AM EKG  # T2DM - here glucose elevated to 311 - continue home glargine 10 units nightly - sliding scale insulin  # CAD, carotid stenosis - cont home zetia, fenofibrate, metoprolol, rosuvastatin, aggrenox  # HTN - bp wnl - hold home amlodipine and benicar for now; continue metoprolol  # Parkinsonism - continue home sinemet and dantrolene  # Seizure disorder - cont home keppra   DVT prophylaxis: heparin Code Status: full  Family Communication: ex-wife jacqueline  Disposition Plan: tbd  Consults called: none  Admission status: progressive    Desma Maxim MD [facility] Pager [phone]  If 7PM-7AM, please contact night-coverage [email or web address] Password  [email or web address]  08/27/2019, 12:42 AM

## 2019-08-26 NOTE — ED Notes (Signed)
ED Provider at bedside. 

## 2019-08-26 NOTE — ED Provider Notes (Addendum)
Physical Exam  BP 129/82   Pulse 94   Temp 98.2 F (36.8 C) (Oral)   Resp (!) 26   Ht 5\' 3"  (1.6 m)   Wt 74.4 kg   SpO2 90%   BMI 29.05 kg/m   Physical Exam Vitals signs and nursing note reviewed.  Constitutional:      General: He is not in acute distress.    Appearance: Normal appearance. He is well-developed.  HENT:     Head: Normocephalic and atraumatic.  Eyes:     General: No scleral icterus.       Right eye: No discharge.        Left eye: No discharge.     Conjunctiva/sclera: Conjunctivae normal.     Pupils: Pupils are equal, round, and reactive to light.  Neck:     Musculoskeletal: Normal range of motion.  Cardiovascular:     Rate and Rhythm: Normal rate and regular rhythm.  Pulmonary:     Effort: Pulmonary effort is normal. Tachypnea present. No respiratory distress.     Breath sounds: Examination of the right-lower field reveals rales. Examination of the left-lower field reveals rales. Rales present.  Abdominal:     General: There is no distension.     Palpations: Abdomen is soft.     Tenderness: There is no abdominal tenderness.  Genitourinary:    Comments: 4x4 fluctuant abscess on the R buttock with surrounding induration Skin:    General: Skin is warm and dry.  Neurological:     Mental Status: He is alert and oriented to person, place, and time.  Psychiatric:        Behavior: Behavior normal.     ED Course/Procedures     .Marland KitchenIncision and Drainage  Date/Time: 08/26/2019 8:06 PM Performed by: Recardo Evangelist, PA-C Authorized by: Recardo Evangelist, PA-C   Consent:    Consent obtained:  Verbal   Consent given by:  Patient   Risks discussed:  Bleeding, incomplete drainage, pain and damage to other organs   Alternatives discussed:  No treatment Universal protocol:    Procedure explained and questions answered to patient or proxy's satisfaction: yes     Relevant documents present and verified: yes     Test results available and properly labeled:  yes     Imaging studies available: yes     Required blood products, implants, devices, and special equipment available: yes     Site/side marked: yes     Immediately prior to procedure a time out was called: yes     Patient identity confirmed:  Verbally with patient Location:    Type:  Abscess Pre-procedure details:    Skin preparation:  Betadine Anesthesia (see MAR for exact dosages):    Anesthesia method:  Local infiltration   Local anesthetic:  Lidocaine 2% WITH epi Procedure type:    Complexity:  Simple Procedure details:    Incision types:  Single straight   Incision depth:  Subcutaneous   Scalpel blade:  11   Wound management:  Probed and deloculated, irrigated with saline and extensive cleaning   Drainage:  Purulent   Drainage amount:  Moderate   Wound treatment:  Wound left open   Packing materials:  None Post-procedure details:    Patient tolerance of procedure:  Tolerated well, no immediate complications   CRITICAL CARE Performed by: Recardo Evangelist   Total critical care time: 35 minutes  Critical care time was exclusive of separately billable procedures and treating other patients.  Critical care was necessary to treat or prevent imminent or life-threatening deterioration.  Critical care was time spent personally by me on the following activities: development of treatment plan with patient and/or surrogate as well as nursing, discussions with consultants, evaluation of patient's response to treatment, examination of patient, obtaining history from patient or surrogate, ordering and performing treatments and interventions, ordering and review of laboratory studies, ordering and review of radiographic studies, pulse oximetry and re-evaluation of patient's condition..  MDM  Pt signed out to me by previous team.  He states he has a cough and is generally weak. He reports some blood in the sputum. Has hx of CAD s/p CABG, hx of CVA with left sided weakness, bladder  cancer. He is tachypenic and hypoxic requiring 2L of O2. CXR shows edema. BNP and trop are minimally elevated. Multiple derangements on CMP. Will give low dose lasix. Additionally he reports being on antibiotics (Augmentin) for an abscess on his buttocks. He has been referred to surgery to have it opened but appt is not till later this month. I&D performed and pt tolerated well. Wound culture collected. Will admit for further management.  Discussed with Dr. Si Raider who will admit.       Recardo Evangelist, PA-C 08/26/19 2007    Recardo Evangelist, PA-C 08/26/19 2207    Drenda Freeze, MD 08/26/19 2217

## 2019-08-26 NOTE — ED Notes (Signed)
Admitting at bedside 

## 2019-08-26 NOTE — ED Notes (Signed)
In addition to the 841ml output this NT noticed 763ml in urinal by bedside.

## 2019-08-26 NOTE — Progress Notes (Signed)
Pharmacy Antibiotic Note  [name] is a 73 y.o. male admitted on 08/26/2019 with L-buttock abscess and concern for sepsis.  Pharmacy has been consulted for Vancomycin & Zosyn dosing.  Plan: - Vancomycin 1500 mg IV x 1 followed by 1500 mg IV every 48 hours - Zosyn 3.375g IV x 1 followed by 3.375g IV every 8 hours (infused over 4 hours) - Will continue to follow renal function, culture results, LOT, and antibiotic de-escalation plans   Height: 5\' 3"  (160 cm) Weight: 164 lb (74.4 kg) IBW/kg (Calculated) : 56.9  Temp (24hrs), Avg:98.2 F (36.8 C), Min:98.2 F (36.8 C), Max:98.2 F (36.8 C)  Recent Labs  Lab 08/26/19 1620  WBC 12.5*  CREATININE 1.90*  LATICACIDVEN 1.0    Estimated Creatinine Clearance: 30.4 mL/min (A) (by C-G formula based on SCr of 1.9 mg/dL (H)).    Allergies  Allergen Reactions  . Altace [Ramipril] Cough    Antimicrobials this admission: Vanc 11/13 >> Zosyn 11/13 >>  Dose adjustments this admission: n/a  Microbiology results: 11/13 BCx >> 11/13 COVID >> neg 11/13 WCx >>  Thank you for allowing pharmacy to be a part of this patient's care.  Alycia Rossetti, PharmD, BCPS Clinical Pharmacist Clinical phone for 08/26/2019: [phone] 08/26/2019 10:13 PM   **Pharmacist phone directory can now be found on [email or web address] ([email or web address]).  Listed under [place].

## 2019-08-26 NOTE — ED Notes (Signed)
Pt placed on 2 L oxygen [place].

## 2019-08-26 NOTE — ED Triage Notes (Signed)
Per EMS: pt from home with C/O generalized weakness.  Pt has left sided deficits from previous stroke and uses a walker at baseline, though now is unable to ambulate.  Per pt's Ex-Wife that lives in the home with pt, he has cognitive delays from previous stroke. Also endorses a pressure ulcer on the sacral area.   EMS Vitals: BP 130/60 HR 80 RR 16 Temp 99.3  Spo2 94% RA

## 2019-08-26 NOTE — ED Provider Notes (Signed)
[place] EMERGENCY DEPARTMENT Provider Note   CSN: [id number] Arrival date & time: 08/26/19  1454     History   Chief Complaint Chief Complaint  Patient presents with  . Weakness    HPI [name] is a 73 y.o. male presents today with complaint of 1 week generalized weakness, fatigue and productive cough.  Symptoms ongoing and worsening, reports cough with dark green sputum occasionally blood-tinged.  Reports fatigue and generalized weakness is diffuse without focal weakness, he reports history of CVA with residual left-sided deficit without change.  Reports feeling somewhat short of breath, does not use supplemental oxygen at home, on arrival 89% room air placed on nasal cannula.  Denies fever/chills, headache/vision changes, neck pain, chest pain, abdominal pain, nausea/vomiting, diarrhea, dysuria, extremity pain/swelling, color change, fall/injury, new weakness, numbness or any additional concerns.    HPI  Past Medical History:  Diagnosis Date  . At high risk for falls   . Bladder cancer [facility])    urologist-  dr Gaynelle Arabian  . CAD (coronary artery disease) cardiologist-  dr berry  . Cerebrovascular arteriosclerosis   . Gait disturbance, post-stroke    uses cane, rollator, and w/c long distance  . Hemiparkinsonism [facility]) neurologist-- dr Leta Baptist   right side body  . History of basal cell carcinoma excision    Sept 2016--  MOH's surgery side of nose  . History of carotid artery stenosis cardiologist-- dr berry   bilateral --  1999 s/p  bilateral ICA endarterectomy and recurrent restenosis 2012  s/p  staged bilateral stenting   per last duplex 2015  stents widely patent  . History of CVA with residual deficit neurologist-  dr Leta Baptist   3/ 1999  Right MCA  and  12/ 2004  Anterior division of  Right MCA ---  residual left spactic hemiparesis and left foot drop  . History of MI (myocardial infarction)    02/ 2004   s/p  cabg   . HOH (hard of hearing)     LEFT EAR  POST cva  . Hyperextension deformity of left knee    wears brace  . Hyperlipidemia   . Hypertension   . Left foot drop    residual from CVA  -- wears brace  . Left spastic hemiparesis ([place])    residual CVA 1999  . Pancreatic pseudocyst   . Peripheral arterial disease ([place])   . S/P CABG x 4    02/ 2004  . Seizure disorder [facility]) last seizure 2011 due to confusion   neurologist-  dr Leta Baptist-- per note seizure documented 2008 breakthrough partial complex seizure (prior tonic-clonic seizure's post CVA)  . Type 2 diabetes, diet controlled ([place])     2  . Visual neglect    LEFT EYE    Patient Active Problem List   Diagnosis Date Noted  . CRI (chronic renal insufficiency), stage 3 (moderate) 04/21/2019  . Accelerated hypertension 08/18/2018  . Renal insufficiency 08/18/2018  . History of stroke 08/18/2018  . Essential hypertension 09/25/2015  . Parkinsonism ([place]) 04/19/2015  . Essential tremor 04/19/2015  . Parkinson's disease ([place]) 06/28/2014  . Hx of CABG 03/24/2014  . Hyperlipidemia 03/24/2014  . Aftercare following surgery of the circulatory system, [place] 11/14/2013  . Chronic ischemic right MCA stroke 03/08/2013  . Seizure disorder ([place]) 03/08/2013  . Cerebral thrombosis with cerebral infarction ([place]) 03/29/2012  . Spastic hemiplegia affecting nondominant side ([place]) 03/29/2012  . Seizure ([place]) 03/29/2012  . Carotid stenosis 10/20/2011  Past Surgical History:  Procedure Laterality Date  . CARDIAC CATHETERIZATION  11/17/2002   significant left main disease and 3 vessel CAD, mildly depressed LV systolic  fx, 123456 left renal artery stenosis  . CAROTID ENDARTERECTOMY  12/1997   [facility])   Bilateral ICA  . CAROTID STENT INSERTION Bilateral dr berry and dr Kristine Royal--  (In-stent Restenosis)  LeftICA   02-12-2011;  [place]   02-26-2011  . CORONARY ARTERY BYPASS GRAFT  2/ 2004   dr hendrickson   x3 LIMA to LAD,free right internal mammary artery to obtuse  marginal 1,SVG to distal right coronary.  . CYSTOSCOPY W/ RETROGRADES Bilateral 09/10/2015   Procedure: CYSTOSCOPY WITH RETROGRADE PYELOGRAM;  Surgeon: Carolan Clines, MD;  Location: [facility];  Service: Urology;  Laterality: Bilateral;  . CYSTOSCOPY WITH BIOPSY N/A 09/10/2015   Procedure: CYSTOSCOPY WITH BIOPSY,RIGHT TRIGONAL TUMOR 1.5 CM, EXCISIONAL BIOPSY WITH TAUBER FORCEP RIGHT POSTERIOR BLADDER WALL 1 CM, EXCISIONAL BIOPSY SATELITE BLADDER WALL 1 CM;  Surgeon: Carolan Clines, MD;  Location: [facility];  Service: Urology;  Laterality: N/A;  . CYSTOSCOPY WITH BIOPSY N/A 03/26/2017   Procedure: CYSTOSCOPY WITH BIOPSY/;  Surgeon: Carolan Clines, MD;  Location: [facility];  Service: Urology;  Laterality: N/A;  . CYSTOSCOPY WITH HYDRODISTENSION AND BIOPSY N/A 10/29/2015   Procedure: REPEAT CYSTOSCOPY BIOPSY BLADDER DEEP MUSCLES BLADDER BIOPSY;  Surgeon: Carolan Clines, MD;  Location: [place];  Service: Urology;  Laterality: N/A;  . CYSTOSCOPY WITH URETHRAL DILATATION N/A 09/10/2015   Procedure: CYSTOSCOPY WITH URETHRAL DILATATION;  Surgeon: Carolan Clines, MD;  Location: [place];  Service: Urology;  Laterality: N/A;  . FULGURATION OF BLADDER TUMOR N/A 03/26/2017   Procedure: FULGURATION OF BLADDER TUMOR;  Surgeon: Carolan Clines, MD;  Location: [facility];  Service: Urology;  Laterality: N/A;  . [place]  . MOHS SURGERY  06-20-2015   side of nose  . NM MYOCAR PERF WALL MOTION  01/09/2011   dr berry   mild to mod. perfusion defect in the basal inferolateral & mid inferolaterl regions consistant with an infarct/scar, no signigicant ischemia demonstracted/  Low Risk scan, no sig. change from previous study/  normal LV function and wall motion , ef 63%  . ORIF RIGHT ANKLE FX  1998   hardware retained  . TRANSURETHRAL RESECTION OF BLADDER TUMOR N/A 09/10/2015   Procedure: CYSTO TRANSURETHRAL  RESECTION OF BLADDER TUMOR (TURBT) OF LEFT POSTERIOR BLADDER TUMOR 2 CM;  Surgeon: Carolan Clines, MD;  Location: [facility];  Service: Urology;  Laterality: N/A;  . TRANSURETHRAL RESECTION OF BLADDER TUMOR Left 04/17/2016   Procedure: TRANSURETHRAL RESECTION OF BLADDER TUMOR (TURBT);  Surgeon: Carolan Clines, MD;  Location: [facility];  Service: Urology;  Laterality: Left;  . US ECHOCARDIOGRAPHY  01/03/2011   normal LVF, ef>55%/  mild LAE/ trace MR and TR,  mild AV sclerosis without stenosis        Home Medications    Prior to Admission medications   Medication Sig Start Date End Date Taking? Authorizing Provider  amLODipine (NORVASC) 10 MG tablet Take 1 tablet (10 mg total) by mouth daily. 04/26/19 07/25/19  Erlene Quan, PA-C  carbidopa-levodopa (SINEMET IR) 25-100 MG tablet Two tablets in the am, 1 tablet at lunch and 1 tablet in pm. 07/13/19   Lomax, Amy, NP  cholecalciferol (VITAMIN D) 1000 units tablet Take 2,000 Units by mouth daily with breakfast.     [provider]  Coenzyme Q10 (COQ-10) 50 MG CAPS Take by mouth 2 (two) times daily.     [provider]  dantrolene (DANTRIUM) 50 MG capsule TAKE ONE CAPSULE BY MOUTH TWICE A DAY 06/29/19   Meredith Staggers, MD  dipyridamole-aspirin (AGGRENOX) 200-25 MG 12hr capsule TAKE ONE CAPSULE BY MOUTH TWICE A DAY 06/27/19   Penumalli, Earlean Polka, MD  ezetimibe (ZETIA) 10 MG tablet TAKE ONE TABLET BY MOUTH DAILY 06/22/19   Lorretta Harp, MD  fenofibrate (TRICOR) 48 MG tablet TAKE ONE TABLET BY MOUTH DAILY 07/29/19   Lorretta Harp, MD  Flaxseed, Linseed, 1000 MG CAPS Take 1,000 mg by mouth every evening.     [provider]  FOLBIC 2.5-25-2 MG TABS tablet Take 1 tablet by mouth once daily with food 04/13/19   Penumalli, Earlean Polka, MD  hydrALAZINE (APRESOLINE) 25 MG tablet TAKE ONE TABLET BY MOUTH TWICE A DAY 08/03/19   Kilroy, Luke K, PA-C  insulin lispro (HUMALOG KWIKPEN) 100 UNIT/ML KiwkPen Inject 4  Units into the skin See admin instructions. Takes 3 times daily before meals for sugar over 200 only    [provider]  KEPPRA 500 MG tablet TAKE ONE TABLET BY MOUTH TWICE A DAY 07/27/19   Penumalli, Earlean Polka, MD  metoprolol tartrate (LOPRESSOR) 25 MG tablet TAKE ONE TABLET BY MOUTH DAILY (KEEP OFFICE VISIT) 11/03/18   Lorretta Harp, MD  nitroGLYCERIN (NITROSTAT) 0.4 MG SL tablet Place 1 tablet (0.4 mg total) under the tongue every 5 (five) minutes as needed for chest pain. 10/19/17   Lorretta Harp, MD  olmesartan (BENICAR) 20 MG tablet Take 1 tablet (20 mg total) by mouth daily. 12/09/18   Lorretta Harp, MD  [facility] DELICA LANCETS [id number] MISC  07/29/17   [provider]  [facility] VERIO test strip  08/16/17   [provider]  RAPAFLO 8 MG CAPS capsule Take 8 mg by mouth daily with breakfast.  04/22/17   [provider]  rosuvastatin (CRESTOR) 20 MG tablet Take 1 tablet (20 mg total) by mouth daily. 02/17/19   Lorretta Harp, MD  TOUJEO SOLOSTAR 300 UNIT/ML [facility]  04/05/19   [provider]    Family History Family History  Problem Relation Age of Onset  . Stroke Father   . Heart attack Father   . Stroke Mother   . Diabetes Brother   . Hyperlipidemia Brother   . Hypertension Brother   . Hyperlipidemia Sister   . Hypertension Sister     Social History Social History   Tobacco Use  . Smoking status: Former Smoker    Years: 5.00    Types: Cigarettes    Quit date: 11/14/2007    Years since quitting: 11.7  . Smokeless tobacco: Never Used  Substance Use Topics  . Alcohol use: No  . Drug use: No     Allergies   Altace [ramipril]   Review of Systems Review of Systems Ten systems are reviewed and are negative for acute change except as noted in the HPI   Physical Exam Updated Vital Signs BP (!) 142/64 (BP Location: Right Arm)   Pulse 81   Temp 98.2 F (36.8 C) (Oral)   Resp (!) 25   Ht 5\' 3"  (1.6 m)   Wt 74.4 kg   SpO2 (!)  89%   BMI 29.05 kg/m   Physical Exam Constitutional:      General: He is not in acute distress.    Appearance: Normal  appearance. He is well-developed. He is not ill-appearing or diaphoretic.  HENT:     Head: Normocephalic and atraumatic.     Right Ear: External ear normal.     Left Ear: External ear normal.     Nose: Nose normal.  Eyes:     General: Vision grossly intact. Gaze aligned appropriately.     Pupils: Pupils are equal, round, and reactive to light.  Neck:     Musculoskeletal: Normal range of motion.     Trachea: Trachea and phonation normal. No tracheal deviation.  Cardiovascular:     Rate and Rhythm: Normal rate and regular rhythm.     Pulses: Normal pulses.     Heart sounds: Normal heart sounds.  Pulmonary:     Effort: Pulmonary effort is normal. Tachypnea present. No respiratory distress.     Breath sounds: Normal breath sounds and air entry.  Abdominal:     General: There is no distension.     Palpations: Abdomen is soft.     Tenderness: There is no abdominal tenderness. There is no guarding or rebound.  Musculoskeletal: Normal range of motion.     Right lower leg: No edema.     Left lower leg: No edema.  Skin:    General: Skin is warm and dry.  Neurological:     Mental Status: He is alert.     GCS: GCS eye subscore is 4. GCS verbal subscore is 5. GCS motor subscore is 6.     Comments: Alert and oriented, follows commands  residual left-sided deficits that patient reports are unchanged.  Psychiatric:        Behavior: Behavior normal.    ED Treatments / Results  Labs (all labs ordered are listed, but only abnormal results are displayed) Labs Reviewed  SARS CORONAVIRUS 2 (TAT 6-24 HRS)  CULTURE, BLOOD (ROUTINE X 2)  CULTURE, BLOOD (ROUTINE X 2)  CBC WITH DIFFERENTIAL/PLATELET  COMPREHENSIVE METABOLIC PANEL  LACTIC ACID, PLASMA  LACTIC ACID, PLASMA  BRAIN NATRIURETIC PEPTIDE  TROPONIN I (HIGH SENSITIVITY)    EKG EKG Interpretation  Date/Time:   Friday August 26 2019 16:00:38 EST Ventricular Rate:  80 PR Interval:    QRS Duration: 99 QT Interval:  391 QTC Calculation: 451 R Axis:   56 Text Interpretation: Sinus rhythm Borderline T wave abnormalities No significant change since last tracing Confirmed by Wandra Arthurs [phone]) on 08/26/2019 4:09:00 PM   Radiology Dg Chest Portable 1 View  Result Date: 08/26/2019 CLINICAL DATA:  Hypoxia EXAM: PORTABLE CHEST 1 VIEW COMPARISON:  05/22/2011 FINDINGS: Postop CABG. Interval development of pulmonary vascular congestion and mild edema. Small bilateral effusions and bibasilar atelectasis. IMPRESSION: Congestive heart failure with mild edema and small bilateral effusions. Electronically Signed   By: Franchot Gallo M.D.   On: 08/26/2019 15:41    Procedures Procedures (including critical care time)  Medications Ordered in ED Medications - No data to display   Initial Impression / Assessment and Plan / ED Course  I have reviewed the triage vital signs and the nursing notes.  Pertinent labs & imaging results that were available during my care of the patient were reviewed by me and considered in my medical decision making (see chart for details).    73 year old male with 1 week of fatigue, generalized weakness and productive cough.  On initial evaluation the patient is in no acute distress resting comfortably, reports chronic left-sided deficits his baseline, he is alert and oriented, heart regular rate and rhythm, mildly tachypneic,  abdomen soft nontender without peritoneal signs.  Hypoxic at 89% on room air placed on nasal cannula with improvement.  Chest x-ray, EKG, CBC, BMP, troponin, Covid, lactic and blood cultures ordered. - Patient seen and evaluated by Dr. Francia Greaves who agrees with plan of care. - CXR:  IMPRESSION:  Congestive heart failure with mild edema and small bilateral  effusions.   BNP added  EKG: Sinus rhythm Borderline T wave abnormalities No significant change since  last tracing Confirmed by Wandra Arthurs [phone]) on 08/26/2019 4:09:00 PM - All lab work is pending, on reassessment patient comfortable no acute distress. - Care handoff given to Janetta Hora, PA-C at shift change, plan to follow-up on labs, disposition per oncoming team.  Note: Portions of this report may have been transcribed using voice recognition software. Every effort was made to ensure accuracy; however, inadvertent computerized transcription errors may still be present. Final Clinical Impressions(s) / ED Diagnoses   Final diagnoses:  None    ED Discharge Orders    None       Gari Crown 08/26/19 1612    Valarie Merino, MD 08/27/19 1153

## 2019-08-26 NOTE — ED Notes (Signed)
Pt caregiver [name] [phone]

## 2019-08-26 NOTE — ED Notes (Signed)
Patient transported to CT 

## 2019-08-26 NOTE — ED Notes (Signed)
Ct communicated that they will be taking pt for imaging shortly.

## 2019-08-27 ENCOUNTER — Encounter (HOSPITAL_COMMUNITY): Payer: Self-pay

## 2019-08-27 ENCOUNTER — Inpatient Hospital Stay (HOSPITAL_COMMUNITY): Payer: Medicare Other

## 2019-08-27 ENCOUNTER — Other Ambulatory Visit: Payer: Self-pay

## 2019-08-27 DIAGNOSIS — G8110 Spastic hemiplegia affecting unspecified side: Secondary | ICD-10-CM

## 2019-08-27 DIAGNOSIS — L0231 Cutaneous abscess of buttock: Secondary | ICD-10-CM

## 2019-08-27 DIAGNOSIS — I340 Nonrheumatic mitral (valve) insufficiency: Secondary | ICD-10-CM

## 2019-08-27 DIAGNOSIS — A419 Sepsis, unspecified organism: Principal | ICD-10-CM

## 2019-08-27 LAB — BLOOD GAS, ARTERIAL
Acid-base deficit: 3.7 mmol/L — ABNORMAL HIGH (ref 0.0–2.0)
Bicarbonate: 20.6 mmol/L (ref 20.0–28.0)
Drawn by: 33100
FIO2: 21
O2 Saturation: 88.2 %
Patient temperature: 37
pCO2 arterial: 35.8 mmHg (ref 32.0–48.0)
pH, Arterial: 7.378 (ref 7.350–7.450)
pO2, Arterial: 56.1 mmHg — ABNORMAL LOW (ref 83.0–108.0)

## 2019-08-27 LAB — COMPREHENSIVE METABOLIC PANEL
ALT: 10 U/L (ref 0–44)
AST: 20 U/L (ref 15–41)
Albumin: 2.7 g/dL — ABNORMAL LOW (ref 3.5–5.0)
Alkaline Phosphatase: 38 U/L (ref 38–126)
Anion gap: 10 (ref 5–15)
BUN: 40 mg/dL — ABNORMAL HIGH (ref 8–23)
CO2: 20 mmol/L — ABNORMAL LOW (ref 22–32)
Calcium: 8.6 mg/dL — ABNORMAL LOW (ref 8.9–10.3)
Chloride: 101 mmol/L (ref 98–111)
Creatinine, Ser: 1.8 mg/dL — ABNORMAL HIGH (ref 0.61–1.24)
GFR calc Af Amer: 42 mL/min — ABNORMAL LOW (ref >60)
GFR calc non Af Amer: 36 mL/min — ABNORMAL LOW (ref >60)
Glucose, Bld: 297 mg/dL — ABNORMAL HIGH (ref 70–99)
Potassium: 4.3 mmol/L (ref 3.5–5.1)
Sodium: 131 mmol/L — ABNORMAL LOW (ref 135–145)
Total Bilirubin: 0.7 mg/dL (ref 0.3–1.2)
Total Protein: 6.1 g/dL — ABNORMAL LOW (ref 6.5–8.1)

## 2019-08-27 LAB — CBC
HCT: 30.2 % — ABNORMAL LOW (ref 39.0–52.0)
Hemoglobin: 10.2 g/dL — ABNORMAL LOW (ref 13.0–17.0)
MCH: 31.7 pg (ref 26.0–34.0)
MCHC: 33.8 g/dL (ref 30.0–36.0)
MCV: 93.8 fL (ref 80.0–100.0)
Platelets: 246 10*3/uL (ref 150–400)
RBC: 3.22 MIL/uL — ABNORMAL LOW (ref 4.22–5.81)
RDW: 12.6 % (ref 11.5–15.5)
WBC: 12.6 10*3/uL — ABNORMAL HIGH (ref 4.0–10.5)
nRBC: 0 % (ref 0.0–0.2)

## 2019-08-27 LAB — IRON AND TIBC
Iron: 24 ug/dL — ABNORMAL LOW (ref 45–182)
Saturation Ratios: 10 % — ABNORMAL LOW (ref 17.9–39.5)
TIBC: 231 ug/dL — ABNORMAL LOW (ref 250–450)
UIBC: 207 ug/dL

## 2019-08-27 LAB — ECHOCARDIOGRAM COMPLETE
Height: 66 in
Weight: 2728 oz

## 2019-08-27 LAB — GLUCOSE, CAPILLARY
Glucose-Capillary: 280 mg/dL — ABNORMAL HIGH (ref 70–99)
Glucose-Capillary: 307 mg/dL — ABNORMAL HIGH (ref 70–99)
Glucose-Capillary: 235 mg/dL — ABNORMAL HIGH (ref 70–99)
Glucose-Capillary: 227 mg/dL — ABNORMAL HIGH (ref 70–99)

## 2019-08-27 LAB — PROTIME-INR
INR: 1.3 — ABNORMAL HIGH (ref 0.8–1.2)
Prothrombin Time: 15.7 seconds — ABNORMAL HIGH (ref 11.4–15.2)

## 2019-08-27 LAB — HEMOGLOBIN A1C
Hgb A1c MFr Bld: 7.5 % — ABNORMAL HIGH (ref 4.8–5.6)
Mean Plasma Glucose: 168.55 mg/dL

## 2019-08-27 LAB — VITAMIN B12: Vitamin B-12: 1521 pg/mL — ABNORMAL HIGH (ref 180–914)

## 2019-08-27 LAB — D-DIMER, QUANTITATIVE: D-Dimer, Quant: 2.34 ug/mL-FEU — ABNORMAL HIGH (ref 0.00–0.50)

## 2019-08-27 LAB — FERRITIN: Ferritin: 234 ng/mL (ref 24–336)

## 2019-08-27 LAB — BRAIN NATRIURETIC PEPTIDE: B Natriuretic Peptide: 381.5 pg/mL — ABNORMAL HIGH (ref 0.0–100.0)

## 2019-08-27 LAB — APTT: aPTT: 30 seconds (ref 24–36)

## 2019-08-27 MED ORDER — CARBIDOPA-LEVODOPA 25-100 MG PO TABS
1.0000 | ORAL_TABLET | ORAL | Status: DC
  Administered 2019-08-27 – 2019-08-28 (×3): 1 via ORAL
  Filled 2019-08-27 (×3): qty 1

## 2019-08-27 MED ORDER — CARBIDOPA-LEVODOPA 25-100 MG PO TABS
2.0000 | ORAL_TABLET | Freq: Every day | ORAL | Status: DC
  Filled 2019-08-27: qty 2

## 2019-08-27 MED ORDER — FUROSEMIDE 20 MG PO TABS
20.0000 mg | ORAL_TABLET | Freq: Every day | ORAL | Status: DC
  Administered 2019-08-27 – 2019-08-28 (×2): 20 mg via ORAL
  Filled 2019-08-27 (×2): qty 1

## 2019-08-27 MED ORDER — FUROSEMIDE 10 MG/ML IJ SOLN
20.0000 mg | Freq: Once | INTRAMUSCULAR | Status: AC
  Administered 2019-08-27: 20 mg via INTRAVENOUS
  Filled 2019-08-27: qty 2

## 2019-08-27 MED ORDER — SODIUM CHLORIDE 0.9% FLUSH
3.0000 mL | Freq: Two times a day (BID) | INTRAVENOUS | Status: DC
  Administered 2019-08-27 (×2): 3 mL via INTRAVENOUS

## 2019-08-27 MED ORDER — ASPIRIN-DIPYRIDAMOLE ER 25-200 MG PO CP12
1.0000 | ORAL_CAPSULE | Freq: Two times a day (BID) | ORAL | Status: DC
  Administered 2019-08-27 – 2019-08-28 (×4): 1 via ORAL
  Filled 2019-08-27 (×5): qty 1

## 2019-08-27 MED ORDER — INFLUENZA VAC A&B SA ADJ QUAD 0.5 ML IM PRSY
0.5000 mL | PREFILLED_SYRINGE | INTRAMUSCULAR | Status: AC
  Administered 2019-08-28: 0.5 mL via INTRAMUSCULAR
  Filled 2019-08-27: qty 0.5

## 2019-08-27 MED ORDER — CARBIDOPA-LEVODOPA 25-100 MG PO TABS
2.0000 | ORAL_TABLET | Freq: Every day | ORAL | Status: DC
  Administered 2019-08-27 – 2019-08-28 (×2): 2 via ORAL
  Filled 2019-08-27 (×3): qty 2

## 2019-08-27 MED ORDER — FENOFIBRATE 54 MG PO TABS
54.0000 mg | ORAL_TABLET | Freq: Every day | ORAL | Status: DC
  Administered 2019-08-27 – 2019-08-28 (×2): 54 mg via ORAL
  Filled 2019-08-27 (×2): qty 1

## 2019-08-27 MED ORDER — HEPARIN SODIUM (PORCINE) 5000 UNIT/ML IJ SOLN
5000.0000 [IU] | Freq: Three times a day (TID) | INTRAMUSCULAR | Status: DC
  Administered 2019-08-27 – 2019-08-28 (×4): 5000 [IU] via SUBCUTANEOUS
  Filled 2019-08-27 (×4): qty 1

## 2019-08-27 MED ORDER — METOPROLOL TARTRATE 25 MG PO TABS
25.0000 mg | ORAL_TABLET | Freq: Two times a day (BID) | ORAL | Status: DC
  Administered 2019-08-27 – 2019-08-28 (×3): 25 mg via ORAL
  Filled 2019-08-27 (×3): qty 1

## 2019-08-27 MED ORDER — SODIUM CHLORIDE 0.9 % IV SOLN: 250.0000 mL | INTRAVENOUS | Status: DC | PRN

## 2019-08-27 MED ORDER — INSULIN GLARGINE 100 UNIT/ML ~~LOC~~ SOLN
10.0000 [IU] | Freq: Every day | SUBCUTANEOUS | Status: DC
  Administered 2019-08-27: 10 [IU] via SUBCUTANEOUS
  Filled 2019-08-27 (×2): qty 0.1

## 2019-08-27 MED ORDER — SODIUM CHLORIDE 0.9% FLUSH: 3.0000 mL | INTRAVENOUS | Status: DC | PRN

## 2019-08-27 MED ORDER — INSULIN ASPART 100 UNIT/ML ~~LOC~~ SOLN
0.0000 [IU] | Freq: Three times a day (TID) | SUBCUTANEOUS | Status: DC
  Administered 2019-08-27: 5 [IU] via SUBCUTANEOUS
  Administered 2019-08-27: 8 [IU] via SUBCUTANEOUS
  Administered 2019-08-27 – 2019-08-28 (×2): 11 [IU] via SUBCUTANEOUS
  Administered 2019-08-28: 3 [IU] via SUBCUTANEOUS

## 2019-08-27 MED ORDER — DANTROLENE SODIUM 25 MG PO CAPS
50.0000 mg | ORAL_CAPSULE | Freq: Two times a day (BID) | ORAL | Status: DC
  Administered 2019-08-27 – 2019-08-28 (×4): 50 mg via ORAL
  Filled 2019-08-27 (×5): qty 2

## 2019-08-27 MED ORDER — LEVETIRACETAM 500 MG PO TABS
500.0000 mg | ORAL_TABLET | Freq: Two times a day (BID) | ORAL | Status: DC
  Administered 2019-08-27 – 2019-08-28 (×4): 500 mg via ORAL
  Filled 2019-08-27 (×4): qty 1

## 2019-08-27 MED ORDER — ROSUVASTATIN CALCIUM 20 MG PO TABS
20.0000 mg | ORAL_TABLET | Freq: Every day | ORAL | Status: DC
  Administered 2019-08-27 – 2019-08-28 (×2): 20 mg via ORAL
  Filled 2019-08-27 (×2): qty 1

## 2019-08-27 MED ORDER — PNEUMOCOCCAL VAC POLYVALENT 25 MCG/0.5ML IJ INJ
0.5000 mL | INJECTION | INTRAMUSCULAR | Status: AC
  Administered 2019-08-28: 0.5 mL via INTRAMUSCULAR
  Filled 2019-08-27: qty 0.5

## 2019-08-27 MED ORDER — TAMSULOSIN HCL 0.4 MG PO CAPS
0.4000 mg | ORAL_CAPSULE | Freq: Every day | ORAL | Status: DC
  Administered 2019-08-27 – 2019-08-28 (×2): 0.4 mg via ORAL
  Filled 2019-08-27 (×2): qty 1

## 2019-08-27 MED ORDER — EZETIMIBE 10 MG PO TABS
10.0000 mg | ORAL_TABLET | Freq: Every day | ORAL | Status: DC
  Administered 2019-08-27 – 2019-08-28 (×2): 10 mg via ORAL
  Filled 2019-08-27 (×2): qty 1

## 2019-08-27 NOTE — Progress Notes (Signed)
PROGRESS NOTE    [name]  [id number] DOB: 1946/07/22 DOA: 08/26/2019 PCP: [name], MD    Brief Narrative:  73 year old gentleman with history of type 2 diabetes on insulin, coronary artery disease status post CABG in 2004, seizure disorder, carotid artery stenosis, CVA with residual left partial hemiparesis and bladder cancer now in remission hypertension and Parkinson's, stage III CKD presented to the hospital with about 1 week of painful red swelling of the left buttock, 2 days of coughing and shortness of breath.  Treated with antibiotics at home with no relief.  In the emergency room, afebrile, abscess and induration on the left buttock incised, cultures pending, started on broad-spectrum antibiotics.  Chest x-ray with fluid in the fissures, proBNP elevated.   Assessment & Plan:   Principal Problem:   Sepsis ([place]) Active Problems:   Carotid stenosis   Spastic hemiplegia affecting nondominant side (HCC)   Seizure (HCC)   Chronic ischemic right MCA stroke   Hx of CABG   Parkinsonism (HCC)   Essential hypertension   CRI (chronic renal insufficiency), stage 3 (moderate)   History of bladder cancer   Abscess of buttock   Hyponatremia   Normocytic anemia   Hemoptysis   Hip pain, left   CHF exacerbation (HCC)  Buttock abscess: Status post I&D, cultures pending, clinically improving, continue local care, sitz bath.  With clinical improvement, discontinue vancomycin, continue Zosyn today and likely change to oral antibiotics tomorrow.  Suspected acute congestive heart failure: With history of coronary artery disease and CABG, may have diastolic dysfunction explaining shortness of breath and dyspnea.  Status post 1 dose of IV Lasix, he has abnormal renal functions.  Euvolemic today.  Echocardiogram pending.  Will treat based upon echocardiogram results.  Troponins are negative.  EKG nonischemic.  CVA with left hemiplegia: We will start working with PT OT.  AKI on  CKD stage III: Baseline creatinine about 1.6.  Presented with creatinine of 1.9.  Close monitoring on diuretics.  Type 2 diabetes: Stable.  On insulin.  Seizure disorder: On Keppra.  Stable.   DVT prophylaxis: Heparin subcu Code Status: Full code Family Communication: Wife on the phone Disposition Plan: Home with home therapies   Consultants:   None  Procedures:   None  Antimicrobials:   Vancomycin, 08/26/2019-08/27/2019  Zosyn, 08/26/2019>>>   Subjective: Patient seen and examined.  No overnight events.  He has some pain on his left hip.  Denies any shortness of breath at rest.  He wants to ambulate.  Afebrile.  Feels much better on the left buttock.  Objective: Vitals:   08/27/19 0102 08/27/19 0504 08/27/19 0819 08/27/19 1019  BP: (!) 180/60 (!) 149/61 (!) 109/53 114/64  Pulse: (!) 102 92 88 87  Resp: 18 20 18    Temp: 98.4 F (36.9 C) 98 F (36.7 C) 98.3 F (36.8 C) 97.9 F (36.6 C)  TempSrc: Oral Oral Oral Oral  SpO2: 98% 93% 95% 96%  Weight: 77.3 kg     Height: 5\' 6"  (1.676 m)       Intake/Output Summary (Last 24 hours) at 08/27/2019 1049 Last data filed at 08/27/2019 0940 Gross per 24 hour  Intake 1331.92 ml  Output 2700 ml  Net -1368.08 ml   Filed Weights   08/26/19 1507 08/27/19 0102  Weight: 74.4 kg 77.3 kg    Examination:  General exam: Appears calm and comfortable, on 1 L oxygen. Respiratory system: Clear to auscultation. Respiratory effort normal.  No added sounds. Cardiovascular system:  S1 & S2 heard, RRR. No JVD, murmurs, rubs, gallops or clicks. No pedal edema. Gastrointestinal system: Abdomen is nondistended, soft and nontender. No organomegaly or masses felt. Normal bowel sounds heard. Central nervous system: Alert and oriented. No focal neurological deficits. Extremities: Right upper and lower extremity normal.  Left upper extremity 0/5, left lower extremity 2/5. Skin: No rashes, lesions or ulcers Psychiatry: Judgement and insight  appear normal. Mood & affect appropriate.   Buttock exam: Almost healed pinpoint incision present with no drainage, induration about 4 x 4 centimeter with no fluctuation.  Data Reviewed: I have personally reviewed following labs and imaging studies  CBC: Recent Labs  Lab 08/26/19 1620 08/27/19 0257  WBC 12.5* 12.6*  NEUTROABS 9.4*  --   HGB 10.6* 10.2*  HCT 32.3* 30.2*  MCV 97.3 93.8  PLT 220 0000000   Basic Metabolic Panel: Recent Labs  Lab 08/26/19 1620 08/27/19 0257  NA 129* 131*  K 4.5 4.3  CL 97* 101  CO2 19* 20*  GLUCOSE 311* 297*  BUN 48* 40*  CREATININE 1.90* 1.80*  CALCIUM 8.8* 8.6*   GFR: Estimated Creatinine Clearance: 34.7 mL/min (A) (by C-G formula based on SCr of 1.8 mg/dL (H)). Liver Function Tests: Recent Labs  Lab 08/26/19 1620 08/27/19 0257  AST 18 20  ALT 8 10  ALKPHOS 40 38  BILITOT 0.7 0.7  PROT 6.3* 6.1*  ALBUMIN 2.9* 2.7*   No results for input(s): LIPASE, AMYLASE in the last 168 hours. No results for input(s): AMMONIA in the last 168 hours. Coagulation Profile: Recent Labs  Lab 08/27/19 0257  INR 1.3*   Cardiac Enzymes: No results for input(s): CKTOTAL, CKMB, CKMBINDEX, TROPONINI in the last 168 hours. BNP (last 3 results) No results for input(s): PROBNP in the last 8760 hours. HbA1C: No results for input(s): HGBA1C in the last 72 hours. CBG: Recent Labs  Lab 08/27/19 0626  GLUCAP 280*   Lipid Profile: No results for input(s): CHOL, HDL, LDLCALC, TRIG, CHOLHDL, LDLDIRECT in the last 72 hours. Thyroid Function Tests: No results for input(s): TSH, T4TOTAL, FREET4, T3FREE, THYROIDAB in the last 72 hours. Anemia Panel: Recent Labs    08/27/19 0257  VITAMINB12 1,521*  FERRITIN 234  TIBC 231*  IRON 24*   Sepsis Labs: Recent Labs  Lab 08/26/19 1620  LATICACIDVEN 1.0    Recent Results (from the past 240 hour(s))  SARS CORONAVIRUS 2 (TAT 6-24 HRS) Nasopharyngeal Nasopharyngeal Swab     Status: None   Collection Time:  08/26/19  3:16 PM   Specimen: Nasopharyngeal Swab  Result Value Ref Range Status   SARS Coronavirus 2 NEGATIVE NEGATIVE Final    Comment: (NOTE) SARS-CoV-2 target nucleic acids are NOT DETECTED. The SARS-CoV-2 RNA is generally detectable in upper and lower respiratory specimens during the acute phase of infection. Negative results do not preclude SARS-CoV-2 infection, do not rule out co-infections with other pathogens, and should not be used as the sole basis for treatment or other patient management decisions. Negative results must be combined with clinical observations, patient history, and epidemiological information. The expected result is Negative. Fact Sheet for Patients: SugarRoll.be Fact Sheet for Healthcare Providers: https://www.woods-mathews.com/ This test is not yet approved or cleared by the [place] FDA and  has been authorized for detection and/or diagnosis of SARS-CoV-2 by FDA under an Emergency Use Authorization (EUA). This EUA will remain  in effect (meaning this test can be used) for the duration of the COVID-19 declaration under Section 56 4(b)(1) of  the Act, 21 U.S.C. section 360bbb-3(b)(1), unless the authorization is terminated or revoked sooner. Performed at [facility], [place] [address] [place], [place] [address]   Wound or Superficial Culture     Status: None (Preliminary result)   Collection Time: 08/26/19  8:54 PM   Specimen: Abscess; Wound  Result Value Ref Range Status   Specimen Description ABSCESS  Final   Special Requests Normal  Final   Gram Stain   Final    RARE WBC PRESENT, PREDOMINANTLY PMN RARE GRAM POSITIVE COCCI IN CLUSTERS Performed at [facility] [address], [place], [place] [address]    Culture PENDING  Incomplete   Report Status PENDING  Incomplete         Radiology Studies: Dg Chest Portable 1 View  Result Date: 08/26/2019 CLINICAL DATA:  Hypoxia EXAM:  PORTABLE CHEST 1 VIEW COMPARISON:  05/22/2011 FINDINGS: Postop CABG. Interval development of pulmonary vascular congestion and mild edema. Small bilateral effusions and bibasilar atelectasis. IMPRESSION: Congestive heart failure with mild edema and small bilateral effusions. Electronically Signed   By: Franchot Gallo M.D.   On: 08/26/2019 15:41   Dg Hips Bilat With Pelvis 2v  Result Date: 08/26/2019 CLINICAL DATA:  Pain EXAM: DG HIP (WITH OR WITHOUT PELVIS) 2V BILAT COMPARISON:  None. FINDINGS: Vascular calcifications are noted. There is mild-to-moderate osteoarthritis involving both hips. There is no acute displaced fracture or dislocation. There is osteopenia. Phleboliths project over the patient's pelvis. IMPRESSION: No acute osseous abnormality. Electronically Signed   By: Constance Holster M.D.   On: 08/26/2019 20:45        Scheduled Meds:  carbidopa-levodopa  1 tablet Oral 2 times per day   carbidopa-levodopa  2 tablet Oral Q breakfast   dantrolene  50 mg Oral BID   dipyridamole-aspirin  1 capsule Oral BID   ezetimibe  10 mg Oral Daily   fenofibrate  54 mg Oral Daily   heparin  5,000 Units Subcutaneous Q8H   [START ON 08/28/2019] influenza vaccine adjuvanted  0.5 mL Intramuscular Tomorrow-1000   insulin aspart  0-15 Units Subcutaneous TID WC   insulin glargine  10 Units Subcutaneous QHS   levETIRAcetam  500 mg Oral BID   metoprolol tartrate  25 mg Oral BID   [START ON 08/28/2019] pneumococcal 23 valent vaccine  0.5 mL Intramuscular Tomorrow-1000   rosuvastatin  20 mg Oral Daily   sodium chloride flush  3 mL Intravenous Q12H   tamsulosin  0.4 mg Oral QPC breakfast   Continuous Infusions:  sodium chloride     piperacillin-tazobactam (ZOSYN)  IV 3.375 g (08/27/19 0511)   [START ON 08/28/2019] vancomycin       LOS: 1 day    Time spent: 30 minutes    Barb Merino, MD [facility] Pager [phone]

## 2019-08-27 NOTE — Progress Notes (Signed)
Patient missing a shoe. He stated he had both shoes while he was in ED and  this morning PT came to work with patient, only one shoe at bedside. Called ED, not able to find, will look one more time and notify us if they are able to find one.

## 2019-08-27 NOTE — Plan of Care (Signed)
  Problem: Education: Goal: Knowledge of General Education information will improve Description Including pain rating scale, medication(s)/side effects and non-pharmacologic comfort measures Outcome: Progressing   

## 2019-08-27 NOTE — Evaluation (Signed)
Physical Therapy Evaluation Patient Details Name: [name] MRN: [id number] DOB: 04-Apr-1946 Today's Date: 08/27/2019   History of Present Illness  Patient is a 73 year old male admitted with weakness, confusion. swelling and pain left buttock with abcess, sepsis. PMH includes: CVA with L side hemi, DM, CAD, MI, CABG, HTN, parkinsonism, CKD.  Clinical Impression  Patient received in bed, reports he feels like he needs to have BM. Patient assisted to [facility] and back to bed. Patient requires mod assist for bed mobility and min assist for transfers, pivot to recliner with RW. Requires AFO to be donned on left LE. He will continue to benefit from skilled PT to improve strength and functional independence for return home at discharge.     Follow Up Recommendations Home health PT    Equipment Recommendations  None recommended by PT    Recommendations for Other Services       Precautions / Restrictions Precautions Precautions: Fall Required Braces or Orthoses: Other Brace Other Brace: AFO on left Restrictions Weight Bearing Restrictions: No      Mobility  Bed Mobility Overal bed mobility: Needs Assistance Bed Mobility: Supine to Sit;Sit to Supine     Supine to sit: Mod assist Sit to supine: Mod assist   General bed mobility comments: assist to raise trunk to seated position, then upon returning to supine required mod assist to bring legs back up onto bed. Required mod assist for positioning in bed.  Transfers Overall transfer level: Needs assistance Equipment used: Rolling walker (2 wheeled) Transfers: Sit to/from [facility] Sit to Stand: Min assist;From elevated surface Stand pivot transfers: Min assist       General transfer comment: transfered from bed> BSC>bed.  Ambulation/Gait             General Gait Details: unable to walk much this visit as we were unable to locate his right shoe. Has left shoe with AFO, therfore he needs his right shoe.  RN notified and was going to call down to ED to try to find it.  Stairs            Wheelchair Mobility    Modified Rankin (Stroke Patients Only)       Balance Overall balance assessment: Needs assistance Sitting-balance support: Feet supported Sitting balance-Leahy Scale: Fair     Standing balance support: Single extremity supported;During functional activity Standing balance-Leahy Scale: Fair Standing balance comment: reliant on walker for assist                             Pertinent Vitals/Pain Pain Assessment: No/denies pain    Home Living Family/patient expects to be discharged to:: Private residence Living Arrangements: Spouse/significant other Available Help at Discharge: Family;Available 24 hours/day Type of Home: House Home Access: Stairs to enter Entrance Stairs-Rails: Right Entrance Stairs-Number of Steps: 4-5 Home Layout: One level Home Equipment: Walker - 4 wheels;Cane - single point Additional Comments: patient mainly uses rollator with right UE only. Was fairly independent with mobility prior to this.    Prior Function                 Hand Dominance        Extremity/Trunk Assessment   Upper Extremity Assessment Upper Extremity Assessment: Overall WFL for tasks assessed;LUE deficits/detail LUE Deficits / Details: contracture and unable to use due to prior CVA LUE Coordination: decreased gross motor;decreased fine motor    Lower Extremity Assessment Lower Extremity  Assessment: Generalized weakness    Cervical / Trunk Assessment Cervical / Trunk Assessment: Normal  Communication   Communication: No difficulties  Cognition Arousal/Alertness: Awake/alert Behavior During Therapy: WFL for tasks assessed/performed Overall Cognitive Status: Within Functional Limits for tasks assessed                                        General Comments      Exercises     Assessment/Plan    PT Assessment Patient  needs continued PT services  PT Problem List Decreased mobility;Decreased balance;Decreased strength       PT Treatment Interventions Therapeutic activities;Gait training;Therapeutic exercise;Patient/family education;Functional mobility training    PT Goals (Current goals can be found in the Care Plan section)  Acute Rehab PT Goals Patient Stated Goal: to return home PT Goal Formulation: With patient Time For Goal Achievement: 09/10/19 Potential to Achieve Goals: Good    Frequency Min 3X/week   Barriers to discharge        Co-evaluation               AM-PAC PT "6 Clicks" Mobility  Outcome Measure Help needed turning from your back to your side while in a flat bed without using bedrails?: A Little Help needed moving from lying on your back to sitting on the side of a flat bed without using bedrails?: A Lot Help needed moving to and from a bed to a chair (including a wheelchair)?: A Little Help needed standing up from a chair using your arms (e.g., wheelchair or bedside chair)?: A Little Help needed to walk in hospital room?: A Little Help needed climbing 3-5 steps with a railing? : A Lot 6 Click Score: 16    End of Session Equipment Utilized During Treatment: Gait belt Activity Tolerance: Patient tolerated treatment well Patient left: in bed;with bed alarm set;with call bell/phone within reach Nurse Communication: Mobility status PT Visit Diagnosis: Unsteadiness on feet (R26.81);Muscle weakness (generalized) (M62.81);Difficulty in walking, not elsewhere classified (R26.2);Hemiplegia and hemiparesis;History of falling (Z91.81) Hemiplegia - Right/Left: Left Hemiplegia - dominant/non-dominant: Non-dominant Hemiplegia - caused by: Cerebral infarction    Time: [id number] PT Time Calculation (min) (ACUTE ONLY): 26 min   Charges:   PT Evaluation $PT Eval Moderate Complexity: 1 Mod PT Treatments $Therapeutic Activity: 8-22 mins        Jeffrie Lofstrom, PT,  [facility] 08/27/19,2:03 PM

## 2019-08-27 NOTE — Progress Notes (Signed)
  Echocardiogram 2D Echocardiogram has been performed.  [name] 08/27/2019, 3:50 PM

## 2019-08-28 DIAGNOSIS — R569 Unspecified convulsions: Secondary | ICD-10-CM

## 2019-08-28 DIAGNOSIS — I6930 Unspecified sequelae of cerebral infarction: Secondary | ICD-10-CM

## 2019-08-28 LAB — CBC WITH DIFFERENTIAL/PLATELET
Abs Immature Granulocytes: 0.09 10*3/uL — ABNORMAL HIGH (ref 0.00–0.07)
Basophils Absolute: 0.1 10*3/uL (ref 0.0–0.1)
Basophils Relative: 1 %
Eosinophils Absolute: 0.3 10*3/uL (ref 0.0–0.5)
Eosinophils Relative: 2 %
HCT: 31.8 % — ABNORMAL LOW (ref 39.0–52.0)
Hemoglobin: 10.6 g/dL — ABNORMAL LOW (ref 13.0–17.0)
Immature Granulocytes: 1 %
Lymphocytes Relative: 12 %
Lymphs Abs: 1.5 10*3/uL (ref 0.7–4.0)
MCH: 32.2 pg (ref 26.0–34.0)
MCHC: 33.3 g/dL (ref 30.0–36.0)
MCV: 96.7 fL (ref 80.0–100.0)
Monocytes Absolute: 1.6 10*3/uL — ABNORMAL HIGH (ref 0.1–1.0)
Monocytes Relative: 13 %
Neutro Abs: 8.7 10*3/uL — ABNORMAL HIGH (ref 1.7–7.7)
Neutrophils Relative %: 71 %
Platelets: 297 10*3/uL (ref 150–400)
RBC: 3.29 MIL/uL — ABNORMAL LOW (ref 4.22–5.81)
RDW: 13 % (ref 11.5–15.5)
WBC: 12.3 10*3/uL — ABNORMAL HIGH (ref 4.0–10.5)
nRBC: 0 % (ref 0.0–0.2)

## 2019-08-28 LAB — PHOSPHORUS: Phosphorus: 2.8 mg/dL (ref 2.5–4.6)

## 2019-08-28 LAB — GLUCOSE, CAPILLARY
Glucose-Capillary: 182 mg/dL — ABNORMAL HIGH (ref 70–99)
Glucose-Capillary: 305 mg/dL — ABNORMAL HIGH (ref 70–99)

## 2019-08-28 LAB — MAGNESIUM: Magnesium: 1.9 mg/dL (ref 1.7–2.4)

## 2019-08-28 LAB — BASIC METABOLIC PANEL
Anion gap: 14 (ref 5–15)
BUN: 35 mg/dL — ABNORMAL HIGH (ref 8–23)
CO2: 21 mmol/L — ABNORMAL LOW (ref 22–32)
Calcium: 8.7 mg/dL — ABNORMAL LOW (ref 8.9–10.3)
Chloride: 103 mmol/L (ref 98–111)
Creatinine, Ser: 1.72 mg/dL — ABNORMAL HIGH (ref 0.61–1.24)
GFR calc Af Amer: 44 mL/min — ABNORMAL LOW (ref >60)
GFR calc non Af Amer: 38 mL/min — ABNORMAL LOW (ref >60)
Glucose, Bld: 196 mg/dL — ABNORMAL HIGH (ref 70–99)
Potassium: 4.4 mmol/L (ref 3.5–5.1)
Sodium: 138 mmol/L (ref 135–145)

## 2019-08-28 MED ORDER — DOXYCYCLINE HYCLATE 50 MG PO CAPS: 50.0000 mg | ORAL_CAPSULE | Freq: Two times a day (BID) | ORAL | 0 refills | Status: AC

## 2019-08-28 MED ORDER — FUROSEMIDE 20 MG PO TABS: 20.0000 mg | ORAL_TABLET | Freq: Every day | ORAL | 0 refills | Status: DC

## 2019-08-28 NOTE — Discharge Summary (Signed)
Physician Discharge Summary  KOURTLAND SHIEH [id number] DOB: 1946/02/26 DOA: 08/26/2019  PCP: [name], MD  Admit date: 08/26/2019 Discharge date: 08/28/2019  Admitted From: Home. Disposition: Home.  Recommendations for Outpatient Follow-up:  1. Follow up with PCP in 1-2 weeks 2. Please obtain BMP/CBC in one week   Home Health: PT OT Equipment/Devices: None  Discharge Condition: Stable CODE STATUS: Full code Diet recommendation: Low-carb diet  Discharge summary: 73 year old gentleman with history of type 2 diabetes on insulin, coronary artery disease status post CABG in 2004, seizure disorder, carotid artery stenosis, CVA with residual left partial hemiparesis and bladder cancer now in remission, hypertension and Parkinson's, stage III CKD presented to the hospital with about 1 week of painful red swelling of the left buttock, 2 days of coughing and shortness of breath.  Treated with antibiotics at home with no relief.  In the emergency room, afebrile, abscess and induration on the left buttock incised, cultures pending, started on broad-spectrum antibiotics.  Chest x-ray with fluid in the fissures, proBNP elevated to 400.   Left buttock abscess: Status post I&D, cultures no growth so far.  Clinically improved.  Has some induration but no abscess or underlying collection.  continue local care, sitz bath.  With clinical improvement, We will discharge patient on doxycycline for 7 days.  Patient also has prescription for metronidazole that he will continue.  Acute diastolic congestive heart failure: Probably due to longstanding hypertension and history of CABG.  Currently euvolemic.  Echocardiogram shows normal ejection fraction, grade 1 diastolic dysfunction.  Treated with IV Lasix with good response.   Patient is already on irbesartan.  Beta-blockers.  Will start patient on low-dose Lasix 20 mg daily.   Troponins were negative.  EKG nonischemic.   Will need close monitoring  of renal functions as he has stage III chronic kidney disease with baseline creatinine about 1.6-1.7.  Other chronic medical issues remained stable.  Patient will benefit with PT OT at home because of deconditioning, aggravation of chronic medical issues with acute problems. Blood sugars are stable.  Blood pressures are stable.  He has seizure disorder he is on Keppra that he will continue.   Discharge Diagnoses:  Principal Problem:   Sepsis ([place]) Active Problems:   Carotid stenosis   Spastic hemiplegia affecting nondominant side (HCC)   Seizure (HCC)   Chronic ischemic right MCA stroke   Hx of CABG   Parkinsonism (HCC)   Essential hypertension   CRI (chronic renal insufficiency), stage 3 (moderate)   History of bladder cancer   Abscess of buttock   Hyponatremia   Normocytic anemia   Hemoptysis   Hip pain, left   CHF exacerbation [facility])    Discharge Instructions  Discharge Instructions    Call MD for:  difficulty breathing, headache or visual disturbances   Complete by: As directed    Call MD for:  temperature >100.4   Complete by: As directed    Diet Carb Modified   Complete by: As directed    Discharge wound care:   Complete by: As directed    No dressing needed, can do hot compression and sitz bath   Increase activity slowly   Complete by: As directed      Allergies as of 08/28/2019      Reactions   Altace [ramipril] Cough      Medication List    TAKE these medications   amLODipine 10 MG tablet Commonly known as: NORVASC Take 1 tablet (10 mg total) by  mouth daily.   carbidopa-levodopa 25-100 MG tablet Commonly known as: SINEMET IR Two tablets in the am, 1 tablet at lunch and 1 tablet in pm.   cholecalciferol 1000 units tablet Commonly known as: VITAMIN D Take 2,000 Units by mouth daily with breakfast.   CoQ-10 50 MG Caps Take by mouth 2 (two) times daily.   dantrolene 50 MG capsule Commonly known as: DANTRIUM TAKE ONE CAPSULE BY MOUTH TWICE A  DAY   dipyridamole-aspirin 200-25 MG 12hr capsule Commonly known as: AGGRENOX TAKE ONE CAPSULE BY MOUTH TWICE A DAY   doxycycline 50 MG capsule Commonly known as: VIBRAMYCIN Take 1 capsule (50 mg total) by mouth 2 (two) times daily for 7 days.   ezetimibe 10 MG tablet Commonly known as: ZETIA TAKE ONE TABLET BY MOUTH DAILY   fenofibrate 48 MG tablet Commonly known as: TRICOR TAKE ONE TABLET BY MOUTH DAILY   Flaxseed (Linseed) 1000 MG Caps Take 1,000 mg by mouth every evening.   Folbic 2.5-25-2 MG Tabs tablet Generic drug: folic acid-pyridoxine-cyancobalamin Take 1 tablet by mouth once daily with food   furosemide 20 MG tablet Commonly known as: LASIX Take 1 tablet (20 mg total) by mouth daily. Start taking on: August 29, 2019   HumaLOG KwikPen 100 UNIT/ML KiwkPen Generic drug: insulin lispro Inject 4 Units into the skin See admin instructions. Takes 3 times daily before meals for sugar over 200 only   hydrALAZINE 25 MG tablet Commonly known as: APRESOLINE TAKE ONE TABLET BY MOUTH TWICE A DAY   Keppra 500 MG tablet Generic drug: levETIRAcetam TAKE ONE TABLET BY MOUTH TWICE A DAY   metoprolol tartrate 25 MG tablet Commonly known as: LOPRESSOR TAKE ONE TABLET BY MOUTH DAILY (KEEP OFFICE VISIT)   nitroGLYCERIN 0.4 MG SL tablet Commonly known as: NITROSTAT Place 1 tablet (0.4 mg total) under the tongue every 5 (five) minutes as needed for chest pain.   olmesartan 20 MG tablet Commonly known as: BENICAR Take 1 tablet (20 mg total) by mouth daily.   OneTouch Delica Lancets 99991111 Misc   OneTouch Verio test strip Generic drug: glucose blood   Rapaflo 8 MG Caps capsule Generic drug: silodosin Take 8 mg by mouth daily with breakfast.   rosuvastatin 20 MG tablet Commonly known as: CRESTOR Take 1 tablet (20 mg total) by mouth daily.   Toujeo SoloStar 300 UNIT/ML Sopn Generic drug: Insulin Glargine (1 Unit Dial)            Discharge Care Instructions   (From admission, onward)         Start     Ordered   08/28/19 0000  Discharge wound care:    Comments: No dressing needed, can do hot compression and sitz bath   08/28/19 1121          Allergies  Allergen Reactions  . Altace [Ramipril] Cough    Consultations:  None   Procedures/Studies: Dg Chest Portable 1 View  Result Date: 08/26/2019 CLINICAL DATA:  Hypoxia EXAM: PORTABLE CHEST 1 VIEW COMPARISON:  05/22/2011 FINDINGS: Postop CABG. Interval development of pulmonary vascular congestion and mild edema. Small bilateral effusions and bibasilar atelectasis. IMPRESSION: Congestive heart failure with mild edema and small bilateral effusions. Electronically Signed   By: Franchot Gallo M.D.   On: 08/26/2019 15:41   Dg Hips Bilat With Pelvis 2v  Result Date: 08/26/2019 CLINICAL DATA:  Pain EXAM: DG HIP (WITH OR WITHOUT PELVIS) 2V BILAT COMPARISON:  None. FINDINGS: Vascular calcifications are noted. There is  mild-to-moderate osteoarthritis involving both hips. There is no acute displaced fracture or dislocation. There is osteopenia. Phleboliths project over the patient's pelvis. IMPRESSION: No acute osseous abnormality. Electronically Signed   By: Constance Holster M.D.   On: 08/26/2019 20:45    Subjective: Patient seen and examined.  No overnight events.  On room air.  No more cough or congestion.  Buttock does not hurt.   Discharge Exam: Vitals:   08/28/19 0443 08/28/19 0719  BP: (!) 116/52 (!) 115/57  Pulse: 67 76  Resp: 20 20  Temp: 98.6 F (37 C) 98.3 F (36.8 C)  SpO2: 94% 92%   Vitals:   08/27/19 1727 08/27/19 2050 08/28/19 0443 08/28/19 0719  BP: (!) 144/61 (!) 156/65 (!) 116/52 (!) 115/57  Pulse:  83 67 76  Resp:  20 20 20   Temp:  98.7 F (37.1 C) 98.6 F (37 C) 98.3 F (36.8 C)  TempSrc:  Oral Oral Oral  SpO2:  92% 94% 92%  Weight:   74 kg   Height:        General: Pt is alert, awake, not in acute distress, sitting in chair, on room  air. Cardiovascular: RRR, S1/S2 +, no rubs, no gallops Respiratory: CTA bilaterally, no wheezing, no rhonchi Abdominal: Soft, NT, ND, bowel sounds + Extremities: no edema, no cyanosis Left sided hemiplegia. Left buttock with 3 x 3 cm induration, no fluctuation, no pus drainage.    The results of significant diagnostics from this hospitalization (including imaging, microbiology, ancillary and laboratory) are listed below for reference.     Microbiology: Recent Results (from the past 240 hour(s))  SARS CORONAVIRUS 2 (TAT 6-24 HRS) Nasopharyngeal Nasopharyngeal Swab     Status: None   Collection Time: 08/26/19  3:16 PM   Specimen: Nasopharyngeal Swab  Result Value Ref Range Status   SARS Coronavirus 2 NEGATIVE NEGATIVE Final    Comment: (NOTE) SARS-CoV-2 target nucleic acids are NOT DETECTED. The SARS-CoV-2 RNA is generally detectable in upper and lower respiratory specimens during the acute phase of infection. Negative results do not preclude SARS-CoV-2 infection, do not rule out co-infections with other pathogens, and should not be used as the sole basis for treatment or other patient management decisions. Negative results must be combined with clinical observations, patient history, and epidemiological information. The expected result is Negative. Fact Sheet for Patients: SugarRoll.be Fact Sheet for Healthcare Providers: https://www.woods-mathews.com/ This test is not yet approved or cleared by the [place] FDA and  has been authorized for detection and/or diagnosis of SARS-CoV-2 by FDA under an Emergency Use Authorization (EUA). This EUA will remain  in effect (meaning this test can be used) for the duration of the COVID-19 declaration under Section 56 4(b)(1) of the Act, 21 U.S.C. section 360bbb-3(b)(1), unless the authorization is terminated or revoked sooner. Performed at [facility], [place] [address] [place],  [place] [address]   Blood culture (routine x 2)     Status: None (Preliminary result)   Collection Time: 08/26/19  4:30 PM   Specimen: BLOOD  Result Value Ref Range Status   Specimen Description BLOOD RIGHT ANTECUBITAL  Final   Special Requests   Final    BOTTLES DRAWN AEROBIC AND ANAEROBIC Blood Culture adequate volume   Culture   Final    NO GROWTH < 24 HOURS Performed at [facility], [place] [address] [place], [place] [address]    Report Status PENDING  Incomplete  Blood culture (routine x 2)  Status: None (Preliminary result)   Collection Time: 08/26/19  4:35 PM   Specimen: BLOOD RIGHT WRIST  Result Value Ref Range Status   Specimen Description BLOOD RIGHT WRIST  Final   Special Requests   Final    BOTTLES DRAWN AEROBIC AND ANAEROBIC Blood Culture results may not be optimal due to an excessive volume of blood received in culture bottles   Culture   Final    NO GROWTH < 24 HOURS Performed at [facility], [place] [address] [place], [place] [address]    Report Status PENDING  Incomplete  Wound or Superficial Culture     Status: None (Preliminary result)   Collection Time: 08/26/19  8:54 PM   Specimen: Abscess; Wound  Result Value Ref Range Status   Specimen Description ABSCESS  Final   Special Requests Normal  Final   Gram Stain   Final    RARE WBC PRESENT, PREDOMINANTLY PMN RARE GRAM POSITIVE COCCI IN CLUSTERS    Culture   Final    NO GROWTH 1 DAY Performed at [place] [address], [place], [place] [address]    Report Status PENDING  Incomplete     Labs: BNP (last 3 results) Recent Labs    08/26/19 1605 08/27/19 0257  BNP 482.4* 123XX123*   Basic Metabolic Panel: Recent Labs  Lab 08/26/19 1620 08/27/19 0257 08/28/19 0421  NA 129* 131* 138  K 4.5 4.3 4.4  CL 97* 101 103  CO2 19* 20* 21*  GLUCOSE 311* 297* 196*  BUN 48* 40* 35*  CREATININE 1.90* 1.80* 1.72*  CALCIUM 8.8* 8.6* 8.7*  MG  --   --  1.9  PHOS  --   --  2.8   Liver  Function Tests: Recent Labs  Lab 08/26/19 1620 08/27/19 0257  AST 18 20  ALT 8 10  ALKPHOS 40 38  BILITOT 0.7 0.7  PROT 6.3* 6.1*  ALBUMIN 2.9* 2.7*   No results for input(s): LIPASE, AMYLASE in the last 168 hours. No results for input(s): AMMONIA in the last 168 hours. CBC: Recent Labs  Lab 08/26/19 1620 08/27/19 0257 08/28/19 0421  WBC 12.5* 12.6* 12.3*  NEUTROABS 9.4*  --  8.7*  HGB 10.6* 10.2* 10.6*  HCT 32.3* 30.2* 31.8*  MCV 97.3 93.8 96.7  PLT 220 246 297   Cardiac Enzymes: No results for input(s): CKTOTAL, CKMB, CKMBINDEX, TROPONINI in the last 168 hours. BNP: Invalid input(s): POCBNP CBG: Recent Labs  Lab 08/27/19 0626 08/27/19 1120 08/27/19 1646 08/27/19 2056 08/28/19 0623  GLUCAP 280* 307* 235* 227* 182*   D-Dimer Recent Labs    08/26/19 1641  DDIMER 2.34*   Hgb A1c Recent Labs    08/27/19 0257  HGBA1C 7.5*   Lipid Profile No results for input(s): CHOL, HDL, LDLCALC, TRIG, CHOLHDL, LDLDIRECT in the last 72 hours. Thyroid function studies No results for input(s): TSH, T4TOTAL, T3FREE, THYROIDAB in the last 72 hours.  Invalid input(s): FREET3 Anemia work up Recent Labs    08/27/19 0257  VITAMINB12 1,521*  FERRITIN 234  TIBC 231*  IRON 24*   Urinalysis    Component Value Date/Time   COLORURINE YELLOW 05/22/2011 [place] 05/22/2011 1129   LABSPEC 1.014 05/22/2011 1129   PHURINE 7.5 05/22/2011 1129   GLUCOSEU NEGATIVE 05/22/2011 1129   HGBUR NEGATIVE 05/22/2011 1129   [place] 05/22/2011 1129   [place] 05/22/2011 1129   PROTEINUR NEGATIVE 05/22/2011 1129   UROBILINOGEN 0.2  05/22/2011 1129   NITRITE NEGATIVE 05/22/2011 1129   LEUKOCYTESUR NEGATIVE 05/22/2011 1129   Sepsis Labs Invalid input(s): PROCALCITONIN,  WBC,  LACTICIDVEN Microbiology Recent Results (from the past 240 hour(s))  SARS CORONAVIRUS 2 (TAT 6-24 HRS) Nasopharyngeal Nasopharyngeal Swab     Status: None   Collection  Time: 08/26/19  3:16 PM   Specimen: Nasopharyngeal Swab  Result Value Ref Range Status   SARS Coronavirus 2 NEGATIVE NEGATIVE Final    Comment: (NOTE) SARS-CoV-2 target nucleic acids are NOT DETECTED. The SARS-CoV-2 RNA is generally detectable in upper and lower respiratory specimens during the acute phase of infection. Negative results do not preclude SARS-CoV-2 infection, do not rule out co-infections with other pathogens, and should not be used as the sole basis for treatment or other patient management decisions. Negative results must be combined with clinical observations, patient history, and epidemiological information. The expected result is Negative. Fact Sheet for Patients: SugarRoll.be Fact Sheet for Healthcare Providers: https://www.woods-mathews.com/ This test is not yet approved or cleared by the [place] FDA and  has been authorized for detection and/or diagnosis of SARS-CoV-2 by FDA under an Emergency Use Authorization (EUA). This EUA will remain  in effect (meaning this test can be used) for the duration of the COVID-19 declaration under Section 56 4(b)(1) of the Act, 21 U.S.C. section 360bbb-3(b)(1), unless the authorization is terminated or revoked sooner. Performed at [facility], [place] [address] [place], [place] [address]   Blood culture (routine x 2)     Status: None (Preliminary result)   Collection Time: 08/26/19  4:30 PM   Specimen: BLOOD  Result Value Ref Range Status   Specimen Description BLOOD RIGHT ANTECUBITAL  Final   Special Requests   Final    BOTTLES DRAWN AEROBIC AND ANAEROBIC Blood Culture adequate volume   Culture   Final    NO GROWTH < 24 HOURS Performed at [facility], [place] [address] [place], [place] [address]    Report Status PENDING  Incomplete  Blood culture (routine x 2)     Status: None (Preliminary result)   Collection Time: 08/26/19  4:35 PM   Specimen: BLOOD RIGHT  WRIST  Result Value Ref Range Status   Specimen Description BLOOD RIGHT WRIST  Final   Special Requests   Final    BOTTLES DRAWN AEROBIC AND ANAEROBIC Blood Culture results may not be optimal due to an excessive volume of blood received in culture bottles   Culture   Final    NO GROWTH < 24 HOURS Performed at [facility], [place] [address], [place], [place] [address]    Report Status PENDING  Incomplete  Wound or Superficial Culture     Status: None (Preliminary result)   Collection Time: 08/26/19  8:54 PM   Specimen: Abscess; Wound  Result Value Ref Range Status   Specimen Description ABSCESS  Final   Special Requests Normal  Final   Gram Stain   Final    RARE WBC PRESENT, PREDOMINANTLY PMN RARE GRAM POSITIVE COCCI IN CLUSTERS    Culture   Final    NO GROWTH 1 DAY Performed at [place] [address], [place], [place] [address]    Report Status PENDING  Incomplete     Time coordinating discharge: 40 minutes  SIGNED:   Barb Merino, MD  [facility] 08/28/2019, 11:21 AM

## 2019-08-28 NOTE — Progress Notes (Signed)
Contacted CM about patient HH need PT/OT.

## 2019-08-28 NOTE — TOC Transition Note (Signed)
Transition of Care [facility]) - CM/SW Discharge Note   Patient Details  Name: REYNA HAMEED MRN: [id number] Date of Birth: [birth year]  [facility] of [facility]) CM/SW Contact:  Carles Collet, RN Phone Number: 08/28/2019, 11:59 AM   Clinical Narrative:   Damaris Schooner w patient over the phone, he deferred conversation to his "caregiver." Caregiver is his ex wife of 53 years with whom he lives. She explained in great detail his functional level PTA and set up they have at home. She is comfortable continuing to provide assistance and declined PT OT. She states they have all needed DME at home. No other CM needs identified.      Final next level of care: Home/Self Care Barriers to Discharge: No Barriers Identified   Patient Goals and CMS Choice Patient states their goals for this hospitalization and ongoing recovery are:: to go home      Discharge Placement                       Discharge Plan and Services                                     Social Determinants of Health (SDOH) Interventions     Readmission Risk Interventions No flowsheet data found.

## 2019-08-28 NOTE — Evaluation (Signed)
Occupational Therapy Evaluation Patient Details Name: [name] MRN: [id number] DOB: June 29, 1946 Today's Date: 08/28/2019    History of Present Illness Patient is a 73 year old male admitted with weakness, confusion. swelling and pain left buttock with abcess, sepsis. PMH includes: CVA with L side hemi, DM, CAD, MI, CABG, HTN, parkinsonism, CKD.   Clinical Impression   PTA patient reports able to complete mobility and transfers with independence using rollator, ADLs with independence (although ex-wife assists with bathing in shower) and requires assist for IADLs.  Patient admitted for above and is limited by problem list below, including chronic L sided hemiparesis, impaired balance, generalized weakness.  He requires mod assist for bed mobility, mod assist for stand pivot transfers, mod-max assist for UB/LB ADLs and min assist for grooming.  He reports ex-wife provides 24/7 support and can assist as needed.  He will benefit from continued OT services while admitted and after dc at [facility] level in order to optimize independence and return to PLOF with ADLs/mobility.     Follow Up Recommendations  Home health OT;Supervision/Assistance - 24 hour    Equipment Recommendations  None recommended by OT    Recommendations for Other Services       Precautions / Restrictions Precautions Precautions: Fall Required Braces or Orthoses: Other Brace Other Brace: AFO on left Restrictions Weight Bearing Restrictions: No      Mobility Bed Mobility Overal bed mobility: Needs Assistance Bed Mobility: Supine to Sit     Supine to sit: Mod assist     General bed mobility comments: mod assist for L LE mgmt and trunk elevation to EOB, increased time and effort  Transfers Overall transfer level: Needs assistance Equipment used: 1 person hand held assist Transfers: Sit to/from [facility] Sit to Stand: Mod assist Stand pivot transfers: Mod assist       General transfer  comment: mod assist to power up and steady from EOB, transitioned to recliner with cueing for technique, steadying support    Balance Overall balance assessment: Needs assistance Sitting-balance support: No upper extremity supported;Feet supported Sitting balance-Leahy Scale: Fair     Standing balance support: Single extremity supported;During functional activity Standing balance-Leahy Scale: Fair Standing balance comment: relaint on R UE and external support                           ADL either performed or assessed with clinical judgement   ADL Overall ADL's : Needs assistance/impaired Eating/Feeding: Set up;Sitting   Grooming: Minimal assistance;Sitting   Upper Body Bathing: Moderate assistance;Sitting   Lower Body Bathing: Sit to/from stand;Maximal assistance   Upper Body Dressing : Moderate assistance;Sitting   Lower Body Dressing: Maximal assistance;Sit to/from stand   Toilet Transfer: Moderate assistance;Stand-pivot Toilet Transfer Details (indicate cue type and reason): simulated to recliner          Functional mobility during ADLs: Moderate assistance;Cueing for safety;Cueing for sequencing General ADL Comments: pt limited by chronic L hemiparesis, impaired balance and generalized weakness     Vision   Vision Assessment?: No apparent visual deficits     Perception     Praxis      Pertinent Vitals/Pain Pain Assessment: No/denies pain     Hand Dominance Right   Extremity/Trunk Assessment Upper Extremity Assessment Upper Extremity Assessment: LUE deficits/detail;Generalized weakness LUE Deficits / Details: hx of CVA with noted contractures at MCP, elbow; pt reports no functional use  LUE Coordination: decreased fine motor;decreased gross motor  Lower Extremity Assessment Lower Extremity Assessment: Defer to PT evaluation       Communication Communication Communication: No difficulties   Cognition Arousal/Alertness:  Awake/alert Behavior During Therapy: WFL for tasks assessed/performed Overall Cognitive Status: Within Functional Limits for tasks assessed                                     General Comments       Exercises     Shoulder Instructions      Home Living Family/patient expects to be discharged to:: Private residence Living Arrangements: Spouse/significant other(ex wife) Available Help at Discharge: Family;Available 24 hours/day Type of Home: House Home Access: Stairs to enter [employer] of Steps: 4-5 Entrance Stairs-Rails: Right Home Layout: One level     Bathroom Shower/Tub: Occupational psychologist: Handicapped height     Home Equipment: Environmental consultant - 4 wheels;Cane - single point;Shower seat;Grab bars - tub/shower;Bedside commode          Prior Functioning/Environment Level of Independence: Independent with assistive device(s)        Comments: used rollator for mobility (although does not fit in bathroom), independent dressing but assist with bathing, assist with IADLs         OT Problem List: Decreased strength;Decreased activity tolerance;Impaired balance (sitting and/or standing);Decreased coordination      OT Treatment/Interventions: Self-care/ADL training;DME and/or AE instruction;Therapeutic activities;Balance training;Patient/family education;Therapeutic exercise    OT Goals(Current goals can be found in the care plan section) Acute Rehab OT Goals Patient Stated Goal: to return home OT Goal Formulation: With patient Time For Goal Achievement: 09/11/19 Potential to Achieve Goals: Good  OT Frequency: Min 2X/week   Barriers to D/C:            Co-evaluation              AM-PAC OT "6 Clicks" Daily Activity     Outcome Measure Help from another person eating meals?: A Little Help from another person taking care of personal grooming?: A Little Help from another person toileting, which includes using toliet, bedpan,  or urinal?: A Lot Help from another person bathing (including washing, rinsing, drying)?: A Lot Help from another person to put on and taking off regular upper body clothing?: A Lot Help from another person to put on and taking off regular lower body clothing?: A Lot 6 Click Score: 14   End of Session Equipment Utilized During Treatment: Gait belt Nurse Communication: Mobility status  Activity Tolerance: Patient tolerated treatment well Patient left: in chair;with call bell/phone within reach;with chair alarm set  OT Visit Diagnosis: Other abnormalities of gait and mobility (R26.89);Muscle weakness (generalized) (M62.81);Hemiplegia and hemiparesis Hemiplegia - Right/Left: Left Hemiplegia - dominant/non-dominant: Non-Dominant Hemiplegia - caused by: Cerebral infarction(chronic)                [id number] OT Time Calculation (min): 23 min Charges:  OT General Charges $OT Visit: 1 Visit OT Evaluation $OT Eval Moderate Complexity: 1 Mod OT Treatments $Self Care/Home Management : 8-22 mins  Delight Stare, OT Acute Rehabilitation Services Pager [phone] Office [phone]   Delight Stare 08/28/2019, 10:20 AM

## 2019-08-28 NOTE — Plan of Care (Signed)
  Problem: Education: Goal: Knowledge of General Education information will improve Description: Including pain rating scale, medication(s)/side effects and non-pharmacologic comfort measures Outcome: Progressing   Problem: Health Behavior/Discharge Planning: Goal: Ability to manage health-related needs will improve Outcome: Progressing   Problem: Activity: Goal: Risk for activity intolerance will decrease Outcome: Progressing   

## 2019-08-28 NOTE — Progress Notes (Signed)
Patient discharged: Home with family  Via: Wheelchair   Discharge paperwork given: to patient and family  Reviewed with teach back  IV and telemetry disconnected  Belongings given to patient    

## 2019-08-29 LAB — FOLATE RBC
Folate, Hemolysate: >620 ng/mL
Folate, RBC: >2026 ng/mL (ref >498)
Hematocrit: 30.6 % — ABNORMAL LOW (ref 37.5–51.0)

## 2019-08-30 LAB — AEROBIC CULTURE W GRAM STAIN (SUPERFICIAL SPECIMEN)
Culture: NO GROWTH
Special Requests: NORMAL

## 2019-08-31 LAB — CULTURE, BLOOD (ROUTINE X 2)
Culture: NO GROWTH
Culture: NO GROWTH
Special Requests: ADEQUATE

## 2019-09-01 ENCOUNTER — Other Ambulatory Visit: Payer: Self-pay

## 2019-09-01 MED ORDER — DANTROLENE SODIUM 50 MG PO CAPS: 50.0000 mg | ORAL_CAPSULE | Freq: Two times a day (BID) | ORAL | 1 refills | Status: DC

## 2019-09-01 NOTE — Telephone Encounter (Signed)
We would really need to see what is actually going on with his hand and arm to make appropriate recommendations

## 2019-09-01 NOTE — Telephone Encounter (Signed)
Patients wife called for patient today, stated needed a refill of dantrolene.  Refilled per clinical protocol.  Also that his hand on the non-functional side of his body has started turning away from the body recently and is asking is there anything that can be done for that.

## 2019-09-01 NOTE — Telephone Encounter (Signed)
Message relayed to patient.  Next appointment scheduled for 09-21-2019

## 2019-09-04 NOTE — Progress Notes (Signed)
Virtual Visit via Telephone Note   This visit type was conducted due to national recommendations for restrictions regarding the COVID-19 Pandemic (e.g. social distancing) in an effort to limit this patient's exposure and mitigate transmission in our community.  Due to his co-morbid illnesses, this patient is at least at moderate risk for complications without adequate follow up.  This format is felt to be most appropriate for this patient at this time.  The patient did not have access to video technology/had technical difficulties with video requiring transitioning to audio format only (telephone).  All issues noted in this document were discussed and addressed.  No physical exam could be performed with this format.  Please refer to the patient's chart for his  consent to telehealth for [facility].   Date:  09/05/2019   ID:  KRISHAV LASSILA, DOB [birth year], MRN [id number]  Patient Location: Home Provider Location: Home  PCP:  [name], MD  Cardiologist:  Quay Burow, MD Electrophysiologist:  None   Evaluation Performed:  Follow-Up Visit  Chief Complaint:  [facility] follow up  History of Present Illness:    [name] is a 73 y.o. male we we are following for ongoing assessment and management of hypertension, hyperlipidemia, PAD with bilateral carotid endarterectomies in 1999 with bilateral carotid stenting performed by Dr. Trula Slade and Dr. Alvester Chou in a staged fashion because of restenosis in 2012.  Coronary artery disease status post coronary artery bypass grafting in 2004 by Dr. Roxan Hockey with follow-up nuclear medicine stress test in 2012 which was nonischemic.    Patient also has a history of Parkinson disease which is been progressive, he is now wheelchair-bound.  Patient also has a history of bladder cancer and has had 4 bladder surgeries.  Additional history includes type 2 diabetes on insulin, seizure disorder,, chronic kidney disease stage III.  He was  recently discharged from the hospital on 08/28/2019 after admission for wound to the left buttock, along with 2 days of coughing and shortness of breath.  He has been treated at home with antibiotics but did not find relief.  He was found to have an abscess and induration on the left buttock which was incised.  Cultures were pending.  Chest x-ray revealed fluid in the fissures proBNP was elevated at 400.  He was to be followed by wound clinic as outpatient, continued on doxycycline for 7 days, and metronidazole.   Additionally he was treated for acute diastolic heart failure echocardiogram revealed normal EF with grade 1 diastolic dysfunction.  He was diuresed with IV furosemide, and started on Lasix 20 mg daily on discharge.  Creatinine on discharge 1.6-1.7.  The patient's caregiver (and ex-wife) speaks with me on the phone on speaker along with Mr. Harbold higher today.  She tells me that he is doing much better, his weight is being maintained between 149 and 151 pounds which was what he weighed when he left the hospital.  His initial wound has healed however she states that his right side has begun to show an area that is becoming inflamed and red.  They are due to follow-up next week with a colorectal surgeon, who will examine his site more thoroughly.    The patient does not have symptoms concerning for COVID-19 infection (fever, chills, cough, or new shortness of breath).    Past Medical History:  Diagnosis Date  . At high risk for falls   . Bladder cancer [facility])    urologist-  dr Gaynelle Arabian  .  CAD (coronary artery disease) cardiologist-  dr berry  . Cerebrovascular arteriosclerosis   . Gait disturbance, post-stroke    uses cane, rollator, and w/c long distance  . Hemiparkinsonism [facility]) neurologist-- dr Leta Baptist   right side body  . History of basal cell carcinoma excision    Sept 2016--  MOH's surgery side of nose  . History of carotid artery stenosis cardiologist-- dr berry    bilateral --  1999 s/p  bilateral ICA endarterectomy and recurrent restenosis 2012  s/p  staged bilateral stenting   per last duplex 2015  stents widely patent  . History of CVA with residual deficit neurologist-  dr Leta Baptist   3/ 1999  Right MCA  and  12/ 2004  Anterior division of  Right MCA ---  residual left spactic hemiparesis and left foot drop  . History of MI (myocardial infarction)    02/ 2004   s/p  cabg   . HOH (hard of hearing)    LEFT EAR  POST cva  . Hyperextension deformity of left knee    wears brace  . Hyperlipidemia   . Hypertension   . Left foot drop    residual from CVA  -- wears brace  . Left spastic hemiparesis ([place])    residual CVA 1999  . Pancreatic pseudocyst   . Peripheral arterial disease ([place])   . S/P CABG x 4    02/ 2004  . Seizure disorder [facility]) last seizure 2011 due to confusion   neurologist-  dr Leta Baptist-- per note seizure documented 2008 breakthrough partial complex seizure (prior tonic-clonic seizure's post CVA)  . Type 2 diabetes, diet controlled ([place])     2  . Visual neglect    LEFT EYE   Past Surgical History:  Procedure Laterality Date  . CARDIAC CATHETERIZATION  11/17/2002   significant left main disease and 3 vessel CAD, mildly depressed LV systolic  fx, 123456 left renal artery stenosis  . CAROTID ENDARTERECTOMY  12/1997   [facility])   Bilateral ICA  . CAROTID STENT INSERTION Bilateral dr berry and dr Kristine Royal--  (In-stent Restenosis)  LeftICA   02-12-2011;  [place]   02-26-2011  . CORONARY ARTERY BYPASS GRAFT  2/ 2004   dr hendrickson   x3 LIMA to LAD,free right internal mammary artery to obtuse marginal 1,SVG to distal right coronary.  . CYSTOSCOPY W/ RETROGRADES Bilateral 09/10/2015   Procedure: CYSTOSCOPY WITH RETROGRADE PYELOGRAM;  Surgeon: Carolan Clines, MD;  Location: [facility];  Service: Urology;  Laterality: Bilateral;  . CYSTOSCOPY WITH BIOPSY N/A 09/10/2015   Procedure: CYSTOSCOPY WITH BIOPSY,RIGHT  TRIGONAL TUMOR 1.5 CM, EXCISIONAL BIOPSY WITH TAUBER FORCEP RIGHT POSTERIOR BLADDER WALL 1 CM, EXCISIONAL BIOPSY SATELITE BLADDER WALL 1 CM;  Surgeon: Carolan Clines, MD;  [facility];  Service: Urology;  Laterality: N/A;  . CYSTOSCOPY WITH BIOPSY N/A 03/26/2017   Procedure: CYSTOSCOPY WITH BIOPSY/;  Surgeon: Carolan Clines, MD;  Location: [facility];  Service: Urology;  Laterality: N/A;  . CYSTOSCOPY WITH HYDRODISTENSION AND BIOPSY N/A 10/29/2015   Procedure: REPEAT CYSTOSCOPY BIOPSY BLADDER DEEP MUSCLES BLADDER BIOPSY;  Surgeon: Carolan Clines, MD;  Location: [facility];  Service: Urology;  Laterality: N/A;  . CYSTOSCOPY WITH URETHRAL DILATATION N/A 09/10/2015   Procedure: CYSTOSCOPY WITH URETHRAL DILATATION;  Surgeon: Carolan Clines, MD;  Location: [place];  Service: Urology;  Laterality: N/A;  . FULGURATION OF BLADDER TUMOR N/A 03/26/2017   Procedure: FULGURATION OF BLADDER TUMOR;  Surgeon: Carolan Clines, MD;  Location: [facility];  Service: Urology;  Laterality: N/A;  . [place]  . MOHS SURGERY  06-20-2015   side of nose  . NM MYOCAR PERF WALL MOTION  01/09/2011   dr berry   mild to mod. perfusion defect in the basal inferolateral & mid inferolaterl regions consistant with an infarct/scar, no signigicant ischemia demonstracted/  Low Risk scan, no sig. change from previous study/  normal LV function and wall motion , ef 63%  . ORIF RIGHT ANKLE FX  1998   hardware retained  . TRANSURETHRAL RESECTION OF BLADDER TUMOR N/A 09/10/2015   Procedure: CYSTO TRANSURETHRAL RESECTION OF BLADDER TUMOR (TURBT) OF LEFT POSTERIOR BLADDER TUMOR 2 CM;  Surgeon: Carolan Clines, MD;  Location: [facility];  Service: Urology;  Laterality: N/A;  . TRANSURETHRAL RESECTION OF BLADDER TUMOR Left 04/17/2016   Procedure: TRANSURETHRAL RESECTION OF BLADDER TUMOR (TURBT);  Surgeon: Carolan Clines, MD;   Location: [facility];  Service: Urology;  Laterality: Left;  . US ECHOCARDIOGRAPHY  01/03/2011   normal LVF, ef>55%/  mild LAE/ trace MR and TR,  mild AV sclerosis without stenosis     Current Meds  Medication Sig  . amLODipine (NORVASC) 10 MG tablet Take 1 tablet (10 mg total) by mouth daily.  . carbidopa-levodopa (SINEMET IR) 25-100 MG tablet Two tablets in the am, 1 tablet at lunch and 1 tablet in pm.  . cholecalciferol (VITAMIN D) 1000 units tablet Take 2,000 Units by mouth daily with breakfast.   . Coenzyme Q10 (COQ-10) 50 MG CAPS Take by mouth 2 (two) times daily.   . dantrolene (DANTRIUM) 50 MG capsule Take 1 capsule (50 mg total) by mouth 2 (two) times daily.  Marland Kitchen dipyridamole-aspirin (AGGRENOX) 200-25 MG 12hr capsule TAKE ONE CAPSULE BY MOUTH TWICE A DAY  . ezetimibe (ZETIA) 10 MG tablet TAKE ONE TABLET BY MOUTH DAILY  . fenofibrate (TRICOR) 48 MG tablet TAKE ONE TABLET BY MOUTH DAILY  . Flaxseed, Linseed, 1000 MG CAPS Take 1,000 mg by mouth every evening.   Marland Kitchen FOLBIC 2.5-25-2 MG TABS tablet Take 1 tablet by mouth once daily with food  . furosemide (LASIX) 20 MG tablet Take 1 tablet (20 mg total) by mouth daily.  . hydrALAZINE (APRESOLINE) 25 MG tablet TAKE ONE TABLET BY MOUTH TWICE A DAY  . insulin lispro (HUMALOG KWIKPEN) 100 UNIT/ML KiwkPen Inject 4 Units into the skin See admin instructions. Takes 3 times daily before meals for sugar over 200 only  . KEPPRA 500 MG tablet TAKE ONE TABLET BY MOUTH TWICE A DAY  . metoprolol tartrate (LOPRESSOR) 25 MG tablet TAKE ONE TABLET BY MOUTH DAILY (KEEP OFFICE VISIT)  . nitroGLYCERIN (NITROSTAT) 0.4 MG SL tablet Place 1 tablet (0.4 mg total) under the tongue every 5 (five) minutes as needed for chest pain.  Marland Kitchen olmesartan (BENICAR) 20 MG tablet Take 1 tablet (20 mg total) by mouth daily.  Glory Rosebush DELICA LANCETS [id number] MISC   . ONETOUCH VERIO test strip   . RAPAFLO 8 MG CAPS capsule Take 8 mg by mouth daily with breakfast.   . rosuvastatin (CRESTOR)  20 MG tablet Take 1 tablet (20 mg total) by mouth daily.  Nelva Nay SOLOSTAR 300 UNIT/ML SOPN      Allergies:   Altace [ramipril]   Social History   Tobacco Use  . Smoking status: Former Smoker    Years: 5.00    Types: Cigarettes    Quit date: 11/14/2007  Years since quitting: 11.8  . Smokeless tobacco: Never Used  Substance Use Topics  . Alcohol use: No  . Drug use: No     Family Hx: The patient's family history includes Diabetes in his brother; Heart attack in his father; Hyperlipidemia in his brother and sister; Hypertension in his brother and sister; Stroke in his father and mother.  ROS:   Please see the history of present illness.    All other systems reviewed and are negative.   Prior CV studies:   The following studies were reviewed today: Echo 08/27/2019 1. Left ventricular ejection fraction, by visual estimation, is 60 to 65%. The left ventricle has normal function. There is moderately increased left ventricular hypertrophy.  2. Left ventricular diastolic parameters are consistent with Grade I diastolic dysfunction (impaired relaxation).  3. Global right ventricle has normal systolic function.The right ventricular size is normal. No increase in right ventricular wall thickness.  4. Left atrial size was mildly dilated.  5. Right atrial size was normal.  6. Mild mitral annular calcification.  7. The mitral valve is abnormal. Mild mitral valve regurgitation.  8. The tricuspid valve is grossly normal. Tricuspid valve regurgitation is trivial.  9. The aortic valve is tricuspid. Aortic valve regurgitation is not visualized. Mild aortic valve sclerosis without stenosis. 10. The pulmonic valve was grossly normal. Pulmonic valve regurgitation is trivial. 11. Mildly elevated pulmonary artery systolic pressure. 12. The inferior vena cava is normal in size with <50% respiratory variability, suggesting right atrial pressure of 8 mmHg.  Labs/Other Tests and Data Reviewed:     EKG:  No ECG reviewed.  Recent Labs: 08/27/2019: ALT 10; B Natriuretic Peptide 381.5 08/28/2019: BUN 35; Creatinine, Ser 1.72; Hemoglobin 10.6; Magnesium 1.9; Platelets 297; Potassium 4.4; Sodium 138   Recent Lipid Panel Lab Results  Component Value Date/Time   CHOL 137 10/08/2017 08:20 AM   TRIG 149 10/08/2017 08:20 AM   HDL 40 10/08/2017 08:20 AM   CHOLHDL 3.4 10/08/2017 08:20 AM   CHOLHDL 9.1 (H) 10/22/2016 08:21 AM   LDLCALC 67 10/08/2017 08:20 AM   LDLDIRECT 78 10/22/2016 08:21 AM    Wt Readings from Last 3 Encounters:  09/05/19 150 lb (68 kg)  08/28/19 163 lb 2.3 oz (74 kg)  08/02/19 160 lb (72.6 kg)     Objective:    Vital Signs:  BP (!) 147/65   Pulse 64   Ht 5\' 6"  (1.676 m)   Wt 150 lb (68 kg)   BMI 24.21 kg/m    VITAL SIGNS:  reviewed  Due to telephone visit, no assessment was completed.  ASSESSMENT & PLAN:    1.  Hypertension: Blood pressure slightly elevated today.  He remains on hydralazine 25 mg twice a day, metoprolol 25 mg daily, amlodipine 10 mg daily, and Benicar 20 mg daily.  Labs were drawn today by his urologist.  May consider stopping ARB if kidney function worsens and increase hydralazine instead for blood pressure control.  2.  Chronic diastolic heart failure: New onset noted during his recent hospitalization for wound infection.  He remains on Lasix 20 mg daily.  Awaiting labs to evaluate his kidney function and need to continue.  He continues to stay hydrated and avoid salt.  3.  Chronic kidney disease stage III: Creatinine on discharge 1 week ago 1.6-1.7.  Follow-up labs were drawn today by urology.  We are requesting copies once they are available.  4.  Wound abscess of his buttocks: Recent hospitalization for same with  antibiotic therapy.  I&D was completed on the left.  His caregiver states that a new place is forming on the right.  He will follow-up with his surgeon on September 12, 2019.  She is advised to have him seen sooner if the new  area worsens or becomes extremely painful.  COVID-19 Education: The signs and symptoms of COVID-19 were discussed with the patient and how to seek care for testing (follow up with PCP or arrange E-visit).  The importance of social distancing was discussed today.  Time:   Today, I have spent 20 minutes with the patient with telehealth technology discussing the above problems.     Medication Adjustments/Labs and Tests Ordered: Current medicines are reviewed at length with the patient today.  Concerns regarding medicines are outlined above.   Tests Ordered: No orders of the defined types were placed in this encounter.   Medication Changes: No orders of the defined types were placed in this encounter.   Disposition:  Follow up January 2021   SignedPhill Myron. West Pugh, ANP, AACC  09/05/2019 2:17 PM    [facility]

## 2019-09-05 ENCOUNTER — Encounter: Payer: Self-pay | Admitting: Adult Health

## 2019-09-05 ENCOUNTER — Telehealth (INDEPENDENT_AMBULATORY_CARE_PROVIDER_SITE_OTHER): Payer: Medicare Other | Admitting: Adult Health

## 2019-09-05 VITALS — BP 147/65 | HR 64 | Ht 66.0 in | Wt 150.0 lb

## 2019-09-05 DIAGNOSIS — Z951 Presence of aortocoronary bypass graft: Secondary | ICD-10-CM

## 2019-09-05 DIAGNOSIS — I10 Essential (primary) hypertension: Secondary | ICD-10-CM

## 2019-09-05 DIAGNOSIS — L0231 Cutaneous abscess of buttock: Secondary | ICD-10-CM

## 2019-09-05 DIAGNOSIS — N183 Chronic kidney disease, stage 3 unspecified: Secondary | ICD-10-CM

## 2019-09-05 DIAGNOSIS — I5032 Chronic diastolic (congestive) heart failure: Secondary | ICD-10-CM

## 2019-09-05 MED ORDER — FUROSEMIDE 20 MG PO TABS: 20.0000 mg | ORAL_TABLET | Freq: Every day | ORAL | 3 refills | Status: DC

## 2019-09-05 NOTE — Patient Instructions (Signed)
Medication Instructions:  Continue current medications  *If you need a refill on your cardiac medications before your next appointment, please call your pharmacy*  Lab Work: None Ordered  Testing/Procedures: None Ordered  Follow-Up: At [facility], you and your health needs are our priority.  As part of our continuing mission to provide you with exceptional heart care, we have created designated Provider Care Teams.  These Care Teams include your primary Cardiologist (physician) and Advanced Practice Providers (APPs -  Physician Assistants and Nurse Practitioners) who all work together to provide you with the care you need, when you need it.  Your next appointment:   January 22nd @ 1:30 pm  The format for your next appointment:   In Person  Provider:   Kerin Ransom, PA-C

## 2019-09-14 ENCOUNTER — Other Ambulatory Visit: Payer: Self-pay | Admitting: Cardiovascular Disease

## 2019-09-20 ENCOUNTER — Ambulatory Visit: Payer: Medicare Other | Admitting: Cardiovascular Disease

## 2019-09-21 ENCOUNTER — Encounter: Payer: Medicare Other | Attending: Physical Medicine & Rehabilitation | Admitting: Physical Medicine & Rehabilitation

## 2019-09-21 ENCOUNTER — Other Ambulatory Visit: Payer: Self-pay

## 2019-09-21 ENCOUNTER — Encounter: Payer: Self-pay | Admitting: Physical Medicine & Rehabilitation

## 2019-09-21 VITALS — BP 150/69 | HR 58 | Temp 97.5°F | Ht 64.0 in | Wt 151.0 lb

## 2019-09-21 DIAGNOSIS — G8110 Spastic hemiplegia affecting unspecified side: Secondary | ICD-10-CM | POA: Diagnosis present

## 2019-09-21 DIAGNOSIS — G218 Other secondary parkinsonism: Secondary | ICD-10-CM

## 2019-09-21 NOTE — Progress Notes (Signed)
Subjective:    Patient ID: [name], male    DOB: [birth year], 73 y.o.   MRN: [id number]  HPI  [name] is here in follow up of his chronic spastic left hemiparesis. He has been dealing with CHF and fluid overload which required hospitliization.   He has noticed that his left hand has been extending at the wrist. She was unsure if it is affecting his balance. She has found that after his second stroke he struggles focusing on more than one thing at a time.   He remains on dantrium 50mg  bid which has worked well for him. We last did botox more than 5 years ago        Pain Inventory Average Pain 0 Pain Right Now 0 My pain is no pain  In the last 24 hours, has pain interfered with the following? General activity 0 Relation with others 0 Enjoyment of life 0 What TIME of day is your pain at its worst? no pain Sleep (in general) Good  Pain is worse with: no pain Pain improves with: no pain Relief from Meds: no pain  Mobility walk with assistance use a walker use a wheelchair transfers alone  Function retired  Neuro/Psych weakness tremor trouble walking  Prior Studies Any changes since last visit?  no  Physicians involved in your care Any changes since last visit?  no   Family History  Problem Relation Age of Onset   Stroke Father    Heart attack Father    Stroke Mother    Diabetes Brother    Hyperlipidemia Brother    Hypertension Brother    Hyperlipidemia Sister    Hypertension Sister    Social History   Socioeconomic History   Marital status: Divorced    Spouse name: Not on file   Number of children: 4   Years of education: HS   Highest education level: Not on file  Occupational History    Employer: RETIRED  Social Designer, fashion/clothing strain: Not on file   Food insecurity    Worry: Not on file    Inability: Not on file   Transportation needs    Medical: Not on file    Non-medical: Not on file  Tobacco Use    Smoking status: Former Smoker    Years: 5.00    Types: Cigarettes    Quit date: 11/14/2007    Years since quitting: 11.8   Smokeless tobacco: Never Used  Substance and Sexual Activity   Alcohol use: No   Drug use: No   Sexual activity: Not on file  Lifestyle   Physical activity    Days per week: Not on file    Minutes per session: Not on file   Stress: Not on file  Relationships   Social connections    Talks on phone: Not on file    Gets together: Not on file    Attends religious service: Not on file    Active member of club or organization: Not on file    Attends meetings of clubs or organizations: Not on file    Relationship status: Not on file  Other Topics Concern   Not on file  Social History Narrative   Patient lives at home with his caregiver/ex-wife.   Caffeine- 2 cups of coffee daily   Past Surgical History:  Procedure Laterality Date   CARDIAC CATHETERIZATION  11/17/2002   significant left main disease and 3 vessel CAD, mildly depressed LV systolic  fx, 60% left renal artery stenosis   CAROTID ENDARTERECTOMY  12/1997   [facility])   Bilateral ICA   CAROTID STENT INSERTION Bilateral dr berry and dr Kristine Royal--  (In-stent Restenosis)  LeftICA   02-12-2011;  RightICA   02-26-2011   CORONARY ARTERY BYPASS GRAFT  2/ 2004   dr hendrickson   x3 LIMA to LAD,free right internal mammary artery to obtuse marginal 1,SVG to distal right coronary.   CYSTOSCOPY W/ RETROGRADES Bilateral 09/10/2015   Procedure: CYSTOSCOPY WITH RETROGRADE PYELOGRAM;  Surgeon: Carolan Clines, MD;  Location: [facility];  Service: Urology;  Laterality: Bilateral;   CYSTOSCOPY WITH BIOPSY N/A 09/10/2015   Procedure: CYSTOSCOPY WITH BIOPSY,RIGHT TRIGONAL TUMOR 1.5 CM, EXCISIONAL BIOPSY WITH TAUBER FORCEP RIGHT POSTERIOR BLADDER WALL 1 CM, EXCISIONAL BIOPSY SATELITE BLADDER WALL 1 CM;  Surgeon: Carolan Clines, MD;  Location: [facility];  Service:  Urology;  Laterality: N/A;   CYSTOSCOPY WITH BIOPSY N/A 03/26/2017   Procedure: CYSTOSCOPY WITH BIOPSY/;  Surgeon: Carolan Clines, MD;  [facility];  Service: Urology;  Laterality: N/A;   CYSTOSCOPY WITH HYDRODISTENSION AND BIOPSY N/A 10/29/2015   Procedure: REPEAT CYSTOSCOPY BIOPSY BLADDER DEEP MUSCLES BLADDER BIOPSY;  Surgeon: Carolan Clines, MD;  Location: [place];  Service: Urology;  Laterality: N/A;   CYSTOSCOPY WITH URETHRAL DILATATION N/A 09/10/2015   Procedure: CYSTOSCOPY WITH URETHRAL DILATATION;  Surgeon: Carolan Clines, MD;  Location: [place];  Service: Urology;  Laterality: N/A;   FULGURATION OF BLADDER TUMOR N/A 03/26/2017   Procedure: FULGURATION OF BLADDER TUMOR;  Surgeon: Carolan Clines, MD;  [facility];  Service: Urology;  Laterality: N/A;   LAPAROSCOPIC CHOLECYSTECTOMY  1998   MOHS SURGERY  06-20-2015   side of nose   NM MYOCAR PERF WALL MOTION  01/09/2011   dr berry   mild to mod. perfusion defect in the basal inferolateral & mid inferolaterl regions consistant with an infarct/scar, no signigicant ischemia demonstracted/  Low Risk scan, no sig. change from previous study/  normal LV function and wall motion , ef 63%   ORIF RIGHT ANKLE FX  1998   hardware retained   TRANSURETHRAL RESECTION OF BLADDER TUMOR N/A 09/10/2015   Procedure: CYSTO TRANSURETHRAL RESECTION OF BLADDER TUMOR (TURBT) OF LEFT POSTERIOR BLADDER TUMOR 2 CM;  Surgeon: Carolan Clines, MD;  [facility];  Service: Urology;  Laterality: N/A;   TRANSURETHRAL RESECTION OF BLADDER TUMOR Left 04/17/2016   Procedure: TRANSURETHRAL RESECTION OF BLADDER TUMOR (TURBT);  Surgeon: Carolan Clines, MD;  [facility];  Service: Urology;  Laterality: Left;   US ECHOCARDIOGRAPHY  01/03/2011   normal LVF, ef>55%/  mild LAE/ trace MR and TR,  mild AV sclerosis without stenosis   Past Medical History:  Diagnosis Date   At  high risk for falls    Bladder cancer [facility])    urologist-  dr Gaynelle Arabian   CAD (coronary artery disease) cardiologist-  dr berry   Cerebrovascular arteriosclerosis    Gait disturbance, post-stroke    uses cane, rollator, and w/c long distance   Hemiparkinsonism [facility]) neurologist-- dr Leta Baptist   right side body   History of basal cell carcinoma excision    Sept 2016--  MOH's surgery side of nose   History of carotid artery stenosis cardiologist-- dr berry   bilateral --  1999 s/p  bilateral ICA endarterectomy and recurrent restenosis 2012  s/p  staged bilateral stenting   per last duplex 2015  stents widely patent   History of CVA with residual deficit neurologist-  dr Leta Baptist   3/ 1999  Right MCA  and  12/ 2004  Anterior division of  Right MCA ---  residual left spactic hemiparesis and left foot drop   History of MI (myocardial infarction)    02/ 2004   s/p  cabg    HOH (hard of hearing)    LEFT EAR  POST cva   Hyperextension deformity of left knee    wears brace   Hyperlipidemia    Hypertension    Left foot drop    residual from CVA  -- wears brace   Left spastic hemiparesis (HCC)    residual CVA 1999   Pancreatic pseudocyst    Peripheral arterial disease ([place])    S/P CABG x 4    02/ 2004   Seizure disorder ([place]) last seizure 2011 due to confusion   neurologist-  dr Leta Baptist-- per note seizure documented 2008 breakthrough partial complex seizure (prior tonic-clonic seizure's post CVA)   Type 2 diabetes, diet controlled ([place])     2   Visual neglect    LEFT EYE   BP (!) 150/69    Pulse (!) 58    Temp (!) 97.5 F (36.4 C)    Ht 5\' 4"  (1.626 m)    Wt 151 lb (68.5 kg)    SpO2 97%    BMI 25.92 kg/m   Opioid Risk Score:   Fall Risk Score:  `1  Depression screen PHQ 2/9  No flowsheet data found.  Review of Systems  Constitutional: Positive for unexpected weight change.  HENT: Negative.   Eyes: Negative.   Respiratory: Negative.   Cardiovascular:  Negative.   Endocrine: Negative.        High blood sugar  Genitourinary: Negative.   Musculoskeletal: Positive for gait problem.  Allergic/Immunologic: Negative.   Neurological: Positive for tremors and weakness.  Hematological: Bruises/bleeds easily.  All other systems reviewed and are negative.      Objective:   Physical Exam      Gen: no distress, normal appearing, in w/c HEENT: oral mucosa pink and moist, NCAT Cardio: Reg rate Chest: normal effort, normal rate of breathing Abd: soft, non-distended Ext: no edema Skin: intact Neuro: spastic left hemiparesis. Left elbow -20 degrees extension, tone 2/4 elbow, trace at wrist and fingers. LUE strength 1/5.  Musculoskeletal: left elbow contracture.  Psych: pleasant, normal affect   ASSESSMENT:  1. Right cerebrovascular accident, spastic left hemiparesis.  2. Seizure disorder.  3. Parkinson's disease 4. Bladder cancer   PLAN:  1. Discussed his left hand/wrist movements at length. Think we would do more harm than good if we increased medications. Asked him to focus on stretching his left elbow and to continue with gait attempts. If symptoms worsen, we could consider botox or med adjustment.  2. Continue with Dilantin and keppra per neuro recs. 3. Dantrium will be continued for spasticity management.Liver function tests have being followed by primary team. 4. Bladder cancer mgt per urology.     Fifteen minutes of face to face patient care time were spent during this visit. All questions were encouraged and answered.  Follow up with me in one year .

## 2019-09-21 NOTE — Patient Instructions (Signed)
PLEASE FEEL FREE TO CALL OUR OFFICE WITH ANY PROBLEMS OR QUESTIONS ([phone])      

## 2019-10-12 ENCOUNTER — Other Ambulatory Visit: Payer: Self-pay

## 2019-10-12 ENCOUNTER — Other Ambulatory Visit: Payer: Self-pay | Admitting: Cardiovascular Disease

## 2019-10-12 NOTE — Telephone Encounter (Signed)
Rx(s) sent to pharmacy electronically.  

## 2019-10-17 ENCOUNTER — Other Ambulatory Visit: Payer: Self-pay | Admitting: Cardiology

## 2019-10-17 NOTE — Telephone Encounter (Signed)
His discharge summary has him at hydralazine 25 mg BID. He was to have labs drawn by urologist for kidney function and if worsened, I planned to decrease or stop Benicar and increase hydralazine to TID to compensate. I do not have his recent labs from urology to make decision on this.  I would continue the BID hydralazine unless this is changed by his urologist.

## 2019-10-17 NOTE — Telephone Encounter (Signed)
Leave message for pt to call back 

## 2019-10-18 MED ORDER — HYDRALAZINE HCL 25 MG PO TABS: 25.0000 mg | ORAL_TABLET | Freq: Three times a day (TID) | ORAL | 1 refills | Status: DC

## 2019-11-02 ENCOUNTER — Ambulatory Visit: Payer: Medicare Other | Admitting: Cardiology

## 2019-11-02 ENCOUNTER — Other Ambulatory Visit: Payer: Self-pay

## 2019-11-02 VITALS — BP 115/62 | HR 63 | Ht 64.0 in | Wt 156.0 lb

## 2019-11-02 DIAGNOSIS — G20 Parkinson's disease: Secondary | ICD-10-CM

## 2019-11-02 DIAGNOSIS — I5033 Acute on chronic diastolic (congestive) heart failure: Secondary | ICD-10-CM

## 2019-11-02 DIAGNOSIS — Z951 Presence of aortocoronary bypass graft: Secondary | ICD-10-CM

## 2019-11-02 DIAGNOSIS — E785 Hyperlipidemia, unspecified: Secondary | ICD-10-CM

## 2019-11-02 DIAGNOSIS — I10 Essential (primary) hypertension: Secondary | ICD-10-CM | POA: Diagnosis not present

## 2019-11-02 DIAGNOSIS — N183 Chronic kidney disease, stage 3 unspecified: Secondary | ICD-10-CM | POA: Diagnosis not present

## 2019-11-02 MED ORDER — FUROSEMIDE 20 MG PO TABS: 20.0000 mg | ORAL_TABLET | Freq: Every day | ORAL | 3 refills | Status: DC

## 2019-11-02 NOTE — Progress Notes (Signed)
Cardiology Office Note:    Date:  11/02/2019   ID:  [name], DOB [birth year], MRN [id number]  PCP:  [name], MD  Cardiologist:  Quay Burow, MD  Electrophysiologist:  None   Referring MD: [name], MD   No chief complaint on file.   History of Present Illness:    [name] is a 74 y.o. male with a hx of coronary disease, status post coronary artery bypass grafting in 2004.  He had a low risk Myoview in 2012.  Echocardiogram in November 2020 showed his ejection fraction to be 60 to 65%.  He has vascular disease and had bilateral carotid endarterectomy in 1999 and bilateral carotid artery stenting in 2012.  Other medical issues include chronic renal insufficiency stage III, insulin-dependent diabetes, Parkinson's, and history of bladder cancer.    The patient was hospitalized in November 2020 with a blood tox abscess.  He also had diastolic heart failure that admission.  Fortunately he is recovered completely from that standpoint.  He was seen in the office 09/05/2019 as a follow-up.  He was told to return to the clinic in 3 months and is here today for that follow-up.    Since he was seen in November he has done well.  He denies any unusual shortness of breath or edema.  He is fairly debilitated from his Parkinson's.  His wife accompanied him as usual.  She had several questions concerning his diagnosis of congestive heart failure.  I reviewed this with her and explained his congestive heart failure was most likely diastolic and does not appear to be recurrent.  She was also concerned because one of the physicians in the hospital told her he had a history of atrial fibrillation although I could not find a history of this and she does not remember being told that in the past.    He has follow-up carotid Dopplers scheduled for next week but she would like to reschedule this down the road.  She is concerned about bringing him out too often in public during the [place]  pandemic.  After reviewing his carotid Dopplers from last year I felt like this was not unreasonable and I have rescheduled these for June with a follow-up with Dr. Gwenlyn [name] after that.  Past Medical History:  Diagnosis Date  . At high risk for falls   . Bladder cancer [facility])    urologist-  dr [name]  . CAD (coronary artery disease) cardiologist-  dr berry  . Cerebrovascular arteriosclerosis   . Gait disturbance, post-stroke    uses cane, rollator, and w/c long distance  . Hemiparkinsonism [facility]) neurologist-- dr Leta Baptist   right side body  . History of basal cell carcinoma excision    Sept 2016--  MOH's surgery side of nose  . History of carotid artery stenosis cardiologist-- dr berry   bilateral --  1999 s/p  bilateral ICA endarterectomy and recurrent restenosis 2012  s/p  staged bilateral stenting   per last duplex 2015  stents widely patent  . History of CVA with residual deficit neurologist-  dr Leta Baptist   3/ 1999  Right MCA  and  12/ 2004  Anterior division of  Right MCA ---  residual left spactic hemiparesis and left foot drop  . History of MI (myocardial infarction)    02/ 2004   s/p  cabg   . HOH (hard of hearing)    LEFT EAR  POST cva  . Hyperextension deformity of left knee  wears brace  . Hyperlipidemia   . Hypertension   . Left foot drop    residual from CVA  -- wears brace  . Left spastic hemiparesis ([place])    residual CVA 1999  . Pancreatic pseudocyst   . Peripheral arterial disease ([place])   . S/P CABG x 4    02/ 2004  . Seizure disorder [facility]) last seizure 2011 due to confusion   neurologist-  dr Leta Baptist-- per note seizure documented 2008 breakthrough partial complex seizure (prior tonic-clonic seizure's post CVA)  . Type 2 diabetes, diet controlled ([place])     2  . Visual neglect    LEFT EYE    Past Surgical History:  Procedure Laterality Date  . CARDIAC CATHETERIZATION  11/17/2002   significant left main disease and 3 vessel CAD, mildly depressed LV  systolic  fx, 123456 left renal artery stenosis  . CAROTID ENDARTERECTOMY  12/1997   [name])   Bilateral ICA  . CAROTID STENT INSERTION Bilateral dr berry and dr Kristine Royal--  (In-stent Restenosis)  LeftICA   02-12-2011;  [place]   02-26-2011  . CORONARY ARTERY BYPASS GRAFT  2/ 2004   dr hendrickson   x3 LIMA to LAD,free right internal mammary artery to obtuse marginal 1,SVG to distal right coronary.  . CYSTOSCOPY W/ RETROGRADES Bilateral 09/10/2015   Procedure: CYSTOSCOPY WITH RETROGRADE PYELOGRAM;  Surgeon: Carolan Clines, MD;  Location: [facility];  Service: Urology;  Laterality: Bilateral;  . CYSTOSCOPY WITH BIOPSY N/A 09/10/2015   Procedure: CYSTOSCOPY WITH BIOPSY,RIGHT TRIGONAL TUMOR 1.5 CM, EXCISIONAL BIOPSY WITH TAUBER FORCEP RIGHT POSTERIOR BLADDER WALL 1 CM, EXCISIONAL BIOPSY SATELITE BLADDER WALL 1 CM;  Surgeon: Carolan Clines, MD;  Location[facility];  Service: Urology;  Laterality: N/A;  . CYSTOSCOPY WITH BIOPSY N/A 03/26/2017   Procedure: CYSTOSCOPY WITH BIOPSY/;  Surgeon: Carolan Clines, MD;  [facility];  Service: Urology;  Laterality: N/A;  . CYSTOSCOPY WITH HYDRODISTENSION AND BIOPSY N/A 10/29/2015   Procedure: REPEAT CYSTOSCOPY BIOPSY BLADDER DEEP MUSCLES BLADDER BIOPSY;  Surgeon: Carolan Clines, MD;  Location: [place];  Service: Urology;  Laterality: N/A;  . CYSTOSCOPY WITH URETHRAL DILATATION N/A 09/10/2015   Procedure: CYSTOSCOPY WITH URETHRAL DILATATION;  Surgeon: Carolan Clines, MD;  Location: [place];  Service: Urology;  Laterality: N/A;  . FULGURATION OF BLADDER TUMOR N/A 03/26/2017   Procedure: FULGURATION OF BLADDER TUMOR;  Surgeon: Carolan Clines, MD;  [facility];  Service: Urology;  Laterality: N/A;  . [place]  . MOHS SURGERY  06-20-2015   side of nose  . NM MYOCAR PERF WALL MOTION  01/09/2011   dr berry   mild to mod. perfusion  defect in the basal inferolateral & mid inferolaterl regions consistant with an infarct/scar, no signigicant ischemia demonstracted/  Low Risk scan, no sig. change from previous study/  normal LV function and wall motion , ef 63%  . ORIF RIGHT ANKLE FX  1998   hardware retained  . TRANSURETHRAL RESECTION OF BLADDER TUMOR N/A 09/10/2015   Procedure: CYSTO TRANSURETHRAL RESECTION OF BLADDER TUMOR (TURBT) OF LEFT POSTERIOR BLADDER TUMOR 2 CM;  Surgeon: Carolan Clines, MD;  Location: [facility];  Service: Urology;  Laterality: N/A;  . TRANSURETHRAL RESECTION OF BLADDER TUMOR Left 04/17/2016   Procedure: TRANSURETHRAL RESECTION OF BLADDER TUMOR (TURBT);  Surgeon: Carolan Clines, MD;  [facility];  Service: Urology;  Laterality: Left;  . US ECHOCARDIOGRAPHY  01/03/2011  normal LVF, ef>55%/  mild LAE/ trace MR and TR,  mild AV sclerosis without stenosis    Current Medications: Current Meds  Medication Sig  . amLODipine (NORVASC) 10 MG tablet TAKE ONE TABLET BY MOUTH DAILY  . carbidopa-levodopa (SINEMET IR) 25-100 MG tablet Two tablets in the am, 1 tablet at lunch and 1 tablet in pm.  . cholecalciferol (VITAMIN D) 1000 units tablet Take 2,000 Units by mouth daily with breakfast.   . Coenzyme Q10 (COQ-10) 50 MG CAPS Take by mouth 2 (two) times daily.   . dantrolene (DANTRIUM) 50 MG capsule Take 1 capsule (50 mg total) by mouth 2 (two) times daily.  Marland Kitchen dipyridamole-aspirin (AGGRENOX) 200-25 MG 12hr capsule TAKE ONE CAPSULE BY MOUTH TWICE A DAY  . ezetimibe (ZETIA) 10 MG tablet TAKE ONE TABLET BY MOUTH DAILY  . fenofibrate (TRICOR) 48 MG tablet TAKE ONE TABLET BY MOUTH DAILY  . Flaxseed, Linseed, 1000 MG CAPS Take 1,000 mg by mouth every evening.   Marland Kitchen FOLBIC 2.5-25-2 MG TABS tablet Take 1 tablet by mouth once daily with food  . hydrALAZINE (APRESOLINE) 25 MG tablet Take 1 tablet (25 mg total) by mouth 3 (three) times daily.  . insulin lispro (HUMALOG KWIKPEN) 100 UNIT/ML  KiwkPen Inject 4 Units into the skin See admin instructions. Takes 3 times daily before meals for sugar over 200 only  . KEPPRA 500 MG tablet TAKE ONE TABLET BY MOUTH TWICE A DAY  . metoprolol tartrate (LOPRESSOR) 25 MG tablet TAKE ONE TABLET BY MOUTH DAILY  . nitroGLYCERIN (NITROSTAT) 0.4 MG SL tablet Place 1 tablet (0.4 mg total) under the tongue every 5 (five) minutes as needed for chest pain.  Marland Kitchen olmesartan (BENICAR) 20 MG tablet Take 1 tablet (20 mg total) by mouth daily.  Glory Rosebush DELICA LANCETS [id number] MISC   . ONETOUCH VERIO test strip   . RAPAFLO 8 MG CAPS capsule Take 8 mg by mouth daily with breakfast.   . rosuvastatin (CRESTOR) 20 MG tablet Take 1 tablet (20 mg total) by mouth daily.  Nelva Nay SOLOSTAR 300 UNIT/ML SOPN   . [DISCONTINUED] furosemide (LASIX) 20 MG tablet Take 1 tablet (20 mg total) by mouth daily.     Allergies:   Altace [ramipril]   Social History   Socioeconomic History  . Marital status: Divorced    Spouse name: Not on file  . Number of children: 4  . Years of education: HS  . Highest education level: Not on file  Occupational History    Employer: RETIRED  Tobacco Use  . Smoking status: Former Smoker    Years: 5.00    Types: Cigarettes    Quit date: 11/14/2007    Years since quitting: 11.9  . Smokeless tobacco: Never Used  Substance and Sexual Activity  . Alcohol use: No  . Drug use: No  . Sexual activity: Not on file  Other Topics Concern  . Not on file  Social History Narrative   Patient lives at home with his caregiver/ex-wife.   Caffeine- 2 cups of coffee daily   Social Determinants of Health   Financial Resource Strain:   . Difficulty of Paying Living Expenses: Not on file  Food Insecurity:   . Worried About Charity fundraiser in the Last Year: Not on file  . Ran Out of Food in the Last Year: Not on file  Transportation Needs:   . Lack of Transportation (Medical): Not on file  . Lack of Transportation (Non-Medical): Not on  file   Physical Activity:   . Days of Exercise per Week: Not on file  . Minutes of Exercise per Session: Not on file  Stress:   . Feeling of Stress : Not on file  Social Connections:   . Frequency of Communication with Friends and Family: Not on file  . Frequency of Social Gatherings with Friends and Family: Not on file  . Attends Religious Services: Not on file  . Active Member of Clubs or Organizations: Not on file  . Attends Archivist Meetings: Not on file  . Marital Status: Not on file     Family History: The patient's family history includes Diabetes in his brother; Heart attack in his father; Hyperlipidemia in his brother and sister; Hypertension in his brother and sister; Stroke in his father and mother.  ROS:   Please see the history of present illness.     All other systems reviewed and are negative.  EKGs/Labs/Other Studies Reviewed:    The following studies were reviewed today: Nov 2020 Echo  EKG:  EKG is not ordered today.  The ekg ordered today demonstrates 08/30/2019 NSR- HR 80.  Recent Labs: 08/27/2019: ALT 10; B Natriuretic Peptide 381.5 08/28/2019: BUN 35; Creatinine, Ser 1.72; Hemoglobin 10.6; Magnesium 1.9; Platelets 297; Potassium 4.4; Sodium 138  Recent Lipid Panel    Component Value Date/Time   CHOL 137 10/08/2017 0820   TRIG 149 10/08/2017 0820   HDL 40 10/08/2017 0820   CHOLHDL 3.4 10/08/2017 0820   CHOLHDL 9.1 (H) 10/22/2016 0821   VLDL NOT CALC 10/22/2016 0821   LDLCALC 67 10/08/2017 0820   LDLDIRECT 78 10/22/2016 0821    Physical Exam:    VS:  BP 115/62   Pulse 63   Ht 5\' 4"  (1.626 m)   Wt 156 lb (70.8 kg)   BMI 26.78 kg/m     Wt Readings from Last 3 Encounters:  11/02/19 156 lb (70.8 kg)  09/21/19 151 lb (68.5 kg)  09/05/19 150 lb (68 kg)     GEN:  Thin male in a wheel chair, well developed in no acute distress HEENT: Normal NECK: No JVD; bilateral carotid bruits CARDIAC: RRR, no murmurs, rubs, gallops RESPIRATORY:   Clear to auscultation without rales, wheezing or rhonchi  ABDOMEN: Soft, non-tender, non-distended MUSCULOSKELETAL:  No edema; No deformity  SKIN: Warm and dry NEUROLOGIC:  Alert and oriented x 3 PSYCHIATRIC:  Normal affect   ASSESSMENT:    Diastolic CHF- This diagnosis was given to him during his hospitalization in Nov 2020. He apparently had volume overload that responded to Lasix.  Echo showed normal LVF, moderate LVH, and grade 1 DD.  He has not had recurrent CHF.  Essential hypertension Controlled- moderate LVH on echo  Parkinson's disease ([place]) Followed by Neurology. Pt is significantly disabled- in a wheel chair  History of stroke Pt has a history of remote Rt brain stroke with left sided paralysis.   Hx of CABG CABG 2004, Myoview low risk 2012 and echo showed normal LVF Nov 2020  CRI (chronic renal insufficiency), stage 3 (moderate) (HCC) SCr worse when we tried to increase his Benicar dose  Hyperlipidemia Trocor, Zetia, Crestor 20 mg  Carotid stenosis Known bilateral carotid disease, s/p bilateral CEA in 1999, followed by staged bilateral CA stenting 2012   PLAN:    Push carotid dopplers back to June- f/u with Dr [name] after that.    Medication Adjustments/Labs and Tests Ordered: Current medicines are reviewed at length with the  patient today.  Concerns regarding medicines are outlined above.  No orders of the defined types were placed in this encounter.  No orders of the defined types were placed in this encounter.   There are no Patient Instructions on file for this visit.   Signed, Kerin Ransom, PA-C  11/02/2019 2:24 PM    [facility]

## 2019-11-02 NOTE — Patient Instructions (Signed)
Medication Instructions:  Your physician recommends that you continue on your current medications as directed. Please refer to the Current Medication list given to you today.   *If you need a refill on your cardiac medications before your next appointment, please call your pharmacy*  Lab Work: [place]   If you have labs (blood work) drawn today and your tests are completely normal, you will receive your results only by: Marland Kitchen MyChart Message (if you have MyChart) OR . A paper copy in the mail If you have any lab test that is abnormal or we need to change your treatment, we will call you to review the results.  Testing/Procedures:  RESCHEDULED CAROTID TO  JUNE   Follow-Up: At [facility], you and your health needs are our priority.  As part of our continuing mission to provide you with exceptional heart care, we have created designated Provider Care Teams.  These Care Teams include your primary Cardiologist (physician) and Advanced Practice Providers (APPs -  Physician Assistants and Nurse Practitioners) who all work together to provide you with the care you need, when you need it.  Your next appointment:    1  WEEK  AFTER CAROTID    The format for your next appointment:   In Person  Provider:    You may see Quay Burow, MD     Other Instructions

## 2019-11-04 ENCOUNTER — Ambulatory Visit: Payer: Medicare Other | Admitting: Cardiology

## 2019-11-04 ENCOUNTER — Encounter (HOSPITAL_COMMUNITY): Payer: Medicare Other

## 2019-11-11 ENCOUNTER — Other Ambulatory Visit: Payer: Self-pay | Admitting: Diagnostic Neuroimaging

## 2019-12-13 ENCOUNTER — Other Ambulatory Visit: Payer: Self-pay | Admitting: Cardiovascular Disease

## 2019-12-17 ENCOUNTER — Other Ambulatory Visit: Payer: Self-pay | Admitting: Cardiovascular Disease

## 2020-01-16 ENCOUNTER — Telehealth: Payer: Self-pay | Admitting: *Deleted

## 2020-01-16 MED ORDER — CARBIDOPA-LEVODOPA 25-100 MG PO TABS: 2.0000 | ORAL_TABLET | Freq: Three times a day (TID) | ORAL | 3 refills | Status: DC

## 2020-01-16 NOTE — Telephone Encounter (Signed)
Received fax from Kristopher Oppenheim re: carb/levo RX needs to be changed. [employer], caregiver who stated that patient has been on Carb/levo two tabs three x a day for a while. She had tried to decrease amount per Dr Leta Baptist but noted he got worse. So she titrated it back up to previous dose. I advised will send in new Rx. She verbalized understanding, appreciation.

## 2020-01-22 ENCOUNTER — Other Ambulatory Visit: Payer: Self-pay | Admitting: Cardiovascular Disease

## 2020-03-05 ENCOUNTER — Other Ambulatory Visit: Payer: Self-pay | Admitting: Cardiovascular Disease

## 2020-03-05 NOTE — Telephone Encounter (Signed)
Rx request sent to pharmacy.  

## 2020-03-14 ENCOUNTER — Telehealth: Payer: Self-pay | Admitting: Diagnostic Neuroimaging

## 2020-03-14 NOTE — Telephone Encounter (Signed)
Rep with [facility] called to speak with RN in regards to medical clearance

## 2020-03-14 NOTE — Telephone Encounter (Signed)
Returned call to [employer], LVM.

## 2020-03-15 ENCOUNTER — Encounter: Payer: Self-pay | Admitting: *Deleted

## 2020-03-15 NOTE — Telephone Encounter (Signed)
Surgical clearance letter signed, faxed back to [facility].

## 2020-03-15 NOTE — Telephone Encounter (Addendum)
Received fax from [facility]: Aggrenox/surgical clearance for prostate biopsy. Anti platelet letter on MD desk for signature.

## 2020-03-21 ENCOUNTER — Other Ambulatory Visit: Payer: Self-pay | Admitting: Diagnostic Neuroimaging

## 2020-03-23 ENCOUNTER — Other Ambulatory Visit (HOSPITAL_COMMUNITY): Payer: Self-pay | Admitting: Family Medicine

## 2020-03-23 DIAGNOSIS — I6523 Occlusion and stenosis of bilateral carotid arteries: Secondary | ICD-10-CM

## 2020-03-27 ENCOUNTER — Telehealth: Payer: Self-pay | Admitting: Cardiovascular Disease

## 2020-03-27 NOTE — Telephone Encounter (Signed)
Patient's Ex-Wife called. The patient was recently diagnosed with Prostate Cancer. The patient is not interested in doing the Doppler at this time because the patient has a catheter. They are planning on keeping the appointment with Dr. Gwenlyn Found but at this time it is too hard to get him in and out of the car for other appointments. Please call Kennyth Lose, the patient's ex-wife and caregiver if Dr. Gwenlyn Found has any issues with their decision

## 2020-03-27 NOTE — Telephone Encounter (Signed)
noted 

## 2020-03-27 NOTE — Telephone Encounter (Signed)
That's fine

## 2020-04-03 ENCOUNTER — Inpatient Hospital Stay (HOSPITAL_COMMUNITY): Admission: RE | Admit: 2020-04-03 | Payer: Medicare Other | Source: Ambulatory Visit

## 2020-04-03 NOTE — Telephone Encounter (Signed)
Patient's ex-wife is calling back to follow up in regards to whether or not appointment scheduled for 04/06/20 is still needed. Please call.

## 2020-04-04 NOTE — Telephone Encounter (Signed)
I can just see him back in October

## 2020-04-05 NOTE — Telephone Encounter (Signed)
Ex-wife updated and verbalized understanding. Appointment scheduled for 07/17/20 at 10:45 am.

## 2020-04-06 ENCOUNTER — Ambulatory Visit: Payer: Medicare Other | Admitting: Cardiovascular Disease

## 2020-04-21 ENCOUNTER — Other Ambulatory Visit: Payer: Self-pay | Admitting: Cardiovascular Disease

## 2020-04-26 ENCOUNTER — Other Ambulatory Visit: Payer: Self-pay | Admitting: Cardiovascular Disease

## 2020-05-01 ENCOUNTER — Ambulatory Visit: Payer: Self-pay | Admitting: Diagnostic Neuroimaging

## 2020-05-24 ENCOUNTER — Other Ambulatory Visit: Payer: Self-pay | Admitting: Diagnostic Neuroimaging

## 2020-05-24 NOTE — Telephone Encounter (Signed)
Refilled folbic x 3 months with note to pharmacy: must schedule FU for further refills.

## 2020-05-29 ENCOUNTER — Other Ambulatory Visit: Payer: Self-pay | Admitting: Urology

## 2020-05-29 DIAGNOSIS — C61 Malignant neoplasm of prostate: Secondary | ICD-10-CM

## 2020-06-01 ENCOUNTER — Ambulatory Visit: Payer: Medicare Other | Admitting: Radiation Oncology

## 2020-06-01 ENCOUNTER — Ambulatory Visit: Payer: Medicare Other

## 2020-06-06 ENCOUNTER — Encounter (HOSPITAL_COMMUNITY)
Admission: RE | Admit: 2020-06-06 | Discharge: 2020-06-06 | Disposition: A | Discharge status: Home / Self Care | Payer: Medicare Other | Source: Ambulatory Visit | Attending: Urology | Admitting: Urology

## 2020-06-06 ENCOUNTER — Other Ambulatory Visit: Payer: Self-pay

## 2020-06-06 DIAGNOSIS — C61 Malignant neoplasm of prostate: Secondary | ICD-10-CM | POA: Diagnosis not present

## 2020-06-06 MED ORDER — TECHNETIUM TC 99M MEDRONATE IV KIT
21.6000 | PACK | Freq: Once | INTRAVENOUS | Status: AC
  Administered 2020-06-06: 21.6 via INTRAVENOUS

## 2020-06-07 ENCOUNTER — Other Ambulatory Visit: Payer: Self-pay | Admitting: Cardiovascular Disease

## 2020-06-11 ENCOUNTER — Emergency Department (HOSPITAL_COMMUNITY): Payer: Medicare Other

## 2020-06-11 ENCOUNTER — Encounter (HOSPITAL_COMMUNITY): Payer: Self-pay

## 2020-06-11 ENCOUNTER — Inpatient Hospital Stay (HOSPITAL_COMMUNITY): Payer: Medicare Other

## 2020-06-11 ENCOUNTER — Other Ambulatory Visit: Payer: Self-pay

## 2020-06-11 ENCOUNTER — Inpatient Hospital Stay (HOSPITAL_COMMUNITY)
Admission: EM | Admit: 2020-06-11 | Discharge: 2020-06-18 | DRG: 871 | Disposition: A | Discharge status: Home / Self Care | Payer: Medicare Other | Attending: Internal Medicine | Admitting: Internal Medicine

## 2020-06-11 DIAGNOSIS — E871 Hypo-osmolality and hyponatremia: Secondary | ICD-10-CM | POA: Diagnosis present

## 2020-06-11 DIAGNOSIS — H9190 Unspecified hearing loss, unspecified ear: Secondary | ICD-10-CM | POA: Diagnosis present

## 2020-06-11 DIAGNOSIS — G40909 Epilepsy, unspecified, not intractable, without status epilepticus: Secondary | ICD-10-CM

## 2020-06-11 DIAGNOSIS — N1831 Chronic kidney disease, stage 3a: Secondary | ICD-10-CM | POA: Diagnosis present

## 2020-06-11 DIAGNOSIS — K611 Rectal abscess: Secondary | ICD-10-CM

## 2020-06-11 DIAGNOSIS — L89320 Pressure ulcer of left buttock, unstageable: Secondary | ICD-10-CM | POA: Diagnosis present

## 2020-06-11 DIAGNOSIS — Z951 Presence of aortocoronary bypass graft: Secondary | ICD-10-CM

## 2020-06-11 DIAGNOSIS — Z452 Encounter for adjustment and management of vascular access device: Secondary | ICD-10-CM

## 2020-06-11 DIAGNOSIS — Z85828 Personal history of other malignant neoplasm of skin: Secondary | ICD-10-CM

## 2020-06-11 DIAGNOSIS — Z515 Encounter for palliative care: Secondary | ICD-10-CM | POA: Diagnosis not present

## 2020-06-11 DIAGNOSIS — Z20822 Contact with and (suspected) exposure to covid-19: Secondary | ICD-10-CM | POA: Diagnosis present

## 2020-06-11 DIAGNOSIS — N170 Acute kidney failure with tubular necrosis: Secondary | ICD-10-CM | POA: Diagnosis present

## 2020-06-11 DIAGNOSIS — A419 Sepsis, unspecified organism: Principal | ICD-10-CM

## 2020-06-11 DIAGNOSIS — C7951 Secondary malignant neoplasm of bone: Secondary | ICD-10-CM | POA: Diagnosis present

## 2020-06-11 DIAGNOSIS — E0810 Diabetes mellitus due to underlying condition with ketoacidosis without coma: Secondary | ICD-10-CM | POA: Diagnosis not present

## 2020-06-11 DIAGNOSIS — I959 Hypotension, unspecified: Secondary | ICD-10-CM

## 2020-06-11 DIAGNOSIS — Z8673 Personal history of transient ischemic attack (TIA), and cerebral infarction without residual deficits: Secondary | ICD-10-CM

## 2020-06-11 DIAGNOSIS — Z79899 Other long term (current) drug therapy: Secondary | ICD-10-CM

## 2020-06-11 DIAGNOSIS — I2510 Atherosclerotic heart disease of native coronary artery without angina pectoris: Secondary | ICD-10-CM | POA: Diagnosis present

## 2020-06-11 DIAGNOSIS — I130 Hypertensive heart and chronic kidney disease with heart failure and stage 1 through stage 4 chronic kidney disease, or unspecified chronic kidney disease: Secondary | ICD-10-CM | POA: Diagnosis present

## 2020-06-11 DIAGNOSIS — I5033 Acute on chronic diastolic (congestive) heart failure: Secondary | ICD-10-CM

## 2020-06-11 DIAGNOSIS — I252 Old myocardial infarction: Secondary | ICD-10-CM

## 2020-06-11 DIAGNOSIS — I69354 Hemiplegia and hemiparesis following cerebral infarction affecting left non-dominant side: Secondary | ICD-10-CM | POA: Diagnosis not present

## 2020-06-11 DIAGNOSIS — R531 Weakness: Secondary | ICD-10-CM | POA: Diagnosis not present

## 2020-06-11 DIAGNOSIS — Z7401 Bed confinement status: Secondary | ICD-10-CM

## 2020-06-11 DIAGNOSIS — F17210 Nicotine dependence, cigarettes, uncomplicated: Secondary | ICD-10-CM | POA: Diagnosis present

## 2020-06-11 DIAGNOSIS — N179 Acute kidney failure, unspecified: Secondary | ICD-10-CM | POA: Diagnosis not present

## 2020-06-11 DIAGNOSIS — C61 Malignant neoplasm of prostate: Secondary | ICD-10-CM

## 2020-06-11 DIAGNOSIS — Z833 Family history of diabetes mellitus: Secondary | ICD-10-CM

## 2020-06-11 DIAGNOSIS — Z823 Family history of stroke: Secondary | ICD-10-CM

## 2020-06-11 DIAGNOSIS — G20 Parkinson's disease: Secondary | ICD-10-CM | POA: Diagnosis present

## 2020-06-11 DIAGNOSIS — Z8781 Personal history of (healed) traumatic fracture: Secondary | ICD-10-CM

## 2020-06-11 DIAGNOSIS — E1110 Type 2 diabetes mellitus with ketoacidosis without coma: Secondary | ICD-10-CM | POA: Diagnosis present

## 2020-06-11 DIAGNOSIS — L02215 Cutaneous abscess of perineum: Secondary | ICD-10-CM | POA: Diagnosis not present

## 2020-06-11 DIAGNOSIS — E785 Hyperlipidemia, unspecified: Secondary | ICD-10-CM | POA: Diagnosis present

## 2020-06-11 DIAGNOSIS — L0231 Cutaneous abscess of buttock: Secondary | ICD-10-CM | POA: Diagnosis present

## 2020-06-11 DIAGNOSIS — Z83438 Family history of other disorder of lipoprotein metabolism and other lipidemia: Secondary | ICD-10-CM

## 2020-06-11 DIAGNOSIS — D631 Anemia in chronic kidney disease: Secondary | ICD-10-CM | POA: Diagnosis present

## 2020-06-11 DIAGNOSIS — Z8546 Personal history of malignant neoplasm of prostate: Secondary | ICD-10-CM

## 2020-06-11 DIAGNOSIS — Z8249 Family history of ischemic heart disease and other diseases of the circulatory system: Secondary | ICD-10-CM

## 2020-06-11 DIAGNOSIS — I361 Nonrheumatic tricuspid (valve) insufficiency: Secondary | ICD-10-CM | POA: Diagnosis not present

## 2020-06-11 DIAGNOSIS — K651 Peritoneal abscess: Secondary | ICD-10-CM | POA: Diagnosis present

## 2020-06-11 DIAGNOSIS — I340 Nonrheumatic mitral (valve) insufficiency: Secondary | ICD-10-CM | POA: Diagnosis not present

## 2020-06-11 DIAGNOSIS — E1122 Type 2 diabetes mellitus with diabetic chronic kidney disease: Secondary | ICD-10-CM | POA: Diagnosis present

## 2020-06-11 DIAGNOSIS — L89890 Pressure ulcer of other site, unstageable: Secondary | ICD-10-CM | POA: Diagnosis present

## 2020-06-11 DIAGNOSIS — E119 Type 2 diabetes mellitus without complications: Secondary | ICD-10-CM | POA: Diagnosis not present

## 2020-06-11 DIAGNOSIS — L89300 Pressure ulcer of unspecified buttock, unstageable: Secondary | ICD-10-CM | POA: Diagnosis not present

## 2020-06-11 DIAGNOSIS — R6521 Severe sepsis with septic shock: Secondary | ICD-10-CM | POA: Diagnosis present

## 2020-06-11 DIAGNOSIS — D649 Anemia, unspecified: Secondary | ICD-10-CM | POA: Diagnosis present

## 2020-06-11 DIAGNOSIS — Z7189 Other specified counseling: Secondary | ICD-10-CM | POA: Diagnosis not present

## 2020-06-11 DIAGNOSIS — I10 Essential (primary) hypertension: Secondary | ICD-10-CM | POA: Diagnosis present

## 2020-06-11 LAB — CBC WITH DIFFERENTIAL/PLATELET
Abs Immature Granulocytes: 0.53 10*3/uL — ABNORMAL HIGH (ref 0.00–0.07)
Basophils Absolute: 0 10*3/uL (ref 0.0–0.1)
Basophils Relative: 0 %
Eosinophils Absolute: 0 10*3/uL (ref 0.0–0.5)
Eosinophils Relative: 0 %
HCT: 22.2 % — ABNORMAL LOW (ref 39.0–52.0)
Hemoglobin: 7.5 g/dL — ABNORMAL LOW (ref 13.0–17.0)
Immature Granulocytes: 2 %
Lymphocytes Relative: 2 %
Lymphs Abs: 0.4 10*3/uL — ABNORMAL LOW (ref 0.7–4.0)
MCH: 32.1 pg (ref 26.0–34.0)
MCHC: 33.8 g/dL (ref 30.0–36.0)
MCV: 94.9 fL (ref 80.0–100.0)
Monocytes Absolute: 1.5 10*3/uL — ABNORMAL HIGH (ref 0.1–1.0)
Monocytes Relative: 6 %
Neutro Abs: 23.1 10*3/uL — ABNORMAL HIGH (ref 1.7–7.7)
Neutrophils Relative %: 90 %
Platelets: 251 10*3/uL (ref 150–400)
RBC: 2.34 MIL/uL — ABNORMAL LOW (ref 4.22–5.81)
RDW: 12.7 % (ref 11.5–15.5)
WBC: 25.6 10*3/uL — ABNORMAL HIGH (ref 4.0–10.5)
nRBC: 0 % (ref 0.0–0.2)

## 2020-06-11 LAB — URINALYSIS, ROUTINE W REFLEX MICROSCOPIC
Bacteria, UA: NONE SEEN
Bilirubin Urine: NEGATIVE
Glucose, UA: 150 mg/dL — AB
Hgb urine dipstick: NEGATIVE
Ketones, ur: 5 mg/dL — AB
Nitrite: NEGATIVE
Protein, ur: NEGATIVE mg/dL
Specific Gravity, Urine: 1.014 (ref 1.005–1.030)
pH: 5 (ref 5.0–8.0)

## 2020-06-11 LAB — SODIUM, URINE, RANDOM: Sodium, Ur: <10 mmol/L

## 2020-06-11 LAB — RENAL FUNCTION PANEL
Albumin: 2.4 g/dL — ABNORMAL LOW (ref 3.5–5.0)
Anion gap: 22 — ABNORMAL HIGH (ref 5–15)
BUN: 120 mg/dL — ABNORMAL HIGH (ref 8–23)
CO2: 11 mmol/L — ABNORMAL LOW (ref 22–32)
Calcium: 8.2 mg/dL — ABNORMAL LOW (ref 8.9–10.3)
Chloride: 80 mmol/L — ABNORMAL LOW (ref 98–111)
Creatinine, Ser: 5.26 mg/dL — ABNORMAL HIGH (ref 0.61–1.24)
GFR calc Af Amer: 11 mL/min — ABNORMAL LOW (ref >60)
GFR calc non Af Amer: 10 mL/min — ABNORMAL LOW (ref >60)
Glucose, Bld: 440 mg/dL — ABNORMAL HIGH (ref 70–99)
Phosphorus: 6.4 mg/dL — ABNORMAL HIGH (ref 2.5–4.6)
Potassium: 5.2 mmol/L — ABNORMAL HIGH (ref 3.5–5.1)
Sodium: 113 mmol/L — CL (ref 135–145)

## 2020-06-11 LAB — COMPREHENSIVE METABOLIC PANEL
ALT: 7 U/L (ref 0–44)
AST: 18 U/L (ref 15–41)
Albumin: 2.8 g/dL — ABNORMAL LOW (ref 3.5–5.0)
Alkaline Phosphatase: 91 U/L (ref 38–126)
Anion gap: 17 — ABNORMAL HIGH (ref 5–15)
BUN: 125 mg/dL — ABNORMAL HIGH (ref 8–23)
CO2: 13 mmol/L — ABNORMAL LOW (ref 22–32)
Calcium: 8.1 mg/dL — ABNORMAL LOW (ref 8.9–10.3)
Chloride: 81 mmol/L — ABNORMAL LOW (ref 98–111)
Creatinine, Ser: 5.02 mg/dL — ABNORMAL HIGH (ref 0.61–1.24)
GFR calc Af Amer: 12 mL/min — ABNORMAL LOW (ref >60)
GFR calc non Af Amer: 10 mL/min — ABNORMAL LOW (ref >60)
Glucose, Bld: 470 mg/dL — ABNORMAL HIGH (ref 70–99)
Potassium: 5.2 mmol/L — ABNORMAL HIGH (ref 3.5–5.1)
Sodium: 111 mmol/L — CL (ref 135–145)
Total Bilirubin: 0.9 mg/dL (ref 0.3–1.2)
Total Protein: 6.2 g/dL — ABNORMAL LOW (ref 6.5–8.1)

## 2020-06-11 LAB — BLOOD GAS, VENOUS
Acid-base deficit: 14.9 mmol/L — ABNORMAL HIGH (ref 0.0–2.0)
Bicarbonate: 11 mmol/L — ABNORMAL LOW (ref 20.0–28.0)
O2 Saturation: 87.3 %
Patient temperature: 98.6
pCO2, Ven: 26.4 mmHg — ABNORMAL LOW (ref 44.0–60.0)
pH, Ven: 7.242 — ABNORMAL LOW (ref 7.250–7.430)
pO2, Ven: 63.9 mmHg — ABNORMAL HIGH (ref 32.0–45.0)

## 2020-06-11 LAB — CBG MONITORING, ED
Glucose-Capillary: 427 mg/dL — ABNORMAL HIGH (ref 70–99)
Glucose-Capillary: 445 mg/dL — ABNORMAL HIGH (ref 70–99)
Glucose-Capillary: 390 mg/dL — ABNORMAL HIGH (ref 70–99)
Glucose-Capillary: 374 mg/dL — ABNORMAL HIGH (ref 70–99)
Glucose-Capillary: 347 mg/dL — ABNORMAL HIGH (ref 70–99)

## 2020-06-11 LAB — PROTIME-INR
INR: 1.1 (ref 0.8–1.2)
Prothrombin Time: 14.2 seconds (ref 11.4–15.2)

## 2020-06-11 LAB — LACTIC ACID, PLASMA: Lactic Acid, Venous: 1.3 mmol/L (ref 0.5–1.9)

## 2020-06-11 LAB — SARS CORONAVIRUS 2 BY RT PCR (HOSPITAL ORDER, PERFORMED IN ~~LOC~~ HOSPITAL LAB): SARS Coronavirus 2: NEGATIVE

## 2020-06-11 LAB — BETA-HYDROXYBUTYRIC ACID: Beta-Hydroxybutyric Acid: 4.79 mmol/L — ABNORMAL HIGH (ref 0.05–0.27)

## 2020-06-11 LAB — BRAIN NATRIURETIC PEPTIDE: B Natriuretic Peptide: 1002.8 pg/mL — ABNORMAL HIGH (ref 0.0–100.0)

## 2020-06-11 LAB — ABO/RH: ABO/RH(D): B POS

## 2020-06-11 LAB — CREATININE, URINE, RANDOM: Creatinine, Urine: 85.49 mg/dL

## 2020-06-11 MED ORDER — VANCOMYCIN HCL 1500 MG/300ML IV SOLN
1500.0000 mg | Freq: Once | INTRAVENOUS | Status: AC
  Administered 2020-06-11: 1500 mg via INTRAVENOUS
  Filled 2020-06-11: qty 300

## 2020-06-11 MED ORDER — INSULIN REGULAR(HUMAN) IN NACL 100-0.9 UT/100ML-% IV SOLN
INTRAVENOUS | Status: DC
  Administered 2020-06-11: 8.5 [IU]/h via INTRAVENOUS
  Administered 2020-06-12: 9 [IU]/h via INTRAVENOUS
  Filled 2020-06-11 (×2): qty 100

## 2020-06-11 MED ORDER — SODIUM CHLORIDE 0.9 % IV BOLUS
500.0000 mL | Freq: Once | INTRAVENOUS | Status: AC
  Administered 2020-06-11: 500 mL via INTRAVENOUS

## 2020-06-11 MED ORDER — NOREPINEPHRINE 4 MG/250ML-% IV SOLN
0.0000 ug/min | INTRAVENOUS | Status: DC
  Administered 2020-06-11: 2 ug/min via INTRAVENOUS
  Administered 2020-06-12: 35 ug/min via INTRAVENOUS
  Administered 2020-06-12: 9 ug/min via INTRAVENOUS
  Filled 2020-06-11 (×2): qty 250

## 2020-06-11 MED ORDER — LACTATED RINGERS IV SOLN: INTRAVENOUS | Status: DC

## 2020-06-11 MED ORDER — NOREPINEPHRINE 4 MG/250ML-% IV SOLN
INTRAVENOUS | Status: AC
  Filled 2020-06-11: qty 250

## 2020-06-11 MED ORDER — VANCOMYCIN VARIABLE DOSE PER UNSTABLE RENAL FUNCTION (PHARMACIST DOSING): Status: DC

## 2020-06-11 MED ORDER — ALBUMIN HUMAN 25 % IV SOLN
25.0000 g | Freq: Once | INTRAVENOUS | Status: AC
  Administered 2020-06-11: 25 g via INTRAVENOUS
  Filled 2020-06-11: qty 100

## 2020-06-11 MED ORDER — METRONIDAZOLE IN NACL 5-0.79 MG/ML-% IV SOLN
500.0000 mg | Freq: Three times a day (TID) | INTRAVENOUS | Status: DC
  Administered 2020-06-11 – 2020-06-13 (×5): 500 mg via INTRAVENOUS
  Filled 2020-06-11 (×5): qty 100

## 2020-06-11 MED ORDER — SODIUM CHLORIDE 0.9 % IV SOLN
250.0000 mL | INTRAVENOUS | Status: DC
  Administered 2020-06-12: 250 mL via INTRAVENOUS

## 2020-06-11 MED ORDER — DEXTROSE IN LACTATED RINGERS 5 % IV SOLN: INTRAVENOUS | Status: DC

## 2020-06-11 MED ORDER — DEXTROSE 50 % IV SOLN: 0.0000 mL | INTRAVENOUS | Status: DC | PRN

## 2020-06-11 MED ORDER — MORPHINE SULFATE (PF) 4 MG/ML IV SOLN
4.0000 mg | Freq: Once | INTRAVENOUS | Status: AC
  Administered 2020-06-11: 4 mg via INTRAVENOUS
  Filled 2020-06-11: qty 1

## 2020-06-11 MED ORDER — POLYETHYLENE GLYCOL 3350 17 G PO PACK: 17.0000 g | PACK | Freq: Every day | ORAL | Status: DC | PRN

## 2020-06-11 MED ORDER — CARBIDOPA-LEVODOPA 25-100 MG PO TABS
2.0000 | ORAL_TABLET | Freq: Three times a day (TID) | ORAL | Status: DC
  Administered 2020-06-11 – 2020-06-18 (×20): 2 via ORAL
  Filled 2020-06-11 (×21): qty 2

## 2020-06-11 MED ORDER — SODIUM CHLORIDE 0.9 % IV SOLN: INTRAVENOUS | Status: DC

## 2020-06-11 MED ORDER — DOCUSATE SODIUM 100 MG PO CAPS: 100.0000 mg | ORAL_CAPSULE | Freq: Two times a day (BID) | ORAL | Status: DC | PRN

## 2020-06-11 MED ORDER — HEPARIN SODIUM (PORCINE) 5000 UNIT/ML IJ SOLN
5000.0000 [IU] | Freq: Three times a day (TID) | INTRAMUSCULAR | Status: DC
  Administered 2020-06-11 – 2020-06-12 (×2): 5000 [IU] via SUBCUTANEOUS
  Filled 2020-06-11 (×2): qty 1

## 2020-06-11 MED ORDER — SODIUM CHLORIDE 0.9 % IV SOLN
1.0000 g | INTRAVENOUS | Status: DC
  Administered 2020-06-12 – 2020-06-14 (×3): 1 g via INTRAVENOUS
  Filled 2020-06-11 (×6): qty 1

## 2020-06-11 MED ORDER — LEVETIRACETAM ER 500 MG PO TB24
500.0000 mg | ORAL_TABLET | Freq: Every day | ORAL | Status: DC
  Administered 2020-06-11 – 2020-06-18 (×8): 500 mg via ORAL
  Filled 2020-06-11 (×9): qty 1

## 2020-06-11 MED ORDER — DANTROLENE SODIUM 25 MG PO CAPS
50.0000 mg | ORAL_CAPSULE | Freq: Two times a day (BID) | ORAL | Status: DC
  Administered 2020-06-11 – 2020-06-18 (×14): 50 mg via ORAL
  Filled 2020-06-11 (×16): qty 2

## 2020-06-11 MED ORDER — LACTATED RINGERS IV BOLUS
500.0000 mL | Freq: Once | INTRAVENOUS | Status: AC
  Administered 2020-06-11: 500 mL via INTRAVENOUS

## 2020-06-11 MED ORDER — ALBUMIN HUMAN 5 % IV SOLN
25.0000 g | Freq: Once | INTRAVENOUS | Status: DC
  Filled 2020-06-11: qty 500

## 2020-06-11 MED ORDER — ASPIRIN-DIPYRIDAMOLE ER 25-200 MG PO CP12
1.0000 | ORAL_CAPSULE | Freq: Two times a day (BID) | ORAL | Status: DC
  Administered 2020-06-11 – 2020-06-18 (×14): 1 via ORAL
  Filled 2020-06-11 (×16): qty 1

## 2020-06-11 MED ORDER — PIPERACILLIN-TAZOBACTAM 3.375 G IVPB 30 MIN
3.3750 g | Freq: Once | INTRAVENOUS | Status: AC
  Administered 2020-06-11: 3.375 g via INTRAVENOUS
  Filled 2020-06-11: qty 50

## 2020-06-11 NOTE — Progress Notes (Signed)
Pharmacy Antibiotic Note  [name] is a 74 y.o. male admitted on 06/11/2020 with septic shock. Pharmacy has been consulted for vancomycin + cefepime dosing.  Pt has PMH significant for recently diagnosed prostate cancer w/mets to the bone. Pt has chronic indwelling Foley, AKI on admission - not a candidate for HD at this time. Pt has perirectal abscess, recently prescribed cephalexin PTA.  Today, 06/11/20  WBC 25.6  SCr 5.26, CrCl ~10 mL/min. Baseline SCr ~1.7-1.8  T 97.5 F  Lactate 1.3  Plan:  Cefepime 1 g IV q24h  Metronidazole 500 mg IV q8h  Vancomycin 1500 mg IV LD. Will follow renal function to determine further dosing  Goal VT 15-20 mcg/mL  Height: 5\' 4"  (162.6 cm) Weight: 66.7 kg (147 lb) IBW/kg (Calculated) : 59.2  Temp (24hrs), Avg:97.6 F (36.4 C), Min:97.5 F (36.4 C), Max:97.7 F (36.5 C)  Recent Labs  Lab 06/11/20 1346 06/11/20 1434 06/11/20 1816  WBC  --  25.6*  --   CREATININE  --  5.02* 5.26*  LATICACIDVEN 1.3  --   --     Estimated Creatinine Clearance: 10 mL/min (A) (by C-G formula based on SCr of 5.26 mg/dL (H)).    Allergies  Allergen Reactions  . Altace [Ramipril] Cough    Antimicrobials this admission: vancomycin 8/30 >>  cefepime 8/30 >>  Metronidazole 8/30 >> Pip/tazo x1 in ED 8/30  Dose adjustments this admission:  Microbiology results: 8/30 BCx:  8/30 UCx:    Thank you for allowing pharmacy to be a part of this patient's care.  Lenis Noon, PharmD 06/11/2020 7:43 PM

## 2020-06-11 NOTE — ED Triage Notes (Addendum)
Pt arrives today with caretaker/ex wife who provides most of the history.Pt has hx of prostate cancer that has metastasized to bones.  Pt has had a perianal abscess x 1 week. Pt has been on keflex. Pt states that abscess is now draining. Per caregiver, skin has become necrotic around the wound, approximately pin size.  Pt has also lost ability to walk, as of 1 week ago (Monday). Pt's sons have been carrying pt around. Pt also states very painful to sit, is on cushion. Pt's sugars have also been running 300+ per caregiver. Pt has not had an appetite at all.  Caregiver was asking about surgical interventions and palliative care for pt.

## 2020-06-11 NOTE — H&P (Signed)
NAME:  [name], MRN:  [id number], DOB:  05-27-1946, LOS: 0 ADMISSION DATE:  06/11/2020, CONSULTATION DATE:  06/11/2020 REFERRING MD:  Jaquita Rector doc, CHIEF COMPLAINT:  Sepsis, AKI   Brief History   74 year old gentleman with perirectal abscess Not been feeling well for a whole week Wound started draining a couple of days ago-cottage cheese Fevers up to 101.6 at home Blood sugar over 300  History of present illness   Metastatic prostate cancer with metastasis to the bone-new diagnosis that was just found recently Chronic indwelling Foley Past Medical History   Past Medical History:  Diagnosis Date  . At high risk for falls   . Bladder cancer [facility])    urologist-  dr Gaynelle Arabian  . CAD (coronary artery disease) cardiologist-  dr berry  . Cerebrovascular arteriosclerosis   . Gait disturbance, post-stroke    uses cane, rollator, and w/c long distance  . Hemiparkinsonism [facility]) neurologist-- dr Leta Baptist   right side body  . History of basal cell carcinoma excision    Sept 2016--  MOH's surgery side of nose  . History of carotid artery stenosis cardiologist-- dr berry   bilateral --  1999 s/p  bilateral ICA endarterectomy and recurrent restenosis 2012  s/p  staged bilateral stenting   per last duplex 2015  stents widely patent  . History of CVA with residual deficit neurologist-  dr Leta Baptist   3/ 1999  Right MCA  and  12/ 2004  Anterior division of  Right MCA ---  residual left spactic hemiparesis and left foot drop  . History of MI (myocardial infarction)    02/ 2004   s/p  cabg   . HOH (hard of hearing)    LEFT EAR  POST cva  . Hyperextension deformity of left knee    wears brace  . Hyperlipidemia   . Hypertension   . Left foot drop    residual from CVA  -- wears brace  . Left spastic hemiparesis ([place])    residual CVA 1999  . Pancreatic pseudocyst   . Peripheral arterial disease ([place])   . S/P CABG x 4    02/ 2004  . Seizure disorder [facility]) last seizure 2011 due to confusion    neurologist-  dr Leta Baptist-- per note seizure documented 2008 breakthrough partial complex seizure (prior tonic-clonic seizure's post CVA)  . Type 2 diabetes, diet controlled ([place])     2  . Visual neglect    LEFT EYE   Significant Hospital Events   Low blood pressure  Consults:  Renal pccm  Procedures:  None  Significant Diagnostic Tests:   chest x-ray 8/30-no acute infiltrate  Micro Data:  Blood cultures x2 Urine cultures   Antimicrobials:  Zosyn 8/30 Vancomycin 8/30  Interim history/subjective:  Chronically ill-appearing Hypotensive  Objective   Blood pressure 90/78, pulse 68, temperature 97.7 F (36.5 C), temperature source Oral, resp. rate 15, height 5\' 4"  (1.626 m), weight 66.7 kg, SpO2 90 %.        Intake/Output Summary (Last 24 hours) at 06/11/2020 1843 Last data filed at 06/11/2020 1735 Gross per 24 hour  Intake 600 ml  Output --  Net 600 ml   Filed Weights   06/11/20 1512  Weight: 66.7 kg    Examination: General: Chronically ill-appearing gentleman, very frail HENT: Dry oral mucosa Lungs: Clear bilaterally Cardiovascular: S1-S2 appreciated Abdomen: Bowel sounds appreciated Extremities: No clubbing, no edema Neuro: Alert and oriented x3 GU: Poor output  Resolved Hospital Problem list  Assessment & Plan:  Septic shock -Fluid resuscitation -Antibiotics vancomycin and cefepime -Follow cultures -Pressors to maintain MAP greater than 65 -Monitor CVP  DKA -Endotool started -Keep n.p.o. -Obtain CT abdomen  Acute kidney injury -Multifactorial -Likely due to prerenal factors and also ATN  Discussed with Dr. Royce Macadamia of nephrology  Hyponatremia -Corrected to 117 -Continue to monitor closely  Metastatic prostate cancer -Recent evaluation showed significant metastases to bone -This is very advanced disease  Buttock abscess -Was recently on antibiotics that he stopped 2 days ago -He is septic at present and this may be the source  of infection  Anemia -Anemia of chronic disease -No evidence of blood loss  Congestive heart failure -Echo with diastolic dysfunction in November 2020 -BNP over 1000  History of CVA -Residual weakness on the left side  Parkinson's disease  History of CABG  Seizure disorder  We will reassess home medications  Multiple comorbidities will make him a poor dialysis candidate  Best practice:  Diet: N.p.o. Pain/Anxiety/Delirium protocol (if indicated): We will monitor VAP protocol (if indicated): Not indicated DVT prophylaxis: Heparin GI prophylaxis: We will reassess Glucose control: Endo tool Mobility: Bedrest Code Status: Full code Family Communication: Ex-wife at bedside Disposition: ICU  Labs   CBC: Recent Labs  Lab 06/11/20 1434  WBC 25.6*  NEUTROABS 23.1*  HGB 7.5*  HCT 22.2*  MCV 94.9  PLT 606    Basic Metabolic Panel: Recent Labs  Lab 06/11/20 1434  NA 111*  K 5.2*  CL 81*  CO2 13*  GLUCOSE 470*  BUN 125*  CREATININE 5.02*  CALCIUM 8.1*   GFR: Estimated Creatinine Clearance: 10.5 mL/min (A) (by C-G formula based on SCr of 5.02 mg/dL (H)). Recent Labs  Lab 06/11/20 1346 06/11/20 1434  WBC  --  25.6*  LATICACIDVEN 1.3  --     Liver Function Tests: Recent Labs  Lab 06/11/20 1434  AST 18  ALT 7  ALKPHOS 91  BILITOT 0.9  PROT 6.2*  ALBUMIN 2.8*   No results for input(s): LIPASE, AMYLASE in the last 168 hours. No results for input(s): AMMONIA in the last 168 hours.  ABG    Component Value Date/Time   PHART 7.378 08/27/2019 0057   PCO2ART 35.8 08/27/2019 0057   PO2ART 56.1 (L) 08/27/2019 0057   HCO3 20.6 08/27/2019 0057   TCO2 25 10/29/2015 0749   ACIDBASEDEF 3.7 (H) 08/27/2019 0057   O2SAT 88.2 08/27/2019 0057     Coagulation Profile: Recent Labs  Lab 06/11/20 1434  INR 1.1    Cardiac Enzymes: No results for input(s): CKTOTAL, CKMB, CKMBINDEX, TROPONINI in the last 168 hours.  HbA1C: Hgb A1c MFr Bld  Date/Time  Value Ref Range Status  08/27/2019 02:57 AM 7.5 (H) 4.8 - 5.6 % Final    Comment:    (NOTE) Pre diabetes:          5.7%-6.4% Diabetes:              >6.4% Glycemic control for   <7.0% adults with diabetes   03/23/2017 12:17 PM 6.4 (H) 4.8 - 5.6 % Final    Comment:    (NOTE)         Pre-diabetes: 5.7 - 6.4         Diabetes: >6.4         Glycemic control for adults with diabetes: <7.0     CBG: Recent Labs  Lab 06/11/20 1340 06/11/20 1747  GLUCAP 427* 445*    Review of Systems:  Very frail  Past Medical History  He,  has a past medical history of At high risk for falls, Bladder cancer ([place]), CAD (coronary artery disease) (cardiologist-  dr berry), Cerebrovascular arteriosclerosis, Gait disturbance, post-stroke, Hemiparkinsonism ([place]) (neurologist-- dr Leta Baptist), History of basal cell carcinoma excision, History of carotid artery stenosis (cardiologist-- dr berry), History of CVA with residual deficit (neurologist-  dr Leta Baptist), History of MI (myocardial infarction), HOH (hard of hearing), Hyperextension deformity of left knee, Hyperlipidemia, Hypertension, Left foot drop, Left spastic hemiparesis ([place]), Pancreatic pseudocyst, Peripheral arterial disease ([place]), S/P CABG x 4, Seizure disorder ([place]) (last seizure 2011 due to confusion), Type 2 diabetes, diet controlled ([place]), and Visual neglect.   Surgical History    Past Surgical History:  Procedure Laterality Date  . CARDIAC CATHETERIZATION  11/17/2002   significant left main disease and 3 vessel CAD, mildly depressed LV systolic  fx, 56% left renal artery stenosis  . CAROTID ENDARTERECTOMY  12/1997   [facility])   Bilateral ICA  . CAROTID STENT INSERTION Bilateral dr berry and dr Kristine Royal--  (In-stent Restenosis)  LeftICA   02-12-2011;  [place]   02-26-2011  . CORONARY ARTERY BYPASS GRAFT  2/ 2004   dr hendrickson   x3 LIMA to LAD,free right internal mammary artery to obtuse marginal 1,SVG to distal right coronary.  .  CYSTOSCOPY W/ RETROGRADES Bilateral 09/10/2015   Procedure: CYSTOSCOPY WITH RETROGRADE PYELOGRAM;  Surgeon: Carolan Clines, MD;  [facility];  Service: Urology;  Laterality: Bilateral;  . CYSTOSCOPY WITH BIOPSY N/A 09/10/2015   Procedure: CYSTOSCOPY WITH BIOPSY,RIGHT TRIGONAL TUMOR 1.5 CM, EXCISIONAL BIOPSY WITH TAUBER FORCEP RIGHT POSTERIOR BLADDER WALL 1 CM, EXCISIONAL BIOPSY SATELITE BLADDER WALL 1 CM;  Surgeon: Carolan Clines, MD;  Location[facility];  Service: Urology;  Laterality: N/A;  . CYSTOSCOPY WITH BIOPSY N/A 03/26/2017   Procedure: CYSTOSCOPY WITH BIOPSY/;  Surgeon: Carolan Clines, MD;  Location: [facility];  Service: Urology;  Laterality: N/A;  . CYSTOSCOPY WITH HYDRODISTENSION AND BIOPSY N/A 10/29/2015   Procedure: REPEAT CYSTOSCOPY BIOPSY BLADDER DEEP MUSCLES BLADDER BIOPSY;  Surgeon: Carolan Clines, MD;  Location: [place];  Service: Urology;  Laterality: N/A;  . CYSTOSCOPY WITH URETHRAL DILATATION N/A 09/10/2015   Procedure: CYSTOSCOPY WITH URETHRAL DILATATION;  Surgeon: Carolan Clines, MD;  Location: [place];  Service: Urology;  Laterality: N/A;  . FULGURATION OF BLADDER TUMOR N/A 03/26/2017   Procedure: FULGURATION OF BLADDER TUMOR;  Surgeon: Carolan Clines, MD;  Location: [facility];  Service: Urology;  Laterality: N/A;  . [place]  . MOHS SURGERY  06-20-2015   side of nose  . NM MYOCAR PERF WALL MOTION  01/09/2011   dr berry   mild to mod. perfusion defect in the basal inferolateral & mid inferolaterl regions consistant with an infarct/scar, no signigicant ischemia demonstracted/  Low Risk scan, no sig. change from previous study/  normal LV function and wall motion , ef 63%  . ORIF RIGHT ANKLE FX  1998   hardware retained  . TRANSURETHRAL RESECTION OF BLADDER TUMOR N/A 09/10/2015   Procedure: CYSTO TRANSURETHRAL RESECTION OF BLADDER TUMOR (TURBT) OF LEFT  POSTERIOR BLADDER TUMOR 2 CM;  Surgeon: Carolan Clines, MD;  Location: [facility];  Service: Urology;  Laterality: N/A;  . TRANSURETHRAL RESECTION OF BLADDER TUMOR Left 04/17/2016   Procedure: TRANSURETHRAL RESECTION OF BLADDER TUMOR (TURBT);  Surgeon: Carolan Clines, MD;  Location: [facility];  Service: Urology;  Laterality:  Left;  . US ECHOCARDIOGRAPHY  01/03/2011   normal LVF, ef>55%/  mild LAE/ trace MR and TR,  mild AV sclerosis without stenosis     Social History   reports that he quit smoking about 12 years ago. His smoking use included cigarettes. He quit after 5.00 years of use. He has never used smokeless tobacco. He reports that he does not drink alcohol and does not use drugs.   Family History   His family history includes Diabetes in his brother; Heart attack in his father; Hyperlipidemia in his brother and sister; Hypertension in his brother and sister; Stroke in his father and mother.   Allergies Allergies  Allergen Reactions  . Altace [Ramipril] Cough     Home Medications  Prior to Admission medications   Medication Sig Start Date End Date Taking? Authorizing Provider  amLODipine (NORVASC) 10 MG tablet TAKE ONE TABLET BY MOUTH DAILY 10/17/19   Kerin Ransom K, PA-C  carbidopa-levodopa (SINEMET IR) 25-100 MG tablet Take 2 tablets by mouth 3 (three) times daily. Change: Take Two tabs 3 x daily 01/16/20   Penumalli, Earlean Polka, MD  cholecalciferol (VITAMIN D) 1000 units tablet Take 2,000 Units by mouth daily with breakfast.     [provider]  Coenzyme Q10 (COQ-10) 50 MG CAPS Take by mouth 2 (two) times daily.     [provider]  dantrolene (DANTRIUM) 50 MG capsule Take 1 capsule (50 mg total) by mouth 2 (two) times daily. 09/01/19   Meredith Staggers, MD  dipyridamole-aspirin (AGGRENOX) 200-25 MG 12hr capsule TAKE ONE CAPSULE BY MOUTH TWICE A DAY 03/21/20   Penumalli, Earlean Polka, MD  ezetimibe (ZETIA) 10 MG tablet TAKE ONE TABLET BY MOUTH DAILY  12/19/19   Lorretta Harp, MD  fenofibrate (TRICOR) 48 MG tablet TAKE ONE TABLET BY MOUTH DAILY 04/24/20   Lorretta Harp, MD  Flaxseed, Linseed, 1000 MG CAPS Take 1,000 mg by mouth every evening.     [provider]  FOLBIC 2.5-25-2 MG TABS tablet Take 1 tablet by mouth once daily with food 05/24/20   Lomax, Amy, NP  furosemide (LASIX) 20 MG tablet Take 1 tablet (20 mg total) by mouth daily. 11/02/19 12/02/19  Erlene Quan, PA-C  hydrALAZINE (APRESOLINE) 25 MG tablet TAKE ONE TABLET BY MOUTH THREE TIMES A DAY 06/08/20   Troy Sine, MD  insulin lispro (HUMALOG KWIKPEN) 100 UNIT/ML KiwkPen Inject 4 Units into the skin See admin instructions. Takes 3 times daily before meals for sugar over 200 only    [provider]  KEPPRA 500 MG tablet TAKE ONE TABLET BY MOUTH TWICE A DAY 07/27/19   Penumalli, Earlean Polka, MD  metoprolol tartrate (LOPRESSOR) 25 MG tablet TAKE ONE TABLET BY MOUTH DAILY 12/14/19   Lorretta Harp, MD  nitroGLYCERIN (NITROSTAT) 0.4 MG SL tablet Place 1 tablet (0.4 mg total) under the tongue every 5 (five) minutes as needed for chest pain. 10/19/17   Lorretta Harp, MD  olmesartan (BENICAR) 20 MG tablet TAKE ONE TABLET BY MOUTH DAILY 03/05/20   Lorretta Harp, MD  [facility] LANCETS 16X [place]  07/29/17   [provider]  [facility] VERIO test strip  08/16/17   [provider]  RAPAFLO 8 MG CAPS capsule Take 8 mg by mouth daily with breakfast.  04/22/17   [provider]  rosuvastatin (CRESTOR) 20 MG tablet TAKE ONE TABLET BY MOUTH DAILY 04/26/20   Lorretta Harp, MD  Obie Dredge  300 UNIT/ML SOPN  04/05/19   [provider]    The patient is critically ill with multiple organ systems failure and requires high complexity decision making for assessment and support, frequent evaluation and titration of therapies, application of advanced monitoring technologies and extensive interpretation of multiple databases. Critical Care  Time devoted to patient care services described in this note independent of APP/resident time (if applicable)  is 35 minutes.   Sherrilyn Rist MD [place] [facility] Personal pager: [phone] If unanswered, please page CCM On-call: [phone]

## 2020-06-11 NOTE — Consult Note (Addendum)
[facility] Renal Consultation Note  Requesting MD: Aletta Edouard, MD Indication for Consultation:  AKI and hyponatremia   Chief complaint: decreased PO intake, perirectal abscess   HPI: [name] is a 74 y.o. male with a history of bladder cancer and chronic Foley catheter (follows with Dr. Gaynelle Arabian), CKD stage III, CAD s/p CABG, HTN, Type 2 diabetes mellitus, and hx of CVA who presented to the hospital with not eating or drinking well "can't get anything down him" as well as perirectal abscess for one week for which he had been on keflex.  Some n/v as well.  He's also not been able to ambulate for the past week.  His ex wife is his caregiver and supplements his history.  She states that they saw urology today in clinic (Dr. Lovena Neighbours) and he recommended palliative care as well as presenting to the ER for any acute needs for the abscess.  They state that they were told that his cancer had spread per a PET scan he had done last week.  General surgery has been consulted and per their consult from Barkley Boards, PA no plans to take to the OR for now and they are planning to start hydrotherapy tomorrow.  He was noted to also have acute renal failure Cr 5, BUN 125 as well as hyponatremia (Na uncorrected 111 and corrected sodium of 117 with glu 595) and metabolic acidosis and marked hyperglycemia.  Nephrology is consulted for assistance with management of AKI.  Home medication include Lasix 20 mg daily and olmesartan 20 mg daily.  I searched outpatient [place] kidney records and was not able to locate a chart for him.  Note that he has a charted hx of prior seizure disorder.  He has had a foley catheter since 02/11/20.  They were told of CHF last year - has been on lasix 20 mg daily since November 2020.   Note covid screen was negative.  He has been initiated on insulin gtt.  CXR with lungs clear. ER also ordered NS 500 mL bolus.  They would like to talk with someone about palliative  care.  Creatinine  Date/Time Value Ref Range Status  03/04/2018 02:34 PM 1.27 0.70 - 1.30 mg/dL Final   Creat  Date/Time Value Ref Range Status  07/13/2013 08:14 AM 1.11 0.50 - 1.35 mg/dL Final   Creatinine, Ser  Date/Time Value Ref Range Status  06/11/2020 02:34 PM 5.02 (H) 0.61 - 1.24 mg/dL Final  08/28/2019 04:21 AM 1.72 (H) 0.61 - 1.24 mg/dL Final  08/27/2019 02:57 AM 1.80 (H) 0.61 - 1.24 mg/dL Final  08/26/2019 04:20 PM 1.90 (H) 0.61 - 1.24 mg/dL Final  08/18/2018 03:29 PM 1.66 (H) 0.76 - 1.27 mg/dL Final  03/23/2017 12:17 PM 1.13 0.61 - 1.24 mg/dL Final  04/10/2016 11:45 AM 1.15 0.61 - 1.24 mg/dL Final  10/29/2015 07:49 AM 1.10 0.61 - 1.24 mg/dL Final  09/10/2015 09:18 AM 1.20 0.61 - 1.24 mg/dL Final  05/23/2011 02:31 AM 0.80 0.50 - 1.35 mg/dL Final  05/22/2011 11:45 AM 0.90 0.50 - 1.35 mg/dL Final  02/27/2011 03:50 AM 0.97 0.40 - 1.50 mg/dL Final  02/26/2011 07:30 AM 1.10 0.40 - 1.50 mg/dL Final  02/13/2011 03:58 AM 0.98 0.40 - 1.50 mg/dL Final  02/12/2011 07:23 AM 1.2 0.40 - 1.50 mg/dL Final  01/22/2011 09:07 AM 1.0 0.40 - 1.50 mg/dL Final     PMHx:   Past Medical History:  Diagnosis Date  . At high risk for falls   . Bladder  cancer [facility])    urologist-  dr Gaynelle Arabian  . CAD (coronary artery disease) cardiologist-  dr berry  . Cerebrovascular arteriosclerosis   . Gait disturbance, post-stroke    uses cane, rollator, and w/c long distance  . [facility]) neurologist-- dr Leta Baptist   right side body  . History of basal cell carcinoma excision    Sept 2016--  [facility]'s surgery side of nose  . History of carotid artery stenosis cardiologist-- dr berry   bilateral --  1999 s/p  bilateral ICA endarterectomy and recurrent restenosis 2012  s/p  staged bilateral stenting   per last duplex 2015  stents widely patent  . History of CVA with residual deficit neurologist-  dr Leta Baptist   3/ 1999  Right MCA  and  12/ 2004  Anterior division of  Right MCA ---  residual  left spactic hemiparesis and left foot drop  . History of MI (myocardial infarction)    02/ 2004   s/p  cabg   . HOH (hard of hearing)    LEFT EAR  POST cva  . Hyperextension deformity of left knee    wears brace  . Hyperlipidemia   . Hypertension   . Left foot drop    residual from CVA  -- wears brace  . Left spastic hemiparesis ([place])    residual CVA 1999  . Pancreatic pseudocyst   . Peripheral arterial disease ([place])   . S/P CABG x 4    02/ 2004  . Seizure disorder [facility]) last seizure 2011 due to confusion   neurologist-  dr Leta Baptist-- per note seizure documented 2008 breakthrough partial complex seizure (prior tonic-clonic seizure's post CVA)  . Type 2 diabetes, diet controlled ([place])     2  . Visual neglect    LEFT EYE    Past Surgical History:  Procedure Laterality Date  . CARDIAC CATHETERIZATION  11/17/2002   significant left main disease and 3 vessel CAD, mildly depressed LV systolic  fx, 81% left renal artery stenosis  . CAROTID ENDARTERECTOMY  12/1997   [facility])   Bilateral ICA  . CAROTID STENT INSERTION Bilateral dr berry and dr Kristine Royal--  (In-stent Restenosis)  LeftICA   02-12-2011;  [place]   02-26-2011  . CORONARY ARTERY BYPASS GRAFT  2/ 2004   dr hendrickson   x3 LIMA to LAD,free right internal mammary artery to obtuse marginal 1,SVG to distal right coronary.  . CYSTOSCOPY W/ RETROGRADES Bilateral 09/10/2015   Procedure: CYSTOSCOPY WITH RETROGRADE PYELOGRAM;  Surgeon: Carolan Clines, MD;  Location: [facility];  Service: Urology;  Laterality: Bilateral;  . CYSTOSCOPY WITH BIOPSY N/A 09/10/2015   Procedure: CYSTOSCOPY WITH BIOPSY,RIGHT TRIGONAL TUMOR 1.5 CM, EXCISIONAL BIOPSY WITH TAUBER FORCEP RIGHT POSTERIOR BLADDER WALL 1 CM, EXCISIONAL BIOPSY SATELITE BLADDER WALL 1 CM;  Surgeon: Carolan Clines, MD;  Location: [facility];  Service: Urology;  Laterality: N/A;  . CYSTOSCOPY WITH BIOPSY N/A 03/26/2017   Procedure:  CYSTOSCOPY WITH BIOPSY/;  Surgeon: Carolan Clines, MD;  Location: [facility];  Service: Urology;  Laterality: N/A;  . CYSTOSCOPY WITH HYDRODISTENSION AND BIOPSY N/A 10/29/2015   Procedure: REPEAT CYSTOSCOPY BIOPSY BLADDER DEEP MUSCLES BLADDER BIOPSY;  Surgeon: Carolan Clines, MD;  Location: [place];  Service: Urology;  Laterality: N/A;  . CYSTOSCOPY WITH URETHRAL DILATATION N/A 09/10/2015   Procedure: CYSTOSCOPY WITH URETHRAL DILATATION;  Surgeon: Carolan Clines, MD;  Location: [place];  Service: Urology;  Laterality: N/A;  . FULGURATION  OF BLADDER TUMOR N/A 03/26/2017   Procedure: FULGURATION OF BLADDER TUMOR;  Surgeon: Carolan Clines, MD;  Location: [facility];  Service: Urology;  Laterality: N/A;  . [place]  . MOHS SURGERY  06-20-2015   side of nose  . NM MYOCAR PERF WALL MOTION  01/09/2011   dr berry   mild to mod. perfusion defect in the basal inferolateral & mid inferolaterl regions consistant with an infarct/scar, no signigicant ischemia demonstracted/  Low Risk scan, no sig. change from previous study/  normal LV function and wall motion , ef 63%  . ORIF RIGHT ANKLE FX  1998   hardware retained  . TRANSURETHRAL RESECTION OF BLADDER TUMOR N/A 09/10/2015   Procedure: CYSTO TRANSURETHRAL RESECTION OF BLADDER TUMOR (TURBT) OF LEFT POSTERIOR BLADDER TUMOR 2 CM;  Surgeon: Carolan Clines, MD;  Location[facility];  Service: Urology;  Laterality: N/A;  . TRANSURETHRAL RESECTION OF BLADDER TUMOR Left 04/17/2016   Procedure: TRANSURETHRAL RESECTION OF BLADDER TUMOR (TURBT);  Surgeon: Carolan Clines, MD;  Location: [facility];  Service: Urology;  Laterality: Left;  . US ECHOCARDIOGRAPHY  01/03/2011   normal LVF, ef>55%/  mild LAE/ trace MR and TR,  mild AV sclerosis without stenosis    Family Hx:  Family History  Problem Relation Age of Onset  . Stroke Father   . Heart attack Father   . Stroke Mother    . Diabetes Brother   . Hyperlipidemia Brother   . Hypertension Brother   . Hyperlipidemia Sister   . Hypertension Sister     Social History:  reports that he quit smoking about 12 years ago. His smoking use included cigarettes. He quit after 5.00 years of use. He has never used smokeless tobacco. He reports that he does not drink alcohol and does not use drugs.  Allergies:  Allergies  Allergen Reactions  . Altace [Ramipril] Cough    Medications: Prior to Admission medications   Medication Sig Start Date End Date Taking? Authorizing Provider  amLODipine (NORVASC) 10 MG tablet TAKE ONE TABLET BY MOUTH DAILY 10/17/19   Kerin Ransom K, PA-C  carbidopa-levodopa (SINEMET IR) 25-100 MG tablet Take 2 tablets by mouth 3 (three) times daily. Change: Take Two tabs 3 x daily 01/16/20   Penumalli, Earlean Polka, MD  cholecalciferol (VITAMIN D) 1000 units tablet Take 2,000 Units by mouth daily with breakfast.     [provider]  Coenzyme Q10 (COQ-10) 50 MG CAPS Take by mouth 2 (two) times daily.     [provider]  dantrolene (DANTRIUM) 50 MG capsule Take 1 capsule (50 mg total) by mouth 2 (two) times daily. 09/01/19   Meredith Staggers, MD  dipyridamole-aspirin (AGGRENOX) 200-25 MG 12hr capsule TAKE ONE CAPSULE BY MOUTH TWICE A DAY 03/21/20   Penumalli, Earlean Polka, MD  ezetimibe (ZETIA) 10 MG tablet TAKE ONE TABLET BY MOUTH DAILY 12/19/19   Lorretta Harp, MD  fenofibrate (TRICOR) 48 MG tablet TAKE ONE TABLET BY MOUTH DAILY 04/24/20   Lorretta Harp, MD  Flaxseed, Linseed, 1000 MG CAPS Take 1,000 mg by mouth every evening.     [provider]  FOLBIC 2.5-25-2 MG TABS tablet Take 1 tablet by mouth once daily with food 05/24/20   Lomax, Amy, NP  furosemide (LASIX) 20 MG tablet Take 1 tablet (20 mg total) by mouth daily. 11/02/19 12/02/19  Erlene Quan, PA-C  hydrALAZINE (APRESOLINE) 25 MG tablet TAKE ONE TABLET BY MOUTH THREE TIMES A DAY  06/08/20   Troy Sine, MD  insulin  lispro (HUMALOG KWIKPEN) 100 UNIT/ML KiwkPen Inject 4 Units into the skin See admin instructions. Takes 3 times daily before meals for sugar over 200 only    [provider]  KEPPRA 500 MG tablet TAKE ONE TABLET BY MOUTH TWICE A DAY 07/27/19   Penumalli, Earlean Polka, MD  metoprolol tartrate (LOPRESSOR) 25 MG tablet TAKE ONE TABLET BY MOUTH DAILY 12/14/19   Lorretta Harp, MD  nitroGLYCERIN (NITROSTAT) 0.4 MG SL tablet Place 1 tablet (0.4 mg total) under the tongue every 5 (five) minutes as needed for chest pain. 10/19/17   Lorretta Harp, MD  olmesartan (BENICAR) 20 MG tablet TAKE ONE TABLET BY MOUTH DAILY 03/05/20   Lorretta Harp, MD  [facility] DELICA LANCETS 16X [place]  07/29/17   [provider]  [facility] VERIO test strip  08/16/17   [provider]  RAPAFLO 8 MG CAPS capsule Take 8 mg by mouth daily with breakfast.  04/22/17   [provider]  rosuvastatin (CRESTOR) 20 MG tablet TAKE ONE TABLET BY MOUTH DAILY 04/26/20   Lorretta Harp, MD  TOUJEO SOLOSTAR 300 UNIT/ML [facility]  04/05/19   [provider]    I have reviewed the patient's current and reported prior to admission medications.  Labs:  BMP Latest Ref Rng & Units 06/11/2020 08/28/2019 08/27/2019  Glucose 70 - 99 mg/dL 470(H) 196(H) 297(H)  BUN 8 - 23 mg/dL 125(H) 35(H) 40(H)  Creatinine 0.61 - 1.24 mg/dL 5.02(H) 1.72(H) 1.80(H)  BUN/Creat Ratio 10 - 24 - - -  Sodium 135 - 145 mmol/L 111(LL) 138 131(L)  Potassium 3.5 - 5.1 mmol/L 5.2(H) 4.4 4.3  Chloride 98 - 111 mmol/L 81(L) 103 101  CO2 22 - 32 mmol/L 13(L) 21(L) 20(L)  Calcium 8.9 - 10.3 mg/dL 8.1(L) 8.7(L) 8.6(L)    Urinalysis    Component Value Date/Time   COLORURINE YELLOW 06/11/2020 1519   APPEARANCEUR HAZY (A) 06/11/2020 1519   LABSPEC 1.014 06/11/2020 1519   PHURINE 5.0 06/11/2020 1519   GLUCOSEU 150 (A) 06/11/2020 1519   HGBUR NEGATIVE 06/11/2020 1519   BILIRUBINUR NEGATIVE 06/11/2020 1519   KETONESUR 5 (A) 06/11/2020  1519   PROTEINUR NEGATIVE 06/11/2020 1519   UROBILINOGEN 0.2 05/22/2011 1129   NITRITE NEGATIVE 06/11/2020 1519   LEUKOCYTESUR MODERATE (A) 06/11/2020 1519     ROS:  Pertinent items noted in HPI and remainder of comprehensive ROS otherwise negative.    Physical Exam: Vitals:   06/11/20 1650 06/11/20 1700  BP: (!) 126/108 (!) 111/59  Pulse: 68 68  Resp: (!) 22 (!) 25  Temp:    SpO2: 94% 91%     General: Elderly male in stretcher ill-appearing HEENT: Normocephalic atraumatic Eyes: Extraocular movements intact sclera anicteric Neck: Supple trachea midline Heart: S1-S2 no rub appreciated Lungs: Clear to auscultation anteriorly he is unlabored at rest on my exam in the stretcher reclined Abdomen: Soft nontender mild distention Extremities: No edema appreciated no cyanosis or clubbing Skin: No rash on extremities exposed Neuro: Alert and oriented x3 provides a history which is supplemented by his ex-wife and follows commands GU Foley catheter is in place Psych normal mood and affect  Assessment/Plan:  # Acute kidney injury - Secondary to ischemic injury/ATN with hypotension as well as pre-renal insults in setting of n/v and lasix, olmesartan use in the setting of CKD stage 3.  Does have an indwelling foley - update renal panel - please hold his  home lasix and olmesartan - Would obtain CT abd/pelvis without contrast to rule out any reversible component however this appears less likely  - Not a candidate for hemodialysis - from a strictly renal standpoint there is not an indication for transfer to [facility] - Would continue goals of care discussion with primary team   # Hyponatremia - update stat renal panel - note corrected Na of 117 (hyperglycemia of 470 when Na 111) - Currently off of fluids - for now start normal saline at 75 ml/hr for BP   # Metabolic acidosis - Concerning for DKA - ER has initiated insulin gtt - update renal panel   # Hypotension with sepsis -  would hold home olmesartan and lasix with AKI and would hold amlodipine and metoprolol with hypotension - starting NS at 75 ml/hr for hypotension and s/p NS at 500 ml just started  # DM with apparent DKA - with metabolic acidosis and hyperglycemia - ER team has initiated insulin gtt and has ordered a blood gas  # CKD stage 3  - Baseline Cr appears 1.7 - 1.8  # Bladder cancer - Follows with urology - Dr. Gaynelle Arabian   # Perirectal abscess - Per general surgery   # anemia - normocytic  - Note setting of malignancy and perirectal abscess now on IV abx  Claudia Desanctis 06/11/2020, 7:02 PM   DKA per primary team.  BMP's ordered every 4 hours x 5 occurrences.  Na corrected is 118 - glucose is 440.  Aim for corrected low to mid 120's over 24 hours  Claudia Desanctis 9:49 PM 06/11/2020

## 2020-06-11 NOTE — Progress Notes (Signed)
Central Venous Catheter Insertion Procedure Note  [name]  [id number]  [birth year]  Date:06/11/20  Time:7:17 PM   Provider Performing:Shaylynn Nulty A Kendalynn Wideman   Procedure: Insertion of Non-tunneled Central Venous [phone]) with US guidance ([id number])   Indication(s) Medication administration and Difficult access  Consent Risks of the procedure as well as the alternatives and risks of each were explained to the patient and/or caregiver.  Consent for the procedure was obtained and is signed in the bedside chart  Anesthesia Topical only with 1% lidocaine   Timeout Verified patient identification, verified procedure, site/side was marked, verified correct patient position, special equipment/implants available, medications/allergies/relevant history reviewed, required imaging and test results available.  Sterile Technique Maximal sterile technique including full sterile barrier drape, hand hygiene, sterile gown, sterile gloves, mask, hair covering, sterile ultrasound probe cover (if used).  Procedure Description Area of catheter insertion was cleaned with chlorhexidine and draped in sterile fashion.  With real-time ultrasound guidance a central venous catheter was placed into the right internal jugular vein. Nonpulsatile blood flow and easy flushing noted in all ports.  The catheter was sutured in place and sterile dressing applied.  Complications/Tolerance None; patient tolerated the procedure well. Chest X-ray is ordered to verify placement for internal jugular or subclavian cannulation.   Chest x-ray is not ordered for femoral cannulation.  EBL Minimal  Specimen(s) None

## 2020-06-11 NOTE — ED Provider Notes (Signed)
[place] DEPT Provider Note   CSN: [id number] Arrival date & time: 06/11/20  1245     History Chief Complaint  Patient presents with  . Abscess    [name] is a 74 y.o. male.  He is here for evaluation of a perirectal abscess.  A prior history of bladder cancer and now has prostate cancer with mets to bone.  Has been on antibiotics for a week stopped 2 days ago.  Has had no fevers for 2 days but saw Dr. Lovena Neighbours from urology today who recommended he come to the hospital for surgical evaluation.  Patient is complaining of significant pain especially with sitting on head area.  His caregiver is his ex-wife.  She said he has not been able to ambulate for the past few days due to weakness.  Poor p.o. intake.  The history is provided by the patient.  Abscess Location:  Pelvis Pelvic abscess location:  L buttock and perianal Size:  6 Abscess quality: induration and painful   Progression:  Worsening Pain details:    Quality:  Throbbing   Severity:  Severe   Timing:  Constant   Progression:  Unchanged Context: diabetes   Relieved by:  Nothing Worsened by:  Nothing Ineffective treatments:  Oral antibiotics Associated symptoms: fatigue, fever, headaches, nausea and vomiting        Past Medical History:  Diagnosis Date  . At high risk for falls   . Bladder cancer [facility])    urologist-  dr Gaynelle Arabian  . CAD (coronary artery disease) cardiologist-  dr berry  . Cerebrovascular arteriosclerosis   . Gait disturbance, post-stroke    uses cane, rollator, and w/c long distance  . Hemiparkinsonism [facility]) neurologist-- dr Leta Baptist   right side body  . History of basal cell carcinoma excision    Sept 2016--  MOH's surgery side of nose  . History of carotid artery stenosis cardiologist-- dr berry   bilateral --  1999 s/p  bilateral ICA endarterectomy and recurrent restenosis 2012  s/p  staged bilateral stenting   per last duplex 2015  stents widely  patent  . History of CVA with residual deficit neurologist-  dr Leta Baptist   3/ 1999  Right MCA  and  12/ 2004  Anterior division of  Right MCA ---  residual left spactic hemiparesis and left foot drop  . History of MI (myocardial infarction)    02/ 2004   s/p  cabg   . HOH (hard of hearing)    LEFT EAR  POST cva  . Hyperextension deformity of left knee    wears brace  . Hyperlipidemia   . Hypertension   . Left foot drop    residual from CVA  -- wears brace  . Left spastic hemiparesis ([place])    residual CVA 1999  . Pancreatic pseudocyst   . Peripheral arterial disease ([place])   . S/P CABG x 4    02/ 2004  . Seizure disorder [facility]) last seizure 2011 due to confusion   neurologist-  dr Leta Baptist-- per note seizure documented 2008 breakthrough partial complex seizure (prior tonic-clonic seizure's post CVA)  . Type 2 diabetes, diet controlled ([place])     2  . Visual neglect    LEFT EYE    Patient Active Problem List   Diagnosis Date Noted  . History of bladder cancer 08/26/2019  . Abscess of buttock 08/26/2019  . Sepsis ([place]) 08/26/2019  . Hyponatremia 08/26/2019  . Normocytic anemia 08/26/2019  .  Hemoptysis 08/26/2019  . Hip pain, left 08/26/2019  . CHF exacerbation ([place]) 08/26/2019  . CRI (chronic renal insufficiency), stage 3 (moderate) 04/21/2019  . History of stroke 08/18/2018  . Essential hypertension 09/25/2015  . Essential tremor 04/19/2015  . Parkinson's disease ([place]) 06/28/2014  . Hx of CABG 03/24/2014  . Dyslipidemia, goal LDL below 70 03/24/2014  . Seizure disorder ([place]) 03/08/2013  . Spastic hemiplegia affecting nondominant side ([place]) 03/29/2012  . Carotid stenosis 10/20/2011    Past Surgical History:  Procedure Laterality Date  . CARDIAC CATHETERIZATION  11/17/2002   significant left main disease and 3 vessel CAD, mildly depressed LV systolic  fx, 17% left renal artery stenosis  . CAROTID ENDARTERECTOMY  12/1997   [facility])   Bilateral ICA  . CAROTID STENT  INSERTION Bilateral dr berry and dr Kristine Royal--  (In-stent Restenosis)  LeftICA   02-12-2011;  [place]   02-26-2011  . CORONARY ARTERY BYPASS GRAFT  2/ 2004   dr hendrickson   x3 LIMA to LAD,free right internal mammary artery to obtuse marginal 1,SVG to distal right coronary.  . CYSTOSCOPY W/ RETROGRADES Bilateral 09/10/2015   Procedure: CYSTOSCOPY WITH RETROGRADE PYELOGRAM;  Surgeon: Carolan Clines, MD;  Location[facility];  Service: Urology;  Laterality: Bilateral;  . CYSTOSCOPY WITH BIOPSY N/A 09/10/2015   Procedure: CYSTOSCOPY WITH BIOPSY,RIGHT TRIGONAL TUMOR 1.5 CM, EXCISIONAL BIOPSY WITH TAUBER FORCEP RIGHT POSTERIOR BLADDER WALL 1 CM, EXCISIONAL BIOPSY SATELITE BLADDER WALL 1 CM;  Surgeon: Carolan Clines, MD;  [facility];  Service: Urology;  Laterality: N/A;  . CYSTOSCOPY WITH BIOPSY N/A 03/26/2017   Procedure: CYSTOSCOPY WITH BIOPSY/;  Surgeon: Carolan Clines, MD;  Location: [facility];  Service: Urology;  Laterality: N/A;  . CYSTOSCOPY WITH HYDRODISTENSION AND BIOPSY N/A 10/29/2015   Procedure: REPEAT CYSTOSCOPY BIOPSY BLADDER DEEP MUSCLES BLADDER BIOPSY;  Surgeon: Carolan Clines, MD;  Location: [place];  Service: Urology;  Laterality: N/A;  . CYSTOSCOPY WITH URETHRAL DILATATION N/A 09/10/2015   Procedure: CYSTOSCOPY WITH URETHRAL DILATATION;  Surgeon: Carolan Clines, MD;  Location: [place];  Service: Urology;  Laterality: N/A;  . FULGURATION OF BLADDER TUMOR N/A 03/26/2017   Procedure: FULGURATION OF BLADDER TUMOR;  Surgeon: Carolan Clines, MD;  Location: [facility];  Service: Urology;  Laterality: N/A;  . [place]  . MOHS SURGERY  06-20-2015   side of nose  . NM MYOCAR PERF WALL MOTION  01/09/2011   dr berry   mild to mod. perfusion defect in the basal inferolateral & mid inferolaterl regions consistant with an infarct/scar, no signigicant ischemia  demonstracted/  Low Risk scan, no sig. change from previous study/  normal LV function and wall motion , ef 63%  . ORIF RIGHT ANKLE FX  1998   hardware retained  . TRANSURETHRAL RESECTION OF BLADDER TUMOR N/A 09/10/2015   Procedure: CYSTO TRANSURETHRAL RESECTION OF BLADDER TUMOR (TURBT) OF LEFT POSTERIOR BLADDER TUMOR 2 CM;  Surgeon: Carolan Clines, MD;  Location: [facility];  Service: Urology;  Laterality: N/A;  . TRANSURETHRAL RESECTION OF BLADDER TUMOR Left 04/17/2016   Procedure: TRANSURETHRAL RESECTION OF BLADDER TUMOR (TURBT);  Surgeon: Carolan Clines, MD;  Location: [facility];  Service: Urology;  Laterality: Left;  . US ECHOCARDIOGRAPHY  01/03/2011   normal LVF, ef>55%/  mild LAE/ trace MR and TR,  mild AV sclerosis without stenosis       Family History  Problem Relation Age of Onset  .  Stroke Father   . Heart attack Father   . Stroke Mother   . Diabetes Brother   . Hyperlipidemia Brother   . Hypertension Brother   . Hyperlipidemia Sister   . Hypertension Sister     Social History   Tobacco Use  . Smoking status: Former Smoker    Years: 5.00    Types: Cigarettes    Quit date: 11/14/2007    Years since quitting: 12.5  . Smokeless tobacco: Never Used  Substance Use Topics  . Alcohol use: No  . Drug use: No    Home Medications Prior to Admission medications   Medication Sig Start Date End Date Taking? Authorizing Provider  amLODipine (NORVASC) 10 MG tablet TAKE ONE TABLET BY MOUTH DAILY 10/17/19   Kerin Ransom K, PA-C  carbidopa-levodopa (SINEMET IR) 25-100 MG tablet Take 2 tablets by mouth 3 (three) times daily. Change: Take Two tabs 3 x daily 01/16/20   Penumalli, Earlean Polka, MD  cholecalciferol (VITAMIN D) 1000 units tablet Take 2,000 Units by mouth daily with breakfast.     [provider]  Coenzyme Q10 (COQ-10) 50 MG CAPS Take by mouth 2 (two) times daily.     [provider]  dantrolene (DANTRIUM) 50 MG capsule Take 1 capsule (50 mg  total) by mouth 2 (two) times daily. 09/01/19   Meredith Staggers, MD  dipyridamole-aspirin (AGGRENOX) 200-25 MG 12hr capsule TAKE ONE CAPSULE BY MOUTH TWICE A DAY 03/21/20   Penumalli, Earlean Polka, MD  ezetimibe (ZETIA) 10 MG tablet TAKE ONE TABLET BY MOUTH DAILY 12/19/19   Lorretta Harp, MD  fenofibrate (TRICOR) 48 MG tablet TAKE ONE TABLET BY MOUTH DAILY 04/24/20   Lorretta Harp, MD  Flaxseed, Linseed, 1000 MG CAPS Take 1,000 mg by mouth every evening.     [provider]  FOLBIC 2.5-25-2 MG TABS tablet Take 1 tablet by mouth once daily with food 05/24/20   Lomax, Amy, NP  furosemide (LASIX) 20 MG tablet Take 1 tablet (20 mg total) by mouth daily. 11/02/19 12/02/19  Erlene Quan, PA-C  hydrALAZINE (APRESOLINE) 25 MG tablet TAKE ONE TABLET BY MOUTH THREE TIMES A DAY 06/08/20   Troy Sine, MD  insulin lispro (HUMALOG KWIKPEN) 100 UNIT/ML KiwkPen Inject 4 Units into the skin See admin instructions. Takes 3 times daily before meals for sugar over 200 only    [provider]  KEPPRA 500 MG tablet TAKE ONE TABLET BY MOUTH TWICE A DAY 07/27/19   Penumalli, Earlean Polka, MD  metoprolol tartrate (LOPRESSOR) 25 MG tablet TAKE ONE TABLET BY MOUTH DAILY 12/14/19   Lorretta Harp, MD  nitroGLYCERIN (NITROSTAT) 0.4 MG SL tablet Place 1 tablet (0.4 mg total) under the tongue every 5 (five) minutes as needed for chest pain. 10/19/17   Lorretta Harp, MD  olmesartan (BENICAR) 20 MG tablet TAKE ONE TABLET BY MOUTH DAILY 03/05/20   Lorretta Harp, MD  [facility] DELICA LANCETS 29F [place]  07/29/17   [provider]  [facility] VERIO test strip  08/16/17   [provider]  RAPAFLO 8 MG CAPS capsule Take 8 mg by mouth daily with breakfast.  04/22/17   [provider]  rosuvastatin (CRESTOR) 20 MG tablet TAKE ONE TABLET BY MOUTH DAILY 04/26/20   Lorretta Harp, MD  TOUJEO SOLOSTAR 300 UNIT/ML [facility]  04/05/19   [provider]    Allergies    Altace  [ramipril]  Review of Systems   Review  of Systems  Constitutional: Positive for fatigue and fever.  HENT: Negative for sore throat.   Eyes: Negative for visual disturbance.  Respiratory: Negative for shortness of breath.   Cardiovascular: Negative for chest pain.  Gastrointestinal: Positive for diarrhea, nausea and vomiting. Negative for abdominal pain.  Genitourinary: Negative for dysuria.  Musculoskeletal: Positive for gait problem.  Skin: Positive for wound. Negative for rash.  Neurological: Positive for headaches.    Physical Exam Updated Vital Signs BP (!) 122/51 (BP Location: Right Arm)   Pulse 72   Temp (!) 97.5 F (36.4 C) (Oral)   Resp 15   SpO2 96%   Physical Exam Vitals and nursing note reviewed.  Constitutional:      Appearance: He is well-developed. He is ill-appearing.  HENT:     Head: Normocephalic and atraumatic.  Eyes:     Conjunctiva/sclera: Conjunctivae normal.  Cardiovascular:     Rate and Rhythm: Normal rate and regular rhythm.     Heart sounds: No murmur heard.   Pulmonary:     Effort: Tachypnea and accessory muscle usage present. No respiratory distress.     Breath sounds: Normal breath sounds.  Abdominal:     Palpations: Abdomen is soft.     Tenderness: There is no abdominal tenderness.  Genitourinary:    Comments: Perianal and onto the left buttock there is a 6 x 4 cm area of induration discoloration with some red-black, small pinhole with no active drainage.  Foley in place just replaced by Dr. Gilford Rile this morning. Musculoskeletal:        General: No deformity or signs of injury.     Cervical back: Neck supple.  Skin:    General: Skin is warm and dry.     Capillary Refill: Capillary refill takes less than 2 seconds.  Neurological:     Mental Status: He is alert.     Comments: He has had a prior stroke with residual left-sided weakness his left lower extremity is in AFO.  Awake and alert     ED Results / Procedures / Treatments    Labs (all labs ordered are listed, but only abnormal results are displayed) Labs Reviewed  COMPREHENSIVE METABOLIC PANEL - Abnormal; Notable for the following components:      Result Value   Sodium 111 (*)    Potassium 5.2 (*)    Chloride 81 (*)    CO2 13 (*)    Glucose, Bld 470 (*)    BUN 125 (*)    Creatinine, Ser 5.02 (*)    Calcium 8.1 (*)    Total Protein 6.2 (*)    Albumin 2.8 (*)    GFR calc non Af Amer 10 (*)    GFR calc Af Amer 12 (*)    Anion gap 17 (*)    All other components within normal limits  CBC WITH DIFFERENTIAL/PLATELET - Abnormal; Notable for the following components:   WBC 25.6 (*)    RBC 2.34 (*)    Hemoglobin 7.5 (*)    HCT 22.2 (*)    Neutro Abs 23.1 (*)    Lymphs Abs 0.4 (*)    Monocytes Absolute 1.5 (*)    Abs Immature Granulocytes 0.53 (*)    All other components within normal limits  URINALYSIS, ROUTINE W REFLEX MICROSCOPIC - Abnormal; Notable for the following components:   APPearance HAZY (*)    Glucose, UA 150 (*)    Ketones, ur 5 (*)    Leukocytes,Ua MODERATE (*)  Crystals PRESENT (*)    All other components within normal limits  BRAIN NATRIURETIC PEPTIDE - Abnormal; Notable for the following components:   B Natriuretic Peptide 1,002.8 (*)    All other components within normal limits  BETA-HYDROXYBUTYRIC ACID - Abnormal; Notable for the following components:   Beta-Hydroxybutyric Acid 4.79 (*)    All other components within normal limits  BLOOD GAS, VENOUS - Abnormal; Notable for the following components:   pH, Ven 7.242 (*)    pCO2, Ven 26.4 (*)    pO2, Ven 63.9 (*)    Bicarbonate 11.0 (*)    Acid-base deficit 14.9 (*)    All other components within normal limits  RENAL FUNCTION PANEL - Abnormal; Notable for the following components:   Sodium 113 (*)    Potassium 5.2 (*)    Chloride 80 (*)    CO2 11 (*)    Glucose, Bld 440 (*)    BUN 120 (*)    Creatinine, Ser 5.26 (*)    Calcium 8.2 (*)    Phosphorus 6.4 (*)    Albumin  2.4 (*)    GFR calc non Af Amer 10 (*)    GFR calc Af Amer 11 (*)    Anion gap 22 (*)    All other components within normal limits  CBC - Abnormal; Notable for the following components:   WBC 20.9 (*)    RBC 1.89 (*)    Hemoglobin 6.0 (*)    HCT 17.8 (*)    All other components within normal limits  BASIC METABOLIC PANEL - Abnormal; Notable for the following components:   Sodium 118 (*)    Chloride 88 (*)    CO2 15 (*)    Glucose, Bld 233 (*)    BUN 112 (*)    Creatinine, Ser 5.07 (*)    Calcium 8.0 (*)    GFR calc non Af Amer 10 (*)    GFR calc Af Amer 12 (*)    All other components within normal limits  BLOOD GAS, ARTERIAL - Abnormal; Notable for the following components:   pH, Arterial 7.340 (*)    pCO2 arterial 25.6 (*)    pO2, Arterial 62.0 (*)    Bicarbonate 13.4 (*)    Acid-base deficit 11.1 (*)    All other components within normal limits  PHOSPHORUS - Abnormal; Notable for the following components:   Phosphorus 5.4 (*)    All other components within normal limits  BASIC METABOLIC PANEL - Abnormal; Notable for the following components:   Sodium 117 (*)    Chloride 88 (*)    CO2 16 (*)    Glucose, Bld 246 (*)    BUN 104 (*)    Creatinine, Ser 5.02 (*)    Calcium 7.9 (*)    GFR calc non Af Amer 10 (*)    GFR calc Af Amer 12 (*)    All other components within normal limits  GLUCOSE, CAPILLARY - Abnormal; Notable for the following components:   Glucose-Capillary 252 (*)    All other components within normal limits  CORTISOL-AM, BLOOD - Abnormal; Notable for the following components:   Cortisol - AM 30.2 (*)    All other components within normal limits  GLUCOSE, CAPILLARY - Abnormal; Notable for the following components:   Glucose-Capillary 198 (*)    All other components within normal limits  GLUCOSE, CAPILLARY - Abnormal; Notable for the following components:   Glucose-Capillary 209 (*)    All other  components within normal limits  GLUCOSE, CAPILLARY -  Abnormal; Notable for the following components:   Glucose-Capillary 234 (*)    All other components within normal limits  GLUCOSE, CAPILLARY - Abnormal; Notable for the following components:   Glucose-Capillary 235 (*)    All other components within normal limits  GLUCOSE, CAPILLARY - Abnormal; Notable for the following components:   Glucose-Capillary 212 (*)    All other components within normal limits  GLUCOSE, CAPILLARY - Abnormal; Notable for the following components:   Glucose-Capillary 233 (*)    All other components within normal limits  GLUCOSE, CAPILLARY - Abnormal; Notable for the following components:   Glucose-Capillary 230 (*)    All other components within normal limits  CBG MONITORING, ED - Abnormal; Notable for the following components:   Glucose-Capillary 427 (*)    All other components within normal limits  CBG MONITORING, ED - Abnormal; Notable for the following components:   Glucose-Capillary 445 (*)    All other components within normal limits  CBG MONITORING, ED - Abnormal; Notable for the following components:   Glucose-Capillary 390 (*)    All other components within normal limits  CBG MONITORING, ED - Abnormal; Notable for the following components:   Glucose-Capillary 374 (*)    All other components within normal limits  CBG MONITORING, ED - Abnormal; Notable for the following components:   Glucose-Capillary 347 (*)    All other components within normal limits  CULTURE, BLOOD (ROUTINE X 2)  CULTURE, BLOOD (ROUTINE X 2)  SARS CORONAVIRUS 2 BY RT PCR (HOSPITAL ORDER, [facility])  AEROBIC CULTURE (SUPERFICIAL SPECIMEN)  URINE CULTURE  MRSA PCR SCREENING  LACTIC ACID, PLASMA  PROTIME-INR  SODIUM, URINE, RANDOM  CREATININE, URINE, RANDOM  MAGNESIUM  LACTIC ACID, PLASMA  SODIUM, URINE, RANDOM  PROTIME-INR  APTT  BASIC METABOLIC PANEL  BASIC METABOLIC PANEL  BASIC METABOLIC PANEL  SODIUM  OSMOLALITY  OSMOLALITY, URINE   TYPE AND SCREEN  ABO/RH  PREPARE RBC (CROSSMATCH)    EKG None  Radiology CT ABDOMEN PELVIS WO CONTRAST  Addendum Date: 06/11/2020   ADDENDUM REPORT: 06/11/2020 21:43 ADDENDUM: Few borderline enlarged retroperitoneal lymph nodes, stable to decreased in size from comparison imaging. Electronically Signed   By: Lovena Le M.D.   On: 06/11/2020 21:43   Result Date: 06/11/2020 CLINICAL DATA:  Acute renal failure, metastatic prostate cancer and perianal abscess EXAM: CT ABDOMEN AND PELVIS WITHOUT CONTRAST TECHNIQUE: Multidetector CT imaging of the abdomen and pelvis was performed following the standard protocol without IV contrast. COMPARISON:  Wide for abscess cysts cough price FINDINGS: Lower chest: Small bilateral effusions. Adjacent areas of passive atelectasis. Underlying consolidation not excluded. Normal cardiac size. Pacer leads appear directed towards the coronary sinus. Coronary artery atherosclerosis is noted as well. Evidence of prior sternotomy and CABG. Hypoattenuation of the cardiac blood pool suggestive of anemia. Hepatobiliary: Borderline hepatomegaly similar to comparison. Smooth liver surface contour. No visible focal liver lesion on this unenhanced CT. Post cholecystectomy. No significant biliary ductal dilatation or visible intraductal gallstones. Pancreas: Stable atrophy of the pancreatic body and tail. No pancreatic ductal dilatation or surrounding inflammatory changes. Spleen: Normal in size. No concerning splenic lesions. Small accessory splenule. Adrenals/Urinary Tract: Lobular thickening of the adrenal glands, similar to comparison studies, without discernible focal adrenal lesion. Moderate bilateral perinephric stranding, overall similar to comparison CT from August 2021. Bilateral renal hilar calcifications are likely vascular in nature without discernible urolithiasis or hydronephrosis. Bladder decompressed by  inflated Foley catheter balloon albeit with some mild wall  thickening and perivesicular hazy stranding, nonspecific in the setting of instrumentation. Stomach/Bowel: Distal esophagus, stomach and duodenal sweep are unremarkable. No small bowel wall thickening or dilatation. No evidence of obstruction. Appendix is not visualized. Normal caliber appendix is present though does course in the vicinity of adjacent free fluid in the right pericolic gutter. Question some mild mural thickening of the colon versus underdistention. Perianal gas and possible fistulous connection, as detailed below. Vascular/Lymphatic: Atherosclerotic calcifications within the abdominal aorta and branch vessels. No aneurysm or ectasia. Stable appearance of a borderline enlarged right retrocaval lymph node measuring 1.1 cm (2/35) diminished conspicuity of a right external iliac lymph node now measuring only 7 mm in size, previously 0.9 cm (2/69) stable appearance of a separate borderline enlarged right external iliac node measuring up to 1.1 cm short axis (2/80). No other concerning adenopathy. Reproductive: Mild prostatomegaly, traversed by the inflated Foley catheter balloon. Lobular thickening of the seminal vesicles is a nonspecific finding. No acute abnormality of the external genitalia. Soft tissue gas and stranding extends to the base of the left penile corpora and spongiosum. Other: Soft tissue thickening and perianal abscess with soft tissue defect of the left ischioanal fossa (2/97. A tract of gas lucencies extends from the soft tissue thickening seen just laterally to the base of the left penile corpora cavernosum and spongiosum anteriorly and to the margin of the thickened levator plate with a small amount of intersphincteric low-attenuation fluid and gas seen on coronal imaging (4/90). Small volume of low-attenuation free fluid seen layering in the pericolic gutters and pelvis, nonspecific. Moderate body wall edema. Musculoskeletal: Multilevel sclerotic lesions present throughout the  included thoracolumbar spine and bony pelvis. Overall extent and distribution similar to comparison. Multilevel degenerative changes in the spine hips and pelvis as well as the left wrist, partially included within the level of imaging. IMPRESSION: 1. Soft tissue thickening and perianal ulcer with soft tissue defect of the left ischioanal fossa. 2. A tract of gas lucency is seen extending from the region of skin thickening and defect lateral to the ulcer base anteromedially to the left penile corpora cavernosum and spongiosum anteriorly and to the margin of the thickened left levator plate with a small amount of intersphincteric low-attenuation fluid and gas, concerning fistulous connection. A discrete abscess/collection is not discernible at this time. 3. Question some mild mural thickening of the colon versus underdistention of the cecum. Correlate with clinical symptoms and consider direct visualization. 4. Small volume of low-attenuation free fluid seen layering in the pericolic gutters and pelvis, nonspecific. 5. Small bilateral effusions with adjacent areas of passive atelectasis. Underlying consolidation not excluded. 6. Moderate body wall edema. Correlate for features of anasarca/volume third-spacing. 7. Multilevel sclerotic lesions throughout the included thoracolumbar spine and bony pelvis, consistent with metastatic disease. 8. Mild prostatomegaly and lobular thickening of the seminal vesicles. May correlate with history of patient's prostate cancer. 9. Moderate bilateral perinephric stranding, overall similar to comparison CT from August 2021. Findings could reflect senescent change, however, if urinary symptoms are present recommend correlation with urinalysis to exclude infection. 10. Aortic Atherosclerosis (ICD10-I70.0). Electronically Signed: By: Lovena Le M.D. On: 06/11/2020 20:55   DG Chest Port 1 View  Result Date: 06/11/2020 CLINICAL DATA:  Central line placement EXAM: PORTABLE CHEST 1  VIEW COMPARISON:  Radiograph 06/11/2020, CT 06/06/2020 FINDINGS: Right IJ catheter tip terminates at the level of the right atrium. Prior sternotomy and features of CABG. Stable cardiomediastinal  contours. Atelectatic changes and vascular crowding are similar to comparison. Some abundant mediastinal and subpleural fat is similar to comparison CT. No convincing pneumothorax or effusion. No new focal consolidative opacity or features of edema. No acute osseous or soft tissue abnormality. Telemetry leads overlie the chest. Surgical clips at the base of the neck and evidence of left carotid stenting. IMPRESSION: 1. Right IJ catheter tip terminates at the level of the right atrium. 2. No convincing pneumothorax. 3. Stable bilateral atelectasis and vascular crowding. Electronically Signed   By: Lovena Le M.D.   On: 06/11/2020 20:05   DG Chest Port 1 View  Result Date: 06/11/2020 CLINICAL DATA:  Dyspnea EXAM: PORTABLE CHEST 1 VIEW COMPARISON:  [id number] FINDINGS: Minimal left basilar atelectasis. Lungs are otherwise clear. No pneumothorax or pleural effusion. Coronary artery bypass grafting has been performed. Cardiac size is within normal limits. Pulmonary vascularity is normal. No acute bone abnormality. IMPRESSION: No active disease. Electronically Signed   By: Fidela Salisbury MD   On: 06/11/2020 16:37    Procedures .Critical Care Performed by: Hayden Rasmussen, MD Authorized by: Hayden Rasmussen, MD   Critical care provider statement:    Critical care time (minutes):  45   Critical care time was exclusive of:  Separately billable procedures and treating other patients   Critical care was necessary to treat or prevent imminent or life-threatening deterioration of the following conditions:  Circulatory failure, sepsis and metabolic crisis   Critical care was time spent personally by me on the following activities:  Discussions with consultants, evaluation of patient's response to treatment, examination of  patient, ordering and performing treatments and interventions, ordering and review of laboratory studies, ordering and review of radiographic studies, pulse oximetry, re-evaluation of patient's condition, obtaining history from patient or surrogate, review of old charts and development of treatment plan with patient or surrogate   I assumed direction of critical care for this patient from another provider in my specialty: no     (including critical care time)  Medications Ordered in ED Medications  insulin regular, human (MYXREDLIN) 100 units/ 100 mL infusion (8.5 Units/hr Intravenous Rate/Dose Change 06/12/20 1000)  dextrose 5 % in lactated ringers infusion ( Intravenous New Bag/Given 06/12/20 0910)  dextrose 50 % solution 0-50 mL (has no administration in time range)  0.9 %  sodium chloride infusion ( Intravenous Stopped 06/12/20 0635)  docusate sodium (COLACE) capsule 100 mg (has no administration in time range)  polyethylene glycol (MIRALAX / GLYCOLAX) packet 17 g (has no administration in time range)  heparin injection 5,000 Units (5,000 Units Subcutaneous Given 06/12/20 0558)  0.9 %  sodium chloride infusion (0 mLs Intravenous Stopped 06/12/20 0108)  carbidopa-levodopa (SINEMET IR) 25-100 MG per tablet immediate release 2 tablet (2 tablets Oral Given 06/11/20 2135)  dantrolene (DANTRIUM) capsule 50 mg (50 mg Oral Given 06/11/20 2135)  dipyridamole-aspirin (AGGRENOX) 200-25 MG per 12 hr capsule 1 capsule (1 capsule Oral Given 06/11/20 2135)  levETIRAcetam (KEPPRA XR) 24 hr tablet 500 mg (500 mg Oral Given 06/11/20 2135)  metroNIDAZOLE (FLAGYL) IVPB 500 mg (0 mg Intravenous Stopped 06/12/20 0910)  vancomycin variable dose per unstable renal function (pharmacist dosing) (has no administration in time range)  ceFEPIme (MAXIPIME) 1 g in sodium chloride 0.9 % 100 mL IVPB (0 g Intravenous Stopped 06/12/20 0749)  norepinephrine (LEVOPHED) 4mg  in 228mL premix infusion (10 mcg/min Intravenous Rate/Dose Change  06/12/20 1001)  norepinephrine (LEVOPHED) 4-5 MG/250ML-% infusion SOLN (  Canceled Entry 06/11/20 2340)  Chlorhexidine Gluconate Cloth 2 % PADS 6 each (6 each Topical Given by Other 06/12/20 0909)  MEDLINE mouth rinse (has no administration in time range)  chlorhexidine (PERIDEX) 0.12 % solution 15 mL (has no administration in time range)  sodium chloride (hypertonic) 3 % solution ( Intravenous Rate/Dose Verify 06/12/20 0842)  phenylephrine (NEOSYNEPHRINE) 10-0.9 MG/250ML-% infusion (  Canceled Entry 06/12/20 0330)  vasopressin (PITRESSIN) 20 Units in sodium chloride 0.9 % 100 mL infusion-*FOR SHOCK* (0.03 Units/min Intravenous Rate/Dose Change 06/12/20 0712)  morphine 4 MG/ML injection 4 mg (4 mg Intravenous Given 06/11/20 1539)  piperacillin-tazobactam (ZOSYN) IVPB 3.375 g (0 g Intravenous Stopped 06/11/20 1728)  lactated ringers bolus 500 mL (0 mLs Intravenous Stopped 06/11/20 1735)  vancomycin (VANCOREADY) IVPB 1500 mg/300 mL (0 mg Intravenous Stopped 06/11/20 2207)  sodium chloride 0.9 % bolus 500 mL (0 mLs Intravenous Stopped 06/11/20 1900)  albumin human 25 % solution 25 g (0 g Intravenous Stopped 06/11/20 2120)  sodium chloride 0.9 % bolus 500 mL (0 mLs Intravenous Stopped 06/12/20 0038)  albumin human 5 % solution 12.5 g (0 g Intravenous Stopped 06/12/20 1005)  0.9 %  sodium chloride infusion (Manually program via Guardrails IV Fluids) ( Intravenous New Bag/Given 06/12/20 1002)    ED Course  I have reviewed the triage vital signs and the nursing notes.  Pertinent labs & imaging results that were available during my care of the patient were reviewed by me and considered in my medical decision making (see chart for details).  Clinical Course as of Jun 12 1002  Mon Jun 11, 2020  1613 Patient's labs showing critical potassium 111.  Have started him on some LR.  We will put a call into nephrology as he is also in an AKI.   [MB]  1615 Discussed with PA Claiborne Billings from general surgery who will evaluate the  patient.   [MB]  3825 Chest x-ray showing cardiomegaly prior CABG no acute infiltrates.   [MB]  0539 Cardiac echo - 11/20 -1. Left ventricular ejection fraction, by visual estimation, is 60 to  65%. The left ventricle has normal function. There is moderately increased  left ventricular hypertrophy.  2. Left ventricular diastolic parameters are consistent with Grade I  diastolic dysfunction (impaired relaxation).  3. Global right ventricle has normal systolic function.The right  ventricular size is normal. No increase in right ventricular wall  thickness.  4. Left atrial size was mildly dilated.  5. Right atrial size was normal.  6. Mild mitral annular calcification.  7. The mitral valve is abnormal. Mild mitral valve regurgitation.  8. The tricuspid valve is grossly normal. Tricuspid valve regurgitation  is trivial.  9. The aortic valve is tricuspid. Aortic valve regurgitation is not  visualized. Mild aortic valve sclerosis without stenosis.  10. The pulmonic valve was grossly normal. Pulmonic valve regurgitation is  trivial.  11. Mildly elevated pulmonary artery systolic pressure.  12. The inferior vena cava is normal in size with <50% respiratory  variability, suggesting right atrial pressure of 8 mmHg.    [MB]  1709 Discussed with Dr. Royce Macadamia nephrology.  She did not recommend any continuous fluid drips at this time.  She is thinking he would be better served by being on [place] for an admission in case he ends up needing dialysis.   [MB]  7673 Discussed with [facility] hospitalist, he asked if we can get a beta hydroxybutyrate as he is concerned he may be in DKA.  Also asked to extend antibiotic coverage to  Vanco per pharmacy consult.  Asked if critical care could weigh in on whether he needs to go to the ICU.   [MB]  9753 Discussed the case with critical care consultant Dr. Ander Slade who will evaluate the patient in ED for possible admission.   [MB]    Clinical Course User  Index [MB] Hayden Rasmussen, MD   MDM Rules/Calculators/A&P                         This patient complains of perirectal abscess and pain, generalized weakness; this involves an extensive number of treatment Options and is a complaint that carries with it a high risk of complications and Morbidity. The differential includes sepsis, Sirs, abscess, metabolic derangement, renal failure, anemia, Covid, pneumonia  I ordered, reviewed and interpreted labs, which included CBC with elevated white count, hemoglobin lower than baseline, chemistries markedly abnormal with critically low sodium, elevated potassium, low bicarb elevated glucose, elevated BUN and creatinine reflecting acute onset renal failure and possible DKA I ordered medication IV fluids, IV insulin, IV antibiotics I ordered imaging studies which included chest x-ray, CT abdomen and pelvis and I independently    visualized and interpreted imaging which showed no obvious infiltrates, CAT scan with multiple findings, inflammatory or infectious changes left buttock with no discrete abscess Additional history obtained from patient's ex-wife/caregiver Previous records obtained and reviewed in epic, including last admission back in November I consulted Dr. Royce Macadamia nephrology, Dr. Ander Slade PCCM and discussed lab and imaging findings  Critical Interventions: Evaluation and management of acute renal failure, Sirs, critical hyponatremia  After the interventions stated above, I reevaluated the patient and found patient to be awake and alert.  Still is critically ill and will need admission to the ICU for further management.   Final Clinical Impression(s) / ED Diagnoses Final diagnoses:  Hyponatremia  AKI (acute kidney injury) ([place])  Perirectal abscess  Prostate cancer ([place])  Hypotension, unspecified hypotension type    Rx / DC Orders ED Discharge Orders    None       Hayden Rasmussen, MD 06/12/20 1011

## 2020-06-11 NOTE — ED Notes (Signed)
Date and time results received: 06/11/20 4:04 PM    Test: sodium Critical Value: 111  Name of Provider Notified: Melina Copa, md

## 2020-06-11 NOTE — ED Notes (Signed)
Critical care at bedside  

## 2020-06-11 NOTE — Progress Notes (Addendum)
[place] Progress Note Patient Name: [name] DOB: [birth year] MRN: [id number]   Date of Service  06/11/2020  HPI/Events of Note  Patient admitted with sepsis from a rectal abscess and DKA, he is now hypotensive, CVP 8, BNP 1,002.  eICU Interventions  Normal saline 500 ml iv bolus x 1, Norepinephrine infusion, arterial line insertion, check lactic acid level. New Patient Evaluation completed.        Kerry Kass Janaisa Birkland 06/11/2020, 11:38 PM

## 2020-06-11 NOTE — Progress Notes (Signed)
A consult was received from an ED physician for vancomycin per pharmacy dosing.  The patient's profile has been reviewed for ht/wt/allergies/indication/available labs.    A one time order has been placed for vancomycin 1500 mg IV once.    Further antibiotics/pharmacy consults should be ordered by admitting physician if indicated.                       Thank you, Lenis Noon, PharmD 06/11/2020  5:36 PM

## 2020-06-11 NOTE — Consult Note (Signed)
[facility] Surgery Consult Note  [name] 1946/04/09  [id number].    Requesting MD: Melina Copa Chief Complaint/Reason for Consult: perirectal abscess HPI:  Patient is a 74 year old male who presented to [facility] today with perirectal wound that has been present for about 1 week. He was put on keflex by PCP. Wound started draining 2 days ago and ex-wife, who is his caregiver, reports that drainage was like cottage cheese in appearance. Area is tender. Associated fevers up to 101.6 at home. Blood sugars have been running over 300 at home which is not typical for patient. Patient reports anorexia. 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 His sons have been helping to carry him around the house. He also had an episode of bowel incontinence in the waiting room. He has a chronic indwelling foley that has been in place since May of this year and is changed monthly. 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 He has had prior skin abscesses in the same area. He is on aggrenox but no other blood thinning medications. Allergic to ramipril. Ex-wife is at bedside and is his primary caregiver.   ROS: Review of Systems  Constitutional: Positive for chills, fever and malaise/fatigue.  Respiratory: Negative for shortness of breath and wheezing.   Cardiovascular: Negative for chest pain and palpitations.  Gastrointestinal: Negative for abdominal pain, blood in stool, constipation, diarrhea, melena, nausea and vomiting.  Genitourinary:       Chronic foley; L gluteal wound with drainage   Neurological: Positive for weakness.  All other systems reviewed and are negative.   Family History  Problem Relation Age of Onset  . Stroke Father   . Heart attack  Father   . Stroke Mother   . Diabetes Brother   . Hyperlipidemia Brother   . Hypertension Brother   . Hyperlipidemia Sister   . Hypertension Sister     Past Medical History:  Diagnosis Date  . At high risk for falls   . Bladder cancer [facility])    urologist-  dr Gaynelle Arabian  . CAD (coronary artery disease) cardiologist-  dr berry  . Cerebrovascular arteriosclerosis   . Gait disturbance, post-stroke    uses cane, rollator, and w/c long distance  . Hemiparkinsonism [facility]) neurologist-- dr Leta Baptist   right side body  . History of basal cell carcinoma excision    Sept 2016--  MOH's surgery side of nose  . History of carotid artery stenosis cardiologist-- dr berry   bilateral --  1999 s/p  bilateral ICA endarterectomy and recurrent restenosis 2012  s/p  staged bilateral stenting   per last duplex 2015  stents widely patent  . History of CVA with residual deficit neurologist-  dr Leta Baptist   3/ 1999  Right MCA  and  12/ 2004  Anterior division of  Right MCA ---  residual left spactic hemiparesis and left foot drop  . History of MI (myocardial infarction)    02/ 2004   s/p  cabg   . HOH (hard of hearing)    LEFT EAR  POST cva  . Hyperextension deformity of left knee    wears brace  . Hyperlipidemia   . Hypertension   . Left foot drop    residual from CVA  -- wears brace  .  Left spastic hemiparesis ([place])    residual CVA 1999  . Pancreatic pseudocyst   . Peripheral arterial disease ([place])   . S/P CABG x 4    02/ 2004  . Seizure disorder [facility]) last seizure 2011 due to confusion   neurologist-  dr Leta Baptist-- per note seizure documented 2008 breakthrough partial complex seizure (prior tonic-clonic seizure's post CVA)  . Type 2 diabetes, diet controlled ([place])     2  . Visual neglect    LEFT EYE    Past Surgical History:  Procedure Laterality Date  . CARDIAC CATHETERIZATION  11/17/2002   significant left main disease and 3 vessel CAD, mildly depressed LV systolic  fx, 38% left renal  artery stenosis  . CAROTID ENDARTERECTOMY  12/1997   [facility])   Bilateral ICA  . CAROTID STENT INSERTION Bilateral dr berry and dr Kristine Royal--  (In-stent Restenosis)  LeftICA   02-12-2011;  [place]   02-26-2011  . CORONARY ARTERY BYPASS GRAFT  2/ 2004   dr hendrickson   x3 LIMA to LAD,free right internal mammary artery to obtuse marginal 1,SVG to distal right coronary.  . CYSTOSCOPY W/ RETROGRADES Bilateral 09/10/2015   Procedure: CYSTOSCOPY WITH RETROGRADE PYELOGRAM;  Surgeon: Carolan Clines, MD;  Location: [facility];  Service: Urology;  Laterality: Bilateral;  . CYSTOSCOPY WITH BIOPSY N/A 09/10/2015   Procedure: CYSTOSCOPY WITH BIOPSY,RIGHT TRIGONAL TUMOR 1.5 CM, EXCISIONAL BIOPSY WITH TAUBER FORCEP RIGHT POSTERIOR BLADDER WALL 1 CM, EXCISIONAL BIOPSY SATELITE BLADDER WALL 1 CM;  Surgeon: Carolan Clines, MD;  Location[facility]  Service: Urology;  Laterality: N/A;  . CYSTOSCOPY WITH BIOPSY N/A 03/26/2017   Procedure: CYSTOSCOPY WITH BIOPSY/;  Surgeon: Carolan Clines, MD;  Location: [facility];  Service: Urology;  Laterality: N/A;  . CYSTOSCOPY WITH HYDRODISTENSION AND BIOPSY N/A 10/29/2015   Procedure: REPEAT CYSTOSCOPY BIOPSY BLADDER DEEP MUSCLES BLADDER BIOPSY;  Surgeon: Carolan Clines, MD;  Location: [place];  Service: Urology;  Laterality: N/A;  . CYSTOSCOPY WITH URETHRAL DILATATION N/A 09/10/2015   Procedure: CYSTOSCOPY WITH URETHRAL DILATATION;  Surgeon: Carolan Clines, MD;  Location: [place];  Service: Urology;  Laterality: N/A;  . FULGURATION OF BLADDER TUMOR N/A 03/26/2017   Procedure: FULGURATION OF BLADDER TUMOR;  Surgeon: Carolan Clines, MD;  Location: [facility];  Service: Urology;  Laterality: N/A;  . [place]  . MOHS SURGERY  06-20-2015   side of nose  . NM MYOCAR PERF WALL MOTION  01/09/2011   dr berry   mild to mod. perfusion defect in the basal  inferolateral & mid inferolaterl regions consistant with an infarct/scar, no signigicant ischemia demonstracted/  Low Risk scan, no sig. change from previous study/  normal LV function and wall motion , ef 63%  . ORIF RIGHT ANKLE FX  1998   hardware retained  . TRANSURETHRAL RESECTION OF BLADDER TUMOR N/A 09/10/2015   Procedure: CYSTO TRANSURETHRAL RESECTION OF BLADDER TUMOR (TURBT) OF LEFT POSTERIOR BLADDER TUMOR 2 CM;  Surgeon: Carolan Clines, MD;  Location: [facility];  Service: Urology;  Laterality: N/A;  . TRANSURETHRAL RESECTION OF BLADDER TUMOR Left 04/17/2016   Procedure: TRANSURETHRAL RESECTION OF BLADDER TUMOR (TURBT);  Surgeon: Carolan Clines, MD;  Location: [facility];  Service: Urology;  Laterality: Left;  . US ECHOCARDIOGRAPHY  01/03/2011   normal LVF, ef>55%/  mild LAE/ trace MR and TR,  mild AV sclerosis without stenosis    Social History:  reports that he  quit smoking about 12 years ago. His smoking use included cigarettes. He quit after 5.00 years of use. He has never used smokeless tobacco. He reports that he does not drink alcohol and does not use drugs.  Allergies:  Allergies  Allergen Reactions  . Altace [Ramipril] Cough    (Not in a hospital admission)   Blood pressure (!) 126/108, pulse 68, temperature 97.7 F (36.5 C), temperature source Oral, resp. rate (!) 22, height 5\' 4"  (1.626 m), weight 66.7 kg, SpO2 94 %. Physical Exam:  General: pleasant, WD, chronically ill appearing male who is laying in bed in NAD HEENT:  Sclera are noninjected.  PERRL.  Ears and nose without any masses or lesions.  Mouth is pink and moist Heart: regular, rate, and rhythm.  Normal s1,s2. No obvious murmurs, gallops, or rubs noted.  Palpable radial and pedal pulses bilaterally Lungs: CTAB, no wheezes, rhonchi, or rales noted.  Respiratory effort nonlabored Abd: soft, NT, ND, +BS, no masses, hernias, or organomegaly GU: L gluteal wound as seen below with eschar  overlying previously and seen inferiorly after debridement, no pockets of purulence expressed     MS: LUE contracted with hematomas and ecchymosis present, BLEs without significant edema  Skin: warm and dry with no masses, lesions, or rashes Neuro: Cranial nerves 2-12 grossly intact Psych: A&Ox3 with an appropriate affect.   Results for orders placed or performed during the hospital encounter of 06/11/20 (from the past 48 hour(s))  CBG monitoring, ED     Status: Abnormal   Collection Time: 06/11/20  1:40 PM  Result Value Ref Range   Glucose-Capillary 427 (H) 70 - 99 mg/dL    Comment: Glucose reference range applies only to samples taken after fasting for at least 8 hours.  Lactic acid, plasma     Status: None   Collection Time: 06/11/20  1:46 PM  Result Value Ref Range   Lactic Acid, Venous 1.3 0.5 - 1.9 mmol/L    Comment: Performed at [facility], [place] [address] [place], [place] [address]  Comprehensive metabolic panel     Status: Abnormal   Collection Time: 06/11/20  2:34 PM  Result Value Ref Range   Sodium 111 (LL) 135 - 145 mmol/L    Comment: CRITICAL RESULT CALLED TO, READ BACK BY AND VERIFIED WITH: FARRAR,S. RN @1604  06/11/20 BILLINGSLEY,L    Potassium 5.2 (H) 3.5 - 5.1 mmol/L   Chloride 81 (L) 98 - 111 mmol/L   CO2 13 (L) 22 - 32 mmol/L   Glucose, Bld 470 (H) 70 - 99 mg/dL    Comment: Glucose reference range applies only to samples taken after fasting for at least 8 hours.   BUN 125 (H) 8 - 23 mg/dL    Comment: RESULTS CONFIRMED BY MANUAL DILUTION   Creatinine, Ser 5.02 (H) 0.61 - 1.24 mg/dL   Calcium 8.1 (L) 8.9 - 10.3 mg/dL   Total Protein 6.2 (L) 6.5 - 8.1 g/dL   Albumin 2.8 (L) 3.5 - 5.0 g/dL   AST 18 15 - 41 U/L   ALT 7 0 - 44 U/L   Alkaline Phosphatase 91 38 - 126 U/L   Total Bilirubin 0.9 0.3 - 1.2 mg/dL   GFR calc non Af Amer 10 (L) >60 mL/min   GFR calc Af Amer 12 (L) >60 mL/min   Anion gap 17 (H) 5 - 15    Comment: Performed at [facility], [place] [address] [place], [place] [address]  CBC  with Differential     Status: Abnormal   Collection Time: 06/11/20  2:34 PM  Result Value Ref Range   WBC 25.6 (H) 4.0 - 10.5 K/uL   RBC 2.34 (L) 4.22 - 5.81 MIL/uL   Hemoglobin 7.5 (L) 13.0 - 17.0 g/dL   HCT 22.2 (L) 39 - 52 %   MCV 94.9 80.0 - 100.0 fL   MCH 32.1 26.0 - 34.0 pg   MCHC 33.8 30.0 - 36.0 g/dL   RDW 12.7 11.5 - 15.5 %   Platelets 251 150 - 400 K/uL   nRBC 0.0 0.0 - 0.2 %   Neutrophils Relative % 90 %   Neutro Abs 23.1 (H) 1.7 - 7.7 K/uL   Lymphocytes Relative 2 %   Lymphs Abs 0.4 (L) 0.7 - 4.0 K/uL   Monocytes Relative 6 %   Monocytes Absolute 1.5 (H) 0 - 1 K/uL   Eosinophils Relative 0 %   Eosinophils Absolute 0.0 0 - 0 K/uL   Basophils Relative 0 %   Basophils Absolute 0.0 0 - 0 K/uL   Immature Granulocytes 2 %   Abs Immature Granulocytes 0.53 (H) 0.00 - 0.07 K/uL   Polychromasia PRESENT     Comment: Performed at [facility], [place] [address], [place], [place] [address]  Protime-INR     Status: None   Collection Time: 06/11/20  2:34 PM  Result Value Ref Range   Prothrombin Time 14.2 11.4 - 15.2 seconds   INR 1.1 0.8 - 1.2    Comment: (NOTE) INR goal varies based on device and disease states. Performed at [facility], [place] [address], [place], [place] [address]   Brain natriuretic peptide     Status: Abnormal   Collection Time: 06/11/20  2:34 PM  Result Value Ref Range   B Natriuretic Peptide 1,002.8 (H) 0.0 - 100.0 pg/mL    Comment: Performed at [facility], [place] [address], [place], [place] [address]  Urinalysis, Routine w reflex microscopic Urine, Clean Catch     Status: Abnormal   Collection Time: 06/11/20  3:19 PM  Result Value Ref Range   Color, Urine YELLOW YELLOW   APPearance HAZY (A) CLEAR   Specific Gravity, Urine 1.014 1.005 - 1.030   pH 5.0 5.0 - 8.0   Glucose, UA 150 (A) NEGATIVE mg/dL   Hgb urine dipstick  NEGATIVE NEGATIVE   Bilirubin Urine NEGATIVE NEGATIVE   Ketones, ur 5 (A) NEGATIVE mg/dL   Protein, ur NEGATIVE NEGATIVE mg/dL   Nitrite NEGATIVE NEGATIVE   Leukocytes,Ua MODERATE (A) NEGATIVE   RBC / HPF 0-5 0 - 5 RBC/hpf   WBC, UA 11-20 0 - 5 WBC/hpf   Bacteria, UA NONE SEEN NONE SEEN   Squamous Epithelial / LPF 0-5 0 - 5   Mucus PRESENT    Crystals PRESENT (A) NEGATIVE    Comment: Performed at [facility], [place] [address] [place],  [address]   DG Chest Port 1 View  Result Date: 06/11/2020 CLINICAL DATA:  Dyspnea EXAM: PORTABLE CHEST 1 VIEW COMPARISON:  [id number] FINDINGS: Minimal left basilar atelectasis. Lungs are otherwise clear. No pneumothorax or pleural effusion. Coronary artery bypass grafting has been performed. Cardiac size is within normal limits. Pulmonary vascularity is normal. No acute bone abnormality. IMPRESSION: No active disease. 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 Ok to have a diet from a surgical perspective. I do not think this needs debridement in the OR at this time. Ok to have chemical VTE prophylaxis from a surgical standpoint.   Norm Parcel, [facility] Surgery 06/11/2020, 5:00 PM Please see Amion for pager number during day hours 7:00am-4:30pm

## 2020-06-12 DIAGNOSIS — L89300 Pressure ulcer of unspecified buttock, unstageable: Secondary | ICD-10-CM | POA: Insufficient documentation

## 2020-06-12 DIAGNOSIS — N179 Acute kidney failure, unspecified: Secondary | ICD-10-CM

## 2020-06-12 LAB — BASIC METABOLIC PANEL
Anion gap: 13 (ref 5–15)
Anion gap: 15 (ref 5–15)
Anion gap: 14 (ref 5–15)
Anion gap: 13 (ref 5–15)
BUN: 104 mg/dL — ABNORMAL HIGH (ref 8–23)
BUN: 112 mg/dL — ABNORMAL HIGH (ref 8–23)
BUN: 108 mg/dL — ABNORMAL HIGH (ref 8–23)
BUN: 109 mg/dL — ABNORMAL HIGH (ref 8–23)
CO2: 16 mmol/L — ABNORMAL LOW (ref 22–32)
CO2: 15 mmol/L — ABNORMAL LOW (ref 22–32)
CO2: 14 mmol/L — ABNORMAL LOW (ref 22–32)
CO2: 14 mmol/L — ABNORMAL LOW (ref 22–32)
Calcium: 7.9 mg/dL — ABNORMAL LOW (ref 8.9–10.3)
Calcium: 8 mg/dL — ABNORMAL LOW (ref 8.9–10.3)
Calcium: 7.9 mg/dL — ABNORMAL LOW (ref 8.9–10.3)
Calcium: 7.8 mg/dL — ABNORMAL LOW (ref 8.9–10.3)
Chloride: 88 mmol/L — ABNORMAL LOW (ref 98–111)
Chloride: 88 mmol/L — ABNORMAL LOW (ref 98–111)
Chloride: 93 mmol/L — ABNORMAL LOW (ref 98–111)
Chloride: 95 mmol/L — ABNORMAL LOW (ref 98–111)
Creatinine, Ser: 5.02 mg/dL — ABNORMAL HIGH (ref 0.61–1.24)
Creatinine, Ser: 5.07 mg/dL — ABNORMAL HIGH (ref 0.61–1.24)
Creatinine, Ser: 5.04 mg/dL — ABNORMAL HIGH (ref 0.61–1.24)
Creatinine, Ser: 4.76 mg/dL — ABNORMAL HIGH (ref 0.61–1.24)
GFR calc Af Amer: 12 mL/min — ABNORMAL LOW (ref >60)
GFR calc Af Amer: 12 mL/min — ABNORMAL LOW (ref >60)
GFR calc Af Amer: 12 mL/min — ABNORMAL LOW (ref >60)
GFR calc Af Amer: 13 mL/min — ABNORMAL LOW (ref >60)
GFR calc non Af Amer: 10 mL/min — ABNORMAL LOW (ref >60)
GFR calc non Af Amer: 10 mL/min — ABNORMAL LOW (ref >60)
GFR calc non Af Amer: 10 mL/min — ABNORMAL LOW (ref >60)
GFR calc non Af Amer: 11 mL/min — ABNORMAL LOW (ref >60)
Glucose, Bld: 246 mg/dL — ABNORMAL HIGH (ref 70–99)
Glucose, Bld: 233 mg/dL — ABNORMAL HIGH (ref 70–99)
Glucose, Bld: 198 mg/dL — ABNORMAL HIGH (ref 70–99)
Glucose, Bld: 185 mg/dL — ABNORMAL HIGH (ref 70–99)
Potassium: 4.7 mmol/L (ref 3.5–5.1)
Potassium: 4.3 mmol/L (ref 3.5–5.1)
Potassium: 4.5 mmol/L (ref 3.5–5.1)
Potassium: 4.8 mmol/L (ref 3.5–5.1)
Sodium: 117 mmol/L — CL (ref 135–145)
Sodium: 118 mmol/L — CL (ref 135–145)
Sodium: 121 mmol/L — ABNORMAL LOW (ref 135–145)
Sodium: 122 mmol/L — ABNORMAL LOW (ref 135–145)

## 2020-06-12 LAB — GLUCOSE, CAPILLARY
Glucose-Capillary: 176 mg/dL — ABNORMAL HIGH (ref 70–99)
Glucose-Capillary: 180 mg/dL — ABNORMAL HIGH (ref 70–99)
Glucose-Capillary: 182 mg/dL — ABNORMAL HIGH (ref 70–99)
Glucose-Capillary: 198 mg/dL — ABNORMAL HIGH (ref 70–99)
Glucose-Capillary: 198 mg/dL — ABNORMAL HIGH (ref 70–99)
Glucose-Capillary: 200 mg/dL — ABNORMAL HIGH (ref 70–99)
Glucose-Capillary: 209 mg/dL — ABNORMAL HIGH (ref 70–99)
Glucose-Capillary: 212 mg/dL — ABNORMAL HIGH (ref 70–99)
Glucose-Capillary: 230 mg/dL — ABNORMAL HIGH (ref 70–99)
Glucose-Capillary: 233 mg/dL — ABNORMAL HIGH (ref 70–99)
Glucose-Capillary: 234 mg/dL — ABNORMAL HIGH (ref 70–99)
Glucose-Capillary: 235 mg/dL — ABNORMAL HIGH (ref 70–99)
Glucose-Capillary: 252 mg/dL — ABNORMAL HIGH (ref 70–99)

## 2020-06-12 LAB — APTT: aPTT: 28 seconds (ref 24–36)

## 2020-06-12 LAB — CBC
HCT: 17.8 % — ABNORMAL LOW (ref 39.0–52.0)
Hemoglobin: 6 g/dL — CL (ref 13.0–17.0)
MCH: 31.7 pg (ref 26.0–34.0)
MCHC: 33.7 g/dL (ref 30.0–36.0)
MCV: 94.2 fL (ref 80.0–100.0)
Platelets: 196 10*3/uL (ref 150–400)
RBC: 1.89 MIL/uL — ABNORMAL LOW (ref 4.22–5.81)
RDW: 12.9 % (ref 11.5–15.5)
WBC: 20.9 10*3/uL — ABNORMAL HIGH (ref 4.0–10.5)
nRBC: 0 % (ref 0.0–0.2)

## 2020-06-12 LAB — BLOOD GAS, ARTERIAL
Acid-base deficit: 11.1 mmol/L — ABNORMAL HIGH (ref 0.0–2.0)
Bicarbonate: 13.4 mmol/L — ABNORMAL LOW (ref 20.0–28.0)
O2 Saturation: 90 %
Patient temperature: 98.4
pCO2 arterial: 25.6 mmHg — ABNORMAL LOW (ref 32.0–48.0)
pH, Arterial: 7.34 — ABNORMAL LOW (ref 7.350–7.450)
pO2, Arterial: 62 mmHg — ABNORMAL LOW (ref 83.0–108.0)

## 2020-06-12 LAB — SODIUM: Sodium: 120 mmol/L — ABNORMAL LOW (ref 135–145)

## 2020-06-12 LAB — PROTIME-INR
INR: 1.2 (ref 0.8–1.2)
Prothrombin Time: 14.8 seconds (ref 11.4–15.2)

## 2020-06-12 LAB — MAGNESIUM: Magnesium: 1.9 mg/dL (ref 1.7–2.4)

## 2020-06-12 LAB — PHOSPHORUS: Phosphorus: 5.4 mg/dL — ABNORMAL HIGH (ref 2.5–4.6)

## 2020-06-12 LAB — HEMOGLOBIN AND HEMATOCRIT, BLOOD
HCT: 24.2 % — ABNORMAL LOW (ref 39.0–52.0)
Hemoglobin: 8.1 g/dL — ABNORMAL LOW (ref 13.0–17.0)

## 2020-06-12 LAB — PREPARE RBC (CROSSMATCH)

## 2020-06-12 LAB — LACTIC ACID, PLASMA: Lactic Acid, Venous: 0.9 mmol/L (ref 0.5–1.9)

## 2020-06-12 LAB — SODIUM, URINE, RANDOM: Sodium, Ur: <10 mmol/L

## 2020-06-12 LAB — CORTISOL-AM, BLOOD: Cortisol - AM: 30.2 ug/dL — ABNORMAL HIGH (ref 6.7–22.6)

## 2020-06-12 MED ORDER — INSULIN DETEMIR 100 UNIT/ML ~~LOC~~ SOLN
0.3000 [IU]/kg | SUBCUTANEOUS | Status: DC
  Administered 2020-06-12 – 2020-06-18 (×7): 23 [IU] via SUBCUTANEOUS
  Filled 2020-06-12 (×7): qty 0.23

## 2020-06-12 MED ORDER — ALBUMIN HUMAN 5 % IV SOLN
12.5000 g | Freq: Once | INTRAVENOUS | Status: AC
  Administered 2020-06-12: 12.5 g via INTRAVENOUS
  Filled 2020-06-12: qty 250

## 2020-06-12 MED ORDER — PHENYLEPHRINE HCL-NACL 10-0.9 MG/250ML-% IV SOLN
INTRAVENOUS | Status: AC
  Filled 2020-06-12: qty 250

## 2020-06-12 MED ORDER — HEPARIN SODIUM (PORCINE) 5000 UNIT/ML IJ SOLN
5000.0000 [IU] | Freq: Three times a day (TID) | INTRAMUSCULAR | Status: DC
  Administered 2020-06-12 – 2020-06-18 (×16): 5000 [IU] via SUBCUTANEOUS
  Filled 2020-06-12 (×17): qty 1

## 2020-06-12 MED ORDER — VASOPRESSIN 20 UNITS/100 ML INFUSION FOR SHOCK
0.0000 [IU]/min | INTRAVENOUS | Status: DC
  Administered 2020-06-12: 0.03 [IU]/min via INTRAVENOUS
  Filled 2020-06-12 (×2): qty 100

## 2020-06-12 MED ORDER — INSULIN ASPART 100 UNIT/ML ~~LOC~~ SOLN
0.0000 [IU] | Freq: Every day | SUBCUTANEOUS | Status: DC
  Administered 2020-06-17: 1 [IU] via SUBCUTANEOUS

## 2020-06-12 MED ORDER — SODIUM CHLORIDE 0.9% IV SOLUTION: Freq: Once | INTRAVENOUS | Status: AC

## 2020-06-12 MED ORDER — CHLORHEXIDINE GLUCONATE CLOTH 2 % EX PADS
6.0000 | MEDICATED_PAD | Freq: Every day | CUTANEOUS | Status: DC
  Administered 2020-06-12 – 2020-06-18 (×8): 6 via TOPICAL

## 2020-06-12 MED ORDER — SODIUM CHLORIDE 3 % IV SOLN
INTRAVENOUS | Status: DC
  Filled 2020-06-12: qty 500

## 2020-06-12 MED ORDER — INSULIN ASPART 100 UNIT/ML ~~LOC~~ SOLN
0.0000 [IU] | Freq: Three times a day (TID) | SUBCUTANEOUS | Status: DC
  Administered 2020-06-12 (×2): 2 [IU] via SUBCUTANEOUS
  Administered 2020-06-13: 1 [IU] via SUBCUTANEOUS
  Administered 2020-06-13 (×2): 2 [IU] via SUBCUTANEOUS
  Administered 2020-06-14 (×2): 1 [IU] via SUBCUTANEOUS
  Administered 2020-06-15 – 2020-06-16 (×2): 2 [IU] via SUBCUTANEOUS
  Administered 2020-06-17: 1 [IU] via SUBCUTANEOUS

## 2020-06-12 MED ORDER — ORAL CARE MOUTH RINSE
15.0000 mL | Freq: Two times a day (BID) | OROMUCOSAL | Status: DC
  Administered 2020-06-14 – 2020-06-18 (×5): 15 mL via OROMUCOSAL

## 2020-06-12 MED ORDER — INSULIN ASPART 100 UNIT/ML ~~LOC~~ SOLN
3.0000 [IU] | Freq: Three times a day (TID) | SUBCUTANEOUS | Status: DC
  Administered 2020-06-12 – 2020-06-18 (×13): 3 [IU] via SUBCUTANEOUS

## 2020-06-12 MED ORDER — CHLORHEXIDINE GLUCONATE 0.12 % MT SOLN: 15.0000 mL | Freq: Two times a day (BID) | OROMUCOSAL | Status: DC

## 2020-06-12 MED ORDER — LACTATED RINGERS IV SOLN: INTRAVENOUS | Status: DC

## 2020-06-12 MED ORDER — CHLORHEXIDINE GLUCONATE 0.12 % MT SOLN
15.0000 mL | Freq: Two times a day (BID) | OROMUCOSAL | Status: DC
  Administered 2020-06-12 – 2020-06-18 (×11): 15 mL via OROMUCOSAL
  Filled 2020-06-12 (×13): qty 15

## 2020-06-12 NOTE — Progress Notes (Signed)
[facility] Progress Note     Subjective: Seen with PT hydrotherapy. Patient does not have any pain in gluteal area just lying there. Weaning off pressors. Ex-wife at bedside.   Objective: Vital signs in last 24 hours: Temp:  [94.5 F (34.7 C)-98.8 F (37.1 C)] 98.3 F (36.8 C) (08/31 0954) Pulse Rate:  [43-78] 67 (08/31 1000) Resp:  [15-31] 19 (08/31 1000) BP: (75-130)/(22-114) 95/44 (08/31 0445) SpO2:  [88 %-96 %] 92 % (08/31 1000) Arterial Line BP: (51-296)/(11-286) 128/46 (08/31 1000) Weight:  [66.7 kg-77.8 kg] 77.8 kg (08/31 0500) Last BM Date: 06/11/20  Intake/Output from previous day: 08/30 0701 - 08/31 0700 In: 3667.9 [I.V.:1431.6; IV Piggyback:2136.4] Out: 100 [Urine:100] Intake/Output this shift: Total I/O In: 428.2 [I.V.:393.3; IV Piggyback:34.9] Out: -   PE: General: pleasant, chronically ill appearing male Heart: regular, rate, and rhythm. Palpable radial and pedal pulses bilaterally Lungs: Respiratory effort nonlabored Abd: soft, NT, ND GU: Gluteal wound with some worsening necrosis as seen below, no pockets of purulent material, wound undermines 5 cm inferiomedially        Lab Results:  Recent Labs    06/11/20 1434 06/12/20 0530  WBC 25.6* 20.9*  HGB 7.5* 6.0*  HCT 22.2* 17.8*  PLT 251 196   BMET Recent Labs    06/12/20 0042 06/12/20 0530  NA 117* 118*  K 4.7 4.3  CL 88* 88*  CO2 16* 15*  GLUCOSE 246* 233*  BUN 104* 112*  CREATININE 5.02* 5.07*  CALCIUM 7.9* 8.0*   PT/INR Recent Labs    06/11/20 1434 06/12/20 0656  LABPROT 14.2 14.8  INR 1.1 1.2   CMP     Component Value Date/Time   NA 118 (LL) 06/12/2020 0530   NA 141 08/18/2018 1529   K 4.3 06/12/2020 0530   CL 88 (L) 06/12/2020 0530   CO2 15 (L) 06/12/2020 0530   GLUCOSE 233 (H) 06/12/2020 0530   BUN 112 (H) 06/12/2020 0530   BUN 40 (H) 08/18/2018 1529   CREATININE 5.07 (H) 06/12/2020 0530   CREATININE 1.27 03/04/2018 1434   CREATININE 1.11  07/13/2013 0814   CALCIUM 8.0 (L) 06/12/2020 0530   PROT 6.2 (L) 06/11/2020 1434   PROT 6.9 10/08/2017 0820   ALBUMIN 2.4 (L) 06/11/2020 1816   ALBUMIN 4.4 10/08/2017 0820   AST 18 06/11/2020 1434   AST 22 03/04/2018 1434   ALT 7 06/11/2020 1434   ALT 6 03/04/2018 1434   ALKPHOS 91 06/11/2020 1434   BILITOT 0.9 06/11/2020 1434   BILITOT <0.2 (L) 03/04/2018 1434   GFRNONAA 10 (L) 06/12/2020 0530   GFRNONAA 54 (L) 03/04/2018 1434   GFRAA 12 (L) 06/12/2020 0530   GFRAA >60 03/04/2018 1434   Lipase     Component Value Date/Time   LIPASE 132 (H) 05/23/2011 0231       Studies/Results: CT ABDOMEN PELVIS WO CONTRAST  Addendum Date: 06/11/2020   ADDENDUM REPORT: 06/11/2020 21:43 ADDENDUM: Few borderline enlarged retroperitoneal lymph nodes, stable to decreased in size from comparison imaging. Electronically Signed   By: Lovena Le M.D.   On: 06/11/2020 21:43   Result Date: 06/11/2020 CLINICAL DATA:  Acute renal failure, metastatic prostate cancer and perianal abscess EXAM: CT ABDOMEN AND PELVIS WITHOUT CONTRAST TECHNIQUE: Multidetector CT imaging of the abdomen and pelvis was performed following the standard protocol without IV contrast. COMPARISON:  Wide for abscess cysts cough price FINDINGS: Lower chest: Small bilateral effusions. Adjacent areas of passive atelectasis. Underlying consolidation not excluded.  Normal cardiac size. Pacer leads appear directed towards the coronary sinus. Coronary artery atherosclerosis is noted as well. Evidence of prior sternotomy and CABG. Hypoattenuation of the cardiac blood pool suggestive of anemia. Hepatobiliary: Borderline hepatomegaly similar to comparison. Smooth liver surface contour. No visible focal liver lesion on this unenhanced CT. Post cholecystectomy. No significant biliary ductal dilatation or visible intraductal gallstones. Pancreas: Stable atrophy of the pancreatic body and tail. No pancreatic ductal dilatation or surrounding inflammatory  changes. Spleen: Normal in size. No concerning splenic lesions. Small accessory splenule. Adrenals/Urinary Tract: Lobular thickening of the adrenal glands, similar to comparison studies, without discernible focal adrenal lesion. Moderate bilateral perinephric stranding, overall similar to comparison CT from August 2021. Bilateral renal hilar calcifications are likely vascular in nature without discernible urolithiasis or hydronephrosis. Bladder decompressed by inflated Foley catheter balloon albeit with some mild wall thickening and perivesicular hazy stranding, nonspecific in the setting of instrumentation. Stomach/Bowel: Distal esophagus, stomach and duodenal sweep are unremarkable. No small bowel wall thickening or dilatation. No evidence of obstruction. Appendix is not visualized. Normal caliber appendix is present though does course in the vicinity of adjacent free fluid in the right pericolic gutter. Question some mild mural thickening of the colon versus underdistention. Perianal gas and possible fistulous connection, as detailed below. Vascular/Lymphatic: Atherosclerotic calcifications within the abdominal aorta and branch vessels. No aneurysm or ectasia. Stable appearance of a borderline enlarged right retrocaval lymph node measuring 1.1 cm (2/35) diminished conspicuity of a right external iliac lymph node now measuring only 7 mm in size, previously 0.9 cm (2/69) stable appearance of a separate borderline enlarged right external iliac node measuring up to 1.1 cm short axis (2/80). No other concerning adenopathy. Reproductive: Mild prostatomegaly, traversed by the inflated Foley catheter balloon. Lobular thickening of the seminal vesicles is a nonspecific finding. No acute abnormality of the external genitalia. Soft tissue gas and stranding extends to the base of the left penile corpora and spongiosum. Other: Soft tissue thickening and perianal abscess with soft tissue defect of the left ischioanal fossa  (2/97. A tract of gas lucencies extends from the soft tissue thickening seen just laterally to the base of the left penile corpora cavernosum and spongiosum anteriorly and to the margin of the thickened levator plate with a small amount of intersphincteric low-attenuation fluid and gas seen on coronal imaging (4/90). Small volume of low-attenuation free fluid seen layering in the pericolic gutters and pelvis, nonspecific. Moderate body wall edema. Musculoskeletal: Multilevel sclerotic lesions present throughout the included thoracolumbar spine and bony pelvis. Overall extent and distribution similar to comparison. Multilevel degenerative changes in the spine hips and pelvis as well as the left wrist, partially included within the level of imaging. IMPRESSION: 1. Soft tissue thickening and perianal ulcer with soft tissue defect of the left ischioanal fossa. 2. A tract of gas lucency is seen extending from the region of skin thickening and defect lateral to the ulcer base anteromedially to the left penile corpora cavernosum and spongiosum anteriorly and to the margin of the thickened left levator plate with a small amount of intersphincteric low-attenuation fluid and gas, concerning fistulous connection. A discrete abscess/collection is not discernible at this time. 3. Question some mild mural thickening of the colon versus underdistention of the cecum. Correlate with clinical symptoms and consider direct visualization. 4. Small volume of low-attenuation free fluid seen layering in the pericolic gutters and pelvis, nonspecific. 5. Small bilateral effusions with adjacent areas of passive atelectasis. Underlying consolidation not excluded. 6. Moderate body  wall edema. Correlate for features of anasarca/volume third-spacing. 7. Multilevel sclerotic lesions throughout the included thoracolumbar spine and bony pelvis, consistent with metastatic disease. 8. Mild prostatomegaly and lobular thickening of the seminal vesicles.  May correlate with history of patient's prostate cancer. 9. Moderate bilateral perinephric stranding, overall similar to comparison CT from August 2021. Findings could reflect senescent change, however, if urinary symptoms are present recommend correlation with urinalysis to exclude infection. 10. Aortic Atherosclerosis (ICD10-I70.0). Electronically Signed: By: Lovena Le M.D. On: 06/11/2020 20:55   DG Chest Port 1 View  Result Date: 06/11/2020 CLINICAL DATA:  Central line placement EXAM: PORTABLE CHEST 1 VIEW COMPARISON:  Radiograph 06/11/2020, CT 06/06/2020 FINDINGS: Right IJ catheter tip terminates at the level of the right atrium. Prior sternotomy and features of CABG. Stable cardiomediastinal contours. Atelectatic changes and vascular crowding are similar to comparison. Some abundant mediastinal and subpleural fat is similar to comparison CT. No convincing pneumothorax or effusion. No new focal consolidative opacity or features of edema. No acute osseous or soft tissue abnormality. Telemetry leads overlie the chest. Surgical clips at the base of the neck and evidence of left carotid stenting. IMPRESSION: 1. Right IJ catheter tip terminates at the level of the right atrium. 2. No convincing pneumothorax. 3. Stable bilateral atelectasis and vascular crowding. Electronically Signed   By: Lovena Le M.D.   On: 06/11/2020 20:05   DG Chest Port 1 View  Result Date: 06/11/2020 CLINICAL DATA:  Dyspnea EXAM: PORTABLE CHEST 1 VIEW COMPARISON:  [id number] FINDINGS: Minimal left basilar atelectasis. Lungs are otherwise clear. No pneumothorax or pleural effusion. Coronary artery bypass grafting has been performed. Cardiac size is within normal limits. Pulmonary vascularity is normal. No acute bone abnormality. IMPRESSION: No active disease. Electronically Signed   By: Fidela Salisbury MD   On: 06/11/2020 16:37    Anti-infectives: Anti-infectives (From admission, onward)   Start     Dose/Rate Route Frequency  Ordered Stop   06/12/20 0400  ceFEPIme (MAXIPIME) 1 g in sodium chloride 0.9 % 100 mL IVPB        1 g 200 mL/hr over 30 Minutes Intravenous Every 24 hours 06/11/20 1954     06/11/20 2200  metroNIDAZOLE (FLAGYL) IVPB 500 mg        500 mg 100 mL/hr over 60 Minutes Intravenous Every 8 hours 06/11/20 1942     06/11/20 1952  vancomycin variable dose per unstable renal function (pharmacist dosing)         Does not apply See admin instructions 06/11/20 1952     06/11/20 1800  vancomycin (VANCOREADY) IVPB 1500 mg/300 mL        1,500 mg 150 mL/hr over 120 Minutes Intravenous  Once 06/11/20 1735 06/11/20 2207   06/11/20 1545  piperacillin-tazobactam (ZOSYN) IVPB 3.375 g        3.375 g 100 mL/hr over 30 Minutes Intravenous  Once 06/11/20 1542 06/11/20 1728       Assessment/Plan CVA with left sided weakness Hx of MI s/p CABGx4 hemiparkinsonism on the right HTN HLD T2DM - on insulin gtt Hx of seizure disorder  Septic shock - WBC 20 from 25, has weaned off vaso, still on levo - UA significant for leukocytes and patient with chronic indwelling catheter, culture sent - wife also reports increased purulence around foley today  L gluteal wound  - CT overnight shows wound, no undrained collection, perinephric stranding - culture pending  - continue hydrotherapy - wound appears almost more like a pressure injury  rather than an abscess, I do not think this is source of septic shock - will re-evaluate the wound Thursday this week    LOS: 1 day    Norm Parcel , [facility] 06/12/2020, 10:15 AM Please see Amion for pager number during day hours 7:00am-4:30pm

## 2020-06-12 NOTE — Progress Notes (Signed)
   06/12/20 1600  Physical Therapy hydrotherapy Evaluation 1400-1445  Subjective Assessment  Subjective Do what you need to do  Patient and Family Stated Goals to get better  Date of Onset  (present on admit)  Evaluation and Treatment  Evaluation and Treatment Procedures Explained to Patient/Family Yes  Evaluation and Treatment Procedures agreed to  Pressure Injury 06/12/20 Rectum Left Unstageable - Full thickness tissue loss in which the base of the injury is covered by slough (yellow, tan, gray, green or brown) and/or eschar (tan, brown or black) in the wound bed. PT only- perirectal open wound wi  Date First Assessed/Time First Assessed: 06/12/20 1406   Location: Rectum  Location Orientation: Left  Staging: Unstageable - Full thickness tissue loss in which the base of the injury is covered by slough (yellow, tan, gray, green or brown) and/or es...  Dressing Type Foam - Lift dressing to assess site every shift  Dressing Change Frequency Twice a day  Site / Wound Assessment Friable  % Wound base Red or Granulating 0%  % Wound base Black/Eschar 100%  Peri-wound Assessment Intact;Excoriated;Maceration  Wound Length (cm) 4.5 cm  Wound Width (cm) 3.5 cm  Wound Depth (cm) 2 cm  Wound Surface Area (cm^2) 15.75 cm^2  Wound Volume (cm^3) 31.5 cm^3  Tunneling (cm) 4.5  Undermining (cm) 2 (at 9 to 12)  Margins Unattached edges (unapproximated)  Drainage Amount Minimal  Drainage Description Sanguineous  Treatment Hydrotherapy (Pulse lavage)  Hydrotherapy  Pulsed lavage therapy - wound location left of rectum  Pulsed Lavage with Suction (psi) 4 psi  Pulsed Lavage with Suction - Normal Saline Used 1000 mL  Pulsed Lavage Tip Tip with splash shield  Wound Therapy - Assess/Plan/Recommendations  Wound Therapy - Clinical Statement patient has open wound  left of rectum with gray slough, has a tunneling at 5:00 . The patient tolerated well. Patient's primary caregiver reports that patient was  ambulatory with assistance and AFO on the Left ankle up until 1 week ago.  Patient has multiple medical issues ongoing. Patient will  benefit from hydrotherapy to promote  wound healing and  minimize  effects of bioburden. continue PT  for hydrotherapy.  Wound Therapy - Functional Problem List requires caregivers to assist with ambulation and ADL's.  Patient has left side weakness form previous strokes.  Factors Delaying/Impairing Wound Healing Diabetes Mellitus;Infection - systemic/local;Multiple medical problems  Hydrotherapy Plan Debridement;Patient/family education;Pulsatile lavage with suction  Wound Therapy - Frequency 6X / week  Wound Therapy - Current Recommendations PT  Wound Therapy - Follow Up Recommendations Skilled nursing facility  Wound Plan hydrotherapy  Wound Therapy Goals - Improve the function of patient's integumentary system by progressing the wound(s) through the phases of wound healing by:  Decrease Necrotic Tissue to 50  Decrease Necrotic Tissue - Progress Goal set today  Increase Granulation Tissue to 50  Increase Granulation Tissue - Progress Goal set today  Improve Drainage Characteristics Min  Improve Drainage Characteristics - Progress Goal set today  Additional Wound Therapy Goal reposition off left gluteal area  Additional Wound Therapy Goal - Progress Goal set today  Time For Goal Achievement 2 weeks  Wound Therapy - Potential for Goals [place] Pager [phone] Office [phone]

## 2020-06-12 NOTE — Consult Note (Addendum)
WOC Nurse Consult Note: Reason for Consult: Consult requested for Unstageable pressure injuries to left buttock and scrotum wound. Refer to photos in the EMR. Pressure Injury POA: Yes Pt is already being followed by the surgical team for this location; refer to their consult note from 8/30.  They debrided the Unstageable wound and have ordered moist gauze packing to be performed by the bedside nurses and hydrotherapy to be performed by the physical therapy team. Please refer to their team for further questions regarding plan of care.  Please re-consult if further assistance is needed.  Thank-you,  Julien Girt MSN, [place], [place], [place], [place]

## 2020-06-12 NOTE — Progress Notes (Signed)
NAME:  [name], MRN:  [id number], DOB:  [birth year], LOS: 1 ADMISSION DATE:  06/11/2020, CONSULTATION DATE:  06/11/2020 REFERRING MD:  Jaquita Rector doc, CHIEF COMPLAINT:  Sepsis, AKI   Brief History   74 year old gentleman with perirectal abscess Not been feeling well for a whole week Wound started draining a couple of days ago-cottage cheese Fevers up to 101.6 at home Blood sugar over 300  History of present illness   Metastatic prostate cancer with metastasis to the bone-new diagnosis that was just found recently Chronic indwelling Foley Past Medical History   Past Medical History:  Diagnosis Date  . At high risk for falls   . Bladder cancer [facility])    urologist-  dr Gaynelle Arabian  . CAD (coronary artery disease) cardiologist-  dr berry  . Cerebrovascular arteriosclerosis   . Gait disturbance, post-stroke    uses cane, rollator, and w/c long distance  . Hemiparkinsonism [facility]) neurologist-- dr Leta Baptist   right side body  . History of basal cell carcinoma excision    Sept 2016--  MOH's surgery side of nose  . History of carotid artery stenosis cardiologist-- dr berry   bilateral --  1999 s/p  bilateral ICA endarterectomy and recurrent restenosis 2012  s/p  staged bilateral stenting   per last duplex 2015  stents widely patent  . History of CVA with residual deficit neurologist-  dr Leta Baptist   3/ 1999  Right MCA  and  12/ 2004  Anterior division of  Right MCA ---  residual left spactic hemiparesis and left foot drop  . History of MI (myocardial infarction)    02/ 2004   s/p  cabg   . HOH (hard of hearing)    LEFT EAR  POST cva  . Hyperextension deformity of left knee    wears brace  . Hyperlipidemia   . Hypertension   . Left foot drop    residual from CVA  -- wears brace  . Left spastic hemiparesis ([place])    residual CVA 1999  . Pancreatic pseudocyst   . Peripheral arterial disease ([place])   . S/P CABG x 4    02/ 2004  . Seizure disorder [facility]) last seizure 2011 due to confusion    neurologist-  dr Leta Baptist-- per note seizure documented 2008 breakthrough partial complex seizure (prior tonic-clonic seizure's post CVA)  . Type 2 diabetes, diet controlled ([place])     2  . Visual neglect    LEFT EYE   Significant Hospital Events   Low blood pressure 8/31- awake and interactive, still requiring pressors  Consults:  Renal pccm  Procedures:  None 8/30 central line placement  Significant Diagnostic Tests:   chest x-ray 8/30-no acute infiltrate  Micro Data:  Blood cultures x2 Urine cultures   Antimicrobials:  Zosyn 8/30>> Vancomycin 8/30>> Flagyl 8/30>>  Interim history/subjective:  Chronically ill-appearing Hypotensive Feels generally better this morning  Objective   Blood pressure (!) 95/44, pulse 69, temperature 98.3 F (36.8 C), temperature source Oral, resp. rate (!) 21, height 5\' 4"  (1.626 m), weight 77.8 kg, SpO2 92 %. CVP:  [8 mmHg-27 mmHg] 27 mmHg      Intake/Output Summary (Last 24 hours) at 06/12/2020 0741 Last data filed at 06/12/2020 0705 Gross per 24 hour  Intake 3710.2 ml  Output 100 ml  Net 3610.2 ml   Filed Weights   06/11/20 1512 06/12/20 0500  Weight: 66.7 kg 77.8 kg    Examination: General: Chronically ill-appearing gentleman, very frail HENT: Dry oral  mucosa Lungs: Clear bilaterally, few rales at the bases Cardiovascular: S1-S2 appreciated Abdomen: Bowel sounds appreciated Extremities: No clubbing, no edema Neuro: Alert and oriented x3 GU: Poor output Unstageable ulcer of sacrum  Resolved Hospital Problem list     Assessment & Plan:  Septic shock -Fluid resuscitation -Continue pressors -Continue antibiotics vancomycin, cefepime, Flagyl -Follow cultures -Continue CVP monitoring -Leukocytosis is better  DKA -Continue individual -Anion gap fluctuating but better  Acute kidney injury -Multifactorial -Likely prerenal with some component of ATN -He has been fluid resuscitated -Continue pressors to  maintain MAP greater than 65 to maintain renal perfusion  Hyponatremia -Being corrected -Continue to monitor closely  Metastatic prostate cancer -Extensive metastases to bones -Very advanced disease -Plan to involve palliative care  Buttock abscess -Unstageable ulcer -This may be the source of the sepsis -Continue antibiotics at present -Wound management consult  Anemia -Blood transfusion pending  Heart failure -Diastolic dysfunction in November 2020 echo -We will need repeat echocardiogram -BNP was over thousand   History of CVA -Residual weakness on the left side  Parkinson's disease  History of CABG  Seizure disorder  Multiple comorbidities will make him a poor dialysis candidate  Best practice:  Diet: Advance diet as tolerated Pain/Anxiety/Delirium protocol (if indicated): Will monitor VAP protocol (if indicated): Not indicated DVT prophylaxis: Heparin GI prophylaxis: We will reassess Glucose control: Endo tool Mobility: Bedrest Code Status: Full code Family Communication: Ex-wife at bedside Disposition: ICU  Labs   CBC: Recent Labs  Lab 06/11/20 1434 06/12/20 0530  WBC 25.6* 20.9*  NEUTROABS 23.1*  --   HGB 7.5* 6.0*  HCT 22.2* 17.8*  MCV 94.9 94.2  PLT 251 814    Basic Metabolic Panel: Recent Labs  Lab 06/11/20 1434 06/11/20 1816 06/12/20 0042 06/12/20 0530  NA 111* 113* 117* 118*  K 5.2* 5.2* 4.7 4.3  CL 81* 80* 88* 88*  CO2 13* 11* 16* 15*  GLUCOSE 470* 440* 246* 233*  BUN 125* 120* 104* 112*  CREATININE 5.02* 5.26* 5.02* 5.07*  CALCIUM 8.1* 8.2* 7.9* 8.0*  MG  --   --   --  1.9  PHOS  --  6.4*  --  5.4*   GFR: Estimated Creatinine Clearance: 11.7 mL/min (A) (by C-G formula based on SCr of 5.07 mg/dL (H)). Recent Labs  Lab 06/11/20 1346 06/11/20 1434 06/12/20 0042 06/12/20 0530  WBC  --  25.6*  --  20.9*  LATICACIDVEN 1.3  --  0.9  --     Liver Function Tests: Recent Labs  Lab 06/11/20 1434 06/11/20 1816  AST 18   --   ALT 7  --   ALKPHOS 91  --   BILITOT 0.9  --   PROT 6.2*  --   ALBUMIN 2.8* 2.4*   No results for input(s): LIPASE, AMYLASE in the last 168 hours. No results for input(s): AMMONIA in the last 168 hours.  ABG    Component Value Date/Time   PHART 7.340 (L) 06/12/2020 0518   PCO2ART 25.6 (L) 06/12/2020 0518   PO2ART 62.0 (L) 06/12/2020 0518   HCO3 13.4 (L) 06/12/2020 0518   TCO2 25 10/29/2015 0749   ACIDBASEDEF 11.1 (H) 06/12/2020 0518   O2SAT 90.0 06/12/2020 0518     Coagulation Profile: Recent Labs  Lab 06/11/20 1434 06/12/20 0656  INR 1.1 1.2    Cardiac Enzymes: No results for input(s): CKTOTAL, CKMB, CKMBINDEX, TROPONINI in the last 168 hours.  HbA1C: Hgb A1c MFr Bld  Date/Time Value Ref Range  Status  08/27/2019 02:57 AM 7.5 (H) 4.8 - 5.6 % Final    Comment:    (NOTE) Pre diabetes:          5.7%-6.4% Diabetes:              >6.4% Glycemic control for   <7.0% adults with diabetes   03/23/2017 12:17 PM 6.4 (H) 4.8 - 5.6 % Final    Comment:    (NOTE)         Pre-diabetes: 5.7 - 6.4         Diabetes: >6.4         Glycemic control for adults with diabetes: <7.0     CBG: Recent Labs  Lab 06/11/20 1747 06/11/20 1934 06/11/20 2047 06/11/20 2143 06/11/20 2313  GLUCAP 445* 390* 374* 347* 252*    Review of Systems:   Very frail  Past Medical History  He,  has a past medical history of At high risk for falls, Bladder cancer ([place]), CAD (coronary artery disease) (cardiologist-  dr berry), Cerebrovascular arteriosclerosis, Gait disturbance, post-stroke, Hemiparkinsonism ([place]) (neurologist-- dr Leta Baptist), History of basal cell carcinoma excision, History of carotid artery stenosis (cardiologist-- dr berry), History of CVA with residual deficit (neurologist-  dr Leta Baptist), History of MI (myocardial infarction), HOH (hard of hearing), Hyperextension deformity of left knee, Hyperlipidemia, Hypertension, Left foot drop, Left spastic hemiparesis ([place]),  Pancreatic pseudocyst, Peripheral arterial disease ([place]), S/P CABG x 4, Seizure disorder ([place]) (last seizure 2011 due to confusion), Type 2 diabetes, diet controlled ([place]), and Visual neglect.   Surgical History    Past Surgical History:  Procedure Laterality Date  . CARDIAC CATHETERIZATION  11/17/2002   significant left main disease and 3 vessel CAD, mildly depressed LV systolic  fx, 40% left renal artery stenosis  . CAROTID ENDARTERECTOMY  12/1997   [facility])   Bilateral ICA  . CAROTID STENT INSERTION Bilateral dr berry and dr Kristine Royal--  (In-stent Restenosis)  LeftICA   02-12-2011;  [place]   02-26-2011  . CORONARY ARTERY BYPASS GRAFT  2/ 2004   dr hendrickson   x3 LIMA to LAD,free right internal mammary artery to obtuse marginal 1,SVG to distal right coronary.  . CYSTOSCOPY W/ RETROGRADES Bilateral 09/10/2015   Procedure: CYSTOSCOPY WITH RETROGRADE PYELOGRAM;  Surgeon: Carolan Clines, MD;  Location: [facility];  Service: Urology;  Laterality: Bilateral;  . CYSTOSCOPY WITH BIOPSY N/A 09/10/2015   Procedure: CYSTOSCOPY WITH BIOPSY,RIGHT TRIGONAL TUMOR 1.5 CM, EXCISIONAL BIOPSY WITH TAUBER FORCEP RIGHT POSTERIOR BLADDER WALL 1 CM, EXCISIONAL BIOPSY SATELITE BLADDER WALL 1 CM;  Surgeon: Carolan Clines, MD;  Location: [facility];  Service: Urology;  Laterality: N/A;  . CYSTOSCOPY WITH BIOPSY N/A 03/26/2017   Procedure: CYSTOSCOPY WITH BIOPSY/;  Surgeon: Carolan Clines, MD;  Location: [facility];  Service: Urology;  Laterality: N/A;  . CYSTOSCOPY WITH HYDRODISTENSION AND BIOPSY N/A 10/29/2015   Procedure: REPEAT CYSTOSCOPY BIOPSY BLADDER DEEP MUSCLES BLADDER BIOPSY;  Surgeon: Carolan Clines, MD;  Location: [facility];  Service: Urology;  Laterality: N/A;  . CYSTOSCOPY WITH URETHRAL DILATATION N/A 09/10/2015   Procedure: CYSTOSCOPY WITH URETHRAL DILATATION;  Surgeon: Carolan Clines, MD;  Location: [facility];   Service: Urology;  Laterality: N/A;  . FULGURATION OF BLADDER TUMOR N/A 03/26/2017   Procedure: FULGURATION OF BLADDER TUMOR;  Surgeon: Carolan Clines, MD;  Location: [facility];  Service: Urology;  Laterality: N/A;  . [place]  . MOHS SURGERY  06-20-2015  side of nose  . NM MYOCAR PERF WALL MOTION  01/09/2011   dr berry   mild to mod. perfusion defect in the basal inferolateral & mid inferolaterl regions consistant with an infarct/scar, no signigicant ischemia demonstracted/  Low Risk scan, no sig. change from previous study/  normal LV function and wall motion , ef 63%  . ORIF RIGHT ANKLE FX  1998   hardware retained  . TRANSURETHRAL RESECTION OF BLADDER TUMOR N/A 09/10/2015   Procedure: CYSTO TRANSURETHRAL RESECTION OF BLADDER TUMOR (TURBT) OF LEFT POSTERIOR BLADDER TUMOR 2 CM;  Surgeon: Carolan Clines, MD;  Location: [facility];  Service: Urology;  Laterality: N/A;  . TRANSURETHRAL RESECTION OF BLADDER TUMOR Left 04/17/2016   Procedure: TRANSURETHRAL RESECTION OF BLADDER TUMOR (TURBT);  Surgeon: Carolan Clines, MD;  Location[facility];  Service: Urology;  Laterality: Left;  . US ECHOCARDIOGRAPHY  01/03/2011   normal LVF, ef>55%/  mild LAE/ trace MR and TR,  mild AV sclerosis without stenosis     Social History   reports that he quit smoking about 12 years ago. His smoking use included cigarettes. He quit after 5.00 years of use. He has never used smokeless tobacco. He reports that he does not drink alcohol and does not use drugs.   Family History   His family history includes Diabetes in his brother; Heart attack in his father; Hyperlipidemia in his brother and sister; Hypertension in his brother and sister; Stroke in his father and mother.   Allergies Allergies  Allergen Reactions  . Altace [Ramipril] Cough    The patient is critically ill with multiple organ systems failure and requires high complexity decision making for assessment and  support, frequent evaluation and titration of therapies, application of advanced monitoring technologies and extensive interpretation of multiple databases. Critical Care Time devoted to patient care services described in this note independent of APP/resident time (if applicable)  is 32 minutes.   Sherrilyn Rist MD [facility] Personal pager: [phone] If unanswered, please page CCM On-call: [phone]

## 2020-06-12 NOTE — Plan of Care (Signed)
Discussed with patient in front of care giver plan of cate for the evening, pain management and putting lotion on feet with some teach back displayed

## 2020-06-12 NOTE — TOC Initial Note (Signed)
Transition of Care [facility]) - Initial/Assessment Note    Patient Details  Name: [name] MRN: [id number] Date of Birth: 04-15-1946  Transition of Care [facility]) CM/SW Contact:    [name], RN Phone Number: 06/12/2020, 7:53 AM  Clinical Narrative:                 Sepsis,Aterial lines [place] at 3l/min, Na-118, BUN 112 creat 5.07, wcb 20.0 HGB-6.0/ Ib abx x2, d5lr at 125cc/hr, iv insulin, in levophed iv neo. Ns 3% at 45ml/min. 74 year old gentleman with perirectal abscess Not been feeling well for a whole week Wound started draining a couple of days ago-cottage cheese Fevers up to 101.6 at home Blood sugar over 300 Expected Discharge Plan: Home/Self Care Barriers to Discharge: Continued Medical Work up   Patient Goals and CMS Choice Patient states their goals for this hospitalization and ongoing recovery are:: to go home CMS Medicare.gov Compare Post Acute Care list provided to:: Patient    Expected Discharge Plan and Services Expected Discharge Plan: Home/Self Care   Discharge Planning Services: CM Consult   Living arrangements for the past 2 months: Single Family Home                                      Prior Living Arrangements/Services Living arrangements for the past 2 months: Single Family Home Lives with:: Self Patient language and need for interpreter reviewed:: Yes Do you feel safe going back to the place where you live?: Yes      Need for Family Participation in Patient Care: Yes (Comment) Care giver support system in place?: Yes (comment)   Criminal Activity/Legal Involvement Pertinent to Current Situation/Hospitalization: No - Comment as needed  Activities of Daily Living Home Assistive Devices/Equipment: CBG Meter, Eyeglasses, Walker (specify type), Wheelchair ADL Screening (condition at time of admission) Patient's cognitive ability adequate to safely complete daily activities?: Yes Is the patient deaf or have difficulty hearing?: Yes Does  the patient have difficulty seeing, even when wearing glasses/contacts?: Yes Does the patient have difficulty concentrating, remembering, or making decisions?: No Patient able to express need for assistance with ADLs?: Yes Does the patient have difficulty dressing or bathing?: Yes Independently performs ADLs?: No Communication: Independent Dressing (OT): Needs assistance Is this a change from baseline?: Pre-admission baseline Grooming: Needs assistance Is this a change from baseline?: Pre-admission baseline Feeding: Needs assistance Is this a change from baseline?: Pre-admission baseline Bathing: Needs assistance Is this a change from baseline?: Pre-admission baseline Toileting: Needs assistance Is this a change from baseline?: Pre-admission baseline In/Out Bed: Needs assistance Is this a change from baseline?: Pre-admission baseline Walks in Home: Needs assistance Is this a change from baseline?: Pre-admission baseline Does the patient have difficulty walking or climbing stairs?: Yes Weakness of Legs: Both Weakness of Arms/Hands: Left  Permission Sought/Granted                  Emotional Assessment Appearance:: Appears stated age Attitude/Demeanor/Rapport: Engaged Affect (typically observed): Calm Orientation: : Oriented to Self, Oriented to Place, Oriented to  Time, Oriented to Situation Alcohol / Substance Use: Not Applicable Psych Involvement: No (comment)  Admission diagnosis:  Perirectal abscess [K61.1] Hyponatremia [E87.1] Prostate cancer ([place]) [C61] Encounter for central line placement [Z45.2] AKI (acute kidney injury) ([place]) [N17.9] Sepsis ([place]) [A41.9] Hypotension, unspecified hypotension type [I95.9] Patient Active Problem List   Diagnosis Date Noted  . History of bladder cancer  08/26/2019  . Abscess of buttock 08/26/2019  . Sepsis ([place]) 08/26/2019  . Hyponatremia 08/26/2019  . Normocytic anemia 08/26/2019  . Hemoptysis 08/26/2019  . Hip pain, left  08/26/2019  . CHF exacerbation ([place]) 08/26/2019  . CRI (chronic renal insufficiency), stage 3 (moderate) 04/21/2019  . History of stroke 08/18/2018  . Essential hypertension 09/25/2015  . Essential tremor 04/19/2015  . Parkinson's disease ([place]) 06/28/2014  . Hx of CABG 03/24/2014  . Dyslipidemia, goal LDL below 70 03/24/2014  . Seizure disorder ([place]) 03/08/2013  . Spastic hemiplegia affecting nondominant side ([place]) 03/29/2012  . Carotid stenosis 10/20/2011   PCP:  [name], MD Pharmacy:   [facility] [address], [place] [facility] [address] [place] [address] Phone: [phone] Fax: [phone]  [facility] [address], [place] [address] [place] [place] [address] Phone: [phone] Fax: [phone]     Social Determinants of Health (SDOH) Interventions    Readmission Risk Interventions No flowsheet data found.

## 2020-06-12 NOTE — Progress Notes (Signed)
Per Endotool, parameters had been met around 1000, to transition pt to SQ insulin. Dr. Ander Slade notified by this RN, and new orders were received. Pt was given SQ Levemir at 1146, and insulin gtt was stopped two hours after, per protocol. This RN will continue to carefully monitor pt.

## 2020-06-12 NOTE — Progress Notes (Signed)
[facility] Progress Note Patient Name: RESEAN BRANDER DOB: Dec 14, 1945 MRN: [id number]   Date of Service  06/12/2020  HPI/Events of Note  Hyponatremia with serum sodium of 117  eICU Interventions  Check a.m. cortisol to screen for adrenal insufficiency,  hyponatremia protocol with 3 % saline.        Kerry Kass Parris Signer 06/12/2020, 2:26 AM

## 2020-06-12 NOTE — Progress Notes (Signed)
[facility] Progress Note Patient Name: [name] DOB: [birth year] MRN: [id number]   Date of Service  06/12/2020  HPI/Events of Note  Patient needs an order to continue Foley catheter, BP still soft; 112/31, MAP 52  eICU Interventions  Order to continue foley entered, RT to re-insert arterial line for closer BP moniforing, Vasopressin ordered.     Intervention Category Major Interventions: Hypotension - evaluation and management Minor Interventions: Other:  Frederik Pear 06/12/2020, 3:49 AM

## 2020-06-12 NOTE — Progress Notes (Signed)
Admit: 06/11/2020 LOS: 1  72M metastatic prostate DCa, admit with anorexia/N/V, perirectal abscess, aKI, hyponatremia  Subjective:  . In ICU, on NE gtt . NS at 60mL/h . UNa < 10 . SNa 111 --> 121  . Glucose 470 to 198 . Ex Wife at bedside, very attentive and informed about his care /status . SCr still around 5, K 4.5, HCO3 14 and AG 14  Discussed GOC / big picture with ex-wife, she is aware of big picture.    08/30 0701 - 08/31 0700 In: 3667.9 [I.V.:1431.6; IV Piggyback:2136.4] Out: 100 [Urine:100]  Filed Weights   06/11/20 1512 06/12/20 0500  Weight: 66.7 kg 77.8 kg    Scheduled Meds: . carbidopa-levodopa  2 tablet Oral TID  . chlorhexidine  15 mL Mouth Rinse BID  . Chlorhexidine Gluconate Cloth  6 each Topical Daily  . dantrolene  50 mg Oral BID  . dipyridamole-aspirin  1 capsule Oral BID  . heparin  5,000 Units Subcutaneous Q8H  . insulin aspart  0-5 Units Subcutaneous QHS  . insulin aspart  0-9 Units Subcutaneous TID WC  . insulin aspart  3 Units Subcutaneous TID WC  . insulin detemir  0.3 Units/kg Subcutaneous Q24H  . levETIRAcetam  500 mg Oral Daily  . mouth rinse  15 mL Mouth Rinse q12n4p  . vancomycin variable dose per unstable renal function (pharmacist dosing)   Does not apply See admin instructions   Continuous Infusions: . sodium chloride Stopped (06/12/20 0635)  . sodium chloride Stopped (06/12/20 0108)  . ceFEPime (MAXIPIME) IV Stopped (06/12/20 0710)  . insulin Stopped (06/12/20 1354)  . lactated ringers 75 mL/hr at 06/12/20 1358  . metronidazole 500 mg (06/12/20 1441)  . norepinephrine (LEVOPHED) Adult infusion 5 mcg/min (06/12/20 1446)  . vasopressin Stopped (06/12/20 1145)   PRN Meds:.dextrose, docusate sodium, polyethylene glycol  Current Labs: reviewed    Physical Exam:  Blood pressure (!) 95/44, pulse 64, temperature 98.3 F (36.8 C), temperature source Oral, resp. rate 19, height 5\' 4"  (1.626 m), weight 77.8 kg, SpO2 93 %. Chronically ill  appearing, L sided paresis in face, UEs Sacral ulcer present RRR CTAB Soft Trace LEE  A 1. AKI; likely pre-renal hypovolemia with N/V/poor PO + ARB; 8/30 CT w/o obstruction 2. Hyponatremia, suspect hypovolemic UNa < 10 3. Recent finding metstatic prostate Ca 4. Perirectal abscess  5. DKA / Hyperglycemia 6. Septic Shock on NE 7. Hx/o CVA 8. ASCVD hx/o CABG 9. Seizure disorder   P . Cont hyration, supportive care . Not a candidate for HD . Rpt BMP now . Consult palliative care, discussed at length with ex-wife . Medication Issues; o Preferred narcotic agents for pain control are hydromorphone, fentanyl, and methadone. Morphine should not be used.  o Baclofen should be avoided o Avoid oral sodium phosphate and magnesium citrate based laxatives / bowel preps    Pearson Grippe MD 06/12/2020, 3:30 PM  Recent Labs  Lab 06/11/20 1816 06/11/20 1816 06/12/20 0042 06/12/20 0530 06/12/20 0845  NA 113*   < > 117* 118* 121*  120*  K 5.2*   < > 4.7 4.3 4.5  CL 80*   < > 88* 88* 93*  CO2 11*   < > 16* 15* 14*  GLUCOSE 440*   < > 246* 233* 198*  BUN 120*   < > 104* 112* 108*  CREATININE 5.26*   < > 5.02* 5.07* 5.04*  CALCIUM 8.2*   < > 7.9* 8.0* 7.9*  PHOS 6.4*  --   --  5.4*  --    < > = values in this interval not displayed.   Recent Labs  Lab 06/11/20 1434 06/12/20 0530  WBC 25.6* 20.9*  NEUTROABS 23.1*  --   HGB 7.5* 6.0*  HCT 22.2* 17.8*  MCV 94.9 94.2  PLT 251 196

## 2020-06-12 NOTE — Procedures (Signed)
Arterial Catheter Insertion Procedure Note  [name]  [id number]  1945/11/30  Date:06/12/20  Time:12:44 AM    Provider Performing: Colbert Coyer P    Procedure: Insertion of Arterial Line [phone]) without US guidance  Indication(s) Blood pressure monitoring and/or need for frequent ABGs  Consent Risks of the procedure as well as the alternatives and risks of each were explained to the patient and/or caregiver.  Consent for the procedure was obtained and is signed in the bedside chart  Anesthesia None   Time Out Verified patient identification, verified procedure, site/side was marked, verified correct patient position, special equipment/implants available, medications/allergies/relevant history reviewed, required imaging and test results available.   Sterile Technique Maximal sterile technique including full sterile barrier drape, hand hygiene, sterile gown, sterile gloves, mask, hair covering, sterile ultrasound probe cover (if used).   Procedure Description Area of catheter insertion was cleaned with chlorhexidine and draped in sterile fashion. Without real-time ultrasound guidance an arterial catheter was placed into the right radial artery.  Appropriate arterial tracings confirmed on monitor.     Complications/Tolerance None; patient tolerated the procedure well.   EBL none   Specimen(s) None

## 2020-06-12 NOTE — Plan of Care (Signed)
Discussed with patient plan of care, pain management, admission questions and interventions with some teach back displayed.  Patient has left side weakness and limitations d/t past stroke.  Unable to obtain accurate blood pressure except for right arm.  Patient has  ART line pressures and CVP monitoring at this time

## 2020-06-12 NOTE — Progress Notes (Addendum)
[facility] Progress Note Patient Name: [name] DOB: November 16, 1945 MRN: [id number]   Date of Service  06/12/2020  HPI/Events of Note  Patient with a drop in hemoglobin to 6.o gm associated with hypotension. I obtained permission to transfuse 2 units of PRBC from his ex-wife [facility], who is his POA.  eICU Interventions  Transfuse 2 units of PRBC. Check PT-INR, PTT.        Kerry Kass Tory Mckissack 06/12/2020, 6:20 AM

## 2020-06-12 NOTE — Progress Notes (Signed)
CRITICAL VALUE ALERT  Critical Value:  Sodium 117 (improved)  Date & Time Notied:  0147 06/12/20  Provider Notified: elink  Orders Received/Actions taken: continue to monitor

## 2020-06-12 NOTE — Progress Notes (Signed)
[place] Progress Note Patient Name: [name] DOB: Feb 04, 1946 MRN: [id number]   Date of Service  06/12/2020  HPI/Events of Note  Hypotension.  eICU Interventions  Vasopressin increased to 0.04 mcg, Albumin 5 % 250 ml fluid bolus x 1        Josiane Labine U Jo-Ann Johanning 06/12/2020, 5:14 AM

## 2020-06-13 ENCOUNTER — Inpatient Hospital Stay (HOSPITAL_COMMUNITY): Payer: Medicare Other

## 2020-06-13 DIAGNOSIS — Z515 Encounter for palliative care: Secondary | ICD-10-CM

## 2020-06-13 DIAGNOSIS — Z7189 Other specified counseling: Secondary | ICD-10-CM

## 2020-06-13 DIAGNOSIS — R531 Weakness: Secondary | ICD-10-CM

## 2020-06-13 DIAGNOSIS — E0810 Diabetes mellitus due to underlying condition with ketoacidosis without coma: Secondary | ICD-10-CM

## 2020-06-13 DIAGNOSIS — I361 Nonrheumatic tricuspid (valve) insufficiency: Secondary | ICD-10-CM

## 2020-06-13 DIAGNOSIS — I340 Nonrheumatic mitral (valve) insufficiency: Secondary | ICD-10-CM

## 2020-06-13 LAB — GLUCOSE, CAPILLARY
Glucose-Capillary: 190 mg/dL — ABNORMAL HIGH (ref 70–99)
Glucose-Capillary: 150 mg/dL — ABNORMAL HIGH (ref 70–99)
Glucose-Capillary: 149 mg/dL — ABNORMAL HIGH (ref 70–99)
Glucose-Capillary: 155 mg/dL — ABNORMAL HIGH (ref 70–99)

## 2020-06-13 LAB — BPAM RBC
Blood Product Expiration Date: 202109242359
Blood Product Expiration Date: 202109242359
ISSUE DATE / TIME: 202108310953
ISSUE DATE / TIME: 202108311245
Unit Type and Rh: 7300
Unit Type and Rh: 7300

## 2020-06-13 LAB — TYPE AND SCREEN
ABO/RH(D): B POS
Antibody Screen: NEGATIVE
Unit division: 0
Unit division: 0

## 2020-06-13 LAB — BASIC METABOLIC PANEL
Anion gap: 12 (ref 5–15)
BUN: 104 mg/dL — ABNORMAL HIGH (ref 8–23)
CO2: 16 mmol/L — ABNORMAL LOW (ref 22–32)
Calcium: 8.1 mg/dL — ABNORMAL LOW (ref 8.9–10.3)
Chloride: 96 mmol/L — ABNORMAL LOW (ref 98–111)
Creatinine, Ser: 4.47 mg/dL — ABNORMAL HIGH (ref 0.61–1.24)
GFR calc Af Amer: 14 mL/min — ABNORMAL LOW (ref >60)
GFR calc non Af Amer: 12 mL/min — ABNORMAL LOW (ref >60)
Glucose, Bld: 143 mg/dL — ABNORMAL HIGH (ref 70–99)
Potassium: 4.7 mmol/L (ref 3.5–5.1)
Sodium: 124 mmol/L — ABNORMAL LOW (ref 135–145)

## 2020-06-13 LAB — URINE CULTURE: Culture: 40000 — AB

## 2020-06-13 LAB — OSMOLALITY: Osmolality: 298 mOsm/kg — ABNORMAL HIGH (ref 275–295)

## 2020-06-13 LAB — ECHOCARDIOGRAM COMPLETE
Area-P 1/2: 4.49 cm2
Height: 64 in
S' Lateral: 2.8 cm
Weight: 2800.72 oz

## 2020-06-13 LAB — MRSA PCR SCREENING: MRSA by PCR: NEGATIVE

## 2020-06-13 LAB — CBC
HCT: 25.8 % — ABNORMAL LOW (ref 39.0–52.0)
Hemoglobin: 8.9 g/dL — ABNORMAL LOW (ref 13.0–17.0)
MCH: 29.9 pg (ref 26.0–34.0)
MCHC: 34.5 g/dL (ref 30.0–36.0)
MCV: 86.6 fL (ref 80.0–100.0)
Platelets: 224 10*3/uL (ref 150–400)
RBC: 2.98 MIL/uL — ABNORMAL LOW (ref 4.22–5.81)
RDW: 20.3 % — ABNORMAL HIGH (ref 11.5–15.5)
WBC: 16.9 10*3/uL — ABNORMAL HIGH (ref 4.0–10.5)
nRBC: 0 % (ref 0.0–0.2)

## 2020-06-13 LAB — OSMOLALITY, URINE: Osmolality, Ur: 256 mOsm/kg — ABNORMAL LOW (ref 300–900)

## 2020-06-13 MED ORDER — OXYCODONE-ACETAMINOPHEN 5-325 MG PO TABS
1.0000 | ORAL_TABLET | ORAL | Status: DC | PRN
  Administered 2020-06-13 – 2020-06-16 (×5): 1 via ORAL
  Filled 2020-06-13 (×6): qty 1

## 2020-06-13 MED ORDER — SODIUM CHLORIDE 0.9 % IV SOLN: INTRAVENOUS | Status: DC

## 2020-06-13 NOTE — TOC Progression Note (Signed)
Transition of Care [facility]) - Progression Note    Patient Details  Name: [name] MRN: [id number] Date of Birth: 1945-10-19  Transition of Care [facility]) CM/SW Contact  [name], RN Phone Number: 06/13/2020, 7:51 AM  Clinical Narrative:    Hydrotherapy started to the left butt cheeck at the rectum due to open wound with tunneling. rec'ing one unit of bld this am due to low hgb.   Expected Discharge Plan: Home/Self Care Barriers to Discharge: Continued Medical Work up  Expected Discharge Plan and Services Expected Discharge Plan: Home/Self Care   Discharge Planning Services: CM Consult   Living arrangements for the past 2 months: Single Family Home                                       Social Determinants of Health (SDOH) Interventions    Readmission Risk Interventions No flowsheet data found.

## 2020-06-13 NOTE — Progress Notes (Signed)
NAME:  [name], MRN:  [id number], DOB:  [birth year], LOS: 2 ADMISSION DATE:  06/11/2020, CONSULTATION DATE:  06/11/2020 REFERRING MD:  Jaquita Rector doc, CHIEF COMPLAINT:  Sepsis, AKI   Brief History   74 year old gentleman with perirectal abscess Not been feeling well for a whole week Wound started draining a couple of days ago-cottage cheese Fevers up to 101.6 at home Blood sugar over 300  History of present illness   Metastatic prostate cancer with metastasis to the bone-new diagnosis that was just found recently Chronic indwelling Foley  Past Medical History   Past Medical History:  Diagnosis Date  . At high risk for falls   . Bladder cancer [facility])    urologist-  dr Gaynelle Arabian  . CAD (coronary artery disease) cardiologist-  dr berry  . Cerebrovascular arteriosclerosis   . Gait disturbance, post-stroke    uses cane, rollator, and w/c long distance  . Hemiparkinsonism [facility]) neurologist-- dr Leta Baptist   right side body  . History of basal cell carcinoma excision    Sept 2016--  MOH's surgery side of nose  . History of carotid artery stenosis cardiologist-- dr berry   bilateral --  1999 s/p  bilateral ICA endarterectomy and recurrent restenosis 2012  s/p  staged bilateral stenting   per last duplex 2015  stents widely patent  . History of CVA with residual deficit neurologist-  dr Leta Baptist   3/ 1999  Right MCA  and  12/ 2004  Anterior division of  Right MCA ---  residual left spactic hemiparesis and left foot drop  . History of MI (myocardial infarction)    02/ 2004   s/p  cabg   . HOH (hard of hearing)    LEFT EAR  POST cva  . Hyperextension deformity of left knee    wears brace  . Hyperlipidemia   . Hypertension   . Left foot drop    residual from CVA  -- wears brace  . Left spastic hemiparesis ([place])    residual CVA 1999  . Pancreatic pseudocyst   . Peripheral arterial disease ([place])   . S/P CABG x 4    02/ 2004  . Seizure disorder [facility]) last seizure 2011 due to  confusion   neurologist-  dr Leta Baptist-- per note seizure documented 2008 breakthrough partial complex seizure (prior tonic-clonic seizure's post CVA)  . Type 2 diabetes, diet controlled ([place])     2  . Visual neglect    LEFT EYE   Significant Hospital Events   Low blood pressure 8/31- awake and interactive, still requiring pressors  Consults:  Renal pccm  Procedures:  None 8/30 central line placement  Significant Diagnostic Tests:   chest x-ray 8/30-no acute infiltrate  Micro Data:  Blood cultures x2 Urine cultures   Antimicrobials:  Zosyn 8/30>> Vancomycin 8/30>> Flagyl 8/30-8/31  Interim history/subjective:  Chronically ill-appearing Hypotensive Feels generally better this morning, complaining of some pain today  Objective   Blood pressure (!) 95/44, pulse 78, temperature 98.2 F (36.8 C), temperature source Oral, resp. rate 17, height 5\' 4"  (1.626 m), weight 79.4 kg, SpO2 95 %. CVP:  [8 mmHg-22 mmHg] 15 mmHg      Intake/Output Summary (Last 24 hours) at 06/13/2020 0846 Last data filed at 06/13/2020 0800 Gross per 24 hour  Intake 4604.34 ml  Output 910 ml  Net 3694.34 ml   Filed Weights   06/11/20 1512 06/12/20 0500 06/13/20 0257  Weight: 66.7 kg 77.8 kg 79.4 kg    Examination:  General: Chronically ill-appearing gentleman, frail, alert and oriented HENT: Moist oral mucosa Lungs: Few rales at the bases Cardiovascular: S1-S2 appreciated, no murmur Abdomen: Soft, nontender, bowel sounds appreciated Extremities: No clubbing, no edema Neuro: Alert and oriented x3 GU: Poor output Unstageable ulcer of sacrum  About 7 L positive in total  Resolved Hospital Problem list     Assessment & Plan:  Septic shock -Fluid resuscitation -Off pressors -Vancomycin and cefepime, Flagyl will be discontinued today -Cultures negative so far -Leukocytosis better, recheck today  DKA -Gap narrowed -Tolerating orally -Sugar controlled -On SSI  Acute kidney  injury -Mostly prerenal, some component of ATN -Maintain MAP greater than 65 to maintain renal perfusion -Appreciate renal assistance  Hyponatremia -Continue to monitor closely  Metastatic prostate cancer -Extensive metastasis to bones -Very advanced disease -Involve palliative care  Buttock abscess -Unstageable ulcer -Started hydrotherapy -Wound therapy involved -Appreciate surgery involvement  Anemia -S/p transfusion of 1 unit yesterday  Heart failure -Diastolic dysfunction in November 2020 echo -Echocardiogram ordered -BNP was over thousand   History of CVA -Residual weakness on the left side  Parkinson's disease  History of CABG  Seizure disorder -On Keppra  Multiple comorbidities will make him a poor dialysis candidate  Best practice:  Diet: Tolerating diet Pain/Anxiety/Delirium protocol (if indicated): Percocet VAP protocol (if indicated): Not indicated DVT prophylaxis: Heparin GI prophylaxis: Taking in orally Glucose control: SSI Mobility: Bedrest Code Status: Full code Family Communication: Will update Disposition: ICU  Labs   CBC: Recent Labs  Lab 06/11/20 1434 06/12/20 0530 06/12/20 1851  WBC 25.6* 20.9*  --   NEUTROABS 23.1*  --   --   HGB 7.5* 6.0* 8.1*  HCT 22.2* 17.8* 24.2*  MCV 94.9 94.2  --   PLT 251 196  --     Basic Metabolic Panel: Recent Labs  Lab 06/11/20 1816 06/11/20 1816 06/12/20 0042 06/12/20 0530 06/12/20 0845 06/12/20 1600 06/13/20 0302  NA 113*   < > 117* 118* 121*  120* 122* 124*  K 5.2*   < > 4.7 4.3 4.5 4.8 4.7  CL 80*   < > 88* 88* 93* 95* 96*  CO2 11*   < > 16* 15* 14* 14* 16*  GLUCOSE 440*   < > 246* 233* 198* 185* 143*  BUN 120*   < > 104* 112* 108* 109* 104*  CREATININE 5.26*   < > 5.02* 5.07* 5.04* 4.76* 4.47*  CALCIUM 8.2*   < > 7.9* 8.0* 7.9* 7.8* 8.1*  MG  --   --   --  1.9  --   --   --   PHOS 6.4*  --   --  5.4*  --   --   --    < > = values in this interval not displayed.    GFR: Estimated Creatinine Clearance: 13.4 mL/min (A) (by C-G formula based on SCr of 4.47 mg/dL (H)). Recent Labs  Lab 06/11/20 1346 06/11/20 1434 06/12/20 0042 06/12/20 0530  WBC  --  25.6*  --  20.9*  LATICACIDVEN 1.3  --  0.9  --     Liver Function Tests: Recent Labs  Lab 06/11/20 1434 06/11/20 1816  AST 18  --   ALT 7  --   ALKPHOS 91  --   BILITOT 0.9  --   PROT 6.2*  --   ALBUMIN 2.8* 2.4*   No results for input(s): LIPASE, AMYLASE in the last 168 hours. No results for input(s): AMMONIA in the last  168 hours.  ABG    Component Value Date/Time   PHART 7.340 (L) 06/12/2020 0518   PCO2ART 25.6 (L) 06/12/2020 0518   PO2ART 62.0 (L) 06/12/2020 0518   HCO3 13.4 (L) 06/12/2020 0518   TCO2 25 10/29/2015 0749   ACIDBASEDEF 11.1 (H) 06/12/2020 0518   O2SAT 90.0 06/12/2020 0518     Coagulation Profile: Recent Labs  Lab 06/11/20 1434 06/12/20 0656  INR 1.1 1.2    Cardiac Enzymes: No results for input(s): CKTOTAL, CKMB, CKMBINDEX, TROPONINI in the last 168 hours.  HbA1C: Hgb A1c MFr Bld  Date/Time Value Ref Range Status  08/27/2019 02:57 AM 7.5 (H) 4.8 - 5.6 % Final    Comment:    (NOTE) Pre diabetes:          5.7%-6.4% Diabetes:              >6.4% Glycemic control for   <7.0% adults with diabetes   03/23/2017 12:17 PM 6.4 (H) 4.8 - 5.6 % Final    Comment:    (NOTE)         Pre-diabetes: 5.7 - 6.4         Diabetes: >6.4         Glycemic control for adults with diabetes: <7.0     CBG: Recent Labs  Lab 06/12/20 0957 06/12/20 1101 06/12/20 1213 06/12/20 1353 06/12/20 1513  GLUCAP 198* 200* 180* 182* 176*    Review of Systems:   Very frail but, improving overall  Past Medical History  He,  has a past medical history of At high risk for falls, Bladder cancer ([place]), CAD (coronary artery disease) (cardiologist-  dr berry), Cerebrovascular arteriosclerosis, Gait disturbance, post-stroke, Hemiparkinsonism ([place]) (neurologist-- dr Leta Baptist),  History of basal cell carcinoma excision, History of carotid artery stenosis (cardiologist-- dr berry), History of CVA with residual deficit (neurologist-  dr Leta Baptist), History of MI (myocardial infarction), HOH (hard of hearing), Hyperextension deformity of left knee, Hyperlipidemia, Hypertension, Left foot drop, Left spastic hemiparesis ([place]), Pancreatic pseudocyst, Peripheral arterial disease ([place]), S/P CABG x 4, Seizure disorder ([place]) (last seizure 2011 due to confusion), Type 2 diabetes, diet controlled ([place]), and Visual neglect.   Surgical History    Past Surgical History:  Procedure Laterality Date  . CARDIAC CATHETERIZATION  11/17/2002   significant left main disease and 3 vessel CAD, mildly depressed LV systolic  fx, 97% left renal artery stenosis  . CAROTID ENDARTERECTOMY  12/1997   [facility])   Bilateral ICA  . CAROTID STENT INSERTION Bilateral dr berry and dr Kristine Royal--  (In-stent Restenosis)  LeftICA   02-12-2011;  [place]   02-26-2011  . CORONARY ARTERY BYPASS GRAFT  2/ 2004   dr hendrickson   x3 LIMA to LAD,free right internal mammary artery to obtuse marginal 1,SVG to distal right coronary.  . CYSTOSCOPY W/ RETROGRADES Bilateral 09/10/2015   Procedure: CYSTOSCOPY WITH RETROGRADE PYELOGRAM;  Surgeon: Carolan Clines, MD;  Location: [facility];  Service: Urology;  Laterality: Bilateral;  . CYSTOSCOPY WITH BIOPSY N/A 09/10/2015   Procedure: CYSTOSCOPY WITH BIOPSY,RIGHT TRIGONAL TUMOR 1.5 CM, EXCISIONAL BIOPSY WITH TAUBER FORCEP RIGHT POSTERIOR BLADDER WALL 1 CM, EXCISIONAL BIOPSY SATELITE BLADDER WALL 1 CM;  Surgeon: Carolan Clines, MD;  Location: [facility];  Service: Urology;  Laterality: N/A;  . CYSTOSCOPY WITH BIOPSY N/A 03/26/2017   Procedure: CYSTOSCOPY WITH BIOPSY/;  Surgeon: Carolan Clines, MD;  Location: [facility];  Service: Urology;  Laterality: N/A;  . CYSTOSCOPY WITH HYDRODISTENSION AND  BIOPSY N/A 10/29/2015   Procedure:  REPEAT CYSTOSCOPY BIOPSY BLADDER DEEP MUSCLES BLADDER BIOPSY;  Surgeon: Carolan Clines, MD;  [facility];  Service: Urology;  Laterality: N/A;  . CYSTOSCOPY WITH URETHRAL DILATATION N/A 09/10/2015   Procedure: CYSTOSCOPY WITH URETHRAL DILATATION;  Surgeon: Carolan Clines, MD;  Location: [place];  Service: Urology;  Laterality: N/A;  . FULGURATION OF BLADDER TUMOR N/A 03/26/2017   Procedure: FULGURATION OF BLADDER TUMOR;  Surgeon: Carolan Clines, MD;  Location: [facility];  Service: Urology;  Laterality: N/A;  . [place]  . MOHS SURGERY  06-20-2015   side of nose  . NM MYOCAR PERF WALL MOTION  01/09/2011   dr berry   mild to mod. perfusion defect in the basal inferolateral & mid inferolaterl regions consistant with an infarct/scar, no signigicant ischemia demonstracted/  Low Risk scan, no sig. change from previous study/  normal LV function and wall motion , ef 63%  . ORIF RIGHT ANKLE FX  1998   hardware retained  . TRANSURETHRAL RESECTION OF BLADDER TUMOR N/A 09/10/2015   Procedure: CYSTO TRANSURETHRAL RESECTION OF BLADDER TUMOR (TURBT) OF LEFT POSTERIOR BLADDER TUMOR 2 CM;  Surgeon: Carolan Clines, MD;  Location: [facility];  Service: Urology;  Laterality: N/A;  . TRANSURETHRAL RESECTION OF BLADDER TUMOR Left 04/17/2016   Procedure: TRANSURETHRAL RESECTION OF BLADDER TUMOR (TURBT);  Surgeon: Carolan Clines, MD;  Location: [facility];  Service: Urology;  Laterality: Left;  . US ECHOCARDIOGRAPHY  01/03/2011   normal LVF, ef>55%/  mild LAE/ trace MR and TR,  mild AV sclerosis without stenosis     Social History   reports that he quit smoking about 12 years ago. His smoking use included cigarettes. He quit after 5.00 years of use. He has never used smokeless tobacco. He reports that he does not drink alcohol and does not use drugs.   Family History   His family history includes Diabetes in his brother;  Heart attack in his father; Hyperlipidemia in his brother and sister; Hypertension in his brother and sister; Stroke in his father and mother.   Allergies Allergies  Allergen Reactions  . Altace [Ramipril] Cough    The patient is critically ill with multiple organ systems failure and requires high complexity decision making for assessment and support, frequent evaluation and titration of therapies, application of advanced monitoring technologies and extensive interpretation of multiple databases. Critical Care Time devoted to patient care services described in this note independent of APP/resident time (if applicable)  is 35 minutes.   Sherrilyn Rist MD [facility] Personal pager: [phone] If unanswered, please page CCM On-call: [phone]

## 2020-06-13 NOTE — Progress Notes (Signed)
Admit: 06/11/2020 LOS: 2  39M metastatic prostate DCa, admit with anorexia/N/V, perirectal abscess, aKI, hyponatremia  Subjective:  . Off pressors . More awake and alert . SNa up to 124, SCr down to 4.47 . Good UOP . They are anticipating palliative conversation, ex -wife has d/w children  08/31 0701 - 09/01 0700 In: 4532.5 [P.O.:950; I.V.:2619.2; Blood:630; IV Piggyback:333.3] Out: 910 [Urine:910]  Filed Weights   06/11/20 1512 06/12/20 0500 06/13/20 0257  Weight: 66.7 kg 77.8 kg 79.4 kg    Scheduled Meds: . carbidopa-levodopa  2 tablet Oral TID  . chlorhexidine  15 mL Mouth Rinse BID  . Chlorhexidine Gluconate Cloth  6 each Topical Daily  . dantrolene  50 mg Oral BID  . dipyridamole-aspirin  1 capsule Oral BID  . heparin  5,000 Units Subcutaneous Q8H  . insulin aspart  0-5 Units Subcutaneous QHS  . insulin aspart  0-9 Units Subcutaneous TID WC  . insulin aspart  3 Units Subcutaneous TID WC  . insulin detemir  0.3 Units/kg Subcutaneous Q24H  . levETIRAcetam  500 mg Oral Daily  . mouth rinse  15 mL Mouth Rinse q12n4p  . vancomycin variable dose per unstable renal function (pharmacist dosing)   Does not apply See admin instructions   Continuous Infusions: . sodium chloride Stopped (06/12/20 0635)  . sodium chloride Stopped (06/12/20 0108)  . ceFEPime (MAXIPIME) IV Stopped (06/13/20 0729)  . lactated ringers 75 mL/hr at 06/13/20 0255  . norepinephrine (LEVOPHED) Adult infusion Stopped (06/12/20 1829)  . vasopressin Stopped (06/12/20 1145)   PRN Meds:.dextrose, docusate sodium, oxyCODONE-acetaminophen, polyethylene glycol  Current Labs: reviewed    Physical Exam:  Blood pressure (!) 119/40, pulse 75, temperature 97.8 F (36.6 C), temperature source Oral, resp. rate 20, height 5\' 4"  (1.626 m), weight 79.4 kg, SpO2 94 %. Chronically ill appearing, L sided paresis in face, UEs Sacral ulcer present RRR CTAB Soft Trace LEE  A 1. AKI; likely pre-renal hypovolemia  with N/V/poor PO + ARB; 8/30 CT w/o obstruction 2. Hyponatremia, suspect hypovolemic UNa < 10 3. Recent finding metstatic prostate Ca 4. Perirectal abscess  5. DKA / Hyperglycemia 6. Septic Shock on NE 7. Hx/o CVA 8. ASCVD hx/o CABG 9. Seizure disorder  10. Parkinsonism  P . Cont hyration, supportive care for another 24h . Not a candidate for HD . F/u palliative consult . Medication Issues; o Preferred narcotic agents for pain control are hydromorphone, fentanyl, and methadone. Morphine should not be used.  o Baclofen should be avoided o Avoid oral sodium phosphate and magnesium citrate based laxatives / bowel preps    Pearson Grippe MD 06/13/2020, 1:28 PM  Recent Labs  Lab 06/11/20 1816 06/12/20 0042 06/12/20 0530 06/12/20 0530 06/12/20 0845 06/12/20 1600 06/13/20 0302  NA 113*   < > 118*   < > 121*  120* 122* 124*  K 5.2*   < > 4.3   < > 4.5 4.8 4.7  CL 80*   < > 88*   < > 93* 95* 96*  CO2 11*   < > 15*   < > 14* 14* 16*  GLUCOSE 440*   < > 233*   < > 198* 185* 143*  BUN 120*   < > 112*   < > 108* 109* 104*  CREATININE 5.26*   < > 5.07*   < > 5.04* 4.76* 4.47*  CALCIUM 8.2*   < > 8.0*   < > 7.9* 7.8* 8.1*  PHOS 6.4*  --  5.4*  --   --   --   --    < > =  values in this interval not displayed.   Recent Labs  Lab 06/11/20 1434 06/11/20 1434 06/12/20 0530 06/12/20 1851 06/13/20 0906  WBC 25.6*  --  20.9*  --  16.9*  NEUTROABS 23.1*  --   --   --   --   HGB 7.5*   < > 6.0* 8.1* 8.9*  HCT 22.2*   < > 17.8* 24.2* 25.8*  MCV 94.9  --  94.2  --  86.6  PLT 251  --  196  --  224   < > = values in this interval not displayed.

## 2020-06-13 NOTE — Consult Note (Signed)
Consultation Note Date: 06/13/2020   Patient Name: [name]  DOB: Oct 19, 1945  MRN: [id number]  Age / Sex: 74 y.o., male  PCP: [name], MD Referring Physician: Laurin Coder, MD  Reason for Consultation: Establishing goals of care  HPI/Patient Profile: 74 y.o. male   admitted on 06/11/2020   55M metastatic prostate DCa, admit with anorexia/N/V, perirectal abscess, AKI, hyponatremia.   Clinical Assessment and Goals of Care: 74 year old gentleman who lives at home with [name], caregiver healthcare power of attorney agent here in [place], [place]. Patient has past medical history significant for parkinsonism, seizure disorder, coronary artery disease status post CABG, history of stroke with residual left-sided weakness. Patient also has history of metastatic prostate cancer and follows [facility]. Patient has been admitted with perirectal abscess, acute kidney injury.  Patient remains in the stepdown unit at [facility], under critical care service. Nephrology specialists are following. Patient is undergoing hydrotherapy. Palliative service consultation has been requested for CODE STATUS and goals of care discussions.  Patient is awake alert resting in bed. He has eaten some soft/full liquids type diet today. His [name]/caregiver is present at the bedside. I introduced myself and palliative care as follows:  Palliative medicine is specialized medical care for people living with serious illness. It focuses on providing relief from the symptoms and stress of a serious illness. The goal is to improve quality of life for both the patient and the family.  Goals of care: Broad aims of medical therapy in relation to the patient's values and preferences. Our aim is to provide medical care aimed at enabling patients to achieve the goals that matter most to them, given the  circumstances of their particular medical situation and their constraints.   Brief life review performed. Goals wishes and values important to the patient and family as a unit attempted to be explored. Discussed about patient's underlying serious conditions as well as factors contributing to current hospitalization. Patient is with perirectal abscess, hydrotherapy efforts are ongoing. Renal colleagues are following and the serum creatinine is being monitored carefully. Patient is not a candidate for hemodialysis. Recent imaging has shown extensive bone metastases from his prostate cancer.  CODE STATUS discussions undertaken. We discussed extensively about full code versus DO NOT RESUSCITATE. Patient states that he was asked the same question in the emergency department and states that his answer is still the same. He would want CPR, ACLS protocol, resuscitative efforts.  We discussed about differences between hospice and palliative. Patient's [name] is getting a ramp installed in front of the house, already has a hospital bed, is trying to get a Harrel Lemon lift, already has a handicap shower available at home. She is very diligent in her care of the patient. She wishes to have him home as long as possible. They have children together who do not live nearby but who are available frequently and help out with the care. We discussed extensively about several serious conditions the patient is up against. Discussed frankly but compassionately that  the patient remains at high risk for ongoing decline decompensation and even death in spite of everybody's best efforts. The serious nature of several of his conditions are such as a advancing malignancy, serious infection, worsening kidney disease all explained to the best of my ability. See recommendations below. Thank you for the consult.  NEXT OF KIN  ex wife, HCPOA agent, has children.   SUMMARY OF RECOMMENDATIONS   Full code, full scope of care for now, discussed  directly with patient and wife at bedside.  Continue current mode of care for now.  Home with home based wound therapy, PT and home based palliative care on discharge.  Monitor hospital course and overall disease trajectory. If patient has ongoing decline/worsening renal indices/worsening infection, then would benefit from re visiting goals of care and at that time will benefit from considering DNR DNI and hospice. I have briefly discussed "worse-case scenario" with patient and wife, have discussed with them preliminarily about hospice philosophy of care and "comfort measures only" type of care.  Thank you for the consult. PMT to follow.   Code Status/Advance Care Planning:  Full code    Symptom Management:    as above.   Palliative Prophylaxis:   Delirium Protocol  Additional Recommendations (Limitations, Scope, Preferences):  Full Scope Treatment  Psycho-social/Spiritual:   Desire for further Chaplaincy support:yes  Additional Recommendations: Caregiving  Support/Resources  Prognosis:   Unable to determine  Discharge Planning: Home with Palliative Services      Primary Diagnoses: Present on Admission: . Sepsis ([place])   I have reviewed the medical record, interviewed the patient and family, and examined the patient. The following aspects are pertinent.  Past Medical History:  Diagnosis Date  . At high risk for falls   . Bladder cancer [facility])    urologist-  dr Gaynelle Arabian  . CAD (coronary artery disease) cardiologist-  dr berry  . Cerebrovascular arteriosclerosis   . Gait disturbance, post-stroke    uses cane, rollator, and w/c long distance  . Hemiparkinsonism [facility]) neurologist-- dr Leta Baptist   right side body  . History of basal cell carcinoma excision    Sept 2016--  MOH's surgery side of nose  . History of carotid artery stenosis cardiologist-- dr berry   bilateral --  1999 s/p  bilateral ICA endarterectomy and recurrent restenosis 2012  s/p  staged  bilateral stenting   per last duplex 2015  stents widely patent  . History of CVA with residual deficit neurologist-  dr Leta Baptist   3/ 1999  Right MCA  and  12/ 2004  Anterior division of  Right MCA ---  residual left spactic hemiparesis and left foot drop  . History of MI (myocardial infarction)    02/ 2004   s/p  cabg   . HOH (hard of hearing)    LEFT EAR  POST cva  . Hyperextension deformity of left knee    wears brace  . Hyperlipidemia   . Hypertension   . Left foot drop    residual from CVA  -- wears brace  . Left spastic hemiparesis ([place])    residual CVA 1999  . Pancreatic pseudocyst   . Peripheral arterial disease ([place])   . S/P CABG x 4    02/ 2004  . Seizure disorder [facility]) last seizure 2011 due to confusion   neurologist-  dr Leta Baptist-- per note seizure documented 2008 breakthrough partial complex seizure (prior tonic-clonic seizure's post CVA)  . Type 2 diabetes, diet controlled ([place])  2  . Visual neglect    LEFT EYE   Social History   Socioeconomic History  . Marital status: Divorced    Spouse name: Not on file  . Number of children: 4  . Years of education: HS  . Highest education level: Not on file  Occupational History    Employer: RETIRED  Tobacco Use  . Smoking status: Former Smoker    Years: 5.00    Types: Cigarettes    Quit date: 11/14/2007    Years since quitting: 12.5  . Smokeless tobacco: Never Used  Substance and Sexual Activity  . Alcohol use: No  . Drug use: No  . Sexual activity: Not on file  Other Topics Concern  . Not on file  Social History Narrative   Patient lives at home with his caregiver/[name].   Caffeine- 2 cups of coffee daily   Social Determinants of Health   Financial Resource Strain:   . Difficulty of Paying Living Expenses: Not on file  Food Insecurity:   . Worried About Charity fundraiser in the Last Year: Not on file  . Ran Out of Food in the Last Year: Not on file  Transportation Needs:   . Lack of  Transportation (Medical): Not on file  . Lack of Transportation (Non-Medical): Not on file  Physical Activity:   . Days of Exercise per Week: Not on file  . Minutes of Exercise per Session: Not on file  Stress:   . Feeling of Stress : Not on file  Social Connections:   . Frequency of Communication with Friends and Family: Not on file  . Frequency of Social Gatherings with Friends and Family: Not on file  . Attends Religious Services: Not on file  . Active Member of Clubs or Organizations: Not on file  . Attends Archivist Meetings: Not on file  . Marital Status: Not on file   Family History  Problem Relation Age of Onset  . Stroke Father   . Heart attack Father   . Stroke Mother   . Diabetes Brother   . Hyperlipidemia Brother   . Hypertension Brother   . Hyperlipidemia Sister   . Hypertension Sister    Scheduled Meds: . carbidopa-levodopa  2 tablet Oral TID  . chlorhexidine  15 mL Mouth Rinse BID  . Chlorhexidine Gluconate Cloth  6 each Topical Daily  . dantrolene  50 mg Oral BID  . dipyridamole-aspirin  1 capsule Oral BID  . heparin  5,000 Units Subcutaneous Q8H  . insulin aspart  0-5 Units Subcutaneous QHS  . insulin aspart  0-9 Units Subcutaneous TID WC  . insulin aspart  3 Units Subcutaneous TID WC  . insulin detemir  0.3 Units/kg Subcutaneous Q24H  . levETIRAcetam  500 mg Oral Daily  . mouth rinse  15 mL Mouth Rinse q12n4p  . vancomycin variable dose per unstable renal function (pharmacist dosing)   Does not apply See admin instructions   Continuous Infusions: . sodium chloride Stopped (06/12/20 0635)  . ceFEPime (MAXIPIME) IV Stopped (06/13/20 0729)  . lactated ringers 75 mL/hr at 06/13/20 0255   PRN Meds:.dextrose, docusate sodium, oxyCODONE-acetaminophen, polyethylene glycol Medications Prior to Admission:  Prior to Admission medications   Medication Sig Start Date End Date Taking? Authorizing Provider  amLODipine (NORVASC) 10 MG tablet TAKE ONE  TABLET BY MOUTH DAILY 10/17/19  Yes Kilroy, Luke K, PA-C  carbidopa-levodopa (SINEMET IR) 25-100 MG tablet Take 2 tablets by mouth 3 (three) times daily. Change:  Take Two tabs 3 x daily 01/16/20  Yes Penumalli, Earlean Polka, MD  Cholecalciferol (VITAMIN D) 50 MCG (2000 UT) CAPS Take 1 capsule by mouth daily.   Yes [provider]  Coenzyme Q10 (COQ-10) 50 MG CAPS Take 1 capsule by mouth daily.    Yes [provider]  dantrolene (DANTRIUM) 50 MG capsule Take 1 capsule (50 mg total) by mouth 2 (two) times daily. 09/01/19  Yes Meredith Staggers, MD  dipyridamole-aspirin (AGGRENOX) 200-25 MG 12hr capsule TAKE ONE CAPSULE BY MOUTH TWICE A DAY 03/21/20  Yes Penumalli, Earlean Polka, MD  ezetimibe (ZETIA) 10 MG tablet TAKE ONE TABLET BY MOUTH DAILY 12/19/19  Yes Lorretta Harp, MD  fenofibrate (TRICOR) 48 MG tablet TAKE ONE TABLET BY MOUTH DAILY 04/24/20  Yes Lorretta Harp, MD  Flaxseed, Linseed, 1000 MG CAPS Take 1,000 mg by mouth every evening.    Yes [provider]  FOLBIC 2.5-25-2 MG TABS tablet Take 1 tablet by mouth once daily with food Patient taking differently: Take 1 tablet by mouth daily.  05/24/20  Yes Lomax, Amy, NP  furosemide (LASIX) 20 MG tablet Take 20 mg by mouth daily.   Yes [provider]  hydrALAZINE (APRESOLINE) 25 MG tablet TAKE ONE TABLET BY MOUTH THREE TIMES A DAY 06/08/20  Yes Troy Sine, MD  insulin lispro (HUMALOG KWIKPEN) 100 UNIT/ML KiwkPen Inject 4 Units into the skin 3 (three) times daily as needed (high blood sugar > 200).    Yes [provider]  KEPPRA 500 MG tablet TAKE ONE TABLET BY MOUTH TWICE A DAY 07/27/19  Yes Penumalli, Vikram R, MD  metoprolol tartrate (LOPRESSOR) 25 MG tablet TAKE ONE TABLET BY MOUTH DAILY 12/14/19  Yes Lorretta Harp, MD  nitroGLYCERIN (NITROSTAT) 0.4 MG SL tablet Place 1 tablet (0.4 mg total) under the tongue every 5 (five) minutes as needed for chest pain. 10/19/17  Yes Lorretta Harp, MD  olmesartan  (BENICAR) 20 MG tablet TAKE ONE TABLET BY MOUTH DAILY 03/05/20  Yes Lorretta Harp, MD  RAPAFLO 8 MG CAPS capsule Take 8 mg by mouth daily with breakfast.  04/22/17  Yes [provider]  rosuvastatin (CRESTOR) 20 MG tablet TAKE ONE TABLET BY MOUTH DAILY 04/26/20  Yes Lorretta Harp, MD  TOUJEO SOLOSTAR 300 UNIT/ML SOPN Inject 16 Units into the skin at bedtime.  04/05/19  Yes [provider]  furosemide (LASIX) 20 MG tablet Take 1 tablet (20 mg total) by mouth daily. Patient not taking: Reported on 06/11/2020 11/02/19 12/02/19  Erlene Quan, PA-C  [facility] LANCETS 16R MISC  07/29/17   [provider]  Roma Schanz test strip  08/16/17   [provider]   Allergies  Allergen Reactions  . Altace [Ramipril] Cough   Review of Systems +weakness +lack of appetite  Physical Exam Chronic L sided weakness Awake alert Has sacral ulcer Has edema Regular Monitor noted Abdomen not distended  Vital Signs: BP (!) 119/40 (BP Location: Right Arm)   Pulse 75   Temp 97.8 F (36.6 C) (Oral)   Resp 20   Ht 5\' 4"  (1.626 m)   Wt 79.4 kg   SpO2 94%   BMI 30.05 kg/m  Pain Scale: 0-10   Pain Score: 0-No pain   SpO2: SpO2: 94 % O2 Device:SpO2: 94 % O2 Flow Rate: .O2 Flow Rate (L/min): 1 L/min  IO: Intake/output summary:   Intake/Output Summary (Last 24 hours) at 06/13/2020 1525 Last data  filed at 06/13/2020 0800 Gross per 24 hour  Intake 3297.4 ml  Output 910 ml  Net 2387.4 ml    LBM: Last BM Date: 06/12/20 Baseline Weight: Weight: 66.7 kg Most recent weight: Weight: 79.4 kg     Palliative Assessment/Data:   PPS 40%  Time In:  1400 Time Out:  1510 Time Total:  70 min.  Greater than 50%  of this time was spent counseling and coordinating care related to the above assessment and plan.  Signed by: Loistine Chance, MD   Please contact Palliative Medicine Team phone at [phone] for questions and concerns.  For individual provider: See  Shea Evans

## 2020-06-13 NOTE — Progress Notes (Signed)
  Echocardiogram 2D Echocardiogram with 3D has been performed.  [name] M 06/13/2020, 11:56 AM

## 2020-06-13 NOTE — Progress Notes (Signed)
   06/13/20 1300  Hydro treatment.  Subjective Assessment  Subjective I AM BETTER  Patient and Family Stated Goals to get better  Date of Onset  (present on Adm.)S/P debridement at Bedside  Prior Treatments  (none)  Evaluation and Treatment  Evaluation and Treatment Procedures Explained to Patient/Family Yes  Evaluation and Treatment Procedures agreed to  Pressure Injury 06/12/20 Rectum Left Unstageable - Full thickness tissue loss in which the base of the injury is covered by slough (yellow, tan, gray, green or brown) and/or eschar (tan, brown or black) in the wound bed. PT only- perirectal open wound with gray slough  Date First Assessed/Time First Assessed: 06/12/20 1406   Location: Rectum  Location Orientation: Left  Staging: Unstageable - Full thickness tissue loss in which the base of the injury is covered by slough (yellow, tan, gray, green or brown) and/or es...  Dressing Type Gauze (Comment);Normal saline moist dressing  Dressing Changed;Clean;Dry  Dressing Change Frequency Twice a day  State of Healing Eschar  Site / Wound Assessment Painful;Brown  % Wound base Red or Granulating 0%  % Wound base Black/Eschar 100%  Peri-wound Assessment Intact;Excoriated  Wound Length (cm) 4.5 cm  Wound Width (cm) 3.5 cm  Wound Depth (cm) 2 cm  Wound Surface Area (cm^2) 15.75 cm^2  Wound Volume (cm^3) 31.5 cm^3  Tunneling (cm) 4.5  Undermining (cm) 2  Margins Unattached edges (unapproximated)  Drainage Amount Minimal  Drainage Description Serosanguineous  Treatment Debridement (Selective);Hydrotherapy (Pulse lavage);Packing (Saline gauze)  Hydrotherapy  Pulsed lavage therapy - wound location left of rectum  Pulsed Lavage with Suction (psi) 4 psi  Pulsed Lavage with Suction - Normal Saline Used 1000 mL  Pulsed Lavage Tip Tip with splash shield  Selective Debridement  Selective Debridement - Location rectal area  Selective Debridement - Tools Used Forceps;Scissors  Selective  Debridement - Tissue Removed gray slough  Wound Therapy - Assess/Plan/Recommendations  Wound Therapy - Clinical Statement Patient tolerated well, Minimal bleeding superior to the wound. Patient does have some discomfort with removal of nonvialble tissue with forceps and scissors so minimized debridement.. Continue HYdro therapy.  Wound Therapy - Functional Problem List metastatic prostate cancer  with mets to bone, H/O seizure D/O. Paatient tolerates hydro treatment, noted small excoriated periwound bleeding. Patient does have some discomfort with debridement so minimized. Patient's caregiver present.  Factors Delaying/Impairing Wound Healing Diabetes Mellitus;Infection - systemic/local;Multiple medical problems (bony mets)  Hydrotherapy Plan Debridement;Patient/family education;Pulsatile lavage with suction  Wound Therapy - Frequency 6X / week  Wound Therapy - Current Recommendations PT  Wound Therapy - Follow Up Recommendations Skilled nursing facility  Wound Plan hydrotherapy  Wound Therapy Goals - Improve the function of patient's integumentary system by progressing the wound(s) through the phases of wound healing by:  Decrease Necrotic Tissue to 50  Decrease Necrotic Tissue - Progress Progressing toward goal  Increase Granulation Tissue to 50  Increase Granulation Tissue - Progress Progressing toward goal  Improve Drainage Characteristics Min  Improve Drainage Characteristics - Progress Progressing toward goal  Additional Wound Therapy Goal reposition off left gluteal area  Additional Wound Therapy Goal - Progress Progressing toward goal  Goals/treatment plan/discharge plan were made with and agreed upon by patient/family Yes  Time For Goal Achievement 2 weeks  [place] Pager [phone] Office [phone]

## 2020-06-14 ENCOUNTER — Encounter (HOSPITAL_COMMUNITY): Payer: Self-pay | Admitting: Pulmonary Disease

## 2020-06-14 DIAGNOSIS — E1110 Type 2 diabetes mellitus with ketoacidosis without coma: Secondary | ICD-10-CM

## 2020-06-14 DIAGNOSIS — I5033 Acute on chronic diastolic (congestive) heart failure: Secondary | ICD-10-CM

## 2020-06-14 DIAGNOSIS — N1831 Chronic kidney disease, stage 3a: Secondary | ICD-10-CM | POA: Insufficient documentation

## 2020-06-14 DIAGNOSIS — L89300 Pressure ulcer of unspecified buttock, unstageable: Secondary | ICD-10-CM

## 2020-06-14 DIAGNOSIS — G40909 Epilepsy, unspecified, not intractable, without status epilepticus: Secondary | ICD-10-CM

## 2020-06-14 DIAGNOSIS — N179 Acute kidney failure, unspecified: Secondary | ICD-10-CM | POA: Insufficient documentation

## 2020-06-14 DIAGNOSIS — C7951 Secondary malignant neoplasm of bone: Secondary | ICD-10-CM

## 2020-06-14 DIAGNOSIS — C61 Malignant neoplasm of prostate: Secondary | ICD-10-CM

## 2020-06-14 LAB — BASIC METABOLIC PANEL
Anion gap: 10 (ref 5–15)
BUN: 90 mg/dL — ABNORMAL HIGH (ref 8–23)
CO2: 16 mmol/L — ABNORMAL LOW (ref 22–32)
Calcium: 8.2 mg/dL — ABNORMAL LOW (ref 8.9–10.3)
Chloride: 105 mmol/L (ref 98–111)
Creatinine, Ser: 3.31 mg/dL — ABNORMAL HIGH (ref 0.61–1.24)
GFR calc Af Amer: 20 mL/min — ABNORMAL LOW (ref >60)
GFR calc non Af Amer: 17 mL/min — ABNORMAL LOW (ref >60)
Glucose, Bld: 99 mg/dL (ref 70–99)
Potassium: 4.9 mmol/L (ref 3.5–5.1)
Sodium: 131 mmol/L — ABNORMAL LOW (ref 135–145)

## 2020-06-14 LAB — CBC WITH DIFFERENTIAL/PLATELET
Abs Immature Granulocytes: 0.49 10*3/uL — ABNORMAL HIGH (ref 0.00–0.07)
Basophils Absolute: 0.1 10*3/uL (ref 0.0–0.1)
Basophils Relative: 0 %
Eosinophils Absolute: 0.1 10*3/uL (ref 0.0–0.5)
Eosinophils Relative: 1 %
HCT: 29.2 % — ABNORMAL LOW (ref 39.0–52.0)
Hemoglobin: 9.7 g/dL — ABNORMAL LOW (ref 13.0–17.0)
Immature Granulocytes: 3 %
Lymphocytes Relative: 5 %
Lymphs Abs: 0.7 10*3/uL (ref 0.7–4.0)
MCH: 29.2 pg (ref 26.0–34.0)
MCHC: 33.2 g/dL (ref 30.0–36.0)
MCV: 88 fL (ref 80.0–100.0)
Monocytes Absolute: 1.3 10*3/uL — ABNORMAL HIGH (ref 0.1–1.0)
Monocytes Relative: 9 %
Neutro Abs: 11.8 10*3/uL — ABNORMAL HIGH (ref 1.7–7.7)
Neutrophils Relative %: 82 %
Platelets: 318 10*3/uL (ref 150–400)
RBC: 3.32 MIL/uL — ABNORMAL LOW (ref 4.22–5.81)
RDW: 20.7 % — ABNORMAL HIGH (ref 11.5–15.5)
WBC: 14.6 10*3/uL — ABNORMAL HIGH (ref 4.0–10.5)
nRBC: 0 % (ref 0.0–0.2)

## 2020-06-14 LAB — GLUCOSE, CAPILLARY
Glucose-Capillary: 161 mg/dL — ABNORMAL HIGH (ref 70–99)
Glucose-Capillary: 114 mg/dL — ABNORMAL HIGH (ref 70–99)
Glucose-Capillary: 111 mg/dL — ABNORMAL HIGH (ref 70–99)
Glucose-Capillary: 135 mg/dL — ABNORMAL HIGH (ref 70–99)
Glucose-Capillary: 135 mg/dL — ABNORMAL HIGH (ref 70–99)

## 2020-06-14 LAB — AEROBIC CULTURE W GRAM STAIN (SUPERFICIAL SPECIMEN): Culture: NORMAL

## 2020-06-14 LAB — PHOSPHORUS: Phosphorus: 5.4 mg/dL — ABNORMAL HIGH (ref 2.5–4.6)

## 2020-06-14 LAB — MAGNESIUM: Magnesium: 1.7 mg/dL (ref 1.7–2.4)

## 2020-06-14 MED ORDER — SODIUM CHLORIDE 0.9 % IV SOLN
3.0000 g | Freq: Two times a day (BID) | INTRAVENOUS | Status: DC
  Administered 2020-06-14 – 2020-06-15 (×2): 3 g via INTRAVENOUS
  Filled 2020-06-14: qty 8
  Filled 2020-06-14 (×2): qty 3

## 2020-06-14 MED ORDER — SODIUM CHLORIDE 0.9 % IV SOLN: 2.0000 g | INTRAVENOUS | Status: DC

## 2020-06-14 MED ORDER — COLLAGENASE 250 UNIT/GM EX OINT
TOPICAL_OINTMENT | Freq: Two times a day (BID) | CUTANEOUS | Status: DC
  Filled 2020-06-14: qty 30

## 2020-06-14 MED ORDER — POLYETHYLENE GLYCOL 3350 17 G PO PACK
17.0000 g | PACK | Freq: Every day | ORAL | Status: DC
  Administered 2020-06-15: 17 g via ORAL
  Filled 2020-06-14 (×2): qty 1

## 2020-06-14 MED ORDER — SODIUM CHLORIDE 0.9 % IV SOLN
1.0000 g | Freq: Once | INTRAVENOUS | Status: DC
  Filled 2020-06-14: qty 1

## 2020-06-14 MED ORDER — SENNOSIDES-DOCUSATE SODIUM 8.6-50 MG PO TABS
1.0000 | ORAL_TABLET | Freq: Two times a day (BID) | ORAL | Status: DC
  Administered 2020-06-14 – 2020-06-15 (×3): 1 via ORAL
  Filled 2020-06-14 (×5): qty 1

## 2020-06-14 NOTE — Assessment & Plan Note (Signed)
-  Continue hydrotherapy until discharge per surgery -Continue Santyl with twice daily dressing changes -Per surgery, no surgical debridement at this time

## 2020-06-14 NOTE — Hospital Course (Addendum)
[name] is a 74 yo CM with PMH DMII, CAD s/p CABG, PAD, HTN, HLD, CVA (residual left side weakness), newly diagnosed prostate cancer with bone mets who presented to the hospital on 06/11/20 with fevers and not feeling well for approximately 1 week. He was hypotensive and ultimately considered to be in septic shock from an underlying perirectal wound/abscess that was noted on CT abdomen/pelvis on admission.  There was no other obvious source of infection after ongoing work-up. He does also have a chronic indwelling Foley catheter. He was started on hydrotherapy for his gluteal wound.  Surgery was also consulted and assisted during hospitalization.  He was not felt to require surgical debridement and was recommended to continue hydrotherapy until discharge.  Santyl was ordered for enzymatic debridement with twice daily dressing changes. He ultimately stabilized and was able to come off of vasopressor support and his sepsis resolved.  Service was transferred to [facility] from [facility] on 06/14/2020 to continue care.  He continued to improve and was continued on hydrotherapy during hospitalization.  His mobility status at baseline is bedbound.  He has good care at home from his ex-wife who plans to continue ongoing wound care and overall care at discharge.  She was educated regarding dressing changes by staff prior to discharge and was comfortable doing so.  He will complete 10 more days of Augmentin at discharge.  He will also follow-up with wound care center at discharge as well. He also plans to follow-up with urology for starting androgen deprivation therapy.

## 2020-06-14 NOTE — Progress Notes (Signed)
[facility] Progress Note     Subjective: Pt seen with PT hydrotherapy. Patient reports he is feeling much better.   Objective: Vital signs in last 24 hours: Temp:  [97.4 F (36.3 C)-97.8 F (36.6 C)] 97.5 F (36.4 C) (09/02 0909) Pulse Rate:  [72-85] 84 (09/02 0600) Resp:  [16-22] 20 (09/02 0600) BP: (117-164)/(38-73) 140/46 (09/02 0600) SpO2:  [91 %-96 %] 91 % (09/02 0600) Arterial Line BP: (125-138)/(41-52) 138/52 (09/01 1248) Weight:  [81.9 kg] 81.9 kg (09/02 0500) Last BM Date: 06/12/20  Intake/Output from previous day: 09/01 0701 - 09/02 0700 In: 2140.6 [P.O.:360; I.V.:1580.6; IV Piggyback:200] Out: 4400 [Urine:4400] Intake/Output this shift: No intake/output data recorded.  PE: General: pleasant, chronically ill appearing male Heart: regular, rate, and rhythm. Palpable radial and pedal pulses bilaterally Lungs: Respiratory effort nonlabored Abd: soft, NT, ND GU: Gluteal wound with some stable necrosis as seen below, no pockets of purulent material      Lab Results:  Recent Labs    06/13/20 0906 06/14/20 0930  WBC 16.9* 14.6*  HGB 8.9* 9.7*  HCT 25.8* 29.2*  PLT 224 318   BMET Recent Labs    06/13/20 0302 06/14/20 0236  NA 124* 131*  K 4.7 4.9  CL 96* 105  CO2 16* 16*  GLUCOSE 143* 99  BUN 104* 90*  CREATININE 4.47* 3.31*  CALCIUM 8.1* 8.2*   PT/INR Recent Labs    06/11/20 1434 06/12/20 0656  LABPROT 14.2 14.8  INR 1.1 1.2   CMP     Component Value Date/Time   NA 131 (L) 06/14/2020 0236   NA 141 08/18/2018 1529   K 4.9 06/14/2020 0236   CL 105 06/14/2020 0236   CO2 16 (L) 06/14/2020 0236   GLUCOSE 99 06/14/2020 0236   BUN 90 (H) 06/14/2020 0236   BUN 40 (H) 08/18/2018 1529   CREATININE 3.31 (H) 06/14/2020 0236   CREATININE 1.27 03/04/2018 1434   CREATININE 1.11 07/13/2013 0814   CALCIUM 8.2 (L) 06/14/2020 0236   PROT 6.2 (L) 06/11/2020 1434   PROT 6.9 10/08/2017 0820   ALBUMIN 2.4 (L) 06/11/2020 1816   ALBUMIN  4.4 10/08/2017 0820   AST 18 06/11/2020 1434   AST 22 03/04/2018 1434   ALT 7 06/11/2020 1434   ALT 6 03/04/2018 1434   ALKPHOS 91 06/11/2020 1434   BILITOT 0.9 06/11/2020 1434   BILITOT <0.2 (L) 03/04/2018 1434   GFRNONAA 17 (L) 06/14/2020 0236   GFRNONAA 54 (L) 03/04/2018 1434   GFRAA 20 (L) 06/14/2020 0236   GFRAA >60 03/04/2018 1434   Lipase     Component Value Date/Time   LIPASE 132 (H) 05/23/2011 0231       Studies/Results: ECHOCARDIOGRAM COMPLETE  Result Date: 06/13/2020    ECHOCARDIOGRAM REPORT   Patient Name:   [name] Date of Exam: 06/13/2020 Medical Rec #:  [id number]       Height:       64.0 in Accession #:    [id number]      Weight:       175.0 lb Date of Birth:  April 15, 1946       BSA:          1.849 m Patient Age:    1 years        BP:           95/44 mmHg Patient Gender: M               HR:  72 bpm. Exam Location:  Inpatient Procedure: 2D Echo, 3D Echo and Strain Analysis Indications:    Cardiomyopathy-Unspecified 425.9 / I42.9  History:        Patient has prior history of Echocardiogram examinations. CHF,                 CAD and Previous Myocardial Infarction, Prior CABG, PAD,                 Signs/Symptoms:Hypotension; Risk Factors:Hypertension and                 Dyslipidemia. Septic shock, Acute kidney injury, Anemia,                 Abscess.  Sonographer:    Darlina Sicilian RDCS Referring Phys: [id number] [place]  1. Left ventricular ejection fraction, by estimation, is 60 to 65%. The left ventricle has normal function. The left ventricle has no regional wall motion abnormalities. Left ventricular diastolic parameters are consistent with Grade II diastolic dysfunction (pseudonormalization). Elevated left atrial pressure.  2. Right ventricular systolic function is normal. The right ventricular size is normal. There is mildly elevated pulmonary artery systolic pressure. The estimated right ventricular systolic pressure is 34.7 mmHg.  3. Left  atrial size was moderately dilated.  4. The mitral valve is normal in structure. Mild mitral valve regurgitation. No evidence of mitral stenosis.  5. Tricuspid valve regurgitation is mild to moderate.  6. The aortic valve is normal in structure. Aortic valve regurgitation is not visualized. Mild aortic valve sclerosis is present, with no evidence of aortic valve stenosis.  7. The inferior vena cava is normal in size with greater than 50% respiratory variability, suggesting right atrial pressure of 3 mmHg. FINDINGS  Left Ventricle: Left ventricular ejection fraction, by estimation, is 60 to 65%. The left ventricle has normal function. The left ventricle has no regional wall motion abnormalities. The left ventricular internal cavity size was normal in size. There is  no left ventricular hypertrophy. Abnormal (paradoxical) septal motion consistent with post-operative status. Left ventricular diastolic parameters are consistent with Grade II diastolic dysfunction (pseudonormalization). Elevated left atrial pressure. Right Ventricle: The right ventricular size is normal. No increase in right ventricular wall thickness. Right ventricular systolic function is normal. There is mildly elevated pulmonary artery systolic pressure. The tricuspid regurgitant velocity is 3.11  m/s, and with an assumed right atrial pressure of 3 mmHg, the estimated right ventricular systolic pressure is 42.5 mmHg. Left Atrium: Left atrial size was moderately dilated. Right Atrium: Right atrial size was normal in size. Pericardium: There is no evidence of pericardial effusion. Mitral Valve: The mitral valve is normal in structure. Normal mobility of the mitral valve leaflets. Mild mitral valve regurgitation. No evidence of mitral valve stenosis. Tricuspid Valve: The tricuspid valve is normal in structure. Tricuspid valve regurgitation is mild to moderate. No evidence of tricuspid stenosis. Aortic Valve: The aortic valve is normal in structure.  Aortic valve regurgitation is not visualized. Mild aortic valve sclerosis is present, with no evidence of aortic valve stenosis. Pulmonic Valve: The pulmonic valve was normal in structure. Pulmonic valve regurgitation is not visualized. No evidence of pulmonic stenosis. Aorta: The aortic root is normal in size and structure. Venous: The inferior vena cava is normal in size with greater than 50% respiratory variability, suggesting right atrial pressure of 3 mmHg. IAS/Shunts: No atrial level shunt detected by color flow Doppler.  LEFT VENTRICLE PLAX 2D LVIDd:  4.60 cm  Diastology LVIDs:         2.80 cm  LV e' lateral:   9.36 cm/s LV PW:         0.90 cm  LV E/e' lateral: 12.7 LV IVS:        1.10 cm  LV e' medial:    6.09 cm/s LVOT diam:     2.10 cm  LV E/e' medial:  19.5 LV SV:         74 LV SV Index:   40 LVOT Area:     3.46 cm                          3D Volume EF:                         3D EF:        66 %                         LV EDV:       129 ml                         LV ESV:       43 ml                         LV SV:        85 ml RIGHT VENTRICLE RV S prime:     11.90 cm/s TAPSE (M-mode): 1.5 cm LEFT ATRIUM             Index       RIGHT ATRIUM           Index LA diam:        4.80 cm 2.60 cm/m  RA Area:     10.20 cm LA Vol (A2C):   55.3 ml 29.91 ml/m RA Volume:   15.60 ml  8.44 ml/m LA Vol (A4C):   48.1 ml 26.02 ml/m LA Biplane Vol: 52.2 ml 28.24 ml/m  AORTIC VALVE LVOT Vmax:   95.10 cm/s LVOT Vmean:  64.800 cm/s LVOT VTI:    0.213 m  AORTA Ao Root diam: 3.10 cm MITRAL VALVE                TRICUSPID VALVE MV Area (PHT): 4.49 cm     TR Peak grad:   38.7 mmHg MV Decel Time: 169 msec     TR Vmax:        311.00 cm/s MV E velocity: 119.00 cm/s MV A velocity: 63.00 cm/s   SHUNTS MV E/A ratio:  1.89         Systemic VTI:  0.21 m                             Systemic Diam: 2.10 cm Dani Gobble Croitoru MD Electronically signed by Sanda Klein MD Signature Date/Time: 06/13/2020/12:56:22 PM    Final      Anti-infectives: Anti-infectives (From admission, onward)   Start     Dose/Rate Route Frequency Ordered Stop   06/15/20 0600  ceFEPIme (MAXIPIME) 2 g in sodium chloride 0.9 % 100 mL IVPB  Status:  Discontinued        2 g 200 mL/hr over 30 Minutes Intravenous Every 24 hours 06/14/20 0904 06/14/20 1026  06/14/20 1200  ceFEPIme (MAXIPIME) 1 g in sodium chloride 0.9 % 100 mL IVPB  Status:  Discontinued        1 g 200 mL/hr over 30 Minutes Intravenous  Once 06/14/20 0904 06/14/20 1026   06/12/20 0400  ceFEPIme (MAXIPIME) 1 g in sodium chloride 0.9 % 100 mL IVPB  Status:  Discontinued        1 g 200 mL/hr over 30 Minutes Intravenous Every 24 hours 06/11/20 1954 06/14/20 0904   06/11/20 2200  metroNIDAZOLE (FLAGYL) IVPB 500 mg  Status:  Discontinued        500 mg 100 mL/hr over 60 Minutes Intravenous Every 8 hours 06/11/20 1942 06/13/20 0847   06/11/20 1952  vancomycin variable dose per unstable renal function (pharmacist dosing)  Status:  Discontinued         Does not apply See admin instructions 06/11/20 1952 06/14/20 1026   06/11/20 1800  vancomycin (VANCOREADY) IVPB 1500 mg/300 mL        1,500 mg 150 mL/hr over 120 Minutes Intravenous  Once 06/11/20 1735 06/11/20 2207   06/11/20 1545  piperacillin-tazobactam (ZOSYN) IVPB 3.375 g        3.375 g 100 mL/hr over 30 Minutes Intravenous  Once 06/11/20 1542 06/11/20 1728       Assessment/Plan CVA with left sided weakness Hx of MI s/p CABGx4 hemiparkinsonism on the right HTN HLD T2DM - on insulin gtt Hx of seizure disorder  Septic shock- WBC 14 from 25, off pressors, improving  L gluteal wound - CT 8/30 shows wound, no undrained collection, perinephric stranding - culture reincubated for better growth - continue hydrotherapy - add santyl for enzymatic debridement as well, continue BID dressing changes or prn if soiled - wound appears almost more like a pressure injury rather than an abscess, I do not think this is source of  septic shock - no indication for surgical debridement at this time, we will sign off. Can stop hydrotherapy when patient medically stable for discharge. - recommend follow up in the wound care center     LOS: 3 days    Norm Parcel , [facility] 06/14/2020, 10:26 AM Please see Amion for pager number during day hours 7:00am-4:30pm

## 2020-06-14 NOTE — Progress Notes (Signed)
   06/14/20  Hydrotherapy  Note. [id number]   Subjective Assessment  Subjective doing  OK  Patient and Family Stated Goals to go home  Date of Onset  (present)  Prior Treatments dress change at home by caregiver  Evaluation and Treatment  Evaluation and Treatment Procedures Explained to Patient/Family Yes  Evaluation and Treatment Procedures agreed to  Pressure Injury 06/12/20 Rectum Left Unstageable - Full thickness tissue loss in which the base of the injury is covered by slough (yellow, tan, gray, green or brown) and/or eschar (tan, brown or black) in the wound bed. PT only- perirectal open wound wi  Date First Assessed/Time First Assessed: 06/12/20 1406   Location: Rectum  Location Orientation: Left  Staging: Unstageable - Full thickness tissue loss in which the base of the injury is covered by slough (yellow, tan, gray, green or brown) and/or es...  Dressing Type Silicone dressing;Moist to moist  Dressing Changed  Dressing Change Frequency PRN (when contaminated)  State of Healing Eschar  Site / Wound Assessment Brown  % Wound base Red or Granulating 0%  % Wound base Black/Eschar 100%  Peri-wound Assessment Maceration  Wound Length (cm) 4.5 cm  Wound Width (cm) 3.5 cm  Wound Depth (cm) 2 cm  Wound Surface Area (cm^2) 15.75 cm^2  Wound Volume (cm^3) 31.5 cm^3  Tunneling (cm) 4.5 (at 5:00)  Undermining (cm) 2  Margins Unattached edges (unapproximated)  Drainage Amount Minimal  Drainage Description Serosanguineous;No odor  Treatment Hydrotherapy (Pulse lavage);Packing (Saline gauze)  Hydrotherapy  Pulsed lavage therapy - wound location left of rectum  Pulsed Lavage with Suction (psi) 4 psi  Pulsed Lavage with Suction - Normal Saline Used 1000 mL  Pulsed Lavage Tip Tip with splash shield  Wound Therapy - Assess/Plan/Recommendations  Wound Therapy - Clinical Statement Patient tolerated well, Patient will benefit from a PT consult to assess mobility. Patient has multiple mets  to pelvis, spine, ribs so unsure if there are any precautions.  Continue HYdro therapy.. If patient DC's home, recommend  low air loss mattress for his hospital bed. Has a WC. Surgery PA in and will add Santyl  Wound Therapy - Functional Problem List metastatic bladder cancer  with mets to bone:ribs, pelvis, spine., H/O seizure D/O. Paatient tolerates hydro treatment, noted small excoriated periwound bleeding. Patient does have some discomfort with debridement so minimized. Patient's caregiver present.  Factors Delaying/Impairing Wound Healing Diabetes Mellitus;Infection - systemic/local;Multiple medical problems;Incontinence;Altered sensation  Hydrotherapy Plan Dressing change;Patient/family education;Pulsatile lavage with suction  Wound Therapy - Frequency 6X / week  Wound Therapy - Follow Up Recommendations Skilled nursing facility  Wound Plan hydrotherapy  Wound Therapy Goals - Improve the function of patient's integumentary system by progressing the wound(s) through the phases of wound healing by:  Decrease Necrotic Tissue to 50  Decrease Necrotic Tissue - Progress Progressing toward goal  Increase Granulation Tissue to 50  Increase Granulation Tissue - Progress Progressing toward goal  Improve Drainage Characteristics Min  Improve Drainage Characteristics - Progress Progressing toward goal  Additional Wound Therapy Goal reposition off left gluteal area  Additional Wound Therapy Goal - Progress Progressing toward goal  Goals/treatment plan/discharge plan were made with and agreed upon by patient/family Yes  Time For Goal Achievement 2 weeks  [place] Pager [phone] Office [phone]

## 2020-06-14 NOTE — Assessment & Plan Note (Signed)
-  Residual left-sided weakness with clenching of left fist

## 2020-06-14 NOTE — Consult Note (Signed)
Patient seen and examined.  Please see full note by Altamese Dilling, NP.  Is a 74 year old man diagnosed with prostate cancer after presenting with a PSA of 107.  He underwent a biopsy under the care of Dr. Lovena Neighbours and showed a Gleason score 4+5 = 9 with high-volume disease including all 12 cores.  Staging work-up including a bone scan on June 06, 2020 showed diffuse metastasis.  He was hospitalized on August 30 after presenting with sepsis from perirectal abscess and acute renal failure.  His clinical status has improved and appears to be hemodynamically stable.  On exam, he was awake alert.  Without any distress but overall debilitated.  Heart was regular rate and rhythm clear lungs.  Abdomen soft without any hepatosplenomegaly.  Trace edema noted bilaterally.   Assessment and plan:  74 year old man with advanced prostate cancer and disease to the bone diagnosed in July 2021.  He had a Gleason score 4+5 = 9, PSA 107 with bone metastasis.   The natural course of this disease was reviewed today with him and his caregiver that was accompanied him today.  He understands this disease is incurable but can be treated and palliative for a period of time.  He does have multiple comorbid conditions and likely will require some time for recovery but his disease can be palliated for a period of time using androgen deprivation therapy alone.  He is scheduled to have that done under the care of Dr. Lovena Neighbours under the care of [facility] in the next 2 weeks.  I have no objections to seeking treatment for his prostate cancer with hormone therapy alone which can offer him disease control and decrease his bone pain which could manifest in the future.  He also understands that his disease control with androgen deprivation will likely be short-lived but could last up to double-digit months which is a reasonable strategy.  No additional oncology input or follow-up is needed at this time.  Dr. Lovena Neighbours feels that  oncology is needed he will make the appropriate referral as an outpatient.  Please call with any questions.

## 2020-06-14 NOTE — Progress Notes (Signed)
PROGRESS NOTE    [name]   RCV:[id number]  DOB: September 03, 1946  DOA: 06/11/2020     3  PCP: [name], MD  CC: Fevers and malaise  Hospital Course: [name] is a 74 yo CM with PMH DMII, CAD s/p CABG, PAD, HTN, HLD, CVA (residual left side weakness), newly diagnosed prostate cancer with bone mets who presented to the hospital on 06/11/20 with fevers and not feeling well for approximately 1 week. He was hypotensive and ultimately considered to be in septic shock from an underlying perirectal wound/abscess that was noted on CT abdomen/pelvis on admission.  There was no other obvious source of infection after ongoing work-up. He does also have a chronic indwelling Foley catheter. He was started on hydrotherapy for his gluteal wound.  Surgery was also consulted and assisted during hospitalization.  He was not felt to require surgical debridement and was recommended to continue hydrotherapy until discharge.  Santyl was ordered for enzymatic debridement with twice daily dressing changes. He ultimately stabilized and was able to come off of vasopressor support and his sepsis resolved.  Service was transferred to [facility] from [facility] on 06/14/2020 to continue care.   Interval History:  Seen this morning in the ICU resting in bed awake, alert, and in no distress.  Discussed events of his hospitalization thus far.  He seemed to have some confusion regarding outpatient treatment for his metastatic prostate cancer and he was amenable for second opinion from oncology while hospitalized.  He has also been following with palliative care for goals of care discussions.  Old records reviewed in assessment of this patient  ROS: Constitutional: positive for fatigue, Respiratory: negative for cough, Cardiovascular: negative for chest pain and Gastrointestinal: negative for abdominal pain  Assessment & Plan: Severe sepsis with septic shock ([place]) -Initially admitted to the ICU and started on vasopressor  support after fluid resuscitation.  Source considered gluteal wound given no other obvious source found -Has been treated with vancomycin, cefepime, Flagyl -Will further de-escalate down to Unasyn at this time, on 06/14/2020  Pressure ulcer of buttock, unstageable ([facility]) -Continue hydrotherapy until discharge per surgery -Continue Santyl with twice daily dressing changes -Per surgery, no surgical debridement at this time  Prostate cancer metastatic to bone [facility]) -Recent diagnosis in July 2021 -Appears to be following outpatient with urology with plans to start on androgen deprivation therapy -Also evaluated by oncology inpatient, appreciate assistance.  Plan will also be to continue on same course of treatment at discharge with no further oncology needs at this time  DKA (diabetic ketoacidoses) ([place]) -Present on admission and has since resolved -Continue SSI and CBG monitoring  AKI (acute kidney injury) ([place]) -Considered prerenal with some possible ATN on admission -Creatinine slowly improving, continue to trend  Hyponatremia - follow BMP daily  Normocytic anemia -Received 1 unit PRBC on 06/12/2020 for hemoglobin of 6 g/dL -Hemoglobin has remained stable and no further transfusions required -Daily CBC  History of stroke -Residual left-sided weakness with clenching of left fist  Seizure disorder ([facility]) -Continue Keppra  Acute on chronic diastolic CHF (congestive heart failure) ([place]) -Echo performed on 06/13/2020.  EF 60 to 65% with grade 2 diastolic dysfunction -BNP elevated on 06/11/2020 (1002) -Has not received diuresis in setting of recent septic shock however is now starting to pick up urine output, likely in diuretic phase of ATN.  Still net +6 L -Continue monitoring for any significant signs of volume overload   Antimicrobials: Zosyn 8/30>>8/30 Vancomycin 8/30>> 06/14/2020 Flagyl 8/30-8/31  Cefepime 06/12/2020>> 06/14/2020 Unasyn 06/14/2020>> current  DVT prophylaxis:  Heparin Code Status: Full Family Communication: None present Disposition Plan: Status is: Inpatient  Remains inpatient appropriate because:Unsafe d/c plan, IV treatments appropriate due to intensity of illness or inability to take PO and Inpatient level of care appropriate due to severity of illness   Dispo: The patient is from: Home              Anticipated d/c is to: Pending PT eval's              Anticipated d/c date is: > 3 days              Patient currently is not medically stable to d/c.       Objective: Blood pressure (!) 157/51, pulse 97, temperature 98 F (36.7 C), temperature source Oral, resp. rate 20, height 5\' 4"  (1.626 m), weight 81.9 kg, SpO2 96 %.  Examination: General appearance: Chronically ill-appearing elderly man laying in bed comfortable and in no obvious distress Head: Normocephalic, without obvious abnormality Eyes: EOMI Lungs: clear to auscultation bilaterally Heart: regular rate and rhythm and S1, S2 normal Abdomen: normal findings: bowel sounds normal and soft, non-tender Extremities: Contracted left upper extremity with left fist clenched Skin: mobility and turgor normal Neurologic: Contracted left upper extremity and difficult to assess strength but approximately 4/5.  Right upper extremity is also weak from what appears to be deconditioning but is 3+/5  Consultants:   Surgery  Nephrology  Critical care  Oncology  Data Reviewed: I have personally reviewed following labs and imaging studies Results for orders placed or performed during the hospital encounter of 06/11/20 (from the past 24 hour(s))  Glucose, capillary     Status: Abnormal   Collection Time: 06/13/20  9:56 PM  Result Value Ref Range   Glucose-Capillary 114 (H) 70 - 99 mg/dL  Basic metabolic panel     Status: Abnormal   Collection Time: 06/14/20  2:36 AM  Result Value Ref Range   Sodium 131 (L) 135 - 145 mmol/L   Potassium 4.9 3.5 - 5.1 mmol/L   Chloride 105 98 - 111  mmol/L   CO2 16 (L) 22 - 32 mmol/L   Glucose, Bld 99 70 - 99 mg/dL   BUN 90 (H) 8 - 23 mg/dL   Creatinine, Ser 3.31 (H) 0.61 - 1.24 mg/dL   Calcium 8.2 (L) 8.9 - 10.3 mg/dL   GFR calc non Af Amer 17 (L) >60 mL/min   GFR calc Af Amer 20 (L) >60 mL/min   Anion gap 10 5 - 15  Glucose, capillary     Status: Abnormal   Collection Time: 06/14/20  7:50 AM  Result Value Ref Range   Glucose-Capillary 111 (H) 70 - 99 mg/dL  CBC with Differential/Platelet     Status: Abnormal   Collection Time: 06/14/20  9:30 AM  Result Value Ref Range   WBC 14.6 (H) 4.0 - 10.5 K/uL   RBC 3.32 (L) 4.22 - 5.81 MIL/uL   Hemoglobin 9.7 (L) 13.0 - 17.0 g/dL   HCT 29.2 (L) 39 - 52 %   MCV 88.0 80.0 - 100.0 fL   MCH 29.2 26.0 - 34.0 pg   MCHC 33.2 30.0 - 36.0 g/dL   RDW 20.7 (H) 11.5 - 15.5 %   Platelets 318 150 - 400 K/uL   nRBC 0.0 0.0 - 0.2 %   Neutrophils Relative % 82 %   Neutro Abs 11.8 (H) 1.7 - 7.7 K/uL  Lymphocytes Relative 5 %   Lymphs Abs 0.7 0.7 - 4.0 K/uL   Monocytes Relative 9 %   Monocytes Absolute 1.3 (H) 0 - 1 K/uL   Eosinophils Relative 1 %   Eosinophils Absolute 0.1 0 - 0 K/uL   Basophils Relative 0 %   Basophils Absolute 0.1 0 - 0 K/uL   Immature Granulocytes 3 %   Abs Immature Granulocytes 0.49 (H) 0.00 - 0.07 K/uL  Magnesium     Status: None   Collection Time: 06/14/20  9:30 AM  Result Value Ref Range   Magnesium 1.7 1.7 - 2.4 mg/dL  Phosphorus     Status: Abnormal   Collection Time: 06/14/20  9:30 AM  Result Value Ref Range   Phosphorus 5.4 (H) 2.5 - 4.6 mg/dL  Glucose, capillary     Status: Abnormal   Collection Time: 06/14/20 12:11 PM  Result Value Ref Range   Glucose-Capillary 135 (H) 70 - 99 mg/dL   Comment 1 Notify RN    Comment 2 Document in Chart     Recent Results (from the past 240 hour(s))  Culture, blood (Routine x 2)     Status: None (Preliminary result)   Collection Time: 06/11/20  2:34 PM   Specimen: BLOOD  Result Value Ref Range Status   Specimen  Description   Final    BLOOD RIGHT ANTECUBITAL Performed at [facility], [place] [address], [place], [place] [address]    Special Requests   Final    BOTTLES DRAWN AEROBIC AND ANAEROBIC Blood Culture adequate volume Performed at [place] [address] [place], [place] [address]    Culture   Final    NO GROWTH 3 DAYS Performed at [facility], [place] [address] [place], [place] [address]    Report Status PENDING  Incomplete  Culture, blood (Routine x 2)     Status: None (Preliminary result)   Collection Time: 06/11/20  2:35 PM   Specimen: BLOOD RIGHT FOREARM  Result Value Ref Range Status   Specimen Description   Final    BLOOD RIGHT FOREARM Performed at [place] [address] [place], [place] [address]    Special Requests   Final    BOTTLES DRAWN AEROBIC AND ANAEROBIC Blood Culture adequate volume Performed at [place] [address] [place], [place] [address]    Culture   Final    NO GROWTH 3 DAYS Performed at [facility], [place] [address] [place], [place] [address]    Report Status PENDING  Incomplete  Urine culture     Status: Abnormal   Collection Time: 06/11/20  3:19 PM   Specimen: Urine, Catheterized  Result Value Ref Range Status   Specimen Description   Final    URINE, CATHETERIZED Performed at [place] [address], [place], [place] [address]    Special Requests   Final    NONE Performed at [facility], [place] [address], [place], [place] [address]    Culture (A)  Final    40,000 COLONIES/mL YEAST Performed at [facility], [place] [address], [place], [place] [address]    Report Status 06/13/2020 FINAL  Final  SARS Coronavirus 2 by RT PCR (hospital order, performed in [facility] hospital lab) Nasopharyngeal Nasopharyngeal Swab     Status: None   Collection Time: 06/11/20  3:42 PM   Specimen: Nasopharyngeal Swab   Result Value Ref  Range Status   SARS Coronavirus 2 NEGATIVE NEGATIVE Final    Comment: (NOTE) SARS-CoV-2 target nucleic acids are NOT DETECTED.  The SARS-CoV-2 RNA is generally detectable in upper and lower respiratory specimens during the acute phase of infection. The lowest concentration of SARS-CoV-2 viral copies this assay can detect is 250 copies / mL. A negative result does not preclude SARS-CoV-2 infection and should not be used as the sole basis for treatment or other patient management decisions.  A negative result may occur with improper specimen collection / handling, submission of specimen other than nasopharyngeal swab, presence of viral mutation(s) within the areas targeted by this assay, and inadequate number of viral copies (<250 copies / mL). A negative result must be combined with clinical observations, patient history, and epidemiological information.  Fact Sheet for Patients:   StrictlyIdeas.no  Fact Sheet for Healthcare Providers: BankingDealers.co.za  This test is not yet approved or  cleared by the [place] FDA and has been authorized for detection and/or diagnosis of SARS-CoV-2 by FDA under an Emergency Use Authorization (EUA).  This EUA will remain in effect (meaning this test can be used) for the duration of the COVID-19 declaration under Section 564(b)(1) of the Act, 21 U.S.C. section 360bbb-3(b)(1), unless the authorization is terminated or revoked sooner.  Performed at [facility], [place] [address], [place], [place] [address]   Aerobic Culture (superficial specimen)     Status: None   Collection Time: 06/11/20  5:24 PM   Specimen: Wound  Result Value Ref Range Status   Specimen Description   Final    WOUND PERIANUS Performed at [place] [address] [place], [place] [address]    Special Requests   Final    NONE Performed at [facility], [place] [address], [place], [place] [address]    Gram Stain   Final    FEW WBC PRESENT, PREDOMINANTLY PMN FEW GRAM POSITIVE COCCI IN CLUSTERS FEW GRAM NEGATIVE RODS    Culture   Final    NORMAL SKIN FLORA NO STAPHYLOCOCCUS AUREUS ISOLATED NO GROUP A STREP (S.PYOGENES) ISOLATED Performed at [facility], [address] [place], [place] [address]    Report Status 06/14/2020 FINAL  Final  MRSA PCR Screening     Status: None   Collection Time: 06/13/20  9:19 AM   Specimen: Nasal Mucosa; Nasopharyngeal  Result Value Ref Range Status   MRSA by PCR NEGATIVE NEGATIVE Final    Comment:        The GeneXpert MRSA Assay (FDA approved for NASAL specimens only), is one component of a comprehensive MRSA colonization surveillance program. It is not intended to diagnose MRSA infection nor to guide or monitor treatment for MRSA infections. Performed at [facility], [place] [address] [place],  [address]      Radiology Studies: ECHOCARDIOGRAM COMPLETE  Result Date: 06/13/2020    ECHOCARDIOGRAM REPORT   Patient Name:   OWYNN MOSQUEDA Date of Exam: 06/13/2020 Medical Rec #:  [id number]       Height:       64.0 in Accession #:    [id number]      Weight:       175.0 lb Date of Birth:  [birth year]       BSA:          1.849 m Patient Age:    46 years        BP:  95/44 mmHg Patient Gender: M               HR:           72 bpm. Exam Location:  Inpatient Procedure: 2D Echo, 3D Echo and Strain Analysis Indications:    Cardiomyopathy-Unspecified 425.9 / I42.9  History:        Patient has prior history of Echocardiogram examinations. CHF,                 CAD and Previous Myocardial Infarction, Prior CABG, PAD,                 Signs/Symptoms:Hypotension; Risk Factors:Hypertension and                 Dyslipidemia. Septic shock, Acute kidney injury, Anemia,                 Abscess.  Sonographer:    Darlina Sicilian RDCS Referring Phys: [id number] [place]   1. Left ventricular ejection fraction, by estimation, is 60 to 65%. The left ventricle has normal function. The left ventricle has no regional wall motion abnormalities. Left ventricular diastolic parameters are consistent with Grade II diastolic dysfunction (pseudonormalization). Elevated left atrial pressure.  2. Right ventricular systolic function is normal. The right ventricular size is normal. There is mildly elevated pulmonary artery systolic pressure. The estimated right ventricular systolic pressure is 45.4 mmHg.  3. Left atrial size was moderately dilated.  4. The mitral valve is normal in structure. Mild mitral valve regurgitation. No evidence of mitral stenosis.  5. Tricuspid valve regurgitation is mild to moderate.  6. The aortic valve is normal in structure. Aortic valve regurgitation is not visualized. Mild aortic valve sclerosis is present, with no evidence of aortic valve stenosis.  7. The inferior vena cava is normal in size with greater than 50% respiratory variability, suggesting right atrial pressure of 3 mmHg. FINDINGS  Left Ventricle: Left ventricular ejection fraction, by estimation, is 60 to 65%. The left ventricle has normal function. The left ventricle has no regional wall motion abnormalities. The left ventricular internal cavity size was normal in size. There is  no left ventricular hypertrophy. Abnormal (paradoxical) septal motion consistent with post-operative status. Left ventricular diastolic parameters are consistent with Grade II diastolic dysfunction (pseudonormalization). Elevated left atrial pressure. Right Ventricle: The right ventricular size is normal. No increase in right ventricular wall thickness. Right ventricular systolic function is normal. There is mildly elevated pulmonary artery systolic pressure. The tricuspid regurgitant velocity is 3.11  m/s, and with an assumed right atrial pressure of 3 mmHg, the estimated right ventricular systolic pressure is 09.8 mmHg. Left  Atrium: Left atrial size was moderately dilated. Right Atrium: Right atrial size was normal in size. Pericardium: There is no evidence of pericardial effusion. Mitral Valve: The mitral valve is normal in structure. Normal mobility of the mitral valve leaflets. Mild mitral valve regurgitation. No evidence of mitral valve stenosis. Tricuspid Valve: The tricuspid valve is normal in structure. Tricuspid valve regurgitation is mild to moderate. No evidence of tricuspid stenosis. Aortic Valve: The aortic valve is normal in structure. Aortic valve regurgitation is not visualized. Mild aortic valve sclerosis is present, with no evidence of aortic valve stenosis. Pulmonic Valve: The pulmonic valve was normal in structure. Pulmonic valve regurgitation is not visualized. No evidence of pulmonic stenosis. Aorta: The aortic root is normal in size and structure. Venous: The inferior vena cava is normal in size with greater than 50% respiratory  variability, suggesting right atrial pressure of 3 mmHg. IAS/Shunts: No atrial level shunt detected by color flow Doppler.  LEFT VENTRICLE PLAX 2D LVIDd:         4.60 cm  Diastology LVIDs:         2.80 cm  LV e' lateral:   9.36 cm/s LV PW:         0.90 cm  LV E/e' lateral: 12.7 LV IVS:        1.10 cm  LV e' medial:    6.09 cm/s LVOT diam:     2.10 cm  LV E/e' medial:  19.5 LV SV:         74 LV SV Index:   40 LVOT Area:     3.46 cm                          3D Volume EF:                         3D EF:        66 %                         LV EDV:       129 ml                         LV ESV:       43 ml                         LV SV:        85 ml RIGHT VENTRICLE RV S prime:     11.90 cm/s TAPSE (M-mode): 1.5 cm LEFT ATRIUM             Index       RIGHT ATRIUM           Index LA diam:        4.80 cm 2.60 cm/m  RA Area:     10.20 cm LA Vol (A2C):   55.3 ml 29.91 ml/m RA Volume:   15.60 ml  8.44 ml/m LA Vol (A4C):   48.1 ml 26.02 ml/m LA Biplane Vol: 52.2 ml 28.24 ml/m  AORTIC VALVE LVOT  Vmax:   95.10 cm/s LVOT Vmean:  64.800 cm/s LVOT VTI:    0.213 m  AORTA Ao Root diam: 3.10 cm MITRAL VALVE                TRICUSPID VALVE MV Area (PHT): 4.49 cm     TR Peak grad:   38.7 mmHg MV Decel Time: 169 msec     TR Vmax:        311.00 cm/s MV E velocity: 119.00 cm/s MV A velocity: 63.00 cm/s   SHUNTS MV E/A ratio:  1.89         Systemic VTI:  0.21 m                             Systemic Diam: 2.10 cm Dani Gobble Croitoru MD Electronically signed by Sanda Klein MD Signature Date/Time: 06/13/2020/12:56:22 PM    Final    CT ABDOMEN PELVIS WO CONTRAST  Final Result  Addendum 1 of 1  ADDENDUM REPORT: 06/11/2020 21:43    ADDENDUM:  Few borderline enlarged retroperitoneal lymph nodes, stable to  decreased in size from comparison imaging.      Electronically Signed    By: Lovena Le M.D.    On: 06/11/2020 21:43      Final    DG Chest [facility]  Final Result    DG Chest [facility] 1 [facility]  Final Result      Scheduled Meds: . carbidopa-levodopa  2 tablet Oral TID  . chlorhexidine  15 mL Mouth Rinse BID  . Chlorhexidine Gluconate Cloth  6 each Topical Daily  . collagenase   Topical BID  . dantrolene  50 mg Oral BID  . dipyridamole-aspirin  1 capsule Oral BID  . heparin  5,000 Units Subcutaneous Q8H  . insulin aspart  0-5 Units Subcutaneous QHS  . insulin aspart  0-9 Units Subcutaneous TID WC  . insulin aspart  3 Units Subcutaneous TID WC  . insulin detemir  0.3 Units/kg Subcutaneous Q24H  . levETIRAcetam  500 mg Oral Daily  . mouth rinse  15 mL Mouth Rinse q12n4p  . polyethylene glycol  17 g Oral Daily  . senna-docusate  1 tablet Oral BID   PRN Meds: dextrose, oxyCODONE-acetaminophen, polyethylene glycol Continuous Infusions: . sodium chloride Stopped (06/12/20 0635)  . sodium chloride 75 mL/hr at 06/14/20 1828  . ampicillin-sulbactam (UNASYN) IV Stopped (06/14/20 1508)      LOS: 3 days  Time spent: Greater than 50% of the 35 minute visit was spent in counseling/coordination  of care for the patient as laid out in the A&P.   Dwyane Dee, MD [facility] 06/14/2020, 6:40 PM  Contact via secure chat.  To contact the attending provider between 7A-7P or the covering provider during after hours 7P-7A, please log into the web site [email or web address] and access using universal [place] password for that web site. If you do not have the password, please call the hospital operator.

## 2020-06-14 NOTE — Assessment & Plan Note (Signed)
-  Received 1 unit PRBC on 06/12/2020 for hemoglobin of 6 g/dL -Hemoglobin has remained stable and no further transfusions required -Daily CBC

## 2020-06-14 NOTE — Consult Note (Addendum)
[place]  [phone] [phone]   MEDICAL ONCOLOGY - INITIAL CONSULTATION  Referral MD: Dr. Dwyane Dee  Reason for Referral: Metastatic prostate cancer  HPI: Mr. Kleven is a 74 year old male with a past medical history significant for recently diagnosed metastatic prostate cancer, history of bladder cancer-received intravesicular BCG treatments at [facility] urology, history of MI, CAD, history of CABG, seizure disorder, history of CVA, hypertension, diabetes, Parkinson's disease.  The patient presented to the hospital on 06/11/2020 after not feeling well for about 1 week.  He had a perirectal abscess at home which started to drain several days prior to admission.  He was having fevers up to one 1.6 at home.  On admission, he was found to be in septic shock, DKA, and had AKI.  He also had significant hyponatremia corrected sodium of 117.  The patient is now off pressors.  Remains on IV antibiotics for wound infection.  Renal function slowly improving and per nephrology, he has not a candidate for dialysis should he ever need it in the future.  Surgery is following his perirectal abscess.  He is receiving hydrotherapy and dressing changes.   The patient is followed at [facility] for his history of bladder cancer and recent diagnosis of metastatic prostate cancer.  He has been followed by Dr. Ellison Hughs.  A prostate biopsy was performed on 05/04/2020 (results scanned to epic) which was consistent with prostatic adenocarcinoma Gleason score 4+5 = 9.  The most recent PSA available to me was 107 performed on 03/08/2020 (report scanned into epic).  The patient had an outpatient bone scan performed on 06/06/2020 which showed osseous metastatic disease noted in the bilateral ribs, thoracic spine, lumbar spine, and pelvis. He had a CT of the abdomen pelvis without contrast performed this admission which showed mild prostamegaly and lobular thickening of the  seminal vesicles and multilevel sclerotic lesions throughout the included thoracolumbar spine and bony pelvis consistent with metastatic disease. It appears as though the patient has been referred to radiation oncology already and has a pending appointment.  The patient was seen today with his ex-wife, [name], who was at the bedside.  His ex-wife is his primary caregiver and has been his primary care giver since he had a stroke in 1999.  She is also his healthcare power of attorney.  The patient has been getting progressively weak over about the past 2 months.  He has anorexia and has had a weight loss of approximately 30 pounds over about the past 6 months.  He was having fevers at home, but currently afebrile.  He reports that he is feeling better today.  His ex-wife indicates that he ate a very good breakfast.  He denies headaches, dizziness, chest pain, shortness of breath.  He has a nonproductive cough.  Denies abdominal pain, nausea, vomiting, constipation, diarrhea.  He has had a Foley catheter in place since early May 2021 due to obstructive symptoms.  He has not had any lower extremity edema.  No epistaxis, hemoptysis, hematemesis, hematuria, melena, hematochezia.  Bruises easily.  The patient has a history of seizures and his last seizure was in approximately 2009.  He currently denies any bony pain.  He has left hemiplegia of his arm and leg since his stroke in 1999.  Prior to this admission, he was dependent upon his ex-wife for all ADLs.  He was able to walk very short distances at home up until a few weeks ago.  The patient is divorced.  Caregiver and healthcare power of attorney is his ex-wife, [name].  He has 4 children.  The patient is a retired Administrator.  The patient smoked 2 packs of cigarettes daily for approximately 50 years.  He quit about 12 years ago.  Denies history of alcohol use.  Medical oncology was asked to see the patient to make recommendations regarding his metastatic  prostate cancer.   Past Medical History:  Diagnosis Date  . At high risk for falls   . Bladder cancer [facility])    urologist-  dr Gaynelle Arabian  . CAD (coronary artery disease) cardiologist-  dr berry  . Cerebrovascular arteriosclerosis   . Gait disturbance, post-stroke    uses cane, rollator, and w/c long distance  . Hemiparkinsonism [facility]) neurologist-- dr Leta Baptist   right side body  . History of basal cell carcinoma excision    Sept 2016--  MOH's surgery side of nose  . History of carotid artery stenosis cardiologist-- dr berry   bilateral --  1999 s/p  bilateral ICA endarterectomy and recurrent restenosis 2012  s/p  staged bilateral stenting   per last duplex 2015  stents widely patent  . History of CVA with residual deficit neurologist-  dr Leta Baptist   3/ 1999  Right MCA  and  12/ 2004  Anterior division of  Right MCA ---  residual left spactic hemiparesis and left foot drop  . History of MI (myocardial infarction)    02/ 2004   s/p  cabg   . HOH (hard of hearing)    LEFT EAR  POST cva  . Hyperextension deformity of left knee    wears brace  . Hyperlipidemia   . Hypertension   . Left foot drop    residual from CVA  -- wears brace  . Left spastic hemiparesis ([place])    residual CVA 1999  . Pancreatic pseudocyst   . Peripheral arterial disease ([place])   . S/P CABG x 4    02/ 2004  . Seizure disorder [facility]) last seizure 2011 due to confusion   neurologist-  dr Leta Baptist-- per note seizure documented 2008 breakthrough partial complex seizure (prior tonic-clonic seizure's post CVA)  . Type 2 diabetes, diet controlled ([place])     2  . Visual neglect    LEFT EYE  :  Past Surgical History:  Procedure Laterality Date  . CARDIAC CATHETERIZATION  11/17/2002   significant left main disease and 3 vessel CAD, mildly depressed LV systolic  fx, 10% left renal artery stenosis  . CAROTID ENDARTERECTOMY  12/1997   [facility])   Bilateral ICA  . CAROTID STENT INSERTION Bilateral dr berry and dr  Kristine Royal--  (In-stent Restenosis)  LeftICA   02-12-2011;  [place]   02-26-2011  . CORONARY ARTERY BYPASS GRAFT  2/ 2004   dr hendrickson   x3 LIMA to LAD,free right internal mammary artery to obtuse marginal 1,SVG to distal right coronary.  . CYSTOSCOPY W/ RETROGRADES Bilateral 09/10/2015   Procedure: CYSTOSCOPY WITH RETROGRADE PYELOGRAM;  Surgeon: Carolan Clines, MD;  Location: [facility];  Service: Urology;  Laterality: Bilateral;  . CYSTOSCOPY WITH BIOPSY N/A 09/10/2015   Procedure: CYSTOSCOPY WITH BIOPSY,RIGHT TRIGONAL TUMOR 1.5 CM, EXCISIONAL BIOPSY WITH TAUBER FORCEP RIGHT POSTERIOR BLADDER WALL 1 CM, EXCISIONAL BIOPSY SATELITE BLADDER WALL 1 CM;  Surgeon: Carolan Clines, MD;  Location[facility];  Service: Urology;  Laterality: N/A;  . CYSTOSCOPY WITH BIOPSY N/A 03/26/2017   Procedure: CYSTOSCOPY WITH BIOPSY/;  Surgeon:  Carolan Clines, MD;  Location: [facility];  Service: Urology;  Laterality: N/A;  . CYSTOSCOPY WITH HYDRODISTENSION AND BIOPSY N/A 10/29/2015   Procedure: REPEAT CYSTOSCOPY BIOPSY BLADDER DEEP MUSCLES BLADDER BIOPSY;  Surgeon: Carolan Clines, MD;  Location: [place];  Service: Urology;  Laterality: N/A;  . CYSTOSCOPY WITH URETHRAL DILATATION N/A 09/10/2015   Procedure: CYSTOSCOPY WITH URETHRAL DILATATION;  Surgeon: Carolan Clines, MD;  Location: [place];  Service: Urology;  Laterality: N/A;  . FULGURATION OF BLADDER TUMOR N/A 03/26/2017   Procedure: FULGURATION OF BLADDER TUMOR;  Surgeon: Carolan Clines, MD;  Location: [facility];  Service: Urology;  Laterality: N/A;  . [place]  . MOHS SURGERY  06-20-2015   side of nose  . NM MYOCAR PERF WALL MOTION  01/09/2011   dr berry   mild to mod. perfusion defect in the basal inferolateral & mid inferolaterl regions consistant with an infarct/scar, no signigicant ischemia demonstracted/  Low Risk scan, no sig. change  from previous study/  normal LV function and wall motion , ef 63%  . ORIF RIGHT ANKLE FX  1998   hardware retained  . TRANSURETHRAL RESECTION OF BLADDER TUMOR N/A 09/10/2015   Procedure: CYSTO TRANSURETHRAL RESECTION OF BLADDER TUMOR (TURBT) OF LEFT POSTERIOR BLADDER TUMOR 2 CM;  Surgeon: Carolan Clines, MD;  Location: [facility];  Service: Urology;  Laterality: N/A;  . TRANSURETHRAL RESECTION OF BLADDER TUMOR Left 04/17/2016   Procedure: TRANSURETHRAL RESECTION OF BLADDER TUMOR (TURBT);  Surgeon: Carolan Clines, MD;  Location: [facility];  Service: Urology;  Laterality: Left;  . US ECHOCARDIOGRAPHY  01/03/2011   normal LVF, ef>55%/  mild LAE/ trace MR and TR,  mild AV sclerosis without stenosis  :  Current Facility-Administered Medications  Medication Dose Route Frequency Provider Last Rate Last Admin  . 0.9 %  sodium chloride infusion  250 mL Intravenous Continuous Sherrilyn Rist A, MD   Stopped at 06/12/20 [phone]  . 0.9 %  sodium chloride infusion   Intravenous Continuous Olalere, Adewale A, MD 75 mL/hr at 06/14/20 1000 Rate Verify at 06/14/20 1000  . Ampicillin-Sulbactam (UNASYN) 3 g in sodium chloride 0.9 % 100 mL IVPB  3 g Intravenous Q12H Bell, Michelle T, RPH      . carbidopa-levodopa (SINEMET IR) 25-100 MG per tablet immediate release 2 tablet  2 tablet Oral TID Sherrilyn Rist A, MD   2 tablet at 06/14/20 0950  . chlorhexidine (PERIDEX) 0.12 % solution 15 mL  15 mL Mouth Rinse BID Olalere, Adewale A, MD   15 mL at 06/14/20 1004  . Chlorhexidine Gluconate Cloth 2 % PADS 6 each  6 each Topical Daily Olalere, Adewale A, MD   6 each at 06/14/20 1004  . collagenase (SANTYL) ointment   Topical BID Delno, [place], PA-C      . dantrolene (DANTRIUM) capsule 50 mg  50 mg Oral BID Olalere, Adewale A, MD   50 mg at 06/14/20 0950  . dextrose 50 % solution 0-50 mL  0-50 mL Intravenous PRN Hayden Rasmussen, MD      . dipyridamole-aspirin (AGGRENOX) 200-25 MG per 12 hr capsule 1  capsule  1 capsule Oral BID Olalere, Adewale A, MD   1 capsule at 06/14/20 0950  . heparin injection 5,000 Units  5,000 Units Subcutaneous Q8H Olalere, Adewale A, MD   5,000 Units at 06/14/20 0542  . insulin aspart (novoLOG) injection 0-5 Units  0-5 Units Subcutaneous QHS Olalere, Adewale A, MD      .  insulin aspart (novoLOG) injection 0-9 Units  0-9 Units Subcutaneous TID WC Olalere, Adewale A, MD   2 Units at 06/13/20 1644  . insulin aspart (novoLOG) injection 3 Units  3 Units Subcutaneous TID WC Olalere, Adewale A, MD   3 Units at 06/13/20 1644  . insulin detemir (LEVEMIR) injection 23 Units  0.3 Units/kg Subcutaneous Q24H Olalere, Adewale A, MD   23 Units at 06/13/20 1155  . levETIRAcetam (KEPPRA XR) 24 hr tablet 500 mg  500 mg Oral Daily Olalere, Adewale A, MD   500 mg at 06/14/20 0950  . MEDLINE mouth rinse  15 mL Mouth Rinse q12n4p Olalere, Adewale A, MD      . oxyCODONE-acetaminophen (PERCOCET/ROXICET) 5-325 MG per tablet 1 tablet  1 tablet Oral Q3H PRN Sherrilyn Rist A, MD   1 tablet at 06/14/20 0542  . polyethylene glycol (MIRALAX / GLYCOLAX) packet 17 g  17 g Oral Daily PRN Olalere, Adewale A, MD      . polyethylene glycol (MIRALAX / GLYCOLAX) packet 17 g  17 g Oral Daily Girguis, David, MD      . senna-docusate (Senokot-S) tablet 1 tablet  1 tablet Oral BID Dwyane Dee, MD         Allergies  Allergen Reactions  . Altace [Ramipril] Cough  :  Family History  Problem Relation Age of Onset  . Stroke Father   . Heart attack Father   . Stroke Mother   . Diabetes Brother   . Hyperlipidemia Brother   . Hypertension Brother   . Hyperlipidemia Sister   . Hypertension Sister   :  Social History   Socioeconomic History  . Marital status: Divorced    Spouse name: Not on file  . Number of children: 4  . Years of education: HS  . Highest education level: Not on file  Occupational History    Employer: RETIRED  Tobacco Use  . Smoking status: Former Smoker    Years: 5.00     Types: Cigarettes    Quit date: 11/14/2007    Years since quitting: 12.5  . Smokeless tobacco: Never Used  Substance and Sexual Activity  . Alcohol use: No  . Drug use: No  . Sexual activity: Not on file  Other Topics Concern  . Not on file  Social History Narrative   Patient lives at home with his caregiver/ex-wife.   Caffeine- 2 cups of coffee daily   Social Determinants of Health   Financial Resource Strain:   . Difficulty of Paying Living Expenses: Not on file  Food Insecurity:   . Worried About Charity fundraiser in the Last Year: Not on file  . Ran Out of Food in the Last Year: Not on file  Transportation Needs:   . Lack of Transportation (Medical): Not on file  . Lack of Transportation (Non-Medical): Not on file  Physical Activity:   . Days of Exercise per Week: Not on file  . Minutes of Exercise per Session: Not on file  Stress:   . Feeling of Stress : Not on file  Social Connections:   . Frequency of Communication with Friends and Family: Not on file  . Frequency of Social Gatherings with Friends and Family: Not on file  . Attends Religious Services: Not on file  . Active Member of Clubs or Organizations: Not on file  . Attends Archivist Meetings: Not on file  . Marital Status: Not on file  Intimate Partner Violence:   . Fear  of Current or Ex-Partner: Not on file  . Emotionally Abused: Not on file  . Physically Abused: Not on file  . Sexually Abused: Not on file  :  Review of Systems: A comprehensive 14 point review of systems was negative except as noted in the HPI.  Exam: Patient Vitals for the past 24 hrs:  BP Temp Temp src Pulse Resp SpO2 Weight  06/14/20 1100 (!) 171/43 -- -- 93 19 96 % --  06/14/20 1000 (!) 167/54 -- -- 94 15 96 % --  06/14/20 0909 -- (!) 97.5 F (36.4 C) Oral -- -- -- --  06/14/20 0900 (!) 166/55 -- -- 85 16 95 % --  06/14/20 0800 (!) 160/41 -- -- 78 14 97 % --  06/14/20 0700 (!) 154/46 -- -- 80 14 93 % --  06/14/20  0600 (!) 140/46 -- -- 84 20 91 % --  06/14/20 0500 (!) 155/49 -- -- 85 18 92 % 81.9 kg  06/14/20 0400 (!) 164/48 -- -- 79 18 93 % --  06/14/20 0300 (!) 151/53 -- -- 79 19 93 % --  06/14/20 0200 (!) 124/54 -- -- 78 19 93 % --  06/14/20 0100 134/73 -- -- 76 18 93 % --  06/14/20 0000 (!) 121/39 -- -- 74 17 93 % --  06/13/20 2300 (!) 157/51 -- -- 75 16 94 % --  06/13/20 2100 (!) 150/55 -- -- 81 (!) 22 92 % --  06/13/20 2000 (!) 148/51 (!) 97.4 F (36.3 C) Oral 79 (!) 22 92 % --  06/13/20 1900 (!) 148/55 -- -- 75 19 92 % --  06/13/20 1800 (!) 117/48 -- -- 75 17 94 % --  06/13/20 1700 (!) 138/38 -- -- 73 (!) 21 92 % --  06/13/20 1600 (!) 132/41 97.6 F (36.4 C) Oral 77 (!) 21 92 % --  06/13/20 1541 (!) 129/46 -- -- 77 20 93 % --  06/13/20 1248 (!) 119/40 -- -- 75 20 94 % --  06/13/20 1200 -- 97.8 F (36.6 C) Oral 73 20 96 % --    General: Chronically ill-appearing, no distress.   Eyes:  no scleral icterus.   ENT:  There were no oropharyngeal lesions.   Lymphatics:  Negative cervical, supraclavicular or axillary adenopathy.   Respiratory: lungs were clear bilaterally without wheezing or crackles.   Cardiovascular:  Regular rate and rhythm, S1/S2, without murmur, rub or gallop.  There was no pedal edema.   GI:  abdomen was soft, flat, nontender, nondistended, without organomegaly.   Musculoskeletal: Strength 3/5 in the right upper and right lower extremity, 1/5 in the left upper and left lower extremity. Skin: Multiple ecchymoses over his arms and legs.  Dressing over skin tear on left forearm.  Perirectal abscess not examined-see general surgery note for photo. Neuro exam was nonfocal. Patient was alert and oriented.      Lab Results  Component Value Date   WBC 14.6 (H) 06/14/2020   HGB 9.7 (L) 06/14/2020   HCT 29.2 (L) 06/14/2020   PLT 318 06/14/2020   GLUCOSE 99 06/14/2020   CHOL 137 10/08/2017   TRIG 149 10/08/2017   HDL 40 10/08/2017   LDLDIRECT 78 10/22/2016   LDLCALC 67  10/08/2017   ALT 7 06/11/2020   AST 18 06/11/2020   NA 131 (L) 06/14/2020   K 4.9 06/14/2020   CL 105 06/14/2020   CREATININE 3.31 (H) 06/14/2020   BUN 90 (H) 06/14/2020   CO2 16 (  L) 06/14/2020    CT ABDOMEN PELVIS WO CONTRAST  Addendum Date: 06/11/2020   ADDENDUM REPORT: 06/11/2020 21:43 ADDENDUM: Few borderline enlarged retroperitoneal lymph nodes, stable to decreased in size from comparison imaging. Electronically Signed   By: Lovena Le M.D.   On: 06/11/2020 21:43   Result Date: 06/11/2020 CLINICAL DATA:  Acute renal failure, metastatic prostate cancer and perianal abscess EXAM: CT ABDOMEN AND PELVIS WITHOUT CONTRAST TECHNIQUE: Multidetector CT imaging of the abdomen and pelvis was performed following the standard protocol without IV contrast. COMPARISON:  Wide for abscess cysts cough price FINDINGS: Lower chest: Small bilateral effusions. Adjacent areas of passive atelectasis. Underlying consolidation not excluded. Normal cardiac size. Pacer leads appear directed towards the coronary sinus. Coronary artery atherosclerosis is noted as well. Evidence of prior sternotomy and CABG. Hypoattenuation of the cardiac blood pool suggestive of anemia. Hepatobiliary: Borderline hepatomegaly similar to comparison. Smooth liver surface contour. No visible focal liver lesion on this unenhanced CT. Post cholecystectomy. No significant biliary ductal dilatation or visible intraductal gallstones. Pancreas: Stable atrophy of the pancreatic body and tail. No pancreatic ductal dilatation or surrounding inflammatory changes. Spleen: Normal in size. No concerning splenic lesions. Small accessory splenule. Adrenals/Urinary Tract: Lobular thickening of the adrenal glands, similar to comparison studies, without discernible focal adrenal lesion. Moderate bilateral perinephric stranding, overall similar to comparison CT from August 2021. Bilateral renal hilar calcifications are likely vascular in nature without  discernible urolithiasis or hydronephrosis. Bladder decompressed by inflated Foley catheter balloon albeit with some mild wall thickening and perivesicular hazy stranding, nonspecific in the setting of instrumentation. Stomach/Bowel: Distal esophagus, stomach and duodenal sweep are unremarkable. No small bowel wall thickening or dilatation. No evidence of obstruction. Appendix is not visualized. Normal caliber appendix is present though does course in the vicinity of adjacent free fluid in the right pericolic gutter. Question some mild mural thickening of the colon versus underdistention. Perianal gas and possible fistulous connection, as detailed below. Vascular/Lymphatic: Atherosclerotic calcifications within the abdominal aorta and branch vessels. No aneurysm or ectasia. Stable appearance of a borderline enlarged right retrocaval lymph node measuring 1.1 cm (2/35) diminished conspicuity of a right external iliac lymph node now measuring only 7 mm in size, previously 0.9 cm (2/69) stable appearance of a separate borderline enlarged right external iliac node measuring up to 1.1 cm short axis (2/80). No other concerning adenopathy. Reproductive: Mild prostatomegaly, traversed by the inflated Foley catheter balloon. Lobular thickening of the seminal vesicles is a nonspecific finding. No acute abnormality of the external genitalia. Soft tissue gas and stranding extends to the base of the left penile corpora and spongiosum. Other: Soft tissue thickening and perianal abscess with soft tissue defect of the left ischioanal fossa (2/97. A tract of gas lucencies extends from the soft tissue thickening seen just laterally to the base of the left penile corpora cavernosum and spongiosum anteriorly and to the margin of the thickened levator plate with a small amount of intersphincteric low-attenuation fluid and gas seen on coronal imaging (4/90). Small volume of low-attenuation free fluid seen layering in the pericolic gutters  and pelvis, nonspecific. Moderate body wall edema. Musculoskeletal: Multilevel sclerotic lesions present throughout the included thoracolumbar spine and bony pelvis. Overall extent and distribution similar to comparison. Multilevel degenerative changes in the spine hips and pelvis as well as the left wrist, partially included within the level of imaging. IMPRESSION: 1. Soft tissue thickening and perianal ulcer with soft tissue defect of the left ischioanal fossa. 2. A tract of gas  lucency is seen extending from the region of skin thickening and defect lateral to the ulcer base anteromedially to the left penile corpora cavernosum and spongiosum anteriorly and to the margin of the thickened left levator plate with a small amount of intersphincteric low-attenuation fluid and gas, concerning fistulous connection. A discrete abscess/collection is not discernible at this time. 3. Question some mild mural thickening of the colon versus underdistention of the cecum. Correlate with clinical symptoms and consider direct visualization. 4. Small volume of low-attenuation free fluid seen layering in the pericolic gutters and pelvis, nonspecific. 5. Small bilateral effusions with adjacent areas of passive atelectasis. Underlying consolidation not excluded. 6. Moderate body wall edema. Correlate for features of anasarca/volume third-spacing. 7. Multilevel sclerotic lesions throughout the included thoracolumbar spine and bony pelvis, consistent with metastatic disease. 8. Mild prostatomegaly and lobular thickening of the seminal vesicles. May correlate with history of patient's prostate cancer. 9. Moderate bilateral perinephric stranding, overall similar to comparison CT from August 2021. Findings could reflect senescent change, however, if urinary symptoms are present recommend correlation with urinalysis to exclude infection. 10. Aortic Atherosclerosis (ICD10-I70.0). Electronically Signed: By: Lovena Le M.D. On: 06/11/2020  20:55   NM Bone Scan Whole Body  Result Date: 06/06/2020 CLINICAL DATA:  Prostate cancer, fell on 06/06/2020 EXAM: NUCLEAR MEDICINE WHOLE BODY BONE SCAN TECHNIQUE: Whole body anterior and posterior images were obtained approximately 3 hours after intravenous injection of radiopharmaceutical. RADIOPHARMACEUTICALS:  21.6 mCi Technetium-58m MDP IV COMPARISON:  None Correlation: CT chest abdomen pelvis 06/06/2020 FINDINGS: Multiple sites of abnormal osseous tracer uptake are seen consistent with osseous metastases. These include BILATERAL ribs, thoracic spine, lumbar spine, and pelvis. Degenerative type uptake of tracer at the shoulders, elbows, and knees. Expected urinary tract and soft tissue distribution of tracer. IMPRESSION: Osseous metastatic disease as above. Electronically Signed   By: Lavonia Dana M.D.   On: 06/06/2020 18:02   DG Chest Port 1 View  Result Date: 06/11/2020 CLINICAL DATA:  Central line placement EXAM: PORTABLE CHEST 1 VIEW COMPARISON:  Radiograph 06/11/2020, CT 06/06/2020 FINDINGS: Right IJ catheter tip terminates at the level of the right atrium. Prior sternotomy and features of CABG. Stable cardiomediastinal contours. Atelectatic changes and vascular crowding are similar to comparison. Some abundant mediastinal and subpleural fat is similar to comparison CT. No convincing pneumothorax or effusion. No new focal consolidative opacity or features of edema. No acute osseous or soft tissue abnormality. Telemetry leads overlie the chest. Surgical clips at the base of the neck and evidence of left carotid stenting. IMPRESSION: 1. Right IJ catheter tip terminates at the level of the right atrium. 2. No convincing pneumothorax. 3. Stable bilateral atelectasis and vascular crowding. Electronically Signed   By: Lovena Le M.D.   On: 06/11/2020 20:05   DG Chest Port 1 View  Result Date: 06/11/2020 CLINICAL DATA:  Dyspnea EXAM: PORTABLE CHEST 1 VIEW COMPARISON:  [id number] FINDINGS: Minimal left  basilar atelectasis. Lungs are otherwise clear. No pneumothorax or pleural effusion. Coronary artery bypass grafting has been performed. Cardiac size is within normal limits. Pulmonary vascularity is normal. No acute bone abnormality. IMPRESSION: No active disease. Electronically Signed   By: Fidela Salisbury MD   On: 06/11/2020 16:37   ECHOCARDIOGRAM COMPLETE  Result Date: 06/13/2020    ECHOCARDIOGRAM REPORT   Patient Name:   SHERRELL FARISH Date of Exam: 06/13/2020 Medical Rec #:  [id number]       Height:       64.0 in Accession #:  [id number]      Weight:       175.0 lb Date of Birth:  1945-12-08       BSA:          1.849 m Patient Age:    65 years        BP:           95/44 mmHg Patient Gender: M               HR:           72 bpm. Exam Location:  Inpatient Procedure: 2D Echo, 3D Echo and Strain Analysis Indications:    Cardiomyopathy-Unspecified 425.9 / I42.9  History:        Patient has prior history of Echocardiogram examinations. CHF,                 CAD and Previous Myocardial Infarction, Prior CABG, PAD,                 Signs/Symptoms:Hypotension; Risk Factors:Hypertension and                 Dyslipidemia. Septic shock, Acute kidney injury, Anemia,                 Abscess.  Sonographer:    Darlina Sicilian RDCS Referring Phys: [id number] [place]  1. Left ventricular ejection fraction, by estimation, is 60 to 65%. The left ventricle has normal function. The left ventricle has no regional wall motion abnormalities. Left ventricular diastolic parameters are consistent with Grade II diastolic dysfunction (pseudonormalization). Elevated left atrial pressure.  2. Right ventricular systolic function is normal. The right ventricular size is normal. There is mildly elevated pulmonary artery systolic pressure. The estimated right ventricular systolic pressure is 51.8 mmHg.  3. Left atrial size was moderately dilated.  4. The mitral valve is normal in structure. Mild mitral valve regurgitation. No  evidence of mitral stenosis.  5. Tricuspid valve regurgitation is mild to moderate.  6. The aortic valve is normal in structure. Aortic valve regurgitation is not visualized. Mild aortic valve sclerosis is present, with no evidence of aortic valve stenosis.  7. The inferior vena cava is normal in size with greater than 50% respiratory variability, suggesting right atrial pressure of 3 mmHg. FINDINGS  Left Ventricle: Left ventricular ejection fraction, by estimation, is 60 to 65%. The left ventricle has normal function. The left ventricle has no regional wall motion abnormalities. The left ventricular internal cavity size was normal in size. There is  no left ventricular hypertrophy. Abnormal (paradoxical) septal motion consistent with post-operative status. Left ventricular diastolic parameters are consistent with Grade II diastolic dysfunction (pseudonormalization). Elevated left atrial pressure. Right Ventricle: The right ventricular size is normal. No increase in right ventricular wall thickness. Right ventricular systolic function is normal. There is mildly elevated pulmonary artery systolic pressure. The tricuspid regurgitant velocity is 3.11  m/s, and with an assumed right atrial pressure of 3 mmHg, the estimated right ventricular systolic pressure is 84.1 mmHg. Left Atrium: Left atrial size was moderately dilated. Right Atrium: Right atrial size was normal in size. Pericardium: There is no evidence of pericardial effusion. Mitral Valve: The mitral valve is normal in structure. Normal mobility of the mitral valve leaflets. Mild mitral valve regurgitation. No evidence of mitral valve stenosis. Tricuspid Valve: The tricuspid valve is normal in structure. Tricuspid valve regurgitation is mild to moderate. No evidence of tricuspid stenosis. Aortic Valve: The aortic valve is normal in structure. Aortic  valve regurgitation is not visualized. Mild aortic valve sclerosis is present, with no evidence of aortic valve  stenosis. Pulmonic Valve: The pulmonic valve was normal in structure. Pulmonic valve regurgitation is not visualized. No evidence of pulmonic stenosis. Aorta: The aortic root is normal in size and structure. Venous: The inferior vena cava is normal in size with greater than 50% respiratory variability, suggesting right atrial pressure of 3 mmHg. IAS/Shunts: No atrial level shunt detected by color flow Doppler.  LEFT VENTRICLE PLAX 2D LVIDd:         4.60 cm  Diastology LVIDs:         2.80 cm  LV e' lateral:   9.36 cm/s LV PW:         0.90 cm  LV E/e' lateral: 12.7 LV IVS:        1.10 cm  LV e' medial:    6.09 cm/s LVOT diam:     2.10 cm  LV E/e' medial:  19.5 LV SV:         74 LV SV Index:   40 LVOT Area:     3.46 cm                          3D Volume EF:                         3D EF:        66 %                         LV EDV:       129 ml                         LV ESV:       43 ml                         LV SV:        85 ml RIGHT VENTRICLE RV S prime:     11.90 cm/s TAPSE (M-mode): 1.5 cm LEFT ATRIUM             Index       RIGHT ATRIUM           Index LA diam:        4.80 cm 2.60 cm/m  RA Area:     10.20 cm LA Vol (A2C):   55.3 ml 29.91 ml/m RA Volume:   15.60 ml  8.44 ml/m LA Vol (A4C):   48.1 ml 26.02 ml/m LA Biplane Vol: 52.2 ml 28.24 ml/m  AORTIC VALVE LVOT Vmax:   95.10 cm/s LVOT Vmean:  64.800 cm/s LVOT VTI:    0.213 m  AORTA Ao Root diam: 3.10 cm MITRAL VALVE                TRICUSPID VALVE MV Area (PHT): 4.49 cm     TR Peak grad:   38.7 mmHg MV Decel Time: 169 msec     TR Vmax:        311.00 cm/s MV E velocity: 119.00 cm/s MV A velocity: 63.00 cm/s   SHUNTS MV E/A ratio:  1.89         Systemic VTI:  0.21 m  Systemic Diam: 2.10 cm Sanda Klein MD Electronically signed by Sanda Klein MD Signature Date/Time: 06/13/2020/12:56:22 PM    Final      CT ABDOMEN PELVIS WO CONTRAST  Addendum Date: 06/11/2020   ADDENDUM REPORT: 06/11/2020 21:43 ADDENDUM: Few borderline  enlarged retroperitoneal lymph nodes, stable to decreased in size from comparison imaging. Electronically Signed   By: Lovena Le M.D.   On: 06/11/2020 21:43   Result Date: 06/11/2020 CLINICAL DATA:  Acute renal failure, metastatic prostate cancer and perianal abscess EXAM: CT ABDOMEN AND PELVIS WITHOUT CONTRAST TECHNIQUE: Multidetector CT imaging of the abdomen and pelvis was performed following the standard protocol without IV contrast. COMPARISON:  Wide for abscess cysts cough price FINDINGS: Lower chest: Small bilateral effusions. Adjacent areas of passive atelectasis. Underlying consolidation not excluded. Normal cardiac size. Pacer leads appear directed towards the coronary sinus. Coronary artery atherosclerosis is noted as well. Evidence of prior sternotomy and CABG. Hypoattenuation of the cardiac blood pool suggestive of anemia. Hepatobiliary: Borderline hepatomegaly similar to comparison. Smooth liver surface contour. No visible focal liver lesion on this unenhanced CT. Post cholecystectomy. No significant biliary ductal dilatation or visible intraductal gallstones. Pancreas: Stable atrophy of the pancreatic body and tail. No pancreatic ductal dilatation or surrounding inflammatory changes. Spleen: Normal in size. No concerning splenic lesions. Small accessory splenule. Adrenals/Urinary Tract: Lobular thickening of the adrenal glands, similar to comparison studies, without discernible focal adrenal lesion. Moderate bilateral perinephric stranding, overall similar to comparison CT from August 2021. Bilateral renal hilar calcifications are likely vascular in nature without discernible urolithiasis or hydronephrosis. Bladder decompressed by inflated Foley catheter balloon albeit with some mild wall thickening and perivesicular hazy stranding, nonspecific in the setting of instrumentation. Stomach/Bowel: Distal esophagus, stomach and duodenal sweep are unremarkable. No small bowel wall thickening or  dilatation. No evidence of obstruction. Appendix is not visualized. Normal caliber appendix is present though does course in the vicinity of adjacent free fluid in the right pericolic gutter. Question some mild mural thickening of the colon versus underdistention. Perianal gas and possible fistulous connection, as detailed below. Vascular/Lymphatic: Atherosclerotic calcifications within the abdominal aorta and branch vessels. No aneurysm or ectasia. Stable appearance of a borderline enlarged right retrocaval lymph node measuring 1.1 cm (2/35) diminished conspicuity of a right external iliac lymph node now measuring only 7 mm in size, previously 0.9 cm (2/69) stable appearance of a separate borderline enlarged right external iliac node measuring up to 1.1 cm short axis (2/80). No other concerning adenopathy. Reproductive: Mild prostatomegaly, traversed by the inflated Foley catheter balloon. Lobular thickening of the seminal vesicles is a nonspecific finding. No acute abnormality of the external genitalia. Soft tissue gas and stranding extends to the base of the left penile corpora and spongiosum. Other: Soft tissue thickening and perianal abscess with soft tissue defect of the left ischioanal fossa (2/97. A tract of gas lucencies extends from the soft tissue thickening seen just laterally to the base of the left penile corpora cavernosum and spongiosum anteriorly and to the margin of the thickened levator plate with a small amount of intersphincteric low-attenuation fluid and gas seen on coronal imaging (4/90). Small volume of low-attenuation free fluid seen layering in the pericolic gutters and pelvis, nonspecific. Moderate body wall edema. Musculoskeletal: Multilevel sclerotic lesions present throughout the included thoracolumbar spine and bony pelvis. Overall extent and distribution similar to comparison. Multilevel degenerative changes in the spine hips and pelvis as well as the left wrist, partially included  within the level of imaging.  IMPRESSION: 1. Soft tissue thickening and perianal ulcer with soft tissue defect of the left ischioanal fossa. 2. A tract of gas lucency is seen extending from the region of skin thickening and defect lateral to the ulcer base anteromedially to the left penile corpora cavernosum and spongiosum anteriorly and to the margin of the thickened left levator plate with a small amount of intersphincteric low-attenuation fluid and gas, concerning fistulous connection. A discrete abscess/collection is not discernible at this time. 3. Question some mild mural thickening of the colon versus underdistention of the cecum. Correlate with clinical symptoms and consider direct visualization. 4. Small volume of low-attenuation free fluid seen layering in the pericolic gutters and pelvis, nonspecific. 5. Small bilateral effusions with adjacent areas of passive atelectasis. Underlying consolidation not excluded. 6. Moderate body wall edema. Correlate for features of anasarca/volume third-spacing. 7. Multilevel sclerotic lesions throughout the included thoracolumbar spine and bony pelvis, consistent with metastatic disease. 8. Mild prostatomegaly and lobular thickening of the seminal vesicles. May correlate with history of patient's prostate cancer. 9. Moderate bilateral perinephric stranding, overall similar to comparison CT from August 2021. Findings could reflect senescent change, however, if urinary symptoms are present recommend correlation with urinalysis to exclude infection. 10. Aortic Atherosclerosis (ICD10-I70.0). Electronically Signed: By: Lovena Le M.D. On: 06/11/2020 20:55   NM Bone Scan Whole Body  Result Date: 06/06/2020 CLINICAL DATA:  Prostate cancer, fell on 06/06/2020 EXAM: NUCLEAR MEDICINE WHOLE BODY BONE SCAN TECHNIQUE: Whole body anterior and posterior images were obtained approximately 3 hours after intravenous injection of radiopharmaceutical. RADIOPHARMACEUTICALS:  21.6 mCi  Technetium-49m MDP IV COMPARISON:  None Correlation: CT chest abdomen pelvis 06/06/2020 FINDINGS: Multiple sites of abnormal osseous tracer uptake are seen consistent with osseous metastases. These include BILATERAL ribs, thoracic spine, lumbar spine, and pelvis. Degenerative type uptake of tracer at the shoulders, elbows, and knees. Expected urinary tract and soft tissue distribution of tracer. IMPRESSION: Osseous metastatic disease as above. Electronically Signed   By: Lavonia Dana M.D.   On: 06/06/2020 18:02   DG Chest Port 1 View  Result Date: 06/11/2020 CLINICAL DATA:  Central line placement EXAM: PORTABLE CHEST 1 VIEW COMPARISON:  Radiograph 06/11/2020, CT 06/06/2020 FINDINGS: Right IJ catheter tip terminates at the level of the right atrium. Prior sternotomy and features of CABG. Stable cardiomediastinal contours. Atelectatic changes and vascular crowding are similar to comparison. Some abundant mediastinal and subpleural fat is similar to comparison CT. No convincing pneumothorax or effusion. No new focal consolidative opacity or features of edema. No acute osseous or soft tissue abnormality. Telemetry leads overlie the chest. Surgical clips at the base of the neck and evidence of left carotid stenting. IMPRESSION: 1. Right IJ catheter tip terminates at the level of the right atrium. 2. No convincing pneumothorax. 3. Stable bilateral atelectasis and vascular crowding. Electronically Signed   By: Lovena Le M.D.   On: 06/11/2020 20:05   DG Chest Port 1 View  Result Date: 06/11/2020 CLINICAL DATA:  Dyspnea EXAM: PORTABLE CHEST 1 VIEW COMPARISON:  [id number] FINDINGS: Minimal left basilar atelectasis. Lungs are otherwise clear. No pneumothorax or pleural effusion. Coronary artery bypass grafting has been performed. Cardiac size is within normal limits. Pulmonary vascularity is normal. No acute bone abnormality. IMPRESSION: No active disease. Electronically Signed   By: Fidela Salisbury MD   On: 06/11/2020  16:37   ECHOCARDIOGRAM COMPLETE  Result Date: 06/13/2020    ECHOCARDIOGRAM REPORT   Patient Name:   TIMITHY ARONS Date of Exam: 06/13/2020 Medical  Rec #:  [id number]       Height:       64.0 in Accession #:    [id number]      Weight:       175.0 lb Date of Birth:  Sep 14, 1946       BSA:          1.849 m Patient Age:    89 years        BP:           95/44 mmHg Patient Gender: M               HR:           72 bpm. Exam Location:  Inpatient Procedure: 2D Echo, 3D Echo and Strain Analysis Indications:    Cardiomyopathy-Unspecified 425.9 / I42.9  History:        Patient has prior history of Echocardiogram examinations. CHF,                 CAD and Previous Myocardial Infarction, Prior CABG, PAD,                 Signs/Symptoms:Hypotension; Risk Factors:Hypertension and                 Dyslipidemia. Septic shock, Acute kidney injury, Anemia,                 Abscess.  Sonographer:    Darlina Sicilian RDCS Referring Phys: [id number] [place]  1. Left ventricular ejection fraction, by estimation, is 60 to 65%. The left ventricle has normal function. The left ventricle has no regional wall motion abnormalities. Left ventricular diastolic parameters are consistent with Grade II diastolic dysfunction (pseudonormalization). Elevated left atrial pressure.  2. Right ventricular systolic function is normal. The right ventricular size is normal. There is mildly elevated pulmonary artery systolic pressure. The estimated right ventricular systolic pressure is 22.9 mmHg.  3. Left atrial size was moderately dilated.  4. The mitral valve is normal in structure. Mild mitral valve regurgitation. No evidence of mitral stenosis.  5. Tricuspid valve regurgitation is mild to moderate.  6. The aortic valve is normal in structure. Aortic valve regurgitation is not visualized. Mild aortic valve sclerosis is present, with no evidence of aortic valve stenosis.  7. The inferior vena cava is normal in size with greater than 50%  respiratory variability, suggesting right atrial pressure of 3 mmHg. FINDINGS  Left Ventricle: Left ventricular ejection fraction, by estimation, is 60 to 65%. The left ventricle has normal function. The left ventricle has no regional wall motion abnormalities. The left ventricular internal cavity size was normal in size. There is  no left ventricular hypertrophy. Abnormal (paradoxical) septal motion consistent with post-operative status. Left ventricular diastolic parameters are consistent with Grade II diastolic dysfunction (pseudonormalization). Elevated left atrial pressure. Right Ventricle: The right ventricular size is normal. No increase in right ventricular wall thickness. Right ventricular systolic function is normal. There is mildly elevated pulmonary artery systolic pressure. The tricuspid regurgitant velocity is 3.11  m/s, and with an assumed right atrial pressure of 3 mmHg, the estimated right ventricular systolic pressure is 79.8 mmHg. Left Atrium: Left atrial size was moderately dilated. Right Atrium: Right atrial size was normal in size. Pericardium: There is no evidence of pericardial effusion. Mitral Valve: The mitral valve is normal in structure. Normal mobility of the mitral valve leaflets. Mild mitral valve regurgitation. No evidence of mitral valve stenosis. Tricuspid Valve: The tricuspid valve is normal  in structure. Tricuspid valve regurgitation is mild to moderate. No evidence of tricuspid stenosis. Aortic Valve: The aortic valve is normal in structure. Aortic valve regurgitation is not visualized. Mild aortic valve sclerosis is present, with no evidence of aortic valve stenosis. Pulmonic Valve: The pulmonic valve was normal in structure. Pulmonic valve regurgitation is not visualized. No evidence of pulmonic stenosis. Aorta: The aortic root is normal in size and structure. Venous: The inferior vena cava is normal in size with greater than 50% respiratory variability, suggesting right atrial  pressure of 3 mmHg. IAS/Shunts: No atrial level shunt detected by color flow Doppler.  LEFT VENTRICLE PLAX 2D LVIDd:         4.60 cm  Diastology LVIDs:         2.80 cm  LV e' lateral:   9.36 cm/s LV PW:         0.90 cm  LV E/e' lateral: 12.7 LV IVS:        1.10 cm  LV e' medial:    6.09 cm/s LVOT diam:     2.10 cm  LV E/e' medial:  19.5 LV SV:         74 LV SV Index:   40 LVOT Area:     3.46 cm                          3D Volume EF:                         3D EF:        66 %                         LV EDV:       129 ml                         LV ESV:       43 ml                         LV SV:        85 ml RIGHT VENTRICLE RV S prime:     11.90 cm/s TAPSE (M-mode): 1.5 cm LEFT ATRIUM             Index       RIGHT ATRIUM           Index LA diam:        4.80 cm 2.60 cm/m  RA Area:     10.20 cm LA Vol (A2C):   55.3 ml 29.91 ml/m RA Volume:   15.60 ml  8.44 ml/m LA Vol (A4C):   48.1 ml 26.02 ml/m LA Biplane Vol: 52.2 ml 28.24 ml/m  AORTIC VALVE LVOT Vmax:   95.10 cm/s LVOT Vmean:  64.800 cm/s LVOT VTI:    0.213 m  AORTA Ao Root diam: 3.10 cm MITRAL VALVE                TRICUSPID VALVE MV Area (PHT): 4.49 cm     TR Peak grad:   38.7 mmHg MV Decel Time: 169 msec     TR Vmax:        311.00 cm/s MV E velocity: 119.00 cm/s MV A velocity: 63.00 cm/s   SHUNTS MV E/A ratio:  1.89         Systemic VTI:  0.21 m                             Systemic Diam: 2.10 cm Sanda Klein MD Electronically signed by Sanda Klein MD Signature Date/Time: 06/13/2020/12:56:22 PM    Final     Pathology:  Prostate biopsy performed on 05/04/2010 (report scanned into epic) showed prostatic adenocarcinoma Gleason score 4+5 = 9  Assessment and Plan:  This is a 74 year old male with:  1.  Metastatic prostate adenocarcinoma.  Outside records from [facility] urology and current imaging findings have been reviewed and discussed with the patient and his caregiver.  Discussed the natural course of metastatic prostate cancer and the incurable  nature of the disease.  Performance status is estimated to be approximately 3 at baseline.  Further discussion later today per Dr. Alen Blew regarding treatment recommendations.  2.  Leukocytosis.  Due to acute infection.  WBC is trending downward.  Remains on IV antibiotics and remains afebrile.  Antibiotics per hospitalist.  Monitor CBC.  3.  Anemia.  Due to underlying malignancy and acute infection.  Status post 2 units PRBCs on 06/12/2020 with improvement of his hemoglobin.  Monitor closely.  Transfuse for hemoglobin less than 7.  4.  AKI.  Renal function slowly improving.  Nephrology is following closely.  They have indicated that he would not be a candidate for hemodialysis if ever required in the future.  5.  Left gluteal wound.  Undergoing hydrotherapy and dressing changes.  Management per general surgery and wound center as an outpatient.  6.  History of seizure disorder.  No seizure noted since 2009.  Remains on Keppra and is followed by neurology.  7.  Parkinson's disease.  On Sinemet.  Followed by neurology.  8.  Diabetes mellitus.  On insulin.  Management per hospitalist.   Thank you for this referral.   Mikey Bussing, DNP, AGPCNP-BC, AOCNP

## 2020-06-14 NOTE — Progress Notes (Signed)
Admit: 06/11/2020 LOS: 3  42M metastatic prostate DCa, admit with anorexia/N/V, perirectal abscess, aKI, hyponatremia  Subjective:  [name] More awaek, stable overnight . Excellent UOP . SNA 131, SCr down to 3.3 . Palliative notes reviewed  09/01 0701 - 09/02 0700 In: 2140.6 [P.O.:360; I.V.:1580.6; IV Piggyback:200] Out: 4400 [Urine:4400]  Filed Weights   06/12/20 0500 06/13/20 0257 06/14/20 0500  Weight: 77.8 kg 79.4 kg 81.9 kg    Scheduled Meds: . carbidopa-levodopa  2 tablet Oral TID  . chlorhexidine  15 mL Mouth Rinse BID  . Chlorhexidine Gluconate Cloth  6 each Topical Daily  . collagenase   Topical BID  . dantrolene  50 mg Oral BID  . dipyridamole-aspirin  1 capsule Oral BID  . heparin  5,000 Units Subcutaneous Q8H  . insulin aspart  0-5 Units Subcutaneous QHS  . insulin aspart  0-9 Units Subcutaneous TID WC  . insulin aspart  3 Units Subcutaneous TID WC  . insulin detemir  0.3 Units/kg Subcutaneous Q24H  . levETIRAcetam  500 mg Oral Daily  . mouth rinse  15 mL Mouth Rinse q12n4p  . polyethylene glycol  17 g Oral Daily  . senna-docusate  1 tablet Oral BID   Continuous Infusions: . sodium chloride Stopped (06/12/20 0635)  . sodium chloride 75 mL/hr at 06/14/20 1000  . ampicillin-sulbactam (UNASYN) IV     PRN Meds:.dextrose, oxyCODONE-acetaminophen, polyethylene glycol  Current Labs: reviewed    Physical Exam:  Blood pressure (!) 171/43, pulse 93, temperature (!) 97.5 F (36.4 C), temperature source Oral, resp. rate 19, height 5\' 4"  (1.626 m), weight 81.9 kg, SpO2 96 %. Chronically ill appearing, L sided paresis in face, UEs Sacral ulcer present RRR CTAB Soft Trace LEE  A 1. AKI; likely pre-renal hypovolemia with N/V/poor PO + ARB; 8/30 CT w/o obstruction 2. Hyponatremia, suspect hypovolemic UNa < 10; improved 3. Recent finding metstatic prostate Ca 4. Perirectal abscess, per surgery 5. DKA / Hyperglycemia 6. Septic Shock, improved off pressors 7. Hx/o  CVA 8. ASCVD hx/o CABG 9. Seizure disorder  10. Parkinsonism  P . Cont hyration until tolerating PO . Not a candidate for HD . Medication Issues; o Preferred narcotic agents for pain control are hydromorphone, fentanyl, and methadone. Morphine should not be used.  o Baclofen should be avoided o Avoid oral sodium phosphate and magnesium citrate based laxatives / bowel preps    Pearson Grippe MD 06/14/2020, 11:34 AM  Recent Labs  Lab 06/11/20 1816 06/12/20 0042 06/12/20 0530 06/12/20 0845 06/12/20 1600 06/13/20 0302 06/14/20 0236 06/14/20 0930  NA 113*   < > 118*   < > 122* 124* 131*  --   K 5.2*   < > 4.3   < > 4.8 4.7 4.9  --   CL 80*   < > 88*   < > 95* 96* 105  --   CO2 11*   < > 15*   < > 14* 16* 16*  --   GLUCOSE 440*   < > 233*   < > 185* 143* 99  --   BUN 120*   < > 112*   < > 109* 104* 90*  --   CREATININE 5.26*   < > 5.07*   < > 4.76* 4.47* 3.31*  --   CALCIUM 8.2*   < > 8.0*   < > 7.8* 8.1* 8.2*  --   PHOS 6.4*  --  5.4*  --   --   --   --  5.4*   < > = values in this interval not displayed.   Recent Labs  Lab 06/11/20 1434 06/11/20 1434 06/12/20 0530 06/12/20 0530 06/12/20 1851 06/13/20 0906 06/14/20 0930  WBC 25.6*   < > 20.9*  --   --  16.9* 14.6*  NEUTROABS 23.1*  --   --   --   --   --  11.8*  HGB 7.5*   < > 6.0*   < > 8.1* 8.9* 9.7*  HCT 22.2*   < > 17.8*   < > 24.2* 25.8* 29.2*  MCV 94.9   < > 94.2  --   --  86.6 88.0  PLT 251   < > 196  --   --  224 318   < > = values in this interval not displayed.

## 2020-06-14 NOTE — Assessment & Plan Note (Signed)
-  Recent diagnosis in July 2021 -Appears to be following outpatient with urology with plans to start on androgen deprivation therapy -Also evaluated by oncology inpatient, appreciate assistance.  Plan will also be to continue on same course of treatment at discharge with no further oncology needs at this time

## 2020-06-14 NOTE — Assessment & Plan Note (Signed)
-  Present on admission and has since resolved -Continue SSI and CBG monitoring

## 2020-06-14 NOTE — Assessment & Plan Note (Addendum)
-  Initially admitted to the ICU and started on vasopressor support after fluid resuscitation.  Source considered gluteal wound given no other obvious source found -Has been treated with vancomycin, cefepime, Flagyl -Will further de-escalate down to Unasyn at this time, on 06/14/2020; Augmentin course to be completed for total of 14-day treatment at discharge

## 2020-06-14 NOTE — Assessment & Plan Note (Signed)
-   follow BMP daily

## 2020-06-14 NOTE — Progress Notes (Signed)
Pharmacy Antibiotic Note  [name] is a 74 y.o. male admitted on 06/11/2020 with septic shock. Pharmacy has been consulted for vancomycin + cefepime dosing. Pt has PMH significant for recently diagnosed prostate cancer w/mets to the bone. Pt has chronic indwelling Foley, AKI on admission - not a candidate for HD at this time. Pt has perirectal abscess, recently prescribed cephalexin PTA.  Today, 06/14/20  Scr down to 3.31 from 5.26 on admit WBC down to 16.9 yesterday AF Per [facility] note 8/31 "wound appears almost more like a pressure injury rather than an abscess, I do not think this is source of septic shock"   Plan: Dc vancomycin/cefepime per Dr Sabino Gasser Start Unasyn 3 gm IV q12h for CrCl 18 ml/min F/u renal fxn and ability to change to augmentin   Height: 5\' 4"  (162.6 cm) Weight: 81.9 kg (180 lb 8.9 oz) IBW/kg (Calculated) : 59.2  Temp (24hrs), Avg:97.6 F (36.4 C), Min:97.4 F (36.3 C), Max:97.8 F (36.6 C)  Recent Labs  Lab 06/11/20 1346 06/11/20 1434 06/11/20 1816 06/12/20 0042 06/12/20 0042 06/12/20 0530 06/12/20 0845 06/12/20 1600 06/13/20 0302 06/13/20 0906 06/14/20 0236  WBC  --  25.6*  --   --   --  20.9*  --   --   --  16.9*  --   CREATININE  --  5.02*   < > 5.02*   < > 5.07* 5.04* 4.76* 4.47*  --  3.31*  LATICACIDVEN 1.3  --   --  0.9  --   --   --   --   --   --   --    < > = values in this interval not displayed.    Estimated Creatinine Clearance: 18.3 mL/min (A) (by C-G formula based on SCr of 3.31 mg/dL (H)).    Allergies  Allergen Reactions  . Altace [Ramipril] Cough   Antimicrobials this admission:  8/30 Zosyn x 1 8/30 Vanc >>9/2 8/31 Cefepime >>9/2 8/31 Flagyl >>9/1 9/2 unasyn>> Dose adjustments this admission:    Microbiology results:  8/30 BCx2: ngtd 8/30 UCx cath: 40K yeast (chronic indwelling foley)F 8/30 COVID: neg 8/30 Perianus wound: few GPC in clusters, few GNR 8/31 MRSA PCR neg  Thank you for allowing pharmacy to be a part  of this patient's care.  Eudelia Bunch, Pharm.D 06/14/2020 9:07 AM

## 2020-06-14 NOTE — Assessment & Plan Note (Addendum)
-   baseline creat approx 1.9 -Considered prerenal with some possible ATN on admission -Creatinine slowly improving, continue to trend; appears back to baseline at this time

## 2020-06-14 NOTE — Assessment & Plan Note (Addendum)
-  Echo performed on 06/13/2020.  EF 60 to 65% with grade 2 diastolic dysfunction -BNP elevated on 06/11/2020 (1002) -Has not received diuresis in setting of recent septic shock however is now starting to pick up urine output, likely in diuretic phase of ATN -Continue monitoring for any significant signs of volume overload

## 2020-06-14 NOTE — TOC Progression Note (Addendum)
Transition of Care [facility]) - Progression Note    Patient Details  Name: [name] MRN: [id number] Date of Birth: December 15, 1945  Transition of Care [facility]) CM/SW Contact  [name], RN Phone Number: 06/14/2020, 8:40 AM  Clinical Narrative:    TCT [name] with [employer] information for her to contact [name] left on voice mail. Return call from [name] with [employer] will contact family this am.  Expected Discharge Plan: Home/Self Care Barriers to Discharge: Continued Medical Work up  Expected Discharge Plan and Services Expected Discharge Plan: Home/Self Care   Discharge Planning Services: CM Consult   Living arrangements for the past 2 months: Single Family Home                                       Social Determinants of Health (SDOH) Interventions    Readmission Risk Interventions No flowsheet data found.

## 2020-06-14 NOTE — Discharge Instructions (Signed)
Collagenase ointment What is this medicine? COLLAGENASE (kohl LAH jen ace) is an enzyme that breaks down collagen in damaged tissue and helps healthy tissue to grow. It may help wounds heal faster. This medicine may be used for other purposes; ask your health care provider or pharmacist if you have questions. COMMON BRAND NAME(S): Santyl What should I tell my health care provider before I take this medicine? They need to know if you have any of these conditions:  an unusual or allergic reaction to collagenase, other medicines, foods, dyes, or preservatives  pregnant or trying to get pregnant  breast-feeding How should I use this medicine? This medicine is for external use only. Do not take by mouth. Wash the wound as directed by your health care professional. Do not scrub. If you are applying a topical antibiotic to the wound, apply the antibiotic before this medicine. Do not touch the tip of the ointment tube with any surface, especially your fingers or the wound. Try to only get the ointment on the wound itself. If some gets on normal skin, wipe away with a sterile gauze pad. Do not use your medicine more often than directed. Talk to your pediatrician regarding the use of this medicine in children. Special care may be needed. Overdosage: If you think you have taken too much of this medicine contact a poison control center or emergency room at once. NOTE: This medicine is only for you. Do not share this medicine with others. What if I miss a dose? If you miss a dose, use it as soon as you can. If it is almost time for your next dose, use only that dose. Do not use double or extra doses. What may interact with this medicine? Do not take this medicine with any of the following medications:  aluminum acetate, Burow's solution  povidone iodine  silver nitrate  silver sulfadiazine This list may not describe all possible interactions. Give your health care provider a list of all the  medicines, herbs, non-prescription drugs, or dietary supplements you use. Also tell them if you smoke, drink alcohol, or use illegal drugs. Some items may interact with your medicine. What should I watch for while using this medicine? Visit your doctor or health care professional for regular checks on your progress. If your wound begins to look worse, smell bad, has colored discharge or more discharge, or increases in size, contact your health care professional. You may have an infection or need a change in your treatment. If you develop a fever, chills, low blood pressure, dizziness, rapid heartbeat, or confusion, contact your health care professional immediately. You may have an infection of your blood. What side effects may I notice from receiving this medicine? Side effects that you should report to your doctor or health care professional as soon as possible:  breathing problems  skin rash or hives Side effects that usually do not require medical attention (report to your doctor or health care professional if they continue or are bothersome):  redness at the application site This list may not describe all possible side effects. Call your doctor for medical advice about side effects. You may report side effects to FDA at 1-800-FDA-1088. Where should I keep my medicine? Keep out of the reach of children. Store below 25 degrees C (77 degrees F). Throw away any unused medication after the expiration date. NOTE: This sheet is a summary. It may not cover all possible information. If you have questions about this medicine, talk to your doctor,  pharmacist, or health care provider.  2020 Elsevier/Gold Standard (2008-01-11 16:39:42)

## 2020-06-14 NOTE — Assessment & Plan Note (Signed)
Continue Keppra.

## 2020-06-14 NOTE — Progress Notes (Signed)
Hydrologist [facility])  Hospital Liaison RN note         Notified by [facility] manager of patient/family request for [facility] Palliative services at home after discharge.         Writer spoke with patient and partner [name] to confirm interest and explain services.  Education provided regarding outpatient palliative vs hospices services . [name] and pt both clear that they wish to pursue treatment as discussed with Dr. Alen Blew and do not wish for hospice support at this time.        [place] Palliative team will follow up with patient after discharge.         Please call with any hospice or palliative related questions.         Thank you for the opportunity to participate in this patient's care.     Chrislyn Edison Pace, BSN, RN [place] (listed on [place] under Hospice[facility])    [phone]

## 2020-06-15 LAB — GLUCOSE, CAPILLARY
Glucose-Capillary: 135 mg/dL — ABNORMAL HIGH (ref 70–99)
Glucose-Capillary: 105 mg/dL — ABNORMAL HIGH (ref 70–99)
Glucose-Capillary: 118 mg/dL — ABNORMAL HIGH (ref 70–99)
Glucose-Capillary: 153 mg/dL — ABNORMAL HIGH (ref 70–99)
Glucose-Capillary: 165 mg/dL — ABNORMAL HIGH (ref 70–99)

## 2020-06-15 LAB — BASIC METABOLIC PANEL
Anion gap: 11 (ref 5–15)
BUN: 63 mg/dL — ABNORMAL HIGH (ref 8–23)
CO2: 16 mmol/L — ABNORMAL LOW (ref 22–32)
Calcium: 8.5 mg/dL — ABNORMAL LOW (ref 8.9–10.3)
Chloride: 111 mmol/L (ref 98–111)
Creatinine, Ser: 1.95 mg/dL — ABNORMAL HIGH (ref 0.61–1.24)
GFR calc Af Amer: 38 mL/min — ABNORMAL LOW (ref >60)
GFR calc non Af Amer: 32 mL/min — ABNORMAL LOW (ref >60)
Glucose, Bld: 116 mg/dL — ABNORMAL HIGH (ref 70–99)
Potassium: 5 mmol/L (ref 3.5–5.1)
Sodium: 138 mmol/L (ref 135–145)

## 2020-06-15 LAB — CBC WITH DIFFERENTIAL/PLATELET
Abs Immature Granulocytes: 0.68 10*3/uL — ABNORMAL HIGH (ref 0.00–0.07)
Basophils Absolute: 0.1 10*3/uL (ref 0.0–0.1)
Basophils Relative: 0 %
Eosinophils Absolute: 0.2 10*3/uL (ref 0.0–0.5)
Eosinophils Relative: 1 %
HCT: 29.5 % — ABNORMAL LOW (ref 39.0–52.0)
Hemoglobin: 10.3 g/dL — ABNORMAL LOW (ref 13.0–17.0)
Immature Granulocytes: 4 %
Lymphocytes Relative: 6 %
Lymphs Abs: 0.9 10*3/uL (ref 0.7–4.0)
MCH: 29.7 pg (ref 26.0–34.0)
MCHC: 34.9 g/dL (ref 30.0–36.0)
MCV: 85 fL (ref 80.0–100.0)
Monocytes Absolute: 1.6 10*3/uL — ABNORMAL HIGH (ref 0.1–1.0)
Monocytes Relative: 10 %
Neutro Abs: 13.5 10*3/uL — ABNORMAL HIGH (ref 1.7–7.7)
Neutrophils Relative %: 79 %
Platelets: 379 10*3/uL (ref 150–400)
RBC: 3.47 MIL/uL — ABNORMAL LOW (ref 4.22–5.81)
RDW: 20.5 % — ABNORMAL HIGH (ref 11.5–15.5)
WBC: 17 10*3/uL — ABNORMAL HIGH (ref 4.0–10.5)
nRBC: 0 % (ref 0.0–0.2)

## 2020-06-15 LAB — MAGNESIUM: Magnesium: 1.6 mg/dL — ABNORMAL LOW (ref 1.7–2.4)

## 2020-06-15 LAB — PHOSPHORUS: Phosphorus: 3.7 mg/dL (ref 2.5–4.6)

## 2020-06-15 MED ORDER — SODIUM CHLORIDE 0.9 % IV SOLN: 3.0000 g | Freq: Four times a day (QID) | INTRAVENOUS | Status: DC

## 2020-06-15 MED ORDER — SODIUM CHLORIDE 0.9 % IV SOLN
3.0000 g | Freq: Four times a day (QID) | INTRAVENOUS | Status: DC
  Administered 2020-06-15 – 2020-06-18 (×13): 3 g via INTRAVENOUS
  Filled 2020-06-15 (×2): qty 0.25
  Filled 2020-06-15: qty 3
  Filled 2020-06-15: qty 0.25
  Filled 2020-06-15: qty 8
  Filled 2020-06-15: qty 3
  Filled 2020-06-15: qty 8
  Filled 2020-06-15: qty 3
  Filled 2020-06-15: qty 8
  Filled 2020-06-15: qty 0.25
  Filled 2020-06-15: qty 3
  Filled 2020-06-15: qty 8
  Filled 2020-06-15 (×2): qty 3

## 2020-06-15 MED ORDER — MELATONIN 3 MG PO TABS
6.0000 mg | ORAL_TABLET | Freq: Every evening | ORAL | Status: DC | PRN
  Administered 2020-06-15 – 2020-06-17 (×3): 6 mg via ORAL
  Filled 2020-06-15 (×3): qty 2

## 2020-06-15 MED ORDER — LACTATED RINGERS IV SOLN: INTRAVENOUS | Status: DC

## 2020-06-15 NOTE — Care Management Important Message (Signed)
Important Message  Patient Details  IM Letter given to the Patient Name: [name] MRN: [id number] Date of Birth: 1946-06-22   Medicare Important Message Given:  Yes     Kerin Salen 06/15/2020, 12:23 PM

## 2020-06-15 NOTE — Progress Notes (Signed)
Admit: 06/11/2020 LOS: 4  62M metastatic prostate DCa, admit with anorexia/N/V, perirectal abscess, aKI, hyponatremia  Subjective:  . Onc notes reviewed, likely to rec ADT . SCr much improved, uOP up. SNa normalized . Ex wife at bedside  09/02 0701 - 09/03 0700 In: 1381 [I.V.:1183; IV Piggyback:198] Out: 2075 [Urine:2075]  Filed Weights   06/12/20 0500 06/13/20 0257 06/14/20 0500  Weight: 77.8 kg 79.4 kg 81.9 kg    Scheduled Meds: . carbidopa-levodopa  2 tablet Oral TID  . chlorhexidine  15 mL Mouth Rinse BID  . Chlorhexidine Gluconate Cloth  6 each Topical Daily  . collagenase   Topical BID  . dantrolene  50 mg Oral BID  . dipyridamole-aspirin  1 capsule Oral BID  . heparin  5,000 Units Subcutaneous Q8H  . insulin aspart  0-5 Units Subcutaneous QHS  . insulin aspart  0-9 Units Subcutaneous TID WC  . insulin aspart  3 Units Subcutaneous TID WC  . insulin detemir  0.3 Units/kg Subcutaneous Q24H  . levETIRAcetam  500 mg Oral Daily  . mouth rinse  15 mL Mouth Rinse q12n4p  . polyethylene glycol  17 g Oral Daily  . senna-docusate  1 tablet Oral BID   Continuous Infusions: . sodium chloride Stopped (06/12/20 0635)  . sodium chloride 75 mL/hr at 06/15/20 0607  . ampicillin-sulbactam (UNASYN) IV     PRN Meds:.dextrose, oxyCODONE-acetaminophen, polyethylene glycol  Current Labs: reviewed    Physical Exam:  Blood pressure (!) 158/65, pulse 91, temperature 97.8 F (36.6 C), temperature source Oral, resp. rate 17, height 5\' 4"  (1.626 m), weight 81.9 kg, SpO2 97 %. Chronically ill appearing, awake, alert, conversant L sided paresis in face, UEs Sacral ulcer present RRR CTAB Soft Trace LEE  A 1. AKI; likely pre-renal hypovolemia with N/V/poor PO + ARB; 8/30 CT w/o obstruction; resolving.  Has some BL CKD, SCr high 1s 2. Hyponatremia, suspect hypovolemic UNa < 10; resolved 3. Recent finding metstatic prostate Ca, for ADT with urology 4. Perirectal abscess, per  surgery 5. DKA / Hyperglycemia 6. Septic Shock, improved off pressors 7. Hx/o CVA 8. ASCVD hx/o CABG 9. Seizure disorder  10. Parkinsonism  P . Cont hyration until tolerating PO -- still not taking in very much, change to LR today . No further suggestions. Improving GFR, hyponatremia resolved. Doesn't need immediate outpt neph f/u, but uro or PCP can refer if needed.  Will s/o at this time. Call with questions or concerns.    Pearson Grippe MD 06/15/2020, 11:57 AM  Recent Labs  Lab 06/12/20 0530 06/12/20 0845 06/13/20 0302 06/14/20 0236 06/14/20 0930 06/15/20 0529  NA 118*   < > 124* 131*  --  138  K 4.3   < > 4.7 4.9  --  5.0  CL 88*   < > 96* 105  --  111  CO2 15*   < > 16* 16*  --  16*  GLUCOSE 233*   < > 143* 99  --  116*  BUN 112*   < > 104* 90*  --  63*  CREATININE 5.07*   < > 4.47* 3.31*  --  1.95*  CALCIUM 8.0*   < > 8.1* 8.2*  --  8.5*  PHOS 5.4*  --   --   --  5.4* 3.7   < > = values in this interval not displayed.   Recent Labs  Lab 06/11/20 1434 06/12/20 0530 06/13/20 0906 06/14/20 0930 06/15/20 0529  WBC 25.6*   < >  16.9* 14.6* 17.0*  NEUTROABS 23.1*  --   --  11.8* 13.5*  HGB 7.5*   < > 8.9* 9.7* 10.3*  HCT 22.2*   < > 25.8* 29.2* 29.5*  MCV 94.9   < > 86.6 88.0 85.0  PLT 251   < > 224 318 379   < > = values in this interval not displayed.

## 2020-06-15 NOTE — Progress Notes (Signed)
06/15/20 9:40 - 10:15   Subjective Assessment  Subjective doing  OK  --   Patient and Family Stated Goals to go home  --   Date of Onset  (present)  --   Prior Treatments dress change at home by caregiver  --   Evaluation and Treatment  Evaluation and Treatment Procedures Explained to Patient/Family Yes  --   Evaluation and Treatment Procedures agreed to  --   Pressure Injury 06/12/20 Scrotum Posterior Unstageable - Full thickness tissue loss in which the base of the injury is covered by slough (yellow, tan, gray, green or brown) and/or eschar (tan, brown or black) in the wound bed.  Date First Assessed/Time First Assessed: 06/12/20 0000   Location: Scrotum  Location Orientation: Posterior  Staging: Unstageable - Full thickness tissue loss in which the base of the injury is covered by slough (yellow, tan, gray, green or brown) and...  Dressing Type  --  Foam - Lift dressing to assess site every shift  Dressing  --  Intact  Dressing Change Frequency  --  Daily  Site / Wound Assessment  --  Dressing in place / Unable to assess  Pressure Injury 06/12/20 Buttocks Left Unstageable - Full thickness tissue loss in which the base of the injury is covered by slough (yellow, tan, gray, green or brown) and/or eschar (tan, brown or black) in the wound bed. with tunneling  Date First Assessed/Time First Assessed: 06/12/20 0000   Location: Buttocks  Location Orientation: Left  Staging: Unstageable - Full thickness tissue loss in which the base of the injury is covered by slough (yellow, tan, gray, green or brown) and/or ...  Dressing Type  --  ABD;Gauze (Comment)  Dressing  --  Intact  Site / Wound Assessment  --  Dressing in place / Unable to assess  Pressure Injury 06/12/20 Rectum Left Unstageable - Full thickness tissue loss in which the base of the injury is covered by slough (yellow, tan, gray, green or brown) and/or eschar (tan, brown or black) in the wound bed. PT only- perirectal open wound wi   Date First Assessed/Time First Assessed: 06/12/20 1406   Location: Rectum  Location Orientation: Left  Staging: Unstageable - Full thickness tissue loss in which the base of the injury is covered by slough (yellow, tan, gray, green or brown) and/or es...  Dressing Type Silicone dressing;Moist to moist Foam - Lift dressing to assess site every shift  Dressing Changed Clean;Dry;Intact  Dressing Change Frequency PRN (when contaminated)  --   State of Healing Eschar  --   Site / Wound Assessment Brown Dressing in place / Unable to assess  % Wound base Red or Granulating 0%  --   % Wound base Black/Eschar 100%  --   Peri-wound Assessment Maceration  --   Wound Length (cm) 4.5 cm  --   Wound Width (cm) 3.5 cm  --   Wound Depth (cm) 2 cm  --   Wound Surface Area (cm^2) 15.75 cm^2  --   Wound Volume (cm^3) 31.5 cm^3  --   Tunneling (cm) 4.5 (at 5:00)  --   Undermining (cm) 2  --   Margins Unattached edges (unapproximated)  --   Drainage Amount Minimal  --   Drainage Description Serosanguineous;No odor  --   Treatment Hydrotherapy (Pulse lavage);Packing (Saline gauze)  --    Rica Koyanagi  PTA Acute  Rehabilitation Services Pager      [phone] Office      [phone]

## 2020-06-15 NOTE — Progress Notes (Signed)
Pharmacy Antibiotic Note  [name] is a 74 y.o. male recently diagnosed with metastatic prostate cancer presented to the ED on 06/11/2020 for evaluation of perirectal abscess. He was started on vancomycin, cefepime and flagyl on admission and subsequently narrowed to unasyn on 9/2.  Today, 06/15/2020: - day #4 total of abx, day #2 unasyn - scr trending down with 1.95 (crcl~31) - afeb, wbc elevated at 17  Plan: - adjust unasyn dose to 3gm IV q6h - monitor renal function closely - f/u with culture results _______________________________________  Height: 5\' 4"  (162.6 cm) Weight: 81.9 kg (180 lb 8.9 oz) IBW/kg (Calculated) : 59.2  Temp (24hrs), Avg:98 F (36.7 C), Min:97.6 F (36.4 C), Max:98.3 F (36.8 C)  Recent Labs  Lab 06/11/20 1346 06/11/20 1434 06/11/20 1816 06/12/20 0042 06/12/20 0042 06/12/20 0530 06/12/20 0530 06/12/20 0845 06/12/20 1600 06/13/20 0302 06/13/20 0906 06/14/20 0236 06/14/20 0930 06/15/20 0529  WBC  --  25.6*  --   --   --  20.9*  --   --   --   --  16.9*  --  14.6* 17.0*  CREATININE  --  5.02*   < > 5.02*   < > 5.07*   < > 5.04* 4.76* 4.47*  --  3.31*  --  1.95*  LATICACIDVEN 1.3  --   --  0.9  --   --   --   --   --   --   --   --   --   --    < > = values in this interval not displayed.    Estimated Creatinine Clearance: 31.1 mL/min (A) (by C-G formula based on SCr of 1.95 mg/dL (H)).    Allergies  Allergen Reactions  . Altace [Ramipril] Cough    Antimicrobials this admission:  8/30 Zosyn x 1 8/30 Vanc >>9/2 8/31 Cefepime >>9/2 8/31 Flagyl >>9/1 9/2 unasyn>>  Microbiology results:  8/30 BCx2: ngtd 8/30 UCx cath: 40K yeast (chronic indwelling foley)F 8/30 COVID: neg 8/30 Perianus wound: few GPC in clusters, few GNR, normal skin flora FINAL 8/31 MRSA PCR neg   Thank you for allowing pharmacy to be a part of this patient's care.  Lynelle Doctor 06/15/2020 11:24 AM

## 2020-06-15 NOTE — Progress Notes (Signed)
PROGRESS NOTE    [name]   WFU:[id number]  DOB: 24-Oct-1945  DOA: 06/11/2020     4  PCP: [name], MD  CC: Fevers and malaise  Hospital Course: [name] is a 74 yo CM with PMH DMII, CAD s/p CABG, PAD, HTN, HLD, CVA (residual left side weakness), newly diagnosed prostate cancer with bone mets who presented to the hospital on 06/11/20 with fevers and not feeling well for approximately 1 week. He was hypotensive and ultimately considered to be in septic shock from an underlying perirectal wound/abscess that was noted on CT abdomen/pelvis on admission.  There was no other obvious source of infection after ongoing work-up. He does also have a chronic indwelling Foley catheter. He was started on hydrotherapy for his gluteal wound.  Surgery was also consulted and assisted during hospitalization.  He was not felt to require surgical debridement and was recommended to continue hydrotherapy until discharge.  Santyl was ordered for enzymatic debridement with twice daily dressing changes. He ultimately stabilized and was able to come off of vasopressor support and his sepsis resolved.  Service was transferred to [facility] from [facility] on 06/14/2020 to continue care.   Interval History:  Ex-wife present at bedside this morning who is his main caretaker at home still.  We discussed ongoing hydrotherapy during hospitalization as well as wound care that will take place at discharge.  He also was recommended for SNF per PT, however given his bedbound state at home and almost full dependence on ex-wife for all ADLs, he likely would not benefit much from rehab and they do not want to transition to long-term skilled care at this time.  Therefore, they wish to return home at time of discharge.  We reviewed his improving labs and further medical stability.  Discussed that he may be approaching discharge in the next 2 to 3 days, which they are amenable with.  In the meantime we will continue on hydrotherapy  during hospitalization and ongoing wound care.  All questions and concerns addressed at bedside.  Old records reviewed in assessment of this patient  ROS: Constitutional: positive for fatigue, Respiratory: negative for cough, Cardiovascular: negative for chest pain and Gastrointestinal: negative for abdominal pain  Assessment & Plan: Severe sepsis with septic shock ([place]) -Initially admitted to the ICU and started on vasopressor support after fluid resuscitation.  Source considered gluteal wound given no other obvious source found -Has been treated with vancomycin, cefepime, Flagyl -Will further de-escalate down to Unasyn at this time, on 06/14/2020  Pressure ulcer of buttock, unstageable ([place]) -Continue hydrotherapy until discharge per surgery -Continue Santyl with twice daily dressing changes -Per surgery, no surgical debridement at this time  Prostate cancer metastatic to bone [facility]) -Recent diagnosis in July 2021 -Appears to be following outpatient with urology with plans to start on androgen deprivation therapy -Also evaluated by oncology inpatient, appreciate assistance.  Plan will also be to continue on same course of treatment at discharge with no further oncology needs at this time  DKA (diabetic ketoacidoses) ([place]) -Present on admission and has since resolved -Continue SSI and CBG monitoring  Acute renal failure superimposed on stage 3a chronic kidney disease ([place]) - baseline creat approx 1.9 -Considered prerenal with some possible ATN on admission -Creatinine slowly improving, continue to trend; appears back to baseline at this time - IVF changed to LR per nephro rec; once patient taking in better nutrition will d/c IVF  Hyponatremia - follow BMP daily  Normocytic anemia -Received 1 unit  PRBC on 06/12/2020 for hemoglobin of 6 g/dL -Hemoglobin has remained stable and no further transfusions required -Daily CBC  History of stroke -Residual left-sided weakness with  clenching of left fist  Seizure disorder (HCC) -Continue Keppra  Acute on chronic diastolic CHF (congestive heart failure) ([place]) -Echo performed on 06/13/2020.  EF 60 to 65% with grade 2 diastolic dysfunction -BNP elevated on 06/11/2020 (1002) -Has not received diuresis in setting of recent septic shock however is now starting to pick up urine output, likely in diuretic phase of ATN -Continue monitoring for any significant signs of volume overload   Antimicrobials: Zosyn 8/30>>8/30 Vancomycin 8/30>> 06/14/2020 Flagyl 8/30-8/31 Cefepime 06/12/2020>> 06/14/2020 Unasyn 06/14/2020>> current  DVT prophylaxis: Heparin Code Status: Full Family Communication: None present Disposition Plan: Status is: Inpatient  Remains inpatient appropriate because:Unsafe d/c plan, IV treatments appropriate due to intensity of illness or inability to take PO and Inpatient level of care appropriate due to severity of illness   Dispo: The patient is from: Home              Anticipated d/c is to: Pending PT eval's              Anticipated d/c date is: > 3 days              Patient currently is not medically stable to d/c. Objective: Blood pressure (!) 158/65, pulse 91, temperature 97.8 F (36.6 C), temperature source Oral, resp. rate 17, height 5\' 4"  (1.626 m), weight 81.9 kg, SpO2 97 %.  Examination: General appearance: Chronically ill-appearing elderly man laying in bed comfortable and in no obvious distress Head: Normocephalic, without obvious abnormality Eyes: EOMI Lungs: clear to auscultation bilaterally Heart: regular rate and rhythm and S1, S2 normal Abdomen: normal findings: bowel sounds normal and soft, non-tender Extremities: Contracted left upper extremity with left fist clenched Skin: mobility and turgor normal Neurologic: Contracted left upper extremity and difficult to assess strength but approximately 4/5.  Right upper extremity is also weak from what appears to be deconditioning but is  3+/5  Consultants:   Surgery  Nephrology  Critical care  Oncology  Data Reviewed: I have personally reviewed following labs and imaging studies Results for orders placed or performed during the hospital encounter of 06/11/20 (from the past 24 hour(s))  Glucose, capillary     Status: Abnormal   Collection Time: 06/14/20  6:01 PM  Result Value Ref Range   Glucose-Capillary 135 (H) 70 - 99 mg/dL   Comment 1 Notify RN    Comment 2 Document in Chart   Glucose, capillary     Status: Abnormal   Collection Time: 06/14/20 10:06 PM  Result Value Ref Range   Glucose-Capillary 135 (H) 70 - 99 mg/dL  CBC with Differential/Platelet     Status: Abnormal   Collection Time: 06/15/20  5:29 AM  Result Value Ref Range   WBC 17.0 (H) 4.0 - 10.5 K/uL   RBC 3.47 (L) 4.22 - 5.81 MIL/uL   Hemoglobin 10.3 (L) 13.0 - 17.0 g/dL   HCT 29.5 (L) 39 - 52 %   MCV 85.0 80.0 - 100.0 fL   MCH 29.7 26.0 - 34.0 pg   MCHC 34.9 30.0 - 36.0 g/dL   RDW 20.5 (H) 11.5 - 15.5 %   Platelets 379 150 - 400 K/uL   nRBC 0.0 0.0 - 0.2 %   Neutrophils Relative % 79 %   Neutro Abs 13.5 (H) 1.7 - 7.7 K/uL   Lymphocytes Relative 6 %  Lymphs Abs 0.9 0.7 - 4.0 K/uL   Monocytes Relative 10 %   Monocytes Absolute 1.6 (H) 0 - 1 K/uL   Eosinophils Relative 1 %   Eosinophils Absolute 0.2 0 - 0 K/uL   Basophils Relative 0 %   Basophils Absolute 0.1 0 - 0 K/uL   Immature Granulocytes 4 %   Abs Immature Granulocytes 0.68 (H) 0.00 - 0.07 K/uL  Magnesium     Status: Abnormal   Collection Time: 06/15/20  5:29 AM  Result Value Ref Range   Magnesium 1.6 (L) 1.7 - 2.4 mg/dL  Phosphorus     Status: None   Collection Time: 06/15/20  5:29 AM  Result Value Ref Range   Phosphorus 3.7 2.5 - 4.6 mg/dL  Basic metabolic panel     Status: Abnormal   Collection Time: 06/15/20  5:29 AM  Result Value Ref Range   Sodium 138 135 - 145 mmol/L   Potassium 5.0 3.5 - 5.1 mmol/L   Chloride 111 98 - 111 mmol/L   CO2 16 (L) 22 - 32 mmol/L    Glucose, Bld 116 (H) 70 - 99 mg/dL   BUN 63 (H) 8 - 23 mg/dL   Creatinine, Ser 1.95 (H) 0.61 - 1.24 mg/dL   Calcium 8.5 (L) 8.9 - 10.3 mg/dL   GFR calc non Af Amer 32 (L) >60 mL/min   GFR calc Af Amer 38 (L) >60 mL/min   Anion gap 11 5 - 15  Glucose, capillary     Status: Abnormal   Collection Time: 06/15/20  7:32 AM  Result Value Ref Range   Glucose-Capillary 105 (H) 70 - 99 mg/dL  Glucose, capillary     Status: Abnormal   Collection Time: 06/15/20 11:48 AM  Result Value Ref Range   Glucose-Capillary 118 (H) 70 - 99 mg/dL    Recent Results (from the past 240 hour(s))  Culture, blood (Routine x 2)     Status: None (Preliminary result)   Collection Time: 06/11/20  2:34 PM   Specimen: BLOOD  Result Value Ref Range Status   Specimen Description   Final    BLOOD RIGHT ANTECUBITAL Performed at [facility], [place] [address] [place], [place] [address]    Special Requests   Final    BOTTLES DRAWN AEROBIC AND ANAEROBIC Blood Culture adequate volume Performed at [place] [address], [place], [place] [address]    Culture   Final    NO GROWTH 4 DAYS Performed at [facility], [place] [address], [place], [place] [address]    Report Status PENDING  Incomplete  Culture, blood (Routine x 2)     Status: None (Preliminary result)   Collection Time: 06/11/20  2:35 PM   Specimen: BLOOD RIGHT FOREARM  Result Value Ref Range Status   Specimen Description   Final    BLOOD RIGHT FOREARM Performed at [place] [address] [place], [place] [address]    Special Requests   Final    BOTTLES DRAWN AEROBIC AND ANAEROBIC Blood Culture adequate volume Performed at [place] [address], [place], [place] [address]    Culture   Final    NO GROWTH 4 DAYS Performed at [facility], [place] [address] [place],  [address]    Report Status PENDING  Incomplete  Urine culture     Status: Abnormal    Collection Time: 06/11/20  3:19 PM   Specimen: Urine,  Catheterized  Result Value Ref Range Status   Specimen Description   Final    URINE, CATHETERIZED Performed at [place] [address], [place], [place] [address]    Special Requests   Final    NONE Performed at [facility], [place] [address] [place], [place] [address]    Culture (A)  Final    40,000 COLONIES/mL YEAST Performed at [facility], [place] [address], [place], [place] [address]    Report Status 06/13/2020 FINAL  Final  SARS Coronavirus 2 by RT PCR (hospital order, performed in [facility] hospital lab) Nasopharyngeal Nasopharyngeal Swab     Status: None   Collection Time: 06/11/20  3:42 PM   Specimen: Nasopharyngeal Swab  Result Value Ref Range Status   SARS Coronavirus 2 NEGATIVE NEGATIVE Final    Comment: (NOTE) SARS-CoV-2 target nucleic acids are NOT DETECTED.  The SARS-CoV-2 RNA is generally detectable in upper and lower respiratory specimens during the acute phase of infection. The lowest concentration of SARS-CoV-2 viral copies this assay can detect is 250 copies / mL. A negative result does not preclude SARS-CoV-2 infection and should not be used as the sole basis for treatment or other patient management decisions.  A negative result may occur with improper specimen collection / handling, submission of specimen other than nasopharyngeal swab, presence of viral mutation(s) within the areas targeted by this assay, and inadequate number of viral copies (<250 copies / mL). A negative result must be combined with clinical observations, patient history, and epidemiological information.  Fact Sheet for Patients:   StrictlyIdeas.no  Fact Sheet for Healthcare Providers: BankingDealers.co.za  This test is not yet approved or  cleared by the [place] FDA and has been authorized for detection and/or diagnosis of  SARS-CoV-2 by FDA under an Emergency Use Authorization (EUA).  This EUA will remain in effect (meaning this test can be used) for the duration of the COVID-19 declaration under Section 564(b)(1) of the Act, 21 U.S.C. section 360bbb-3(b)(1), unless the authorization is terminated or revoked sooner.  Performed at [facility], [place] [address] [place], [place] [address]   Aerobic Culture (superficial specimen)     Status: None   Collection Time: 06/11/20  5:24 PM   Specimen: Wound  Result Value Ref Range Status   Specimen Description   Final    WOUND PERIANUS Performed at [place] [address] [place], [place] [address]    Special Requests   Final    NONE Performed at [facility], [place] [address] [place], [place] [address]    Gram Stain   Final    FEW WBC PRESENT, PREDOMINANTLY PMN FEW GRAM POSITIVE COCCI IN CLUSTERS FEW GRAM NEGATIVE RODS    Culture   Final    NORMAL SKIN FLORA NO STAPHYLOCOCCUS AUREUS ISOLATED NO GROUP A STREP (S.PYOGENES) ISOLATED Performed at [facility], [address], [place], [place] [address]    Report Status 06/14/2020 FINAL  Final  MRSA PCR Screening     Status: None   Collection Time: 06/13/20  9:19 AM   Specimen: Nasal Mucosa; Nasopharyngeal  Result Value Ref Range Status   MRSA by PCR NEGATIVE NEGATIVE Final    Comment:        The GeneXpert MRSA Assay (FDA approved for NASAL specimens only), is one component of a comprehensive MRSA colonization surveillance program. It is not intended to diagnose MRSA infection nor to guide  or monitor treatment for MRSA infections. Performed at [facility], [place] [address], [place],  [address]      Radiology Studies: No results found. CT ABDOMEN PELVIS WO CONTRAST  Final Result  Addendum 1 of 1  ADDENDUM REPORT: 06/11/2020 21:43    ADDENDUM:  Few borderline enlarged retroperitoneal lymph nodes,  stable to  decreased in size from comparison imaging.      Electronically Signed    By: Lovena Le M.D.    On: 06/11/2020 21:43      Final    DG Chest [facility]  Final Result    DG Chest Port 1 View  Final Result      Scheduled Meds: . carbidopa-levodopa  2 tablet Oral TID  . chlorhexidine  15 mL Mouth Rinse BID  . Chlorhexidine Gluconate Cloth  6 each Topical Daily  . collagenase   Topical BID  . dantrolene  50 mg Oral BID  . dipyridamole-aspirin  1 capsule Oral BID  . heparin  5,000 Units Subcutaneous Q8H  . insulin aspart  0-5 Units Subcutaneous QHS  . insulin aspart  0-9 Units Subcutaneous TID WC  . insulin aspart  3 Units Subcutaneous TID WC  . insulin detemir  0.3 Units/kg Subcutaneous Q24H  . levETIRAcetam  500 mg Oral Daily  . mouth rinse  15 mL Mouth Rinse q12n4p  . polyethylene glycol  17 g Oral Daily  . senna-docusate  1 tablet Oral BID   PRN Meds: dextrose, oxyCODONE-acetaminophen, polyethylene glycol Continuous Infusions: . sodium chloride Stopped (06/12/20 0635)  . ampicillin-sulbactam (UNASYN) IV 3 g (06/15/20 1323)  . lactated ringers        LOS: 4 days  Time spent: Greater than 50% of the 35 minute visit was spent in counseling/coordination of care for the patient as laid out in the A&P.   Dwyane Dee, MD [facility] 06/15/2020, 3:39 PM  Contact via secure chat.  To contact the attending provider between 7A-7P or the covering provider during after hours 7P-7A, please log into the web site [email or web address] and access using universal [place] password for that web site. If you do not have the password, please call the hospital operator.

## 2020-06-16 DIAGNOSIS — E119 Type 2 diabetes mellitus without complications: Secondary | ICD-10-CM

## 2020-06-16 LAB — CBC WITH DIFFERENTIAL/PLATELET
Abs Immature Granulocytes: 0.57 10*3/uL — ABNORMAL HIGH (ref 0.00–0.07)
Basophils Absolute: 0.1 10*3/uL (ref 0.0–0.1)
Basophils Relative: 0 %
Eosinophils Absolute: 0.2 10*3/uL (ref 0.0–0.5)
Eosinophils Relative: 1 %
HCT: 30.9 % — ABNORMAL LOW (ref 39.0–52.0)
Hemoglobin: 10.5 g/dL — ABNORMAL LOW (ref 13.0–17.0)
Immature Granulocytes: 3 %
Lymphocytes Relative: 6 %
Lymphs Abs: 1 10*3/uL (ref 0.7–4.0)
MCH: 30.1 pg (ref 26.0–34.0)
MCHC: 34 g/dL (ref 30.0–36.0)
MCV: 88.5 fL (ref 80.0–100.0)
Monocytes Absolute: 1.5 10*3/uL — ABNORMAL HIGH (ref 0.1–1.0)
Monocytes Relative: 9 %
Neutro Abs: 14.3 10*3/uL — ABNORMAL HIGH (ref 1.7–7.7)
Neutrophils Relative %: 81 %
Platelets: 371 10*3/uL (ref 150–400)
RBC: 3.49 MIL/uL — ABNORMAL LOW (ref 4.22–5.81)
RDW: 20.5 % — ABNORMAL HIGH (ref 11.5–15.5)
WBC: 17.6 10*3/uL — ABNORMAL HIGH (ref 4.0–10.5)
nRBC: 0 % (ref 0.0–0.2)

## 2020-06-16 LAB — GLUCOSE, CAPILLARY
Glucose-Capillary: 103 mg/dL — ABNORMAL HIGH (ref 70–99)
Glucose-Capillary: 171 mg/dL — ABNORMAL HIGH (ref 70–99)
Glucose-Capillary: 93 mg/dL (ref 70–99)
Glucose-Capillary: 117 mg/dL — ABNORMAL HIGH (ref 70–99)

## 2020-06-16 LAB — CULTURE, BLOOD (ROUTINE X 2)
Culture: NO GROWTH
Culture: NO GROWTH
Special Requests: ADEQUATE
Special Requests: ADEQUATE

## 2020-06-16 LAB — BASIC METABOLIC PANEL
Anion gap: 10 (ref 5–15)
BUN: 41 mg/dL — ABNORMAL HIGH (ref 8–23)
CO2: 19 mmol/L — ABNORMAL LOW (ref 22–32)
Calcium: 8.8 mg/dL — ABNORMAL LOW (ref 8.9–10.3)
Chloride: 110 mmol/L (ref 98–111)
Creatinine, Ser: 1.42 mg/dL — ABNORMAL HIGH (ref 0.61–1.24)
GFR calc Af Amer: 55 mL/min — ABNORMAL LOW (ref >60)
GFR calc non Af Amer: 48 mL/min — ABNORMAL LOW (ref >60)
Glucose, Bld: 118 mg/dL — ABNORMAL HIGH (ref 70–99)
Potassium: 4.9 mmol/L (ref 3.5–5.1)
Sodium: 139 mmol/L (ref 135–145)

## 2020-06-16 LAB — MAGNESIUM: Magnesium: 1.5 mg/dL — ABNORMAL LOW (ref 1.7–2.4)

## 2020-06-16 LAB — PHOSPHORUS: Phosphorus: 2.9 mg/dL (ref 2.5–4.6)

## 2020-06-16 MED ORDER — MAGNESIUM OXIDE 400 (241.3 MG) MG PO TABS
800.0000 mg | ORAL_TABLET | Freq: Once | ORAL | Status: AC
  Administered 2020-06-16: 800 mg via ORAL
  Filled 2020-06-16: qty 2

## 2020-06-16 NOTE — Progress Notes (Signed)
PHYSICAL THERAPY Hydrotherapy  06/16/20 1600  Subjective Assessment  Subjective doing  OK Ex wife AKA Care Giver observed today   Patient and Family Stated Goals to go home  Date of Onset  (present)  Prior Treatments dress change at home by caregiver  Evaluation and Treatment  Evaluation and Treatment Procedures Explained to Patient/Family Yes  Evaluation and Treatment Procedures agreed to  Pressure Injury 06/12/20 Buttocks Left Unstageable - Full thickness tissue loss in which the base of the injury is covered by slough (yellow, tan, gray, green or brown) and/or eschar (tan, brown or black) in the wound bed. with tunneling  Date First Assessed/Time First Assessed: 06/12/20 0000   Location: Buttocks  Location Orientation: Left  Staging: Unstageable - Full thickness tissue loss in which the base of the injury is covered by slough (yellow, tan, gray, green or brown) and/or ...  Dressing Type Foam - Lift dressing to assess site every shift  Hydrotherapy  Pulsed lavage therapy - wound location left of rectum  Pulsed Lavage with Suction (psi) 4 psi  Pulsed Lavage with Suction - Normal Saline Used 1000 mL  Pulsed Lavage Tip Tip with splash shield  Wound Therapy - Assess/Plan/Recommendations  Wound Therapy - Clinical Statement Patient tolerated well, Patient will benefit from a PT consult to assess mobility. Patient has multiple mets to pelvis, spine, ribs so unsure if there are any precautions.  Continue HYdro therapy.. If patient DC's home, recommend  low air loss mattress for his hospital bed. Has a WC.  Wound Therapy - Functional Problem List metastatic bladder cancer  with mets to bone:ribs, pelvis, spine., H/O seizure D/O. Paatient tolerates hydro treatment, noted small excoriated periwound bleeding. Patient does have some discomfort with debridement so minimized. Patient's caregiver present.  Factors Delaying/Impairing Wound Healing Diabetes Mellitus;Infection - systemic/local;Multiple medical  problems;Incontinence;Altered sensation  Hydrotherapy Plan Dressing change;Patient/family education;Pulsatile lavage with suction  Wound Therapy - Frequency 6X / week  Wound Therapy - Follow Up Recommendations Skilled nursing facility  Wound Plan hydrotherapy  Wound Therapy Goals - Improve the function of patient's integumentary system by progressing the wound(s) through the phases of wound healing by:  Decrease Necrotic Tissue to 50  Increase Granulation Tissue to 50  Improve Drainage Characteristics Min  Additional Wound Therapy Goal reposition off left gluteal area  Goals/treatment plan/discharge plan were made with and agreed upon by patient/family Yes  Time For Goal Achievement 2 weeks  Rica Koyanagi  PTA Acute  Rehabilitation Services Pager      [phone] Office      [phone]

## 2020-06-16 NOTE — Progress Notes (Signed)
PROGRESS NOTE    [name]   KPT:[id number]  DOB: [birth year]  DOA: 06/11/2020     5  PCP: [name], MD  CC: Fevers and malaise  Hospital Course: [name] is a 74 yo CM with PMH DMII, CAD s/p CABG, PAD, HTN, HLD, CVA (residual left side weakness), newly diagnosed prostate cancer with bone mets who presented to the hospital on 06/11/20 with fevers and not feeling well for approximately 1 week. He was hypotensive and ultimately considered to be in septic shock from an underlying perirectal wound/abscess that was noted on CT abdomen/pelvis on admission.  There was no other obvious source of infection after ongoing work-up. He does also have a chronic indwelling Foley catheter. He was started on hydrotherapy for his gluteal wound.  Surgery was also consulted and assisted during hospitalization.  He was not felt to require surgical debridement and was recommended to continue hydrotherapy until discharge.  Santyl was ordered for enzymatic debridement with twice daily dressing changes. He ultimately stabilized and was able to come off of vasopressor support and his sepsis resolved.  Service was transferred to [facility] from [facility] on 06/14/2020 to continue care.   Interval History:  No acute events overnight.  Patient very comfortable and resting in bed with his ex-wife bedside once again.  Reviewed labs together; they are still very happy over improving renal function.  Discussed plan for ongoing hydrotherapy in the hospital over the weekend and probable discharge home on Monday.  They are both in agreement with this plan.  Old records reviewed in assessment of this patient  ROS: Constitutional: positive for fatigue, Respiratory: negative for cough, Cardiovascular: negative for chest pain and Gastrointestinal: negative for abdominal pain  Assessment & Plan: Severe sepsis with septic shock ([place]) -Initially admitted to the ICU and started on vasopressor support after fluid resuscitation.   Source considered gluteal wound given no other obvious source found -Has been treated with vancomycin, cefepime, Flagyl -Will further de-escalate down to Unasyn at this time, on 06/14/2020  Pressure ulcer of buttock, unstageable ([place]) -Continue hydrotherapy until discharge per surgery -Continue Santyl with twice daily dressing changes -Per surgery, no surgical debridement at this time  Prostate cancer metastatic to bone [facility]) -Recent diagnosis in July 2021 -Appears to be following outpatient with urology with plans to start on androgen deprivation therapy -Also evaluated by oncology inpatient, appreciate assistance.  Plan will also be to continue on same course of treatment at discharge with no further oncology needs at this time  DKA (diabetic ketoacidoses) ([place]) -Present on admission and has since resolved -Continue SSI and CBG monitoring  Acute renal failure superimposed on stage 3a chronic kidney disease (HCC) - baseline creat approx 1.9 -Considered prerenal with some possible ATN on admission -Creatinine slowly improving, continue to trend; appears back to baseline at this time - IVF changed to LR per nephro rec; once patient taking in better nutrition will d/c IVF  Hyponatremia - follow BMP daily  Normocytic anemia -Received 1 unit PRBC on 06/12/2020 for hemoglobin of 6 g/dL -Hemoglobin has remained stable and no further transfusions required -Daily CBC  History of stroke -Residual left-sided weakness with clenching of left fist  Seizure disorder (HCC) -Continue Keppra  Acute on chronic diastolic CHF (congestive heart failure) ([place]) -Echo performed on 06/13/2020.  EF 60 to 65% with grade 2 diastolic dysfunction -BNP elevated on 06/11/2020 (1002) -Has not received diuresis in setting of recent septic shock however is now starting to pick up urine output,  likely in diuretic phase of ATN -Continue monitoring for any significant signs of volume overload  Type 2 diabetes  mellitus without complication (HCC) - continue SSI and CBG monitoring    Antimicrobials: Zosyn 8/30>>8/30 Vancomycin 8/30>> 06/14/2020 Flagyl 8/30-8/31 Cefepime 06/12/2020>> 06/14/2020 Unasyn 06/14/2020>> current  DVT prophylaxis: Heparin Code Status: Full Family Communication: None present Disposition Plan: Status is: Inpatient  Remains inpatient appropriate because:Unsafe d/c plan, IV treatments appropriate due to intensity of illness or inability to take PO and Inpatient level of care appropriate due to severity of illness   Dispo: The patient is from: Home              Anticipated d/c is to: Pending PT eval's              Anticipated d/c date is: > 3 days              Patient currently is not medically stable to d/c. Objective: Blood pressure (!) 163/74, pulse 97, temperature 98.2 F (36.8 C), resp. rate (!) 24, height 5\' 4"  (1.626 m), weight 78.1 kg, SpO2 95 %.  Examination: General appearance: Chronically ill-appearing elderly man laying in bed comfortable and in no obvious distress Head: Normocephalic, without obvious abnormality Eyes: EOMI Lungs: clear to auscultation bilaterally Heart: regular rate and rhythm and S1, S2 normal Abdomen: normal findings: bowel sounds normal and soft, non-tender Extremities: Contracted left upper extremity with left fist clenched Skin: mobility and turgor normal Neurologic: Contracted left upper extremity and difficult to assess strength but approximately 4/5.  Right upper extremity is also weak from what appears to be deconditioning but is 3+/5  Consultants:   Surgery  Nephrology  Critical care  Oncology  Data Reviewed: I have personally reviewed following labs and imaging studies Results for orders placed or performed during the hospital encounter of 06/11/20 (from the past 24 hour(s))  Glucose, capillary     Status: Abnormal   Collection Time: 06/15/20  4:32 PM  Result Value Ref Range   Glucose-Capillary 153 (H) 70 - 99 mg/dL   Glucose, capillary     Status: Abnormal   Collection Time: 06/15/20  9:02 PM  Result Value Ref Range   Glucose-Capillary 165 (H) 70 - 99 mg/dL  CBC with Differential/Platelet     Status: Abnormal   Collection Time: 06/16/20  5:54 AM  Result Value Ref Range   WBC 17.6 (H) 4.0 - 10.5 K/uL   RBC 3.49 (L) 4.22 - 5.81 MIL/uL   Hemoglobin 10.5 (L) 13.0 - 17.0 g/dL   HCT 30.9 (L) 39 - 52 %   MCV 88.5 80.0 - 100.0 fL   MCH 30.1 26.0 - 34.0 pg   MCHC 34.0 30.0 - 36.0 g/dL   RDW 20.5 (H) 11.5 - 15.5 %   Platelets 371 150 - 400 K/uL   nRBC 0.0 0.0 - 0.2 %   Neutrophils Relative % 81 %   Neutro Abs 14.3 (H) 1.7 - 7.7 K/uL   Lymphocytes Relative 6 %   Lymphs Abs 1.0 0.7 - 4.0 K/uL   Monocytes Relative 9 %   Monocytes Absolute 1.5 (H) 0 - 1 K/uL   Eosinophils Relative 1 %   Eosinophils Absolute 0.2 0 - 0 K/uL   Basophils Relative 0 %   Basophils Absolute 0.1 0 - 0 K/uL   Immature Granulocytes 3 %   Abs Immature Granulocytes 0.57 (H) 0.00 - 0.07 K/uL  Magnesium     Status: Abnormal   Collection Time: 06/16/20  5:54 AM  Result Value Ref Range   Magnesium 1.5 (L) 1.7 - 2.4 mg/dL  Phosphorus     Status: None   Collection Time: 06/16/20  5:54 AM  Result Value Ref Range   Phosphorus 2.9 2.5 - 4.6 mg/dL  Basic metabolic panel     Status: Abnormal   Collection Time: 06/16/20  5:54 AM  Result Value Ref Range   Sodium 139 135 - 145 mmol/L   Potassium 4.9 3.5 - 5.1 mmol/L   Chloride 110 98 - 111 mmol/L   CO2 19 (L) 22 - 32 mmol/L   Glucose, Bld 118 (H) 70 - 99 mg/dL   BUN 41 (H) 8 - 23 mg/dL   Creatinine, Ser 1.42 (H) 0.61 - 1.24 mg/dL   Calcium 8.8 (L) 8.9 - 10.3 mg/dL   GFR calc non Af Amer 48 (L) >60 mL/min   GFR calc Af Amer 55 (L) >60 mL/min   Anion gap 10 5 - 15  Glucose, capillary     Status: Abnormal   Collection Time: 06/16/20  8:03 AM  Result Value Ref Range   Glucose-Capillary 103 (H) 70 - 99 mg/dL  Glucose, capillary     Status: Abnormal   Collection Time: 06/16/20 11:37  AM  Result Value Ref Range   Glucose-Capillary 171 (H) 70 - 99 mg/dL    Recent Results (from the past 240 hour(s))  Culture, blood (Routine x 2)     Status: None (Preliminary result)   Collection Time: 06/11/20  2:34 PM   Specimen: BLOOD  Result Value Ref Range Status   Specimen Description   Final    BLOOD RIGHT ANTECUBITAL Performed at [facility], [place] [address], [place], [place] [address]    Special Requests   Final    BOTTLES DRAWN AEROBIC AND ANAEROBIC Blood Culture adequate volume Performed at [place] [address], [place], [place] [address]    Culture   Final    NO GROWTH 4 DAYS Performed at [facility], [place] [address] [place], [place] [address]    Report Status PENDING  Incomplete  Culture, blood (Routine x 2)     Status: None (Preliminary result)   Collection Time: 06/11/20  2:35 PM   Specimen: BLOOD RIGHT FOREARM  Result Value Ref Range Status   Specimen Description   Final    BLOOD RIGHT FOREARM Performed at [place] [address], [place], [place] [address]    Special Requests   Final    BOTTLES DRAWN AEROBIC AND ANAEROBIC Blood Culture adequate volume Performed at [place] [address] [place], [place] [address]    Culture   Final    NO GROWTH 4 DAYS Performed at [facility], [place] [address], [place], [place] [address]    Report Status PENDING  Incomplete  Urine culture     Status: Abnormal   Collection Time: 06/11/20  3:19 PM   Specimen: Urine, Catheterized  Result Value Ref Range Status   Specimen Description   Final    URINE, CATHETERIZED Performed at [place] [address], [place], [place] [address]    Special Requests   Final    NONE Performed at [facility], [place] [address], [place], [place] [address]    Culture (A)  Final    40,000 COLONIES/mL YEAST Performed at [facility],  [place] [address], [place], [place] [address]  Report Status 06/13/2020 FINAL  Final  SARS Coronavirus 2 by RT PCR (hospital order, performed in [facility] hospital lab) Nasopharyngeal Nasopharyngeal Swab     Status: None   Collection Time: 06/11/20  3:42 PM   Specimen: Nasopharyngeal Swab  Result Value Ref Range Status   SARS Coronavirus 2 NEGATIVE NEGATIVE Final    Comment: (NOTE) SARS-CoV-2 target nucleic acids are NOT DETECTED.  The SARS-CoV-2 RNA is generally detectable in upper and lower respiratory specimens during the acute phase of infection. The lowest concentration of SARS-CoV-2 viral copies this assay can detect is 250 copies / mL. A negative result does not preclude SARS-CoV-2 infection and should not be used as the sole basis for treatment or other patient management decisions.  A negative result may occur with improper specimen collection / handling, submission of specimen other than nasopharyngeal swab, presence of viral mutation(s) within the areas targeted by this assay, and inadequate number of viral copies (<250 copies / mL). A negative result must be combined with clinical observations, patient history, and epidemiological information.  Fact Sheet for Patients:   StrictlyIdeas.no  Fact Sheet for Healthcare Providers: BankingDealers.co.za  This test is not yet approved or  cleared by the [place] FDA and has been authorized for detection and/or diagnosis of SARS-CoV-2 by FDA under an Emergency Use Authorization (EUA).  This EUA will remain in effect (meaning this test can be used) for the duration of the COVID-19 declaration under Section 564(b)(1) of the Act, 21 U.S.C. section 360bbb-3(b)(1), unless the authorization is terminated or revoked sooner.  Performed at [facility], [place] [address] [place], [place] [address]   Aerobic Culture (superficial specimen)     Status: None   Collection  Time: 06/11/20  5:24 PM   Specimen: Wound  Result Value Ref Range Status   Specimen Description   Final    WOUND PERIANUS Performed at [place] [address] [place], [place] [address]    Special Requests   Final    NONE Performed at [facility], [place] [address], [place], [place] [address]    Gram Stain   Final    FEW WBC PRESENT, PREDOMINANTLY PMN FEW GRAM POSITIVE COCCI IN CLUSTERS FEW GRAM NEGATIVE RODS    Culture   Final    NORMAL SKIN FLORA NO STAPHYLOCOCCUS AUREUS ISOLATED NO GROUP A STREP (S.PYOGENES) ISOLATED Performed at [facility], [address], [place], [place] [address]    Report Status 06/14/2020 FINAL  Final  MRSA PCR Screening     Status: None   Collection Time: 06/13/20  9:19 AM   Specimen: Nasal Mucosa; Nasopharyngeal  Result Value Ref Range Status   MRSA by PCR NEGATIVE NEGATIVE Final    Comment:        The GeneXpert MRSA Assay (FDA approved for NASAL specimens only), is one component of a comprehensive MRSA colonization surveillance program. It is not intended to diagnose MRSA infection nor to guide or monitor treatment for MRSA infections. Performed at [facility], [place] [address], [place], [place]  [address]      Radiology Studies: No results found. CT ABDOMEN PELVIS WO CONTRAST  Final Result  Addendum 1 of 1  ADDENDUM REPORT: 06/11/2020 21:43    ADDENDUM:  Few borderline enlarged retroperitoneal lymph nodes, stable to  decreased in size from comparison imaging.      Electronically Signed    By: Lovena Le M.D.    On:  06/11/2020 21:43      Final    DG Chest Port 1 View  Final Result    DG Chest Port 1 View  Final Result      Scheduled Meds: . carbidopa-levodopa  2 tablet Oral TID  . chlorhexidine  15 mL Mouth Rinse BID  . Chlorhexidine Gluconate Cloth  6 each Topical Daily  . collagenase   Topical BID  . dantrolene  50 mg Oral BID  .  dipyridamole-aspirin  1 capsule Oral BID  . heparin  5,000 Units Subcutaneous Q8H  . insulin aspart  0-5 Units Subcutaneous QHS  . insulin aspart  0-9 Units Subcutaneous TID WC  . insulin aspart  3 Units Subcutaneous TID WC  . insulin detemir  0.3 Units/kg Subcutaneous Q24H  . levETIRAcetam  500 mg Oral Daily  . mouth rinse  15 mL Mouth Rinse q12n4p  . polyethylene glycol  17 g Oral Daily  . senna-docusate  1 tablet Oral BID   PRN Meds: dextrose, melatonin, oxyCODONE-acetaminophen, polyethylene glycol Continuous Infusions: . sodium chloride Stopped (06/12/20 0635)  . ampicillin-sulbactam (UNASYN) IV 3 g (06/16/20 1308)  . lactated ringers Stopped (06/15/20 1844)      LOS: 5 days  Time spent: Greater than 50% of the 35 minute visit was spent in counseling/coordination of care for the patient as laid out in the A&P.   Dwyane Dee, MD [facility] 06/16/2020, 3:10 PM  Contact via secure chat.  To contact the attending provider between 7A-7P or the covering provider during after hours 7P-7A, please log into the web site [email or web address] and access using universal [place] password for that web site. If you do not have the password, please call the hospital operator.

## 2020-06-16 NOTE — Progress Notes (Signed)
Patient's vitals: 20F;HR100;RR22;174/76;94% room air.  The PCP was notified of these vital signs.Awaiting any new orders.

## 2020-06-16 NOTE — Assessment & Plan Note (Signed)
-   continue SSI and CBG monitoring  

## 2020-06-17 LAB — BASIC METABOLIC PANEL
Anion gap: 12 (ref 5–15)
BUN: 31 mg/dL — ABNORMAL HIGH (ref 8–23)
CO2: 20 mmol/L — ABNORMAL LOW (ref 22–32)
Calcium: 8.8 mg/dL — ABNORMAL LOW (ref 8.9–10.3)
Chloride: 107 mmol/L (ref 98–111)
Creatinine, Ser: 1.25 mg/dL — ABNORMAL HIGH (ref 0.61–1.24)
GFR calc Af Amer: >60 mL/min (ref >60)
GFR calc non Af Amer: 56 mL/min — ABNORMAL LOW (ref >60)
Glucose, Bld: 94 mg/dL (ref 70–99)
Potassium: 5.7 mmol/L — ABNORMAL HIGH (ref 3.5–5.1)
Sodium: 139 mmol/L (ref 135–145)

## 2020-06-17 LAB — CBC WITH DIFFERENTIAL/PLATELET
Abs Immature Granulocytes: 0.32 10*3/uL — ABNORMAL HIGH (ref 0.00–0.07)
Basophils Absolute: 0.1 10*3/uL (ref 0.0–0.1)
Basophils Relative: 0 %
Eosinophils Absolute: 0.2 10*3/uL (ref 0.0–0.5)
Eosinophils Relative: 1 %
HCT: 31.3 % — ABNORMAL LOW (ref 39.0–52.0)
Hemoglobin: 10.4 g/dL — ABNORMAL LOW (ref 13.0–17.0)
Immature Granulocytes: 2 %
Lymphocytes Relative: 8 %
Lymphs Abs: 1.3 10*3/uL (ref 0.7–4.0)
MCH: 29.3 pg (ref 26.0–34.0)
MCHC: 33.2 g/dL (ref 30.0–36.0)
MCV: 88.2 fL (ref 80.0–100.0)
Monocytes Absolute: 1.4 10*3/uL — ABNORMAL HIGH (ref 0.1–1.0)
Monocytes Relative: 9 %
Neutro Abs: 12.3 10*3/uL — ABNORMAL HIGH (ref 1.7–7.7)
Neutrophils Relative %: 80 %
Platelets: 379 10*3/uL (ref 150–400)
RBC: 3.55 MIL/uL — ABNORMAL LOW (ref 4.22–5.81)
RDW: 21.1 % — ABNORMAL HIGH (ref 11.5–15.5)
WBC: 15.5 10*3/uL — ABNORMAL HIGH (ref 4.0–10.5)
nRBC: 0 % (ref 0.0–0.2)

## 2020-06-17 LAB — GLUCOSE, CAPILLARY
Glucose-Capillary: 98 mg/dL (ref 70–99)
Glucose-Capillary: 127 mg/dL — ABNORMAL HIGH (ref 70–99)
Glucose-Capillary: 100 mg/dL — ABNORMAL HIGH (ref 70–99)
Glucose-Capillary: 116 mg/dL — ABNORMAL HIGH (ref 70–99)

## 2020-06-17 LAB — MAGNESIUM: Magnesium: 1.3 mg/dL — ABNORMAL LOW (ref 1.7–2.4)

## 2020-06-17 LAB — PHOSPHORUS: Phosphorus: 3 mg/dL (ref 2.5–4.6)

## 2020-06-17 MED ORDER — MAGNESIUM OXIDE 400 (241.3 MG) MG PO TABS
800.0000 mg | ORAL_TABLET | Freq: Once | ORAL | Status: AC
  Administered 2020-06-17: 800 mg via ORAL
  Filled 2020-06-17: qty 2

## 2020-06-17 MED ORDER — HYDRALAZINE HCL 25 MG PO TABS
25.0000 mg | ORAL_TABLET | Freq: Three times a day (TID) | ORAL | Status: DC
  Administered 2020-06-17 – 2020-06-18 (×4): 25 mg via ORAL
  Filled 2020-06-17 (×4): qty 1

## 2020-06-17 MED ORDER — LABETALOL HCL 5 MG/ML IV SOLN
10.0000 mg | INTRAVENOUS | Status: DC | PRN
  Administered 2020-06-17: 10 mg via INTRAVENOUS
  Filled 2020-06-17: qty 4

## 2020-06-17 MED ORDER — HYDRALAZINE HCL 25 MG PO TABS: 25.0000 mg | ORAL_TABLET | ORAL | Status: DC | PRN

## 2020-06-17 NOTE — Progress Notes (Signed)
PROGRESS NOTE    JULIA KULZER   WPY:[id number]  DOB: Mar 05, 1946  DOA: 06/11/2020     6  PCP: [name], MD  CC: Fevers and malaise  Hospital Course: Mr. Gettel is a 74 yo CM with PMH DMII, CAD s/p CABG, PAD, HTN, HLD, CVA (residual left side weakness), newly diagnosed prostate cancer with bone mets who presented to the hospital on 06/11/20 with fevers and not feeling well for approximately 1 week. He was hypotensive and ultimately considered to be in septic shock from an underlying perirectal wound/abscess that was noted on CT abdomen/pelvis on admission.  There was no other obvious source of infection after ongoing work-up. He does also have a chronic indwelling Foley catheter. He was started on hydrotherapy for his gluteal wound.  Surgery was also consulted and assisted during hospitalization.  He was not felt to require surgical debridement and was recommended to continue hydrotherapy until discharge.  Santyl was ordered for enzymatic debridement with twice daily dressing changes. He ultimately stabilized and was able to come off of vasopressor support and his sepsis resolved.  Service was transferred to [facility] from PCCM on 06/14/2020 to continue care.   Interval History:  No acute events overnight.  Ex-wife is his main caretaker and she is present bedside this morning. Labs updated to them and they were pleased to hear renal function continued to improve.  He's otherwise feeling okay and is planning on going home tomorrow still.   Old records reviewed in assessment of this patient  ROS: Constitutional: positive for fatigue, Respiratory: negative for cough, Cardiovascular: negative for chest pain and Gastrointestinal: negative for abdominal pain  Assessment & Plan: Severe sepsis with septic shock ([place]) -Initially admitted to the ICU and started on vasopressor support after fluid resuscitation.  Source considered gluteal wound given no other obvious source found -Has been  treated with vancomycin, cefepime, Flagyl -Will further de-escalate down to Unasyn at this time, on 06/14/2020  Pressure ulcer of buttock, unstageable ([place]) -Continue hydrotherapy until discharge per surgery -Continue Santyl with twice daily dressing changes -Per surgery, no surgical debridement at this time  Prostate cancer metastatic to bone [facility]) -Recent diagnosis in July 2021 -Appears to be following outpatient with urology with plans to start on androgen deprivation therapy -Also evaluated by oncology inpatient, appreciate assistance.  Plan will also be to continue on same course of treatment at discharge with no further oncology needs at this time  DKA (diabetic ketoacidoses) ([place]) -Present on admission and has since resolved -Continue SSI and CBG monitoring  Acute renal failure superimposed on stage 3a chronic kidney disease ([place]) - baseline creat approx 1.9 -Considered prerenal with some possible ATN on admission -Creatinine slowly improving, continue to trend; appears back to baseline at this time  Hyponatremia - follow BMP daily  Normocytic anemia -Received 1 unit PRBC on 06/12/2020 for hemoglobin of 6 g/dL -Hemoglobin has remained stable and no further transfusions required -Daily CBC  History of stroke -Residual left-sided weakness with clenching of left fist  Seizure disorder (HCC) -Continue Keppra  Acute on chronic diastolic CHF (congestive heart failure) ([place]) -Echo performed on 06/13/2020.  EF 60 to 65% with grade 2 diastolic dysfunction -BNP elevated on 06/11/2020 (1002) -Has not received diuresis in setting of recent septic shock however is now starting to pick up urine output, likely in diuretic phase of ATN -Continue monitoring for any significant signs of volume overload  Type 2 diabetes mellitus without complication ([place]) - continue SSI and CBG monitoring  Antimicrobials: Zosyn 8/30>>8/30 Vancomycin 8/30>> 06/14/2020 Flagyl 8/30-8/31 Cefepime  06/12/2020>> 06/14/2020 Unasyn 06/14/2020>> current  DVT prophylaxis: Heparin Code Status: Full Family Communication: None present Disposition Plan: Status is: Inpatient  Remains inpatient appropriate because:Unsafe d/c plan, IV treatments appropriate due to intensity of illness or inability to take PO and Inpatient level of care appropriate due to severity of illness   Dispo: The patient is from: Home              Anticipated d/c is to: Home              Anticipated d/c date is: 1 day              Patient currently is not medically stable to d/c. Objective: Blood pressure (!) 163/72, pulse (!) 101, temperature 98.6 F (37 C), temperature source Oral, resp. rate (!) 22, height 5\' 4"  (1.626 m), weight 78.1 kg, SpO2 92 %.  Examination: General appearance: Chronically ill-appearing elderly man laying in bed comfortable and in no obvious distress Head: Normocephalic, without obvious abnormality Eyes: EOMI Lungs: clear to auscultation bilaterally Heart: regular rate and rhythm and S1, S2 normal Abdomen: normal findings: bowel sounds normal and soft, non-tender Extremities: Contracted left upper extremity with left fist clenched Skin: mobility and turgor normal Neurologic: Contracted left upper extremity and difficult to assess strength but approximately 4/5.  Right upper extremity is also weak from what appears to be deconditioning but is 3+/5  Consultants:   Surgery  Nephrology  Critical care  Oncology  Data Reviewed: I have personally reviewed following labs and imaging studies Results for orders placed or performed during the hospital encounter of 06/11/20 (from the past 24 hour(s))  Glucose, capillary     Status: None   Collection Time: 06/16/20  5:15 PM  Result Value Ref Range   Glucose-Capillary 93 70 - 99 mg/dL  Glucose, capillary     Status: Abnormal   Collection Time: 06/16/20  9:03 PM  Result Value Ref Range   Glucose-Capillary 117 (H) 70 - 99 mg/dL  CBC with  Differential/Platelet     Status: Abnormal   Collection Time: 06/17/20  5:59 AM  Result Value Ref Range   WBC 15.5 (H) 4.0 - 10.5 K/uL   RBC 3.55 (L) 4.22 - 5.81 MIL/uL   Hemoglobin 10.4 (L) 13.0 - 17.0 g/dL   HCT 31.3 (L) 39 - 52 %   MCV 88.2 80.0 - 100.0 fL   MCH 29.3 26.0 - 34.0 pg   MCHC 33.2 30.0 - 36.0 g/dL   RDW 21.1 (H) 11.5 - 15.5 %   Platelets 379 150 - 400 K/uL   nRBC 0.0 0.0 - 0.2 %   Neutrophils Relative % 80 %   Neutro Abs 12.3 (H) 1.7 - 7.7 K/uL   Lymphocytes Relative 8 %   Lymphs Abs 1.3 0.7 - 4.0 K/uL   Monocytes Relative 9 %   Monocytes Absolute 1.4 (H) 0 - 1 K/uL   Eosinophils Relative 1 %   Eosinophils Absolute 0.2 0 - 0 K/uL   Basophils Relative 0 %   Basophils Absolute 0.1 0 - 0 K/uL   Immature Granulocytes 2 %   Abs Immature Granulocytes 0.32 (H) 0.00 - 0.07 K/uL  Magnesium     Status: Abnormal   Collection Time: 06/17/20  5:59 AM  Result Value Ref Range   Magnesium 1.3 (L) 1.7 - 2.4 mg/dL  Phosphorus     Status: None   Collection Time: 06/17/20  5:59  AM  Result Value Ref Range   Phosphorus 3.0 2.5 - 4.6 mg/dL  Basic metabolic panel     Status: Abnormal   Collection Time: 06/17/20  5:59 AM  Result Value Ref Range   Sodium 139 135 - 145 mmol/L   Potassium 5.7 (H) 3.5 - 5.1 mmol/L   Chloride 107 98 - 111 mmol/L   CO2 20 (L) 22 - 32 mmol/L   Glucose, Bld 94 70 - 99 mg/dL   BUN 31 (H) 8 - 23 mg/dL   Creatinine, Ser 1.25 (H) 0.61 - 1.24 mg/dL   Calcium 8.8 (L) 8.9 - 10.3 mg/dL   GFR calc non Af Amer 56 (L) >60 mL/min   GFR calc Af Amer >60 >60 mL/min   Anion gap 12 5 - 15  Glucose, capillary     Status: None   Collection Time: 06/17/20  7:51 AM  Result Value Ref Range   Glucose-Capillary 98 70 - 99 mg/dL  Glucose, capillary     Status: Abnormal   Collection Time: 06/17/20 11:13 AM  Result Value Ref Range   Glucose-Capillary 127 (H) 70 - 99 mg/dL    Recent Results (from the past 240 hour(s))  Culture, blood (Routine x 2)     Status: None    Collection Time: 06/11/20  2:34 PM   Specimen: BLOOD  Result Value Ref Range Status   Specimen Description   Final    BLOOD RIGHT ANTECUBITAL Performed at [facility], [place] [address], [place], [place] [address]    Special Requests   Final    BOTTLES DRAWN AEROBIC AND ANAEROBIC Blood Culture adequate volume Performed at [place] [address] [place], [place] [address]    Culture   Final    NO GROWTH 5 DAYS Performed at [facility], [place] [address] [place], [place] [address]    Report Status 06/16/2020 FINAL  Final  Culture, blood (Routine x 2)     Status: None   Collection Time: 06/11/20  2:35 PM   Specimen: BLOOD RIGHT FOREARM  Result Value Ref Range Status   Specimen Description   Final    BLOOD RIGHT FOREARM Performed at [place] [address] [place], [place] [address]    Special Requests   Final    BOTTLES DRAWN AEROBIC AND ANAEROBIC Blood Culture adequate volume Performed at [place] [address], [place], [place] [address]    Culture   Final    NO GROWTH 5 DAYS Performed at [facility], [place] [address] [place], [place] [address]    Report Status 06/16/2020 FINAL  Final  Urine culture     Status: Abnormal   Collection Time: 06/11/20  3:19 PM   Specimen: Urine, Catheterized  Result Value Ref Range Status   Specimen Description   Final    URINE, CATHETERIZED Performed at [place] [address], [place], [place] [address]    Special Requests   Final    NONE Performed at [facility], [place] [address] [place], [place] [address]    Culture (A)  Final    40,000 COLONIES/mL YEAST Performed at [facility], [place] [address], [place], [place] [address]    Report Status 06/13/2020 FINAL  Final  SARS Coronavirus 2 by RT PCR (hospital order, performed in [facility] hospital lab) Nasopharyngeal Nasopharyngeal Swab      Status: None   Collection  Time: 06/11/20  3:42 PM   Specimen: Nasopharyngeal Swab  Result Value Ref Range Status   SARS Coronavirus 2 NEGATIVE NEGATIVE Final    Comment: (NOTE) SARS-CoV-2 target nucleic acids are NOT DETECTED.  The SARS-CoV-2 RNA is generally detectable in upper and lower respiratory specimens during the acute phase of infection. The lowest concentration of SARS-CoV-2 viral copies this assay can detect is 250 copies / mL. A negative result does not preclude SARS-CoV-2 infection and should not be used as the sole basis for treatment or other patient management decisions.  A negative result may occur with improper specimen collection / handling, submission of specimen other than nasopharyngeal swab, presence of viral mutation(s) within the areas targeted by this assay, and inadequate number of viral copies (<250 copies / mL). A negative result must be combined with clinical observations, patient history, and epidemiological information.  Fact Sheet for Patients:   StrictlyIdeas.no  Fact Sheet for Healthcare Providers: BankingDealers.co.za  This test is not yet approved or  cleared by the [place] FDA and has been authorized for detection and/or diagnosis of SARS-CoV-2 by FDA under an Emergency Use Authorization (EUA).  This EUA will remain in effect (meaning this test can be used) for the duration of the COVID-19 declaration under Section 564(b)(1) of the Act, 21 U.S.C. section 360bbb-3(b)(1), unless the authorization is terminated or revoked sooner.  Performed at [facility], [place] [address] [place], [place] [address]   Aerobic Culture (superficial specimen)     Status: None   Collection Time: 06/11/20  5:24 PM   Specimen: Wound  Result Value Ref Range Status   Specimen Description   Final    WOUND PERIANUS Performed at [place] [address] [place],  [place] [address]    Special Requests   Final    NONE Performed at [facility], [place] [address] [place], [place] [address]    Gram Stain   Final    FEW WBC PRESENT, PREDOMINANTLY PMN FEW GRAM POSITIVE COCCI IN CLUSTERS FEW GRAM NEGATIVE RODS    Culture   Final    NORMAL SKIN FLORA NO STAPHYLOCOCCUS AUREUS ISOLATED NO GROUP A STREP (S.PYOGENES) ISOLATED Performed at [facility], [address], [place], [place] [address]    Report Status 06/14/2020 FINAL  Final  MRSA PCR Screening     Status: None   Collection Time: 06/13/20  9:19 AM   Specimen: Nasal Mucosa; Nasopharyngeal  Result Value Ref Range Status   MRSA by PCR NEGATIVE NEGATIVE Final    Comment:        The GeneXpert MRSA Assay (FDA approved for NASAL specimens only), is one component of a comprehensive MRSA colonization surveillance program. It is not intended to diagnose MRSA infection nor to guide or monitor treatment for MRSA infections. Performed at [facility], [place] [address] [place], [place] [address]      Radiology Studies: No results found. CT ABDOMEN PELVIS WO CONTRAST  Final Result  Addendum 1 of 1  ADDENDUM REPORT: 06/11/2020 21:43    ADDENDUM:  Few borderline enlarged retroperitoneal lymph nodes, stable to  decreased in size from comparison imaging.      Electronically Signed    By: Lovena Le M.D.    On: 06/11/2020 21:43      Final    DG Chest [facility]  Final Result    DG Chest [facility] 1 View  Final Result  Scheduled Meds: . carbidopa-levodopa  2 tablet Oral TID  . chlorhexidine  15 mL Mouth Rinse BID  . Chlorhexidine Gluconate Cloth  6 each Topical Daily  . collagenase   Topical BID  . dantrolene  50 mg Oral BID  . dipyridamole-aspirin  1 capsule Oral BID  . heparin  5,000 Units Subcutaneous Q8H  . hydrALAZINE  25 mg Oral TID  . insulin aspart  0-5 Units Subcutaneous QHS  . insulin aspart  0-9 Units Subcutaneous TID WC  .  insulin aspart  3 Units Subcutaneous TID WC  . insulin detemir  0.3 Units/kg Subcutaneous Q24H  . levETIRAcetam  500 mg Oral Daily  . mouth rinse  15 mL Mouth Rinse q12n4p  . polyethylene glycol  17 g Oral Daily  . senna-docusate  1 tablet Oral BID   PRN Meds: dextrose, hydrALAZINE, labetalol, melatonin, oxyCODONE-acetaminophen, polyethylene glycol Continuous Infusions: . sodium chloride Stopped (06/12/20 0635)  . ampicillin-sulbactam (UNASYN) IV 3 g (06/17/20 1218)      LOS: 6 days  Time spent: Greater than 50% of the 35 minute visit was spent in counseling/coordination of care for the patient as laid out in the A&P.   Dwyane Dee, MD [facility] 06/17/2020, 2:17 PM  Contact via secure chat.  To contact the attending provider between 7A-7P or the covering provider during after hours 7P-7A, please log into the web site [email or web address] and access using universal [place] password for that web site. If you do not have the password, please call the hospital operator.

## 2020-06-17 NOTE — Progress Notes (Signed)
   06/17/20 1400  Assess: MEWS Score  Temp 98.6 F (37 C)  BP (!) 163/72  Pulse Rate (!) 101  Resp (!) 22  SpO2 92 %  Assess: MEWS Score  MEWS Temp 0  MEWS Systolic 0  MEWS Pulse 1  MEWS RR 1  MEWS LOC 0  MEWS Score 2  MEWS Score Color Yellow  Assess: if the MEWS score is Yellow or Red  Were vital signs taken at a resting state? Yes  Focused Assessment No change from prior assessment  Early Detection of Sepsis Score *See Row Information* High  MEWS guidelines implemented *See Row Information* Yes  Treat  MEWS Interventions Administered prn meds/treatments  Take Vital Signs  Increase Vital Sign Frequency  Yellow: Q 2hr X 2 then Q 4hr X 2, if remains yellow, continue Q 4hrs  Escalate  MEWS: Escalate Yellow: discuss with charge nurse/RN and consider discussing with provider and RRT  Notify: Charge Nurse/RN  Name of Charge Nurse/RN Notified Raquel Sarna  Date Charge Nurse/RN Notified 06/17/20  Time Charge Nurse/RN Notified 1440

## 2020-06-18 LAB — CBC WITH DIFFERENTIAL/PLATELET
Abs Immature Granulocytes: 0.25 10*3/uL — ABNORMAL HIGH (ref 0.00–0.07)
Basophils Absolute: 0 10*3/uL (ref 0.0–0.1)
Basophils Relative: 0 %
Eosinophils Absolute: 0.2 10*3/uL (ref 0.0–0.5)
Eosinophils Relative: 1 %
HCT: 30 % — ABNORMAL LOW (ref 39.0–52.0)
Hemoglobin: 9.8 g/dL — ABNORMAL LOW (ref 13.0–17.0)
Immature Granulocytes: 2 %
Lymphocytes Relative: 8 %
Lymphs Abs: 1.2 10*3/uL (ref 0.7–4.0)
MCH: 29.9 pg (ref 26.0–34.0)
MCHC: 32.7 g/dL (ref 30.0–36.0)
MCV: 91.5 fL (ref 80.0–100.0)
Monocytes Absolute: 1.3 10*3/uL — ABNORMAL HIGH (ref 0.1–1.0)
Monocytes Relative: 9 %
Neutro Abs: 11.2 10*3/uL — ABNORMAL HIGH (ref 1.7–7.7)
Neutrophils Relative %: 80 %
Platelets: 354 10*3/uL (ref 150–400)
RBC: 3.28 MIL/uL — ABNORMAL LOW (ref 4.22–5.81)
RDW: 21.4 % — ABNORMAL HIGH (ref 11.5–15.5)
WBC: 14.1 10*3/uL — ABNORMAL HIGH (ref 4.0–10.5)
nRBC: 0 % (ref 0.0–0.2)

## 2020-06-18 LAB — GLUCOSE, CAPILLARY
Glucose-Capillary: 95 mg/dL (ref 70–99)
Glucose-Capillary: 110 mg/dL — ABNORMAL HIGH (ref 70–99)

## 2020-06-18 LAB — BASIC METABOLIC PANEL
Anion gap: 13 (ref 5–15)
BUN: 29 mg/dL — ABNORMAL HIGH (ref 8–23)
CO2: 18 mmol/L — ABNORMAL LOW (ref 22–32)
Calcium: 8.8 mg/dL — ABNORMAL LOW (ref 8.9–10.3)
Chloride: 102 mmol/L (ref 98–111)
Creatinine, Ser: 1.34 mg/dL — ABNORMAL HIGH (ref 0.61–1.24)
GFR calc Af Amer: 59 mL/min — ABNORMAL LOW (ref >60)
GFR calc non Af Amer: 51 mL/min — ABNORMAL LOW (ref >60)
Glucose, Bld: 101 mg/dL — ABNORMAL HIGH (ref 70–99)
Potassium: 5.4 mmol/L — ABNORMAL HIGH (ref 3.5–5.1)
Sodium: 133 mmol/L — ABNORMAL LOW (ref 135–145)

## 2020-06-18 LAB — MAGNESIUM: Magnesium: 1.5 mg/dL — ABNORMAL LOW (ref 1.7–2.4)

## 2020-06-18 LAB — PHOSPHORUS: Phosphorus: 3.6 mg/dL (ref 2.5–4.6)

## 2020-06-18 MED ORDER — OXYCODONE HCL 5 MG PO TABS: 5.0000 mg | ORAL_TABLET | Freq: Four times a day (QID) | ORAL | 0 refills | Status: DC | PRN

## 2020-06-18 MED ORDER — AMOXICILLIN-POT CLAVULANATE 875-125 MG PO TABS: 1.0000 | ORAL_TABLET | Freq: Two times a day (BID) | ORAL | 0 refills | Status: DC

## 2020-06-18 MED ORDER — MAGNESIUM OXIDE 400 (241.3 MG) MG PO TABS
800.0000 mg | ORAL_TABLET | Freq: Once | ORAL | Status: AC
  Administered 2020-06-18: 800 mg via ORAL
  Filled 2020-06-18: qty 2

## 2020-06-18 NOTE — Progress Notes (Signed)
Manufacturing engineer Documentation  Liaison received referral for pt to participate in [facility] outpt palliative program.   Liaison called pt's room with busy signal.   Palliative team notified of referral. Liaisons to continue to follow to ensure admission visit is set up upon discharge.   Please do not hesitate to outreach with any questions and thank you for the referral.   Freddie Breech, RN  [facility] Liaison [phone]

## 2020-06-18 NOTE — TOC Transition Note (Addendum)
Transition of Care [facility]) - CM/SW Discharge Note   Patient Details  Name: [name] MRN: [id number] Date of Birth: 1946-01-21  Transition of Care [facility]) CM/SW Contact:  Dessa Phi, RN Phone Number: 06/18/2020, 12:40 PM   Clinical Narrative: Spoke to spouse on phone about d/c plans-HHPT only-[facility]-they do not have HHRN;no [facility] agency able to accept. Spouse aware & can return demo wound care per Nsg-spouse is agreeable to do wound care & understands only HHPT. [facility] care for otpt palliative care-rep notified. Patient to also f/u @ wound care center.PTAR for transport home-confirmed address. PTAR called. No further CM needs.      Final next level of care: [place] Barriers to Discharge: No Barriers Identified   Patient Goals and CMS Choice Patient states their goals for this hospitalization and ongoing recovery are:: to go home CMS Medicare.gov Compare Post Acute Care list provided to:: Patient Choice offered to / list presented to : Patient  Discharge Placement                       Discharge Plan and Services   Discharge Planning Services: CM Consult Post Acute Care Choice: Home Health                    HH Arranged: PT (unable to get HHRN-spouse has returned demo wound care & is agreeable to [place] only.) [place]: [place] Date [place]: 06/18/20 Time [place]: 1121 Representative spoke with at [place]: [place] ([place]) Interventions     Readmission Risk Interventions No flowsheet data found.

## 2020-06-18 NOTE — Progress Notes (Signed)
PHYSICAL THERAPY HYDROTHERAPY  06/18/20  1055 - 1125  Subjective Assessment  Subjective doing  OK  Patient and Family Stated Goals to go home  Date of Onset  (present)  Prior Treatments dress change at home by caregiver  Evaluation and Treatment  Evaluation and Treatment Procedures Explained to Patient/Family Yes  Evaluation and Treatment Procedures agreed to  Hydrotherapy  Pulsed lavage therapy - wound location left of rectum  Pulsed Lavage with Suction (psi) 4 psi  Pulsed Lavage with Suction - Normal Saline Used 1000 mL  Pulsed Lavage Tip Tip with splash shield  Wound Therapy - Assess/Plan/Recommendations  Wound Therapy - Clinical Statement Patient tolerated well, Patient will benefit from a PT consult to assess mobility. Patient has multiple mets to pelvis, spine, ribs so unsure if there are any precautions.  Continue HYdro therapy.. If patient DC's home, recommend  low air loss mattress for his hospital bed. Has a WC.  Wound Therapy - Functional Problem List metastatic bladder cancer  with mets to bone:ribs, pelvis, spine., H/O seizure D/O. Paatient tolerates hydro treatment, noted small excoriated periwound bleeding. Patient does have some discomfort with debridement so minimized. Patient's caregiver present.  Factors Delaying/Impairing Wound Healing Diabetes Mellitus;Infection - systemic/local;Multiple medical problems;Incontinence;Altered sensation  Hydrotherapy Plan Dressing change;Patient/family education;Pulsatile lavage with suction  Wound Therapy - Frequency 6X / week  Wound Therapy - Follow Up Recommendations Skilled nursing facility  Wound Plan hydrotherapy  Wound Therapy Goals - Improve the function of patient's integumentary system by progressing the wound(s) through the phases of wound healing by:  Decrease Necrotic Tissue to 50  Increase Granulation Tissue to 50  Improve Drainage Characteristics Min  Additional Wound Therapy Goal reposition off left gluteal area   Goals/treatment plan/discharge plan were made with and agreed upon by patient/family Yes  Time For Goal Achievement 2 weeks   Spouse present during sessio.  Wound present with increased yellow slough and increased redness surrounding.  Difficult area to keep clear of BM's.  Positioned R sidelying after for pressure relief.   Rica Koyanagi  PTA Acute  Rehabilitation Services Pager      [phone] Office      [phone]

## 2020-06-18 NOTE — Progress Notes (Addendum)
PCT and I changed wound after pt had BM this AM. Ex-wife/caregiver at bedside watched. She asked questions and was involved with ensuring proper wound care was completed. Pt stable for discharge. However, Case Manager Juliann Pulse had discussion with caregiver via telephone regarding discharge and caregiver was left with a lot of questions. RN spoke with Juliann Pulse regarding concerns and Juliann Pulse spoke with caregiver again via telephone. During this time pt had a small BM so caregiver and RN performed dressing change. Caregiver was primary provider of care during stool cleaning and dressing change. She reports she feels confident in being able to change the dressing. Dressing change completed without complication. VSS. PTAR to bedside. PIV removed by PCT. Pt changed into paper gown. Telemetry dicontinued. Pt escorted off of unit via PTAR with caregiver.

## 2020-06-18 NOTE — TOC Progression Note (Addendum)
Transition of Care [facility]) - Progression Note    Patient Details  Name: [name] MRN: [id number] Date of Birth: May 22, 1946  Transition of Care [facility] CM/SW Contact  Briannie Gutierrez, Juliann Pulse, RN Phone Number: 06/18/2020, 1:29 PM  Clinical Narrative:Received call from nsg about spouse concerns about no HH nsg for dsg changes-explained to spouse on phone that there is no [place] agency able to provide [facility] nsg- only HHPT [facility]-only [facility] HHPT can be provided. Recommended to patient we could cancel d/c if not comfortable-spouse agreed to not cancelling d/c. Also spoke to nsg about return demo of wound care by spouse. All agreed to d/c. PTAR has already been called.No further CM needs.  2:12p-TC home tel# [phone] provided by spouse-spoke to son about Remote Health Referral-they will assess referral for 1-2 nursing visits to ensure spouse comfortable with wound care. No further CM needs.       Expected Discharge Plan: [place] Barriers to Discharge: No Barriers Identified  Expected Discharge Plan and Services Expected Discharge Plan: [place]   Discharge Planning Services: CM Consult Post Acute Care Choice: [place] arrangements for the past 2 months: Single Family Home Expected Discharge Date: 06/18/20                         HH Arranged: PT (unable to get HHRN-spouse has returned demo wound care & is agreeable to [place] only.) [place]: [place] Date [place]: 06/18/20 Time [place]: 74 Representative spoke with at [place]: [place] ([place]) Interventions    Readmission Risk Interventions No flowsheet data found.

## 2020-06-18 NOTE — Progress Notes (Signed)
Pharmacy Antibiotic Note  [name] is a 74 y.o. male recently diagnosed with metastatic prostate cancer presented to the ED on 06/11/2020 for evaluation of perirectal abscess. He was started on vancomycin, cefepime and flagyl on admission and subsequently narrowed to unasyn on 9/2.  Today, 06/18/2020: - day 7 total of abx, day 4 Unasyn - scr cont trending down with 1.34 (crcl~44) - afeb, wbc 25.6 >> 14  Plan: - Cont Unasyn dose 3gm IV q6h - monitor renal function closely - f/u with culture results _______________________________________  Height: 5\' 4"  (162.6 cm) Weight: 78.1 kg (172 lb 1.6 oz) IBW/kg (Calculated) : 59.2  Temp (24hrs), Avg:98.5 F (36.9 C), Min:98.3 F (36.8 C), Max:98.6 F (37 C)  Recent Labs  Lab 06/11/20 1346 06/11/20 1434 06/12/20 0042 06/12/20 0530 06/13/20 0906 06/14/20 0236 06/14/20 0930 06/15/20 0529 06/16/20 0554 06/17/20 0559 06/18/20 0613  WBC  --    < >  --    < >   < >  --  14.6* 17.0* 17.6* 15.5* 14.1*  CREATININE  --    < > 5.02*   < >  --  3.31*  --  1.95* 1.42* 1.25* 1.34*  LATICACIDVEN 1.3  --  0.9  --   --   --   --   --   --   --   --    < > = values in this interval not displayed.    Estimated Creatinine Clearance: 44.3 mL/min (A) (by C-G formula based on SCr of 1.34 mg/dL (H)).    Allergies  Allergen Reactions  . Altace [Ramipril] Cough   Antimicrobials this admission:  8/30 Zosyn x 1 8/30 Vanc >>9/2 8/31 Cefepime >>9/2 8/31 Flagyl >>9/1 9/2 unasyn>>  Microbiology results:  8/30 BCx2: ngtd 8/30 UCx cath: 40K yeast (chronic indwelling foley)F 8/30 COVID: neg 8/30 Perianus wound: few GPC in clusters, few GNR, normal skin flora FINAL 8/31 MRSA PCR neg 9/1 MRSA PCR: neg  Thank you for allowing pharmacy to be a part of this patient's care.  Minda Ditto PharmD 06/18/2020 10:38 AM

## 2020-06-18 NOTE — Discharge Summary (Signed)
Physician Discharge Summary  [name] MEQ:[id number] DOB: 1945-11-29 DOA: 06/11/2020  PCP: Luis Jordan, MD  Admit date: 06/11/2020 Discharge date: 06/18/2020  Admitted From: home Disposition:  home Discharging physician: Luis Dee, MD  Recommendations for Outpatient Follow-up:  1. Follow up with wound center 2. Follow up with urology  Home Health: RN/PT  Patient discharged to home in Discharge Condition: stable CODE STATUS: Full Diet recommendation:  Diet Orders (From admission, onward)    Start     Ordered   06/12/20 1034  Diet Carb Modified Fluid consistency: Thin; Room service appropriate? Yes  Diet effective now       Question Answer Comment  Diet-HS Snack? Nothing   Calorie Level Medium 1600-2000   Fluid consistency: Thin   Room service appropriate? Yes      06/12/20 [place] [facility]: [name] is a 74 yo CM with PMH DMII, CAD s/p CABG, PAD, HTN, HLD, CVA (residual left side weakness), newly diagnosed prostate cancer with bone mets who presented to the hospital on 06/11/20 with fevers and not feeling well for approximately 1 week. He was hypotensive and ultimately considered to be in septic shock from an underlying perirectal wound/abscess that was noted on CT abdomen/pelvis on admission.  There was no other obvious source of infection after ongoing work-up. He does also have a chronic indwelling Foley catheter. He was started on hydrotherapy for his gluteal wound.  Surgery was also consulted and assisted during hospitalization.  He was not felt to require surgical debridement and was recommended to continue hydrotherapy until discharge.  Santyl was ordered for enzymatic debridement with twice daily dressing changes. He ultimately stabilized and was able to come off of vasopressor support and his sepsis resolved.  Service was transferred to [facility] from [facility] on 06/14/2020 to continue care.  He continued to improve and was continued on hydrotherapy  during hospitalization.  His mobility status at baseline is bedbound.  He has good care at home from his ex-wife who plans to continue ongoing wound care and overall care at discharge.  She was educated regarding dressing changes by staff prior to discharge and was comfortable doing so.  He will complete 10 more days of Augmentin at discharge.  He will also follow-up with wound care center at discharge as well. He also plans to follow-up with urology for starting androgen deprivation therapy.   Severe sepsis with septic shock ([place]) -Initially admitted to the ICU and started on vasopressor support after fluid resuscitation.  Source considered gluteal wound given no other obvious source found -Has been treated with vancomycin, cefepime, Flagyl -Will further de-escalate down to Unasyn at this time, on 06/14/2020; Augmentin course to be completed for total of 14-day treatment at discharge  Pressure ulcer of buttock, unstageable ([place]) -Continue hydrotherapy until discharge per surgery -Continue Santyl with twice daily dressing changes -Per surgery, no surgical debridement at this time  Prostate cancer metastatic to bone [facility]) -Recent diagnosis in July 2021 -Appears to be following outpatient with urology with plans to start on androgen deprivation therapy -Also evaluated by oncology inpatient, appreciate assistance.  Plan will also be to continue on same course of treatment at discharge with no further oncology needs at this time  DKA (diabetic ketoacidoses) ([place]) -Present on admission and has since resolved -Continue SSI and CBG monitoring  Acute renal failure superimposed on stage 3a chronic kidney disease ([place]) - baseline creat approx 1.9 -Considered prerenal with some possible  ATN on admission -Creatinine slowly improving, continue to trend; appears back to baseline at this time  Hyponatremia - follow BMP daily  Normocytic anemia -Received 1 unit PRBC on 06/12/2020 for hemoglobin of 6  g/dL -Hemoglobin has remained stable and no further transfusions required -Daily CBC  History of stroke -Residual left-sided weakness with clenching of left fist  Seizure disorder (HCC) -Continue Keppra  Acute on chronic diastolic CHF (congestive heart failure) ([place]) -Echo performed on 06/13/2020.  EF 60 to 65% with grade 2 diastolic dysfunction -BNP elevated on 06/11/2020 (1002) -Has not received diuresis in setting of recent septic shock however is now starting to pick up urine output, likely in diuretic phase of ATN -Continue monitoring for any significant signs of volume overload  Type 2 diabetes mellitus without complication (HCC) - continue SSI and CBG monitoring     The patient's chronic medical conditions were treated accordingly per the patient's home medication regimen except as noted.  On day of discharge, patient was felt deemed stable for discharge. Patient/family member advised to call PCP or come back to ER if needed.   Discharge Diagnoses:   Principal Diagnosis: Severe sepsis with septic shock [facility])  Active Hospital Problems   Diagnosis Date Noted  . Severe sepsis with septic shock ([place]) 08/26/2019    Priority: High  . Pressure ulcer of buttock, unstageable ([place]) 06/12/2020    Priority: High  . Prostate cancer metastatic to bone ([place]) 06/14/2020    Priority: Medium  . Acute renal failure superimposed on stage 3a chronic kidney disease ([place]) 06/14/2020    Priority: Medium  . Type 2 diabetes mellitus without complication ([place]) 37/62/8315  . Acute on chronic diastolic CHF (congestive heart failure) ([place]) 06/14/2020  . Hyponatremia 08/26/2019  . Normocytic anemia 08/26/2019  . History of stroke 08/18/2018  . Essential hypertension 09/25/2015  . Seizure disorder [facility]) 03/08/2013    Resolved Hospital Problems   Diagnosis Date Noted Date Resolved  . DKA (diabetic ketoacidoses) ([place]) 06/14/2020 06/16/2020    Discharge Instructions    Discharge wound care:    Complete by: As directed    Continue twice daily dressing changes with Santyl   Increase activity slowly   Complete by: As directed      Allergies as of 06/18/2020      Reactions   Altace [ramipril] Cough      Medication List    TAKE these medications   amLODipine 10 MG tablet Commonly known as: NORVASC TAKE ONE TABLET BY MOUTH DAILY   amoxicillin-clavulanate 875-125 MG tablet Commonly known as: Augmentin Take 1 tablet by mouth 2 (two) times daily for 10 days.   carbidopa-levodopa 25-100 MG tablet Commonly known as: SINEMET IR Take 2 tablets by mouth 3 (three) times daily. Change: Take Two tabs 3 x daily   CoQ-10 50 MG Caps Take 1 capsule by mouth daily.   dantrolene 50 MG capsule Commonly known as: DANTRIUM Take 1 capsule (50 mg total) by mouth 2 (two) times daily.   dipyridamole-aspirin 200-25 MG 12hr capsule Commonly known as: AGGRENOX TAKE ONE CAPSULE BY MOUTH TWICE A DAY   ezetimibe 10 MG tablet Commonly known as: ZETIA TAKE ONE TABLET BY MOUTH DAILY   fenofibrate 48 MG tablet Commonly known as: TRICOR TAKE ONE TABLET BY MOUTH DAILY   Flaxseed (Linseed) 1000 MG Caps Take 1,000 mg by mouth every evening.   Folbic 2.5-25-2 MG Tabs tablet Generic drug: folic acid-pyridoxine-cyancobalamin Take 1 tablet by mouth once daily with food What changed:  See the new instructions.   furosemide 20 MG tablet Commonly known as: LASIX Take 20 mg by mouth daily. What changed: Another medication with the same name was removed. Continue taking this medication, and follow the directions you see here.   HumaLOG KwikPen 100 UNIT/ML KiwkPen Generic drug: insulin lispro Inject 4 Units into the skin 3 (three) times daily as needed (high blood sugar > 200).   hydrALAZINE 25 MG tablet Commonly known as: APRESOLINE TAKE ONE TABLET BY MOUTH THREE TIMES A DAY   Keppra 500 MG tablet Generic drug: levETIRAcetam TAKE ONE TABLET BY MOUTH TWICE A DAY   metoprolol tartrate 25 MG  tablet Commonly known as: LOPRESSOR TAKE ONE TABLET BY MOUTH DAILY   nitroGLYCERIN 0.4 MG SL tablet Commonly known as: NITROSTAT Place 1 tablet (0.4 mg total) under the tongue every 5 (five) minutes as needed for chest pain.   olmesartan 20 MG tablet Commonly known as: BENICAR TAKE ONE TABLET BY MOUTH DAILY   OneTouch Delica Lancets 02R Misc   OneTouch Verio test strip Generic drug: glucose blood   oxyCODONE 5 MG immediate release tablet Commonly known as: Roxicodone Take 1 tablet (5 mg total) by mouth every 6 (six) hours as needed for severe pain.   Rapaflo 8 MG Caps capsule Generic drug: silodosin Take 8 mg by mouth daily with breakfast.   rosuvastatin 20 MG tablet Commonly known as: CRESTOR TAKE ONE TABLET BY MOUTH DAILY   Toujeo SoloStar 300 UNIT/ML Solostar Pen Generic drug: insulin glargine (1 Unit Dial) Inject 16 Units into the skin at bedtime.   Vitamin D 50 MCG (2000 UT) Caps Take 1 capsule by mouth daily.            Discharge Care Instructions  (From admission, onward)         Start     Ordered   06/18/20 0000  Discharge wound care:       Comments: Continue twice daily dressing changes with Santyl   06/18/20 1200          Follow-up Information    [place]             . Call.   Why: Call and follow up for wound care Contact information: [address] [phone]             Allergies  Allergen Reactions  . Altace [Ramipril] Cough    Discharge Exam: BP (!) 165/72 (BP Location: Right Arm)   Pulse 100   Temp 98.2 F (36.8 C) (Oral)   Resp (!) 22   Ht 5\' 4"  (1.626 m)   Wt 78.1 kg   SpO2 (!) 88%   BMI 29.54 kg/m  General appearance: Chronically ill-appearing elderly man laying in bed comfortable and in no obvious distress Head: Normocephalic, without obvious abnormality Eyes: EOMI Lungs: clear to auscultation bilaterally Heart: regular rate  and rhythm and S1, S2 normal Abdomen: normal findings: bowel sounds normal and soft, non-tender Extremities: Contracted left upper extremity with left fist clenched Skin: mobility and turgor normal Neurologic: Contracted left upper extremity and difficult to assess strength but approximately 4/5.  Right upper extremity is also weak from what appears to be deconditioning but is 3+/5  The results of significant diagnostics from this hospitalization (including imaging, microbiology, ancillary and laboratory) are listed below for reference.   Microbiology: Recent Results (from the past 240 hour(s))  Culture, blood (Routine x 2)  Status: None   Collection Time: 06/11/20  2:34 PM   Specimen: BLOOD  Result Value Ref Range Status   Specimen Description   Final    BLOOD RIGHT ANTECUBITAL Performed at [place] [address] [place], [place] [address]    Special Requests   Final    BOTTLES DRAWN AEROBIC AND ANAEROBIC Blood Culture adequate volume Performed at [place] [address] [place], [place] [address]    Culture   Final    NO GROWTH 5 DAYS Performed at [facility], [place] [address], [place], [place] [address]    Report Status 06/16/2020 FINAL  Final  Culture, blood (Routine x 2)     Status: None   Collection Time: 06/11/20  2:35 PM   Specimen: BLOOD RIGHT FOREARM  Result Value Ref Range Status   Specimen Description   Final    BLOOD RIGHT FOREARM Performed at [place] [address] [place], [place] [address]    Special Requests   Final    BOTTLES DRAWN AEROBIC AND ANAEROBIC Blood Culture adequate volume Performed at [place] [address] [place], [place] [address]    Culture   Final    NO GROWTH 5 DAYS Performed at [facility], [address] [place] [address], [place], [place] [address]    Report Status 06/16/2020 FINAL  Final  Urine culture     Status: Abnormal   Collection  Time: 06/11/20  3:19 PM   Specimen: Urine, Catheterized  Result Value Ref Range Status   Specimen Description   Final    URINE, CATHETERIZED Performed at [place] [address], [place], [place] [address]    Special Requests   Final    NONE Performed at [facility], [place] [address], [place], [place] [address]    Culture (A)  Final    40,000 COLONIES/mL YEAST Performed at [facility], [place] [address], [place], [place] [address]    Report Status 06/13/2020 FINAL  Final  SARS Coronavirus 2 by RT PCR (hospital order, performed in [facility] hospital lab) Nasopharyngeal Nasopharyngeal Swab     Status: None   Collection Time: 06/11/20  3:42 PM   Specimen: Nasopharyngeal Swab  Result Value Ref Range Status   SARS Coronavirus 2 NEGATIVE NEGATIVE Final    Comment: (NOTE) SARS-CoV-2 target nucleic acids are NOT DETECTED.  The SARS-CoV-2 RNA is generally detectable in upper and lower respiratory specimens during the acute phase of infection. The lowest concentration of SARS-CoV-2 viral copies this assay can detect is 250 copies / mL. A negative result does not preclude SARS-CoV-2 infection and should not be used as the sole basis for treatment or other patient management decisions.  A negative result may occur with improper specimen collection / handling, submission of specimen other than nasopharyngeal swab, presence of viral mutation(s) within the areas targeted by this assay, and inadequate number of viral copies (<250 copies / mL). A negative result must be combined with clinical observations, patient history, and epidemiological information.  Fact Sheet for Patients:   StrictlyIdeas.no  Fact Sheet for Healthcare Providers: BankingDealers.co.za  This test is not yet approved or  cleared by the [place] FDA and has been authorized for detection and/or diagnosis of SARS-CoV-2  by FDA under an Emergency Use Authorization (EUA).  This EUA will remain in effect (meaning this test can be used) for the duration of the COVID-19  declaration under Section 564(b)(1) of the Act, 21 U.S.C. section 360bbb-3(b)(1), unless the authorization is terminated or revoked sooner.  Performed at [facility], [place] [address], [place], [place] [address]   Aerobic Culture (superficial specimen)     Status: None   Collection Time: 06/11/20  5:24 PM   Specimen: Wound  Result Value Ref Range Status   Specimen Description   Final    WOUND PERIANUS Performed at [place] [address] [place], [place] [address]    Special Requests   Final    NONE Performed at [facility], [place] [address], [place], [place] [address]    Gram Stain   Final    FEW WBC PRESENT, PREDOMINANTLY PMN FEW GRAM POSITIVE COCCI IN CLUSTERS FEW GRAM NEGATIVE RODS    Culture   Final    NORMAL SKIN FLORA NO STAPHYLOCOCCUS AUREUS ISOLATED NO GROUP A STREP (S.PYOGENES) ISOLATED Performed at [facility], [address], [place], [place] [address]    Report Status 06/14/2020 FINAL  Final  MRSA PCR Screening     Status: None   Collection Time: 06/13/20  9:19 AM   Specimen: Nasal Mucosa; Nasopharyngeal  Result Value Ref Range Status   MRSA by PCR NEGATIVE NEGATIVE Final    Comment:        The GeneXpert MRSA Assay (FDA approved for NASAL specimens only), is one component of a comprehensive MRSA colonization surveillance program. It is not intended to diagnose MRSA infection nor to guide or monitor treatment for MRSA infections. Performed at [facility], [place] [address], [place],  [address]      Labs: BNP (last 3 results) Recent Labs    08/26/19 1605 08/27/19 0257 06/11/20 1434  BNP 482.4* 381.5* 1,950.9*   Basic Metabolic Panel: Recent Labs  Lab 06/12/20 0530 06/14/20 0236 06/14/20 0930  06/15/20 0529 06/16/20 0554 06/17/20 0559 06/18/20 0613  NA  --  131*  --  138 139 139 133*  K  --  4.9  --  5.0 4.9 5.7* 5.4*  CL  --  105  --  111 110 107 102  CO2  --  16*  --  16* 19* 20* 18*  GLUCOSE  --  99  --  116* 118* 94 101*  BUN  --  90*  --  63* 41* 31* 29*  CREATININE  --  3.31*  --  1.95* 1.42* 1.25* 1.34*  CALCIUM  --  8.2*  --  8.5* 8.8* 8.8* 8.8*  MG   < >  --  1.7 1.6* 1.5* 1.3* 1.5*  PHOS   < >  --  5.4* 3.7 2.9 3.0 3.6   < > = values in this interval not displayed.   Liver Function Tests: Recent Labs  Lab 06/11/20 1434 06/11/20 1816  AST 18  --   ALT 7  --   ALKPHOS 91  --   BILITOT 0.9  --   PROT 6.2*  --   ALBUMIN 2.8* 2.4*   No results for input(s): LIPASE, AMYLASE in the last 168 hours. No results for input(s): AMMONIA in the last 168 hours. CBC: Recent Labs  Lab 06/14/20 0930 06/15/20 0529 06/16/20 0554 06/17/20 0559 06/18/20 0613  WBC 14.6* 17.0* 17.6* 15.5* 14.1*  NEUTROABS 11.8* 13.5* 14.3* 12.3* 11.2*  HGB 9.7* 10.3* 10.5* 10.4* 9.8*  HCT 29.2* 29.5* 30.9* 31.3* 30.0*  MCV 88.0 85.0 88.5 88.2 91.5  PLT 318 379  371 379 354   Cardiac Enzymes: No results for input(s): CKTOTAL, CKMB, CKMBINDEX, TROPONINI in the last 168 hours. BNP: Invalid input(s): POCBNP CBG: Recent Labs  Lab 06/17/20 1113 06/17/20 1642 06/17/20 2147 06/18/20 0734 06/18/20 1147  GLUCAP 127* 100* 116* 95 110*   D-Dimer No results for input(s): DDIMER in the last 72 hours. Hgb A1c No results for input(s): HGBA1C in the last 72 hours. Lipid Profile No results for input(s): CHOL, HDL, LDLCALC, TRIG, CHOLHDL, LDLDIRECT in the last 72 hours. Thyroid function studies No results for input(s): TSH, T4TOTAL, T3FREE, THYROIDAB in the last 72 hours.  Invalid input(s): FREET3 Anemia work up No results for input(s): VITAMINB12, FOLATE, FERRITIN, TIBC, IRON, RETICCTPCT in the last 72 hours. Urinalysis    Component Value Date/Time   COLORURINE YELLOW 06/11/2020  1519   APPEARANCEUR HAZY (A) 06/11/2020 1519   LABSPEC 1.014 06/11/2020 1519   PHURINE 5.0 06/11/2020 1519   GLUCOSEU 150 (A) 06/11/2020 1519   HGBUR NEGATIVE 06/11/2020 1519   BILIRUBINUR NEGATIVE 06/11/2020 1519   KETONESUR 5 (A) 06/11/2020 1519   PROTEINUR NEGATIVE 06/11/2020 1519   UROBILINOGEN 0.2 05/22/2011 1129   NITRITE NEGATIVE 06/11/2020 1519   LEUKOCYTESUR MODERATE (A) 06/11/2020 1519   Sepsis Labs Invalid input(s): PROCALCITONIN,  WBC,  LACTICIDVEN Microbiology Recent Results (from the past 240 hour(s))  Culture, blood (Routine x 2)     Status: None   Collection Time: 06/11/20  2:34 PM   Specimen: BLOOD  Result Value Ref Range Status   Specimen Description   Final    BLOOD RIGHT ANTECUBITAL Performed at [place] [address] [place], [place] [address]    Special Requests   Final    BOTTLES DRAWN AEROBIC AND ANAEROBIC Blood Culture adequate volume Performed at [facility] [address], [place], [place] [address]    Culture   Final    NO GROWTH 5 DAYS Performed at [facility], [place] [address], [place], [place] [address]    Report Status 06/16/2020 FINAL  Final  Culture, blood (Routine x 2)     Status: None   Collection Time: 06/11/20  2:35 PM   Specimen: BLOOD RIGHT FOREARM  Result Value Ref Range Status   Specimen Description   Final    BLOOD RIGHT FOREARM Performed at [place] [address], [place], [place] [address]    Special Requests   Final    BOTTLES DRAWN AEROBIC AND ANAEROBIC Blood Culture adequate volume Performed at [place] [place] [address], [place], [place] [address]    Culture   Final    NO GROWTH 5 DAYS Performed at [facility], [place] [address], [place], [place] [address]    Report Status 06/16/2020 FINAL  Final  Urine culture     Status: Abnormal   Collection Time: 06/11/20  3:19 PM   Specimen: Urine, Catheterized  Result Value  Ref Range Status   Specimen Description   Final    URINE, CATHETERIZED Performed at [facility] [address], [place], [place] [address]    Special Requests   Final    NONE Performed at [facility], [place] [address], [place], [place] [address]    Culture (A)  Final    40,000 COLONIES/mL YEAST Performed at [facility], [place] [address] [place], [place] [address]    Report Status 06/13/2020 FINAL  Final  SARS Coronavirus 2 by  RT PCR (hospital order, performed in [facility] hospital lab) Nasopharyngeal Nasopharyngeal Swab     Status: None   Collection Time: 06/11/20  3:42 PM   Specimen: Nasopharyngeal Swab  Result Value Ref Range Status   SARS Coronavirus 2 NEGATIVE NEGATIVE Final    Comment: (NOTE) SARS-CoV-2 target nucleic acids are NOT DETECTED.  The SARS-CoV-2 RNA is generally detectable in upper and lower respiratory specimens during the acute phase of infection. The lowest concentration of SARS-CoV-2 viral copies this assay can detect is 250 copies / mL. A negative result does not preclude SARS-CoV-2 infection and should not be used as the sole basis for treatment or other patient management decisions.  A negative result may occur with improper specimen collection / handling, submission of specimen other than nasopharyngeal swab, presence of viral mutation(s) within the areas targeted by this assay, and inadequate number of viral copies (<250 copies / mL). A negative result must be combined with clinical observations, patient history, and epidemiological information.  Fact Sheet for Patients:   StrictlyIdeas.no  Fact Sheet for Healthcare Providers: BankingDealers.co.za  This test is not yet approved or  cleared by the [place] FDA and has been authorized for detection and/or diagnosis of SARS-CoV-2 by FDA under an Emergency Use Authorization (EUA).  This EUA will remain in  effect (meaning this test can be used) for the duration of the COVID-19 declaration under Section 564(b)(1) of the Act, 21 U.S.C. section 360bbb-3(b)(1), unless the authorization is terminated or revoked sooner.  Performed at [facility], [place] [address] [place], [place] [address]   Aerobic Culture (superficial specimen)     Status: None   Collection Time: 06/11/20  5:24 PM   Specimen: Wound  Result Value Ref Range Status   Specimen Description   Final    WOUND PERIANUS Performed at [place] [address], [place], [place] [address]    Special Requests   Final    NONE Performed at [facility], [place] [address] [place], [place] [address]    Gram Stain   Final    FEW WBC PRESENT, PREDOMINANTLY PMN FEW GRAM POSITIVE COCCI IN CLUSTERS FEW GRAM NEGATIVE RODS    Culture   Final    NORMAL SKIN FLORA NO STAPHYLOCOCCUS AUREUS ISOLATED NO GROUP A STREP (S.PYOGENES) ISOLATED Performed at [facility], [address], [place], [place] [address]    Report Status 06/14/2020 FINAL  Final  MRSA PCR Screening     Status: None   Collection Time: 06/13/20  9:19 AM   Specimen: Nasal Mucosa; Nasopharyngeal  Result Value Ref Range Status   MRSA by PCR NEGATIVE NEGATIVE Final    Comment:        The GeneXpert MRSA Assay (FDA approved for NASAL specimens only), is one component of a comprehensive MRSA colonization surveillance program. It is not intended to diagnose MRSA infection nor to guide or monitor treatment for MRSA infections. Performed at [facility], [place] [address], [place], [place] [address]     Procedures/Studies: CT ABDOMEN PELVIS WO CONTRAST  Addendum Date: 06/11/2020   ADDENDUM REPORT: 06/11/2020 21:43 ADDENDUM: Few borderline enlarged retroperitoneal lymph nodes, stable to decreased in size from comparison imaging. Electronically Signed   By: Lovena Le M.D.   On: 06/11/2020 21:43    Result Date: 06/11/2020 CLINICAL DATA:  Acute renal failure, metastatic prostate cancer and perianal abscess EXAM: CT ABDOMEN AND PELVIS WITHOUT CONTRAST TECHNIQUE: Multidetector CT imaging  of the abdomen and pelvis was performed following the standard protocol without IV contrast. COMPARISON:  Wide for abscess cysts cough price FINDINGS: Lower chest: Small bilateral effusions. Adjacent areas of passive atelectasis. Underlying consolidation not excluded. Normal cardiac size. Pacer leads appear directed towards the coronary sinus. Coronary artery atherosclerosis is noted as well. Evidence of prior sternotomy and CABG. Hypoattenuation of the cardiac blood pool suggestive of anemia. Hepatobiliary: Borderline hepatomegaly similar to comparison. Smooth liver surface contour. No visible focal liver lesion on this unenhanced CT. Post cholecystectomy. No significant biliary ductal dilatation or visible intraductal gallstones. Pancreas: Stable atrophy of the pancreatic body and tail. No pancreatic ductal dilatation or surrounding inflammatory changes. Spleen: Normal in size. No concerning splenic lesions. Small accessory splenule. Adrenals/Urinary Tract: Lobular thickening of the adrenal glands, similar to comparison studies, without discernible focal adrenal lesion. Moderate bilateral perinephric stranding, overall similar to comparison CT from August 2021. Bilateral renal hilar calcifications are likely vascular in nature without discernible urolithiasis or hydronephrosis. Bladder decompressed by inflated Foley catheter balloon albeit with some mild wall thickening and perivesicular hazy stranding, nonspecific in the setting of instrumentation. Stomach/Bowel: Distal esophagus, stomach and duodenal sweep are unremarkable. No small bowel wall thickening or dilatation. No evidence of obstruction. Appendix is not visualized. Normal caliber appendix is present though does course in the vicinity of adjacent free fluid in the  right pericolic gutter. Question some mild mural thickening of the colon versus underdistention. Perianal gas and possible fistulous connection, as detailed below. Vascular/Lymphatic: Atherosclerotic calcifications within the abdominal aorta and branch vessels. No aneurysm or ectasia. Stable appearance of a borderline enlarged right retrocaval lymph node measuring 1.1 cm (2/35) diminished conspicuity of a right external iliac lymph node now measuring only 7 mm in size, previously 0.9 cm (2/69) stable appearance of a separate borderline enlarged right external iliac node measuring up to 1.1 cm short axis (2/80). No other concerning adenopathy. Reproductive: Mild prostatomegaly, traversed by the inflated Foley catheter balloon. Lobular thickening of the seminal vesicles is a nonspecific finding. No acute abnormality of the external genitalia. Soft tissue gas and stranding extends to the base of the left penile corpora and spongiosum. Other: Soft tissue thickening and perianal abscess with soft tissue defect of the left ischioanal fossa (2/97. A tract of gas lucencies extends from the soft tissue thickening seen just laterally to the base of the left penile corpora cavernosum and spongiosum anteriorly and to the margin of the thickened levator plate with a small amount of intersphincteric low-attenuation fluid and gas seen on coronal imaging (4/90). Small volume of low-attenuation free fluid seen layering in the pericolic gutters and pelvis, nonspecific. Moderate body wall edema. Musculoskeletal: Multilevel sclerotic lesions present throughout the included thoracolumbar spine and bony pelvis. Overall extent and distribution similar to comparison. Multilevel degenerative changes in the spine hips and pelvis as well as the left wrist, partially included within the level of imaging. IMPRESSION: 1. Soft tissue thickening and perianal ulcer with soft tissue defect of the left ischioanal fossa. 2. A tract of gas lucency is  seen extending from the region of skin thickening and defect lateral to the ulcer base anteromedially to the left penile corpora cavernosum and spongiosum anteriorly and to the margin of the thickened left levator plate with a small amount of intersphincteric low-attenuation fluid and gas, concerning fistulous connection. A discrete abscess/collection is not discernible at this time. 3. Question some mild mural thickening of the colon versus underdistention of the cecum. Correlate with clinical symptoms  and consider direct visualization. 4. Small volume of low-attenuation free fluid seen layering in the pericolic gutters and pelvis, nonspecific. 5. Small bilateral effusions with adjacent areas of passive atelectasis. Underlying consolidation not excluded. 6. Moderate body wall edema. Correlate for features of anasarca/volume third-spacing. 7. Multilevel sclerotic lesions throughout the included thoracolumbar spine and bony pelvis, consistent with metastatic disease. 8. Mild prostatomegaly and lobular thickening of the seminal vesicles. May correlate with history of patient's prostate cancer. 9. Moderate bilateral perinephric stranding, overall similar to comparison CT from August 2021. Findings could reflect senescent change, however, if urinary symptoms are present recommend correlation with urinalysis to exclude infection. 10. Aortic Atherosclerosis (ICD10-I70.0). Electronically Signed: By: Lovena Le M.D. On: 06/11/2020 20:55   NM Bone Scan Whole Body  Result Date: 06/06/2020 CLINICAL DATA:  Prostate cancer, fell on 06/06/2020 EXAM: NUCLEAR MEDICINE WHOLE BODY BONE SCAN TECHNIQUE: Whole body anterior and posterior images were obtained approximately 3 hours after intravenous injection of radiopharmaceutical. RADIOPHARMACEUTICALS:  21.6 mCi Technetium-66m MDP IV COMPARISON:  None Correlation: CT chest abdomen pelvis 06/06/2020 FINDINGS: Multiple sites of abnormal osseous tracer uptake are seen consistent with  osseous metastases. These include BILATERAL ribs, thoracic spine, lumbar spine, and pelvis. Degenerative type uptake of tracer at the shoulders, elbows, and knees. Expected urinary tract and soft tissue distribution of tracer. IMPRESSION: Osseous metastatic disease as above. Electronically Signed   By: Lavonia Dana M.D.   On: 06/06/2020 18:02   DG Chest Port 1 View  Result Date: 06/11/2020 CLINICAL DATA:  Central line placement EXAM: PORTABLE CHEST 1 VIEW COMPARISON:  Radiograph 06/11/2020, CT 06/06/2020 FINDINGS: Right IJ catheter tip terminates at the level of the right atrium. Prior sternotomy and features of CABG. Stable cardiomediastinal contours. Atelectatic changes and vascular crowding are similar to comparison. Some abundant mediastinal and subpleural fat is similar to comparison CT. No convincing pneumothorax or effusion. No new focal consolidative opacity or features of edema. No acute osseous or soft tissue abnormality. Telemetry leads overlie the chest. Surgical clips at the base of the neck and evidence of left carotid stenting. IMPRESSION: 1. Right IJ catheter tip terminates at the level of the right atrium. 2. No convincing pneumothorax. 3. Stable bilateral atelectasis and vascular crowding. Electronically Signed   By: Lovena Le M.D.   On: 06/11/2020 20:05   DG Chest Port 1 View  Result Date: 06/11/2020 CLINICAL DATA:  Dyspnea EXAM: PORTABLE CHEST 1 VIEW COMPARISON:  [id number] FINDINGS: Minimal left basilar atelectasis. Lungs are otherwise clear. No pneumothorax or pleural effusion. Coronary artery bypass grafting has been performed. Cardiac size is within normal limits. Pulmonary vascularity is normal. No acute bone abnormality. IMPRESSION: No active disease. Electronically Signed   By: Fidela Salisbury MD   On: 06/11/2020 16:37   ECHOCARDIOGRAM COMPLETE  Result Date: 06/13/2020    ECHOCARDIOGRAM REPORT   Patient Name:   LEJEND DALBY Date of Exam: 06/13/2020 Medical Rec #:  [id number]        Height:       64.0 in Accession #:    [id number]      Weight:       175.0 lb Date of Birth:  04/20/1946       BSA:          1.849 m Patient Age:    56 years        BP:           95/44 mmHg Patient Gender: M  HR:           72 bpm. Exam Location:  Inpatient Procedure: 2D Echo, 3D Echo and Strain Analysis Indications:    Cardiomyopathy-Unspecified 425.9 / I42.9  History:        Patient has prior history of Echocardiogram examinations. CHF,                 CAD and Previous Myocardial Infarction, Prior CABG, PAD,                 Signs/Symptoms:Hypotension; Risk Factors:Hypertension and                 Dyslipidemia. Septic shock, Acute kidney injury, Anemia,                 Abscess.  Sonographer:    Darlina Sicilian RDCS Referring Phys: [id number] [place]  1. Left ventricular ejection fraction, by estimation, is 60 to 65%. The left ventricle has normal function. The left ventricle has no regional wall motion abnormalities. Left ventricular diastolic parameters are consistent with Grade II diastolic dysfunction (pseudonormalization). Elevated left atrial pressure.  2. Right ventricular systolic function is normal. The right ventricular size is normal. There is mildly elevated pulmonary artery systolic pressure. The estimated right ventricular systolic pressure is 54.5 mmHg.  3. Left atrial size was moderately dilated.  4. The mitral valve is normal in structure. Mild mitral valve regurgitation. No evidence of mitral stenosis.  5. Tricuspid valve regurgitation is mild to moderate.  6. The aortic valve is normal in structure. Aortic valve regurgitation is not visualized. Mild aortic valve sclerosis is present, with no evidence of aortic valve stenosis.  7. The inferior vena cava is normal in size with greater than 50% respiratory variability, suggesting right atrial pressure of 3 mmHg. FINDINGS  Left Ventricle: Left ventricular ejection fraction, by estimation, is 60 to 65%. The left ventricle  has normal function. The left ventricle has no regional wall motion abnormalities. The left ventricular internal cavity size was normal in size. There is  no left ventricular hypertrophy. Abnormal (paradoxical) septal motion consistent with post-operative status. Left ventricular diastolic parameters are consistent with Grade II diastolic dysfunction (pseudonormalization). Elevated left atrial pressure. Right Ventricle: The right ventricular size is normal. No increase in right ventricular wall thickness. Right ventricular systolic function is normal. There is mildly elevated pulmonary artery systolic pressure. The tricuspid regurgitant velocity is 3.11  m/s, and with an assumed right atrial pressure of 3 mmHg, the estimated right ventricular systolic pressure is 62.5 mmHg. Left Atrium: Left atrial size was moderately dilated. Right Atrium: Right atrial size was normal in size. Pericardium: There is no evidence of pericardial effusion. Mitral Valve: The mitral valve is normal in structure. Normal mobility of the mitral valve leaflets. Mild mitral valve regurgitation. No evidence of mitral valve stenosis. Tricuspid Valve: The tricuspid valve is normal in structure. Tricuspid valve regurgitation is mild to moderate. No evidence of tricuspid stenosis. Aortic Valve: The aortic valve is normal in structure. Aortic valve regurgitation is not visualized. Mild aortic valve sclerosis is present, with no evidence of aortic valve stenosis. Pulmonic Valve: The pulmonic valve was normal in structure. Pulmonic valve regurgitation is not visualized. No evidence of pulmonic stenosis. Aorta: The aortic root is normal in size and structure. Venous: The inferior vena cava is normal in size with greater than 50% respiratory variability, suggesting right atrial pressure of 3 mmHg. IAS/Shunts: No atrial level shunt detected by color flow Doppler.  LEFT  VENTRICLE PLAX 2D LVIDd:         4.60 cm  Diastology LVIDs:         2.80 cm  LV e'  lateral:   9.36 cm/s LV PW:         0.90 cm  LV E/e' lateral: 12.7 LV IVS:        1.10 cm  LV e' medial:    6.09 cm/s LVOT diam:     2.10 cm  LV E/e' medial:  19.5 LV SV:         74 LV SV Index:   40 LVOT Area:     3.46 cm                          3D Volume EF:                         3D EF:        66 %                         LV EDV:       129 ml                         LV ESV:       43 ml                         LV SV:        85 ml RIGHT VENTRICLE RV S prime:     11.90 cm/s TAPSE (M-mode): 1.5 cm LEFT ATRIUM             Index       RIGHT ATRIUM           Index LA diam:        4.80 cm 2.60 cm/m  RA Area:     10.20 cm LA Vol (A2C):   55.3 ml 29.91 ml/m RA Volume:   15.60 ml  8.44 ml/m LA Vol (A4C):   48.1 ml 26.02 ml/m LA Biplane Vol: 52.2 ml 28.24 ml/m  AORTIC VALVE LVOT Vmax:   95.10 cm/s LVOT Vmean:  64.800 cm/s LVOT VTI:    0.213 m  AORTA Ao Root diam: 3.10 cm MITRAL VALVE                TRICUSPID VALVE MV Area (PHT): 4.49 cm     TR Peak grad:   38.7 mmHg MV Decel Time: 169 msec     TR Vmax:        311.00 cm/s MV E velocity: 119.00 cm/s MV A velocity: 63.00 cm/s   SHUNTS MV E/A ratio:  1.89         Systemic VTI:  0.21 m                             Systemic Diam: 2.10 cm Sanda Klein MD Electronically signed by Sanda Klein MD Signature Date/Time: 06/13/2020/12:56:22 PM    Final      Time coordinating discharge: Over 30 minutes    Luis Dee, MD  [facility] 06/18/2020, 12:05 PM Pager: Secure chat  If 7PM-7AM, please contact night-coverage [email or web address] Password [email or web address]

## 2020-06-19 ENCOUNTER — Other Ambulatory Visit: Payer: Self-pay

## 2020-06-19 ENCOUNTER — Encounter (HOSPITAL_COMMUNITY): Payer: Self-pay | Admitting: Obstetrics and Gynecology

## 2020-06-19 ENCOUNTER — Telehealth: Payer: Self-pay

## 2020-06-19 ENCOUNTER — Ambulatory Visit: Payer: Medicare Other | Attending: Radiation Oncology | Admitting: Radiation Oncology

## 2020-06-19 ENCOUNTER — Emergency Department (HOSPITAL_COMMUNITY): Payer: Medicare Other

## 2020-06-19 ENCOUNTER — Ambulatory Visit: Payer: Medicare Other

## 2020-06-19 ENCOUNTER — Inpatient Hospital Stay (HOSPITAL_COMMUNITY)
Admission: EM | Admit: 2020-06-19 | Discharge: 2020-06-27 | DRG: 871 | Disposition: A | Discharge status: Home Health | Payer: Medicare Other | Attending: Family Medicine | Admitting: Family Medicine

## 2020-06-19 DIAGNOSIS — I248 Other forms of acute ischemic heart disease: Secondary | ICD-10-CM | POA: Diagnosis present

## 2020-06-19 DIAGNOSIS — N1831 Chronic kidney disease, stage 3a: Secondary | ICD-10-CM | POA: Diagnosis present

## 2020-06-19 DIAGNOSIS — J9601 Acute respiratory failure with hypoxia: Secondary | ICD-10-CM | POA: Diagnosis present

## 2020-06-19 DIAGNOSIS — I2510 Atherosclerotic heart disease of native coronary artery without angina pectoris: Secondary | ICD-10-CM | POA: Diagnosis present

## 2020-06-19 DIAGNOSIS — I69354 Hemiplegia and hemiparesis following cerebral infarction affecting left non-dominant side: Secondary | ICD-10-CM | POA: Diagnosis not present

## 2020-06-19 DIAGNOSIS — Z8673 Personal history of transient ischemic attack (TIA), and cerebral infarction without residual deficits: Secondary | ICD-10-CM

## 2020-06-19 DIAGNOSIS — B3749 Other urogenital candidiasis: Secondary | ICD-10-CM | POA: Diagnosis present

## 2020-06-19 DIAGNOSIS — E875 Hyperkalemia: Secondary | ICD-10-CM | POA: Diagnosis present

## 2020-06-19 DIAGNOSIS — Z9049 Acquired absence of other specified parts of digestive tract: Secondary | ICD-10-CM

## 2020-06-19 DIAGNOSIS — G20 Parkinson's disease: Secondary | ICD-10-CM | POA: Diagnosis not present

## 2020-06-19 DIAGNOSIS — Z85828 Personal history of other malignant neoplasm of skin: Secondary | ICD-10-CM | POA: Diagnosis not present

## 2020-06-19 DIAGNOSIS — Z87891 Personal history of nicotine dependence: Secondary | ICD-10-CM | POA: Diagnosis not present

## 2020-06-19 DIAGNOSIS — J690 Pneumonitis due to inhalation of food and vomit: Secondary | ICD-10-CM | POA: Diagnosis present

## 2020-06-19 DIAGNOSIS — C61 Malignant neoplasm of prostate: Secondary | ICD-10-CM | POA: Diagnosis present

## 2020-06-19 DIAGNOSIS — Z794 Long term (current) use of insulin: Secondary | ICD-10-CM

## 2020-06-19 DIAGNOSIS — I5033 Acute on chronic diastolic (congestive) heart failure: Secondary | ICD-10-CM | POA: Diagnosis present

## 2020-06-19 DIAGNOSIS — N179 Acute kidney failure, unspecified: Secondary | ICD-10-CM | POA: Diagnosis present

## 2020-06-19 DIAGNOSIS — T17908A Unspecified foreign body in respiratory tract, part unspecified causing other injury, initial encounter: Secondary | ICD-10-CM

## 2020-06-19 DIAGNOSIS — J9690 Respiratory failure, unspecified, unspecified whether with hypoxia or hypercapnia: Secondary | ICD-10-CM

## 2020-06-19 DIAGNOSIS — G20A1 Parkinson's disease without dyskinesia, without mention of fluctuations: Secondary | ICD-10-CM | POA: Diagnosis present

## 2020-06-19 DIAGNOSIS — E119 Type 2 diabetes mellitus without complications: Secondary | ICD-10-CM

## 2020-06-19 DIAGNOSIS — E1122 Type 2 diabetes mellitus with diabetic chronic kidney disease: Secondary | ICD-10-CM | POA: Diagnosis present

## 2020-06-19 DIAGNOSIS — N400 Enlarged prostate without lower urinary tract symptoms: Secondary | ICD-10-CM | POA: Diagnosis present

## 2020-06-19 DIAGNOSIS — Z951 Presence of aortocoronary bypass graft: Secondary | ICD-10-CM | POA: Diagnosis not present

## 2020-06-19 DIAGNOSIS — K567 Ileus, unspecified: Secondary | ICD-10-CM | POA: Diagnosis not present

## 2020-06-19 DIAGNOSIS — R1310 Dysphagia, unspecified: Secondary | ICD-10-CM | POA: Diagnosis present

## 2020-06-19 DIAGNOSIS — Z20822 Contact with and (suspected) exposure to covid-19: Secondary | ICD-10-CM | POA: Diagnosis present

## 2020-06-19 DIAGNOSIS — L0231 Cutaneous abscess of buttock: Secondary | ICD-10-CM

## 2020-06-19 DIAGNOSIS — C7951 Secondary malignant neoplasm of bone: Secondary | ICD-10-CM | POA: Diagnosis present

## 2020-06-19 DIAGNOSIS — E871 Hypo-osmolality and hyponatremia: Secondary | ICD-10-CM | POA: Diagnosis present

## 2020-06-19 DIAGNOSIS — I130 Hypertensive heart and chronic kidney disease with heart failure and stage 1 through stage 4 chronic kidney disease, or unspecified chronic kidney disease: Secondary | ICD-10-CM | POA: Diagnosis present

## 2020-06-19 DIAGNOSIS — R5381 Other malaise: Secondary | ICD-10-CM | POA: Diagnosis present

## 2020-06-19 DIAGNOSIS — J189 Pneumonia, unspecified organism: Secondary | ICD-10-CM | POA: Diagnosis present

## 2020-06-19 DIAGNOSIS — Z8551 Personal history of malignant neoplasm of bladder: Secondary | ICD-10-CM

## 2020-06-19 DIAGNOSIS — E872 Acidosis: Secondary | ICD-10-CM | POA: Diagnosis present

## 2020-06-19 DIAGNOSIS — K56609 Unspecified intestinal obstruction, unspecified as to partial versus complete obstruction: Secondary | ICD-10-CM

## 2020-06-19 DIAGNOSIS — E785 Hyperlipidemia, unspecified: Secondary | ICD-10-CM | POA: Diagnosis present

## 2020-06-19 DIAGNOSIS — Z955 Presence of coronary angioplasty implant and graft: Secondary | ICD-10-CM

## 2020-06-19 DIAGNOSIS — Z79899 Other long term (current) drug therapy: Secondary | ICD-10-CM

## 2020-06-19 DIAGNOSIS — A419 Sepsis, unspecified organism: Secondary | ICD-10-CM | POA: Diagnosis not present

## 2020-06-19 DIAGNOSIS — G40909 Epilepsy, unspecified, not intractable, without status epilepticus: Secondary | ICD-10-CM

## 2020-06-19 DIAGNOSIS — J188 Other pneumonia, unspecified organism: Secondary | ICD-10-CM | POA: Diagnosis present

## 2020-06-19 DIAGNOSIS — E1151 Type 2 diabetes mellitus with diabetic peripheral angiopathy without gangrene: Secondary | ICD-10-CM | POA: Diagnosis present

## 2020-06-19 DIAGNOSIS — K529 Noninfective gastroenteritis and colitis, unspecified: Secondary | ICD-10-CM | POA: Diagnosis present

## 2020-06-19 DIAGNOSIS — K869 Disease of pancreas, unspecified: Secondary | ICD-10-CM | POA: Diagnosis present

## 2020-06-19 DIAGNOSIS — I10 Essential (primary) hypertension: Secondary | ICD-10-CM | POA: Diagnosis present

## 2020-06-19 DIAGNOSIS — I252 Old myocardial infarction: Secondary | ICD-10-CM

## 2020-06-19 DIAGNOSIS — D630 Anemia in neoplastic disease: Secondary | ICD-10-CM | POA: Diagnosis present

## 2020-06-19 HISTORY — DX: Pneumonia, unspecified organism: J18.9

## 2020-06-19 LAB — URINALYSIS, ROUTINE W REFLEX MICROSCOPIC
Bilirubin Urine: NEGATIVE
Glucose, UA: NEGATIVE mg/dL
Ketones, ur: 5 mg/dL — AB
Nitrite: NEGATIVE
Protein, ur: 30 mg/dL — AB
RBC / HPF: >50 RBC/hpf — ABNORMAL HIGH (ref 0–5)
Specific Gravity, Urine: 1.01 (ref 1.005–1.030)
WBC, UA: >50 WBC/hpf — ABNORMAL HIGH (ref 0–5)
pH: 5 (ref 5.0–8.0)

## 2020-06-19 LAB — CBC WITH DIFFERENTIAL/PLATELET
Abs Immature Granulocytes: 0.13 10*3/uL — ABNORMAL HIGH (ref 0.00–0.07)
Basophils Absolute: 0 10*3/uL (ref 0.0–0.1)
Basophils Relative: 0 %
Eosinophils Absolute: 0.1 10*3/uL (ref 0.0–0.5)
Eosinophils Relative: 1 %
HCT: 33.6 % — ABNORMAL LOW (ref 39.0–52.0)
Hemoglobin: 10.6 g/dL — ABNORMAL LOW (ref 13.0–17.0)
Immature Granulocytes: 1 %
Lymphocytes Relative: 7 %
Lymphs Abs: 0.9 10*3/uL (ref 0.7–4.0)
MCH: 29.5 pg (ref 26.0–34.0)
MCHC: 31.5 g/dL (ref 30.0–36.0)
MCV: 93.6 fL (ref 80.0–100.0)
Monocytes Absolute: 1.1 10*3/uL — ABNORMAL HIGH (ref 0.1–1.0)
Monocytes Relative: 9 %
Neutro Abs: 10.5 10*3/uL — ABNORMAL HIGH (ref 1.7–7.7)
Neutrophils Relative %: 82 %
Platelets: 364 10*3/uL (ref 150–400)
RBC: 3.59 MIL/uL — ABNORMAL LOW (ref 4.22–5.81)
RDW: 21.5 % — ABNORMAL HIGH (ref 11.5–15.5)
WBC: 12.8 10*3/uL — ABNORMAL HIGH (ref 4.0–10.5)
nRBC: 0 % (ref 0.0–0.2)

## 2020-06-19 LAB — COMPREHENSIVE METABOLIC PANEL
ALT: 8 U/L (ref 0–44)
AST: 20 U/L (ref 15–41)
Albumin: 2.8 g/dL — ABNORMAL LOW (ref 3.5–5.0)
Alkaline Phosphatase: 76 U/L (ref 38–126)
Anion gap: 11 (ref 5–15)
BUN: 29 mg/dL — ABNORMAL HIGH (ref 8–23)
CO2: 18 mmol/L — ABNORMAL LOW (ref 22–32)
Calcium: 8.9 mg/dL (ref 8.9–10.3)
Chloride: 103 mmol/L (ref 98–111)
Creatinine, Ser: 1.3 mg/dL — ABNORMAL HIGH (ref 0.61–1.24)
GFR calc Af Amer: >60 mL/min (ref >60)
GFR calc non Af Amer: 53 mL/min — ABNORMAL LOW (ref >60)
Glucose, Bld: 104 mg/dL — ABNORMAL HIGH (ref 70–99)
Potassium: 5.5 mmol/L — ABNORMAL HIGH (ref 3.5–5.1)
Sodium: 132 mmol/L — ABNORMAL LOW (ref 135–145)
Total Bilirubin: 0.7 mg/dL (ref 0.3–1.2)
Total Protein: 6.6 g/dL (ref 6.5–8.1)

## 2020-06-19 LAB — TROPONIN I (HIGH SENSITIVITY)
Troponin I (High Sensitivity): 90 ng/L — ABNORMAL HIGH (ref <18)
Troponin I (High Sensitivity): 93 ng/L — ABNORMAL HIGH (ref <18)

## 2020-06-19 LAB — BRAIN NATRIURETIC PEPTIDE: B Natriuretic Peptide: 2077.1 pg/mL — ABNORMAL HIGH (ref 0.0–100.0)

## 2020-06-19 LAB — LACTIC ACID, PLASMA: Lactic Acid, Venous: 0.7 mmol/L (ref 0.5–1.9)

## 2020-06-19 LAB — SARS CORONAVIRUS 2 BY RT PCR (HOSPITAL ORDER, PERFORMED IN ~~LOC~~ HOSPITAL LAB): SARS Coronavirus 2: NEGATIVE

## 2020-06-19 LAB — CBG MONITORING, ED
Glucose-Capillary: 119 mg/dL — ABNORMAL HIGH (ref 70–99)
Glucose-Capillary: 133 mg/dL — ABNORMAL HIGH (ref 70–99)

## 2020-06-19 MED ORDER — INSULIN ASPART 100 UNIT/ML ~~LOC~~ SOLN
0.0000 [IU] | Freq: Three times a day (TID) | SUBCUTANEOUS | Status: DC
  Administered 2020-06-20: 2 [IU] via SUBCUTANEOUS
  Administered 2020-06-21 – 2020-06-22 (×3): 1 [IU] via SUBCUTANEOUS
  Administered 2020-06-23: 2 [IU] via SUBCUTANEOUS
  Administered 2020-06-24 – 2020-06-26 (×3): 1 [IU] via SUBCUTANEOUS
  Filled 2020-06-19: qty 0.09

## 2020-06-19 MED ORDER — ROSUVASTATIN CALCIUM 20 MG PO TABS
20.0000 mg | ORAL_TABLET | Freq: Every day | ORAL | Status: DC
  Administered 2020-06-19 – 2020-06-23 (×5): 20 mg via ORAL
  Filled 2020-06-19 (×5): qty 1

## 2020-06-19 MED ORDER — DANTROLENE SODIUM 25 MG PO CAPS
50.0000 mg | ORAL_CAPSULE | Freq: Two times a day (BID) | ORAL | Status: DC
  Administered 2020-06-19 – 2020-06-27 (×15): 50 mg via ORAL
  Filled 2020-06-19 (×17): qty 2

## 2020-06-19 MED ORDER — TAMSULOSIN HCL 0.4 MG PO CAPS
0.4000 mg | ORAL_CAPSULE | Freq: Every day | ORAL | Status: DC
  Administered 2020-06-20 – 2020-06-27 (×6): 0.4 mg via ORAL
  Filled 2020-06-19 (×6): qty 1

## 2020-06-19 MED ORDER — VANCOMYCIN HCL IN DEXTROSE 1-5 GM/200ML-% IV SOLN
1000.0000 mg | Freq: Once | INTRAVENOUS | Status: AC
  Administered 2020-06-19: 1000 mg via INTRAVENOUS
  Filled 2020-06-19: qty 200

## 2020-06-19 MED ORDER — SODIUM CHLORIDE 0.9 % IV SOLN
2.0000 g | Freq: Two times a day (BID) | INTRAVENOUS | Status: DC
  Administered 2020-06-20 – 2020-06-21 (×5): 2 g via INTRAVENOUS
  Filled 2020-06-19 (×5): qty 2

## 2020-06-19 MED ORDER — INSULIN GLARGINE 100 UNIT/ML ~~LOC~~ SOLN
10.0000 [IU] | Freq: Every day | SUBCUTANEOUS | Status: DC
  Administered 2020-06-20 – 2020-06-26 (×8): 10 [IU] via SUBCUTANEOUS
  Filled 2020-06-19 (×9): qty 0.1

## 2020-06-19 MED ORDER — LEVETIRACETAM 500 MG PO TABS
500.0000 mg | ORAL_TABLET | Freq: Two times a day (BID) | ORAL | Status: DC
  Administered 2020-06-19 – 2020-06-27 (×15): 500 mg via ORAL
  Filled 2020-06-19 (×16): qty 1

## 2020-06-19 MED ORDER — AMLODIPINE BESYLATE 10 MG PO TABS
10.0000 mg | ORAL_TABLET | Freq: Every day | ORAL | Status: DC
  Administered 2020-06-20 – 2020-06-27 (×7): 10 mg via ORAL
  Filled 2020-06-19: qty 2
  Filled 2020-06-19 (×6): qty 1

## 2020-06-19 MED ORDER — IRBESARTAN 150 MG PO TABS
150.0000 mg | ORAL_TABLET | Freq: Every day | ORAL | Status: DC
  Filled 2020-06-19: qty 1

## 2020-06-19 MED ORDER — ASPIRIN-DIPYRIDAMOLE ER 25-200 MG PO CP12
1.0000 | ORAL_CAPSULE | Freq: Two times a day (BID) | ORAL | Status: DC
  Administered 2020-06-19 – 2020-06-27 (×15): 1 via ORAL
  Filled 2020-06-19 (×18): qty 1

## 2020-06-19 MED ORDER — ACETAMINOPHEN 325 MG PO TABS: 650.0000 mg | ORAL_TABLET | Freq: Four times a day (QID) | ORAL | Status: DC | PRN

## 2020-06-19 MED ORDER — CARBIDOPA-LEVODOPA 25-100 MG PO TABS
2.0000 | ORAL_TABLET | Freq: Three times a day (TID) | ORAL | Status: DC
  Administered 2020-06-19 – 2020-06-27 (×21): 2 via ORAL
  Filled 2020-06-19 (×23): qty 2

## 2020-06-19 MED ORDER — METOPROLOL TARTRATE 25 MG PO TABS
25.0000 mg | ORAL_TABLET | Freq: Every day | ORAL | Status: DC
  Administered 2020-06-19 – 2020-06-26 (×8): 25 mg via ORAL
  Filled 2020-06-19 (×9): qty 1

## 2020-06-19 MED ORDER — SODIUM CHLORIDE 0.9 % IV SOLN
2.0000 g | Freq: Once | INTRAVENOUS | Status: AC
  Administered 2020-06-19: 2 g via INTRAVENOUS
  Filled 2020-06-19: qty 2

## 2020-06-19 MED ORDER — EZETIMIBE 10 MG PO TABS
10.0000 mg | ORAL_TABLET | Freq: Every day | ORAL | Status: DC
  Administered 2020-06-19 – 2020-06-22 (×4): 10 mg via ORAL
  Filled 2020-06-19 (×4): qty 1

## 2020-06-19 MED ORDER — FENOFIBRATE 54 MG PO TABS
54.0000 mg | ORAL_TABLET | Freq: Every day | ORAL | Status: DC
  Administered 2020-06-19 – 2020-06-23 (×5): 54 mg via ORAL
  Filled 2020-06-19 (×5): qty 1

## 2020-06-19 MED ORDER — METOPROLOL TARTRATE 5 MG/5ML IV SOLN: 5.0000 mg | Freq: Four times a day (QID) | INTRAVENOUS | Status: DC | PRN

## 2020-06-19 MED ORDER — AZITHROMYCIN 250 MG PO TABS
500.0000 mg | ORAL_TABLET | Freq: Every day | ORAL | Status: AC
  Administered 2020-06-19: 500 mg via ORAL
  Filled 2020-06-19 (×2): qty 2

## 2020-06-19 MED ORDER — NYSTATIN 100000 UNIT/GM EX POWD
Freq: Two times a day (BID) | CUTANEOUS | Status: DC
  Filled 2020-06-19: qty 15

## 2020-06-19 MED ORDER — ACETAMINOPHEN 650 MG RE SUPP: 650.0000 mg | Freq: Four times a day (QID) | RECTAL | Status: DC | PRN

## 2020-06-19 MED ORDER — FUROSEMIDE 10 MG/ML IJ SOLN
40.0000 mg | Freq: Once | INTRAMUSCULAR | Status: AC
  Administered 2020-06-19: 40 mg via INTRAVENOUS
  Filled 2020-06-19: qty 4

## 2020-06-19 MED ORDER — SODIUM CHLORIDE 0.9% FLUSH
3.0000 mL | Freq: Two times a day (BID) | INTRAVENOUS | Status: DC
  Administered 2020-06-20 – 2020-06-27 (×13): 3 mL via INTRAVENOUS

## 2020-06-19 MED ORDER — VANCOMYCIN HCL 750 MG/150ML IV SOLN
750.0000 mg | Freq: Two times a day (BID) | INTRAVENOUS | Status: DC
  Administered 2020-06-19 – 2020-06-21 (×3): 750 mg via INTRAVENOUS
  Filled 2020-06-19 (×5): qty 150

## 2020-06-19 MED ORDER — ENOXAPARIN SODIUM 40 MG/0.4ML ~~LOC~~ SOLN
40.0000 mg | SUBCUTANEOUS | Status: DC
  Administered 2020-06-19 – 2020-06-26 (×8): 40 mg via SUBCUTANEOUS
  Filled 2020-06-19 (×8): qty 0.4

## 2020-06-19 MED ORDER — AZITHROMYCIN 250 MG PO TABS
250.0000 mg | ORAL_TABLET | Freq: Every day | ORAL | Status: DC
  Administered 2020-06-20 – 2020-06-21 (×2): 250 mg via ORAL
  Filled 2020-06-19 (×2): qty 1

## 2020-06-19 MED ORDER — SACCHAROMYCES BOULARDII 250 MG PO CAPS
250.0000 mg | ORAL_CAPSULE | Freq: Two times a day (BID) | ORAL | Status: DC
  Administered 2020-06-19 – 2020-06-23 (×8): 250 mg via ORAL
  Filled 2020-06-19 (×8): qty 1

## 2020-06-19 NOTE — Progress Notes (Signed)
A consult was received from an ED physician for vancomycin per pharmacy dosing (for an indication other than meningitis). The patient's profile has been reviewed for ht/wt/allergies/indication/available labs. A one time order has been placed for the above antibiotics.  Further antibiotics/pharmacy consults should be ordered by admitting physician if indicated.                       Reuel Boom, PharmD, BCPS [phone] 06/19/2020, 1:33 PM

## 2020-06-19 NOTE — H&P (Signed)
History and Physical        Hospital Admission Note Date: 06/19/2020  Patient name: [name] Medical record number: [id number] Date of birth: [birth year] Age: 74 y.o. Gender: male  PCP: [name], MD  Patient coming from: home. Ex wife is his caretaker and lives with son and daughter in law who is an Information systems manager Complaint  Patient presents with  . Cough  . Shortness of Breath      HPI:   This is a 74 year old male with a history of diabetes, CAD s/p CABG, PAD, HTN, HLD, chronic indwelling foley (last changed 8/30), CVA with residual left sided weakness, newly diagnosed prostate cancer with bone mets, recent hospitalization from 8/30-9/6 for septic shock thought to be from a perirectal wound/abscess also hospitalized with DKA and diastolic heart failure exacerbation and did not receive diuresis at the time due to hypotension who was discharged yesterday and returned to the ED after being brought in by EMS for shortness of breath, cough and low O2 at home, intermittently in the 70s. SpO2 was 85% on room air per EMS and improved with [place].   Per patient's ex wife at bedside, who is his caretaker, the patient has been suffering with the cough for the past week but feels it was not directly addressed since he had so many other things going on in the icu. She reports the patient has a rash in his groin which she noticed over the past day and has also noted 5 episodes of loose stool throughout the night (normally formed). Patient complains of a productive cough. He does not typically wear oxygen at home but has been requiring it here.   ED Course: Vitals: afebrile, hemodynamically stable on [place] at bedside. Notable labs: Na 132, K 5.5, CO2 18, BUN 29, Cr 1.3, albumin 2.8, Troponin 90->93, WBC 12.8, Hb 10.6, BNP 2,077 (was 1,002 on 8/30), COVID 19 negative. CXR showed  interval development of multifocal pneumonia. He was given Vancomycin and Cefepime for concern of HAP.   Vitals:   06/19/20 1530 06/19/20 1631  BP: (!) 165/68 (!) 156/68  Pulse: 86 96  Resp:  20  Temp:    SpO2: 92% 91%     Review of Systems:  Review of Systems  Constitutional: Negative for chills.       Low grade fevers at home  HENT: Negative.   Eyes: Negative.   Respiratory: Positive for cough, sputum production and shortness of breath. Negative for wheezing.   Cardiovascular: Negative for chest pain, palpitations and leg swelling.  Gastrointestinal: Positive for abdominal pain. Negative for nausea and vomiting.       Abdominal swelling  Genitourinary:       Indwelling foley  Musculoskeletal: Negative for myalgias.  Skin: Positive for rash.  Neurological: Negative.   All other systems reviewed and are negative.   Medical/Social/Family History   Past Medical History: Past Medical History:  Diagnosis Date  . At high risk for falls   . Bladder cancer [facility])    urologist-  dr Gaynelle Arabian  . CAD (coronary artery disease) cardiologist-  dr berry  . Cerebrovascular arteriosclerosis   . Gait disturbance,  post-stroke    uses cane, rollator, and w/c long distance  . Hemiparkinsonism [facility]) neurologist-- dr Leta Baptist   right side body  . History of basal cell carcinoma excision    Sept 2016--  MOH's surgery side of nose  . History of carotid artery stenosis cardiologist-- dr berry   bilateral --  1999 s/p  bilateral ICA endarterectomy and recurrent restenosis 2012  s/p  staged bilateral stenting   per last duplex 2015  stents widely patent  . History of CVA with residual deficit neurologist-  dr Leta Baptist   3/ 1999  Right MCA  and  12/ 2004  Anterior division of  Right MCA ---  residual left spactic hemiparesis and left foot drop  . History of MI (myocardial infarction)    02/ 2004   s/p  cabg   . HOH (hard of hearing)    LEFT EAR  POST cva  . Hyperextension deformity of  left knee    wears brace  . Hyperlipidemia   . Hypertension   . Left foot drop    residual from CVA  -- wears brace  . Left spastic hemiparesis ([place])    residual CVA 1999  . Pancreatic pseudocyst   . Peripheral arterial disease ([place])   . S/P CABG x 4    02/ 2004  . Seizure disorder [facility]) last seizure 2011 due to confusion   neurologist-  dr Leta Baptist-- per note seizure documented 2008 breakthrough partial complex seizure (prior tonic-clonic seizure's post CVA)  . Type 2 diabetes, diet controlled ([place])     2  . Visual neglect    LEFT EYE    Past Surgical History:  Procedure Laterality Date  . CARDIAC CATHETERIZATION  11/17/2002   significant left main disease and 3 vessel CAD, mildly depressed LV systolic  fx, 54% left renal artery stenosis  . CAROTID ENDARTERECTOMY  12/1997   [facility])   Bilateral ICA  . CAROTID STENT INSERTION Bilateral dr berry and dr Kristine Royal--  (In-stent Restenosis)  LeftICA   02-12-2011;  [place]   02-26-2011  . CORONARY ARTERY BYPASS GRAFT  2/ 2004   dr hendrickson   x3 LIMA to LAD,free right internal mammary artery to obtuse marginal 1,SVG to distal right coronary.  . CYSTOSCOPY W/ RETROGRADES Bilateral 09/10/2015   Procedure: CYSTOSCOPY WITH RETROGRADE PYELOGRAM;  Surgeon: Carolan Clines, MD;  [facility];  Service: Urology;  Laterality: Bilateral;  . CYSTOSCOPY WITH BIOPSY N/A 09/10/2015   Procedure: CYSTOSCOPY WITH BIOPSY,RIGHT TRIGONAL TUMOR 1.5 CM, EXCISIONAL BIOPSY WITH TAUBER FORCEP RIGHT POSTERIOR BLADDER WALL 1 CM, EXCISIONAL BIOPSY SATELITE BLADDER WALL 1 CM;  Surgeon: Carolan Clines, MD;  Location: [facility];  Service: Urology;  Laterality: N/A;  . CYSTOSCOPY WITH BIOPSY N/A 03/26/2017   Procedure: CYSTOSCOPY WITH BIOPSY/;  Surgeon: Carolan Clines, MD;  Location: [facility];  Service: Urology;  Laterality: N/A;  . CYSTOSCOPY WITH HYDRODISTENSION AND BIOPSY N/A 10/29/2015   Procedure: REPEAT  CYSTOSCOPY BIOPSY BLADDER DEEP MUSCLES BLADDER BIOPSY;  Surgeon: Carolan Clines, MD;  Location: [place];  Service: Urology;  Laterality: N/A;  . CYSTOSCOPY WITH URETHRAL DILATATION N/A 09/10/2015   Procedure: CYSTOSCOPY WITH URETHRAL DILATATION;  Surgeon: Carolan Clines, MD;  Location: [place];  Service: Urology;  Laterality: N/A;  . FULGURATION OF BLADDER TUMOR N/A 03/26/2017   Procedure: FULGURATION OF BLADDER TUMOR;  Surgeon: Carolan Clines, MD;  Location: [facility];  Service: Urology;  Laterality: N/A;  .  LAPAROSCOPIC CHOLECYSTECTOMY  1998  . MOHS SURGERY  06-20-2015   side of nose  . NM MYOCAR PERF WALL MOTION  01/09/2011   dr berry   mild to mod. perfusion defect in the basal inferolateral & mid inferolaterl regions consistant with an infarct/scar, no signigicant ischemia demonstracted/  Low Risk scan, no sig. change from previous study/  normal LV function and wall motion , ef 63%  . ORIF RIGHT ANKLE FX  1998   hardware retained  . TRANSURETHRAL RESECTION OF BLADDER TUMOR N/A 09/10/2015   Procedure: CYSTO TRANSURETHRAL RESECTION OF BLADDER TUMOR (TURBT) OF LEFT POSTERIOR BLADDER TUMOR 2 CM;  Surgeon: Carolan Clines, MD;  Location: [facility];  Service: Urology;  Laterality: N/A;  . TRANSURETHRAL RESECTION OF BLADDER TUMOR Left 04/17/2016   Procedure: TRANSURETHRAL RESECTION OF BLADDER TUMOR (TURBT);  Surgeon: Carolan Clines, MD;  Location: [facility];  Service: Urology;  Laterality: Left;  . US ECHOCARDIOGRAPHY  01/03/2011   normal LVF, ef>55%/  mild LAE/ trace MR and TR,  mild AV sclerosis without stenosis    Medications: Prior to Admission medications   Medication Sig Start Date End Date Taking? Authorizing Provider  amLODipine (NORVASC) 10 MG tablet TAKE ONE TABLET BY MOUTH DAILY 10/17/19   Erlene Quan, PA-C  amoxicillin-clavulanate (AUGMENTIN) 875-125 MG tablet Take 1 tablet by mouth 2 (two) times daily for 10 days.  06/18/20 06/28/20  Dwyane Dee, MD  carbidopa-levodopa (SINEMET IR) 25-100 MG tablet Take 2 tablets by mouth 3 (three) times daily. Change: Take Two tabs 3 x daily 01/16/20   Penumalli, Earlean Polka, MD  Cholecalciferol (VITAMIN D) 50 MCG (2000 UT) CAPS Take 1 capsule by mouth daily.    [provider]  Coenzyme Q10 (COQ-10) 50 MG CAPS Take 1 capsule by mouth daily.     [provider]  dantrolene (DANTRIUM) 50 MG capsule Take 1 capsule (50 mg total) by mouth 2 (two) times daily. 09/01/19   Meredith Staggers, MD  dipyridamole-aspirin (AGGRENOX) 200-25 MG 12hr capsule TAKE ONE CAPSULE BY MOUTH TWICE A DAY 03/21/20   Penumalli, Earlean Polka, MD  ezetimibe (ZETIA) 10 MG tablet TAKE ONE TABLET BY MOUTH DAILY 12/19/19   Lorretta Harp, MD  fenofibrate (TRICOR) 48 MG tablet TAKE ONE TABLET BY MOUTH DAILY 04/24/20   Lorretta Harp, MD  Flaxseed, Linseed, 1000 MG CAPS Take 1,000 mg by mouth every evening.     [provider]  FOLBIC 2.5-25-2 MG TABS tablet Take 1 tablet by mouth once daily with food Patient taking differently: Take 1 tablet by mouth daily.  05/24/20   Lomax, Amy, NP  furosemide (LASIX) 20 MG tablet Take 20 mg by mouth daily.    [provider]  hydrALAZINE (APRESOLINE) 25 MG tablet TAKE ONE TABLET BY MOUTH THREE TIMES A DAY 06/08/20   Troy Sine, MD  insulin lispro (HUMALOG KWIKPEN) 100 UNIT/ML KiwkPen Inject 4 Units into the skin 3 (three) times daily as needed (high blood sugar > 200).     [provider]  KEPPRA 500 MG tablet TAKE ONE TABLET BY MOUTH TWICE A DAY 07/27/19   Penumalli, Earlean Polka, MD  metoprolol tartrate (LOPRESSOR) 25 MG tablet TAKE ONE TABLET BY MOUTH DAILY 12/14/19   Lorretta Harp, MD  nitroGLYCERIN (NITROSTAT) 0.4 MG SL tablet Place 1 tablet (0.4 mg total) under the tongue every 5 (five) minutes as needed for chest pain. 10/19/17   Lorretta Harp, MD  olmesartan [facility])  20 MG tablet TAKE ONE TABLET BY MOUTH DAILY 03/05/20    Lorretta Harp, MD  [facility] LANCETS 85I MISC  07/29/17   [provider]  [facility] VERIO test strip  08/16/17   [provider]  oxyCODONE (ROXICODONE) 5 MG immediate release tablet Take 1 tablet (5 mg total) by mouth every 6 (six) hours as needed for severe pain. 06/18/20   Dwyane Dee, MD  RAPAFLO 8 MG CAPS capsule Take 8 mg by mouth daily with breakfast.  04/22/17   [provider]  rosuvastatin (CRESTOR) 20 MG tablet TAKE ONE TABLET BY MOUTH DAILY 04/26/20   Lorretta Harp, MD  TOUJEO SOLOSTAR 300 UNIT/ML SOPN Inject 16 Units into the skin at bedtime.  04/05/19   [provider]    Allergies:   Allergies  Allergen Reactions  . Altace [Ramipril] Cough    Social History:  reports that he quit smoking about 12 years ago. His smoking use included cigarettes. He quit after 5.00 years of use. He has never used smokeless tobacco. He reports that he does not drink alcohol and does not use drugs.  Family History: Family History  Problem Relation Age of Onset  . Stroke Father   . Heart attack Father   . Stroke Mother   . Diabetes Brother   . Hyperlipidemia Brother   . Hypertension Brother   . Hyperlipidemia Sister   . Hypertension Sister      Objective   Physical Exam: Blood pressure (!) 156/68, pulse 96, temperature 99 F (37.2 C), temperature source Rectal, resp. rate 20, SpO2 91 %.  Physical Exam Vitals and nursing note reviewed.  Constitutional:      General: He is not in acute distress.    Comments: Chronically ill appearing  HENT:     Head: Normocephalic.  Eyes:     Extraocular Movements: Extraocular movements intact.  Cardiovascular:     Rate and Rhythm: Normal rate and regular rhythm.  Pulmonary:     Effort: Pulmonary effort is normal. Tachypnea present. No accessory muscle usage.     Breath sounds: Examination of the right-lower field reveals rales. Examination of the left-lower field reveals rales. Rales present.  No wheezing.  Abdominal:     Palpations: Abdomen is soft.     Tenderness: There is no guarding.     Comments: Negative distension  Genitourinary:    Comments: Erythematous rash with satellite lesions Musculoskeletal:     Right lower leg: No tenderness. No edema.     Left lower leg: No tenderness. No edema.  Neurological:     Mental Status: He is alert and oriented to person, place, and time.     Comments: Residual left sided weakness  Psychiatric:        Mood and Affect: Mood normal.        Behavior: Behavior normal.     LABS on Admission: I have personally reviewed all the labs and imaging below    Basic Metabolic Panel: Recent Labs  Lab 06/18/20 0613 06/19/20 1308  NA 133* 132*  K 5.4* 5.5*  CL 102 103  CO2 18* 18*  GLUCOSE 101* 104*  BUN 29* 29*  CREATININE 1.34* 1.30*  CALCIUM 8.8* 8.9  MG 1.5*  --   PHOS 3.6  --    Liver Function Tests: Recent Labs  Lab 06/19/20 1308  AST 20  ALT 8  ALKPHOS 76  BILITOT 0.7  PROT 6.6  ALBUMIN 2.8*   No results for  input(s): LIPASE, AMYLASE in the last 168 hours. No results for input(s): AMMONIA in the last 168 hours. CBC: Recent Labs  Lab 06/18/20 0613 06/18/20 0613 06/19/20 1308  WBC 14.1*  --  12.8*  NEUTROABS 11.2*   < > 10.5*  HGB 9.8*  --  10.6*  HCT 30.0*  --  33.6*  MCV 91.5   < > 93.6  PLT 354  --  364   < > = values in this interval not displayed.   Cardiac Enzymes: No results for input(s): CKTOTAL, CKMB, CKMBINDEX, TROPONINI in the last 168 hours. BNP: Invalid input(s): POCBNP CBG: Recent Labs  Lab 06/18/20 0734 06/18/20 1147  GLUCAP 95 110*    Radiological Exams on Admission:  DG Chest Portable 1 View  Result Date: 06/19/2020 CLINICAL DATA:  Shortness of breath, concern for pneumonia. Productive cough. EXAM: PORTABLE CHEST 1 VIEW COMPARISON:  Chest x-ray 06/11/2020. FINDINGS: Interval removal of a right internal jugular central venous catheter. Vascular surgical clips overlying the  mediastinum. Intact sternotomy wires are again identified. The heart size is unchanged. Silhouetting off of the right and left cardiac borders as well as the medial left hemidiaphragm due to pulmonary findings. Bilateral mid to lower lung zone patchy airspace opacity. Interval development of retrocardiac opacity. No acute osseous finding. IMPRESSION: Interval development of multifocal pneumonia. Electronically Signed   By: Iven Finn M.D.   On: 06/19/2020 13:08      EKG: Independently reviewed.    A & P   Principal Problem:   Respiratory failure (HCC) Active Problems:   Seizure disorder (HCC)   Dyslipidemia, goal LDL below 70   Parkinson's disease ([place])   Essential hypertension   History of stroke   Abscess of buttock   Hyponatremia   Acute on chronic diastolic CHF (congestive heart failure) (HCC)   Type 2 diabetes mellitus without complication (HCC)   Multifocal pneumonia   Candidiasis of scrotum   1. Acute hypoxic respiratory failure, multifactorial: Diastolic heart failure exacerbation and suspected HAP a. Afebrile, leukocytosis, requiring [place]. Was hypoxic by EMS b. BNP 2077 c. CXR with multifocal pneumonia, COVID 19 negative d. Vancomycin, Cefepime, Doxycycline - discontinue vanc pending MRSA nares e. Diurese  2. Diastolic heart failure exacerbation a. BNP has increased from 1000->2000+ since previous hospitalization b. Echo 06/13/20 with EF 60-65% c. Not sure if he was truly diuresed at his last hospitalization due to hypotension/shock d. Lasix 40 mg IV x 1 and follow up volume status in AM e. Low sodium diet f. Continue home beta blocker and ARB g. Check daily weights and intake/output  3. Diarrhea, with recent antibiotics use a. Check C diff b. Patient requesting probiotics  4. Candidal infection of the groin a. Nystatin  5. UTI a. Bacteriuria and yeast in urine b. Antibiotics as above c. Urine culture  6. Chronic indwelling foley a. Foley change due  to UTI  7. Debility a. PT eval  8. Perirectal abscess with recent hospitalization for septic shock a. WOCN  9. Hyperkalemia a. Lasix as above and follow up  10. Diabetes a. Hold home meds b. Lantus and sliding scale  11. Elevated troponin, likely demand ischemia a. Troponin flat 90-93 b. Continue telemetry  12. History of prostate cancer with mets to bone a. Follows with urology b. [facility] palliative to follow up after discharge - patient wishes to remain full code at this time   59. CKD 3a, at/near baseline  14. Hyponatremia a. Likely heart failure related b. Follow  up after diuresis  15. Hypertension a. Continue beta blocker and ARB, holding hydralazine with lasix and recent hypotension  16. History of CVA with residual left sided weakness, stable a. Continue home meds  17. Seizure disorder a. Continue home keppra  18. Parkinson's  a. Continue home meds   DVT prophylaxis: lovenox   Code Status: Full Code  Diet: sodium restriction, carb modified Family Communication: Admission, patients condition and plan of care including tests being ordered have been discussed with the patient who indicates understanding and agrees with the plan and Code Status. Patient's ex wife at bedside was updated  Disposition Plan: The appropriate patient status for this patient is INPATIENT. Inpatient status is judged to be reasonable and necessary in order to provide the required intensity of service to ensure the patient's safety. The patient's presenting symptoms, physical exam findings, and initial radiographic and laboratory data in the context of their chronic comorbidities is felt to place them at high risk for further clinical deterioration. Furthermore, it is not anticipated that the patient will be medically stable for discharge from the hospital within 2 midnights of admission. The following factors support the patient status of inpatient.   " The patient's presenting symptoms  include shortness of breath, hypoxia. " The worrisome physical exam findings include chronically ill, groin rash, tachypnea. " The initial radiographic and laboratory data are worrisome because of multifocal pneumonia on CXR, leukocytosis. " The chronic co-morbidities include prostate cancer with mets to bone, indwelling foley, recent hospitalization.   * I certify that at the point of admission it is my clinical judgment that the patient will require inpatient hospital care spanning beyond 2 midnights from the point of admission due to high intensity of service, high risk for further deterioration and high frequency of surveillance required.*   Status is: Inpatient  Remains inpatient appropriate because:Ongoing diagnostic testing needed not appropriate for outpatient work up, IV treatments appropriate due to intensity of illness or inability to take PO and Inpatient level of care appropriate due to severity of illness   Dispo: The patient is from: Home              Anticipated d/c is to: SNF              Anticipated d/c date is: 3 days              Patient currently is not medically stable to d/c.        The medical decision making on this patient was of high complexity and the patient is at high risk for clinical deterioration, therefore this is a level 3  admission.  Consultants  . none  Procedures  . none  Time Spent on Admission: 80 minutes    Harold Hedge, DO [facility] Pager [phone] 06/19/2020, 5:05 PM

## 2020-06-19 NOTE — ED Provider Notes (Signed)
[place] DEPT Provider Note   CSN: [id number] Arrival date & time: 06/19/20  1116     History Chief Complaint  Patient presents with  . Cough  . Shortness of Breath    [name] is a 74 y.o. male. Presents to ER with concern for shortness of breath, weakness for the last day. Oxygen 85% on room air with EMS. Patient states he is feeling more short of breath and lately. Also having cough, yellow phlegm occasionally. No blood. No chest pain, no fevers at home. No abdominal pain, vomiting or other concerning symptom.    Last admission: DMII, CAD s/p CABG, PAD, HTN, HLD, CVA (residual left side weakness), newly diagnosed prostate cancer with bone mets who presented to the hospital on 06/11/20 with fevers and not feeling well for approximately 1 week.  HPI     Past Medical History:  Diagnosis Date  . At high risk for falls   . Bladder cancer [facility])    urologist-  dr Gaynelle Arabian  . CAD (coronary artery disease) cardiologist-  dr berry  . Cerebrovascular arteriosclerosis   . Gait disturbance, post-stroke    uses cane, rollator, and w/c long distance  . Hemiparkinsonism [facility]) neurologist-- dr Leta Baptist   right side body  . History of basal cell carcinoma excision    Sept 2016--  MOH's surgery side of nose  . History of carotid artery stenosis cardiologist-- dr berry   bilateral --  1999 s/p  bilateral ICA endarterectomy and recurrent restenosis 2012  s/p  staged bilateral stenting   per last duplex 2015  stents widely patent  . History of CVA with residual deficit neurologist-  dr Leta Baptist   3/ 1999  Right MCA  and  12/ 2004  Anterior division of  Right MCA ---  residual left spactic hemiparesis and left foot drop  . History of MI (myocardial infarction)    02/ 2004   s/p  cabg   . HOH (hard of hearing)    LEFT EAR  POST cva  . Hyperextension deformity of left knee    wears brace  . Hyperlipidemia   . Hypertension   . Left foot drop     residual from CVA  -- wears brace  . Left spastic hemiparesis ([place])    residual CVA 1999  . Pancreatic pseudocyst   . Peripheral arterial disease ([place])   . S/P CABG x 4    02/ 2004  . Seizure disorder [facility]) last seizure 2011 due to confusion   neurologist-  dr Leta Baptist-- per note seizure documented 2008 breakthrough partial complex seizure (prior tonic-clonic seizure's post CVA)  . Type 2 diabetes, diet controlled ([place])     2  . Visual neglect    LEFT EYE    Patient Active Problem List   Diagnosis Date Noted  . Multifocal pneumonia 06/19/2020  . Type 2 diabetes mellitus without complication ([place]) 73/41/9379  . Prostate cancer metastatic to bone ([place]) 06/14/2020  . Acute renal failure superimposed on stage 3a chronic kidney disease ([place]) 06/14/2020  . Acute on chronic diastolic CHF (congestive heart failure) ([place]) 06/14/2020  . Pressure ulcer of buttock, unstageable ([place]) 06/12/2020  . History of bladder cancer 08/26/2019  . Abscess of buttock 08/26/2019  . Severe sepsis with septic shock ([place]) 08/26/2019  . Hyponatremia 08/26/2019  . Normocytic anemia 08/26/2019  . Hemoptysis 08/26/2019  . Hip pain, left 08/26/2019  . CHF exacerbation ([place]) 08/26/2019  . CRI (chronic renal insufficiency), stage  3 (moderate) 04/21/2019  . History of stroke 08/18/2018  . Essential hypertension 09/25/2015  . Essential tremor 04/19/2015  . Parkinson's disease ([place]) 06/28/2014  . Hx of CABG 03/24/2014  . Dyslipidemia, goal LDL below 70 03/24/2014  . Seizure disorder ([place]) 03/08/2013  . Spastic hemiplegia affecting nondominant side ([place]) 03/29/2012  . Carotid stenosis 10/20/2011    Past Surgical History:  Procedure Laterality Date  . CARDIAC CATHETERIZATION  11/17/2002   significant left main disease and 3 vessel CAD, mildly depressed LV systolic  fx, 16% left renal artery stenosis  . CAROTID ENDARTERECTOMY  12/1997   [facility])   Bilateral ICA  . CAROTID STENT INSERTION Bilateral dr berry and  dr Kristine Royal--  (In-stent Restenosis)  LeftICA   02-12-2011;  [place]   02-26-2011  . CORONARY ARTERY BYPASS GRAFT  2/ 2004   dr hendrickson   x3 LIMA to LAD,free right internal mammary artery to obtuse marginal 1,SVG to distal right coronary.  . CYSTOSCOPY W/ RETROGRADES Bilateral 09/10/2015   Procedure: CYSTOSCOPY WITH RETROGRADE PYELOGRAM;  Surgeon: Carolan Clines, MD;  Location[facility];  Service: Urology;  Laterality: Bilateral;  . CYSTOSCOPY WITH BIOPSY N/A 09/10/2015   Procedure: CYSTOSCOPY WITH BIOPSY,RIGHT TRIGONAL TUMOR 1.5 CM, EXCISIONAL BIOPSY WITH TAUBER FORCEP RIGHT POSTERIOR BLADDER WALL 1 CM, EXCISIONAL BIOPSY SATELITE BLADDER WALL 1 CM;  Surgeon: Carolan Clines, MD;  [facility];  Service: Urology;  Laterality: N/A;  . CYSTOSCOPY WITH BIOPSY N/A 03/26/2017   Procedure: CYSTOSCOPY WITH BIOPSY/;  Surgeon: Carolan Clines, MD;  Location: [facility];  Service: Urology;  Laterality: N/A;  . CYSTOSCOPY WITH HYDRODISTENSION AND BIOPSY N/A 10/29/2015   Procedure: REPEAT CYSTOSCOPY BIOPSY BLADDER DEEP MUSCLES BLADDER BIOPSY;  Surgeon: Carolan Clines, MD;  Location: [place];  Service: Urology;  Laterality: N/A;  . CYSTOSCOPY WITH URETHRAL DILATATION N/A 09/10/2015   Procedure: CYSTOSCOPY WITH URETHRAL DILATATION;  Surgeon: Carolan Clines, MD;  Location: [place];  Service: Urology;  Laterality: N/A;  . FULGURATION OF BLADDER TUMOR N/A 03/26/2017   Procedure: FULGURATION OF BLADDER TUMOR;  Surgeon: Carolan Clines, MD;  Location: [facility];  Service: Urology;  Laterality: N/A;  . [place]  . MOHS SURGERY  06-20-2015   side of nose  . NM MYOCAR PERF WALL MOTION  01/09/2011   dr berry   mild to mod. perfusion defect in the basal inferolateral & mid inferolaterl regions consistant with an infarct/scar, no signigicant ischemia demonstracted/  Low Risk scan, no sig.  change from previous study/  normal LV function and wall motion , ef 63%  . ORIF RIGHT ANKLE FX  1998   hardware retained  . TRANSURETHRAL RESECTION OF BLADDER TUMOR N/A 09/10/2015   Procedure: CYSTO TRANSURETHRAL RESECTION OF BLADDER TUMOR (TURBT) OF LEFT POSTERIOR BLADDER TUMOR 2 CM;  Surgeon: Carolan Clines, MD;  Location: [facility];  Service: Urology;  Laterality: N/A;  . TRANSURETHRAL RESECTION OF BLADDER TUMOR Left 04/17/2016   Procedure: TRANSURETHRAL RESECTION OF BLADDER TUMOR (TURBT);  Surgeon: Carolan Clines, MD;  Location[facility];  Service: Urology;  Laterality: Left;  . US ECHOCARDIOGRAPHY  01/03/2011   normal LVF, ef>55%/  mild LAE/ trace MR and TR,  mild AV sclerosis without stenosis       Family History  Problem Relation Age of Onset  . Stroke Father   . Heart attack Father   . Stroke Mother   . Diabetes Brother   .  Hyperlipidemia Brother   . Hypertension Brother   . Hyperlipidemia Sister   . Hypertension Sister     Social History   Tobacco Use  . Smoking status: Former Smoker    Years: 5.00    Types: Cigarettes    Quit date: 11/14/2007    Years since quitting: 12.6  . Smokeless tobacco: Never Used  Vaping Use  . Vaping Use: Never used  Substance Use Topics  . Alcohol use: No  . Drug use: No    Home Medications Prior to Admission medications   Medication Sig Start Date End Date Taking? Authorizing Provider  amLODipine (NORVASC) 10 MG tablet TAKE ONE TABLET BY MOUTH DAILY 10/17/19   Erlene Quan, PA-C  amoxicillin-clavulanate (AUGMENTIN) 875-125 MG tablet Take 1 tablet by mouth 2 (two) times daily for 10 days. 06/18/20 06/28/20  Dwyane Dee, MD  carbidopa-levodopa (SINEMET IR) 25-100 MG tablet Take 2 tablets by mouth 3 (three) times daily. Change: Take Two tabs 3 x daily 01/16/20   Penumalli, Earlean Polka, MD  Cholecalciferol (VITAMIN D) 50 MCG (2000 UT) CAPS Take 1 capsule by mouth daily.    [provider]  Coenzyme Q10 (COQ-10)  50 MG CAPS Take 1 capsule by mouth daily.     [provider]  dantrolene (DANTRIUM) 50 MG capsule Take 1 capsule (50 mg total) by mouth 2 (two) times daily. 09/01/19   Meredith Staggers, MD  dipyridamole-aspirin (AGGRENOX) 200-25 MG 12hr capsule TAKE ONE CAPSULE BY MOUTH TWICE A DAY 03/21/20   Penumalli, Earlean Polka, MD  ezetimibe (ZETIA) 10 MG tablet TAKE ONE TABLET BY MOUTH DAILY 12/19/19   Lorretta Harp, MD  fenofibrate (TRICOR) 48 MG tablet TAKE ONE TABLET BY MOUTH DAILY 04/24/20   Lorretta Harp, MD  Flaxseed, Linseed, 1000 MG CAPS Take 1,000 mg by mouth every evening.     [provider]  FOLBIC 2.5-25-2 MG TABS tablet Take 1 tablet by mouth once daily with food Patient taking differently: Take 1 tablet by mouth daily.  05/24/20   Lomax, Amy, NP  furosemide (LASIX) 20 MG tablet Take 20 mg by mouth daily.    [provider]  hydrALAZINE (APRESOLINE) 25 MG tablet TAKE ONE TABLET BY MOUTH THREE TIMES A DAY 06/08/20   Troy Sine, MD  insulin lispro (HUMALOG KWIKPEN) 100 UNIT/ML KiwkPen Inject 4 Units into the skin 3 (three) times daily as needed (high blood sugar > 200).     [provider]  KEPPRA 500 MG tablet TAKE ONE TABLET BY MOUTH TWICE A DAY 07/27/19   Penumalli, Earlean Polka, MD  metoprolol tartrate (LOPRESSOR) 25 MG tablet TAKE ONE TABLET BY MOUTH DAILY 12/14/19   Lorretta Harp, MD  nitroGLYCERIN (NITROSTAT) 0.4 MG SL tablet Place 1 tablet (0.4 mg total) under the tongue every 5 (five) minutes as needed for chest pain. 10/19/17   Lorretta Harp, MD  olmesartan (BENICAR) 20 MG tablet TAKE ONE TABLET BY MOUTH DAILY 03/05/20   Lorretta Harp, MD  [facility] DELICA LANCETS 44W MISC  07/29/17   [provider]  [facility] VERIO test strip  08/16/17   [provider]  oxyCODONE (ROXICODONE) 5 MG immediate release tablet Take 1 tablet (5 mg total) by mouth every 6 (six) hours as needed for severe pain. 06/18/20   Dwyane Dee, MD  RAPAFLO 8  MG CAPS capsule Take 8 mg by mouth daily with breakfast.  04/22/17   [provider]  rosuvastatin (CRESTOR)  20 MG tablet TAKE ONE TABLET BY MOUTH DAILY 04/26/20   Lorretta Harp, MD  TOUJEO SOLOSTAR 300 UNIT/ML SOPN Inject 16 Units into the skin at bedtime.  04/05/19   [provider]    Allergies    Altace [ramipril]  Review of Systems   Review of Systems  Constitutional: Negative for chills and fever.  HENT: Negative for ear pain and sore throat.   Eyes: Negative for pain and visual disturbance.  Respiratory: Positive for cough and shortness of breath.   Cardiovascular: Negative for chest pain and palpitations.  Gastrointestinal: Negative for abdominal pain and vomiting.  Genitourinary: Negative for dysuria and hematuria.  Musculoskeletal: Negative for arthralgias and back pain.  Skin: Negative for color change and rash.  Neurological: Negative for seizures and syncope.  All other systems reviewed and are negative.   Physical Exam Updated Vital Signs BP 138/62 (BP Location: Right Arm)   Pulse 92   Temp 99 F (37.2 C) (Rectal)   Resp (!) 28   SpO2 90%   Physical Exam Vitals and nursing note reviewed.  Constitutional:      Appearance: He is well-developed.  HENT:     Head: Normocephalic and atraumatic.  Eyes:     Conjunctiva/sclera: Conjunctivae normal.  Cardiovascular:     Rate and Rhythm: Normal rate and regular rhythm.     Heart sounds: No murmur heard.   Pulmonary:     Comments: Mild tachypnea, coarse breath sounds bilaterally Abdominal:     Palpations: Abdomen is soft.     Tenderness: There is no abdominal tenderness.  Musculoskeletal:     Cervical back: Neck supple.     Right lower leg: No edema.     Left lower leg: No edema.     Comments: Sacral decubitus wound, no overlying erythema, no induration appreciated  Skin:    General: Skin is warm and dry.  Neurological:     Mental Status: He is alert.     ED Results / Procedures /  Treatments   Labs (all labs ordered are listed, but only abnormal results are displayed) Labs Reviewed  CBC WITH DIFFERENTIAL/PLATELET - Abnormal; Notable for the following components:      Result Value   WBC 12.8 (*)    RBC 3.59 (*)    Hemoglobin 10.6 (*)    HCT 33.6 (*)    RDW 21.5 (*)    Neutro Abs 10.5 (*)    Monocytes Absolute 1.1 (*)    Abs Immature Granulocytes 0.13 (*)    All other components within normal limits  COMPREHENSIVE METABOLIC PANEL - Abnormal; Notable for the following components:   Sodium 132 (*)    Potassium 5.5 (*)    CO2 18 (*)    Glucose, Bld 104 (*)    BUN 29 (*)    Creatinine, Ser 1.30 (*)    Albumin 2.8 (*)    GFR calc non Af Amer 53 (*)    All other components within normal limits  BRAIN NATRIURETIC PEPTIDE - Abnormal; Notable for the following components:   B Natriuretic Peptide 2,077.1 (*)    All other components within normal limits  URINALYSIS, ROUTINE W REFLEX MICROSCOPIC - Abnormal; Notable for the following components:   APPearance CLOUDY (*)    Hgb urine dipstick MODERATE (*)    Ketones, ur 5 (*)    Protein, ur 30 (*)    Leukocytes,Ua LARGE (*)    RBC / HPF >50 (*)    WBC,  UA >50 (*)    Bacteria, UA MANY (*)    Non Squamous Epithelial 0-5 (*)    All other components within normal limits  TROPONIN I (HIGH SENSITIVITY) - Abnormal; Notable for the following components:   Troponin I (High Sensitivity) 90 (*)    All other components within normal limits  SARS CORONAVIRUS 2 BY RT PCR (HOSPITAL ORDER, [facility])  CULTURE, BLOOD (ROUTINE X 2)  CULTURE, BLOOD (ROUTINE X 2)  LACTIC ACID, PLASMA  TROPONIN I (HIGH SENSITIVITY)    EKG EKG Interpretation  Date/Time:  Tuesday June 19 2020 11:48:44 EDT Ventricular Rate:  88 PR Interval:    QRS Duration: 98 QT Interval:  364 QTC Calculation: 441 R Axis:   68 Text Interpretation: Sinus rhythm Repol abnrm suggests ischemia, anterolateral since last tracing  no significant change Confirmed by Daleen Bo [phone]) on 06/19/2020 11:51:43 AM   Radiology DG Chest Portable 1 View  Result Date: 06/19/2020 CLINICAL DATA:  Shortness of breath, concern for pneumonia. Productive cough. EXAM: PORTABLE CHEST 1 VIEW COMPARISON:  Chest x-ray 06/11/2020. FINDINGS: Interval removal of a right internal jugular central venous catheter. Vascular surgical clips overlying the mediastinum. Intact sternotomy wires are again identified. The heart size is unchanged. Silhouetting off of the right and left cardiac borders as well as the medial left hemidiaphragm due to pulmonary findings. Bilateral mid to lower lung zone patchy airspace opacity. Interval development of retrocardiac opacity. No acute osseous finding. IMPRESSION: Interval development of multifocal pneumonia. Electronically Signed   By: Iven Finn M.D.   On: 06/19/2020 13:08    Procedures .Critical Care Performed by: Lucrezia Starch, MD Authorized by: Lucrezia Starch, MD   Critical care provider statement:    Critical care time (minutes):  36   Critical care was necessary to treat or prevent imminent or life-threatening deterioration of the following conditions:  Respiratory failure   Critical care was time spent personally by me on the following activities:  Discussions with consultants, evaluation of patient's response to treatment, examination of patient, ordering and performing treatments and interventions, ordering and review of laboratory studies, ordering and review of radiographic studies, pulse oximetry, re-evaluation of patient's condition, obtaining history from patient or surrogate and review of old charts   (including critical care time)  Medications Ordered in ED Medications  vancomycin (VANCOCIN) IVPB 1000 mg/200 mL premix (has no administration in time range)  ceFEPIme (MAXIPIME) 2 g in sodium chloride 0.9 % 100 mL IVPB (2 g Intravenous New Bag/Given 06/19/20 1345)    ED Course  I  have reviewed the triage vital signs and the nursing notes.  Pertinent labs & imaging results that were available during my care of the patient were reviewed by me and considered in my medical decision making (see chart for details).  Clinical Course as of Jun 19 1530  Tue Jun 19, 2020  1321 X-ray concerning for multifocal pneumonia, will check Covid, will start empiric broad-spectrum antibiotics for HCAP   [RD]    Clinical Course User Index [RD] Lucrezia Starch, MD   MDM Rules/Calculators/A&P                         74 year old male presents to ER with concern for shortness of breath, cough. Mildly hypoxic requiring 2 L nasal cannula. Recent admission for septic shock thought to be from gluteal wound. Chest x-ray today demonstrating development of multifocal pneumonia. Covid is negative. Started  on broad-spectrum antibiotics with concern for possible healthcare associated pneumonia. Will admit to the hospital service for further management. Also on differential would consider heart failure given his elevated BNP; however he just had an echo which showed EF 65-80%, mild diastolic dysfunction.  Dr. Neysa Bonito will accept for further management.   Final Clinical Impression(s) / ED Diagnoses Final diagnoses:  Acute respiratory failure with hypoxia ([place])  Multifocal pneumonia    Rx / DC Orders ED Discharge Orders    None       Lucrezia Starch, MD 06/19/20 1531

## 2020-06-19 NOTE — ED Triage Notes (Signed)
Patient reports to the ER from home BIB EMS. Patient was discharged yesterday from the hospital. Patient's Oxygen was reportedly low at home. Patient reported to have clear lung sounds but has had a wet cough and yellow phlegm. Patient's O2 sat was 85% on RA with EMS.  Other vitals stable with EMS.

## 2020-06-19 NOTE — Progress Notes (Signed)
[facility]er, [facility]ell[facility] based Palliative Program   This patient has been referred to our community based Palliative Program and was brought to the hospital prior to first visit. [name] will continue to follow during this hospitalization then will schedule Luis admission visit to our Palliative Services upon discharge.  Gar Ponto, RN [facility]  [phone]

## 2020-06-19 NOTE — Telephone Encounter (Signed)
Received message that patient's ex- wife called requesting a call back. Phone call placed to [facility] who shared that patient was d/c from hospital yesterday. At that time, his oxygen saturation was 88-90 % on room air and did not display shortness of breath. Patient is now with oxygen saturation ranging between 75-81% on room air. Patient is positioned in his hospital bed with head of bed elevated. Patient is expressing shortness of breath. Discussed options with Colletta Maryland regarding hospice services vs going back to hospital. Colletta Maryland shared that patient decided to seek treatment. Recommendations provided that patient should go back to the hospital. Hospital liaisons and Palliative team made aware.

## 2020-06-19 NOTE — ED Notes (Signed)
Pt came from EMS with catheter.

## 2020-06-19 NOTE — Progress Notes (Signed)
Pharmacy Antibiotic Note  [name] is a 74 y.o. male admitted on 06/19/2020 with pneumonia, UTI.  Pharmacy has been consulted for Vancomycin and Cefepime dosing.  Plan: Vancomycin 1g IV x 1 given in the ED. Continue with Vancomycin 750mg  IV q12h. Vancomycin trough level at steady state, as indicated. Cefepime 2g IV q12h. Azithromycin per MD. Monitor renal function, cultures, clinical course.      Temp (24hrs), Avg:98.8 F (37.1 C), Min:98.6 F (37 C), Max:99 F (37.2 C)  Recent Labs  Lab 06/15/20 0529 06/16/20 0554 06/17/20 0559 06/18/20 0613 06/19/20 1308  WBC 17.0* 17.6* 15.5* 14.1* 12.8*  CREATININE 1.95* 1.42* 1.25* 1.34* 1.30*  LATICACIDVEN  --   --   --   --  0.7    Estimated Creatinine Clearance: 45.7 mL/min (A) (by C-G formula based on SCr of 1.3 mg/dL (H)).    Allergies  Allergen Reactions  . Altace [Ramipril] Cough    Antimicrobials this admission: 9/7 Vancomycin >> 9/7 Cefepime >> 9/7 Azithromycin >>  Dose adjustments this admission: --  Microbiology results: 9/7 BCx: sent 9/7 UCx:  9/7 MRSA PCR:  9/7 C.diff PCR: 9/7 COVID: negative   Thank you for allowing pharmacy to be a part of this patient's care.   [name] 06/19/2020 5:26 PM

## 2020-06-20 ENCOUNTER — Other Ambulatory Visit: Payer: Self-pay

## 2020-06-20 LAB — GLUCOSE, CAPILLARY
Glucose-Capillary: 152 mg/dL — ABNORMAL HIGH (ref 70–99)
Glucose-Capillary: 191 mg/dL — ABNORMAL HIGH (ref 70–99)

## 2020-06-20 LAB — C DIFFICILE QUICK SCREEN W PCR REFLEX
C Diff antigen: NEGATIVE
C Diff interpretation: NOT DETECTED
C Diff toxin: NEGATIVE

## 2020-06-20 LAB — URINE CULTURE

## 2020-06-20 LAB — BASIC METABOLIC PANEL
Anion gap: 8 (ref 5–15)
BUN: 29 mg/dL — ABNORMAL HIGH (ref 8–23)
CO2: 20 mmol/L — ABNORMAL LOW (ref 22–32)
Calcium: 8.9 mg/dL (ref 8.9–10.3)
Chloride: 103 mmol/L (ref 98–111)
Creatinine, Ser: 1.31 mg/dL — ABNORMAL HIGH (ref 0.61–1.24)
GFR calc Af Amer: >60 mL/min (ref >60)
GFR calc non Af Amer: 53 mL/min — ABNORMAL LOW (ref >60)
Glucose, Bld: 124 mg/dL — ABNORMAL HIGH (ref 70–99)
Potassium: 4.8 mmol/L (ref 3.5–5.1)
Sodium: 131 mmol/L — ABNORMAL LOW (ref 135–145)

## 2020-06-20 LAB — CBC
HCT: 31.1 % — ABNORMAL LOW (ref 39.0–52.0)
Hemoglobin: 9.9 g/dL — ABNORMAL LOW (ref 13.0–17.0)
MCH: 30 pg (ref 26.0–34.0)
MCHC: 31.8 g/dL (ref 30.0–36.0)
MCV: 94.2 fL (ref 80.0–100.0)
Platelets: 328 10*3/uL (ref 150–400)
RBC: 3.3 MIL/uL — ABNORMAL LOW (ref 4.22–5.81)
RDW: 21.3 % — ABNORMAL HIGH (ref 11.5–15.5)
WBC: 12.1 10*3/uL — ABNORMAL HIGH (ref 4.0–10.5)
nRBC: 0 % (ref 0.0–0.2)

## 2020-06-20 LAB — CBG MONITORING, ED: Glucose-Capillary: 107 mg/dL — ABNORMAL HIGH (ref 70–99)

## 2020-06-20 LAB — MRSA PCR SCREENING: MRSA by PCR: NEGATIVE

## 2020-06-20 MED ORDER — GERHARDT'S BUTT CREAM
TOPICAL_CREAM | Freq: Three times a day (TID) | CUTANEOUS | Status: DC
  Administered 2020-06-21 – 2020-06-26 (×5): 1 via TOPICAL
  Filled 2020-06-20 (×2): qty 1

## 2020-06-20 NOTE — ED Notes (Signed)
Patient moved to a hospital bed and re-positioned on his side for comfort.

## 2020-06-20 NOTE — Progress Notes (Signed)
PROGRESS NOTE    [name]  FYB:[id number] DOB: 1946-08-03 DOA: 06/19/2020 PCP: [name], MD  Brief Narrative:  74 year old white male CABG + PAD with resulting HFpEF (EF 52-77% grade 2 diastolic dysfunction), HTN Prior rectal abscess with recent hospitalization for septic shock New (04/2020) prostate cancer with mets to the bone followed by Dr. Alen Blew--- very debilitated after last hospital stay DM TY 2 Parkinson's disease, prior CVA, seizure disorder CKD 3-3 B baseline creatinine 1.9  Hospitalization 8/30 through 06/18/2020 with septic shock in addition to DKA secondary to perirectal abscess-received hydrotherapy-General surgery consulted did not feel surgical debridement was recommended-received broad-spectrum antibiotics tailored to Augmentin on discharge for 10 more days of Augmentin and was to follow-up with wound care center on discharge Also was supposed to follow-up with urology to commence androgen deprivation therapy  Discharged to the care of his ex-wife who is his caretaker-had developed a cough in the past week in addition to a rash in the groin Also had 5 loose stools In emergency room sodium 132 potassium 5.5 WBC 12.8 BNP 2077 CXR?  Multifocal pneumonia Started on vancomycin and cefepime C. difficile ordered    Assessment & Plan:   Principal Problem:   Respiratory failure ([place]) Active Problems:   Seizure disorder (HCC)   Dyslipidemia, goal LDL below 70   Parkinson's disease ([place])   Essential hypertension   History of stroke   Abscess of buttock   Hyponatremia   Acute on chronic diastolic CHF (congestive heart failure) (HCC)   Type 2 diabetes mellitus without complication (HCC)   Multifocal pneumonia   Candidiasis of scrotum   1. Sepsis possibly healthcare associated pneumonia but differential could include abscess of perirectal area a. Continue empiric coverage with vancomycin azithromycin and cefepime b. Repeat 2 view CXR in a.m. as unclear  source but does seem to be more in keeping with pneumonia-ex-wife tells me that was coughing prior to discharge c. If x-ray as above is negative and/or more consistent with fluid we will need to speak to general surgery d. I probed the wound and did not notice any pus and per verbal report from his caregiver who lives with him, the wound looks better to her then it did on discharge e. Foley catheter was changed 8/30-he might be colonized urine culture from 9/7 in process f. Follow blood culture X 9/7 2. C. difficile ruled out 3. HFpEF acute decompensated on admission a. BNP elevated probably secondary to heart failure but could be confounded by with infection b. Lasix home dosing is 20 mg, I will continue IV 40 mg daily and watch creatinine c. Holding Benicar 20 daily d. Resume carefully beta-blocker in the next several days e. May resume amlodipine 10 4. Hyperkalemia on admission 5. Mild metabolic acidosis probably combination of sepsis and AKI a. Holding ARB as above given hyperkalemia which is improved now b. CO2 is improved from several days ago 6. New diagnosis of prostate cancer 04/2020 a. We will CC Dr. Alen Blew b. Continue Rapaflo 8 mg every morning 7. DM TY 2 with recent DKA a. Blood sugars ranging 10 7-1 33 b. Currently on Lantus 10 units (home dose 16) c. Follow trends 8. CVA, seizure a. Continue statins continue Aggrenox once daily 9. Parkinson's a. Can continue carbidopa 2 tablets 3 times a day b. Monitor 10. Normocytic anemia with component of anemia from malignancy a. Outpatient management b. Hemoglobin is stable in the 9 range  DVT prophylaxis: Lovenox Code Status: Full code Family Communication:  Discussed with ex-wife at the bedside Disposition: Inpatient  Status is: Inpatient  Remains inpatient appropriate because:Hemodynamically unstable, Persistent severe electrolyte disturbances, Unsafe d/c plan and IV treatments appropriate due to intensity of illness or  inability to take PO   Dispo: The patient is from: Home              Anticipated d/c is to: Unclear at this time              Anticipated d/c date is: > 3 days              Patient currently is not medically stable to d/c.       Consultants:   None at this time  Procedures: No  Antimicrobials: As above   Subjective: Awake alert coherent No fever no chills at this time Ex-wife tells me that his usual weight is close to 150 but he is actually 170 when he came in He was quite swollen as well He has been seen several times with [facility] for perirectal abscesses some of which have burst in the past Apparently he is not a surgical candidate secondary to him having multiple strokes when he was taken off of his Aggrenox previously She tells me that the wound looks clean according to her when I looked at it with her  Objective: Vitals:   06/20/20 0530 06/20/20 0600 06/20/20 0630 06/20/20 0700  BP: (!) 155/71 (!) 159/68 (!) 163/67 (!) 167/73  Pulse: 78 82 78 84  Resp: (!) 24 (!) 21 (!) 23 (!) 21  Temp:      TempSrc:      SpO2: 94% 93% 96% 93%    Intake/Output Summary (Last 24 hours) at 06/20/2020 0730 Last data filed at 06/20/2020 0153 Gross per 24 hour  Intake 99.78 ml  Output 2100 ml  Net -2000.22 ml   There were no vitals filed for this visit.  Examination:  General exam: EOMI NCAT no focal deficit but flat affect he does have a mild tremor Respiratory system: Clear no added sound no rales no rhonchi-no fremitus resonance Cardiovascular system: S1-S2 no murmur rub or gallop Gastrointestinal system: Abdomen soft no rebound no guarding Foley in place. Central nervous system: Flexion contractures to left upper extremity with limited ROM also does have a intention tremor ROM is grossly intact but patient is quite weak Extremities: As above Skin:      Psychiatry: Flat affect  Data Reviewed: I have personally reviewed following labs and imaging  studies  Sodium down from 1 32-1 31 Potassium down from 5.5-4.8 CO2 18-->20 BUNs/creatinine 29/1.3-->29/1.31 White count 12.8-->12.1 hemoglobin 9.9 Platelet 328  Radiology Studies: DG Chest Portable 1 View  Result Date: 06/19/2020 CLINICAL DATA:  Shortness of breath, concern for pneumonia. Productive cough. EXAM: PORTABLE CHEST 1 VIEW COMPARISON:  Chest x-ray 06/11/2020. FINDINGS: Interval removal of a right internal jugular central venous catheter. Vascular surgical clips overlying the mediastinum. Intact sternotomy wires are again identified. The heart size is unchanged. Silhouetting off of the right and left cardiac borders as well as the medial left hemidiaphragm due to pulmonary findings. Bilateral mid to lower lung zone patchy airspace opacity. Interval development of retrocardiac opacity. No acute osseous finding. IMPRESSION: Interval development of multifocal pneumonia. Electronically Signed   By: Iven Finn M.D.   On: 06/19/2020 13:08     Scheduled Meds: . amLODipine  10 mg Oral Daily  . azithromycin  250 mg Oral Daily  . carbidopa-levodopa  2 tablet  Oral TID  . dantrolene  50 mg Oral BID  . dipyridamole-aspirin  1 capsule Oral BID  . enoxaparin (LOVENOX) injection  40 mg Subcutaneous Q24H  . ezetimibe  10 mg Oral Daily  . fenofibrate  54 mg Oral Daily  . Gerhardt's butt cream   Topical TID  . insulin aspart  0-9 Units Subcutaneous TID WC  . insulin glargine  10 Units Subcutaneous QHS  . levETIRAcetam  500 mg Oral BID  . metoprolol tartrate  25 mg Oral Daily  . rosuvastatin  20 mg Oral Daily  . saccharomyces boulardii  250 mg Oral BID  . sodium chloride flush  3 mL Intravenous Q12H  . tamsulosin  0.4 mg Oral Daily   Continuous Infusions: . ceFEPime (MAXIPIME) IV Stopped (06/20/20 0153)  . vancomycin Stopped (06/20/20 0051)     LOS: 1 day    Time spent: [place], MD [facility] To contact the attending provider between 7A-7P or the  covering provider during after hours 7P-7A, please log into the web site [email or web address] and access using universal [place] password for that web site. If you do not have the password, please call the hospital operator.  06/20/2020, 7:30 AM

## 2020-06-20 NOTE — Evaluation (Signed)
SLP Cancellation Note  Patient Details Name: ROBERT SPERL MRN: [id number] DOB: 1946/03/11   Cancelled treatment:       Reason Eval/Treat Not Completed: Other (comment);Patient at procedure or test/unavailable (RN reports getting ready to transfer pt to floor, will continue efforts)   Macario Golds 06/20/2020, 12:17 PM  Kathleen Lime, MS [place] Office [phone]

## 2020-06-20 NOTE — Evaluation (Signed)
Clinical/Bedside Swallow Evaluation Patient Details  Name: [name] MRN: [id number] Date of Birth: 11-06-1945  Today's Date: 06/20/2020 Time: SLP Start Time (ACUTE ONLY): 47 SLP Stop Time (ACUTE ONLY): 1701 SLP Time Calculation (min) (ACUTE ONLY): 46 min  Past Medical History:  Past Medical History:  Diagnosis Date  . At high risk for falls   . Bladder cancer [facility])    urologist-  dr Gaynelle Arabian  . CAD (coronary artery disease) cardiologist-  dr berry  . Cerebrovascular arteriosclerosis   . Gait disturbance, post-stroke    uses cane, rollator, and w/c long distance  . Hemiparkinsonism [facility]) neurologist-- dr Leta Baptist   right side body  . History of basal cell carcinoma excision    Sept 2016--  MOH's surgery side of nose  . History of carotid artery stenosis cardiologist-- dr berry   bilateral --  1999 s/p  bilateral ICA endarterectomy and recurrent restenosis 2012  s/p  staged bilateral stenting   per last duplex 2015  stents widely patent  . History of CVA with residual deficit neurologist-  dr Leta Baptist   3/ 1999  Right MCA  and  12/ 2004  Anterior division of  Right MCA ---  residual left spactic hemiparesis and left foot drop  . History of MI (myocardial infarction)    02/ 2004   s/p  cabg   . HOH (hard of hearing)    LEFT EAR  POST cva  . Hyperextension deformity of left knee    wears brace  . Hyperlipidemia   . Hypertension   . Left foot drop    residual from CVA  -- wears brace  . Left spastic hemiparesis ([place])    residual CVA 1999  . Pancreatic pseudocyst   . Peripheral arterial disease ([place])   . S/P CABG x 4    02/ 2004  . Seizure disorder [facility]) last seizure 2011 due to confusion   neurologist-  dr Leta Baptist-- per note seizure documented 2008 breakthrough partial complex seizure (prior tonic-clonic seizure's post CVA)  . Type 2 diabetes, diet controlled ([place])     2  . Visual neglect    LEFT EYE   Past Surgical History:  Past Surgical History:   Procedure Laterality Date  . CARDIAC CATHETERIZATION  11/17/2002   significant left main disease and 3 vessel CAD, mildly depressed LV systolic  fx, 86% left renal artery stenosis  . CAROTID ENDARTERECTOMY  12/1997   [facility])   Bilateral ICA  . CAROTID STENT INSERTION Bilateral dr berry and dr Kristine Royal--  (In-stent Restenosis)  LeftICA   02-12-2011;  [place]   02-26-2011  . CORONARY ARTERY BYPASS GRAFT  2/ 2004   dr hendrickson   x3 LIMA to LAD,free right internal mammary artery to obtuse marginal 1,SVG to distal right coronary.  . CYSTOSCOPY W/ RETROGRADES Bilateral 09/10/2015   Procedure: CYSTOSCOPY WITH RETROGRADE PYELOGRAM;  Surgeon: Carolan Clines, MD;  Location: [place];  Service: Urology;  Laterality: Bilateral;  . CYSTOSCOPY WITH BIOPSY N/A 09/10/2015   Procedure: CYSTOSCOPY WITH BIOPSY,RIGHT TRIGONAL TUMOR 1.5 CM, EXCISIONAL BIOPSY WITH TAUBER FORCEP RIGHT POSTERIOR BLADDER WALL 1 CM, EXCISIONAL BIOPSY SATELITE BLADDER WALL 1 CM;  Surgeon: Carolan Clines, MD;  Location: [facility];  Service: Urology;  Laterality: N/A;  . CYSTOSCOPY WITH BIOPSY N/A 03/26/2017   Procedure: CYSTOSCOPY WITH BIOPSY/;  Surgeon: Carolan Clines, MD;  Location: [facility];  Service: Urology;  Laterality: N/A;  . CYSTOSCOPY WITH HYDRODISTENSION AND BIOPSY  N/A 10/29/2015   Procedure: REPEAT CYSTOSCOPY BIOPSY BLADDER DEEP MUSCLES BLADDER BIOPSY;  Surgeon: Carolan Clines, MD;  Location: [facility];  Service: Urology;  Laterality: N/A;  . CYSTOSCOPY WITH URETHRAL DILATATION N/A 09/10/2015   Procedure: CYSTOSCOPY WITH URETHRAL DILATATION;  Surgeon: Carolan Clines, MD;  Location: [place];  Service: Urology;  Laterality: N/A;  . FULGURATION OF BLADDER TUMOR N/A 03/26/2017   Procedure: FULGURATION OF BLADDER TUMOR;  Surgeon: Carolan Clines, MD;  Location: [facility];  Service: Urology;  Laterality: N/A;  . [place]  . MOHS SURGERY  06-20-2015   side of nose  . NM MYOCAR PERF WALL MOTION  01/09/2011   dr berry   mild to mod. perfusion defect in the basal inferolateral & mid inferolaterl regions consistant with an infarct/scar, no signigicant ischemia demonstracted/  Low Risk scan, no sig. change from previous study/  normal LV function and wall motion , ef 63%  . ORIF RIGHT ANKLE FX  1998   hardware retained  . TRANSURETHRAL RESECTION OF BLADDER TUMOR N/A 09/10/2015   Procedure: CYSTO TRANSURETHRAL RESECTION OF BLADDER TUMOR (TURBT) OF LEFT POSTERIOR BLADDER TUMOR 2 CM;  Surgeon: Carolan Clines, MD;  Location[facility];  Service: Urology;  Laterality: N/A;  . TRANSURETHRAL RESECTION OF BLADDER TUMOR Left 04/17/2016   Procedure: TRANSURETHRAL RESECTION OF BLADDER TUMOR (TURBT);  Surgeon: Carolan Clines, MD;  Location[facility];  Service: Urology;  Laterality: Left;  . US ECHOCARDIOGRAPHY  01/03/2011   normal LVF, ef>55%/  mild LAE/ trace MR and TR,  mild AV sclerosis without stenosis   HPI:  74 yo with h/o prostate cancer with bone mets on palliative treatment admitted to [facility] with respiratory failure with findings of heart failure and pna.  Pt was recently in the hospital 8/30-9/6 with septic shock from his wound.  Pt has h/o Parkinsonism, has had substantial weight loss [name] attributes to cancer.  Swallow eval ordered by Dr Neysa Bonito.   Assessment / Plan / Recommendation Clinical Impression  Pt presents with no focal CN deficits but does admit to some oral pocketing on the left after his right MCA CVA 1999.  He did NOT pass the Yale water test due to needing break after consuming approx 1.5 ounces and coughing within approx 20 seconds.  He has a baseline cough due to his pna but also suspect some component of dysphagia with his Parkinsonism.    Pt admits to occasional coughing "strangling" with food more than liquid prior to admission. No indication of aspiration  with applesauce and graham cracker boluses.  Pt did demonstrate pursed lip breathing after minimal intake - thus SLP ceased providing po.    Advised pt and [name] to SlP role of providing compensations/migitation strategies to increase comfort/decrease amount aspirated.    Reviewed 3 pillars of asp pna, importance of oral care, heimlich manuever and tips for eating/drinking with dyspnea. Pt able to use oral suction to clear secretions x1 - after SlP demonstrated its usage.  All education completed for maximal safety with po. No SlP follow up needed.  Thanks for this consult.  SLP Visit Diagnosis: Dysphagia, unspecified (R13.10)    Aspiration Risk  Moderate aspiration risk    Diet Recommendation Thin liquid (as tolerated - softer foods advised)   Liquid Administration via: Cup;Other (Comment) (use his bottle for liquids preferred) Medication Administration: Other (Comment) (as tolerated, with puree if problematic) Supervision: Comment (caregiver) Compensations: Slow rate;Small sips/bites Postural Changes: Seated  upright at 90 degrees;Remain upright for at least 30 minutes after po intake (as upright as much as able, ? reverse trendelenberg with bed)    Other  Recommendations Other Recommendations: Have oral suction available   Follow up Recommendations None      Frequency and Duration     n/a       Prognosis   n/a     Swallow Study   General Date of Onset: 06/20/20 HPI: 74 yo with h/o prostate cancer with bone mets on palliative treatment admitted to [facility] with respiratory failure with findings of heart failure and pna.  Pt was recently in the hospital 8/30-9/6 with septic shock from his wound.  Pt has h/o Parkinsonism, has had substantial weight loss [name] attributes to cancer.  Swallow eval ordered by Dr Neysa Bonito. Type of Study: Bedside Swallow Evaluation Previous Swallow Assessment: MBS in 2013, functional oropharyngeal swallow ability, concern for stasis in esophagus with  foods more than drinks with recommendations to follow food with liquids. Diet Prior to this Study: Regular;Thin liquids;Other (Comment) (carb mod) Temperature Spikes Noted: No Respiratory Status: Nasal cannula History of Recent Intubation: No Behavior/Cognition: Alert;Cooperative;Pleasant mood Oral Cavity Assessment: Excessive secretions;Other (comment) (mild viscous secretions retained in posterior oral cavity) Oral Care Completed by SLP: Other (Comment) (oral suction set up for pt and demonstrated its use to pt and his caregiver) Oral Cavity - Dentition: Dentures, top;Dentures, bottom Vision: Functional for self-feeding Self-Feeding Abilities: Needs assist;Other (Comment) (wife often feeds pt due this hemiparesis) Patient Positioning: Partially reclined;Other (comment) (upright as much as able given pt's discomfort from wound) Baseline Vocal Quality: Normal Volitional Cough: Other (Comment) (productive cough x1 during session to clear secretions) Volitional Swallow: Able to elicit    Oral/Motor/Sensory Function Overall Oral Motor/Sensory Function: Generalized oral weakness   Ice Chips Ice chips: Not tested   Thin Liquid Thin Liquid: Impaired Presentation: Straw;Cup;Self Fed Pharyngeal  Phase Impairments: Cough - Delayed Other Comments: Pt did not pass 3 ounce Yale water test due to requiring rest break approximately half way through.  Also displaying cough with water from straw - but not with use of his bottle requring suction. Suspect decrease amount of liquid bolus consumption may decrease his aspiration risk.    Nectar Thick Nectar Thick Liquid: Not tested   Honey Thick Honey Thick Liquid: Not tested   Puree Puree: Within functional limits Presentation: Spoon   Solid     Solid: Within functional limits      Macario Golds 06/20/2020,5:17 PM  Kathleen Lime, MS [place] Office [phone]

## 2020-06-20 NOTE — ED Notes (Signed)
Pt cleaned up and foley cath emptied.

## 2020-06-20 NOTE — Consult Note (Signed)
[place] Nurse Consult Note: Reason for Consult: Chronic, nonhealing wound of the left lower buttocks. Recent admission for this problem for which CCS was consulted. Antibiotics have contributed to fungal overgrowth in the bilateral inguinal, buttock and scrotal areas.  Wound type: Infectious Pressure Injury POA: N/A Measurement: Left lower buttock:  3cm x 2.4cm x 3cm with moist pink wound bed and serous exudate in a moderate amount. Scrotum: 0.8cm x 1cm with thin, soft yellow slough obscuring wound bed. Wound bed: As described above Drainage (amount, consistency, odor) As described above Periwound: erythematous with confluent center and satellite lesions consistent with fungal overgrowth  Dressing procedure/placement/frequency: I have reinstated orders for twice daily filling of the lower left buttock defect. I will provide the patient with a mattress replacement with low air loss feature and bilateral pressure redistribution heel boots. I have discontinued the antifungal powder as in the presence of moisture, it is caking and resulting in a paste. In its place, I have provided [facility], a compounded prescriptive consisting of 1:1:1 hydrocortisone, lotrimin and zinc oxide to the fungal-affected areas.  1. I suggest surgery be consulted for reevaluation of the wound and a decision on whether or not continuation of even twice weekly hydrotherapy is indicated. 2.  I recommend consideration of a systemic antifungal for prompt resolution of the fungal overgrowth. If you agree, please order.  [place] nursing team will not follow, but will remain available to this patient, the nursing and medical teams.  Please re-consult if needed. Thanks, Maudie Flakes, MSN, RN, [place], Arther Abbott  Pager# [phone]

## 2020-06-20 NOTE — Evaluation (Signed)
Physical Therapy Evaluation Patient Details Name: [name] MRN: [id number] DOB: 02-14-1946 Today's Date: 06/20/2020   History of Present Illness  74 yo with h/o prostate cancer with bone mets on palliative treatment admitted to [facility] 06/19/20 with respiratory failure with findings of heart failure and pna.  Pt was recently in the hospital 8/30-9/6 with septic shock from his perirectal abscess.  Pt has PMH significant for CAD, CABG, stroke with Lt sided weakness, Parkinsonism, and has had substantial weight loss.   Clinical Impression  [name] is 74 y.o. male admitted with above HPI and diagnosis. Patient is currently limited by functional impairments below (see PT problem list). Patient lives with his caregiver and PTA on 8/30 he was able to transfer to/from wheelchair with assist using rollator. Pt's caregiver reports he had been taking several small steps with rollatre and Lt LE brace to move from wheelchair to shower seat with assist. Patient eager to mobilize this date and requried Max Assist +2 to sit up EOB. 2HHA with Mod-Max assist provided for sit<>stand transfer x2 at EOB. Pt able to take small lateral steps but limited by bil LE buckling. VSS throughout session. Patient will benefit from continued skilled PT interventions to address impairments and progress independence with mobility, recommending pt return home with 24/7 assist from family, possibly home with hospice vs HH PT/RN. Acute PT will follow and progress as able.       06/20/20 1400  PT Visit Information  Last PT Received On 06/20/20  Assistance Needed +1  History of Present Illness 74 yo with h/o prostate cancer with bone mets on palliative treatment admitted to [facility] 06/19/20 with respiratory failure with findings of heart failure and pna.  Pt was recently in the hospital 8/30-9/6 with septic shock from his perirectal abscess.  Pt has PMH significant for CAD, CABG, stroke with Lt sided weakness, Parkinsonism, and has had  substantial weight loss.  Precautions  Precautions Fall  Precaution Comments hx of Lt hemiparesis; pt has AFO and special shoes for mobilizing  Restrictions  Weight Bearing Restrictions No  Home Living  Family/patient expects to be discharged to: Hospice/Palliative care  Living Arrangements Spouse/significant other  [place] - 4 wheels;Cane - single point;Shower seat;BSC;Hospital bed  Additional Comments Pt's caregiver reports since discharge home they had not been able to set up [facility] RN/PT and were not able to get wound care supplies from medical supply store. Also have not recieved low air loss mattress.   Prior Function  Level of Independence Needs assistance  Gait / Transfers Assistance Needed patient mainly uses rollator with right UE only. Was fairly independent with mobility prior to May per pt's caregiver/ex-wife. States he has required assist for stand step/pivot transfer with rollator and wheelchair. Reports he last was ambulatory ~ 06/10/20 when he took small steps in bathroom to move from shower seat to wheelchair with assist.  Comments used rollator for mobility (although does not fit in bathroom), independent dressing but assist with bathing, assist with IADLs   Communication  Communication No difficulties  Pain Assessment  Pain Assessment Faces  Faces Pain Scale 0  Pain Intervention(s) Monitored during session;Repositioned  Cognition  Arousal/Alertness Awake/alert  Behavior During Therapy [facility] for tasks assessed/performed;Flat affect  Overall Cognitive Status Within Functional Limits for tasks assessed  General Comments pt pleasant and oriented x3, he is motivated to move and very pleased to have stood up today.  Upper Extremity Assessment  Upper Extremity Assessment Defer to OT evaluation;LUE  deficits/detail  LUE Deficits / Details pt with limited ROM and no active grip.  Lower Extremity Assessment  Lower Extremity Assessment LLE deficits/detail;Generalized  weakness;RLE deficits/detail  RLE Deficits / Details 3/5 grossly with hip/knee/ankle testing in supine, strength functional for sit<>stand however poor endurance.   LLE Deficits / Details pt with some hip flexion activation, no extensor activation observed and 0/5 for ankle dorsi/plantar flexion  LLE Coordination decreased gross motor;decreased fine motor  Cervical / Trunk Assessment  Cervical / Trunk Assessment Kyphotic  Bed Mobility  Overal bed mobility Needs Assistance  Bed Mobility Supine to Sit;Sit to Supine  Supine to sit Max assist;+2 for safety/equipment;HOB elevated  Sit to supine +2 for physical assistance;+2 for safety/equipment;Total assist  General bed mobility comments pt attempting to initiate movement at [place] with hip for lower trunk rolling and reaching Rt UE to bed rail. max assist required for rolling upper trunk. bring LE's off EOB, and to raise trunk upright. Pt required Max +2 with use of bed pad to scoot to EOB.   Transfers  Overall transfer level Needs assistance  Equipment used 2 person hand held assist  Transfers Sit to/from Stand  Sit to Stand Mod assist;Max assist;+2 safety/equipment;+2 physical assistance;From elevated surface  General transfer comment bed elevated and 2HHA provided to rise and steady. Pt using Rt LE for power up and mod assist to rise. Max assist required to prevent LOB in stand after ~15 seconds due to muscle fatigue.  Ambulation/Gait  Ambulation/Gait assistance Max assist;+2 physical assistance;+2 safety/equipment  Assistive device 2 person hand held assist  General Gait Details pre-gait stepping at EOB, pt taking small lateral step to Lt with Lt LE and Rt LE. pt with buckling at Lt knee requiring blocking to prevent. After second step pt with buckling at Rt knee due to fatigue, Max +2 assist to maintain balance and control return to sit.   Balance  Overall balance assessment Needs assistance  Sitting-balance support Feet supported;Single  extremity supported  Sitting balance-Leahy Scale Poor  Sitting balance - Comments pt required min-max assist to steady at EOB and prevent LOB Rt/posterior.  Postural control Posterior lean;Right lateral lean  Standing balance support Bilateral upper extremity supported  Standing balance-Leahy Scale Zero  Standing balance comment Max assist with external support required for standing.  PT - End of Session  Equipment Utilized During Treatment Gait belt;Oxygen  Activity Tolerance Patient tolerated treatment well  Patient left in bed;with call bell/phone within reach  Nurse Communication Mobility status  PT Assessment  PT Recommendation/Assessment Patient needs continued PT services  PT Visit Diagnosis Difficulty in walking, not elsewhere classified (R26.2);Other abnormalities of gait and mobility (R26.89);Muscle weakness (generalized) (M62.81);History of falling (Z91.81)  PT Problem List Decreased strength;Decreased range of motion;Decreased activity tolerance;Decreased balance;Decreased mobility;Decreased knowledge of use of DME;Decreased safety awareness;Decreased knowledge of precautions;Impaired sensation;Decreased skin integrity  Barriers to Discharge Comments family is waiting on low air loss mattress to be delivered home and possilby a mechanical lift.   PT Plan  PT Frequency (ACUTE ONLY) Min 3X/week  PT Treatment/Interventions (ACUTE ONLY) DME instruction;Gait training;Functional mobility training;Therapeutic activities;Therapeutic exercise;Balance training;Patient/family education  AM-PAC PT "6 Clicks" Mobility Outcome Measure (Version 2)  Help needed turning from your back to your side while in a flat bed without using bedrails? 2  Help needed moving from lying on your back to sitting on the side of a flat bed without using bedrails? 1  Help needed moving to and from a bed to a chair (  including a wheelchair)? 1  Help needed standing up from a chair using your arms (e.g., wheelchair or  bedside chair)? 1  Help needed to walk in hospital room? 1  Help needed climbing 3-5 steps with a railing?  1  6 Click Score 7  Consider Recommendation of Discharge To: CIR/SNF/LTACH  PT Recommendation  Recommendations for Other Services OT consult  Follow Up Recommendations Home health PT;Supervision/Assistance - 24 hour  PT equipment Other (comment) (low air loss mattress, lift)  Individuals Consulted  Consulted and Agree with Results and Recommendations Patient;Family member/caregiver  Family Member Consulted pt's caregiver/ex-wife  Acute Rehab PT Goals  Patient Stated Goal be able to transfer bed to chair again  PT Goal Formulation With patient/family  Time For Goal Achievement 07/04/20  Potential to Achieve Goals Fair  PT Time Calculation  PT Start Time (ACUTE ONLY) 1448  PT Stop Time (ACUTE ONLY) 1520  PT Time Calculation (min) (ACUTE ONLY) 32 min  PT General Charges  $$ ACUTE PT VISIT 1 Visit  PT Evaluation  $PT Eval Moderate Complexity 1 Mod  PT Treatments  $Therapeutic Activity 8-22 mins  Written Expression  Dominant Hand Right    Verner Mould, DPT Acute Rehabilitation Services  Office [phone] Pager [phone]  06/20/2020 6:18 PM

## 2020-06-21 ENCOUNTER — Inpatient Hospital Stay (HOSPITAL_COMMUNITY): Payer: Medicare Other

## 2020-06-21 LAB — COMPREHENSIVE METABOLIC PANEL
ALT: 6 U/L (ref 0–44)
AST: 18 U/L (ref 15–41)
Albumin: 2.5 g/dL — ABNORMAL LOW (ref 3.5–5.0)
Alkaline Phosphatase: 66 U/L (ref 38–126)
Anion gap: 8 (ref 5–15)
BUN: 32 mg/dL — ABNORMAL HIGH (ref 8–23)
CO2: 19 mmol/L — ABNORMAL LOW (ref 22–32)
Calcium: 8.7 mg/dL — ABNORMAL LOW (ref 8.9–10.3)
Chloride: 106 mmol/L (ref 98–111)
Creatinine, Ser: 1.37 mg/dL — ABNORMAL HIGH (ref 0.61–1.24)
GFR calc Af Amer: 58 mL/min — ABNORMAL LOW (ref >60)
GFR calc non Af Amer: 50 mL/min — ABNORMAL LOW (ref >60)
Glucose, Bld: 119 mg/dL — ABNORMAL HIGH (ref 70–99)
Potassium: 4.5 mmol/L (ref 3.5–5.1)
Sodium: 133 mmol/L — ABNORMAL LOW (ref 135–145)
Total Bilirubin: 0.5 mg/dL (ref 0.3–1.2)
Total Protein: 5.7 g/dL — ABNORMAL LOW (ref 6.5–8.1)

## 2020-06-21 LAB — CBC WITH DIFFERENTIAL/PLATELET
Abs Immature Granulocytes: 0.08 10*3/uL — ABNORMAL HIGH (ref 0.00–0.07)
Basophils Absolute: 0 10*3/uL (ref 0.0–0.1)
Basophils Relative: 0 %
Eosinophils Absolute: 0.1 10*3/uL (ref 0.0–0.5)
Eosinophils Relative: 1 %
HCT: 29.4 % — ABNORMAL LOW (ref 39.0–52.0)
Hemoglobin: 9.3 g/dL — ABNORMAL LOW (ref 13.0–17.0)
Immature Granulocytes: 1 %
Lymphocytes Relative: 12 %
Lymphs Abs: 1.2 10*3/uL (ref 0.7–4.0)
MCH: 30.3 pg (ref 26.0–34.0)
MCHC: 31.6 g/dL (ref 30.0–36.0)
MCV: 95.8 fL (ref 80.0–100.0)
Monocytes Absolute: 1.1 10*3/uL — ABNORMAL HIGH (ref 0.1–1.0)
Monocytes Relative: 11 %
Neutro Abs: 7.4 10*3/uL (ref 1.7–7.7)
Neutrophils Relative %: 75 %
Platelets: 275 10*3/uL (ref 150–400)
RBC: 3.07 MIL/uL — ABNORMAL LOW (ref 4.22–5.81)
RDW: 21.2 % — ABNORMAL HIGH (ref 11.5–15.5)
WBC: 10 10*3/uL (ref 4.0–10.5)
nRBC: 0 % (ref 0.0–0.2)

## 2020-06-21 LAB — GLUCOSE, CAPILLARY
Glucose-Capillary: 119 mg/dL — ABNORMAL HIGH (ref 70–99)
Glucose-Capillary: 133 mg/dL — ABNORMAL HIGH (ref 70–99)
Glucose-Capillary: 144 mg/dL — ABNORMAL HIGH (ref 70–99)
Glucose-Capillary: 152 mg/dL — ABNORMAL HIGH (ref 70–99)

## 2020-06-21 MED ORDER — COLLAGENASE 250 UNIT/GM EX OINT
TOPICAL_OINTMENT | Freq: Two times a day (BID) | CUTANEOUS | Status: DC
  Administered 2020-06-22 – 2020-06-25 (×3): 1 via TOPICAL
  Filled 2020-06-21 (×2): qty 30

## 2020-06-21 NOTE — Consult Note (Signed)
[facility] Nurse wound follow up Patient seen yesterday in ED.  Request received to continue collagenase (Santyl) until wound 100% free of nonviable tissue. Order modified.  Patient is likely to transfer to SNF instead of home due to caregiver burden.  [facility] nursing team will not follow, but will remain available to this patient, the nursing and medical teams.  Please re-consult if needed. Thanks, Maudie Flakes, MSN, RN, [place], Arther Abbott  Pager# [phone]

## 2020-06-21 NOTE — Progress Notes (Signed)
Home O2 saturations:  Patient Saturations on Room Air 93%. SRP, RN

## 2020-06-21 NOTE — Progress Notes (Addendum)
PROGRESS NOTE    [name]  TDH:[id number] DOB: 05-Feb-1946 DOA: 06/19/2020 PCP: [name], MD  Brief Narrative:  74 year old white male CABG + PAD with resulting HFpEF (EF 64-68% grade 2 diastolic dysfunction), HTN Prior rectal abscess with recent hospitalization for septic shock New (04/2020) prostate cancer with mets to the bone followed by Dr. Alen Blew--- very debilitated after last hospital stay DM TY 2 Parkinson's disease, prior CVA, seizure disorder CKD 3-3 B baseline creatinine 1.9  Hospitalization 8/30 through 06/18/2020 with septic shock in addition to DKA secondary to perirectal abscess-received hydrotherapy-General surgery consulted did not feel surgical debridement was recommended-received broad-spectrum antibiotics tailored to Augmentin on discharge for 10 more days of Augmentin and was to follow-up with wound care center on discharge Also was supposed to follow-up with urology to commence androgen deprivation therapy  Discharged to the care of his ex-wife who is his caretaker-had developed a cough in the past week in addition to a rash in the groin Also had 5 loose stools In emergency room sodium 132 potassium 5.5 WBC 12.8 BNP 2077 CXR?  Multifocal pneumonia Started on vancomycin and cefepime C. difficile tested but negative  Due to uncertainty of diagnosis-two-view x-ray was repeated that confirmed pneumonia  Assessment & Plan:   Principal Problem:   Respiratory failure ([place]) Active Problems:   Seizure disorder (HCC)   Dyslipidemia, goal LDL below 70   Parkinson's disease ([place])   Essential hypertension   History of stroke   Abscess of buttock   Hyponatremia   Acute on chronic diastolic CHF (congestive heart failure) ([place])   Type 2 diabetes mellitus without complication (HCC)   Multifocal pneumonia   Candidiasis of scrotum   1. Sepsis possibly healthcare associated pneumonia but differential could include abscess of perirectal area a. Continue empiric  coverage with vancomycin azithromycin and cefepime-narrow in the next 24 hours to oral aspiration coverage with Augmentin or clindamycin b. Repeat 2 view CXR 9/9 confirmed pneumonia c. Wound probed and examined on 9/8-is improving-outpatient [facility] care at [facility] will need enzymatic debridement will order--overlay mattress in addition to Prevalon d. Foley catheter was changed 8/30-he might be colonized urine culture from 9/7 in process e. Urine culture multiple species therefore disregard  2. C. difficile ruled out 3. HFpEF acute decompensated on admission a. BNP elevated probably secondary to heart failure but could be confounded by with infection b. Lasix home dosing is 20 mg, I will continue IV 40 mg daily and watch creatinine c. -2.4 L weight down from 74 to 71 kg d. Holding Benicar 20 daily e. May resume amlodipine 10 4. Hyperkalemia on admission 5. Mild metabolic acidosis probably combination of sepsis and AKI superimposed CKD 2 a. Holding ARB as above given hyperkalemia which is improved now b. CO2 is variable-continue to monitor 6. New diagnosis of prostate cancer 04/2020 a. We will CC Dr. Alen Blew b. Continue Rapaflo 8 mg every morning c. His wife indicates to me that they would be interested in palliative care services only and not hospice because they wish to continue androgen deprivation therapy and so I have explained to her that going to skilled will not obviate that and I have asked that social work speak to them regarding this 7. DM TY 2 with recent DKA a. Blood sugars ranging 1 19-1 91 b. Currently on Lantus 10 units (home dose 16) c. Sugars are well controlled 8. CVA, seizure a. Continue statins continue Aggrenox once daily 9. Parkinson's a. Can continue carbidopa 2 tablets  3 times a day b. Monitor 10. Normocytic anemia with component of anemia from malignancy a. Outpatient management b. Hemoglobin is stable in the 9 range  DVT prophylaxis: Lovenox Code Status:  Full code Family Communication: Discussed with ex-wife at the bedside in person Disposition: Inpatient  Status is: Inpatient  Remains inpatient appropriate because:Hemodynamically unstable, Persistent severe electrolyte disturbances, Unsafe d/c plan and IV treatments appropriate due to intensity of illness or inability to take PO   Dispo: The patient is from: Home              Anticipated d/c is to: Unclear at this time              Anticipated d/c date is: 2 days              Patient currently is not medically stable to d/c.       Consultants:   None at this time  Procedures: No  Antimicrobials: As above   Subjective: Pleasant seems improved no chest pain Coughing up green sputum Therapy was looking at the wound and he was able to mobilize but their verbal report to me is that he would benefit from daily continue therapy Patient is interested in skilled short-term   Objective: Vitals:   06/20/20 2141 06/21/20 0021 06/21/20 0550 06/21/20 0657  BP: 121/63  (!) 138/53   Pulse: 75  69   Resp: (!) 22 16 18    Temp: 98.6 F (37 C)  98.7 F (37.1 C)   TempSrc: Oral  Oral   SpO2: 96%  97%   Weight:    71.2 kg  Height:        Intake/Output Summary (Last 24 hours) at 06/21/2020 1149 Last data filed at 06/21/2020 0545 Gross per 24 hour  Intake 860.28 ml  Output 1300 ml  Net -439.72 ml   Filed Weights   06/20/20 1700 06/21/20 0657  Weight: 74.8 kg 71.2 kg    Examination:  General exam: EOMI NCAT flat affect on oxygen Respiratory system: Clear no added sound no rales no rhonchi-no fremitus resonance appreciated Cardiovascular system: S1-S2 no murmur rub or gallop-paced rhythm on monitors Gastrointestinal system: Abdomen soft no rebound no guarding Foley in place.  Central nervous system: Flexion contractures to left upper extremity-rest as per prior ROM is grossly intact but patient is quite weak Extremities: As above Psychiatry: Flat affect  Data Reviewed: I  have personally reviewed following labs and imaging studies  Sodium down from 1 32-1 31 Potassium down from 5.5-4.5 CO2 18-->19 BUNs/creatinine 29/1.3-->29/1.31-->32/1.3 White count 12.8-->12.1-->10.0 Hemoglobin 9.9-->9.3 Platelet 328  Radiology Studies: DG Chest 2 View  Result Date: 06/21/2020 CLINICAL DATA:  Pneumonia.  Shortness of breath. EXAM: CHEST - 2 VIEW COMPARISON:  06/19/2020 FINDINGS: Sequelae of CABG are again identified. The cardiomediastinal silhouette is unchanged with normal heart size. Right mid lung and bilateral lower lung airspace opacities are unchanged. There are small bilateral pleural effusions. No pneumothorax is identified. IMPRESSION: Unchanged bilateral airspace disease which could reflect pneumonia or edema with small pleural effusions. Electronically Signed   By: Logan Bores M.D.   On: 06/21/2020 09:18   DG Chest Portable 1 View  Result Date: 06/19/2020 CLINICAL DATA:  Shortness of breath, concern for pneumonia. Productive cough. EXAM: PORTABLE CHEST 1 VIEW COMPARISON:  Chest x-ray 06/11/2020. FINDINGS: Interval removal of a right internal jugular central venous catheter. Vascular surgical clips overlying the mediastinum. Intact sternotomy wires are again identified. The heart size is unchanged. Silhouetting off of  the right and left cardiac borders as well as the medial left hemidiaphragm due to pulmonary findings. Bilateral mid to lower lung zone patchy airspace opacity. Interval development of retrocardiac opacity. No acute osseous finding. IMPRESSION: Interval development of multifocal pneumonia. Electronically Signed   By: Iven Finn M.D.   On: 06/19/2020 13:08     Scheduled Meds: . amLODipine  10 mg Oral Daily  . azithromycin  250 mg Oral Daily  . carbidopa-levodopa  2 tablet Oral TID  . dantrolene  50 mg Oral BID  . dipyridamole-aspirin  1 capsule Oral BID  . enoxaparin (LOVENOX) injection  40 mg Subcutaneous Q24H  . ezetimibe  10 mg Oral Daily    . fenofibrate  54 mg Oral Daily  . Gerhardt's butt cream   Topical TID  . insulin aspart  0-9 Units Subcutaneous TID WC  . insulin glargine  10 Units Subcutaneous QHS  . levETIRAcetam  500 mg Oral BID  . metoprolol tartrate  25 mg Oral Daily  . rosuvastatin  20 mg Oral Daily  . saccharomyces boulardii  250 mg Oral BID  . sodium chloride flush  3 mL Intravenous Q12H  . tamsulosin  0.4 mg Oral Daily   Continuous Infusions: . ceFEPime (MAXIPIME) IV 2 g (06/21/20 0026)  . vancomycin 750 mg (06/21/20 0126)     LOS: 2 days    Time spent: [place], MD [facility] To contact the attending provider between 7A-7P or the covering provider during after hours 7P-7A, please log into the web site [email or web address] and access using universal  password for that web site. If you do not have the password, please call the hospital operator.  06/21/2020, 11:49 AM

## 2020-06-21 NOTE — Plan of Care (Signed)
  Problem: Education: Goal: Knowledge of General Education information will improve Description Including pain rating scale, medication(s)/side effects and non-pharmacologic comfort measures Outcome: Progressing   Problem: Health Behavior/Discharge Planning: Goal: Ability to manage health-related needs will improve Outcome: Progressing   

## 2020-06-21 NOTE — Progress Notes (Signed)
Physical Therapy Treatment Patient Details Name: [name] MRN: [id number] DOB: [birth year] Today's Date: 06/21/2020    History of Present Illness 74 yo with h/o prostate cancer with bone mets on palliative treatment admitted to [facility] 06/19/20 with respiratory failure with findings of heart failure and pna.  Pt was recently in the hospital 8/30-9/6 with septic shock from his perirectal abscess.  Pt has PMH significant for CAD, CABG, stroke with Lt sided weakness, Parkinsonism, and has had substantial weight loss.    PT Comments    Patient progressing slowly with therapy but remains motivated to participate. Pt continues to require Max assist for bed mobility and Max +2 for sit<>stand transfers. Pt unable to take small lateral steps today due to LE buckling and unable to transfer to [facility]. Pt complete sit<>stand 4x and sat EOB on bedpan for BM. Pt required assist for rolling and dressing removal due to soiled bandage related to BM. RN notified and arrived to change dressing. Acute PT will continue to progress as able.    Follow Up Recommendations  Supervision/Assistance - 24 hour;SNF (SNF vs HHPT (home with hospice))     Equipment Recommendations  Other (comment) (low air loss mattress, lift)    Recommendations for Other Services OT consult     Precautions / Restrictions Precautions Precautions: Fall Precaution Comments: hx of Lt hemiparesis; pt has AFO and special shoes for mobilizing Restrictions Weight Bearing Restrictions: No    Mobility  Bed Mobility Overal bed mobility: Needs Assistance Bed Mobility: Supine to Sit;Sit to Supine;Rolling Rolling: Mod assist;Max assist;+2 for safety/equipment   Supine to sit: Max assist;+2 for safety/equipment;HOB elevated Sit to supine: +2 for physical assistance;+2 for safety/equipment;Total assist   General bed mobility comments: Mod assist to roll to Lt side as pt able to initiate rolling with Rt UE/LE; Max to roll to Rt due Lt hemiparesis.  Max/Total assist to rasie trunk and bring LE off EOB as well as to return to supine and boost up in bed to reposition.  Transfers Overall transfer level: Needs assistance Equipment used: 2 person hand held assist Transfers: Sit to/from Stand Sit to Stand: Mod assist;Max assist;+2 safety/equipment;+2 physical assistance;From elevated surface         General transfer comment: bed elevated and 2HHA provided to rise and steady. Pt using Rt LE for power up and mod assist to rise. Max assist required to prevent LOB in standing. Pt with bil LE's buckling after ~10 seconds. Pt performed 4x from EOB, unable to step laterally to transfer to [facility] today.   Ambulation/Gait Ambulation/Gait assistance: Max assist;+2 physical assistance;+2 safety/equipment   Assistive device: 2 person hand held assist       General Gait Details: pt attempted lateral stepping but unable to step due to contralateral LE buckling.    Stairs             Wheelchair Mobility    Modified Rankin (Stroke Patients Only)       Balance Overall balance assessment: Needs assistance Sitting-balance support: Feet supported;Single extremity supported Sitting balance-Leahy Scale: Poor Sitting balance - Comments: pt using bed rail for support. pt required min-mod assist for balance with UE support. Max without UE support. Postural control: Posterior lean;Right lateral lean Standing balance support: Bilateral upper extremity supported Standing balance-Leahy Scale: Zero Standing balance comment: Max assist with external support required for standing.           Cognition Arousal/Alertness: Awake/alert Behavior During Therapy: WFL for tasks assessed/performed;Flat affect Overall Cognitive Status:  Within Functional Limits for tasks assessed            General Comments: pt with flat affect but continues to be pleasant and oriented x3, he is motivated to move and very pleased to have stood up today.       Exercises      General Comments        Pertinent Vitals/Pain Pain Assessment: No/denies pain Faces Pain Scale: No hurt Pain Intervention(s): Monitored during session;Repositioned           PT Goals (current goals can now be found in the care plan section) Acute Rehab PT Goals Patient Stated Goal: be able to transfer bed to chair again PT Goal Formulation: With patient/family Time For Goal Achievement: 07/04/20 Potential to Achieve Goals: Fair Progress towards PT goals: Progressing toward goals (slow)    Frequency    Min 3X/week      PT Plan Discharge plan needs to be updated       AM-PAC PT "6 Clicks" Mobility   Outcome Measure  Help needed turning from your back to your side while in a flat bed without using bedrails?: A Lot Help needed moving from lying on your back to sitting on the side of a flat bed without using bedrails?: Total Help needed moving to and from a bed to a chair (including a wheelchair)?: Total Help needed standing up from a chair using your arms (e.g., wheelchair or bedside chair)?: Total Help needed to walk in hospital room?: Total Help needed climbing 3-5 steps with a railing? : Total 6 Click Score: 7    End of Session Equipment Utilized During Treatment: Gait belt;Oxygen Activity Tolerance: Patient tolerated treatment well Patient left: in bed;with call bell/phone within reach Nurse Communication: Mobility status PT Visit Diagnosis: Difficulty in walking, not elsewhere classified (R26.2);Other abnormalities of gait and mobility (R26.89);Muscle weakness (generalized) (M62.81);History of falling (Z91.81)     Time: 5885-0277 PT Time Calculation (min) (ACUTE ONLY): 43 min  Charges:  $Therapeutic Activity: 38-52 mins                    Verner Mould, DPT Acute Rehabilitation Services  Office [phone] Pager [phone]  06/21/2020 12:37 PM

## 2020-06-22 LAB — COMPREHENSIVE METABOLIC PANEL
ALT: 8 U/L (ref 0–44)
AST: 17 U/L (ref 15–41)
Albumin: 2.8 g/dL — ABNORMAL LOW (ref 3.5–5.0)
Alkaline Phosphatase: 65 U/L (ref 38–126)
Anion gap: 13 (ref 5–15)
BUN: 34 mg/dL — ABNORMAL HIGH (ref 8–23)
CO2: 18 mmol/L — ABNORMAL LOW (ref 22–32)
Calcium: 9.2 mg/dL (ref 8.9–10.3)
Chloride: 104 mmol/L (ref 98–111)
Creatinine, Ser: 1.24 mg/dL (ref 0.61–1.24)
GFR calc Af Amer: >60 mL/min (ref >60)
GFR calc non Af Amer: 56 mL/min — ABNORMAL LOW (ref >60)
Glucose, Bld: 104 mg/dL — ABNORMAL HIGH (ref 70–99)
Potassium: 4.5 mmol/L (ref 3.5–5.1)
Sodium: 135 mmol/L (ref 135–145)
Total Bilirubin: 0.5 mg/dL (ref 0.3–1.2)
Total Protein: 6.3 g/dL — ABNORMAL LOW (ref 6.5–8.1)

## 2020-06-22 LAB — CBC WITH DIFFERENTIAL/PLATELET
Abs Immature Granulocytes: 0.04 10*3/uL (ref 0.00–0.07)
Basophils Absolute: 0.1 10*3/uL (ref 0.0–0.1)
Basophils Relative: 1 %
Eosinophils Absolute: 0.1 10*3/uL (ref 0.0–0.5)
Eosinophils Relative: 1 %
HCT: 32.4 % — ABNORMAL LOW (ref 39.0–52.0)
Hemoglobin: 10 g/dL — ABNORMAL LOW (ref 13.0–17.0)
Immature Granulocytes: 0 %
Lymphocytes Relative: 11 %
Lymphs Abs: 1.2 10*3/uL (ref 0.7–4.0)
MCH: 29.5 pg (ref 26.0–34.0)
MCHC: 30.9 g/dL (ref 30.0–36.0)
MCV: 95.6 fL (ref 80.0–100.0)
Monocytes Absolute: 1 10*3/uL (ref 0.1–1.0)
Monocytes Relative: 10 %
Neutro Abs: 8.3 10*3/uL — ABNORMAL HIGH (ref 1.7–7.7)
Neutrophils Relative %: 77 %
Platelets: 289 10*3/uL (ref 150–400)
RBC: 3.39 MIL/uL — ABNORMAL LOW (ref 4.22–5.81)
RDW: 21 % — ABNORMAL HIGH (ref 11.5–15.5)
WBC: 10.7 10*3/uL — ABNORMAL HIGH (ref 4.0–10.5)
nRBC: 0 % (ref 0.0–0.2)

## 2020-06-22 LAB — GLUCOSE, CAPILLARY
Glucose-Capillary: 97 mg/dL (ref 70–99)
Glucose-Capillary: 117 mg/dL — ABNORMAL HIGH (ref 70–99)
Glucose-Capillary: 126 mg/dL — ABNORMAL HIGH (ref 70–99)
Glucose-Capillary: 132 mg/dL — ABNORMAL HIGH (ref 70–99)

## 2020-06-22 MED ORDER — DM-GUAIFENESIN ER 30-600 MG PO TB12
2.0000 | ORAL_TABLET | Freq: Two times a day (BID) | ORAL | Status: DC
  Administered 2020-06-22 – 2020-06-27 (×8): 2 via ORAL
  Filled 2020-06-22 (×7): qty 2
  Filled 2020-06-22: qty 1

## 2020-06-22 MED ORDER — AMOXICILLIN-POT CLAVULANATE 875-125 MG PO TABS
1.0000 | ORAL_TABLET | Freq: Two times a day (BID) | ORAL | Status: DC
  Administered 2020-06-22 – 2020-06-27 (×10): 1 via ORAL
  Filled 2020-06-22 (×11): qty 1

## 2020-06-22 MED ORDER — FUROSEMIDE 20 MG PO TABS
20.0000 mg | ORAL_TABLET | Freq: Every day | ORAL | Status: DC
  Administered 2020-06-22 – 2020-06-23 (×2): 20 mg via ORAL
  Filled 2020-06-22 (×2): qty 1

## 2020-06-22 NOTE — NC FL2 (Signed)
[place] LEVEL OF CARE SCREENING TOOL     IDENTIFICATION  Patient Name: [name] Birthdate: [birth year] Sex: male Admission Date (Current Location): 06/19/2020  [facility] and [place] Number:  Herbalist and Address:  [facility],  [place] [address], [place]      Provider Number: [id number]  Attending Physician Name and Address:  Nita Sells, MD  Relative Name and Phone Number:  Bowman Higbie [phone]    Current Level of Care: Hospital Recommended Level of Care: [place] Prior Approval Number:    Date Approved/Denied:   PASRR Number: [id number]  Discharge Plan: SNF    Current Diagnoses: Patient Active Problem List   Diagnosis Date Noted  . Multifocal pneumonia 06/19/2020  . Respiratory failure ([place]) 06/19/2020  . Candidiasis of scrotum 06/19/2020  . Type 2 diabetes mellitus without complication ([place]) 34/74/2595  . Prostate cancer metastatic to bone ([place]) 06/14/2020  . Acute renal failure superimposed on stage 3a chronic kidney disease ([place]) 06/14/2020  . Acute on chronic diastolic CHF (congestive heart failure) ([place]) 06/14/2020  . Pressure ulcer of buttock, unstageable ([place]) 06/12/2020  . History of bladder cancer 08/26/2019  . Abscess of buttock 08/26/2019  . Severe sepsis with septic shock ([place]) 08/26/2019  . Hyponatremia 08/26/2019  . Normocytic anemia 08/26/2019  . Hemoptysis 08/26/2019  . Hip pain, left 08/26/2019  . CHF exacerbation ([place]) 08/26/2019  . CRI (chronic renal insufficiency), stage 3 (moderate) 04/21/2019  . History of stroke 08/18/2018  . Essential hypertension 09/25/2015  . Essential tremor 04/19/2015  . Parkinson's disease ([place]) 06/28/2014  . Hx of CABG 03/24/2014  . Dyslipidemia, goal LDL below 70 03/24/2014  . Seizure disorder ([place]) 03/08/2013  . Spastic hemiplegia affecting nondominant side ([place]) 03/29/2012  . Carotid stenosis 10/20/2011    Orientation  RESPIRATION BLADDER Height & Weight     Self, Time, Situation, Place  Normal Incontinent Weight:  (patient has speciality bed, weight cannot be obtained now) Height:  5\' 4"  (162.6 cm)  BEHAVIORAL SYMPTOMS/MOOD NEUROLOGICAL BOWEL NUTRITION STATUS      Incontinent Diet (Carb Modified)  AMBULATORY STATUS COMMUNICATION OF NEEDS Skin   Extensive Assist Verbally PU Stage and Appropriate Care (Buttocks) PU Stage 1 Dressing: QID                     Personal Care Assistance Level of Assistance  Bathing, Feeding, Dressing Bathing Assistance: Maximum assistance Feeding assistance: Limited assistance Dressing Assistance: Maximum assistance     Functional Limitations Info  Sight, Hearing, Speech Sight Info: Impaired Hearing Info: Impaired Speech Info: Adequate    SPECIAL CARE FACTORS FREQUENCY  PT (By licensed PT), OT (By licensed OT)     PT Frequency: 5x week OT Frequency: 5x week            Contractures Contractures Info: Not present    Additional Factors Info  Code Status, Allergies Code Status Info: FULL Allergies Info: Altace Ramipril           Current Medications (06/22/2020):  This is the current hospital active medication list Current Facility-Administered Medications  Medication Dose Route Frequency Provider Last Rate Last Admin  . acetaminophen (TYLENOL) tablet 650 mg  650 mg Oral Q6H PRN Harold Hedge, MD       Or  . acetaminophen (TYLENOL) suppository 650 mg  650 mg Rectal Q6H PRN Harold Hedge, MD      . amLODipine (NORVASC) tablet 10 mg  10  mg Oral Daily Harold Hedge, MD   10 mg at 06/22/20 1001  . amoxicillin-clavulanate (AUGMENTIN) 875-125 MG per tablet 1 tablet  1 tablet Oral Q12H Nita Sells, MD   1 tablet at 06/22/20 0959  . carbidopa-levodopa (SINEMET IR) 25-100 MG per tablet immediate release 2 tablet  2 tablet Oral TID Harold Hedge, MD   2 tablet at 06/22/20 1001  . collagenase (SANTYL) ointment   Topical BID Nita Sells, MD    Given at 06/22/20 1009  . dantrolene (DANTRIUM) capsule 50 mg  50 mg Oral BID Harold Hedge, MD   50 mg at 06/22/20 1002  . dextromethorphan-guaiFENesin (MUCINEX DM) 30-600 MG per 12 hr tablet 2 tablet  2 tablet Oral BID Nita Sells, MD      . dipyridamole-aspirin (AGGRENOX) 200-25 MG per 12 hr capsule 1 capsule  1 capsule Oral BID Harold Hedge, MD   1 capsule at 06/22/20 1000  . enoxaparin (LOVENOX) injection 40 mg  40 mg Subcutaneous Q24H Harold Hedge, MD   40 mg at 06/21/20 2103  . ezetimibe (ZETIA) tablet 10 mg  10 mg Oral Daily Harold Hedge, MD   10 mg at 06/22/20 1000  . fenofibrate tablet 54 mg  54 mg Oral Daily Harold Hedge, MD   54 mg at 06/22/20 1002  . furosemide (LASIX) tablet 20 mg  20 mg Oral Daily Nita Sells, MD   20 mg at 06/22/20 0959  . Gerhardt's butt cream   Topical TID Nita Sells, MD   Given at 06/22/20 1010  . insulin aspart (novoLOG) injection 0-9 Units  0-9 Units Subcutaneous TID WC Harold Hedge, MD   1 Units at 06/21/20 1757  . insulin glargine (LANTUS) injection 10 Units  10 Units Subcutaneous QHS Harold Hedge, MD   10 Units at 06/21/20 2102  . levETIRAcetam (KEPPRA) tablet 500 mg  500 mg Oral BID Harold Hedge, MD   500 mg at 06/22/20 1001  . metoprolol tartrate (LOPRESSOR) injection 5 mg  5 mg Intravenous Q6H PRN Harold Hedge, MD      . metoprolol tartrate (LOPRESSOR) tablet 25 mg  25 mg Oral Daily Harold Hedge, MD   25 mg at 06/22/20 1001  . rosuvastatin (CRESTOR) tablet 20 mg  20 mg Oral Daily Harold Hedge, MD   20 mg at 06/22/20 1001  . saccharomyces boulardii (FLORASTOR) capsule 250 mg  250 mg Oral BID Harold Hedge, MD   250 mg at 06/22/20 1003  . sodium chloride flush (NS) 0.9 % injection 3 mL  3 mL Intravenous Q12H Harold Hedge, MD   3 mL at 06/22/20 1015  . tamsulosin (FLOMAX) capsule 0.4 mg  0.4 mg Oral Daily Harold Hedge, MD   0.4 mg at 06/22/20 1002     Discharge Medications: Please see discharge  summary for a list of discharge medications.  Relevant Imaging Results:  Relevant Lab Results:   Additional Information [id number]  Purcell Mouton, RN

## 2020-06-22 NOTE — Progress Notes (Signed)
Physical Therapy Treatment Patient Details Name: [name] MRN: [id number] DOB: [birth year] Today's Date: 06/22/2020    History of Present Illness 74 yo with h/o prostate cancer with bone mets on palliative treatment admitted to [facility] 06/19/20 with respiratory failure with findings of heart failure and pna.  Pt was recently in the hospital 8/30-9/6 with septic shock from his perirectal abscess.  Pt has PMH significant for CAD, CABG, stroke with Lt sided weakness, Parkinsonism, and has had substantial weight loss.    PT Comments    Patient making good progress with mobility, now requiring Mod assist for rolling. Pt continues to require Max-Total assist for supine<>sit transfers. He demonstrated improved use of Rt UE to steady self at EOB and initiated repeated sit<>stands for strengthening; seated rest breaks required throughout. Pt also instructed on LE strengthening in bed for bil LE's. Family now agreeable to SNF for rehab follow up. Acute will continue to progress as able.     Follow Up Recommendations  Supervision/Assistance - 24 hour;SNF     Equipment Recommendations  Other (comment) (low air loss mattress, lift)    Recommendations for Other Services OT consult     Precautions / Restrictions Precautions Precautions: Fall Precaution Comments: hx of Lt hemiparesis; pt has AFO and special shoes for mobilizing Restrictions Weight Bearing Restrictions: No    Mobility  Bed Mobility Overal bed mobility: Needs Assistance Bed Mobility: Supine to Sit;Sit to Supine;Rolling Rolling: Mod assist;+2 for safety/equipment   Supine to sit: Max assist;+2 for safety/equipment;HOB elevated Sit to supine: +2 for physical assistance;+2 for safety/equipment;Total assist   General bed mobility comments: Pt able to initaite Lt hip flexion with min assist and use Rt hand to move Lt UE to position for Rt rolling. Mod assist to complete roll. Max assist for sidelying to sit with bringing LE's off  EOB and raise trunk. Pt with improved balance today sitting EOB. Max-Total assist to return to supine.   Transfers Overall transfer level: Needs assistance Equipment used: 2 person hand held assist Transfers: Sit to/from Stand Sit to Stand: Mod assist;Max assist;+2 safety/equipment;+2 physical assistance;From elevated surface         General transfer comment: bed deflated to improved pt's ability to rise from EOB. Pt required mod-max assist to rise from EOB with 2HHA. Patient performed multiple reps for mobility.   Ambulation/Gait      Stairs          Wheelchair Mobility    Modified Rankin (Stroke Patients Only)       Balance Overall balance assessment: Needs assistance Sitting-balance support: Feet supported;Single extremity supported Sitting balance-Leahy Scale: Poor Sitting balance - Comments: pt with improved balance using Rt UE for support, min guard to sit EOB. Postural control: Posterior lean;Right lateral lean Standing balance support: Bilateral upper extremity supported Standing balance-Leahy Scale: Zero Standing balance comment: Max assist with external support required for standing.           Cognition Arousal/Alertness: Awake/alert Behavior During Therapy: WFL for tasks assessed/performed;Flat affect Overall Cognitive Status: Within Functional Limits for tasks assessed            General Comments: pt with flat affect but continues to be pleasant and oriented x3, he remains motivated and eager to work with therapy.      Exercises General Exercises - Lower Extremity Quad Sets: Left;5 reps;Supine;AROM Heel Slides: AROM;Left;5 reps;Supine;AAROM Straight Leg Raises: AROM;Right;10 reps;Supine Other Exercises Other Exercises: 3x3 reps Sit<>Stand EOB, with 2HHA.     General Comments  Pertinent Vitals/Pain Pain Assessment: No/denies pain Faces Pain Scale: No hurt Pain Intervention(s): Monitored during session;Repositioned            PT Goals (current goals can now be found in the care plan section) Acute Rehab PT Goals Patient Stated Goal: be able to transfer bed to chair again PT Goal Formulation: With patient/family Time For Goal Achievement: 07/04/20 Potential to Achieve Goals: Fair Progress towards PT goals: Progressing toward goals    Frequency    Min 3X/week      PT Plan Discharge plan needs to be updated    Co-evaluation              AM-PAC PT "6 Clicks" Mobility   Outcome Measure  Help needed turning from your back to your side while in a flat bed without using bedrails?: A Lot Help needed moving from lying on your back to sitting on the side of a flat bed without using bedrails?: Total Help needed moving to and from a bed to a chair (including a wheelchair)?: Total Help needed standing up from a chair using your arms (e.g., wheelchair or bedside chair)?: Total Help needed to walk in hospital room?: Total Help needed climbing 3-5 steps with a railing? : Total 6 Click Score: 7    End of Session Equipment Utilized During Treatment: Gait belt;Oxygen Activity Tolerance: Patient tolerated treatment well Patient left: in bed;with call bell/phone within reach Nurse Communication: Mobility status PT Visit Diagnosis: Difficulty in walking, not elsewhere classified (R26.2);Other abnormalities of gait and mobility (R26.89);Muscle weakness (generalized) (M62.81);History of falling (Z91.81)     Time: 1610-9604 PT Time Calculation (min) (ACUTE ONLY): 28 min  Charges:  $Therapeutic Exercise: 8-22 mins $Therapeutic Activity: 8-22 mins                     Verner Mould, DPT Acute Rehabilitation Services  Office [phone] Pager [phone]  06/22/2020 4:21 PM

## 2020-06-22 NOTE — TOC Progression Note (Signed)
Transition of Care [facility]) - Progression Note    Patient Details  Name: [name] MRN: [id number] Date of Birth: 27-Aug-1946  [facility]) CM/SW Contact  Purcell Mouton, RN Phone Number: 06/22/2020, 3:30 PM  Clinical Narrative:    Spoke with pt's wife [name] at bedside. Wife is not sure that she wants pt to go to SNF related to her fears of COVID and Cdiff. Pt was active with [facility] for HHPT only. Explained to pt's wife that when the pt is faxed to SNF, a TOC team member will give her a list of bed offers. Pt was faxed to SNF. Spoke with Alvis Lemmings, it was revealed that pt is beyond home health care and will not be able to service him at home.         Expected Discharge Plan and Services                                                 Social Determinants of Health (SDOH) Interventions    Readmission Risk Interventions No flowsheet data found.

## 2020-06-22 NOTE — Progress Notes (Signed)
PROGRESS NOTE    [name]  ZMO:[id number] DOB: 04/09/1946 DOA: 06/19/2020 PCP: [name], MD  Brief Narrative:  74 year old white male CABG + PAD with resulting HFpEF (EF 03-54% grade 2 diastolic dysfunction), HTN Prior rectal abscess with recent hospitalization for septic shock New (04/2020) prostate cancer with mets to the bone followed by Dr. Alen Blew--- very debilitated after last hospital stay DM TY 2 Parkinson's disease, prior CVA, seizure disorder CKD 3-3 B baseline creatinine 1.9  Hospitalization 8/30 through 06/18/2020 with septic shock in addition to DKA secondary to perirectal abscess-received hydrotherapy-General surgery consulted did not feel surgical debridement was recommended-received broad-spectrum antibiotics tailored to Augmentin on discharge for 10 more days of Augmentin and was to follow-up with wound care center on discharge Also was supposed to follow-up with urology to commence androgen deprivation therapy  Discharged to the care of his ex-wife who is his caretaker-had developed a cough in the past week in addition to a rash in the groin Also had 5 loose stools In emergency room sodium 132 potassium 5.5 WBC 12.8 BNP 2077 CXR?  Multifocal pneumonia Started on vancomycin and cefepime C. difficile tested but negative  Due to uncertainty of diagnosis-two-view x-ray was repeated that confirmed pneumonia  Assessment & Plan:   Principal Problem:   Respiratory failure ([place]) Active Problems:   Seizure disorder (HCC)   Dyslipidemia, goal LDL below 70   Parkinson's disease ([place])   Essential hypertension   History of stroke   Abscess of buttock   Hyponatremia   Acute on chronic diastolic CHF (congestive heart failure) ([place])   Type 2 diabetes mellitus without complication (HCC)   Multifocal pneumonia   Candidiasis of scrotum   1. Sepsis possibly healthcare associated pneumonia-abscess of perirectal area ruled out a. Received vancomycin azithromycin and  cefepime-narrowed 9/10 Augmentin b. Repeat 2 view CXR 9/9 confirmed pneumonia c. Foley catheter was changed 8/30-and growing multiple colonies-would not regard this as infection 2. C. difficile ruled out 3. Chronic diarrhea with tunneling wound from perirectal abscess a. Wound examined earlier this admission -probed and examined on 9/8-is improving-outpatient wound care at [facility] will need enzymatic debridement will order--overlay mattress in addition to Prevalon b. Enzymatic debridement with collagenase twice daily-dressings with saline dampened gauze 4. HFpEF acute decompensated on admission a. BNP elevated likely secondary to infection b. Lasix home dosing is 20 mg and this was resumed in addition to amlodipine 10, metoprolol 25 c. - -3.5 L weight down to 71 kg d. Holding Benicar 20 daily and would discontinue on discharge 5. Hyperkalemia on admission 6. Mild metabolic acidosis probably combination of sepsis and AKI superimposed CKD 2 a. Holding ARB as above given hyperkalemia which is improved now b. CO2 is variable-continue to monitor c. Add sodium bicarb as outpatient if no better 7. New diagnosis of prostate cancer 04/2020 a. We will CC Dr. Alen Blew b. Continue Rapaflo 8 mg every morning/Flomax 0.4 c. His wife indicates to me that they would be interested in palliative care services only and not hospice because they wish to continue androgen deprivation therapy and so I have explained to her that going to skilled will not obviate that and I have asked that social work speak to them regarding this 8. DM TY 2 with recent DKA a. CBGs are well controlled 1 17-1 26 eating almost 100% of meals b. Currently on Lantus 10 units (home dose 16) 9. CVA, seizure in the past with need for AFOs and gait instability a. Continue statins, fibrate  continue Aggrenox twice daily b. Continue Keppra 500 twice daily c. Needs likely skilled placement although family may refuse and TOC is working on  this 10. Parkinson's a. Can continue carbidopa 2 tablets 3 times a day b. Monitor 11. Normocytic anemia with component of anemia from malignancy a. Outpatient management b. Hemoglobin is stable in the 9 range  DVT prophylaxis: Lovenox Code Status: Full code Family Communication: Discussed with ex-wife at the bedside in person Disposition: Inpatient  Status is: Inpatient  Remains inpatient appropriate because:Hemodynamically unstable, Persistent severe electrolyte disturbances, Unsafe d/c plan and IV treatments appropriate due to intensity of illness or inability to take PO   Dispo: The patient is from: Home              Anticipated d/c is to: Unclear at this time              Anticipated d/c date is: 2 days              Patient currently is not medically stable to d/c.       Consultants:   None at this time  Procedures: No  Antimicrobials: As above   Subjective: Much improved much more coherent Has had a cough today more so than prior Long discussion with her regarding his multifactorial dyspnea   Objective: Vitals:   06/21/20 2051 06/21/20 2348 06/22/20 0533 06/22/20 1242  BP: (!) 130/56  (!) 141/62 133/63  Pulse: 79  82 75  Resp: 20  19 17   Temp: 98.8 F (37.1 C)  98.5 F (36.9 C) 98.8 F (37.1 C)  TempSrc: Oral  Oral Oral  SpO2: 96% 91% 90% 91%  Weight:      Height:        Intake/Output Summary (Last 24 hours) at 06/22/2020 1743 Last data filed at 06/22/2020 1543 Gross per 24 hour  Intake 243 ml  Output 575 ml  Net -332 ml   Filed Weights   06/20/20 1700 06/21/20 0657  Weight: 74.8 kg 71.2 kg    Examination:  General exam: EOMI NCAT much more awake alert coherent Decreased air entry left side compared to right S1-S2 no murmur midline scar Abdomen soft no rebound no guarding Neurologically intact moving all 4 extremities  Data Reviewed: I have personally reviewed following labs and imaging studies  Sodium 135 Potassium 4.5 CO2  18 BUNs/creatinine 29/1.3-->29/1.31-->32/1.3-->34/1.2 White count 12.8-->12.1-->10.7 Hemoglobin 10.0  Radiology Studies: DG Chest 2 View  Result Date: 06/21/2020 CLINICAL DATA:  Pneumonia.  Shortness of breath. EXAM: CHEST - 2 VIEW COMPARISON:  06/19/2020 FINDINGS: Sequelae of CABG are again identified. The cardiomediastinal silhouette is unchanged with normal heart size. Right mid lung and bilateral lower lung airspace opacities are unchanged. There are small bilateral pleural effusions. No pneumothorax is identified. IMPRESSION: Unchanged bilateral airspace disease which could reflect pneumonia or edema with small pleural effusions. Electronically Signed   By: Logan Bores M.D.   On: 06/21/2020 09:18     Scheduled Meds: . amLODipine  10 mg Oral Daily  . amoxicillin-clavulanate  1 tablet Oral Q12H  . carbidopa-levodopa  2 tablet Oral TID  . collagenase   Topical BID  . dantrolene  50 mg Oral BID  . dextromethorphan-guaiFENesin  2 tablet Oral BID  . dipyridamole-aspirin  1 capsule Oral BID  . enoxaparin (LOVENOX) injection  40 mg Subcutaneous Q24H  . ezetimibe  10 mg Oral Daily  . fenofibrate  54 mg Oral Daily  . furosemide  20 mg Oral Daily  .  Gerhardt's butt cream   Topical TID  . insulin aspart  0-9 Units Subcutaneous TID WC  . insulin glargine  10 Units Subcutaneous QHS  . levETIRAcetam  500 mg Oral BID  . metoprolol tartrate  25 mg Oral Daily  . rosuvastatin  20 mg Oral Daily  . saccharomyces boulardii  250 mg Oral BID  . sodium chloride flush  3 mL Intravenous Q12H  . tamsulosin  0.4 mg Oral Daily   Continuous Infusions:    LOS: 3 days    Time spent: [place], MD [facility] To contact the attending provider between 7A-7P or the covering provider during after hours 7P-7A, please log into the web site [email or web address] and access using universal [place] password for that web site. If you do not have the password, please call the hospital  operator.  06/22/2020, 5:43 PM

## 2020-06-22 NOTE — Progress Notes (Signed)
30 Day Passar Note  RE:  [id number]       Date of Birth: 05/07/1946    Date: 06/22/2020       To Whom It May Concern:  Please be advised that the above-named patient will require a short-term nursing home stay - anticipated 30 days or less for rehabilitation and strengthening.  The plan is for return home.   Purcell Mouton, RN, BSN [phone]   Verneita Griffes, MD [facility] 3:51 PM  MD signature: _________________________

## 2020-06-22 NOTE — Care Management Important Message (Signed)
Important Message  Patient Details IM Letter given to the Patient Name: [name] MRN: [id number] Date of Birth: [birth year]   Medicare Important Message Given:  Yes     Kerin Salen 06/22/2020, 11:47 AM

## 2020-06-23 ENCOUNTER — Inpatient Hospital Stay (HOSPITAL_COMMUNITY): Payer: Medicare Other

## 2020-06-23 ENCOUNTER — Other Ambulatory Visit: Payer: Self-pay | Admitting: Physical Medicine & Rehabilitation

## 2020-06-23 LAB — CBC WITH DIFFERENTIAL/PLATELET
Abs Immature Granulocytes: 0.04 10*3/uL (ref 0.00–0.07)
Basophils Absolute: 0.1 10*3/uL (ref 0.0–0.1)
Basophils Relative: 1 %
Eosinophils Absolute: 0.1 10*3/uL (ref 0.0–0.5)
Eosinophils Relative: 1 %
HCT: 30.3 % — ABNORMAL LOW (ref 39.0–52.0)
Hemoglobin: 9.3 g/dL — ABNORMAL LOW (ref 13.0–17.0)
Immature Granulocytes: 0 %
Lymphocytes Relative: 10 %
Lymphs Abs: 0.9 10*3/uL (ref 0.7–4.0)
MCH: 29.6 pg (ref 26.0–34.0)
MCHC: 30.7 g/dL (ref 30.0–36.0)
MCV: 96.5 fL (ref 80.0–100.0)
Monocytes Absolute: 0.8 10*3/uL (ref 0.1–1.0)
Monocytes Relative: 9 %
Neutro Abs: 7.4 10*3/uL (ref 1.7–7.7)
Neutrophils Relative %: 79 %
Platelets: 275 10*3/uL (ref 150–400)
RBC: 3.14 MIL/uL — ABNORMAL LOW (ref 4.22–5.81)
RDW: 21 % — ABNORMAL HIGH (ref 11.5–15.5)
WBC: 9.3 10*3/uL (ref 4.0–10.5)
nRBC: 0 % (ref 0.0–0.2)

## 2020-06-23 LAB — GLUCOSE, CAPILLARY
Glucose-Capillary: 98 mg/dL (ref 70–99)
Glucose-Capillary: 113 mg/dL — ABNORMAL HIGH (ref 70–99)
Glucose-Capillary: 179 mg/dL — ABNORMAL HIGH (ref 70–99)
Glucose-Capillary: 122 mg/dL — ABNORMAL HIGH (ref 70–99)

## 2020-06-23 LAB — COMPREHENSIVE METABOLIC PANEL
ALT: 7 U/L (ref 0–44)
AST: 16 U/L (ref 15–41)
Albumin: 2.8 g/dL — ABNORMAL LOW (ref 3.5–5.0)
Alkaline Phosphatase: 68 U/L (ref 38–126)
Anion gap: 15 (ref 5–15)
BUN: 33 mg/dL — ABNORMAL HIGH (ref 8–23)
CO2: 19 mmol/L — ABNORMAL LOW (ref 22–32)
Calcium: 9.2 mg/dL (ref 8.9–10.3)
Chloride: 103 mmol/L (ref 98–111)
Creatinine, Ser: 1.16 mg/dL (ref 0.61–1.24)
GFR calc Af Amer: >60 mL/min (ref >60)
GFR calc non Af Amer: >60 mL/min (ref >60)
Glucose, Bld: 108 mg/dL — ABNORMAL HIGH (ref 70–99)
Potassium: 4.5 mmol/L (ref 3.5–5.1)
Sodium: 137 mmol/L (ref 135–145)
Total Bilirubin: 0.7 mg/dL (ref 0.3–1.2)
Total Protein: 6.4 g/dL — ABNORMAL LOW (ref 6.5–8.1)

## 2020-06-23 MED ORDER — ONDANSETRON HCL 4 MG/2ML IJ SOLN
4.0000 mg | Freq: Three times a day (TID) | INTRAMUSCULAR | Status: DC | PRN
  Administered 2020-06-24: 4 mg via INTRAVENOUS
  Filled 2020-06-23: qty 2

## 2020-06-23 NOTE — TOC Progression Note (Signed)
Transition of Care [facility]) - Progression Note    Patient Details  Name: [name] MRN: [id number] Date of Birth: March 28, 1946  [facility] of [facility]) CM/SW Contact  Purcell Mouton, RN Phone Number: 06/23/2020, 12:29 PM  Clinical Narrative:    SNF Bed offers given to pt's wife.        Expected Discharge Plan and Services                                                 Social Determinants of Health (SDOH) Interventions    Readmission Risk Interventions No flowsheet data found.

## 2020-06-23 NOTE — Progress Notes (Signed)
Pt tolerating DYS I diet and PO meds without nausea or vomiting at present time. Spouse at bedside with pt. Pt taking in sips of water without issues. Will cont to monitor. SRP, RN

## 2020-06-23 NOTE — Evaluation (Signed)
Clinical/Bedside Swallow Evaluation Patient Details  Name: JOSEMARIA BRINING MRN: [id number] Date of Birth: 12-Jan-1946  Today's Date: 06/23/2020 Time: SLP Start Time (ACUTE ONLY): 1145 SLP Stop Time (ACUTE ONLY): 1200 SLP Time Calculation (min) (ACUTE ONLY): 15 min  Past Medical History:  Past Medical History:  Diagnosis Date  . At high risk for falls   . Bladder cancer [facility])    urologist-  dr Gaynelle Arabian  . CAD (coronary artery disease) cardiologist-  dr berry  . Cerebrovascular arteriosclerosis   . Gait disturbance, post-stroke    uses cane, rollator, and w/c long distance  . Hemiparkinsonism [facility]) neurologist-- dr Leta Baptist   right side body  . History of basal cell carcinoma excision    Sept 2016--  MOH's surgery side of nose  . History of carotid artery stenosis cardiologist-- dr berry   bilateral --  1999 s/p  bilateral ICA endarterectomy and recurrent restenosis 2012  s/p  staged bilateral stenting   per last duplex 2015  stents widely patent  . History of CVA with residual deficit neurologist-  dr Leta Baptist   3/ 1999  Right MCA  and  12/ 2004  Anterior division of  Right MCA ---  residual left spactic hemiparesis and left foot drop  . History of MI (myocardial infarction)    02/ 2004   s/p  cabg   . HOH (hard of hearing)    LEFT EAR  POST cva  . Hyperextension deformity of left knee    wears brace  . Hyperlipidemia   . Hypertension   . Left foot drop    residual from CVA  -- wears brace  . Left spastic hemiparesis ([place])    residual CVA 1999  . Pancreatic pseudocyst   . Peripheral arterial disease ([place])   . S/P CABG x 4    02/ 2004  . Seizure disorder [facility]) last seizure 2011 due to confusion   neurologist-  dr Leta Baptist-- per note seizure documented 2008 breakthrough partial complex seizure (prior tonic-clonic seizure's post CVA)  . Type 2 diabetes, diet controlled ([place])     2  . Visual neglect    LEFT EYE   Past Surgical History:  Past Surgical History:   Procedure Laterality Date  . CARDIAC CATHETERIZATION  11/17/2002   significant left main disease and 3 vessel CAD, mildly depressed LV systolic  fx, 24% left renal artery stenosis  . CAROTID ENDARTERECTOMY  12/1997   [facility])   Bilateral ICA  . CAROTID STENT INSERTION Bilateral dr berry and dr Kristine Royal--  (In-stent Restenosis)  LeftICA   02-12-2011;  [place]   02-26-2011  . CORONARY ARTERY BYPASS GRAFT  2/ 2004   dr hendrickson   x3 LIMA to LAD,free right internal mammary artery to obtuse marginal 1,SVG to distal right coronary.  . CYSTOSCOPY W/ RETROGRADES Bilateral 09/10/2015   Procedure: CYSTOSCOPY WITH RETROGRADE PYELOGRAM;  Surgeon: Carolan Clines, MD;  Location[facility];  Service: Urology;  Laterality: Bilateral;  . CYSTOSCOPY WITH BIOPSY N/A 09/10/2015   Procedure: CYSTOSCOPY WITH BIOPSY,RIGHT TRIGONAL TUMOR 1.5 CM, EXCISIONAL BIOPSY WITH TAUBER FORCEP RIGHT POSTERIOR BLADDER WALL 1 CM, EXCISIONAL BIOPSY SATELITE BLADDER WALL 1 CM;  Surgeon: Carolan Clines, MD;  Location: [facility];  Service: Urology;  Laterality: N/A;  . CYSTOSCOPY WITH BIOPSY N/A 03/26/2017   Procedure: CYSTOSCOPY WITH BIOPSY/;  Surgeon: Carolan Clines, MD;  Location: [facility];  Service: Urology;  Laterality: N/A;  . CYSTOSCOPY WITH HYDRODISTENSION AND BIOPSY  N/A 10/29/2015   Procedure: REPEAT CYSTOSCOPY BIOPSY BLADDER DEEP MUSCLES BLADDER BIOPSY;  Surgeon: Carolan Clines, MD;  Location: [facility];  Service: Urology;  Laterality: N/A;  . CYSTOSCOPY WITH URETHRAL DILATATION N/A 09/10/2015   Procedure: CYSTOSCOPY WITH URETHRAL DILATATION;  Surgeon: Carolan Clines, MD;  Location: [place];  Service: Urology;  Laterality: N/A;  . FULGURATION OF BLADDER TUMOR N/A 03/26/2017   Procedure: FULGURATION OF BLADDER TUMOR;  Surgeon: Carolan Clines, MD;  Location: [facility];  Service: Urology;  Laterality: N/A;  . [place]  . MOHS SURGERY  06-20-2015   side of nose  . NM MYOCAR PERF WALL MOTION  01/09/2011   dr berry   mild to mod. perfusion defect in the basal inferolateral & mid inferolaterl regions consistant with an infarct/scar, no signigicant ischemia demonstracted/  Low Risk scan, no sig. change from previous study/  normal LV function and wall motion , ef 63%  . ORIF RIGHT ANKLE FX  1998   hardware retained  . TRANSURETHRAL RESECTION OF BLADDER TUMOR N/A 09/10/2015   Procedure: CYSTO TRANSURETHRAL RESECTION OF BLADDER TUMOR (TURBT) OF LEFT POSTERIOR BLADDER TUMOR 2 CM;  Surgeon: Carolan Clines, MD;  Location: [facility];  Service: Urology;  Laterality: N/A;  . TRANSURETHRAL RESECTION OF BLADDER TUMOR Left 04/17/2016   Procedure: TRANSURETHRAL RESECTION OF BLADDER TUMOR (TURBT);  Surgeon: Carolan Clines, MD;  Location: [facility];  Service: Urology;  Laterality: Left;  . US ECHOCARDIOGRAPHY  01/03/2011   normal LVF, ef>55%/  mild LAE/ trace MR and TR,  mild AV sclerosis without stenosis   HPI:  74 yo with h/o prostate cancer with bone mets on palliative treatment admitted to [facility] with respiratory failure with findings of heart failure and pna.  Pt was recently in the hospital 8/30-9/6 with septic shock from his wound.  Pt has h/o Parkinsonism, has had substantial weight loss [name] attributes to cancer.  Repeat swallow evaluation ordered this date due to vomitting.     Assessment / Plan / Recommendation Clinical Impression  Repeat clinical swallowing evaluation was completed using thin liquids via spoon, cup and straw, pureed material and dry solids.  The patient's caretaker that was present reported that the patient vomitted x 2 this morning.  She reported previous history of issues swallowing related to a remote CVA but that he was eating/drinking fine at home recently.  Cranial nerve exam was completed and remarkable for mildly decreased lingual, labial and facial  range of motion and strength on the left.  Reduced facial movement on the left was noted in both the upper and lower face and he appeared to have some issues keeping his left eye tightly shut for longer periods of time.  Facial sensation was grossly intact but he did report decreased sensation on the left side of his face.  His oral and pharygeal swallow appeared adequate.  Mastication of dry solids was adequate with no oral residue seen post swallow.  Swallow trigger was appreciated to palpation.  Overt s/s of aspiration were not seen even given straw sips of thin liquids.  Suggest he resume a regular diet with thin liquids.  Allow medication whole with liquids unless he is having issues.  ST will follow briefly for therapeutic diet tolerance.   SLP Visit Diagnosis: Dysphagia, unspecified (R13.10)    Aspiration Risk  Mild aspiration risk    Diet Recommendation   Regular with thin liquids  Medication Administration: Whole meds with liquid (  consider whole in pureed if issues noted)    Other  Recommendations Oral Care Recommendations: Oral care BID   Follow up Recommendations None      Frequency and Duration min 2x/week  2 weeks       Prognosis Prognosis for Safe Diet Advancement: Good      Swallow Study   General Date of Onset: 06/19/20 HPI: 74 yo with h/o prostate cancer with bone mets on palliative treatment admitted to [facility] with respiratory failure with findings of heart failure and pna.  Pt was recently in the hospital 8/30-9/6 with septic shock from his wound.  Pt has h/o Parkinsonism, has had substantial weight loss [name] attributes to cancer.  Repeat swallow evaluation ordered this date due to vomitting.   Type of Study: Bedside Swallow Evaluation Previous Swallow Assessment: CSE this admission and MBS in 2013, functional oropharyngeal swallow ability, concern for stasis in esophagus with foods more than drinks with recommendations to follow food with liquids. Diet Prior to  this Study: Dysphagia 1 (puree);Thin liquids Temperature Spikes Noted: No Respiratory Status: Room air History of Recent Intubation: No Behavior/Cognition: Alert;Cooperative;Pleasant mood Oral Cavity Assessment: Within Functional Limits Oral Care Completed by SLP: No Oral Cavity - Dentition: Dentures, top;Dentures, bottom Vision: Functional for self-feeding Self-Feeding Abilities: Total assist Patient Positioning: Upright in bed Baseline Vocal Quality: Normal Volitional Swallow: Able to elicit    Oral/Motor/Sensory Function Overall Oral Motor/Sensory Function: Mild impairment Facial ROM: Reduced left Facial Symmetry: Abnormal symmetry left Facial Strength: Within Functional Limits Facial Sensation: Reduced left Lingual ROM: Reduced left Lingual Symmetry: Abnormal symmetry left Lingual Strength: Reduced Mandible: Within Functional Limits   Ice Chips Ice chips: Not tested   Thin Liquid Thin Liquid: Within functional limits Presentation: Cup;Spoon;Straw    Nectar Thick Nectar Thick Liquid: Not tested   Honey Thick Honey Thick Liquid: Not tested   Puree Puree: Within functional limits Presentation: Spoon   Solid     Solid: Within functional limits Presentation: Louanne Belton, [place], [place] [facility] SLP [phone]  Lamar Sprinkles 06/23/2020,12:14 PM

## 2020-06-23 NOTE — Progress Notes (Signed)
Pt had episode of vomiting, suctioned pt. Wife at bedside feeding pt. Pt began to cough and start to choke on small bites while eating. MD updated. O2 unchanged on RA, airway cleared and pt remains stable. Will cont to monitor. SRP, RN

## 2020-06-23 NOTE — Progress Notes (Signed)
PROGRESS NOTE    [name]  JME:[id number] DOB: 1946-06-10 DOA: 06/19/2020 PCP: [name], MD  Brief Narrative:  74 year old white male CABG + PAD with resulting HFpEF (EF 22-97% grade 2 diastolic dysfunction), HTN Prior rectal abscess with recent hospitalization for septic shock New (04/2020) prostate cancer with mets to the bone followed by Dr. Alen Blew--- very debilitated after last hospital stay DM TY 2 Parkinson's disease, prior CVA, seizure disorder CKD 3-3 B baseline creatinine 1.9  Hospitalization 8/30 through 06/18/2020 with septic shock in addition to DKA secondary to perirectal abscess-received hydrotherapy-General surgery consulted did not feel surgical debridement was recommended-received broad-spectrum antibiotics tailored to Augmentin on discharge for 10 more days of Augmentin and was to follow-up with wound care center on discharge Also was supposed to follow-up with urology to commence androgen deprivation therapy  Discharged to the care of his ex-wife who is his caretaker-had developed a cough in the past week in addition to a rash in the groin Also had 5 loose stools In emergency room sodium 132 potassium 5.5 WBC 12.8 BNP 2077 CXR?  Multifocal pneumonia Started on vancomycin and cefepime C. difficile tested but negative  Due to uncertainty of diagnosis-two-view x-ray was repeated that confirmed pneumonia  Assessment & Plan:   Principal Problem:   Respiratory failure ([place]) Active Problems:   Seizure disorder (HCC)   Dyslipidemia, goal LDL below 70   Parkinson's disease ([place])   Essential hypertension   History of stroke   Abscess of buttock   Hyponatremia   Acute on chronic diastolic CHF (congestive heart failure) ([place])   Type 2 diabetes mellitus without complication (HCC)   Multifocal pneumonia   Candidiasis of scrotum   1. Sepsis 2/2 aspiration pneumonia 2. -abscess of perirectal area ruled out by exam 3. Chronic indwelling Foley is  colonized-does not have urinary infection a. Received vancomycin azithromycin and cefepime-narrowed 9/10 Augmentin b. Repeat 2 view CXR 9/9 confirmed pneumonia i. Vomit X2 on 9/11-post tussive with gagging-certain consistencies are difficult for him to swallow at times ii. Will obtain acute abdominal series in addition to 2 view x-ray stat to rule out obstruction although this is much less likely iii. Speech reengaged for diet order and patient downgraded to dysphagia 1 diet-appreciate their input iv. Minimize polypharmacy and nonessential meds attempted to give a.m. meds again at 4 PM 4. C. difficile ruled out 5. Chronic diarrhea with tunneling wound from perirectal abscess a. Reexamined wound 9/11 is improving-outpatient wound care at [facility] will need enzymatic debridement will order--overlay mattress in addition to Prevalon b. Enzymatic debridement with collagenase twice daily-dressings with saline dampened gauze c. Further management as per wound care 6. HFpEF acute decompensated on admission a. BNP elevated likely secondary to infection b. Lasix temporarily held 9/11 c. Continue metoprolol 25 d. -4.2 L e. Holding Benicar 20 daily and would discontinue on discharge 7. Hyperkalemia on admission 8. Mild metabolic acidosis probably combination of sepsis and AKI superimposed CKD 2 a. Holding ARB 2/2 hyperkalemia  b. CO2 is variable-continue to monitor c. Add sodium bicarb as outpatient if no better 9. New diagnosis of prostate cancer 04/2020 a. We will CC Dr. Alen Blew b. Continue Rapaflo 8 mg every morning/Flomax 0.4 c. His wife indicates to me that they would be interested in palliative care services only and not hospice because they wish to continue androgen deprivation therapy and so I have explained to her that going to skilled will not obviate that and I have asked that social work speak  to them regarding this 10. DM TY 2 with recent DKA a. Dysphagia 1 diet currently given vomiting  with cough b. CBG 98-132 c. Currently on Lantus 10 units (home dose 16) 11. CVA, seizure in the past with need for AFOs and gait instability a. Temporarily hold statins fibrate given vomiting b. Continue Aggrenox twice daily c. Continue Keppra 500 twice daily p.o. but change to IV 12. Parkinson's a. Can continue carbidopa 2 tablets 3 times a day b. Monitor 13. Normocytic anemia with component of anemia from malignancy a. Outpatient management b. Hemoglobin is stable in the 9 range  DVT prophylaxis: Lovenox Code Status: Full code Family Communication: Long discussion bedside with family Disposition: Inpatient  Status is: Inpatient  Remains inpatient appropriate because:Hemodynamically unstable, Persistent severe electrolyte disturbances, Unsafe d/c plan and IV treatments appropriate due to intensity of illness or inability to take PO   Dispo: The patient is from: Home              Anticipated d/c is to: Unclear at this time              Anticipated d/c date is: 2 days              Patient currently is not medically stable to d/c.       Consultants:   None at this time  Procedures: No  Antimicrobials: As above   Subjective:  Episode of posttussive emesis 6 AM and 11 AM with meds He seems awake alert coherent despite this No increased work of breathing Tells me that he has occasional difficulty with various textures of food such as tilapia Usually takes meds alone however large pill this morning with applesauce caused him to gag according to family No current chest pain    Objective: Vitals:   06/22/20 1242 06/22/20 2109 06/23/20 0600 06/23/20 0913  BP: 133/63 (!) 140/59 (!) 149/62 (!) 152/69  Pulse: 75 74 84 85  Resp: 17 20 16  (!) 22  Temp: 98.8 F (37.1 C) 98.2 F (36.8 C) 98.2 F (36.8 C) 98.4 F (36.9 C)  TempSrc: Oral Oral Oral Oral  SpO2: 91% 92% 92% 91%  Weight:      Height:        Intake/Output Summary (Last 24 hours) at 06/23/2020 1122 Last  data filed at 06/22/2020 2110 Gross per 24 hour  Intake 240 ml  Output 700 ml  Net -460 ml   Filed Weights   06/20/20 1700 06/21/20 0657  Weight: 74.8 kg 71.2 kg    Examination:  General exam: EOMI NCAT much more awake alert coherent Significantly decreased air entry right posterior lung fields no rhonchi no rales S1-S2  midline scar noted on chest holosystolic murmur Abdomen has an upper xiphisternal scar consistent with this CAD/CABG history-he has no tenderness whatsoever his abdomen is not distended bowel sounds are heard Neurologically moving extremities however is limited on the left side secondary to power deficit from prior stroke Patient has AFOs on Wound examination as below and picture compared to prior 1 done 9/8     Data Reviewed: I have personally reviewed following labs and imaging studies  Sodium 135 Potassium 4.5 CO2 18 BUNs/creatinine 29/1.3-->29/1.31-->32/1.3-->34/1.2 White count 12.8-->12.1-->10.7 Hemoglobin 10.0  Radiology Studies: No results found.   Scheduled Meds: . amLODipine  10 mg Oral Daily  . amoxicillin-clavulanate  1 tablet Oral Q12H  . carbidopa-levodopa  2 tablet Oral TID  . collagenase   Topical BID  . dantrolene  50 mg  Oral BID  . dextromethorphan-guaiFENesin  2 tablet Oral BID  . dipyridamole-aspirin  1 capsule Oral BID  . enoxaparin (LOVENOX) injection  40 mg Subcutaneous Q24H  . Gerhardt's butt cream   Topical TID  . insulin aspart  0-9 Units Subcutaneous TID WC  . insulin glargine  10 Units Subcutaneous QHS  . levETIRAcetam  500 mg Oral BID  . metoprolol tartrate  25 mg Oral Daily  . sodium chloride flush  3 mL Intravenous Q12H  . tamsulosin  0.4 mg Oral Daily   Continuous Infusions:    LOS: 4 days    Time spent: Naomi, MD [facility] To contact the attending provider between 7A-7P or the covering provider during after hours 7P-7A, please log into the web site [email or web address] and access using  universal [place] password for that web site. If you do not have the password, please call the hospital operator.  06/23/2020, 11:22 AM

## 2020-06-24 ENCOUNTER — Inpatient Hospital Stay (HOSPITAL_COMMUNITY): Payer: Medicare Other

## 2020-06-24 LAB — CULTURE, BLOOD (ROUTINE X 2)
Culture: NO GROWTH
Culture: NO GROWTH

## 2020-06-24 LAB — GLUCOSE, CAPILLARY
Glucose-Capillary: 112 mg/dL — ABNORMAL HIGH (ref 70–99)
Glucose-Capillary: 126 mg/dL — ABNORMAL HIGH (ref 70–99)
Glucose-Capillary: 115 mg/dL — ABNORMAL HIGH (ref 70–99)
Glucose-Capillary: 110 mg/dL — ABNORMAL HIGH (ref 70–99)

## 2020-06-24 LAB — BASIC METABOLIC PANEL
Anion gap: 11 (ref 5–15)
BUN: 27 mg/dL — ABNORMAL HIGH (ref 8–23)
CO2: 21 mmol/L — ABNORMAL LOW (ref 22–32)
Calcium: 9.5 mg/dL (ref 8.9–10.3)
Chloride: 103 mmol/L (ref 98–111)
Creatinine, Ser: 1.03 mg/dL (ref 0.61–1.24)
GFR calc Af Amer: >60 mL/min (ref >60)
GFR calc non Af Amer: >60 mL/min (ref >60)
Glucose, Bld: 122 mg/dL — ABNORMAL HIGH (ref 70–99)
Potassium: 4.4 mmol/L (ref 3.5–5.1)
Sodium: 135 mmol/L (ref 135–145)

## 2020-06-24 MED ORDER — ONDANSETRON HCL 4 MG/2ML IJ SOLN
4.0000 mg | Freq: Four times a day (QID) | INTRAMUSCULAR | Status: DC | PRN
  Administered 2020-06-24 – 2020-06-26 (×5): 4 mg via INTRAVENOUS
  Filled 2020-06-24 (×6): qty 2

## 2020-06-24 MED ORDER — IOHEXOL 9 MG/ML PO SOLN
500.0000 mL | ORAL | Status: AC
  Administered 2020-06-24: 500 mL via ORAL

## 2020-06-24 NOTE — Progress Notes (Signed)
PROGRESS NOTE    [name]  KZL:[id number] DOB: 1945/12/12 DOA: 06/19/2020 PCP: [name], MD  Brief Narrative:  74 year old white male CABG + PAD with resulting HFpEF (EF 39-03% grade 2 diastolic dysfunction), HTN Prior rectal abscess with recent hospitalization for septic shock New (04/2020) prostate cancer with mets to the bone followed by Dr. Alen Blew--- very debilitated after last hospital stay DM TY 2 Parkinson's disease, prior CVA, seizure disorder CKD 3-3 B baseline creatinine 1.9  Hospitalization 8/30 through 06/18/2020 with septic shock in addition to DKA secondary to perirectal abscess-received hydrotherapy-General surgery consulted did not feel surgical debridement was recommended-received broad-spectrum antibiotics tailored to Augmentin on discharge for 10 more days of Augmentin and was to follow-up with wound care center on discharge Also was supposed to follow-up with urology to commence androgen deprivation therapy  Discharged to the care of his ex-wife who is his caretaker-had developed a cough in the past week in addition to a rash in the groin Also had 5 loose stools In emergency room sodium 132 potassium 5.5 WBC 12.8 BNP 2077 CXR?  Multifocal pneumonia Started on vancomycin and cefepime C. difficile tested but negative  Due to uncertainty of diagnosis-two-view x-ray was repeated that confirmed pneumonia Developed ileus and some vomiting on 9/11-CT scan of the abdomen pelvis is pending to rule out obstruction  Assessment & Plan:   Principal Problem:   Respiratory failure ([place]) Active Problems:   Seizure disorder (HCC)   Dyslipidemia, goal LDL below 70   Parkinson's disease ([place])   Essential hypertension   History of stroke   Abscess of buttock   Hyponatremia   Acute on chronic diastolic CHF (congestive heart failure) ([place])   Type 2 diabetes mellitus without complication ([place])   Multifocal pneumonia   Candidiasis of scrotum   1. Sepsis 2/2  aspiration pneumonia 2. Abscess of perirectal area ruled out by exam 3. Chronic indwelling Foley is colonized-does not have urinary infection a. Received vancomycin azithromycin and cefepime-narrowed 9/10 Augmentin b. Repeat 2 view CXR 9/9 confirmed pneumonia i. Vomit X2 on 9/11-post tussive with gagging-certain consistencies are difficult for him to swallow at times ii. Had a AXR 9/11? Ileus--not eating much per wife--r/o SBO with CT scan today--can continue diet but if further vomit, only clears iii. Continue dysphagia 1 diet iv. Minimize polypharmacy to reduce pill burden 4. C. difficile ruled out 5. Chronic diarrhea with tunneling wound from perirectal abscess a. Reexamined wound 9/11 is improving-outpatient wound care at [facility] continue enzymatic debridement overlay mattress twice daily saline dressings b. Further management as per wound care 6. HFpEF acute decompensated on admission a. BNP elevated likely secondary to infection b. Lasix temporarily held 9/11 c. Continue metoprolol 25 d. -6.0 L weight 70 kg e. Minus 4 kg 7. Hyperkalemia on admission 8. Mild metabolic acidosis probably combination of sepsis and AKI superimposed CKD 2 a. Holding ARB 2/2 hyperkalemia  b. CO2 is variable-continue to monitor  c. Add sodium bicarb as outpatient if no better 9. New diagnosis of prostate cancer 04/2020--Dr. Shadad cc'd a. Continue Rapaflo 8 mg every morning/Flomax 0.4 b. His wife indicates to me that they would be interested in palliative care services only and not hospice because they wish to continue androgen deprivation therapy and so I have explained to her that going to skilled will not obviate that and I have asked that social work speak to them regarding this 10. DM TY 2 with recent DKA a. Dysphagia 1 diet currently given vomiting with cough  b. CBG 115-126 c. Currently on Lantus 10 units (home dose 16) 11. CVA seizure in the past with need for AFOs and gait  instability a. Temporarily hold statins fibrate given vomiting b. Continue Aggrenox twice daily c. Continue Keppra 500 twice daily p.o. but change to IV 12. Parkinson's a. Can continue carbidopa 2 tablets 3 times a day b. Monitor 13. Normocytic anemia with component of anemia from malignancy a. Outpatient management b. Hemoglobin is stable in the 9 range  DVT prophylaxis: Lovenox Code Status: Full code Family Communication: Long discussion bedside with family Disposition: Inpatient Status is: Inpatient  Remains inpatient appropriate because:Hemodynamically unstable, Persistent severe electrolyte disturbances, Unsafe d/c plan and IV treatments appropriate due to intensity of illness or inability to take PO   Dispo: The patient is from: Home              Anticipated d/c is to: Unclear at this time              Anticipated d/c date is: 2 days              Patient currently is not medically stable to d/c.    Consultants:   None at this time  Procedures: No  Antimicrobials: As above  Subjective:  Doing well today no chest pain no fever but did have some vomit with eggs Wife is at bedside she tells me that sputum is improved She has no new other issues to report He has not been out of bed and has not passed a stool today Given this constellation settings I explained to her what may be going on in terms of an ileus and we will get a CT    Objective: Vitals:   06/23/20 1254 06/23/20 2124 06/24/20 0500 06/24/20 1341  BP: (!) 135/55 137/61 (!) 143/64 (!) 148/64  Pulse: 76 67 75 74  Resp: 20 16 16 20   Temp: 98.3 F (36.8 C) 98.1 F (36.7 C) 98.3 F (36.8 C) 97.7 F (36.5 C)  TempSrc: Oral Oral Oral Oral  SpO2: 93% 92% 94% 95%  Weight:  70.8 kg 70.8 kg   Height:        Intake/Output Summary (Last 24 hours) at 06/24/2020 1736 Last data filed at 06/24/2020 1700 Gross per 24 hour  Intake 100 ml  Output 1325 ml  Net -1225 ml   Filed Weights   06/21/20 0657 06/23/20  2124 06/24/20 0500  Weight: 71.2 kg 70.8 kg 70.8 kg    Examination:  Awake coherent no distress EOMI NCAT no focal deficit No icterus no pallor Chest clear no added sound [id number] holosystolic murmur xiphisternal scar from prior Abdomen soft but slightly distended cannot appreciate organomegaly Neurologically intact although weaker on the left upper extremity from prior stroke Answer monosyllabic way with flat affect  Data Reviewed: I have personally reviewed following labs and imaging studies  Sodium 135 potassium 4.4 BUN/creatinine 27/1.03 Hemoglobin 9.3 WBC 9.3  Radiology Studies: DG ABD ACUTE 2+V W 1V CHEST  Result Date: 06/23/2020 CLINICAL DATA:  Small-bowel obstruction. EXAM: X-RAY ABDOMEN 3 VIEW COMPARISON:  CT scan 06/11/2020 FINDINGS: The upright chest x-ray demonstrates improving lung aeration with resolving infiltrates/atelectasis and effusions. Two views of the abdomen demonstrate scattered air throughout the small bowel and colon suggesting gastroenteritis or mild ileus. No significant bowel distention. No findings for small bowel obstruction or free air. The soft tissue shadows are maintained. Advanced vascular calcifications. The bony structures are intact. IMPRESSION: 1. Improving lung aeration with resolving  infiltrates/atelectasis and effusions. 2. Possible gastroenteritis or mild ileus. Electronically Signed   By: Marijo Sanes M.D.   On: 06/23/2020 12:11     Scheduled Meds: . amLODipine  10 mg Oral Daily  . amoxicillin-clavulanate  1 tablet Oral Q12H  . carbidopa-levodopa  2 tablet Oral TID  . collagenase   Topical BID  . dantrolene  50 mg Oral BID  . dextromethorphan-guaiFENesin  2 tablet Oral BID  . dipyridamole-aspirin  1 capsule Oral BID  . enoxaparin (LOVENOX) injection  40 mg Subcutaneous Q24H  . Gerhardt's butt cream   Topical TID  . insulin aspart  0-9 Units Subcutaneous TID WC  . insulin glargine  10 Units Subcutaneous QHS  . iohexol  500 mL Oral Q1H   . levETIRAcetam  500 mg Oral BID  . metoprolol tartrate  25 mg Oral Daily  . sodium chloride flush  3 mL Intravenous Q12H  . tamsulosin  0.4 mg Oral Daily   Continuous Infusions:    LOS: 5 days    Time spent: Emma, MD [facility] To contact the attending provider between 7A-7P or the covering provider during after hours 7P-7A, please log into the web site [email or web address] and access using universal [place] password for that web site. If you do not have the password, please call the hospital operator.  06/24/2020, 5:36 PM

## 2020-06-25 ENCOUNTER — Inpatient Hospital Stay (HOSPITAL_COMMUNITY): Payer: Medicare Other

## 2020-06-25 LAB — COMPREHENSIVE METABOLIC PANEL
ALT: 5 U/L (ref 0–44)
AST: 18 U/L (ref 15–41)
Albumin: 2.9 g/dL — ABNORMAL LOW (ref 3.5–5.0)
Alkaline Phosphatase: 138 U/L — ABNORMAL HIGH (ref 38–126)
Anion gap: 12 (ref 5–15)
BUN: 28 mg/dL — ABNORMAL HIGH (ref 8–23)
CO2: 20 mmol/L — ABNORMAL LOW (ref 22–32)
Calcium: 9.4 mg/dL (ref 8.9–10.3)
Chloride: 103 mmol/L (ref 98–111)
Creatinine, Ser: 1.05 mg/dL (ref 0.61–1.24)
GFR calc Af Amer: >60 mL/min (ref >60)
GFR calc non Af Amer: >60 mL/min (ref >60)
Glucose, Bld: 140 mg/dL — ABNORMAL HIGH (ref 70–99)
Potassium: 5 mmol/L (ref 3.5–5.1)
Sodium: 135 mmol/L (ref 135–145)
Total Bilirubin: 0.6 mg/dL (ref 0.3–1.2)
Total Protein: 6.3 g/dL — ABNORMAL LOW (ref 6.5–8.1)

## 2020-06-25 LAB — CBC WITH DIFFERENTIAL/PLATELET
Abs Immature Granulocytes: 0.05 10*3/uL (ref 0.00–0.07)
Basophils Absolute: 0.1 10*3/uL (ref 0.0–0.1)
Basophils Relative: 1 %
Eosinophils Absolute: 0.2 10*3/uL (ref 0.0–0.5)
Eosinophils Relative: 3 %
HCT: 31 % — ABNORMAL LOW (ref 39.0–52.0)
Hemoglobin: 9.8 g/dL — ABNORMAL LOW (ref 13.0–17.0)
Immature Granulocytes: 1 %
Lymphocytes Relative: 11 %
Lymphs Abs: 0.9 10*3/uL (ref 0.7–4.0)
MCH: 29.9 pg (ref 26.0–34.0)
MCHC: 31.6 g/dL (ref 30.0–36.0)
MCV: 94.5 fL (ref 80.0–100.0)
Monocytes Absolute: 0.9 10*3/uL (ref 0.1–1.0)
Monocytes Relative: 11 %
Neutro Abs: 5.8 10*3/uL (ref 1.7–7.7)
Neutrophils Relative %: 73 %
Platelets: 269 10*3/uL (ref 150–400)
RBC: 3.28 MIL/uL — ABNORMAL LOW (ref 4.22–5.81)
RDW: 20 % — ABNORMAL HIGH (ref 11.5–15.5)
WBC: 7.9 10*3/uL (ref 4.0–10.5)
nRBC: 0 % (ref 0.0–0.2)

## 2020-06-25 LAB — GLUCOSE, CAPILLARY
Glucose-Capillary: 100 mg/dL — ABNORMAL HIGH (ref 70–99)
Glucose-Capillary: 128 mg/dL — ABNORMAL HIGH (ref 70–99)
Glucose-Capillary: 179 mg/dL — ABNORMAL HIGH (ref 70–99)

## 2020-06-25 MED ORDER — POLYETHYLENE GLYCOL 3350 17 G PO PACK
17.0000 g | PACK | Freq: Every day | ORAL | Status: DC
  Filled 2020-06-25: qty 1

## 2020-06-25 MED ORDER — SENNOSIDES-DOCUSATE SODIUM 8.6-50 MG PO TABS
1.0000 | ORAL_TABLET | Freq: Once | ORAL | Status: DC
  Filled 2020-06-25: qty 1

## 2020-06-25 MED ORDER — FUROSEMIDE 20 MG PO TABS
20.0000 mg | ORAL_TABLET | Freq: Every day | ORAL | Status: DC
  Administered 2020-06-25 – 2020-06-27 (×2): 20 mg via ORAL
  Filled 2020-06-25 (×2): qty 1

## 2020-06-25 NOTE — Consult Note (Signed)
Urology Consult   Physician requesting consult: Nita Sells, MD  Reason for consult: Patient with history of metastatic prostate cancer  History of Present Illness: [name] is a 74 y.o. with multiple medical comorbidities along with a urologic history of recently diagnosed widely metastatic prostate cancer, bladder cancer and chronic urinary retention requiring indwelling Foley catheter.  He is currently being treated for suspected aspiration pneumonia along with continued wound care for a perirectal/gluteal abscess.  Urology has been consulted to discuss androgen deprivation therapy as palliative treatment for his metastatic prostate cancer.  The patient is currently resting comfortably in bed and denies any new/worsening lower back or joint pain.  He has left-sided hemiparesis at baseline from a CVA several years ago.  Past Medical History:  Diagnosis Date  . At high risk for falls   . Bladder cancer [facility])    urologist-  dr Gaynelle Arabian  . CAD (coronary artery disease) cardiologist-  dr berry  . Cerebrovascular arteriosclerosis   . Gait disturbance, post-stroke    uses cane, rollator, and w/c long distance  . Hemiparkinsonism [facility]) neurologist-- dr Leta Baptist   right side body  . History of basal cell carcinoma excision    Sept 2016--  MOH's surgery side of nose  . History of carotid artery stenosis cardiologist-- dr berry   bilateral --  1999 s/p  bilateral ICA endarterectomy and recurrent restenosis 2012  s/p  staged bilateral stenting   per last duplex 2015  stents widely patent  . History of CVA with residual deficit neurologist-  dr Leta Baptist   3/ 1999  Right MCA  and  12/ 2004  Anterior division of  Right MCA ---  residual left spactic hemiparesis and left foot drop  . History of MI (myocardial infarction)    02/ 2004   s/p  cabg   . HOH (hard of hearing)    LEFT EAR  POST cva  . Hyperextension deformity of left knee    wears brace  . Hyperlipidemia   .  Hypertension   . Left foot drop    residual from CVA  -- wears brace  . Left spastic hemiparesis ([place])    residual CVA 1999  . Pancreatic pseudocyst   . Peripheral arterial disease ([place])   . S/P CABG x 4    02/ 2004  . Seizure disorder [facility]) last seizure 2011 due to confusion   neurologist-  dr Leta Baptist-- per note seizure documented 2008 breakthrough partial complex seizure (prior tonic-clonic seizure's post CVA)  . Type 2 diabetes, diet controlled ([place])     2  . Visual neglect    LEFT EYE    Past Surgical History:  Procedure Laterality Date  . CARDIAC CATHETERIZATION  11/17/2002   significant left main disease and 3 vessel CAD, mildly depressed LV systolic  fx, 45% left renal artery stenosis  . CAROTID ENDARTERECTOMY  12/1997   [facility]   Bilateral ICA  . CAROTID STENT INSERTION Bilateral dr berry and dr Kristine Royal--  (In-stent Restenosis)  LeftICA   02-12-2011;  [place]   02-26-2011  . CORONARY ARTERY BYPASS GRAFT  2/ 2004   dr hendrickson   x3 LIMA to LAD,free right internal mammary artery to obtuse marginal 1,SVG to distal right coronary.  . CYSTOSCOPY W/ RETROGRADES Bilateral 09/10/2015   Procedure: CYSTOSCOPY WITH RETROGRADE PYELOGRAM;  Surgeon: Carolan Clines, MD;  Location: [facility];  Service: Urology;  Laterality: Bilateral;  . CYSTOSCOPY WITH BIOPSY N/A 09/10/2015  Procedure: CYSTOSCOPY WITH BIOPSY,RIGHT TRIGONAL TUMOR 1.5 CM, EXCISIONAL BIOPSY WITH TAUBER FORCEP RIGHT POSTERIOR BLADDER WALL 1 CM, EXCISIONAL BIOPSY SATELITE BLADDER WALL 1 CM;  Surgeon: Carolan Clines, MD;  Location: [facility];  Service: Urology;  Laterality: N/A;  . CYSTOSCOPY WITH BIOPSY N/A 03/26/2017   Procedure: CYSTOSCOPY WITH BIOPSY/;  Surgeon: Carolan Clines, MD;  Location: [facility];  Service: Urology;  Laterality: N/A;  . CYSTOSCOPY WITH HYDRODISTENSION AND BIOPSY N/A 10/29/2015   Procedure: REPEAT CYSTOSCOPY BIOPSY BLADDER DEEP MUSCLES BLADDER  BIOPSY;  Surgeon: Carolan Clines, MD;  Location: [place];  Service: Urology;  Laterality: N/A;  . CYSTOSCOPY WITH URETHRAL DILATATION N/A 09/10/2015   Procedure: CYSTOSCOPY WITH URETHRAL DILATATION;  Surgeon: Carolan Clines, MD;  Location: [place];  Service: Urology;  Laterality: N/A;  . FULGURATION OF BLADDER TUMOR N/A 03/26/2017   Procedure: FULGURATION OF BLADDER TUMOR;  Surgeon: Carolan Clines, MD;  Location[facility];  Service: Urology;  Laterality: N/A;  . [place]  . MOHS SURGERY  06-20-2015   side of nose  . NM MYOCAR PERF WALL MOTION  01/09/2011   dr berry   mild to mod. perfusion defect in the basal inferolateral & mid inferolaterl regions consistant with an infarct/scar, no signigicant ischemia demonstracted/  Low Risk scan, no sig. change from previous study/  normal LV function and wall motion , ef 63%  . ORIF RIGHT ANKLE FX  1998   hardware retained  . TRANSURETHRAL RESECTION OF BLADDER TUMOR N/A 09/10/2015   Procedure: CYSTO TRANSURETHRAL RESECTION OF BLADDER TUMOR (TURBT) OF LEFT POSTERIOR BLADDER TUMOR 2 CM;  Surgeon: Carolan Clines, MD;  Location: [facility];  Service: Urology;  Laterality: N/A;  . TRANSURETHRAL RESECTION OF BLADDER TUMOR Left 04/17/2016   Procedure: TRANSURETHRAL RESECTION OF BLADDER TUMOR (TURBT);  Surgeon: Carolan Clines, MD;  Location[facility];  Service: Urology;  Laterality: Left;  . US ECHOCARDIOGRAPHY  01/03/2011   normal LVF, ef>55%/  mild LAE/ trace MR and TR,  mild AV sclerosis without stenosis    Current Hospital Medications:  Home Meds:  Current Meds  Medication Sig  . amLODipine (NORVASC) 10 MG tablet TAKE ONE TABLET BY MOUTH DAILY (Patient taking differently: Take 10 mg by mouth daily. )  . amoxicillin-clavulanate (AUGMENTIN) 875-125 MG tablet Take 1 tablet by mouth 2 (two) times daily for 10 days.  . carbidopa-levodopa (SINEMET IR) 25-100 MG tablet  Take 2 tablets by mouth 3 (three) times daily. Change: Take Two tabs 3 x daily  . Cholecalciferol (VITAMIN D) 50 MCG (2000 UT) CAPS Take 1 capsule by mouth daily.  . Coenzyme Q10 (COQ-10) 50 MG CAPS Take 1 capsule by mouth daily.   Marland Kitchen dipyridamole-aspirin (AGGRENOX) 200-25 MG 12hr capsule TAKE ONE CAPSULE BY MOUTH TWICE A DAY (Patient taking differently: Take 1 capsule by mouth 2 (two) times daily. )  . ezetimibe (ZETIA) 10 MG tablet TAKE ONE TABLET BY MOUTH DAILY  . fenofibrate (TRICOR) 48 MG tablet TAKE ONE TABLET BY MOUTH DAILY  . Flaxseed, Linseed, 1000 MG CAPS Take 1,000 mg by mouth every evening.   Marland Kitchen FOLBIC 2.5-25-2 MG TABS tablet Take 1 tablet by mouth once daily with food (Patient taking differently: Take 1 tablet by mouth daily. )  . furosemide (LASIX) 20 MG tablet Take 20 mg by mouth daily.  . hydrALAZINE (APRESOLINE) 25 MG tablet TAKE ONE TABLET BY MOUTH THREE TIMES A DAY (Patient taking differently: Take 25 mg  by mouth 3 (three) times daily. )  . insulin lispro (HUMALOG KWIKPEN) 100 UNIT/ML KiwkPen Inject 4 Units into the skin 3 (three) times daily as needed (high blood sugar > 200).   Marland Kitchen KEPPRA 500 MG tablet TAKE ONE TABLET BY MOUTH TWICE A DAY  . metoprolol tartrate (LOPRESSOR) 25 MG tablet TAKE ONE TABLET BY MOUTH DAILY (Patient taking differently: Take 25 mg by mouth daily. )  . nitroGLYCERIN (NITROSTAT) 0.4 MG SL tablet Place 1 tablet (0.4 mg total) under the tongue every 5 (five) minutes as needed for chest pain.  Marland Kitchen olmesartan (BENICAR) 20 MG tablet TAKE ONE TABLET BY MOUTH DAILY  . ONETOUCH DELICA LANCETS 76E MISC   . ONETOUCH VERIO test strip   . RAPAFLO 8 MG CAPS capsule Take 8 mg by mouth daily with breakfast.   . rosuvastatin (CRESTOR) 20 MG tablet TAKE ONE TABLET BY MOUTH DAILY  . TOUJEO SOLOSTAR 300 UNIT/ML SOPN Inject 16 Units into the skin daily.   . [DISCONTINUED] dantrolene (DANTRIUM) 50 MG capsule Take 1 capsule (50 mg total) by mouth 2 (two) times daily.     Scheduled Meds: . amLODipine  10 mg Oral Daily  . amoxicillin-clavulanate  1 tablet Oral Q12H  . carbidopa-levodopa  2 tablet Oral TID  . collagenase   Topical BID  . dantrolene  50 mg Oral BID  . dextromethorphan-guaiFENesin  2 tablet Oral BID  . dipyridamole-aspirin  1 capsule Oral BID  . enoxaparin (LOVENOX) injection  40 mg Subcutaneous Q24H  . furosemide  20 mg Oral Daily  . Gerhardt's butt cream   Topical TID  . insulin aspart  0-9 Units Subcutaneous TID WC  . insulin glargine  10 Units Subcutaneous QHS  . levETIRAcetam  500 mg Oral BID  . metoprolol tartrate  25 mg Oral Daily  . polyethylene glycol  17 g Oral Daily  . senna-docusate  1 tablet Oral Once  . sodium chloride flush  3 mL Intravenous Q12H  . tamsulosin  0.4 mg Oral Daily   Continuous Infusions: PRN Meds:.acetaminophen **OR** acetaminophen, metoprolol tartrate, ondansetron (ZOFRAN) IV  Allergies:  Allergies  Allergen Reactions  . Altace [Ramipril] Cough    Family History  Problem Relation Age of Onset  . Stroke Father   . Heart attack Father   . Stroke Mother   . Diabetes Brother   . Hyperlipidemia Brother   . Hypertension Brother   . Hyperlipidemia Sister   . Hypertension Sister     Social History:  reports that he quit smoking about 12 years ago. His smoking use included cigarettes. He quit after 5.00 years of use. He has never used smokeless tobacco. He reports that he does not drink alcohol and does not use drugs.  ROS: A complete review of systems was performed.  All systems are negative except for pertinent findings as noted.  Physical Exam:  Vital signs in last 24 hours: Temp:  [98.2 F (36.8 C)-98.6 F (37 C)] 98.2 F (36.8 C) (09/13 1410) Pulse Rate:  [66-82] 68 (09/13 1410) Resp:  [16-22] 22 (09/13 1410) BP: (135-153)/(60-68) 153/68 (09/13 1410) SpO2:  [92 %] 92 % (09/13 1410) Weight:  [48.1 kg] 48.1 kg (09/13 0500) Constitutional:  Alert and oriented, No acute  distress Cardiovascular: Regular rate and rhythm, No JVD Respiratory: Normal respiratory effort, Lungs clear bilaterally GI: Abdomen is soft, nontender, nondistended, no abdominal masses GU: No CVA tenderness.  Foley catheter in place and draining cloudy urine with white debris.  Both  testicles are descended and nontender to palpation.  The scrotal skin is nonerythematous with no crepitus or signs of necrosis Neurologic: Left hemiparesis Psychiatric: Normal mood and affect  Laboratory Data:  Recent Labs    06/23/20 0532 06/25/20 0825  WBC 9.3 7.9  HGB 9.3* 9.8*  HCT 30.3* 31.0*  PLT 275 269    Recent Labs    06/23/20 0532 06/24/20 0534 06/25/20 0825  NA 137 135 135  K 4.5 4.4 5.0  CL 103 103 103  GLUCOSE 108* 122* 140*  BUN 33* 27* 28*  CALCIUM 9.2 9.5 9.4  CREATININE 1.16 1.03 1.05     Results for orders placed or performed during the hospital encounter of 06/19/20 (from the past 24 hour(s))  Glucose, capillary     Status: Abnormal   Collection Time: 06/24/20  9:19 PM  Result Value Ref Range   Glucose-Capillary 110 (H) 70 - 99 mg/dL  Glucose, capillary     Status: Abnormal   Collection Time: 06/25/20  7:40 AM  Result Value Ref Range   Glucose-Capillary 100 (H) 70 - 99 mg/dL  CBC with Differential/Platelet     Status: Abnormal   Collection Time: 06/25/20  8:25 AM  Result Value Ref Range   WBC 7.9 4.0 - 10.5 K/uL   RBC 3.28 (L) 4.22 - 5.81 MIL/uL   Hemoglobin 9.8 (L) 13.0 - 17.0 g/dL   HCT 31.0 (L) 39 - 52 %   MCV 94.5 80.0 - 100.0 fL   MCH 29.9 26.0 - 34.0 pg   MCHC 31.6 30.0 - 36.0 g/dL   RDW 20.0 (H) 11.5 - 15.5 %   Platelets 269 150 - 400 K/uL   nRBC 0.0 0.0 - 0.2 %   Neutrophils Relative % 73 %   Neutro Abs 5.8 1.7 - 7.7 K/uL   Lymphocytes Relative 11 %   Lymphs Abs 0.9 0.7 - 4.0 K/uL   Monocytes Relative 11 %   Monocytes Absolute 0.9 0 - 1 K/uL   Eosinophils Relative 3 %   Eosinophils Absolute 0.2 0 - 0 K/uL   Basophils Relative 1 %   Basophils  Absolute 0.1 0 - 0 K/uL   Immature Granulocytes 1 %   Abs Immature Granulocytes 0.05 0.00 - 0.07 K/uL  Comprehensive metabolic panel     Status: Abnormal   Collection Time: 06/25/20  8:25 AM  Result Value Ref Range   Sodium 135 135 - 145 mmol/L   Potassium 5.0 3.5 - 5.1 mmol/L   Chloride 103 98 - 111 mmol/L   CO2 20 (L) 22 - 32 mmol/L   Glucose, Bld 140 (H) 70 - 99 mg/dL   BUN 28 (H) 8 - 23 mg/dL   Creatinine, Ser 1.05 0.61 - 1.24 mg/dL   Calcium 9.4 8.9 - 10.3 mg/dL   Total Protein 6.3 (L) 6.5 - 8.1 g/dL   Albumin 2.9 (L) 3.5 - 5.0 g/dL   AST 18 15 - 41 U/L   ALT 5 0 - 44 U/L   Alkaline Phosphatase 138 (H) 38 - 126 U/L   Total Bilirubin 0.6 0.3 - 1.2 mg/dL   GFR calc non Af Amer >60 >60 mL/min   GFR calc Af Amer >60 >60 mL/min   Anion gap 12 5 - 15  Glucose, capillary     Status: Abnormal   Collection Time: 06/25/20 11:45 AM  Result Value Ref Range   Glucose-Capillary 128 (H) 70 - 99 mg/dL   Recent Results (from the past 240  hour(s))  Blood culture (routine x 2)     Status: None   Collection Time: 06/19/20  1:08 PM   Specimen: BLOOD  Result Value Ref Range Status   Specimen Description   Final    BLOOD SITE NOT SPECIFIED Performed at [facility], [address] [place], [place] [address]    Special Requests   Final    BOTTLES DRAWN AEROBIC AND ANAEROBIC Blood Culture results may not be optimal due to an excessive volume of blood received in culture bottles Performed at [facility] [address] [place], [place] [address]    Culture   Final    NO GROWTH 5 DAYS Performed at [facility], [place] [address] [place], [place] [address]    Report Status 06/24/2020 FINAL  Final  Blood culture (routine x 2)     Status: None   Collection Time: 06/19/20  1:08 PM   Specimen: BLOOD RIGHT HAND  Result Value Ref Range Status   Specimen Description   Final    BLOOD RIGHT HAND Performed at [facility] [address]  [place], [place] [address]    Special Requests   Final    BOTTLES DRAWN AEROBIC ONLY Blood Culture results may not be optimal due to an inadequate volume of blood received in culture bottles Performed at [facility] [address], [place], [place] [address]    Culture   Final    NO GROWTH 5 DAYS Performed at [facility], [place] [address], [place], [place] [address]    Report Status 06/24/2020 FINAL  Final  Culture, Urine     Status: Abnormal   Collection Time: 06/19/20  1:08 PM   Specimen: Urine, Clean Catch  Result Value Ref Range Status   Specimen Description   Final    URINE, CLEAN CATCH Performed at [facility], [place] [address] [place], [place] [address]    Special Requests   Final    NONE Performed at [facility], [place] [address] [place], [place] [address]    Culture MULTIPLE SPECIES PRESENT, SUGGEST RECOLLECTION (A)  Final   Report Status 06/20/2020 FINAL  Final  SARS Coronavirus 2 by RT PCR (hospital order, performed in [facility] hospital lab) Nasopharyngeal Nasopharyngeal Swab     Status: None   Collection Time: 06/19/20  1:34 PM   Specimen: Nasopharyngeal Swab  Result Value Ref Range Status   SARS Coronavirus 2 NEGATIVE NEGATIVE Final    Comment: (NOTE) SARS-CoV-2 target nucleic acids are NOT DETECTED.  The SARS-CoV-2 RNA is generally detectable in upper and lower respiratory specimens during the acute phase of infection. The lowest concentration of SARS-CoV-2 viral copies this assay can detect is 250 copies / mL. A negative result does not preclude SARS-CoV-2 infection and should not be used as the sole basis for treatment or other patient management decisions.  A negative result may occur with improper specimen collection / handling, submission of specimen other than nasopharyngeal swab, presence of viral mutation(s) within the areas targeted by this assay, and inadequate number of viral copies (<250  copies / mL). A negative result must be combined with clinical observations, patient history, and epidemiological information.  Fact Sheet for Patients:   StrictlyIdeas.no  Fact Sheet for Healthcare Providers: BankingDealers.co.za  This test is not yet approved or  cleared by the [place] FDA and has been authorized for detection and/or diagnosis of SARS-CoV-2 by  FDA under an Emergency Use Authorization (EUA).  This EUA will remain in effect (meaning this test can be used) for the duration of the COVID-19 declaration under Section 564(b)(1) of the Act, 21 U.S.C. section 360bbb-3(b)(1), unless the authorization is terminated or revoked sooner.  Performed at [facility], [place] [address], [place], [place] [address]   C Difficile Quick Screen w PCR reflex     Status: None   Collection Time: 06/20/20  5:00 AM   Specimen: STOOL  Result Value Ref Range Status   C Diff antigen NEGATIVE NEGATIVE Final   C Diff toxin NEGATIVE NEGATIVE Final   C Diff interpretation No C. difficile detected.  Final    Comment: Performed at [facility], [place] [address] [place], [place] [address]  MRSA PCR Screening     Status: None   Collection Time: 06/20/20  1:10 PM   Specimen: Nasal Mucosa; Nasopharyngeal  Result Value Ref Range Status   MRSA by PCR NEGATIVE NEGATIVE Final    Comment:        The GeneXpert MRSA Assay (FDA approved for NASAL specimens only), is one component of a comprehensive MRSA colonization surveillance program. It is not intended to diagnose MRSA infection nor to guide or monitor treatment for MRSA infections. Performed at [facility], [place] [address] [place], [place] [address]     Renal Function: Recent Labs    06/19/20 1308 06/20/20 0434 06/21/20 0519 06/22/20 0500 06/23/20 0532 06/24/20 0534 06/25/20 0825  CREATININE 1.30* 1.31* 1.37* 1.24 1.16 1.03 1.05    Estimated Creatinine Clearance: 40.7 mL/min (by C-G formula based on SCr of 1.05 mg/dL).  Radiologic Imaging: CT ABDOMEN PELVIS WO CONTRAST  Result Date: 06/24/2020 CLINICAL DATA:  Abdominal pain. EXAM: CT ABDOMEN AND PELVIS WITHOUT CONTRAST TECHNIQUE: Multidetector CT imaging of the abdomen and pelvis was performed following the standard protocol without IV contrast. COMPARISON:  June 11, 2020 FINDINGS: Lower chest: Multiple sternal wires are present. Predominant stable mild to moderate severity areas of atelectasis and/or infiltrate are seen within the bilateral lung bases. Small bilateral pleural effusions are also seen. These are increased in size when compared to the prior exam. Hepatobiliary: No focal liver abnormality is seen. Status post cholecystectomy. No biliary dilatation. Pancreas: Enlargement of the pancreatic head is noted which measures approximately 3.9 cm x 3.5 cm. The remaining visualized portion of the pancreatic parenchyma is unremarkable. Spleen: Normal in size without focal abnormality. Adrenals/Urinary Tract: Adrenal glands are unremarkable. Kidneys are normal in size, without renal calculi or hydronephrosis. A 2.1 cm diameter cyst is seen within the posterior aspect of the mid left kidney. Persistent mild to moderate severity bilateral perinephric inflammatory fat stranding is noted. A Foley catheter is seen within the urinary bladder. Mild diffuse urinary bladder wall thickening is also seen with a mild amount of surrounding inflammatory fat stranding. Stomach/Bowel: Stomach is within normal limits. Appendix appears normal. No evidence of bowel wall thickening, distention, or inflammatory changes. Vascular/Lymphatic: There is marked severity calcification of the abdominal aorta and bilateral common iliac arteries, without evidence of aneurysmal dilatation. No enlarged abdominal or pelvic lymph nodes. Reproductive: Mild to moderate severity prostate gland enlargement is seen.  Other: No abdominal wall hernia or abnormality. No abdominopelvic ascites. Musculoskeletal: Mild to moderate severity inflammatory fat stranding is seen within the left ischial anal fossa. There is no evidence of associated fluid collection or abscess. The soft tissue air seen within this region on the prior study is  no longer present. Numerous heterogeneous sclerotic foci are seen throughout the thoracolumbar spine and bony pelvis. IMPRESSION: 1. Mild to moderate severity residual inflammatory fat stranding within the left ischioanal fossa without evidence of associated fluid collection, soft tissue air or abscess. 2. Numerous heterogeneous sclerotic foci throughout the thoracolumbar spine and bony pelvis, consistent with osseous metastasis. 3. Enlargement of the pancreatic head which measures approximately 3.9 cm x 3.5 cm. MRI correlation is recommended to further exclude the presence of an underlying pancreatic mass. 4. Small bilateral pleural effusions, increased in size when compared to the prior exam. 5. Stable, mild to moderate severity bibasilar atelectasis and/or infiltrate. 6. Enlarged prostate gland. 7. Mild diffuse urinary bladder wall thickening with a mild amount of surrounding inflammatory fat stranding. While this may represent sequelae associated with cystitis, correlation with urinalysis is recommended. 8. 2.1 cm left renal cyst. 9. Aortic atherosclerosis. Aortic Atherosclerosis (ICD10-I70.0). Electronically Signed   By: Virgina Norfolk M.D.   On: 06/24/2020 22:50   DG Abd 2 Views  Result Date: 06/25/2020 CLINICAL DATA:  Nausea and vomiting with eating EXAM: X-RAY ABDOMEN 2 VIEWS COMPARISON:  Abdomen and pelvis CT from yesterday FINDINGS: Normal bowel gas pattern. No concerning mass effect or gas collection. Atheromatous calcification of the aorta, iliacs, and branch vessels. Negative for pneumoperitoneum when decubitus. Cholecystectomy clips. IMPRESSION: Normal bowel gas pattern.  No  evidence of pneumoperitoneum. Electronically Signed   By: Monte Fantasia M.D.   On: 06/25/2020 10:12    I independently reviewed the above imaging studies.  Impression/Recommendation 1.  Metastatic prostate cancer-I had a long discussion with the patient and his ex-wife concerning the utilization of androgen deprivation therapy in the setting of metastatic prostate cancer.  We discussed the risk, benefits and alternatives to therapy.  Risk include, but are not limited to hot flashes/night sweats, lethargy, weight gain, peripheral edema and bone demineralization.  Ultimately, the patient would like to pursue treatment, but after he has recovered from his current bout of pneumonia.  Since he is coming to the wound care center, which is located just above my office, I will arrange a time for him to come in for a Firmagon injection once his discharge disposition is more solidified.  2.  Urinary retention- Recommend exchanging his Foley catheter and obtaining a urine specimen for culture and sensitivity.  Ellison Hughs, MD [facility] 06/25/2020, 5:14 PM

## 2020-06-25 NOTE — Progress Notes (Signed)
PROGRESS NOTE    [name]  ZOX:[id number] DOB: 1946-01-03 DOA: 06/19/2020 PCP: [name], MD  Brief Narrative:  74 year old white male CABG + PAD with resulting HFpEF (EF 81-19% grade 2 diastolic dysfunction), HTN Prior rectal abscess with recent hospitalization for septic shock New (04/2020) prostate cancer with mets to the bone followed by Dr. Alen Blew--- very debilitated after last hospital stay DM TY 2 Parkinson's disease, prior CVA, seizure disorder CKD 3-3 B baseline creatinine 1.9  Hospitalization 8/30 through 06/18/2020 with septic shock in addition to DKA secondary to perirectal abscess-received hydrotherapy-General surgery consulted did not feel surgical debridement was recommended-received broad-spectrum antibiotics tailored to Augmentin on discharge for 10 more days of Augmentin and was to follow-up with wound care center on discharge Also was supposed to follow-up with urology to commence androgen deprivation therapy  Discharged to the care of his ex-wife who is his caretaker-had developed a cough in the past week in addition to a rash in the groin Also had 5 loose stools In emergency room sodium 132 potassium 5.5 WBC 12.8 BNP 2077 CXR?  Multifocal pneumonia Started on vancomycin and cefepime C. difficile tested but negative  Due to uncertainty of diagnosis-two-view x-ray was repeated that confirmed pneumonia Developed ileus and some vomiting on 9/11-CT scan of the abdomen pelvis shows a new area in the pancreas 4X 3.5 cm in the pancreatic head  but no bowel obstruction  Assessment & Plan:   Principal Problem:   Respiratory failure ([place]) Active Problems:   Seizure disorder ([place])   Dyslipidemia, goal LDL below 70   Parkinson's disease ([place])   Essential hypertension   History of stroke   Abscess of buttock   Hyponatremia   Acute on chronic diastolic CHF (congestive heart failure) ([place])   Type 2 diabetes mellitus without complication (H[facility])   Multifocal  pneumonia   Candidiasis of scrotum   1. Sepsis 2/2 aspiration pneumonia 2. Abscess of perirectal area ruled out by exam 3. Chronic indwelling Foley is colonized-does not have urinary infection a. Received vancomycin azithromycin and cefepime-narrowed 9/10 Augmentin b. Repeat 2 view CXR 9/9 confirmed pneumonia i. Vomit X2 on 9/11-post tussive with gagging-certain consistencies are difficult for him to swallow at times ii. Polypharmacy minimized 4. ?  Partial SBO versus ileus a. 2 episodes of vomiting on the 11th-posttussive gagging b. SBO ruled out by CT scan 9/12, 2 view x-ray 9/13 - for ileus c. Continue dysphagia 1 diet if tolerating 5. Pancreatic mass 3.9 X3.5 a. Outpatient characterization MRI versus PET scan b. He is already on palliative care and I have discussed the case with Dr. Alen Blew and [facility] Dr. Lovena Neighbours in addition with regards to goals of care see below 6. Chronic diarrhea with tunneling wound from perirectal abscess a. Reexamined wound 9/11 is improving continue outpatient wound management [facility] b. Further management as per wound care 7. HFpEF acute decompensated on admission a. BNP elevated likely secondary to infection b. Lasix resumed on 9/13 c. Continue metoprolol 25 d. -6.0 L weight inaccurate today e. Minus 6.4 L 8. Hyperkalemia on admission 9. Mild metabolic acidosis probably combination of sepsis and AKI superimposed CKD 2 a. Holding ARB 2/2 hyperkalemia  b. CO2 is variable-continue to monitor  10. New diagnosis of prostate cancer 04/2020--Dr. Shadad cc'd a. Continue Rapaflo 8 mg every morning/Flomax 0.4 11. DM TY 2 with recent DKA a. Dysphagia 1 diet currently given vomiting with cough b. CBG 100-128 c. Currently on Lantus 10 units (home dose 16)  may need to downward adjust dose 12. CVA seizure in the past with need for AFOs and gait instability a. Temporarily hold statins fibrate given vomiting b. Continue Aggrenox twice  daily c. Continue Keppra 500 twice daily p.o. but change to IV 13. Parkinson's a. Can continue carbidopa 2 tablets 3 times a day b. Monitor 14. Normocytic anemia with component of anemia from malignancy a. Outpatient management b. Hemoglobin is stable in the 9 range  DVT prophylaxis: Lovenox Code Status: Full code Family Communication: Long discussion bedside with patient regarding goals of care-I discussed with his wife and patient who is oriented that there on CT scan appears to be around 4 cm mass in the head of the pancreas-we are not sure significance of this-she tells me he has had pancreatitis She also relates that he has been on palliative care in the past and that he would not get the benefit of androgen deprivation therapy if he is on hospice I have explained in no uncertain terms to both family and patient that given 2 admissions back-to-back with debility weakness and sepsis he is at very high risk for short-term mortality-I have explained this in layman terms and in language that they both understand clearly I have asked them if they are interested in speaking with palliative care again but it appears that they had already had conversations prior to last discharge just could not complete the conversations as he was readmitted immediately Patient will probably be poor candidate for aggressive measures but I have asked both Dr. Lovena Neighbours and Dr. Alen Blew to look in on the patient and comment and discussed this with the family we can engage palliative care as an outpatient Disposition: Inpatient Status is: Inpatient  Remains inpatient appropriate because:Hemodynamically unstable, Persistent severe electrolyte disturbances, Unsafe d/c plan and IV treatments appropriate due to intensity of illness or inability to take PO   Dispo: The patient is from: Home              Anticipated d/c is to: Unclear at this time              Anticipated d/c date is: 2 days              Patient currently is  not medically stable to d/c.    Consultants:   None at this time  Procedures: No  Antimicrobials: As above  Subjective:  Doing better Sputum is better Tolerating liquids No chest pain Not had diarrhea Long discussion as above    Objective: Vitals:   06/24/20 2248 06/25/20 0442 06/25/20 0500 06/25/20 1410  BP: (!) 144/60 135/63  (!) 153/68  Pulse: 75 66  68  Resp:  18  (!) 22  Temp:  98.4 F (36.9 C)  98.2 F (36.8 C)  TempSrc:  Oral  Oral  SpO2:  92%  92%  Weight:   48.1 kg   Height:        Intake/Output Summary (Last 24 hours) at 06/25/2020 1444 Last data filed at 06/25/2020 0815 Gross per 24 hour  Intake 300 ml  Output 1375 ml  Net -1075 ml   Filed Weights   06/23/20 2124 06/24/20 0500 06/25/20 0500  Weight: 70.8 kg 70.8 kg 48.1 kg    Examination:  Pleasant coherent no distress Chest clear no added sounds rales rhonchi S1-S2 no murmur regular rate rhythm Slight tremor flat affect Abdomen soft nontender Foley catheter in place Skin-I did not examine wounds today on sacrum No lower extremity edema  Data Reviewed: I have personally reviewed following labs and imaging studies Sodium 135 Potassium 5.0 CO2 20 BUNs/creatinine 28/1.05 Alk phos 138 AST/ALT 18/5   Radiology Studies: CT ABDOMEN PELVIS WO CONTRAST  Result Date: 06/24/2020 CLINICAL DATA:  Abdominal pain. EXAM: CT ABDOMEN AND PELVIS WITHOUT CONTRAST TECHNIQUE: Multidetector CT imaging of the abdomen and pelvis was performed following the standard protocol without IV contrast. COMPARISON:  June 11, 2020 FINDINGS: Lower chest: Multiple sternal wires are present. Predominant stable mild to moderate severity areas of atelectasis and/or infiltrate are seen within the bilateral lung bases. Small bilateral pleural effusions are also seen. These are increased in size when compared to the prior exam. Hepatobiliary: No focal liver abnormality is seen. Status post cholecystectomy. No biliary  dilatation. Pancreas: Enlargement of the pancreatic head is noted which measures approximately 3.9 cm x 3.5 cm. The remaining visualized portion of the pancreatic parenchyma is unremarkable. Spleen: Normal in size without focal abnormality. Adrenals/Urinary Tract: Adrenal glands are unremarkable. Kidneys are normal in size, without renal calculi or hydronephrosis. A 2.1 cm diameter cyst is seen within the posterior aspect of the mid left kidney. Persistent mild to moderate severity bilateral perinephric inflammatory fat stranding is noted. A Foley catheter is seen within the urinary bladder. Mild diffuse urinary bladder wall thickening is also seen with a mild amount of surrounding inflammatory fat stranding. Stomach/Bowel: Stomach is within normal limits. Appendix appears normal. No evidence of bowel wall thickening, distention, or inflammatory changes. Vascular/Lymphatic: There is marked severity calcification of the abdominal aorta and bilateral common iliac arteries, without evidence of aneurysmal dilatation. No enlarged abdominal or pelvic lymph nodes. Reproductive: Mild to moderate severity prostate gland enlargement is seen. Other: No abdominal wall hernia or abnormality. No abdominopelvic ascites. Musculoskeletal: Mild to moderate severity inflammatory fat stranding is seen within the left ischial anal fossa. There is no evidence of associated fluid collection or abscess. The soft tissue air seen within this region on the prior study is no longer present. Numerous heterogeneous sclerotic foci are seen throughout the thoracolumbar spine and bony pelvis. IMPRESSION: 1. Mild to moderate severity residual inflammatory fat stranding within the left ischioanal fossa without evidence of associated fluid collection, soft tissue air or abscess. 2. Numerous heterogeneous sclerotic foci throughout the thoracolumbar spine and bony pelvis, consistent with osseous metastasis. 3. Enlargement of the pancreatic head which  measures approximately 3.9 cm x 3.5 cm. MRI correlation is recommended to further exclude the presence of an underlying pancreatic mass. 4. Small bilateral pleural effusions, increased in size when compared to the prior exam. 5. Stable, mild to moderate severity bibasilar atelectasis and/or infiltrate. 6. Enlarged prostate gland. 7. Mild diffuse urinary bladder wall thickening with a mild amount of surrounding inflammatory fat stranding. While this may represent sequelae associated with cystitis, correlation with urinalysis is recommended. 8. 2.1 cm left renal cyst. 9. Aortic atherosclerosis. Aortic Atherosclerosis (ICD10-I70.0). Electronically Signed   By: Virgina Norfolk M.D.   On: 06/24/2020 22:50   DG Abd 2 Views  Result Date: 06/25/2020 CLINICAL DATA:  Nausea and vomiting with eating EXAM: X-RAY ABDOMEN 2 VIEWS COMPARISON:  Abdomen and pelvis CT from yesterday FINDINGS: Normal bowel gas pattern. No concerning mass effect or gas collection. Atheromatous calcification of the aorta, iliacs, and branch vessels. Negative for pneumoperitoneum when decubitus. Cholecystectomy clips. IMPRESSION: Normal bowel gas pattern.  No evidence of pneumoperitoneum. Electronically Signed   By: Monte Fantasia M.D.   On: 06/25/2020 10:12     Scheduled Meds: . amLODipine  10 mg Oral Daily  . amoxicillin-clavulanate  1 tablet Oral Q12H  . carbidopa-levodopa  2 tablet Oral TID  . collagenase   Topical BID  . dantrolene  50 mg Oral BID  . dextromethorphan-guaiFENesin  2 tablet Oral BID  . dipyridamole-aspirin  1 capsule Oral BID  . enoxaparin (LOVENOX) injection  40 mg Subcutaneous Q24H  . Gerhardt's butt cream   Topical TID  . insulin aspart  0-9 Units Subcutaneous TID WC  . insulin glargine  10 Units Subcutaneous QHS  . levETIRAcetam  500 mg Oral BID  . metoprolol tartrate  25 mg Oral Daily  . polyethylene glycol  17 g Oral Daily  . senna-docusate  1 tablet Oral Once  . sodium chloride flush  3 mL Intravenous  Q12H  . tamsulosin  0.4 mg Oral Daily   Continuous Infusions:    LOS: 6 days    Time spent: [place], MD [facility] To contact the attending provider between 7A-7P or the covering provider during after hours 7P-7A, please log into the web site [email or web address] and access using universal [place] password for that web site. If you do not have the password, please call the hospital operator.  06/25/2020, 2:44 PM

## 2020-06-25 NOTE — Care Management Important Message (Signed)
Important Message  Patient Details IM Letter given to the Patient Name: [name] MRN: [id number] Date of Birth: 1945-11-12   Medicare Important Message Given:  Yes     Kerin Salen 06/25/2020, 11:08 AM

## 2020-06-25 NOTE — Progress Notes (Signed)
[facility]) Outpatient Palliative Care  Referral for outpatient palliative care received during last hospitalization.  Pt was d/c'd and re-admitted prior to our services beginning.  [facility] will continue to follow and begin services once he discharges.  Venia Carbon RN, BSN, [place] [facility] Liaison

## 2020-06-26 ENCOUNTER — Inpatient Hospital Stay (HOSPITAL_COMMUNITY): Payer: Medicare Other

## 2020-06-26 LAB — CBC WITH DIFFERENTIAL/PLATELET
Abs Immature Granulocytes: 0.04 10*3/uL (ref 0.00–0.07)
Basophils Absolute: 0 10*3/uL (ref 0.0–0.1)
Basophils Relative: 0 %
Eosinophils Absolute: 0.3 10*3/uL (ref 0.0–0.5)
Eosinophils Relative: 3 %
HCT: 31.9 % — ABNORMAL LOW (ref 39.0–52.0)
Hemoglobin: 10 g/dL — ABNORMAL LOW (ref 13.0–17.0)
Immature Granulocytes: 1 %
Lymphocytes Relative: 11 %
Lymphs Abs: 0.9 10*3/uL (ref 0.7–4.0)
MCH: 30.1 pg (ref 26.0–34.0)
MCHC: 31.3 g/dL (ref 30.0–36.0)
MCV: 96.1 fL (ref 80.0–100.0)
Monocytes Absolute: 0.9 10*3/uL (ref 0.1–1.0)
Monocytes Relative: 11 %
Neutro Abs: 5.9 10*3/uL (ref 1.7–7.7)
Neutrophils Relative %: 74 %
Platelets: 270 10*3/uL (ref 150–400)
RBC: 3.32 MIL/uL — ABNORMAL LOW (ref 4.22–5.81)
RDW: 19.9 % — ABNORMAL HIGH (ref 11.5–15.5)
WBC: 8 10*3/uL (ref 4.0–10.5)
nRBC: 0 % (ref 0.0–0.2)

## 2020-06-26 LAB — GLUCOSE, CAPILLARY
Glucose-Capillary: 89 mg/dL (ref 70–99)
Glucose-Capillary: 105 mg/dL — ABNORMAL HIGH (ref 70–99)
Glucose-Capillary: 121 mg/dL — ABNORMAL HIGH (ref 70–99)
Glucose-Capillary: 109 mg/dL — ABNORMAL HIGH (ref 70–99)
Glucose-Capillary: 122 mg/dL — ABNORMAL HIGH (ref 70–99)

## 2020-06-26 LAB — COMPREHENSIVE METABOLIC PANEL
ALT: 6 U/L (ref 0–44)
AST: 14 U/L — ABNORMAL LOW (ref 15–41)
Albumin: 3.1 g/dL — ABNORMAL LOW (ref 3.5–5.0)
Alkaline Phosphatase: 71 U/L (ref 38–126)
Anion gap: 14 (ref 5–15)
BUN: 25 mg/dL — ABNORMAL HIGH (ref 8–23)
CO2: 22 mmol/L (ref 22–32)
Calcium: 9.5 mg/dL (ref 8.9–10.3)
Chloride: 103 mmol/L (ref 98–111)
Creatinine, Ser: 1.11 mg/dL (ref 0.61–1.24)
GFR calc Af Amer: >60 mL/min (ref >60)
GFR calc non Af Amer: >60 mL/min (ref >60)
Glucose, Bld: 113 mg/dL — ABNORMAL HIGH (ref 70–99)
Potassium: 4.8 mmol/L (ref 3.5–5.1)
Sodium: 139 mmol/L (ref 135–145)
Total Bilirubin: 0.6 mg/dL (ref 0.3–1.2)
Total Protein: 6.5 g/dL (ref 6.5–8.1)

## 2020-06-26 MED ORDER — COLLAGENASE 250 UNIT/GM EX OINT: TOPICAL_OINTMENT | Freq: Two times a day (BID) | CUTANEOUS | 0 refills | Status: AC

## 2020-06-26 MED ORDER — DM-GUAIFENESIN ER 30-600 MG PO TB12: 2.0000 | ORAL_TABLET | Freq: Two times a day (BID) | ORAL | 0 refills | Status: DC

## 2020-06-26 MED ORDER — SENNOSIDES-DOCUSATE SODIUM 8.6-50 MG PO TABS: 1.0000 | ORAL_TABLET | Freq: Once | ORAL | 0 refills | Status: AC

## 2020-06-26 MED ORDER — ACETAMINOPHEN 325 MG PO TABS: 650.0000 mg | ORAL_TABLET | Freq: Four times a day (QID) | ORAL | Status: AC | PRN

## 2020-06-26 MED ORDER — AMOXICILLIN-POT CLAVULANATE 875-125 MG PO TABS: 1.0000 | ORAL_TABLET | Freq: Two times a day (BID) | ORAL | 0 refills | Status: AC

## 2020-06-26 MED ORDER — TOUJEO SOLOSTAR 300 UNIT/ML ~~LOC~~ SOPN: 10.0000 [IU] | PEN_INJECTOR | Freq: Every day | SUBCUTANEOUS | Status: DC

## 2020-06-26 MED ORDER — GERHARDT'S BUTT CREAM: 1.0000 "application " | TOPICAL_CREAM | Freq: Three times a day (TID) | CUTANEOUS | 0 refills | Status: DC

## 2020-06-26 NOTE — Discharge Summary (Signed)
Physician Discharge Summary  [name] AVW:[id number] DOB: 1946-09-14 DOA: 06/19/2020  PCP: [name], MD  Admit date: 06/19/2020 Discharge date: 06/26/2020  Time spent: 55 minutes   Recommendations for Outpatient Follow-up:  1. Recommend CXR 1 week + CBC + Chem-12 + magnesium 2. Complete Levaquin as per MAR-may need to extend dose if continues to have cough-like symptoms 3. Noticed discontinuation of various antihypertensives this admission such as Benicar etc. given his prerenal azotemia 4. Dr. Lovena Neighbours urologist and Dr. Alen Blew oncologist will be CCed on his care 5. Will require care coordination with [facility] will be providing palliative care at home-if patient fails to thrive he may require hospice care as per my discussions with him and his wife this admission 6. Recommend discussions about goals of care with all specialists and PCP in the outpatient setting 7. Should resume wound care at [facility] wound center once stabilizes from this hospital stay probably in several weeks 8. This admission a lesion was found in pancreas that may be a malignancy--I will CC Dr. Alen Blew to ensure he is aware so that follow-up can be sought 9. Please see below for wound care instructions  Discharge Diagnoses:  Principal Problem:   Respiratory failure ([place]) Active Problems:   Seizure disorder ([place])   Dyslipidemia, goal LDL below 70   Parkinson's disease ([place])   Essential hypertension   History of stroke   Abscess of buttock   Hyponatremia   Acute on chronic diastolic CHF (congestive heart failure) (HCC)   Type 2 diabetes mellitus without complication ([place])   Multifocal pneumonia   Candidiasis of scrotum   Discharge Condition: Guarded  Diet recommendation: Diabetic hypertensive diet  Filed Weights   06/24/20 0500 06/25/20 0500 06/26/20 0641  Weight: 70.8 kg 48.1 kg 71.2 kg    History of present illness:  74 year old white male CABG + PAD with resulting HFpEF (EF  53-74% grade 2 diastolic dysfunction), HTN Prior rectal abscess with recent hospitalization for septic shock New (04/2020) prostate cancer with mets to the bone followed by Dr. Alen Blew--- very debilitated after last hospital stay DM TY 2 Parkinson's disease, prior CVA, seizure disorder CKD 3-3 B baseline creatinine 1.9  Hospitalization 8/30 through 06/18/2020 with septic shock in addition to DKA secondary to perirectal abscess-received hydrotherapy-General surgery consulted did not feel surgical debridement was recommended-received broad-spectrum antibiotics tailored to Augmentin on discharge for 10 more days of Augmentin and was to follow-up with wound care center on discharge Also was supposed to follow-up with urology to commence androgen deprivation therapy  Discharged to the care of his ex-wife who is his caretaker-readmitted to [facility] hospital after 1 day of being at home on 9/7  -Developed a cough in the past week in addition to a rash in the groin  Also had 5 loose stools In emergency room sodium 132 potassium 5.5 WBC 12.8 BNP 2077 CXR?  Multifocal pneumonia Started on vancomycin and cefepime C. difficile tested but negative  Due to uncertainty of diagnosis-two-view x-ray was repeated that confirmed pneumonia  Developed ileus and some vomiting on 9/11-CT scan of the abdomen pelvis shows a new area in the pancreas 4X 3.5 cm in the pancreatic head  but no bowel obstruction   Hospital Course:  1. Sepsis 2/2 aspiration pneumonia 1. Abscess of perirectal area ruled out by exam 2. Chronic indwelling Foley is colonized-does not have urinary infection a. Received vancomycin azithromycin and cefepime-narrowed 9/10 Augmentin and will complete a total of 10 days Augmentin for aspiration  pneumonia b. Repeat 2 view CXR 9/9 confirmed pneumonia i. Vomit X2 on 9/11-post tussive with gagging-certain consistencies are difficult for him to swallow at times ii. Polypharmacy minimized 3. ?   Partial SBO versus ileus a. 2 episodes of vomiting on the 11th-posttussive gagging b. SBO ruled out by CT scan 9/12, 2 view x-ray 9/13 - for ileus c. He has had recurrent episodes of emesis therefore Dr. Benson Norway of gastroenterology saw the patient on 9/13 but felt that endoscopy and other modalities for diagnosing this would be low yield and the patient has been instructed to continue his dysphagia diet d. Family is aware that he will continue to have some cough and potentially aspiration-I have counseled him regarding hospice care and he may need to discuss this further with his PCP as they wish to have palliative care only to be able to get androgen deprivation therapy for his new diagnosis of prostate cancer 4. Pancreatic mass 3.9 X3.5 a. Outpatient characterization MRI versus PET scan b. He is already on palliative care and I have discussed the case with Dr. Alen Blew and [facility] Dr. Lovena Neighbours in addition with regards to goals of care see below c. We will ensure that he has follow-up 5. Chronic diarrhea with tunneling wound from perirectal abscess a. Reexamined wound 9/11 is improving continue outpatient wound management  wound center b. Further management as per wound care as per below note 6. HFpEF acute decompensated on admission a. BNP elevated likely secondary to infection b. Lasix resumed on 9/13 c. Continue metoprolol 25 d. -6.0 L weight inaccurate 7. Hyperkalemia on admission 8. Mild metabolic acidosis probably combination of sepsis and AKI superimposed CKD 2 a. Holding ARB 2/2 hyperkalemia  b. CO2 is variable-continue to monitor  9. New diagnosis of prostate cancer 04/2020--Dr. Shadad cc'd a. Continue Rapaflo 8 mg every morning/Flomax 0.4 10. DM TY 2 with recent DKA a. Dysphagia 1 diet currently given vomiting with cough b. CBG 100-128 c. Currently on Lantus 10 units on discharge and can resume sliding scale 11. CVA seizure in the past with need for AFOs and gait  instability a. Temporarily hold statins fibrate given vomiting b. Continue Aggrenox twice daily c. Continue Keppra 500 twice daily p.o. but change to IV 12. Parkinson's a. Can continue carbidopa 2 tablets 3 times a day b. Monitor 13. Normocytic anemia with component of anemia from malignancy a. Outpatient management b. Hemoglobin is stable in the 9 range  DVT prophylaxis: Lovenox Code Status: Full code Family Communication: Long discussion bedside with patient regarding goals of care-I discussed this on several days with them and try to explain to them the difficulties with aspiration as well as the risk of pneumonia which will not go away given his prior stroke and Parkinson's They continue to wish full CODE STATUS-this may be reasonable and he may recover from this-he will be discharging with palliative care in the outpatient setting and this should flipped over to hospice level care if the patient continues to fail to thrive He is very high risk for readmission to the hospital if he does not do well at home as family has declined nursing home placement   Discharge Exam: Vitals:   06/26/20 0520 06/26/20 1342  BP: (!) 159/67 (!) 167/64  Pulse: 72 73  Resp: 18 18  Temp: 98.9 F (37.2 C) 98.5 F (36.9 C)  SpO2: 94% 96%    General: Awake coherent no distress tolerating fluids fine but coughs up and vomits eggs Wife has  several detailed questions which were answered at the bedside Patient himself looks well is quite coherent and understands what is going on He is still quite weak on his left side secondary to his prior stroke but otherwise is doing okay Cardiovascular: S1-S2 no murmur Respiratory: Clear posterolaterally on exam Skin exam-I examined the wound as per picture below it is 4 x 3 x 1 cm deep with a little bit of eschar around the superior aspect I do not appreciate any slough He is quite weak in his lower extremities and unable to [facility]           Discharge  Instructions   Discharge Instructions    Diet - low sodium heart healthy   Complete by: As directed    Discharge instructions   Complete by: As directed    You were diagnosed with aspiration pneumonia in the setting of probable aspiration secondary to your bad Parkinson's and difficulty coordinating swallowing We will check and see if you require oxygen and this can be ordered for you if you go below 90% Please complete Augmentin on 9/16 which would be 10 days total of antibiotics that you have had-it is very difficult to prevent aspiration-if you have fevers chills or increased sputum you may need to have a chest x-ray at your PCP office Ultimately you will also need to have repeat chest x-ray in about a week at your primary care office and you will need to have labs done at that time as well We discussed at great length earlier this hospital stay that if you continue to do well then you would be a reasonable candidate for hormonal therapy under the care of Dr. Lovena Neighbours however if you fail to do well then we may need to consider you discussing your care plan with the hospice physician as you have had significant morbidity in the past several weeks and you would probably not do well long-term to be able to tolerate any type of hormonal therapy if you continue to aspirate and do poorly Think about these things carefully however we will respect your wishes to be a full code and you should see your primary care physician as well as Dr. Lovena Neighbours to continue these discussions in the outpatient setting   Discharge wound care:   Complete by: As directed    Wound care  Every shift      Comments: Wound care to left buttock full thickness wound:  Cleanse with NS, pat dry.  Apply collagenase (Santyl) to necrotic tissue in wound. Fill defect with saline moistened gauze, top with dry gauze, ABD and secure with paper tape. Change twice daily and PRN for soiling. Turn patient side to side.   Increase activity slowly    Complete by: As directed      Allergies as of 06/26/2020      Reactions   Altace [ramipril] Cough      Medication List    STOP taking these medications   CoQ-10 50 MG Caps   Flaxseed (Linseed) 1000 MG Caps   hydrALAZINE 25 MG tablet Commonly known as: APRESOLINE   olmesartan 20 MG tablet Commonly known as: BENICAR   Vitamin D 50 MCG (2000 UT) Caps     TAKE these medications   acetaminophen 325 MG tablet Commonly known as: TYLENOL Take 2 tablets (650 mg total) by mouth every 6 (six) hours as needed for mild pain (or Fever >/= 101).   amLODipine 10 MG tablet Commonly known as: NORVASC TAKE ONE  TABLET BY MOUTH DAILY   amoxicillin-clavulanate 875-125 MG tablet Commonly known as: Augmentin Take 1 tablet by mouth 2 (two) times daily for 2 days.   carbidopa-levodopa 25-100 MG tablet Commonly known as: SINEMET IR Take 2 tablets by mouth 3 (three) times daily. Change: Take Two tabs 3 x daily   collagenase ointment Commonly known as: SANTYL Apply topically 2 (two) times daily.   dantrolene 50 MG capsule Commonly known as: DANTRIUM TAKE ONE CAPSULE BY MOUTH TWICE A DAY   dextromethorphan-guaiFENesin 30-600 MG 12hr tablet Commonly known as: MUCINEX DM Take 2 tablets by mouth 2 (two) times daily.   dipyridamole-aspirin 200-25 MG 12hr capsule Commonly known as: AGGRENOX TAKE ONE CAPSULE BY MOUTH TWICE A DAY   ezetimibe 10 MG tablet Commonly known as: ZETIA TAKE ONE TABLET BY MOUTH DAILY   fenofibrate 48 MG tablet Commonly known as: TRICOR TAKE ONE TABLET BY MOUTH DAILY   Folbic 2.5-25-2 MG Tabs tablet Generic drug: folic acid-pyridoxine-cyancobalamin Take 1 tablet by mouth once daily with food What changed: See the new instructions.   furosemide 20 MG tablet Commonly known as: LASIX Take 20 mg by mouth daily.   Gerhardt's butt cream Crea Apply 1 application topically 3 (three) times daily.   HumaLOG KwikPen 100 UNIT/ML KiwkPen Generic drug: insulin  lispro Inject 4 Units into the skin 3 (three) times daily as needed (high blood sugar > 200).   Keppra 500 MG tablet Generic drug: levETIRAcetam TAKE ONE TABLET BY MOUTH TWICE A DAY   metoprolol tartrate 25 MG tablet Commonly known as: LOPRESSOR TAKE ONE TABLET BY MOUTH DAILY   nitroGLYCERIN 0.4 MG SL tablet Commonly known as: NITROSTAT Place 1 tablet (0.4 mg total) under the tongue every 5 (five) minutes as needed for chest pain.   OneTouch Delica Lancets 10U Misc   OneTouch Verio test strip Generic drug: glucose blood   Rapaflo 8 MG Caps capsule Generic drug: silodosin Take 8 mg by mouth daily with breakfast.   rosuvastatin 20 MG tablet Commonly known as: CRESTOR TAKE ONE TABLET BY MOUTH DAILY   senna-docusate 8.6-50 MG tablet Commonly known as: Senokot-S Take 1 tablet by mouth once for 1 dose.   Toujeo SoloStar 300 UNIT/ML Solostar Pen Generic drug: insulin glargine (1 Unit Dial) Inject 10 Units into the skin daily. What changed: how much to take            Discharge Care Instructions  (From admission, onward)         Start     Ordered   06/26/20 0000  Discharge wound care:       Comments: Wound care  Every shift      Comments: Wound care to left buttock full thickness wound:  Cleanse with NS, pat dry.  Apply collagenase (Santyl) to necrotic tissue in wound. Fill defect with saline moistened gauze, top with dry gauze, ABD and secure with paper tape. Change twice daily and PRN for soiling. Turn patient side to side.   06/26/20 1723         Allergies  Allergen Reactions  . Altace [Ramipril] Cough    Follow-up Information    Ceasar Mons, MD. Schedule an appointment as soon as possible for a visit in 1 week.   Specialty: Urology Why: [facility] injection  Contact information: [address] [place] [address] [phone]                The results of significant diagnostics from this  hospitalization (including  imaging, microbiology, ancillary and laboratory) are listed below for reference.    Significant Diagnostic Studies: CT ABDOMEN PELVIS WO CONTRAST  Result Date: 06/24/2020 CLINICAL DATA:  Abdominal pain. EXAM: CT ABDOMEN AND PELVIS WITHOUT CONTRAST TECHNIQUE: Multidetector CT imaging of the abdomen and pelvis was performed following the standard protocol without IV contrast. COMPARISON:  June 11, 2020 FINDINGS: Lower chest: Multiple sternal wires are present. Predominant stable mild to moderate severity areas of atelectasis and/or infiltrate are seen within the bilateral lung bases. Small bilateral pleural effusions are also seen. These are increased in size when compared to the prior exam. Hepatobiliary: No focal liver abnormality is seen. Status post cholecystectomy. No biliary dilatation. Pancreas: Enlargement of the pancreatic head is noted which measures approximately 3.9 cm x 3.5 cm. The remaining visualized portion of the pancreatic parenchyma is unremarkable. Spleen: Normal in size without focal abnormality. Adrenals/Urinary Tract: Adrenal glands are unremarkable. Kidneys are normal in size, without renal calculi or hydronephrosis. A 2.1 cm diameter cyst is seen within the posterior aspect of the mid left kidney. Persistent mild to moderate severity bilateral perinephric inflammatory fat stranding is noted. A Foley catheter is seen within the urinary bladder. Mild diffuse urinary bladder wall thickening is also seen with a mild amount of surrounding inflammatory fat stranding. Stomach/Bowel: Stomach is within normal limits. Appendix appears normal. No evidence of bowel wall thickening, distention, or inflammatory changes. Vascular/Lymphatic: There is marked severity calcification of the abdominal aorta and bilateral common iliac arteries, without evidence of aneurysmal dilatation. No enlarged abdominal or pelvic lymph nodes. Reproductive: Mild to moderate severity prostate gland enlargement is seen.  Other: No abdominal wall hernia or abnormality. No abdominopelvic ascites. Musculoskeletal: Mild to moderate severity inflammatory fat stranding is seen within the left ischial anal fossa. There is no evidence of associated fluid collection or abscess. The soft tissue air seen within this region on the prior study is no longer present. Numerous heterogeneous sclerotic foci are seen throughout the thoracolumbar spine and bony pelvis. IMPRESSION: 1. Mild to moderate severity residual inflammatory fat stranding within the left ischioanal fossa without evidence of associated fluid collection, soft tissue air or abscess. 2. Numerous heterogeneous sclerotic foci throughout the thoracolumbar spine and bony pelvis, consistent with osseous metastasis. 3. Enlargement of the pancreatic head which measures approximately 3.9 cm x 3.5 cm. MRI correlation is recommended to further exclude the presence of an underlying pancreatic mass. 4. Small bilateral pleural effusions, increased in size when compared to the prior exam. 5. Stable, mild to moderate severity bibasilar atelectasis and/or infiltrate. 6. Enlarged prostate gland. 7. Mild diffuse urinary bladder wall thickening with a mild amount of surrounding inflammatory fat stranding. While this may represent sequelae associated with cystitis, correlation with urinalysis is recommended. 8. 2.1 cm left renal cyst. 9. Aortic atherosclerosis. Aortic Atherosclerosis (ICD10-I70.0). Electronically Signed   By: Virgina Norfolk M.D.   On: 06/24/2020 22:50   CT ABDOMEN PELVIS WO CONTRAST  Addendum Date: 06/11/2020   ADDENDUM REPORT: 06/11/2020 21:43 ADDENDUM: Few borderline enlarged retroperitoneal lymph nodes, stable to decreased in size from comparison imaging. Electronically Signed   By: Lovena Le M.D.   On: 06/11/2020 21:43   Result Date: 06/11/2020 CLINICAL DATA:  Acute renal failure, metastatic prostate cancer and perianal abscess EXAM: CT ABDOMEN AND PELVIS WITHOUT  CONTRAST TECHNIQUE: Multidetector CT imaging of the abdomen and pelvis was performed following the standard protocol without IV contrast. COMPARISON:  Wide for abscess cysts cough price FINDINGS: Lower chest: Small  bilateral effusions. Adjacent areas of passive atelectasis. Underlying consolidation not excluded. Normal cardiac size. Pacer leads appear directed towards the coronary sinus. Coronary artery atherosclerosis is noted as well. Evidence of prior sternotomy and CABG. Hypoattenuation of the cardiac blood pool suggestive of anemia. Hepatobiliary: Borderline hepatomegaly similar to comparison. Smooth liver surface contour. No visible focal liver lesion on this unenhanced CT. Post cholecystectomy. No significant biliary ductal dilatation or visible intraductal gallstones. Pancreas: Stable atrophy of the pancreatic body and tail. No pancreatic ductal dilatation or surrounding inflammatory changes. Spleen: Normal in size. No concerning splenic lesions. Small accessory splenule. Adrenals/Urinary Tract: Lobular thickening of the adrenal glands, similar to comparison studies, without discernible focal adrenal lesion. Moderate bilateral perinephric stranding, overall similar to comparison CT from August 2021. Bilateral renal hilar calcifications are likely vascular in nature without discernible urolithiasis or hydronephrosis. Bladder decompressed by inflated Foley catheter balloon albeit with some mild wall thickening and perivesicular hazy stranding, nonspecific in the setting of instrumentation. Stomach/Bowel: Distal esophagus, stomach and duodenal sweep are unremarkable. No small bowel wall thickening or dilatation. No evidence of obstruction. Appendix is not visualized. Normal caliber appendix is present though does course in the vicinity of adjacent free fluid in the right pericolic gutter. Question some mild mural thickening of the colon versus underdistention. Perianal gas and possible fistulous connection, as  detailed below. Vascular/Lymphatic: Atherosclerotic calcifications within the abdominal aorta and branch vessels. No aneurysm or ectasia. Stable appearance of a borderline enlarged right retrocaval lymph node measuring 1.1 cm (2/35) diminished conspicuity of a right external iliac lymph node now measuring only 7 mm in size, previously 0.9 cm (2/69) stable appearance of a separate borderline enlarged right external iliac node measuring up to 1.1 cm short axis (2/80). No other concerning adenopathy. Reproductive: Mild prostatomegaly, traversed by the inflated Foley catheter balloon. Lobular thickening of the seminal vesicles is a nonspecific finding. No acute abnormality of the external genitalia. Soft tissue gas and stranding extends to the base of the left penile corpora and spongiosum. Other: Soft tissue thickening and perianal abscess with soft tissue defect of the left ischioanal fossa (2/97. A tract of gas lucencies extends from the soft tissue thickening seen just laterally to the base of the left penile corpora cavernosum and spongiosum anteriorly and to the margin of the thickened levator plate with a small amount of intersphincteric low-attenuation fluid and gas seen on coronal imaging (4/90). Small volume of low-attenuation free fluid seen layering in the pericolic gutters and pelvis, nonspecific. Moderate body wall edema. Musculoskeletal: Multilevel sclerotic lesions present throughout the included thoracolumbar spine and bony pelvis. Overall extent and distribution similar to comparison. Multilevel degenerative changes in the spine hips and pelvis as well as the left wrist, partially included within the level of imaging. IMPRESSION: 1. Soft tissue thickening and perianal ulcer with soft tissue defect of the left ischioanal fossa. 2. A tract of gas lucency is seen extending from the region of skin thickening and defect lateral to the ulcer base anteromedially to the left penile corpora cavernosum and  spongiosum anteriorly and to the margin of the thickened left levator plate with a small amount of intersphincteric low-attenuation fluid and gas, concerning fistulous connection. A discrete abscess/collection is not discernible at this time. 3. Question some mild mural thickening of the colon versus underdistention of the cecum. Correlate with clinical symptoms and consider direct visualization. 4. Small volume of low-attenuation free fluid seen layering in the pericolic gutters and pelvis, nonspecific. 5. Small bilateral effusions with adjacent  areas of passive atelectasis. Underlying consolidation not excluded. 6. Moderate body wall edema. Correlate for features of anasarca/volume third-spacing. 7. Multilevel sclerotic lesions throughout the included thoracolumbar spine and bony pelvis, consistent with metastatic disease. 8. Mild prostatomegaly and lobular thickening of the seminal vesicles. May correlate with history of patient's prostate cancer. 9. Moderate bilateral perinephric stranding, overall similar to comparison CT from August 2021. Findings could reflect senescent change, however, if urinary symptoms are present recommend correlation with urinalysis to exclude infection. 10. Aortic Atherosclerosis (ICD10-I70.0). Electronically Signed: By: Lovena Le M.D. On: 06/11/2020 20:55   DG Chest 2 View  Result Date: 06/21/2020 CLINICAL DATA:  Pneumonia.  Shortness of breath. EXAM: CHEST - 2 VIEW COMPARISON:  06/19/2020 FINDINGS: Sequelae of CABG are again identified. The cardiomediastinal silhouette is unchanged with normal heart size. Right mid lung and bilateral lower lung airspace opacities are unchanged. There are small bilateral pleural effusions. No pneumothorax is identified. IMPRESSION: Unchanged bilateral airspace disease which could reflect pneumonia or edema with small pleural effusions. Electronically Signed   By: Logan Bores M.D.   On: 06/21/2020 09:18   NM Bone Scan Whole Body  Result  Date: 06/06/2020 CLINICAL DATA:  Prostate cancer, fell on 06/06/2020 EXAM: NUCLEAR MEDICINE WHOLE BODY BONE SCAN TECHNIQUE: Whole body anterior and posterior images were obtained approximately 3 hours after intravenous injection of radiopharmaceutical. RADIOPHARMACEUTICALS:  21.6 mCi Technetium-48m MDP IV COMPARISON:  None Correlation: CT chest abdomen pelvis 06/06/2020 FINDINGS: Multiple sites of abnormal osseous tracer uptake are seen consistent with osseous metastases. These include BILATERAL ribs, thoracic spine, lumbar spine, and pelvis. Degenerative type uptake of tracer at the shoulders, elbows, and knees. Expected urinary tract and soft tissue distribution of tracer. IMPRESSION: Osseous metastatic disease as above. Electronically Signed   By: Lavonia Dana M.D.   On: 06/06/2020 18:02   DG Chest Portable 1 View  Result Date: 06/19/2020 CLINICAL DATA:  Shortness of breath, concern for pneumonia. Productive cough. EXAM: PORTABLE CHEST 1 VIEW COMPARISON:  Chest x-ray 06/11/2020. FINDINGS: Interval removal of a right internal jugular central venous catheter. Vascular surgical clips overlying the mediastinum. Intact sternotomy wires are again identified. The heart size is unchanged. Silhouetting off of the right and left cardiac borders as well as the medial left hemidiaphragm due to pulmonary findings. Bilateral mid to lower lung zone patchy airspace opacity. Interval development of retrocardiac opacity. No acute osseous finding. IMPRESSION: Interval development of multifocal pneumonia. Electronically Signed   By: Iven Finn M.D.   On: 06/19/2020 13:08   DG Chest Port 1 View  Result Date: 06/11/2020 CLINICAL DATA:  Central line placement EXAM: PORTABLE CHEST 1 VIEW COMPARISON:  Radiograph 06/11/2020, CT 06/06/2020 FINDINGS: Right IJ catheter tip terminates at the level of the right atrium. Prior sternotomy and features of CABG. Stable cardiomediastinal contours. Atelectatic changes and vascular crowding  are similar to comparison. Some abundant mediastinal and subpleural fat is similar to comparison CT. No convincing pneumothorax or effusion. No new focal consolidative opacity or features of edema. No acute osseous or soft tissue abnormality. Telemetry leads overlie the chest. Surgical clips at the base of the neck and evidence of left carotid stenting. IMPRESSION: 1. Right IJ catheter tip terminates at the level of the right atrium. 2. No convincing pneumothorax. 3. Stable bilateral atelectasis and vascular crowding. Electronically Signed   By: Lovena Le M.D.   On: 06/11/2020 20:05   DG Chest Port 1 View  Result Date: 06/11/2020 CLINICAL DATA:  Dyspnea EXAM: PORTABLE CHEST  1 VIEW COMPARISON:  [id number] FINDINGS: Minimal left basilar atelectasis. Lungs are otherwise clear. No pneumothorax or pleural effusion. Coronary artery bypass grafting has been performed. Cardiac size is within normal limits. Pulmonary vascularity is normal. No acute bone abnormality. IMPRESSION: No active disease. Electronically Signed   By: Fidela Salisbury MD   On: 06/11/2020 16:37   DG Abd 2 Views  Result Date: 06/25/2020 CLINICAL DATA:  Nausea and vomiting with eating EXAM: X-RAY ABDOMEN 2 VIEWS COMPARISON:  Abdomen and pelvis CT from yesterday FINDINGS: Normal bowel gas pattern. No concerning mass effect or gas collection. Atheromatous calcification of the aorta, iliacs, and branch vessels. Negative for pneumoperitoneum when decubitus. Cholecystectomy clips. IMPRESSION: Normal bowel gas pattern.  No evidence of pneumoperitoneum. Electronically Signed   By: Monte Fantasia M.D.   On: 06/25/2020 10:12   DG ABD ACUTE 2+V W 1V CHEST  Result Date: 06/23/2020 CLINICAL DATA:  Small-bowel obstruction. EXAM: X-RAY ABDOMEN 3 VIEW COMPARISON:  CT scan 06/11/2020 FINDINGS: The upright chest x-ray demonstrates improving lung aeration with resolving infiltrates/atelectasis and effusions. Two views of the abdomen demonstrate scattered air  throughout the small bowel and colon suggesting gastroenteritis or mild ileus. No significant bowel distention. No findings for small bowel obstruction or free air. The soft tissue shadows are maintained. Advanced vascular calcifications. The bony structures are intact. IMPRESSION: 1. Improving lung aeration with resolving infiltrates/atelectasis and effusions. 2. Possible gastroenteritis or mild ileus. Electronically Signed   By: Marijo Sanes M.D.   On: 06/23/2020 12:11   ECHOCARDIOGRAM COMPLETE  Result Date: 06/13/2020    ECHOCARDIOGRAM REPORT   Patient Name:   BOE DEANS Date of Exam: 06/13/2020 Medical Rec #:  [id number]       Height:       64.0 in Accession #:    [id number]      Weight:       175.0 lb Date of Birth:  13-Oct-1946       BSA:          1.849 m Patient Age:    89 years        BP:           95/44 mmHg Patient Gender: M               HR:           72 bpm. Exam Location:  Inpatient Procedure: 2D Echo, 3D Echo and Strain Analysis Indications:    Cardiomyopathy-Unspecified 425.9 / I42.9  History:        Patient has prior history of Echocardiogram examinations. CHF,                 CAD and Previous Myocardial Infarction, Prior CABG, PAD,                 Signs/Symptoms:Hypotension; Risk Factors:Hypertension and                 Dyslipidemia. Septic shock, Acute kidney injury, Anemia,                 Abscess.  Sonographer:    Darlina Sicilian RDCS Referring Phys: [id number] [place]  1. Left ventricular ejection fraction, by estimation, is 60 to 65%. The left ventricle has normal function. The left ventricle has no regional wall motion abnormalities. Left ventricular diastolic parameters are consistent with Grade II diastolic dysfunction (pseudonormalization). Elevated left atrial pressure.  2. Right ventricular systolic function is normal. The right ventricular size is normal. There  is mildly elevated pulmonary artery systolic pressure. The estimated right ventricular systolic pressure  is 14.4 mmHg.  3. Left atrial size was moderately dilated.  4. The mitral valve is normal in structure. Mild mitral valve regurgitation. No evidence of mitral stenosis.  5. Tricuspid valve regurgitation is mild to moderate.  6. The aortic valve is normal in structure. Aortic valve regurgitation is not visualized. Mild aortic valve sclerosis is present, with no evidence of aortic valve stenosis.  7. The inferior vena cava is normal in size with greater than 50% respiratory variability, suggesting right atrial pressure of 3 mmHg. FINDINGS  Left Ventricle: Left ventricular ejection fraction, by estimation, is 60 to 65%. The left ventricle has normal function. The left ventricle has no regional wall motion abnormalities. The left ventricular internal cavity size was normal in size. There is  no left ventricular hypertrophy. Abnormal (paradoxical) septal motion consistent with post-operative status. Left ventricular diastolic parameters are consistent with Grade II diastolic dysfunction (pseudonormalization). Elevated left atrial pressure. Right Ventricle: The right ventricular size is normal. No increase in right ventricular wall thickness. Right ventricular systolic function is normal. There is mildly elevated pulmonary artery systolic pressure. The tricuspid regurgitant velocity is 3.11  m/s, and with an assumed right atrial pressure of 3 mmHg, the estimated right ventricular systolic pressure is 31.5 mmHg. Left Atrium: Left atrial size was moderately dilated. Right Atrium: Right atrial size was normal in size. Pericardium: There is no evidence of pericardial effusion. Mitral Valve: The mitral valve is normal in structure. Normal mobility of the mitral valve leaflets. Mild mitral valve regurgitation. No evidence of mitral valve stenosis. Tricuspid Valve: The tricuspid valve is normal in structure. Tricuspid valve regurgitation is mild to moderate. No evidence of tricuspid stenosis. Aortic Valve: The aortic valve is  normal in structure. Aortic valve regurgitation is not visualized. Mild aortic valve sclerosis is present, with no evidence of aortic valve stenosis. Pulmonic Valve: The pulmonic valve was normal in structure. Pulmonic valve regurgitation is not visualized. No evidence of pulmonic stenosis. Aorta: The aortic root is normal in size and structure. Venous: The inferior vena cava is normal in size with greater than 50% respiratory variability, suggesting right atrial pressure of 3 mmHg. IAS/Shunts: No atrial level shunt detected by color flow Doppler.  LEFT VENTRICLE PLAX 2D LVIDd:         4.60 cm  Diastology LVIDs:         2.80 cm  LV e' lateral:   9.36 cm/s LV PW:         0.90 cm  LV E/e' lateral: 12.7 LV IVS:        1.10 cm  LV e' medial:    6.09 cm/s LVOT diam:     2.10 cm  LV E/e' medial:  19.5 LV SV:         74 LV SV Index:   40 LVOT Area:     3.46 cm                          3D Volume EF:                         3D EF:        66 %                         LV EDV:  129 ml                         LV ESV:       43 ml                         LV SV:        85 ml RIGHT VENTRICLE RV S prime:     11.90 cm/s TAPSE (M-mode): 1.5 cm LEFT ATRIUM             Index       RIGHT ATRIUM           Index LA diam:        4.80 cm 2.60 cm/m  RA Area:     10.20 cm LA Vol (A2C):   55.3 ml 29.91 ml/m RA Volume:   15.60 ml  8.44 ml/m LA Vol (A4C):   48.1 ml 26.02 ml/m LA Biplane Vol: 52.2 ml 28.24 ml/m  AORTIC VALVE LVOT Vmax:   95.10 cm/s LVOT Vmean:  64.800 cm/s LVOT VTI:    0.213 m  AORTA Ao Root diam: 3.10 cm MITRAL VALVE                TRICUSPID VALVE MV Area (PHT): 4.49 cm     TR Peak grad:   38.7 mmHg MV Decel Time: 169 msec     TR Vmax:        311.00 cm/s MV E velocity: 119.00 cm/s MV A velocity: 63.00 cm/s   SHUNTS MV E/A ratio:  1.89         Systemic VTI:  0.21 m                             Systemic Diam: 2.10 cm Dani Gobble Croitoru MD Electronically signed by Sanda Klein MD Signature Date/Time: 06/13/2020/12:56:22 PM     Final     Microbiology: Recent Results (from the past 240 hour(s))  Blood culture (routine x 2)     Status: None   Collection Time: 06/19/20  1:08 PM   Specimen: BLOOD  Result Value Ref Range Status   Specimen Description   Final    BLOOD SITE NOT SPECIFIED Performed at [facility], [address], [place], [place] [address]    Special Requests   Final    BOTTLES DRAWN AEROBIC AND ANAEROBIC Blood Culture results may not be optimal due to an excessive volume of blood received in culture bottles Performed at [place] [address] [place], [place] [address]    Culture   Final    NO GROWTH 5 DAYS Performed at [facility], [place] [address] [place], [place] [address]    Report Status 06/24/2020 FINAL  Final  Blood culture (routine x 2)     Status: None   Collection Time: 06/19/20  1:08 PM   Specimen: BLOOD RIGHT HAND  Result Value Ref Range Status   Specimen Description   Final    BLOOD RIGHT HAND Performed at [place] [address] [place], [place] [address]    Special Requests   Final    BOTTLES DRAWN AEROBIC ONLY Blood Culture results may not be optimal due to an inadequate volume of blood received in culture bottles Performed at [place] [address], [place], [place] [address]    Culture  Final    NO GROWTH 5 DAYS Performed at [facility], [place] [address] [place], [place] [address]    Report Status 06/24/2020 FINAL  Final  Culture, Urine     Status: Abnormal   Collection Time: 06/19/20  1:08 PM   Specimen: Urine, Clean Catch  Result Value Ref Range Status   Specimen Description   Final    URINE, CLEAN CATCH Performed at [facility], [place] [address], [place], [place] [address]    Special Requests   Final    NONE Performed at [facility], [place] [address], [place], [place] [address]    Culture MULTIPLE SPECIES PRESENT, SUGGEST  RECOLLECTION (A)  Final   Report Status 06/20/2020 FINAL  Final  SARS Coronavirus 2 by RT PCR (hospital order, performed in [facility] hospital lab) Nasopharyngeal Nasopharyngeal Swab     Status: None   Collection Time: 06/19/20  1:34 PM   Specimen: Nasopharyngeal Swab  Result Value Ref Range Status   SARS Coronavirus 2 NEGATIVE NEGATIVE Final    Comment: (NOTE) SARS-CoV-2 target nucleic acids are NOT DETECTED.  The SARS-CoV-2 RNA is generally detectable in upper and lower respiratory specimens during the acute phase of infection. The lowest concentration of SARS-CoV-2 viral copies this assay can detect is 250 copies / mL. A negative result does not preclude SARS-CoV-2 infection and should not be used as the sole basis for treatment or other patient management decisions.  A negative result may occur with improper specimen collection / handling, submission of specimen other than nasopharyngeal swab, presence of viral mutation(s) within the areas targeted by this assay, and inadequate number of viral copies (<250 copies / mL). A negative result must be combined with clinical observations, patient history, and epidemiological information.  Fact Sheet for Patients:   StrictlyIdeas.no  Fact Sheet for Healthcare Providers: BankingDealers.co.za  This test is not yet approved or  cleared by the [place] FDA and has been authorized for detection and/or diagnosis of SARS-CoV-2 by FDA under an Emergency Use Authorization (EUA).  This EUA will remain in effect (meaning this test can be used) for the duration of the COVID-19 declaration under Section 564(b)(1) of the Act, 21 U.S.C. section 360bbb-3(b)(1), unless the authorization is terminated or revoked sooner.  Performed at [facility], [place] [address], [place], [place] [address]   C Difficile Quick Screen w PCR reflex     Status: None   Collection Time: 06/20/20   5:00 AM   Specimen: STOOL  Result Value Ref Range Status   C Diff antigen NEGATIVE NEGATIVE Final   C Diff toxin NEGATIVE NEGATIVE Final   C Diff interpretation No C. difficile detected.  Final    Comment: Performed at [facility], [place] [address] [place], [place] [address]  MRSA PCR Screening     Status: None   Collection Time: 06/20/20  1:10 PM   Specimen: Nasal Mucosa; Nasopharyngeal  Result Value Ref Range Status   MRSA by PCR NEGATIVE NEGATIVE Final    Comment:        The GeneXpert MRSA Assay (FDA approved for NASAL specimens only), is one component of a comprehensive MRSA colonization surveillance program. It is not intended to diagnose MRSA infection nor to guide or monitor treatment for MRSA infections. Performed at [facility], [place] [address] [place], [place] [address]      Labs: Basic Metabolic Panel: Recent Labs  Lab 06/22/20 0500 06/23/20  0532 06/24/20 0534 06/25/20 0825 06/26/20 0833  NA 135 137 135 135 139  K 4.5 4.5 4.4 5.0 4.8  CL 104 103 103 103 103  CO2 18* 19* 21* 20* 22  GLUCOSE 104* 108* 122* 140* 113*  BUN 34* 33* 27* 28* 25*  CREATININE 1.24 1.16 1.03 1.05 1.11  CALCIUM 9.2 9.2 9.5 9.4 9.5   Liver Function Tests: Recent Labs  Lab 06/21/20 0519 06/22/20 0500 06/23/20 0532 06/25/20 0825 06/26/20 0833  AST 18 17 16 18  14*  ALT 6 8 7 5 6   ALKPHOS 66 65 68 138* 71  BILITOT 0.5 0.5 0.7 0.6 0.6  PROT 5.7* 6.3* 6.4* 6.3* 6.5  ALBUMIN 2.5* 2.8* 2.8* 2.9* 3.1*   No results for input(s): LIPASE, AMYLASE in the last 168 hours. No results for input(s): AMMONIA in the last 168 hours. CBC: Recent Labs  Lab 06/21/20 0519 06/22/20 0500 06/23/20 0532 06/25/20 0825 06/26/20 0833  WBC 10.0 10.7* 9.3 7.9 8.0  NEUTROABS 7.4 8.3* 7.4 5.8 5.9  HGB 9.3* 10.0* 9.3* 9.8* 10.0*  HCT 29.4* 32.4* 30.3* 31.0* 31.9*  MCV 95.8 95.6 96.5 94.5 96.1  PLT 275 289 275 269 270   Cardiac Enzymes: No results for  input(s): CKTOTAL, CKMB, CKMBINDEX, TROPONINI in the last 168 hours. BNP: BNP (last 3 results) Recent Labs    08/27/19 0257 06/11/20 1434 06/19/20 1308  BNP 381.5* 1,002.8* 2,077.1*    ProBNP (last 3 results) No results for input(s): PROBNP in the last 8760 hours.  CBG: Recent Labs  Lab 06/25/20 1652 06/25/20 2041 06/26/20 0743 06/26/20 1154 06/26/20 1651  GLUCAP 89 179* 105* 121* 109*       Signed:  Nita Sells MD   [facility] 06/26/2020, 5:23 PM

## 2020-06-26 NOTE — Progress Notes (Signed)
Physical Therapy Treatment Patient Details Name: [name] MRN: [id number] DOB: 14-May-1946 Today's Date: 06/26/2020    History of Present Illness 74 yo with h/o prostate cancer with bone mets on palliative treatment admitted to [facility] 06/19/20 with respiratory failure with findings of heart failure and pna.  Pt was recently in the hospital 8/30-9/6 with septic shock from his perirectal abscess.  Pt has PMH significant for CAD, CABG, stroke with Lt sided weakness, Parkinsonism, and has had substantial weight loss.    PT Comments    Pt assisted with performing 3 sit to stands. Pt continues to require mod-max assist +2 for mobility.  Pt with BM upon last sit to stand so assisted back to supine, and nursing notified of need for hygiene.  Follow Up Recommendations  Supervision/Assistance - 24 hour;SNF     Equipment Recommendations  Other (comment) (low air loss mattress, lift)    Recommendations for Other Services       Precautions / Restrictions Precautions Precautions: Fall Precaution Comments: hx of Lt hemiparesis; pt has AFO and special shoes for mobilizing  Spouse reports AFO and shoes in the car so did not use today.   Mobility  Bed Mobility Overal bed mobility: Needs Assistance Bed Mobility: Supine to Sit;Sit to Supine;Rolling Rolling: Mod assist   Supine to sit: Mod assist;+2 for physical assistance Sit to supine: Max assist;+2 for physical assistance   General bed mobility comments: pt assisted with moving LEs over EOB, assist required for trunk upright and scooting to EOB, more assist for return to bed due to fatigue  Transfers Overall transfer level: Needs assistance Equipment used: 2 person hand held assist Transfers: Sit to/from Stand Sit to Stand: Mod assist;Max assist         General transfer comment: pt provided UE support and therapist blocked right knee (taking most of the weight bearing), pt assisted with sit to stands x3, not able to remain standing  more then 20 seconds, fatigued quickly  Ambulation/Gait                 Stairs             Wheelchair Mobility    Modified Rankin (Stroke Patients Only)       Balance Overall balance assessment: Needs assistance Sitting-balance support: Feet supported;Single extremity supported Sitting balance-Leahy Scale: Poor Sitting balance - Comments: one UE support   Standing balance support: Bilateral upper extremity supported Standing balance-Leahy Scale: Zero                              Cognition Arousal/Alertness: Awake/alert Behavior During Therapy: WFL for tasks assessed/performed;Flat affect Overall Cognitive Status: Within Functional Limits for tasks assessed                                 General Comments: pt with flat affect but continues to be pleasant and oriented x3, he remains motivated and eager to work with therapy.      Exercises      General Comments        Pertinent Vitals/Pain Pain Assessment: No/denies pain    Home Living                      Prior Function            PT Goals (current goals can now be found in  the care plan section) Progress towards PT goals: Progressing toward goals    Frequency    Min 3X/week      PT Plan Current plan remains appropriate    Co-evaluation              AM-PAC PT "6 Clicks" Mobility   Outcome Measure  Help needed turning from your back to your side while in a flat bed without using bedrails?: A Lot Help needed moving from lying on your back to sitting on the side of a flat bed without using bedrails?: A Lot Help needed moving to and from a bed to a chair (including a wheelchair)?: Total Help needed standing up from a chair using your arms (e.g., wheelchair or bedside chair)?: Total Help needed to walk in hospital room?: Total Help needed climbing 3-5 steps with a railing? : Total 6 Click Score: 8    End of Session Equipment Utilized During  Treatment: Gait belt;Oxygen Activity Tolerance: Patient tolerated treatment well Patient left: in bed;with call bell/phone within reach;with family/visitor present Nurse Communication: Mobility status PT Visit Diagnosis: Difficulty in walking, not elsewhere classified (R26.2);Other abnormalities of gait and mobility (R26.89);Muscle weakness (generalized) (M62.81);History of falling (Z91.81)     Time: 0950-1009 PT Time Calculation (min) (ACUTE ONLY): 19 min  Charges:  $Therapeutic Activity: 8-22 mins                     Jannette Spanner PT, DPT Acute Rehabilitation Services Pager: [phone] Office: [phone]  York Ram E 06/26/2020, 1:33 PM

## 2020-06-26 NOTE — Consult Note (Signed)
Reason for Consult: Dysphagia Referring Physician: Treynor HPI:  This is a 74 year old male with a PMH of Parkinson's disease, HFpEF, widely metastatic prostate cancer, CKD, history of CVA of his right MCA in 1999, DM, and seizure disorder admitted for a multifocal pneumonia secondary to aspiration.  Briefly, he was admitted from 06/11/2020-06/18/2020 with septic shock.  He was identified to have a perirectal abscess and DKA.  Conservative management with supportive care allowed him to be discharged home until 06/19/2020.  His ex-wife noted that he was suffering with a cough and SOB.  His pulse ox on admission was 85% on RA.  His BNP was also noted to be elevated at 2077 and he was readmitted.  During this hospitalization he developed an ileus and there was an incidental finding of a new pancreatic head mass, which was not present on the 06/11/2020 CT scan.  The main GI concern is his dysphagia.  Speech Path evaluated the patient on 06/20/2020 and he was deemed to be a moderate aspiration risk.  During the evaluation the patient was able to tolerate apple sauce and graham cracker boluses, however, the patient does require supervision.  Slow small sips and bites were recommended while sitting upright.  Past Medical History:  Diagnosis Date  . At high risk for falls   . Bladder cancer [facility])    urologist-  dr Gaynelle Arabian  . CAD (coronary artery disease) cardiologist-  dr berry  . Cerebrovascular arteriosclerosis   . Gait disturbance, post-stroke    uses cane, rollator, and w/c long distance  . Hemiparkinsonism [facility]) neurologist-- dr Leta Baptist   right side body  . History of basal cell carcinoma excision    Sept 2016--  MOH's surgery side of nose  . History of carotid artery stenosis cardiologist-- dr berry   bilateral --  1999 s/p  bilateral ICA endarterectomy and recurrent restenosis 2012  s/p  staged bilateral stenting   per last duplex 2015  stents widely patent  . History of  CVA with residual deficit neurologist-  dr Leta Baptist   3/ 1999  Right MCA  and  12/ 2004  Anterior division of  Right MCA ---  residual left spactic hemiparesis and left foot drop  . History of MI (myocardial infarction)    02/ 2004   s/p  cabg   . HOH (hard of hearing)    LEFT EAR  POST cva  . Hyperextension deformity of left knee    wears brace  . Hyperlipidemia   . Hypertension   . Left foot drop    residual from CVA  -- wears brace  . Left spastic hemiparesis ([place])    residual CVA 1999  . Pancreatic pseudocyst   . Peripheral arterial disease ([place])   . S/P CABG x 4    02/ 2004  . Seizure disorder [facility]) last seizure 2011 due to confusion   neurologist-  dr Leta Baptist-- per note seizure documented 2008 breakthrough partial complex seizure (prior tonic-clonic seizure's post CVA)  . Type 2 diabetes, diet controlled ([place])     2  . Visual neglect    LEFT EYE    Past Surgical History:  Procedure Laterality Date  . CARDIAC CATHETERIZATION  11/17/2002   significant left main disease and 3 vessel CAD, mildly depressed LV systolic  fx, 40% left renal artery stenosis  . CAROTID ENDARTERECTOMY  12/1997   [facility])   Bilateral ICA  . CAROTID STENT INSERTION Bilateral dr berry and  dr Kristine Royal--  (In-stent Restenosis)  LeftICA   02-12-2011;  RightICA   02-26-2011  . CORONARY ARTERY BYPASS GRAFT  2/ 2004   dr hendrickson   x3 LIMA to LAD,free right internal mammary artery to obtuse marginal 1,SVG to distal right coronary.  . CYSTOSCOPY W/ RETROGRADES Bilateral 09/10/2015   Procedure: CYSTOSCOPY WITH RETROGRADE PYELOGRAM;  Surgeon: Carolan Clines, MD;  [facility];  Service: Urology;  Laterality: Bilateral;  . CYSTOSCOPY WITH BIOPSY N/A 09/10/2015   Procedure: CYSTOSCOPY WITH BIOPSY,RIGHT TRIGONAL TUMOR 1.5 CM, EXCISIONAL BIOPSY WITH TAUBER FORCEP RIGHT POSTERIOR BLADDER WALL 1 CM, EXCISIONAL BIOPSY SATELITE BLADDER WALL 1 CM;  Surgeon: Carolan Clines, MD;   Location: [facility];  Service: Urology;  Laterality: N/A;  . CYSTOSCOPY WITH BIOPSY N/A 03/26/2017   Procedure: CYSTOSCOPY WITH BIOPSY/;  Surgeon: Carolan Clines, MD;  [facility];  Service: Urology;  Laterality: N/A;  . CYSTOSCOPY WITH HYDRODISTENSION AND BIOPSY N/A 10/29/2015   Procedure: REPEAT CYSTOSCOPY BIOPSY BLADDER DEEP MUSCLES BLADDER BIOPSY;  Surgeon: Carolan Clines, MD;  Location: [place];  Service: Urology;  Laterality: N/A;  . CYSTOSCOPY WITH URETHRAL DILATATION N/A 09/10/2015   Procedure: CYSTOSCOPY WITH URETHRAL DILATATION;  Surgeon: Carolan Clines, MD;  Location: [place];  Service: Urology;  Laterality: N/A;  . FULGURATION OF BLADDER TUMOR N/A 03/26/2017   Procedure: FULGURATION OF BLADDER TUMOR;  Surgeon: Carolan Clines, MD;  [facility];  Service: Urology;  Laterality: N/A;  . [place]  . MOHS SURGERY  06-20-2015   side of nose  . NM MYOCAR PERF WALL MOTION  01/09/2011   dr berry   mild to mod. perfusion defect in the basal inferolateral & mid inferolaterl regions consistant with an infarct/scar, no signigicant ischemia demonstracted/  Low Risk scan, no sig. change from previous study/  normal LV function and wall motion , ef 63%  . ORIF RIGHT ANKLE FX  1998   hardware retained  . TRANSURETHRAL RESECTION OF BLADDER TUMOR N/A 09/10/2015   Procedure: CYSTO TRANSURETHRAL RESECTION OF BLADDER TUMOR (TURBT) OF LEFT POSTERIOR BLADDER TUMOR 2 CM;  Surgeon: Carolan Clines, MD;  Location: [facility];  Service: Urology;  Laterality: N/A;  . TRANSURETHRAL RESECTION OF BLADDER TUMOR Left 04/17/2016   Procedure: TRANSURETHRAL RESECTION OF BLADDER TUMOR (TURBT);  Surgeon: Carolan Clines, MD;  [facility];  Service: Urology;  Laterality: Left;  . US ECHOCARDIOGRAPHY  01/03/2011   normal LVF, ef>55%/  mild LAE/ trace MR and TR,  mild AV sclerosis without stenosis     Family History  Problem Relation Age of Onset  . Stroke Father   . Heart attack Father   . Stroke Mother   . Diabetes Brother   . Hyperlipidemia Brother   . Hypertension Brother   . Hyperlipidemia Sister   . Hypertension Sister     Social History:  reports that he quit smoking about 12 years ago. His smoking use included cigarettes. He quit after 5.00 years of use. He has never used smokeless tobacco. He reports that he does not drink alcohol and does not use drugs.  Allergies:  Allergies  Allergen Reactions  . Altace [Ramipril] Cough    Medications:  Scheduled: . amLODipine  10 mg Oral Daily  . amoxicillin-clavulanate  1 tablet Oral Q12H  . carbidopa-levodopa  2 tablet Oral TID  . collagenase   Topical BID  . dantrolene  50 mg Oral BID  .  dextromethorphan-guaiFENesin  2 tablet Oral BID  . dipyridamole-aspirin  1 capsule Oral BID  . enoxaparin (LOVENOX) injection  40 mg Subcutaneous Q24H  . furosemide  20 mg Oral Daily  . Gerhardt's butt cream   Topical TID  . insulin aspart  0-9 Units Subcutaneous TID WC  . insulin glargine  10 Units Subcutaneous QHS  . levETIRAcetam  500 mg Oral BID  . metoprolol tartrate  25 mg Oral Daily  . polyethylene glycol  17 g Oral Daily  . senna-docusate  1 tablet Oral Once  . sodium chloride flush  3 mL Intravenous Q12H  . tamsulosin  0.4 mg Oral Daily   Continuous:   Results for orders placed or performed during the hospital encounter of 06/19/20 (from the past 24 hour(s))  Glucose, capillary     Status: None   Collection Time: 06/25/20  4:52 PM  Result Value Ref Range   Glucose-Capillary 89 70 - 99 mg/dL  Glucose, capillary     Status: Abnormal   Collection Time: 06/25/20  8:41 PM  Result Value Ref Range   Glucose-Capillary 179 (H) 70 - 99 mg/dL  CBC with Differential/Platelet     Status: Abnormal   Collection Time: 06/26/20  8:33 AM  Result Value Ref Range   WBC 8.0 4.0 - 10.5 K/uL   RBC 3.32 (L) 4.22 - 5.81 MIL/uL    Hemoglobin 10.0 (L) 13.0 - 17.0 g/dL   HCT 31.9 (L) 39 - 52 %   MCV 96.1 80.0 - 100.0 fL   MCH 30.1 26.0 - 34.0 pg   MCHC 31.3 30.0 - 36.0 g/dL   RDW 19.9 (H) 11.5 - 15.5 %   Platelets 270 150 - 400 K/uL   nRBC 0.0 0.0 - 0.2 %   Neutrophils Relative % 74 %   Neutro Abs 5.9 1.7 - 7.7 K/uL   Lymphocytes Relative 11 %   Lymphs Abs 0.9 0.7 - 4.0 K/uL   Monocytes Relative 11 %   Monocytes Absolute 0.9 0 - 1 K/uL   Eosinophils Relative 3 %   Eosinophils Absolute 0.3 0 - 0 K/uL   Basophils Relative 0 %   Basophils Absolute 0.0 0 - 0 K/uL   Immature Granulocytes 1 %   Abs Immature Granulocytes 0.04 0.00 - 0.07 K/uL  Comprehensive metabolic panel     Status: Abnormal   Collection Time: 06/26/20  8:33 AM  Result Value Ref Range   Sodium 139 135 - 145 mmol/L   Potassium 4.8 3.5 - 5.1 mmol/L   Chloride 103 98 - 111 mmol/L   CO2 22 22 - 32 mmol/L   Glucose, Bld 113 (H) 70 - 99 mg/dL   BUN 25 (H) 8 - 23 mg/dL   Creatinine, Ser 1.11 0.61 - 1.24 mg/dL   Calcium 9.5 8.9 - 10.3 mg/dL   Total Protein 6.5 6.5 - 8.1 g/dL   Albumin 3.1 (L) 3.5 - 5.0 g/dL   AST 14 (L) 15 - 41 U/L   ALT 6 0 - 44 U/L   Alkaline Phosphatase 71 38 - 126 U/L   Total Bilirubin 0.6 0.3 - 1.2 mg/dL   GFR calc non Af Amer >60 >60 mL/min   GFR calc Af Amer >60 >60 mL/min   Anion gap 14 5 - 15  Glucose, capillary     Status: Abnormal   Collection Time: 06/26/20 11:54 AM  Result Value Ref Range   Glucose-Capillary 121 (H) 70 - 99 mg/dL  CT ABDOMEN PELVIS WO CONTRAST  Result Date: 06/24/2020 CLINICAL DATA:  Abdominal pain. EXAM: CT ABDOMEN AND PELVIS WITHOUT CONTRAST TECHNIQUE: Multidetector CT imaging of the abdomen and pelvis was performed following the standard protocol without IV contrast. COMPARISON:  June 11, 2020 FINDINGS: Lower chest: Multiple sternal wires are present. Predominant stable mild to moderate severity areas of atelectasis and/or infiltrate are seen within the bilateral lung bases. Small  bilateral pleural effusions are also seen. These are increased in size when compared to the prior exam. Hepatobiliary: No focal liver abnormality is seen. Status post cholecystectomy. No biliary dilatation. Pancreas: Enlargement of the pancreatic head is noted which measures approximately 3.9 cm x 3.5 cm. The remaining visualized portion of the pancreatic parenchyma is unremarkable. Spleen: Normal in size without focal abnormality. Adrenals/Urinary Tract: Adrenal glands are unremarkable. Kidneys are normal in size, without renal calculi or hydronephrosis. A 2.1 cm diameter cyst is seen within the posterior aspect of the mid left kidney. Persistent mild to moderate severity bilateral perinephric inflammatory fat stranding is noted. A Foley catheter is seen within the urinary bladder. Mild diffuse urinary bladder wall thickening is also seen with a mild amount of surrounding inflammatory fat stranding. Stomach/Bowel: Stomach is within normal limits. Appendix appears normal. No evidence of bowel wall thickening, distention, or inflammatory changes. Vascular/Lymphatic: There is marked severity calcification of the abdominal aorta and bilateral common iliac arteries, without evidence of aneurysmal dilatation. No enlarged abdominal or pelvic lymph nodes. Reproductive: Mild to moderate severity prostate gland enlargement is seen. Other: No abdominal wall hernia or abnormality. No abdominopelvic ascites. Musculoskeletal: Mild to moderate severity inflammatory fat stranding is seen within the left ischial anal fossa. There is no evidence of associated fluid collection or abscess. The soft tissue air seen within this region on the prior study is no longer present. Numerous heterogeneous sclerotic foci are seen throughout the thoracolumbar spine and bony pelvis. IMPRESSION: 1. Mild to moderate severity residual inflammatory fat stranding within the left ischioanal fossa without evidence of associated fluid collection, soft  tissue air or abscess. 2. Numerous heterogeneous sclerotic foci throughout the thoracolumbar spine and bony pelvis, consistent with osseous metastasis. 3. Enlargement of the pancreatic head which measures approximately 3.9 cm x 3.5 cm. MRI correlation is recommended to further exclude the presence of an underlying pancreatic mass. 4. Small bilateral pleural effusions, increased in size when compared to the prior exam. 5. Stable, mild to moderate severity bibasilar atelectasis and/or infiltrate. 6. Enlarged prostate gland. 7. Mild diffuse urinary bladder wall thickening with a mild amount of surrounding inflammatory fat stranding. While this may represent sequelae associated with cystitis, correlation with urinalysis is recommended. 8. 2.1 cm left renal cyst. 9. Aortic atherosclerosis. Aortic Atherosclerosis (ICD10-I70.0). Electronically Signed   By: Virgina Norfolk M.D.   On: 06/24/2020 22:50   DG Abd 2 Views  Result Date: 06/25/2020 CLINICAL DATA:  Nausea and vomiting with eating EXAM: X-RAY ABDOMEN 2 VIEWS COMPARISON:  Abdomen and pelvis CT from yesterday FINDINGS: Normal bowel gas pattern. No concerning mass effect or gas collection. Atheromatous calcification of the aorta, iliacs, and branch vessels. Negative for pneumoperitoneum when decubitus. Cholecystectomy clips. IMPRESSION: Normal bowel gas pattern.  No evidence of pneumoperitoneum. Electronically Signed   By: Monte Fantasia M.D.   On: 06/25/2020 10:12    ROS:  As stated above in the HPI otherwise negative.  Blood pressure (!) 167/64, pulse 73, temperature 98.5 F (36.9 C), temperature source Oral, resp. rate 18, height 5\' 4"  (1.626 m),  weight 71.2 kg, SpO2 96 %.    PE: Gen: NAD, somnolent, but he does engage when prompted. HEENT:  [place]/AT, EOMI Lungs: CTA Bilaterally, but decreased breath sounds. CV: RRR without M/G/R ABD: Soft, NTND, +BS Ext: No C/C/E  Assessment/Plan: 1) Dysphagia. 2) Parkinson's disease. 3) History of CVA. 4)  Metastatic prostate cancer.   The patient's ex-wife was present during this evaluation.  She states that he did not have any issues with swallowing before being admitted in late August.  It appears that his dysphagia is a combination of his debilitated state, which was significantly worsened with the medical issues during the recent and current hospitalization.  Hopefully in time he will be able to regain his ability to swallow, but this may take some time.  He is able to tolerate liquids without any problems.  Solid foods cause him issues as he cannot coordinate his breathing and chewing at the same time.  Currently there is no indication to perform an EGD or any other GI work up.  Plan: 1) Continue supportive care. 2) Small frequent meals as outlined by Speech Pathology.  Mert Dietrick D 06/26/2020, 1:59 PM

## 2020-06-26 NOTE — Progress Notes (Signed)
Patient vomited light green/yellowish liquids during medication administration. Patient stated he did not feel nauseous. Concern for aspiration. Bowel sounds active. NP on call notified. New orders placed for chest x-ray.

## 2020-06-26 NOTE — TOC Progression Note (Addendum)
Transition of Care [facility]) - Progression Note    Patient Details  Name: [name] MRN: [id number] Date of Birth: 08-08-1946  [facility] CM/SW Contact  Ross Ludwig, [place] Phone Number: 06/26/2020, 10:46 AM  Clinical Narrative:     CSW called patient's wife to discuss with her SNF verse home health.  Per patient's wife, she still does not want him to go to SNF due to fear of Covid and CDiff.  CSW informed her that because of his insurance it is going to be a challenge to find home health.  CSW also informed her that patient will not be seen very often, she expressed understanding.  Per patient's wife, she has made arrangements for patient's family to rotate.  Patient's wife also requested an air mattress if possible for the hospital bed.  Patient was being seen by [facility], but unfortunately they are not able to continue to see her due to patient needing more care then they can provide.  Patient's wife is requesting that palliative follow her outpatient, patient is already open to [facility].  Per patient's wife, she only needs a [place] for a couple times a week to make sure she is doing the dressing correctly.  Patient and family will be providing 24 hour care.    Expected Discharge Plan and Services    Patient still plans to discharge back home with home health.                                             Social Determinants of Health (SDOH) Interventions    Readmission Risk Interventions No flowsheet data found.

## 2020-06-26 NOTE — Progress Notes (Signed)
Patient seen and examined today and evaluated by gastroenterology who felt his dysphagia is a combination of his Parkinson's and prior old stroke and do not recommend any imaging or diagnostic modalities I had a long discussion subsequently with his wife in addition to my prior discussion with her earlier today and they are still hoping for full CODE STATUS improvement and coordination with Dr. Lovena Neighbours and Dr. Alen Blew for management of his new diagnosis of prostate cancer I have CCed both of them in addition to his PCP as patient is high risk for readmission if he fails to thrive He will be going home on palliative care tomorrow [facility] and may need to transition to hospice if he fails to thrive  We will order desat screen to ensure that he does not require oxygen at home as he did have some desaturations overnight 9/13  Please see discharge summary dated 06/26/2020 for details  Verneita Griffes, MD [facility] 5:34 PM

## 2020-06-26 NOTE — Progress Notes (Signed)
Manufacturing engineer [facility]) Community Based Palliative Care       This patient is enrolled in our palliative care services in the community.  [facility] will continue to follow for any discharge planning needs and to coordinate continuation of palliative care.    [facility] palliative team has been made aware of possibility that [name] or family may soon elect to transition to hospice care and will need close follow up.  If you have questions or need assistance, please call [phone] or contact the hospital Liaison listed on AMION.     Thank you for the opportunity to participate in this patient's care.     Domenic Moras, BSN, RN [facility] Liaison   [phone]

## 2020-06-27 LAB — GLUCOSE, CAPILLARY
Glucose-Capillary: 82 mg/dL (ref 70–99)
Glucose-Capillary: 81 mg/dL (ref 70–99)

## 2020-06-27 MED ORDER — INSULIN GLARGINE 100 UNIT/ML SOLOSTAR PEN: 10.0000 [IU] | PEN_INJECTOR | Freq: Every day | SUBCUTANEOUS | 0 refills | Status: AC

## 2020-06-27 NOTE — TOC Progression Note (Signed)
Problems with Aspiration  Patient spends 95% of the time in bed. Problems with aspiration causes frequent gagging and choking. Torso required to be elevated at least 30 or more to prevent aspiration. Bed wedges do not provide adequate elevation to resolve issues with aspiration.  Gagging and choking requires frequent changes in body position which cannot be achieved with a normal bed.

## 2020-06-27 NOTE — Progress Notes (Signed)
PROGRESS NOTE  Subjective: Patient seen and examined with caretaker/ex-wife at bedside this morning. Was discharged yesterday with plans to confirm availability of as much home health support as we could provide. They are eager to take him home as soon as possible. He denies any shortness of breath, his cough is decreased and his breathing sounds much clearer to his ex-wife. He denies pain, is having BMs and taking soft dysphagia diet. Had choking episode with medications last night but none since.   Objective: BP (!) 167/62 (BP Location: Right Arm)   Pulse 70   Temp 98.4 F (36.9 C) (Oral)   Resp 18   Ht 5\' 4"  (1.626 m)   Wt 71.2 kg   SpO2 99%   BMI 26.95 kg/m   Gen: Frail, interactive male in no distress Pulm: Clear anterolaterally without wheezes. CV: RRR, no murmur, no JVD, no edema. Boots floating heels bilaterally.  GI: Soft, NT, ND, +BS  Neuro: Alert and oriented. Left hemiparesis. Skin: Wound not visualized on this exam. Please see prior notes.   Assessment & Plan: Pt readmitted with respiratory failure due to pneumonia, high suspicion of aspiration which continues. Reviewed SLP evaluation recommendations which the ex-wife is familiar with. Would benefit from remaining upright, so hospital bed preferred. Hypoxemia and respiratory status has improved significantly and he is medically optimized for discharge.  - I agree with the previous hospitalist that this patient would benefit greatly from converting to hospice care at home, though goals of care are currently surrounding regaining function, rehabilitation. They still do not wish for rehabilitation at SNF, and have arranged for family rotation to provide 24 hr supervision. [facility] to continue providing palliative care services at discharge.  - Ordered hospital bed, hoyer lift, 2L nocturnal oxygen, and home health RN for continued wound care.  - CXR this AM personally reviewed showing improving aeration overall, consistent with  clearing exam. Confirmed continuation of augmentin to complete 10-day course, encouraged to eat yogurt to restore gut flora.  - Rx lantus 100u/ml pen in lieu of toujeo as his pharmacy doesn't have toujeo 300 available and his dose does not require high concentration insulin.   Patrecia Pour, MD Pager on amion 06/27/2020, 12:16 PM

## 2020-06-27 NOTE — TOC Transition Note (Signed)
Transition of Care [facility]) - CM/SW Discharge Note   Patient Details  Name: [name] MRN: [id number] Date of Birth: [birth year]  Transition of Care [facility]) CM/SW Contact:  Ross Ludwig, LCSW Phone Number: 06/27/2020, 3:16 PM   Clinical Narrative:     CSW has contacted [facility], [facility], [facility], [facility], [facility], and [facility].  None of them are able to accept patient.    CSW also contacted [facility], [facility], and [facility] [place] waiting on decision from [facility] agency.  CSW explained to patient's wife the situation with and she expressed frustration with the agencies not accepting patients.  CSW explained how options have been exhausted.  CSW spent time talking with wife and informed her that there are not any other agencies that can accept him.  CSW again asked if she would be interested in hospice services or going to a SNF and patient's wife still declined SNF.  She again stated she does not want patient to go to SNF due to fear of Covid and CDIFF.    [facility] palliative will be following patient as an outpatient.  Patient's wife is requesting oxygen for PRN at night, however patient does not qualify for insurance to pay for it.  CSW also was informed she would like a air mattress for the hospital bed they have and a hoyer lift.  CSW received orders and also contacted [facility] to call patient's wife to discuss cost of oxygen, hospital bed, and mattress.  Patient's wife asked if patient is supposed to go to wound care clinic because on the last admission she was referred to them, CSW informed her to call the wound care clinic and they can confirm if he needs to go.  Patient's wife asked if [facility] would be able to transport patient to his wound clinic appointments, CSW provided the contact information for [facility] for her to contact them and find out.  Patient's wife was appreciative of information given, again she was disappointed that a home health agency is not able to see patient.  This CSW  will continue to try to find agency that can accept him.                             Final next level of care: [place] Barriers to Discharge: No [place] will accept this patient   Patient Goals and CMS Choice [name] their goals for this hospitalization and ongoing recovery are:: To return home with home health and palliative CMS Medicare.gov Compare Post Acute Care list provided to:: Patient Represenative (must comment) Choice offered to / list presented to : Spouse  Discharge Placement                  Name of family member notified: Langley Gauss [phone] Patient and family notified of of transfer: 06/27/20  Discharge Plan and Services                  DME Agency: [facility] Date DME Agency Contacted: 06/27/20 Time DME Agency Contacted: 62 Representative spoke with at DME Agency: [place]:  (Unable to find a [place] agency that is willing to accept patient.)          Social Determinants of Health (SDOH) Interventions     Readmission Risk Interventions No flowsheet data found.

## 2020-06-27 NOTE — Plan of Care (Signed)

## 2020-06-27 NOTE — Progress Notes (Signed)
SLP Cancellation Note  Patient Details Name: [name] MRN: [id number] DOB: 1946-09-10   Cancelled treatment:       Reason Eval/Treat Not Completed: Other (comment) (SLP will sign off, note Dr Verlon Au has spoken extensively to pt and spouse re: chronic aspiration/aspiration pna risk) and aspiration mitigation strategies have been reviewed.  Luis Lime, MS [facility] SLP Acute Rehab Services Office [phone]   [name] 06/27/2020, 2:09 PM

## 2020-06-27 NOTE — Plan of Care (Signed)

## 2020-06-27 NOTE — Progress Notes (Addendum)
SATURATION QUALIFICATIONS: (This note is used to comply with regulatory documentation for home oxygen)  Patient Saturations on Room Air at Rest = 98%  Patient Saturations on Room Air while Ambulating = 98%  Patient Saturations on 0 Liters of oxygen while Ambulating = 98%  Please briefly explain why patient needs home oxygen: Per wife pt desats to 88% during bedtime.

## 2020-06-28 ENCOUNTER — Telehealth: Payer: Self-pay | Admitting: Nurse Practitioner

## 2020-06-28 NOTE — Telephone Encounter (Signed)
Spoke with patient's ex-wife, [name], regarding Palliative services and all questions were answered and she and patient were in agreement with scheduling a visit.  I have scheduled an In-person Consult for 06/29/20 @ 12 Noon.

## 2020-06-29 ENCOUNTER — Other Ambulatory Visit: Payer: Medicare Other | Admitting: Nurse Practitioner

## 2020-06-29 ENCOUNTER — Other Ambulatory Visit: Payer: Self-pay

## 2020-06-29 ENCOUNTER — Encounter: Payer: Self-pay | Admitting: Nurse Practitioner

## 2020-06-29 DIAGNOSIS — C801 Malignant (primary) neoplasm, unspecified: Secondary | ICD-10-CM

## 2020-06-29 DIAGNOSIS — Z515 Encounter for palliative care: Secondary | ICD-10-CM

## 2020-06-29 NOTE — Progress Notes (Signed)
[name] Consult Note Telephone: [phone]  Fax: [phone]  PATIENT NAME: [name] [name] [id number] [phone] (home)  DOB: 07/02/1946 MRN: [id number]  PRIMARY CARE PROVIDER:    [name], MD,  [place] [address] [phone]  REFERRING PROVIDER:   [name], MD [address] [place],  [place] [address] [phone]  RESPONSIBLE PARTY:   Extended Emergency Contact Information Primary Emergency Contact: [name] Address: [address]          [place], [place] [address] [name] of [place] Phone: [phone] Mobile Phone: [phone] Relation: Relative Secondary Emergency Contact: [name] Mobile Phone: [phone] Relation: Daughter  I met face to face with patient and family in home.  ASSESSMENT AND RECOMMENDATIONS:   1. Advance Care Planning/Goals of Care: Goals include to maximize quality of life and symptom management. Today's visit consisted of building trust and discussions on Palliative care medicine as a specialized medical care for people living with serious illness, aimed at facilitating better quality of life through symptoms relief, assisting with advance care plan and establishing goals of care. Patient's ex-wife [name] and daughter-in-law [name] present during visit. Patient and family expressed appreciation for education provided on Palliative care and how that differs from Hospice service. Palliative care will continue to provide support to patient, family and the medical team.   Goal of care: Patient's goal of care is function and comfort. Patient wants to remain in his home and live as long as he can possibly live. Family expressed desire to keep caring for patient at home. Directives: After extensive discussion on ramifications and implications of  code status, patient elected to be a Full Code. Patient reiterated desire for full spectrum of care including mechanical ventilation if needed. Patient report that he was told in 1988 after his stroke that he would be a vegetable and never walk again, but he later walked. He expressed hopefulness that he would recover from his current illness. It was explained to patient that code status will be regularly reviewed to ensure that it reflects patient wishes. MOST form was discussed and completed, details include; CPR, Full scope of treatment, antibiotics if indicated, IV fluids if indicated and feeding tube for a defined trial period.  2. Symptom Management: Patient s/p hospitalization from 06/19/2020 to 06/26/2020 for acute respiratory failure. Patient diagnosed with prostate cancer with mets to bone, pelvis, and spine, new lesion noted on pancrease. Plan is for palliative hormne therapy. Patient denied pain, family report patient not complaining of pain. Patient report bothersome thick phlem in his throat. Report thick secretion making it difficult to swallow solid foods. Family report patient vomiting at times while trying to swallow food. Family report poor food and fluid intake. Symptom began before hiospital discharge. Patient had swallow eval in hospital, dysphagia ruled out. Family report patient mostly drinks ginger tea, consumes less than 573ml of fluid a day. Patient denied nausea at baseline, report vomiting when chewing or trying to swallow food. Family report nutritional supplement like Ensure worsens patients situation. Patient on Mucinex DM, family report he takes Tussinex instead. No wheezing, congestion or oral lesion noted on physical exam. Discussed with patient and family, discussed possible causes of thick secretions to include dehydration. Recommended increased fluid intake, discussed possibility of making fruit smoothies with nutrient rich fruits and protein powder or peanut butter. Advised to  make smoothie light for  easy swallow. Recommended administering Zofran 30 minutes to 1 hour before meals morning and evening. Patient verbalized agreement with plan, and agreed to try this regimen. Family expressed concern for sugar in fruits as patient is diabetic. Family educated that according to ADA, any fruit is fine to eat for any diabetic patient as long as he is not allergic to it.   3. Follow up Palliative Care Visit: Palliative care will continue to follow for goals of care clarification and symptom management. Return in about 2 weeks or prn.  4. Family /Caregiver/Community Supports: Patient lives at home. His ex-wife is his main care giver. Patient has support family who assist with his care.   5. Cognitive / Functional decline: Patient alert and oriented x 4, able to make his own decisions. He is currently dependent on family for all of his ADLs, non-ambulatory  I spent 80 minutes providing this consultation, time includes time spent with patient and family, chart review, provider coordination, and documentation  More than 50% of the time in this consultation was spent coordinating communication.   HISTORY OF PRESENT ILLNESS:  [name] is a 74 y.o. year old male with multiple medical problems including Prostate cancer, CHF (EF 60-65%), Parkinson disease,Type 2 diabetes, seizure disorder, left side weakness from prior CVA,  Chronic wound in buttocks with tuneling. Palliative Care was asked to follow this patient by consultation request of Luis Jordan, MD to help address advance care planning and goals of care. This is a follow up visit.  CODE STATUS: Full status  PPS: 40%  HOSPICE ELIGIBILITY/DIAGNOSIS: TBD  PAST MEDICAL HISTORY:  Past Medical History:  Diagnosis Date  . At high risk for falls   . Bladder cancer [facility])    urologist-  dr [name]  . CAD (coronary artery disease) cardiologist-  dr [name]  . Cerebrovascular arteriosclerosis   . Gait disturbance,  post-stroke    uses cane, rollator, and w/c long distance  . Hemiparkinsonism [facility]) neurologist-- dr [name]   right side body  . History of basal cell carcinoma excision    Sept 2016--  MOH's surgery side of nose  . History of carotid artery stenosis cardiologist-- dr [name]   bilateral --  1999 s/p  bilateral ICA endarterectomy and recurrent restenosis 2012  s/p  staged bilateral stenting   per last duplex 2015  stents widely patent  . History of CVA with residual deficit neurologist-  dr [name]   3/ 1999  Right MCA  and  12/ 2004  Anterior division of  Right MCA ---  residual left spactic hemiparesis and left foot drop  . History of MI (myocardial infarction)    02/ 2004   s/p  cabg   . HOH (hard of hearing)    LEFT EAR  POST cva  . Hyperextension deformity of left knee    wears brace  . Hyperlipidemia   . Hypertension   . Left foot drop    residual from CVA  -- wears brace  . Left spastic hemiparesis ([place])    residual CVA 1999  . Pancreatic pseudocyst   . Peripheral arterial disease ([place])   . S/P CABG x 4    02/ 2004  . Seizure disorder [facility]) last seizure 2011 due to confusion   neurologist-  dr [name]-- per note seizure documented 2008 breakthrough partial complex seizure (prior tonic-clonic seizure's post CVA)  . Type 2 diabetes, diet controlled ([place])     2  . Visual neglect  LEFT EYE    SOCIAL HX:  Social History   Tobacco Use  . Smoking status: Former Smoker    Years: 5.00    Types: Cigarettes    Quit date: 11/14/2007    Years since quitting: 12.6  . Smokeless tobacco: Never Used  Substance Use Topics  . Alcohol use: No   FAMILY HX:  Family History  Problem Relation Age of Onset  . Stroke Father   . Heart attack Father   . Stroke Mother   . Diabetes Brother   . Hyperlipidemia Brother   . Hypertension Brother   . Hyperlipidemia Sister   . Hypertension Sister     ALLERGIES:  Allergies  Allergen Reactions  . Altace [Ramipril] Cough       PERTINENT MEDICATIONS:  Outpatient Encounter Medications as of 06/29/2020  Medication Sig  . acetaminophen (TYLENOL) 325 MG tablet Take 2 tablets (650 mg total) by mouth every 6 (six) hours as needed for mild pain (or Fever >/= 101).  Marland Kitchen amLODipine (NORVASC) 10 MG tablet TAKE ONE TABLET BY MOUTH DAILY (Patient taking differently: Take 10 mg by mouth daily. )  . carbidopa-levodopa (SINEMET IR) 25-100 MG tablet Take 2 tablets by mouth 3 (three) times daily. Change: Take Two tabs 3 x daily  . collagenase (SANTYL) ointment Apply topically 2 (two) times daily.  . dantrolene (DANTRIUM) 50 MG capsule TAKE ONE CAPSULE BY MOUTH TWICE A DAY  . dextromethorphan-guaiFENesin (MUCINEX DM) 30-600 MG 12hr tablet Take 2 tablets by mouth 2 (two) times daily.  Marland Kitchen dipyridamole-aspirin (AGGRENOX) 200-25 MG 12hr capsule TAKE ONE CAPSULE BY MOUTH TWICE A DAY (Patient taking differently: Take 1 capsule by mouth 2 (two) times daily. )  . ezetimibe (ZETIA) 10 MG tablet TAKE ONE TABLET BY MOUTH DAILY  . fenofibrate (TRICOR) 48 MG tablet TAKE ONE TABLET BY MOUTH DAILY  . FOLBIC 2.5-25-2 MG TABS tablet Take 1 tablet by mouth once daily with food (Patient taking differently: Take 1 tablet by mouth daily. )  . furosemide (LASIX) 20 MG tablet Take 20 mg by mouth daily.  . insulin glargine (LANTUS) 100 UNIT/ML Solostar Pen Inject 10 Units into the skin daily.  . insulin lispro (HUMALOG KWIKPEN) 100 UNIT/ML KiwkPen Inject 4 Units into the skin 3 (three) times daily as needed (high blood sugar > 200).   Marland Kitchen KEPPRA 500 MG tablet TAKE ONE TABLET BY MOUTH TWICE A DAY  . metoprolol tartrate (LOPRESSOR) 25 MG tablet TAKE ONE TABLET BY MOUTH DAILY (Patient taking differently: Take 25 mg by mouth daily. )  . nitroGLYCERIN (NITROSTAT) 0.4 MG SL tablet Place 1 tablet (0.4 mg total) under the tongue every 5 (five) minutes as needed for chest pain.  Marland Kitchen Nystatin (GERHARDT'S BUTT CREAM) CREA Apply 1 application topically 3 (three) times daily.  Glory Rosebush DELICA LANCETS 75F MISC   . ONETOUCH VERIO test strip   . RAPAFLO 8 MG CAPS capsule Take 8 mg by mouth daily with breakfast.   . rosuvastatin (CRESTOR) 20 MG tablet TAKE ONE TABLET BY MOUTH DAILY   No facility-administered encounter medications on file as of 06/29/2020.    PHYSICAL EXAM / ROS:   Current and past weights: 156.64lbs, Ht 47f4", BMI 26.9kg/m2 General:  frail appearing, coherent cooperative, lying in bed in NAD Cardiovascular: denied chest pain reported, no edema, S1S2 heard Pulmonary: no cough, no increased SOB, room air Abdomen:  Denied constipation, incontinent of bowel GU: denies dysuria, foley cath in place-urine clear and yellow MSK:  Flaccid paralysis to left arm, no joint and ROM abnormalities Skin: wound to left buttocks, mild redness to right heel Neurological: Weakness   Jari Favre, DNP, AGPCNP-BC

## 2020-07-03 ENCOUNTER — Telehealth: Payer: Self-pay | Admitting: Cardiovascular Disease

## 2020-07-03 NOTE — Telephone Encounter (Signed)
Okay to restart home antihypertensive medications

## 2020-07-03 NOTE — Telephone Encounter (Signed)
Pt c/o medication issue:  1. Name of Medication:  olmesartan 20 mg hydralazine 25 mg  2. How are you currently taking this medication (dosage and times per day)? Not currently taking  3. Are you having a reaction (difficulty breathing--STAT)? no  4. What is your medication issue? Patient's ex-wife states the patient was told to stop the medications in the hospital. She would like to know if this okay since they are both BP medications. She also gave BP readings: 155/66 HR 62, 159/62 HR 75, 175/74 HR 80, 139/62 HR 75, 160/69 HR 69

## 2020-07-03 NOTE — Telephone Encounter (Signed)
Wife aware that MD advised patient resume medications. Added to med list. Patient has supply at home, no refills neede

## 2020-07-11 ENCOUNTER — Other Ambulatory Visit: Payer: Self-pay

## 2020-07-11 ENCOUNTER — Other Ambulatory Visit: Payer: Medicare Other | Admitting: Nurse Practitioner

## 2020-07-11 DIAGNOSIS — Z515 Encounter for palliative care: Secondary | ICD-10-CM

## 2020-07-11 DIAGNOSIS — C61 Malignant neoplasm of prostate: Secondary | ICD-10-CM

## 2020-07-11 NOTE — Progress Notes (Signed)
[facility] Consult Note Telephone: [phone]  Fax: [phone]  PATIENT NAME: [name] [place] [id number] [phone] (home)  DOB: 08/08/1946 MRN: [id number]  PRIMARY CARE PROVIDER:    [name], MD,  [place] [address] [phone]  REFERRING PROVIDER:   [name], MD [address] [place],  [place] [address] [phone]  RESPONSIBLE PARTY:   Extended Emergency Contact Information Primary Emergency Contact: Colclasure,[name] Address: [address]          [place], [place] [address] [name] of [place] Phone: [phone] Mobile Phone: [phone] Relation: Relative Secondary Emergency Contact: [place] of [place] Mobile Phone: [phone] Relation: Daughter  I met face to face with patient and family in home.  ASSESSMENT AND RECOMMENDATIONS:   1. Advance Care Planning/Goals of Care:  Goal of care: Patient's goal of care is function and comfort. Patient wants to remain in his home and live as long as he can possibly live. Family expressed desire to keep caring for patient at home Directives: Patient code status is full code. Patient desires full resuscitation effort in the event of cardiac or respiratory arrest. Patient report beating the odds each time he was given a grimme prognosis. Family and patient report he was told in 1988 that he would never walk and he walked. MOST form in house and on [facility] EMR. MOST form details include; CPR, Full scope of treatment, antibiotics if indicated, IV fluids if indicated and feeding tube for a defined trial period.  2. Symptom Management:  Patient diagnosed with prostate cancer with mets to bone, pelvis, and spine, new lesion also noted on pancrease. Patient with history of skin cancer on face s/p mohs surgery. Plan is for palliative hormone  therapy. Patient denied pain today, report not having any pain. He report improved appetite and oral intake. He endorsed enjoying fruit smoothies. Ex-wife who is his caregiver report fortifying the smoothies with peanut butter power. Patient denied cough, nausea, vomiting, or SOB. He report feeling better and stronger. Patient receives physical therapy twice a week and wound care by home health nurse twice a week. Patient has a stage 4 wound in his left buttocks, he has an appointment with wound clinic in two weeks. No report of malodorous drainage, fever or chills. Wife report improvement in wound appearance. Wife report open blister on fifth toes of left foot caused by provolon booth, area was covered with dry gauze. No evidence of infection noted on exam. Site to be monitored, wife to notify PCP if worsening redness, swelling or pain, may need to be treated for infection if symptoms occur.  Palliative care will continue to provide support to patient, family and the medical team.   3. Follow up Palliative Care Visit: Palliative care will continue to follow for goals of care clarification and symptom management. Return in about 4 weeks or prn.  4. Family /Caregiver/Community Supports: Patient lives at home with his ex-wife who is his main caregiver. Patient has supportive family who assist with his care. Has a daughter in-law who is a Equities trader.  5. Cognitive / Functional decline: Patient alert and oriented x 4, able to make his own decisions. Awake and more communicative during visit today. He is dependent on family for all of his ADLs, able to feed self. Non-ambulatory due to cancer metastasis to his spine.  I spent 48 minutes providing this  consultation, time includes time spent with patient and family, chart review, and documentation. More than 50% of the time in this consultation was spent coordinating communication.   HISTORY OF PRESENT ILLNESS:  [name] is a 74 y.o. year old male  with multiple medical problems including Prostate cancer with mets, CHF (EF 60-65%), Parkinson disease,Type 2 diabetes, seizure disorder, left side weakness from prior CVA,  Chronic wound in buttocks with tuneling. Palliative Care was asked to follow this patient by consultation request of Mila Palmer, MD to help address advance care planning and goals of care. This is a follow up visit form 06/29/2020.   CODE STATUS: Full Code  PPS: 30%  HOSPICE ELIGIBILITY/DIAGNOSIS: TBD  PAST MEDICAL HISTORY:  Past Medical History:  Diagnosis Date  . At high risk for falls   . Bladder cancer [facility])    urologist-  dr Patsi Sears  . CAD (coronary artery disease) cardiologist-  dr berry  . Cerebrovascular arteriosclerosis   . Gait disturbance, post-stroke    uses cane, rollator, and w/c long distance  . Hemiparkinsonism [facility]) neurologist-- dr Marjory Lies   right side body  . History of basal cell carcinoma excision    Sept 2016--  MOH's surgery side of nose  . History of carotid artery stenosis cardiologist-- dr berry   bilateral --  1999 s/p  bilateral ICA endarterectomy and recurrent restenosis 2012  s/p  staged bilateral stenting   per last duplex 2015  stents widely patent  . History of CVA with residual deficit neurologist-  dr Marjory Lies   3/ 1999  Right MCA  and  12/ 2004  Anterior division of  Right MCA ---  residual left spactic hemiparesis and left foot drop  . History of MI (myocardial infarction)    02/ 2004   s/p  cabg   . HOH (hard of hearing)    LEFT EAR  POST cva  . Hyperextension deformity of left knee    wears brace  . Hyperlipidemia   . Hypertension   . Left foot drop    residual from CVA  -- wears brace  . Left spastic hemiparesis ([facility])    residual CVA 1999  . Pancreatic pseudocyst   . Peripheral arterial disease ([facility])   . S/P CABG x 4    02/ 2004  . Seizure disorder [facility]) last seizure 2011 due to confusion   neurologist-  dr Marjory Lies-- per note seizure documented 2008  breakthrough partial complex seizure (prior tonic-clonic seizure's post CVA)  . Type 2 diabetes, diet controlled ([facility])     2  . Visual neglect    LEFT EYE    SOCIAL HX:  Social History   Tobacco Use  . Smoking status: Former Smoker    Years: 5.00    Types: Cigarettes    Quit date: 11/14/2007    Years since quitting: 12.6  . Smokeless tobacco: Never Used  Substance Use Topics  . Alcohol use: No   FAMILY HX:  Family History  Problem Relation Age of Onset  . Stroke Father   . Heart attack Father   . Stroke Mother   . Diabetes Brother   . Hyperlipidemia Brother   . Hypertension Brother   . Hyperlipidemia Sister   . Hypertension Sister     ALLERGIES:  Allergies  Allergen Reactions  . Altace [Ramipril] Cough     PERTINENT MEDICATIONS:  Outpatient Encounter Medications as of 07/11/2020  Medication Sig  . acetaminophen (TYLENOL) 325 MG tablet Take 2  tablets (650 mg total) by mouth every 6 (six) hours as needed for mild pain (or Fever >/= 101).  Marland Kitchen amLODipine (NORVASC) 10 MG tablet TAKE ONE TABLET BY MOUTH DAILY (Patient taking differently: Take 10 mg by mouth daily. )  . carbidopa-levodopa (SINEMET IR) 25-100 MG tablet Take 2 tablets by mouth 3 (three) times daily. Change: Take Two tabs 3 x daily  . collagenase (SANTYL) ointment Apply topically 2 (two) times daily.  . dantrolene (DANTRIUM) 50 MG capsule TAKE ONE CAPSULE BY MOUTH TWICE A DAY  . dextromethorphan-guaiFENesin (MUCINEX DM) 30-600 MG 12hr tablet Take 2 tablets by mouth 2 (two) times daily.  Marland Kitchen dipyridamole-aspirin (AGGRENOX) 200-25 MG 12hr capsule TAKE ONE CAPSULE BY MOUTH TWICE A DAY (Patient taking differently: Take 1 capsule by mouth 2 (two) times daily. )  . ezetimibe (ZETIA) 10 MG tablet TAKE ONE TABLET BY MOUTH DAILY  . fenofibrate (TRICOR) 48 MG tablet TAKE ONE TABLET BY MOUTH DAILY  . FOLBIC 2.5-25-2 MG TABS tablet Take 1 tablet by mouth once daily with food (Patient taking differently: Take 1 tablet by mouth  daily. )  . furosemide (LASIX) 20 MG tablet Take 20 mg by mouth daily.  . hydrALAZINE (APRESOLINE) 25 MG tablet Take 25 mg by mouth 3 (three) times daily.  . insulin glargine (LANTUS) 100 UNIT/ML Solostar Pen Inject 10 Units into the skin daily.  . insulin lispro (HUMALOG KWIKPEN) 100 UNIT/ML KiwkPen Inject 4 Units into the skin 3 (three) times daily as needed (high blood sugar > 200).   Marland Kitchen KEPPRA 500 MG tablet TAKE ONE TABLET BY MOUTH TWICE A DAY  . metoprolol tartrate (LOPRESSOR) 25 MG tablet TAKE ONE TABLET BY MOUTH DAILY (Patient taking differently: Take 25 mg by mouth daily. )  . nitroGLYCERIN (NITROSTAT) 0.4 MG SL tablet Place 1 tablet (0.4 mg total) under the tongue every 5 (five) minutes as needed for chest pain.  Marland Kitchen Nystatin (GERHARDT'S BUTT CREAM) CREA Apply 1 application topically 3 (three) times daily.  Marland Kitchen olmesartan (BENICAR) 20 MG tablet Take 20 mg by mouth daily.  Glory Rosebush DELICA LANCETS 83J MISC   . ONETOUCH VERIO test strip   . RAPAFLO 8 MG CAPS capsule Take 8 mg by mouth daily with breakfast.   . rosuvastatin (CRESTOR) 20 MG tablet TAKE ONE TABLET BY MOUTH DAILY   No facility-administered encounter medications on file as of 07/11/2020.    PHYSICAL EXAM / ROS:   Current and past weights: recent weight, last weight 156.64, Ht 5f4", BMI 26.kg/m2 General:  frail appearing, lying in bed in NAD Cardiovascular: denied chest pain , no edema, S1S2 normal Pulmonary: no cough, no increased SOB, room air Abdomen: appetite fair, denied constipation, incontinent of bowel GU: denies dysuria, incontinent of urine MSK:  Left arm flaccid paralysis Skin: wound to left buttocks (dressing intact), blister to fifth toe of left foot Neurological: Weakness   Jari Favre, DNP, AGPCNP-BC

## 2020-07-12 ENCOUNTER — Telehealth: Payer: Self-pay | Admitting: Cardiovascular Disease

## 2020-07-12 NOTE — Telephone Encounter (Signed)
Falcon's wife is calling stating [name] will be brought to his appointment by PTAR and the medic will not be leaving him or the gurney during the appointment and she will being attending also due to being his caregiver. Please advise.

## 2020-07-12 NOTE — Telephone Encounter (Signed)
Spoke with pt wife, aware okay to come to appointment with patient.

## 2020-07-16 NOTE — Progress Notes (Signed)
GU Location of Tumor / Histology: prostatic adenocarcinoma  If Prostate Cancer, Gleason Score is (5 + 4) and PSA is (107). Prostate volume: 74.95 grams   [name] with a personal history of bladder ca treated with surgery and BCG by Dr. Gaynelle Arabian. Patient's ex wife, [name], requested the patient's PSA be check approximately 2 months ago since she was aware it hadn't been done in 8 years and there were problems with the foley (leaking around the tube).     Biopsies of prostate (if applicable) revealed:   Past/Anticipated interventions by urology, if any: prostate biopsy, bone scan (multiple sites of abnormal osseous tracer uptake in bilateral ribs, thoracic spine, lumbar spine and pelvis. Received ADT on July 03, 2020  Past/Anticipated interventions by medical oncology, if any: none  Weight changes, if any: Down approximately 15 lb with last hospitalization   Bowel/Bladder complaints, if any: IPSS 0. Patient has an indwelling foley catheter. Reports catheter has been in place since May 1 and gets changed ones per month. Reports occasional sensation of dysuria. Denies hematuria. Denies urinary leakage around drainage tube.    Nausea/Vomiting, if any: denies  Pain issues, if any:  Denies pain despite bone mets. Reports he is numb on his left side and unable to walk due to spine mets.   SAFETY ISSUES:  Prior radiation? Denies   Pacemaker/ICD? denies  Possible current pregnancy? no, male patient  Is the patient on methotrexate? denies  Current Complaints / other details:  74 year old male. Divorced. Resides in [place] with ex-wife, [name] (also his POA and caregiver).   Patient had CVA following TURBT. Now with left sided weakness.

## 2020-07-17 ENCOUNTER — Ambulatory Visit
Admission: RE | Admit: 2020-07-17 | Discharge: 2020-07-17 | Disposition: A | Discharge status: Home / Self Care | Payer: Medicare Other | Source: Ambulatory Visit | Attending: Radiation Oncology | Admitting: Radiation Oncology

## 2020-07-17 ENCOUNTER — Encounter: Payer: Self-pay | Admitting: Cardiovascular Disease

## 2020-07-17 ENCOUNTER — Ambulatory Visit (INDEPENDENT_AMBULATORY_CARE_PROVIDER_SITE_OTHER): Payer: Medicare Other | Admitting: Cardiovascular Disease

## 2020-07-17 ENCOUNTER — Ambulatory Visit
Admit: 2020-07-17 | Discharge: 2020-07-17 | Disposition: A | Discharge status: Home / Self Care | Payer: Medicare Other | Attending: Radiation Oncology | Admitting: Radiation Oncology

## 2020-07-17 ENCOUNTER — Encounter: Payer: Self-pay | Admitting: Radiation Oncology

## 2020-07-17 ENCOUNTER — Other Ambulatory Visit: Payer: Self-pay

## 2020-07-17 DIAGNOSIS — I6523 Occlusion and stenosis of bilateral carotid arteries: Secondary | ICD-10-CM

## 2020-07-17 DIAGNOSIS — E785 Hyperlipidemia, unspecified: Secondary | ICD-10-CM | POA: Diagnosis not present

## 2020-07-17 DIAGNOSIS — I10 Essential (primary) hypertension: Secondary | ICD-10-CM | POA: Diagnosis not present

## 2020-07-17 DIAGNOSIS — C7951 Secondary malignant neoplasm of bone: Secondary | ICD-10-CM

## 2020-07-17 DIAGNOSIS — C61 Malignant neoplasm of prostate: Secondary | ICD-10-CM

## 2020-07-17 DIAGNOSIS — Z951 Presence of aortocoronary bypass graft: Secondary | ICD-10-CM

## 2020-07-17 HISTORY — DX: Malignant neoplasm of prostate: C61

## 2020-07-17 MED ORDER — NITROGLYCERIN 0.4 MG SL SUBL: 0.4000 mg | SUBLINGUAL_TABLET | SUBLINGUAL | 3 refills | Status: AC | PRN

## 2020-07-17 NOTE — Assessment & Plan Note (Signed)
History of carotid artery disease status post bilateral carotid artery stenting by myself and Dr. Trula Slade in a staged fashion back in 2012.  He has had bilateral carotid endarterectomies prior to that back in 1999 and had restenosis.  His most recent carotid Dopplers performed 11/01/2018 revealed this to be widely patent.  Given the fact that he is now on palliative care we will stop performing carotid Doppler studies.

## 2020-07-17 NOTE — Assessment & Plan Note (Signed)
History of essential hypertension a blood pressure measured today at 107/57.  He is on metoprolol, Benicar and hydralazine once a day.  His caregiver and ex-wife [name] checks his vital signs on a daily basis.

## 2020-07-17 NOTE — Patient Instructions (Signed)
  Follow-Up: At [facility], you and your health needs are our priority.  As part of our continuing mission to provide you with exceptional heart care, we have created designated Provider Care Teams.  These Care Teams include your primary Cardiologist (physician) and Advanced Practice Providers (APPs -  Physician Assistants and Nurse Practitioners) who all work together to provide you with the care you need, when you need it.  We recommend signing up for the patient portal called "MyChart".  Sign up information is provided on this After Visit Summary.  MyChart is used to connect with patients for Virtual Visits (Telemedicine).  Patients are able to view lab/test results, encounter notes, upcoming appointments, etc.  Non-urgent messages can be sent to your provider as well.   To learn more about what you can do with MyChart, go to [email or web address].    Your next appointment:   6 month(s)  The format for your next appointment:   In Person  Provider:   You will see one of the following Advanced Practice Providers on your designated Care Team:    Kerin Ransom, PA-C  [place], [place]  Coletta Memos, [place]  Then, Quay Burow, MD will plan to see you again in 12 month(s).

## 2020-07-17 NOTE — Assessment & Plan Note (Signed)
History of dyslipidemia on Zetia, fenofibrate and Crestor with lipid profile performed 05/03/2020 revealing total cholesterol of 112, LDL 48 and HDL 35.

## 2020-07-17 NOTE — Assessment & Plan Note (Signed)
History of CAD status post CABG back in 2004 by Dr. Roxan Hockey.  He had a negative Myoview 01/09/2011.  He denies chest pain.

## 2020-07-17 NOTE — Progress Notes (Signed)
07/17/2020 [name]   07-29-1946  [id number]  Primary Physician Luis Jordan, MD Primary Cardiologist: Lorretta Harp MD [name], [place]  HPI:  [name] is a 74 y.o.    mildly overweight but was Caucasian male father of 4 children grandfather of 8 grandchildren who is accompanied by his first ex-wife [name]  who is his caregiver. I last saw him in the office10/20/2020. His primary care physician is Dr. Jonathon [name]. He he is a retired Administrator. His cardiac risk factor profile was positive for 75 pack years of tobacco smoking having quit in 2009. He is treated hyperlipidemia. He did have bilateral carotid endarterectomies back in 1999 a sister with a stroke that caused left hemiparesis and paresthesias. He had bilateral carotid stenting performed by Dr. Trula Slade and myself in a staged fashion because of restenosis back in 2012. He also clearly has Parkinson's disease which has been progressive over the last year now requiring him to ambulate with the aid of a walker.. He had coronary bypass grafting in 2004 by Dr. Merilynn Finland and a Myoview stress test performed 01/09/11 was nonischemic. He denies chest pain or shortness of breath. He has been diagnosed with bladder cancer and is currently being treated by Dr. Denton Brick. He has had 4 bladder surgeries and treatment with BCG.. Addition, he was recently diagnosed with Parkinson's disease and is wheelchair-bound.  Since I saw him in the office a year ago he was hospitalized at [facility] for a week 06/19/2020 for question of aspiration pneumonia and abscess on his buttocks.  He does have metastatic prostate cancer which is now progressed to his bones getting hormonal therapy.  He no longer is wheelchair-bound but bed to stretcher.  Hospice/palliative care is involved and his advanced directives are such that he is a DNR.   Current Meds  Medication Sig  . acetaminophen (TYLENOL) 325 MG tablet  Take 2 tablets (650 mg total) by mouth every 6 (six) hours as needed for mild pain (or Fever >/= 101).  Marland Kitchen amLODipine (NORVASC) 10 MG tablet TAKE ONE TABLET BY MOUTH DAILY (Patient taking differently: Take 10 mg by mouth daily. )  . carbidopa-levodopa (SINEMET IR) 25-100 MG tablet Take 2 tablets by mouth 3 (three) times daily. Change: Take Two tabs 3 x daily  . collagenase (SANTYL) ointment Apply topically 2 (two) times daily.  . dantrolene (DANTRIUM) 50 MG capsule TAKE ONE CAPSULE BY MOUTH TWICE A DAY  . dipyridamole-aspirin (AGGRENOX) 200-25 MG 12hr capsule TAKE ONE CAPSULE BY MOUTH TWICE A DAY (Patient taking differently: Take 1 capsule by mouth 2 (two) times daily. )  . ezetimibe (ZETIA) 10 MG tablet TAKE ONE TABLET BY MOUTH DAILY  . fenofibrate (TRICOR) 48 MG tablet TAKE ONE TABLET BY MOUTH DAILY  . FOLBIC 2.5-25-2 MG TABS tablet Take 1 tablet by mouth once daily with food (Patient taking differently: Take 1 tablet by mouth daily. )  . furosemide (LASIX) 20 MG tablet Take 20 mg by mouth daily.  . hydrALAZINE (APRESOLINE) 25 MG tablet Take 25 mg by mouth 1 day or 1 dose.   . insulin glargine (LANTUS) 100 UNIT/ML Solostar Pen Inject 10 Units into the skin daily.  . insulin lispro (HUMALOG KWIKPEN) 100 UNIT/ML KiwkPen Inject 4 Units into the skin 3 (three) times daily as needed (high blood sugar > 200).   Marland Kitchen KEPPRA 500 MG tablet TAKE ONE TABLET BY MOUTH TWICE A DAY  . metoprolol tartrate (  LOPRESSOR) 25 MG tablet TAKE ONE TABLET BY MOUTH DAILY (Patient taking differently: Take 25 mg by mouth daily. )  . nitroGLYCERIN (NITROSTAT) 0.4 MG SL tablet Place 1 tablet (0.4 mg total) under the tongue every 5 (five) minutes as needed for chest pain.  Marland Kitchen Nystatin (GERHARDT'S BUTT CREAM) CREA Apply 1 application topically 3 (three) times daily.  Marland Kitchen olmesartan (BENICAR) 20 MG tablet Take 20 mg by mouth daily.  Glory Rosebush DELICA LANCETS 17O MISC   . ONETOUCH VERIO test strip   . RAPAFLO 8 MG CAPS capsule Take 8  mg by mouth daily with breakfast.   . rosuvastatin (CRESTOR) 20 MG tablet TAKE ONE TABLET BY MOUTH DAILY     Allergies  Allergen Reactions  . Altace [Ramipril] Cough    Social History   Socioeconomic History  . Marital status: Divorced    Spouse name: Not on file  . Number of children: 4  . Years of education: HS  . Highest education level: Not on file  Occupational History    Employer: RETIRED  Tobacco Use  . Smoking status: Former Smoker    Years: 5.00    Types: Cigarettes    Quit date: 11/14/2007    Years since quitting: 12.6  . Smokeless tobacco: Never Used  Vaping Use  . Vaping Use: Never used  Substance and Sexual Activity  . Alcohol use: No  . Drug use: No  . Sexual activity: Not on file  Other Topics Concern  . Not on file  Social History Narrative   Patient lives at home with his caregiver/ex-wife.   Caffeine- 2 cups of coffee daily   Social Determinants of Health   Financial Resource Strain:   . Difficulty of Paying Living Expenses: Not on file  Food Insecurity:   . Worried About Charity fundraiser in the Last Year: Not on file  . Ran Out of Food in the Last Year: Not on file  Transportation Needs:   . Lack of Transportation (Medical): Not on file  . Lack of Transportation (Non-Medical): Not on file  Physical Activity:   . Days of Exercise per Week: Not on file  . Minutes of Exercise per Session: Not on file  Stress:   . Feeling of Stress : Not on file  Social Connections:   . Frequency of Communication with Friends and Family: Not on file  . Frequency of Social Gatherings with Friends and Family: Not on file  . Attends Religious Services: Not on file  . Active Member of Clubs or Organizations: Not on file  . Attends Archivist Meetings: Not on file  . Marital Status: Not on file  Intimate Partner Violence:   . Fear of Current or Ex-Partner: Not on file  . Emotionally Abused: Not on file  . Physically Abused: Not on file  . Sexually  Abused: Not on file     Review of Systems: General: negative for chills, fever, night sweats or weight changes.  Cardiovascular: negative for chest pain, dyspnea on exertion, edema, orthopnea, palpitations, paroxysmal nocturnal dyspnea or shortness of breath Dermatological: negative for rash Respiratory: negative for cough or wheezing Urologic: negative for hematuria Abdominal: negative for nausea, vomiting, diarrhea, bright red blood per rectum, melena, or hematemesis Neurologic: negative for visual changes, syncope, or dizziness All other systems reviewed and are otherwise negative except as noted above.    Blood pressure (!) 107/57, pulse (!) 59, height 5\' 4"  (1.626 m), weight 140 lb (63.5 kg), SpO2  97 %.  General appearance: alert and no distress Neck: no adenopathy, no carotid bruit, no JVD, supple, symmetrical, trachea midline and thyroid not enlarged, symmetric, no tenderness/mass/nodules Lungs: clear to auscultation bilaterally Heart: regular rate and rhythm, S1, S2 normal, no murmur, click, rub or gallop Extremities: extremities normal, atraumatic, no cyanosis or edema Pulses: 2+ and symmetric Skin: Skin color, texture, turgor normal. No rashes or lesions Neurologic: Alert and oriented X 3, normal strength and tone. Normal symmetric reflexes. Normal coordination and gait  EKG not performed today  ASSESSMENT AND PLAN:   Carotid stenosis History of carotid artery disease status post bilateral carotid artery stenting by myself and Dr. Trula Slade in a staged fashion back in 2012.  He has had bilateral carotid endarterectomies prior to that back in 1999 and had restenosis.  His most recent carotid Dopplers performed 11/01/2018 revealed this to be widely patent.  Given the fact that he is now on palliative care we will stop performing carotid Doppler studies.  Hx of CABG History of CAD status post CABG back in 2004 by Dr. Roxan Hockey.  He had a negative Myoview 01/09/2011.  He denies  chest pain.  Dyslipidemia, goal LDL below 70 History of dyslipidemia on Zetia, fenofibrate and Crestor with lipid profile performed 05/03/2020 revealing total cholesterol of 112, LDL 48 and HDL 35.  Essential hypertension History of essential hypertension a blood pressure measured today at 107/57.  He is on metoprolol, Benicar and hydralazine once a day.  His caregiver and ex-wife [name] checks his vital signs on a daily basis.      Lorretta Harp MD FACP,FACC,FAHA, [facility] 07/17/2020 11:34 AM

## 2020-07-17 NOTE — Addendum Note (Signed)
Encounter addended by: Heywood Footman, RN on: 07/17/2020 1:54 PM  Actions taken: Visit diagnoses modified

## 2020-07-17 NOTE — Progress Notes (Signed)
Radiation Oncology         ([phone]) [phone] ________________________________  Initial Outpatient Consultation - Conducted via Telephone due to current COVID-19 concerns for limiting patient exposure  Name: [name] MRN: [id number]  Date: 07/17/2020  DOB: [birth year]  PT:WSFKCLE, Luis Booty, MD  Davis Gourd*   REFERRING PHYSICIAN: Davis Gourd*  DIAGNOSIS: 74 y.o. gentleman with castration naive metastatic prostate cancer    ICD-10-CM   1. Prostate cancer metastatic to bone [facility])  C61 Ambulatory referral to Oncology   C79.51     HISTORY OF PRESENT ILLNESS: [name] is a 74 y.o. male with a diagnosis of prostate cancer. He also has a history of high grade, invasive urothelial carcinoma of the bladder, s/p TURBT on 09/10/15 eith Dr. Gaynelle Arabian. A repeat TURBT 04/17/16 revealed low grade, noninvasive urothelial carcinoma, and TURBT on 03/26/17 showed persistent low grade, noninvasive disease. He received BCG therapy through 07/2018 and has been under surveillance with serial cystoscopies since that time and no active recurrent disease noted.  He has a longstanding history of elevated PSA dating back to at least 2010 when his PSA was 6.97, but he has previously refused biopsy.  It has fluctuated over the years, but remained relatively stable in the 4s.  It did increase to11.84 in 2016, but this was obtained around the time of TURBT.  When it was rechecked in 02/2020 with Dr. Lovena Neighbours, it had jumped to 107.  The patient proceeded to transrectal ultrasound with 12 biopsies of the prostate on 05/04/2020.  The prostate volume measured 74.95 cc.  Out of 12 core biopsies, all 12 were positive.  The maximum Gleason score was 5+4, and this was seen in all 12 cores.  Perineural invasion was identified in the right mid lateral, right mid, right base, left mid, left base, left apex lateral, and left base lateral.  Extraprostatic extension was also seen in the left lateral apex and  base.  He underwent staging scans on 06/06/2020. CT C/A/P showed a prominent prostate gland with soft tissue prominence of seminal vesicles, mild right pelvic, perirectal, and retroperitoneal adenopathy as well as numerous scattered sclerotic metastatic lesions in the thorax, lumbar spine, and bony pelvis. Bone scan, that same day, confirmed diffuse osseous metastatic disease in bilateral ribs, thoracic spine, lumbar spine, and pelvis.  He was recently readmitted to the hospital for sepsis associated with a perirectal abscess and aspiration pneumonia.  A repeat CT A/P was performed 06/24/20 and showed new enlargement of the pancreatic head, measuring approximately 3.9 cm x 3.5 cm. MRI correlation was recommended to further exclude the presence of an underlying pancreatic mass but has not been performed to date.  The patient reviewed the biopsy results with his urologist and he has kindly been referred today for discussion of potential radiation treatment options. He received his first dose of ADT on 07/03/2020.   PREVIOUS RADIATION THERAPY: No  PAST MEDICAL HISTORY:  Past Medical History:  Diagnosis Date  . At high risk for falls   . Bladder cancer [facility])    urologist-  dr Gaynelle Arabian  . CAD (coronary artery disease) cardiologist-  dr berry  . Cerebrovascular arteriosclerosis   . Gait disturbance, post-stroke    uses cane, rollator, and w/c long distance  . Hemiparkinsonism [facility]) neurologist-- dr Leta Baptist   right side body  . History of basal cell carcinoma excision    Sept 2016--  MOH's surgery side of nose  . History of carotid artery stenosis cardiologist-- dr Gwenlyn Found  bilateral --  1999 s/p  bilateral ICA endarterectomy and recurrent restenosis 2012  s/p  staged bilateral stenting   per last duplex 2015  stents widely patent  . History of CVA with residual deficit neurologist-  dr Leta Baptist   3/ 1999  Right MCA  and  12/ 2004  Anterior division of  Right MCA ---  residual left spactic  hemiparesis and left foot drop  . History of MI (myocardial infarction)    02/ 2004   s/p  cabg   . HOH (hard of hearing)    LEFT EAR  POST cva  . Hyperextension deformity of left knee    wears brace  . Hyperlipidemia   . Hypertension   . Left foot drop    residual from CVA  -- wears brace  . Left spastic hemiparesis ([place])    residual CVA 1999  . Pancreatic pseudocyst   . Peripheral arterial disease ([place])   . Prostate cancer ([place])   . S/P CABG x 4    02/ 2004  . Seizure disorder [facility]) last seizure 2011 due to confusion   neurologist-  dr Leta Baptist-- per note seizure documented 2008 breakthrough partial complex seizure (prior tonic-clonic seizure's post CVA)  . Type 2 diabetes, diet controlled ([place])     2  . Visual neglect    LEFT EYE      PAST SURGICAL HISTORY: Past Surgical History:  Procedure Laterality Date  . CARDIAC CATHETERIZATION  11/17/2002   significant left main disease and 3 vessel CAD, mildly depressed LV systolic  fx, 44% left renal artery stenosis  . CAROTID ENDARTERECTOMY  12/1997   [facility])   Bilateral ICA  . CAROTID STENT INSERTION Bilateral dr berry and dr Kristine Royal--  (In-stent Restenosis)  LeftICA   02-12-2011;  [place]   02-26-2011  . CORONARY ARTERY BYPASS GRAFT  2/ 2004   dr hendrickson   x3 LIMA to LAD,free right internal mammary artery to obtuse marginal 1,SVG to distal right coronary.  . CYSTOSCOPY W/ RETROGRADES Bilateral 09/10/2015   Procedure: CYSTOSCOPY WITH RETROGRADE PYELOGRAM;  Surgeon: Carolan Clines, MD;  Location[facility];  Service: Urology;  Laterality: Bilateral;  . CYSTOSCOPY WITH BIOPSY N/A 09/10/2015   Procedure: CYSTOSCOPY WITH BIOPSY,RIGHT TRIGONAL TUMOR 1.5 CM, EXCISIONAL BIOPSY WITH TAUBER FORCEP RIGHT POSTERIOR BLADDER WALL 1 CM, EXCISIONAL BIOPSY SATELITE BLADDER WALL 1 CM;  Surgeon: Carolan Clines, MD;  Location: [facility];  Service: Urology;  Laterality: N/A;  . CYSTOSCOPY WITH  BIOPSY N/A 03/26/2017   Procedure: CYSTOSCOPY WITH BIOPSY/;  Surgeon: Carolan Clines, MD;  Location: [facility];  Service: Urology;  Laterality: N/A;  . CYSTOSCOPY WITH HYDRODISTENSION AND BIOPSY N/A 10/29/2015   Procedure: REPEAT CYSTOSCOPY BIOPSY BLADDER DEEP MUSCLES BLADDER BIOPSY;  Surgeon: Carolan Clines, MD;  Location: [place];  Service: Urology;  Laterality: N/A;  . CYSTOSCOPY WITH URETHRAL DILATATION N/A 09/10/2015   Procedure: CYSTOSCOPY WITH URETHRAL DILATATION;  Surgeon: Carolan Clines, MD;  Location: [place];  Service: Urology;  Laterality: N/A;  . FULGURATION OF BLADDER TUMOR N/A 03/26/2017   Procedure: FULGURATION OF BLADDER TUMOR;  Surgeon: Carolan Clines, MD;  Location: [facility];  Service: Urology;  Laterality: N/A;  . [place]  . MOHS SURGERY  06-20-2015   side of nose  . NM MYOCAR PERF WALL MOTION  01/09/2011   dr berry   mild to mod. perfusion defect in the basal inferolateral & mid inferolaterl  regions consistant with an infarct/scar, no signigicant ischemia demonstracted/  Low Risk scan, no sig. change from previous study/  normal LV function and wall motion , ef 63%  . ORIF RIGHT ANKLE FX  1998   hardware retained  . TRANSURETHRAL RESECTION OF BLADDER TUMOR N/A 09/10/2015   Procedure: CYSTO TRANSURETHRAL RESECTION OF BLADDER TUMOR (TURBT) OF LEFT POSTERIOR BLADDER TUMOR 2 CM;  Surgeon: Carolan Clines, MD;  Location: [facility];  Service: Urology;  Laterality: N/A;  . TRANSURETHRAL RESECTION OF BLADDER TUMOR Left 04/17/2016   Procedure: TRANSURETHRAL RESECTION OF BLADDER TUMOR (TURBT);  Surgeon: Carolan Clines, MD;  Location: [facility];  Service: Urology;  Laterality: Left;  . US ECHOCARDIOGRAPHY  01/03/2011   normal LVF, ef>55%/  mild LAE/ trace MR and TR,  mild AV sclerosis without stenosis    FAMILY HISTORY:  Family History  Problem Relation Age of Onset  . Stroke Father   .  Heart attack Father   . Stroke Mother   . Diabetes Brother   . Hyperlipidemia Brother   . Hypertension Brother   . Hyperlipidemia Sister   . Hypertension Sister   . Breast cancer Neg Hx   . Colon cancer Neg Hx   . Pancreatic cancer Neg Hx   . Prostate cancer Neg Hx     SOCIAL HISTORY:  Social History   Socioeconomic History  . Marital status: Divorced    Spouse name: Not on file  . Number of children: 4  . Years of education: HS  . Highest education level: Not on file  Occupational History    Employer: RETIRED  Tobacco Use  . Smoking status: Former Smoker    Packs/day: 0.25    Years: 5.00    Pack years: 1.25    Types: Cigarettes    Quit date: 11/14/2007    Years since quitting: 12.6  . Smokeless tobacco: Never Used  Vaping Use  . Vaping Use: Never used  Substance and Sexual Activity  . Alcohol use: No  . Drug use: No  . Sexual activity: Not Currently  Other Topics Concern  . Not on file  Social History Narrative   Patient lives at home with his caregiver/ex-wife.   Caffeine- 2 cups of coffee daily   Social Determinants of Health   Financial Resource Strain:   . Difficulty of Paying Living Expenses: Not on file  Food Insecurity:   . Worried About Charity fundraiser in the Last Year: Not on file  . Ran Out of Food in the Last Year: Not on file  Transportation Needs:   . Lack of Transportation (Medical): Not on file  . Lack of Transportation (Non-Medical): Not on file  Physical Activity:   . Days of Exercise per Week: Not on file  . Minutes of Exercise per Session: Not on file  Stress:   . Feeling of Stress : Not on file  Social Connections:   . Frequency of Communication with Friends and Family: Not on file  . Frequency of Social Gatherings with Friends and Family: Not on file  . Attends Religious Services: Not on file  . Active Member of Clubs or Organizations: Not on file  . Attends Archivist Meetings: Not on file  . Marital Status: Not on  file  Intimate Partner Violence:   . Fear of Current or Ex-Partner: Not on file  . Emotionally Abused: Not on file  . Physically Abused: Not on file  . Sexually Abused: Not on  file    ALLERGIES: Altace [ramipril]  MEDICATIONS:  Current Outpatient Medications  Medication Sig Dispense Refill  . acetaminophen (TYLENOL) 325 MG tablet Take 2 tablets (650 mg total) by mouth every 6 (six) hours as needed for mild pain (or Fever >/= 101).    Marland Kitchen amLODipine (NORVASC) 10 MG tablet TAKE ONE TABLET BY MOUTH DAILY (Patient taking differently: Take 10 mg by mouth daily. ) 90 tablet 2  . carbidopa-levodopa (SINEMET IR) 25-100 MG tablet Take 2 tablets by mouth 3 (three) times daily. Change: Take Two tabs 3 x daily 540 tablet 3  . collagenase (SANTYL) ointment Apply topically 2 (two) times daily. 15 g 0  . dantrolene (DANTRIUM) 50 MG capsule TAKE ONE CAPSULE BY MOUTH TWICE A DAY 180 capsule 1  . dipyridamole-aspirin (AGGRENOX) 200-25 MG 12hr capsule TAKE ONE CAPSULE BY MOUTH TWICE A DAY (Patient taking differently: Take 1 capsule by mouth 2 (two) times daily. ) 180 capsule 1  . ezetimibe (ZETIA) 10 MG tablet TAKE ONE TABLET BY MOUTH DAILY 90 tablet 3  . fenofibrate (TRICOR) 48 MG tablet TAKE ONE TABLET BY MOUTH DAILY 90 tablet 0  . FOLBIC 2.5-25-2 MG TABS tablet Take 1 tablet by mouth once daily with food (Patient taking differently: Take 1 tablet by mouth daily. ) 90 tablet 0  . furosemide (LASIX) 20 MG tablet Take 20 mg by mouth daily.    . hydrALAZINE (APRESOLINE) 25 MG tablet Take 25 mg by mouth 1 day or 1 dose.     . insulin glargine (LANTUS) 100 UNIT/ML Solostar Pen Inject 10 Units into the skin daily. 3 mL 0  . insulin lispro (HUMALOG KWIKPEN) 100 UNIT/ML KiwkPen Inject 4 Units into the skin 3 (three) times daily as needed (high blood sugar > 200).     Marland Kitchen KEPPRA 500 MG tablet TAKE ONE TABLET BY MOUTH TWICE A DAY 180 tablet 3  . metoprolol tartrate (LOPRESSOR) 25 MG tablet TAKE ONE TABLET BY MOUTH DAILY  (Patient taking differently: Take 25 mg by mouth daily. ) 90 tablet 2  . olmesartan (BENICAR) 20 MG tablet Take 20 mg by mouth daily.    Glory Rosebush DELICA LANCETS 84Z MISC     . ONETOUCH VERIO test strip     . RAPAFLO 8 MG CAPS capsule Take 8 mg by mouth daily with breakfast.     . rosuvastatin (CRESTOR) 20 MG tablet TAKE ONE TABLET BY MOUTH DAILY 90 tablet 3  . nitroGLYCERIN (NITROSTAT) 0.4 MG SL tablet Place 1 tablet (0.4 mg total) under the tongue every 5 (five) minutes as needed for chest pain. (Patient not taking: Reported on 07/17/2020) 25 tablet 3  . oxyCODONE-acetaminophen (PERCOCET/ROXICET) 5-325 MG tablet Take by mouth every 4 (four) hours as needed for severe pain. (Patient not taking: Reported on 07/17/2020)     No current facility-administered medications for this encounter.    REVIEW OF SYSTEMS:  On review of systems, the patient reports that he is doing well overall. He denies any chest pain, shortness of breath, cough, fevers, chills, night sweats, unintended weight changes. He denies any bowel disturbances, and denies abdominal pain, nausea or vomiting. He denies any new musculoskeletal or joint aches or pains. He notes left-sided numbness due to CVA in 2018. His IPSS was 0 due to still having an indwelling foley catheter. A complete review of systems is obtained and is otherwise negative.    PHYSICAL EXAM:  Wt Readings from Last 3 Encounters:  07/17/20 140 lb (63.5  kg)  06/26/20 157 lb (71.2 kg)  06/16/20 172 lb 1.6 oz (78.1 kg)   Temp Readings from Last 3 Encounters:  06/27/20 98.1 F (36.7 C) (Oral)  06/18/20 98.2 F (36.8 C) (Oral)  09/21/19 (!) 97.5 F (36.4 C)   BP Readings from Last 3 Encounters:  07/17/20 (!) 107/57  06/27/20 123/62  06/18/20 (!) 165/72   Pulse Readings from Last 3 Encounters:  07/17/20 (!) 59  06/27/20 79  06/18/20 100   Pain Assessment Pain Score: 0-No pain/10  Physical exam not performed in light of telephone encounter.   KPS =  60  100 - Normal; no complaints; no evidence of disease. 90   - Able to carry on normal activity; minor signs or symptoms of disease. 80   - Normal activity with effort; some signs or symptoms of disease. 53   - Cares for self; unable to carry on normal activity or to do active work. 60   - Requires occasional assistance, but is able to care for most of his personal needs. 50   - Requires considerable assistance and frequent medical care. 64   - Disabled; requires special care and assistance. 51   - Severely disabled; hospital admission is indicated although death not imminent. 57   - Very sick; hospital admission necessary; active supportive treatment necessary. 10   - Moribund; fatal processes progressing rapidly. 0     - Dead  Karnofsky DA, Abel[place], Craver LS and Burchenal [facility] [phone]) The use of the nitrogen mustards in the palliative treatment of carcinoma: with particular reference to bronchogenic carcinoma Cancer 1 634-56  LABORATORY DATA:  Lab Results  Component Value Date   WBC 8.0 06/26/2020   HGB 10.0 (L) 06/26/2020   HCT 31.9 (L) 06/26/2020   MCV 96.1 06/26/2020   PLT 270 06/26/2020   Lab Results  Component Value Date   NA 139 06/26/2020   K 4.8 06/26/2020   CL 103 06/26/2020   CO2 22 06/26/2020   Lab Results  Component Value Date   ALT 6 06/26/2020   AST 14 (L) 06/26/2020   ALKPHOS 71 06/26/2020   BILITOT 0.6 06/26/2020     RADIOGRAPHY: CT ABDOMEN PELVIS WO CONTRAST  Result Date: 06/24/2020 CLINICAL DATA:  Abdominal pain. EXAM: CT ABDOMEN AND PELVIS WITHOUT CONTRAST TECHNIQUE: Multidetector CT imaging of the abdomen and pelvis was performed following the standard protocol without IV contrast. COMPARISON:  June 11, 2020 FINDINGS: Lower chest: Multiple sternal wires are present. Predominant stable mild to moderate severity areas of atelectasis and/or infiltrate are seen within the bilateral lung bases. Small bilateral pleural effusions are also seen. These are  increased in size when compared to the prior exam. Hepatobiliary: No focal liver abnormality is seen. Status post cholecystectomy. No biliary dilatation. Pancreas: Enlargement of the pancreatic head is noted which measures approximately 3.9 cm x 3.5 cm. The remaining visualized portion of the pancreatic parenchyma is unremarkable. Spleen: Normal in size without focal abnormality. Adrenals/Urinary Tract: Adrenal glands are unremarkable. Kidneys are normal in size, without renal calculi or hydronephrosis. A 2.1 cm diameter cyst is seen within the posterior aspect of the mid left kidney. Persistent mild to moderate severity bilateral perinephric inflammatory fat stranding is noted. A Foley catheter is seen within the urinary bladder. Mild diffuse urinary bladder wall thickening is also seen with a mild amount of surrounding inflammatory fat stranding. Stomach/Bowel: Stomach is within normal limits. Appendix appears normal. No evidence of bowel wall thickening, distention, or inflammatory  changes. Vascular/Lymphatic: There is marked severity calcification of the abdominal aorta and bilateral common iliac arteries, without evidence of aneurysmal dilatation. No enlarged abdominal or pelvic lymph nodes. Reproductive: Mild to moderate severity prostate gland enlargement is seen. Other: No abdominal wall hernia or abnormality. No abdominopelvic ascites. Musculoskeletal: Mild to moderate severity inflammatory fat stranding is seen within the left ischial anal fossa. There is no evidence of associated fluid collection or abscess. The soft tissue air seen within this region on the prior study is no longer present. Numerous heterogeneous sclerotic foci are seen throughout the thoracolumbar spine and bony pelvis. IMPRESSION: 1. Mild to moderate severity residual inflammatory fat stranding within the left ischioanal fossa without evidence of associated fluid collection, soft tissue air or abscess. 2. Numerous heterogeneous  sclerotic foci throughout the thoracolumbar spine and bony pelvis, consistent with osseous metastasis. 3. Enlargement of the pancreatic head which measures approximately 3.9 cm x 3.5 cm. MRI correlation is recommended to further exclude the presence of an underlying pancreatic mass. 4. Small bilateral pleural effusions, increased in size when compared to the prior exam. 5. Stable, mild to moderate severity bibasilar atelectasis and/or infiltrate. 6. Enlarged prostate gland. 7. Mild diffuse urinary bladder wall thickening with a mild amount of surrounding inflammatory fat stranding. While this may represent sequelae associated with cystitis, correlation with urinalysis is recommended. 8. 2.1 cm left renal cyst. 9. Aortic atherosclerosis. Aortic Atherosclerosis (ICD10-I70.0). Electronically Signed   By: Virgina Norfolk M.D.   On: 06/24/2020 22:50   DG Chest 2 View  Result Date: 06/21/2020 CLINICAL DATA:  Pneumonia.  Shortness of breath. EXAM: CHEST - 2 VIEW COMPARISON:  06/19/2020 FINDINGS: Sequelae of CABG are again identified. The cardiomediastinal silhouette is unchanged with normal heart size. Right mid lung and bilateral lower lung airspace opacities are unchanged. There are small bilateral pleural effusions. No pneumothorax is identified. IMPRESSION: Unchanged bilateral airspace disease which could reflect pneumonia or edema with small pleural effusions. Electronically Signed   By: Logan Bores M.D.   On: 06/21/2020 09:18   DG CHEST PORT 1 VIEW  Result Date: 06/26/2020 CLINICAL DATA:  Aspiration EXAM: PORTABLE CHEST 1 VIEW COMPARISON:  06/21/2020, CT 06/06/2020 FINDINGS: Post sternotomy changes. Hazy airspace disease at the right greater than left lung base, slightly improved as compared with 06/21/2020. Small bilateral effusions. Stable cardiomediastinal silhouette with aortic atherosclerosis. No pneumothorax. IMPRESSION: Small bilateral pleural effusions. Hazy airspace disease at the right greater  than left lung base, with overall improved aeration since 06/21/2020. Electronically Signed   By: Donavan Foil M.D.   On: 06/26/2020 23:13   DG Abd 2 Views  Result Date: 06/25/2020 CLINICAL DATA:  Nausea and vomiting with eating EXAM: X-RAY ABDOMEN 2 VIEWS COMPARISON:  Abdomen and pelvis CT from yesterday FINDINGS: Normal bowel gas pattern. No concerning mass effect or gas collection. Atheromatous calcification of the aorta, iliacs, and branch vessels. Negative for pneumoperitoneum when decubitus. Cholecystectomy clips. IMPRESSION: Normal bowel gas pattern.  No evidence of pneumoperitoneum. Electronically Signed   By: Monte Fantasia M.D.   On: 06/25/2020 10:12   DG ABD ACUTE 2+V W 1V CHEST  Result Date: 06/23/2020 CLINICAL DATA:  Small-bowel obstruction. EXAM: X-RAY ABDOMEN 3 VIEW COMPARISON:  CT scan 06/11/2020 FINDINGS: The upright chest x-ray demonstrates improving lung aeration with resolving infiltrates/atelectasis and effusions. Two views of the abdomen demonstrate scattered air throughout the small bowel and colon suggesting gastroenteritis or mild ileus. No significant bowel distention. No findings for small bowel obstruction or free air.  The soft tissue shadows are maintained. Advanced vascular calcifications. The bony structures are intact. IMPRESSION: 1. Improving lung aeration with resolving infiltrates/atelectasis and effusions. 2. Possible gastroenteritis or mild ileus. Electronically Signed   By: Marijo Sanes M.D.   On: 06/23/2020 12:11      IMPRESSION/PLAN: This visit was conducted via Telephone to spare the patient unnecessary potential exposure in the healthcare setting during the current COVID-19 pandemic. 1. 74 y.o. gentleman with castration naive diffusely metastatic prostate cancer. Today, we talked to the patient and family about the findings and workup thus far. We discussed the natural history of metastatic prostate cancer and general treatment, highlighting the role of  radiotherapy in the management. Given his diffuse metastatic disease, definitive radiation would be futile. However, we did discuss the possible role of palliative radiotherapy in the management of painful osseous metastasis.  Currently, he is not having any focal pain, so we recommend reserving radiation for if/when he develops site specific pain. He is currently receiving palliative ADT which was started on 07/03/20 which is the recommended course of treatment for high risk, castrate naive metastatic disease. We also recommend the patient meet with Dr. Alen Blew to discuss the potential for therapy escalation and also get his opinion regarding whether he believes local radiotherapy to the prostate would be beneficial in this case. The patient and his ex-wife, [name], were encouraged to ask questions that were answered to their satisfaction.  At the end of the conversation, the patient is in agreement to continue treatment with palliative ADT and meet with Dr. Alen Blew for further recommendations. At this point, we recommended he continue on ADT +/- additional systemic therapy for several months and see how his PSA is responding and pending PSA response, we can further discuss whether or not to proceed with local radiation therapy to the prostate. We will share our discussion with Dr. Lovena Neighbours and look forward to following his progress. We enjoyed meeting him and his ex-wife and are happy to see him back in the future if clinically indicated and would be more than happy to continue to participate in his care.  Given current concerns for patient exposure during the COVID-19 pandemic, this encounter was conducted via telephone. The patient was notified in advance and was offered a MyChart meeting to allow for face to face communication but unfortunately reported that he did not have the appropriate resources/technology to support such a visit and instead preferred to proceed with telephone consult. The patient has  given verbal consent for this type of encounter. The time spent during this encounter was 30 minutes. The attendants for this meeting include Tyler Pita MD, Ashlyn Bruning PA-C, [place], and patient, [name] and his caregiver [name] (also his ex-wife and HCPOA). During the encounter, Tyler Pita MD, Ashlyn Bruning PA-C, and scribe, Wilburn Mylar were located at [place].  Patient, EFRAIN CLAUSON and [place] were located at home.    Nicholos Johns, PA-C    Tyler Pita, MD  [facility] Direct Dial: [phone]  Fax: [phone] [email or web address]  Skype  [email or web address]  This document serves as a record of services personally performed by Tyler Pita, MD and Freeman Caldron, PA-C. It was created on their behalf by Wilburn Mylar, a trained medical scribe. The creation of this record is based on the scribe's personal observations and the provider's statements to them. This document has been checked and approved by the attending provider.

## 2020-07-19 ENCOUNTER — Telehealth: Payer: Self-pay | Admitting: Oncology

## 2020-07-19 ENCOUNTER — Telehealth: Payer: Self-pay | Admitting: *Deleted

## 2020-07-19 NOTE — Telephone Encounter (Signed)
A new pt appt has been scheduled for [name] to see Dr. Alen Blew on 10/22 at 2pm. Letter mailed.

## 2020-07-19 NOTE — Telephone Encounter (Signed)
CALLED PATIENT TO INFORM OF [place] VISIT WITH DR. SHADAD ON 08-03-20 @ 2 PM, SPOKE WITH PATIENT'S CAREGIVER- JACQUELINE AND SHE IS AWARE OF THIS APPT.

## 2020-07-20 ENCOUNTER — Other Ambulatory Visit: Payer: Self-pay | Admitting: Diagnostic Neuroimaging

## 2020-07-20 ENCOUNTER — Other Ambulatory Visit: Payer: Self-pay | Admitting: Cardiovascular Disease

## 2020-07-25 ENCOUNTER — Telehealth: Payer: Self-pay | Admitting: Oncology

## 2020-07-25 ENCOUNTER — Encounter: Payer: Self-pay | Admitting: Oncology

## 2020-07-25 NOTE — Telephone Encounter (Signed)
[name] has been rescheduled to see Dr. Alen Blew to 10/25 at 930am. I cld and lft the new appt date and time on the pt's vm. Letter mailed.

## 2020-07-26 ENCOUNTER — Telehealth: Payer: Self-pay | Admitting: Oncology

## 2020-07-26 NOTE — Telephone Encounter (Signed)
Ms. [name] has been rescheduled to see Dr Alen Blew on 10/26 at 11am. Appt date and time has been given to the pt's wife.

## 2020-07-27 ENCOUNTER — Encounter (HOSPITAL_BASED_OUTPATIENT_CLINIC_OR_DEPARTMENT_OTHER): Payer: Medicare Other | Attending: Internal Medicine | Admitting: Internal Medicine

## 2020-07-27 DIAGNOSIS — Z87891 Personal history of nicotine dependence: Secondary | ICD-10-CM | POA: Insufficient documentation

## 2020-07-27 DIAGNOSIS — S31829A Unspecified open wound of left buttock, initial encounter: Secondary | ICD-10-CM | POA: Insufficient documentation

## 2020-07-27 DIAGNOSIS — C7951 Secondary malignant neoplasm of bone: Secondary | ICD-10-CM | POA: Diagnosis not present

## 2020-07-27 DIAGNOSIS — X58XXXA Exposure to other specified factors, initial encounter: Secondary | ICD-10-CM | POA: Insufficient documentation

## 2020-07-27 DIAGNOSIS — L0231 Cutaneous abscess of buttock: Secondary | ICD-10-CM | POA: Diagnosis not present

## 2020-07-27 DIAGNOSIS — C61 Malignant neoplasm of prostate: Secondary | ICD-10-CM | POA: Diagnosis not present

## 2020-07-27 DIAGNOSIS — I69354 Hemiplegia and hemiparesis following cerebral infarction affecting left non-dominant side: Secondary | ICD-10-CM | POA: Diagnosis not present

## 2020-07-27 NOTE — Progress Notes (Signed)
[name], [name] ([id number]) . Visit Report for 07/27/2020 Allergy List Details Patient Name: Date of Service: [name], [name] [facility] [name]. 07/27/2020 2:45 PM Medical Record Number: [id number] Patient Account Number: [id number] Date of Birth/Sex: Treating RN: Feb 28, 1946 (74 y.o. [name] Primary Care Charlisha Market: [name] Other Clinician: Referring Cannie Muckle: Treating Yuette Putnam/Extender: [name] in Treatment: 0 Allergies Active Allergies Altace Reaction: cough Allergy Notes Electronic Signature(s) Signed: 07/27/2020 5:57:53 PM By: Levan Hurst RN, BSN Entered By: Levan Hurst on 07/27/2020 15:20:18 -------------------------------------------------------------------------------- Arrival Information Details Patient Name: Date of Service: Luis Pence HN [name]. 07/27/2020 2:45 PM Medical Record Number: [id number] Patient Account Number: [id number] Date of Birth/Sex: Treating RN: [birth year] (74 y.o. [name] Primary Care Nona Gracey: [name] Other Clinician: Referring Vedanshi Massaro: Treating Mcgwire Dasaro/Extender: [name] in Treatment: 0 Visit Information Patient Arrived: Stretcher Arrival Time: 15:02 Accompanied By: ex-wife Transfer Assistance: None Patient Identification Verified: Yes Secondary Verification Process Completed: Yes Patient Requires Transmission-Based Precautions: No Patient Has Alerts: Yes Patient Alerts: Patient on Blood Thinner Electronic Signature(s) Signed: 07/27/2020 5:57:53 PM By: Levan Hurst RN, BSN Entered By: Levan Hurst on 07/27/2020 15:27:58 -------------------------------------------------------------------------------- Clinic Level of Care Assessment Details Patient Name: Date of Service: Luis Patten [name]. 07/27/2020 2:45 PM Medical Record Number: [id number] Patient Account Number: [id number] Date of Birth/Sex: Treating RN: 10/17/1945 (74 y.o. Ernestene Mention Primary  Care Annebelle Bostic: [name] Other Clinician: Referring Weston Kallman: Treating Hillery Bhalla/Extender: [name] in Treatment: 0 Clinic Level of Care Assessment Items TOOL 2 Quantity Score []  - 0 Use when only an EandM is performed on the INITIAL visit ASSESSMENTS - Nursing Assessment / Reassessment X- 1 20 General Physical Exam (combine w/ comprehensive assessment (listed just below) when performed on new pt. evals) X- 1 25 Comprehensive Assessment (HX, ROS, Risk Assessments, Wounds Hx, etc.) ASSESSMENTS - Wound and Skin A ssessment / Reassessment X - Simple Wound Assessment / Reassessment - one wound 1 5 []  - 0 Complex Wound Assessment / Reassessment - multiple wounds []  - 0 Dermatologic / Skin Assessment (not related to wound area) ASSESSMENTS - Ostomy and/or Continence Assessment and Care []  - 0 Incontinence Assessment and Management []  - 0 Ostomy Care Assessment and Management (repouching, etc.) PROCESS - Coordination of Care X - Simple Patient / Family Education for ongoing care 1 15 []  - 0 Complex (extensive) Patient / Family Education for ongoing care X- 1 10 Staff obtains Programmer, systems, Records, [name] Results / Process Orders est X- 1 10 Staff telephones HHA, Nursing Homes / Clarify orders / etc []  - 0 Routine Transfer to another Facility (non-emergent condition) []  - 0 Routine Hospital Admission (non-emergent condition) X- 1 15 New Admissions / Biomedical engineer / Ordering NPWT Apligraf, etc. , []  - 0 Emergency Hospital Admission (emergent condition) X- 1 10 Simple Discharge Coordination []  - 0 Complex (extensive) Discharge Coordination PROCESS - Special Needs []  - 0 Pediatric / Minor Patient Management []  - 0 Isolation Patient Management []  - 0 Hearing / Language / Visual special needs []  - 0 Assessment of Community assistance (transportation, D/C planning, etc.) []  - 0 Additional assistance / Altered mentation []  - 0 Support  Surface(s) Assessment (bed, cushion, seat, etc.) INTERVENTIONS - Wound Cleansing / Measurement X- 1 5 Wound Imaging (photographs - any number of wounds) []  - 0 Wound Tracing (instead of photographs) X- 1 5 Simple Wound Measurement - one wound []  - 0 Complex Wound Measurement - multiple wounds X- 1 5 Simple Wound  Cleansing - one wound []  - 0 Complex Wound Cleansing - multiple wounds INTERVENTIONS - Wound Dressings X - Small Wound Dressing one or multiple wounds 1 10 []  - 0 Medium Wound Dressing one or multiple wounds []  - 0 Large Wound Dressing one or multiple wounds []  - 0 Application of Medications - injection INTERVENTIONS - Miscellaneous []  - 0 External ear exam []  - 0 Specimen Collection (cultures, biopsies, blood, body fluids, etc.) []  - 0 Specimen(s) / Culture(s) sent or taken to Lab for analysis []  - 0 Patient Transfer (multiple staff / Harrel Lemon Lift / Similar devices) []  - 0 Simple Staple / Suture removal (25 or less) []  - 0 Complex Staple / Suture removal (26 or more) []  - 0 Hypo / Hyperglycemic Management (close monitor of Blood Glucose) []  - 0 Ankle / Brachial Index (ABI) - do not check if billed separately Has the patient been seen at the hospital within the last three years: Yes Total Score: 135 Level Of Care: New/Established - Level 4 Electronic Signature(s) Signed: 07/27/2020 6:20:53 PM By: Baruch Gouty RN, BSN Entered By: Baruch Gouty on 07/27/2020 16:19:24 -------------------------------------------------------------------------------- Encounter Discharge Information Details Patient Name: Date of Service: Luis Pence HN [name]. 07/27/2020 2:45 PM Medical Record Number: [id number] Patient Account Number: [id number] Date of Birth/Sex: Treating RN: 05-05-1946 (74 y.o. [name] Primary Care Duong Haydel: [name] Other Clinician: Referring Trinidy Masterson: Treating Ren Aspinall/Extender: [name] in Treatment: 0 Encounter  Discharge Information Items Discharge Condition: Stable Ambulatory Status: Stretcher Discharge Destination: Home Transportation: Ambulance Accompanied By: EMS crew and wife Schedule Follow-up Appointment: Yes Clinical Summary of Care: Electronic Signature(s) Signed: 07/27/2020 5:54:53 PM By: Deon Pilling Entered By: Deon Pilling on 07/27/2020 16:48:14 -------------------------------------------------------------------------------- Multi Wound Chart Details Patient Name: Date of Service: Luis Patten [name]. 07/27/2020 2:45 PM Medical Record Number: [id number] Patient Account Number: [id number] Date of Birth/Sex: Treating RN: 1946/03/21 (74 y.o. Ernestene Mention Primary Care Sadia Belfiore: [name] Other Clinician: Referring Courtenay Hirth: Treating Sieara Bremer/Extender: [name] in Treatment: 0 Vital Signs Height(in): 64 Capillary Blood Glucose(mg/dl): 107 Weight(lbs): 150 Pulse(bpm): 42 Body Mass Index(BMI): 26 Blood Pressure(mmHg): 146/69 Temperature(F): 97.5 Respiratory Rate(breaths/min): 18 Photos: [1:No Photos Left Gluteus] [N/A:N/A N/A] Wound Location: [1:Gradually Appeared] [N/A:N/A] Wounding Event: [1:Abscess] [N/A:N/A] Primary Etiology: [1:Congestive Heart Failure, Coronary N/A] Comorbid History: [1:Artery Disease, Hypertension, Type II Diabetes, Seizure Disorder 05/30/2019] [N/A:N/A] Date Acquired: [1:0] [N/A:N/A] Weeks of Treatment: [1:Open] [N/A:N/A] Wound Status: [1:3.2x2.4x2.2] [N/A:N/A] Measurements L x W x D (cm) [1:6.032] [N/A:N/A] A (cm) : rea [1:13.27] [N/A:N/A] Volume (cm) : [1:10] Position 1 (o'clock): [1:1] Maximum Distance 1 (cm): [1:Yes] [N/A:N/A] Tunneling: [1:Full Thickness Without Exposed] [N/A:N/A] Classification: [1:Support Structures Medium] [N/A:N/A] Exudate Amount: [1:Serosanguineous] [N/A:N/A] Exudate Type: [1:red, brown] [N/A:N/A] Exudate Color: [1:Fibrotic scar, thickened scar] [N/A:N/A] Wound Margin:  [1:Medium (34-66%)] [N/A:N/A] Granulation Amount: [1:Red] [N/A:N/A] Granulation Quality: [1:Medium (34-66%)] [N/A:N/A] Necrotic Amount: [1:Fat Layer (Subcutaneous Tissue): Yes N/A] Exposed Structures: [1:Fascia: No Tendon: No Muscle: No Joint: No Bone: No None] [N/A:N/A] Treatment Notes Wound #1 (Left Gluteus) 1. Cleanse With Wound Cleanser 2. Periwound Care Skin Prep 3. Primary Dressing Applied Santyl Other primary dressing (specifiy in notes) 4. Secondary Dressing Foam Border Dressing 5. Secured With Self Adhesive Bandage Notes saline moisten gauze backing the santyl. explained the orders, dressings, frequency of change, and when to return to wound center. Electronic Signature(s) Signed: 07/27/2020 6:16:54 PM By: Linton Ham MD Signed: 07/27/2020 6:20:53 PM By: Baruch Gouty RN, BSN Entered By: Linton Ham on 07/27/2020 17:34:44 --------------------------------------------------------------------------------  Multi-Disciplinary Care Plan Details Patient Name: Date of Service: [name], [name] [facility] [name]. 07/27/2020 2:45 PM Medical Record Number: [id number] Patient Account Number: [id number] Date of Birth/Sex: Treating RN: 1945-10-18 (74 y.o. Ernestene Mention Primary Care Oyinkansola Truax: [name] Other Clinician: Referring Dorea Duff: Treating Manette Doto/Extender: [name] in Treatment: 0 Active Inactive Nutrition Nursing Diagnoses: Potential for alteratiion in Nutrition/Potential for imbalanced nutrition Goals: Patient/caregiver agrees to and verbalizes understanding of need to use nutritional supplements and/or vitamins as prescribed Date Initiated: 07/27/2020 Target Resolution Date: 08/24/2020 Goal Status: Active Interventions: Assess patient nutrition upon admission and as needed per policy Treatment Activities: Patient referred to Primary Care Physician for further nutritional evaluation : 07/27/2020 Notes: Wound/Skin  Impairment Nursing Diagnoses: Impaired tissue integrity Knowledge deficit related to ulceration/compromised skin integrity Goals: Patient/caregiver will verbalize understanding of skin care regimen Date Initiated: 07/27/2020 Target Resolution Date: 08/24/2020 Goal Status: Active Ulcer/skin breakdown will have a volume reduction of 30% by week 4 Date Initiated: 07/27/2020 Target Resolution Date: 08/24/2020 Goal Status: Active Interventions: Assess patient/caregiver ability to obtain necessary supplies Assess patient/caregiver ability to perform ulcer/skin care regimen upon admission and as needed Assess ulceration(s) every visit Provide education on ulcer and skin care Treatment Activities: Skin care regimen initiated : 07/27/2020 Topical wound management initiated : 07/27/2020 Notes: Electronic Signature(s) Signed: 07/27/2020 6:20:53 PM By: Baruch Gouty RN, BSN Entered By: Baruch Gouty on 07/27/2020 16:17:35 -------------------------------------------------------------------------------- Pain Assessment Details Patient Name: Date of Service: Luis Pence HN [name]. 07/27/2020 2:45 PM Medical Record Number: [id number] Patient Account Number: [id number] Date of Birth/Sex: Treating RN: 1946-08-09 (74 y.o. [name] Primary Care Domanique Huesman: [name] Other Clinician: Referring Geraldine Tesar: Treating Wendle Kina/Extender: [name] in Treatment: 0 Active Problems Location of Pain Severity and Description of Pain Patient Has Paino No Site Locations Pain Management and Medication Current Pain Management: Electronic Signature(s) Signed: 07/27/2020 5:57:53 PM By: Levan Hurst RN, BSN Entered By: Levan Hurst on 07/27/2020 15:27:27 -------------------------------------------------------------------------------- Patient/Caregiver Education Details Patient Name: Date of Service: [name] 10/15/2021andnbsp2:45 PM Medical Record  Number: [id number] Patient Account Number: [id number] Date of Birth/Gender: Treating RN: September 09, 1946 (74 y.o. Ernestene Mention Primary Care Physician: [name] Other Clinician: Referring Physician: Treating Physician/Extender: [name] in Treatment: 0 Education Assessment Education Provided To: Patient Education Topics Provided Infection: Methods: Explain/Verbal Responses: Reinforcements needed, State content correctly [place]: o Handouts: Welcome [name] [facility] [place] o Methods: Explain/Verbal, Printed Responses: Reinforcements needed, State content correctly Wound/Skin Impairment: Handouts: Caring for Your Ulcer, Skin Care Do's and Dont's Methods: Explain/Verbal, Printed Responses: Reinforcements needed, State content correctly Electronic Signature(s) Signed: 07/27/2020 6:20:53 PM By: Baruch Gouty RN, BSN Entered By: Baruch Gouty on 07/27/2020 16:18:16 -------------------------------------------------------------------------------- Wound Assessment Details Patient Name: Date of Service: Luis Pence HN [name]. 07/27/2020 2:45 PM Medical Record Number: [id number] Patient Account Number: [id number] Date of Birth/Sex: Treating RN: [birth year] (74 y.o. [name] Primary Care Hannah Crill: [name] Other Clinician: Referring Tnia Anglada: Treating Carmaleta Youngers/Extender: [name] in Treatment: 0 Wound Status Wound Number: 1 Primary Abscess Etiology: Wound Location: Left Gluteus Wound Open Wounding Event: Gradually Appeared Status: Date Acquired: 05/30/2019 Comorbid Congestive Heart Failure, Coronary Artery Disease, Weeks Of Treatment: 0 History: Hypertension, Type II Diabetes, Seizure Disorder Clustered Wound: No Wound Measurements Length: (cm) 3.2 Width: (cm) 2.4 Depth: (cm) 2.2 Area: (cm) 6.032 Volume: (cm) 13.27 % Reduction in Area: % Reduction in  Volume: Epithelialization: None Tunneling: Yes Position (o'clock):  10 Maximum Distance: (cm) 1 Undermining: No Wound Description Classification: Full Thickness Without Exposed Support Structures Wound Margin: Fibrotic scar, thickened scar Exudate Amount: Medium Exudate Type: Serosanguineous Exudate Color: red, brown Foul Odor After Cleansing: No Slough/Fibrino Yes Wound Bed Granulation Amount: Medium (34-66%) Exposed Structure Granulation Quality: Red Fascia Exposed: No Necrotic Amount: Medium (34-66%) Fat Layer (Subcutaneous Tissue) Exposed: Yes Necrotic Quality: Adherent Slough Tendon Exposed: No Muscle Exposed: No Joint Exposed: No Bone Exposed: No Treatment Notes Wound #1 (Left Gluteus) 1. Cleanse With Wound Cleanser 2. Periwound Care Skin Prep 3. Primary Dressing Applied Santyl Other primary dressing (specifiy in notes) 4. Secondary Dressing Foam Border Dressing 5. Secured With Self Adhesive Bandage Notes saline moisten gauze backing the santyl. explained the orders, dressings, frequency of change, and when to return to wound center. Electronic Signature(s) Signed: 07/27/2020 5:57:53 PM By: Levan Hurst RN, BSN Entered By: Levan Hurst on 07/27/2020 15:27:20 -------------------------------------------------------------------------------- Vitals Details Patient Name: Date of Service: Luis Pence HN [name]. 07/27/2020 2:45 PM Medical Record Number: [id number] Patient Account Number: [id number] Date of Birth/Sex: Treating RN: 1946-02-01 (74 y.o. [name] Primary Care Raye Slyter: [name] Other Clinician: Referring Mayleigh Tetrault: Treating Judye Lorino/Extender: [name] in Treatment: 0 Vital Signs Time Taken: 15:03 Temperature (F): 97.5 Height (in): 64 Pulse (bpm): 58 Source: Stated Respiratory Rate (breaths/min): 18 Weight (lbs): 150 Blood Pressure (mmHg): 146/69 Source: Stated Capillary Blood Glucose (mg/dl):  107 Body Mass Index (BMI): 25.7 Reference Range: 80 - 120 mg / dl Notes glucose per pt report Electronic Signature(s) Signed: 07/27/2020 5:57:53 PM By: Levan Hurst RN, BSN Entered By: Levan Hurst on 07/27/2020 15:04:48

## 2020-07-27 NOTE — Progress Notes (Signed)
Luis, [name] ([id number]) . Visit Report for 07/27/2020 Abuse/Suicide Risk Screen Details Patient Name: Date of Service: Luis, [name] [facility] [name]. 07/27/2020 2:45 PM Medical Record Number: [id number] Patient Account Number: [id number] Date of Birth/Sex: Treating RN: 06-Nov-1945 (74 y.o. [name] Primary Care [name]: [name] Other Clinician: Referring [name]: Treating [name]/Extender: [name] in Treatment: 0 Abuse/Suicide Risk Screen Items Answer ABUSE RISK SCREEN: Has anyone close to you tried to hurt or harm you recentlyo No Do you feel uncomfortable with anyone in your familyo No Has anyone forced you do things that you didnt want to doo No Electronic Signature(s) Signed: 07/27/2020 5:57:53 PM By: Levan Hurst RN, BSN Entered By: Levan Hurst on 07/27/2020 15:24:21 -------------------------------------------------------------------------------- Activities of Daily Living Details Patient Name: Date of Service: Luis, [name] [name]. 07/27/2020 2:45 PM Medical Record Number: [id number] Patient Account Number: [id number] Date of Birth/Sex: Treating RN: [birth year] (74 y.o. [name] Primary Care [name]: [name] Other Clinician: Referring [name]: Treating [name]/Extender: [name] in Treatment: 0 Activities of Daily Living Items Answer Activities of Daily Living (Please select one for each item) Drive Automobile Not Able [name] Medications ake Need Assistance Use [name] elephone Need Assistance Care for Appearance Need Assistance Use [name] oilet Need Assistance Bath / Shower Need Assistance Dress Self Need Assistance Feed Self Need Assistance Walk Need Assistance Get In / Out Bed Need Assistance Housework Need Assistance Prepare Meals Need Assistance Handle Money Need Assistance Shop for Self Need Assistance Electronic Signature(s) Signed: 07/27/2020 5:57:53 PM By: Levan Hurst RN,  BSN Entered By: Levan Hurst on 07/27/2020 15:25:03 -------------------------------------------------------------------------------- Education Screening Details Patient Name: Date of Service: [name]. 07/27/2020 2:45 PM Medical Record Number: [id number] Patient Account Number: [id number] Date of Birth/Sex: Treating RN: 1946-02-14 (74 y.o. [name] Primary Care [name]: [name] Other Clinician: Referring Jeena Arnett: Treating Francesca Strome/Extender: [name] in Treatment: 0 Primary Learner Assessed: Patient Learning Preferences/Education Level/Primary Language Learning Preference: Explanation, Demonstration, Printed Material Highest Education Level: High School Preferred Language: English Cognitive Barrier Language Barrier: No Translator Needed: No Memory Deficit: No Emotional Barrier: No Cultural/Religious Beliefs Affecting Medical Care: No Physical Barrier Impaired Vision: No Impaired Hearing: No Decreased Hand dexterity: No Knowledge/Comprehension Knowledge Level: High Comprehension Level: High Ability to understand written instructions: High Ability to understand verbal instructions: High Motivation Anxiety Level: Calm Cooperation: Cooperative Education Importance: Acknowledges Need Interest in Health Problems: Asks Questions Perception: Coherent Willingness to Engage in Self-Management High Activities: Readiness to Engage in Self-Management High Activities: Electronic Signature(s) Signed: 07/27/2020 5:57:53 PM By: Levan Hurst RN, BSN Entered By: Levan Hurst on 07/27/2020 15:25:23 -------------------------------------------------------------------------------- Fall Risk Assessment Details Patient Name: Date of Service: [name]. 07/27/2020 2:45 PM Medical Record Number: [id number] Patient Account Number: [id number] Date of Birth/Sex: Treating RN: 04/01/1946 (74 y.o. [name] Primary  Care [name]: [name] Other Clinician: Referring Zephaniah Enyeart: Treating Lainy Wrobleski/Extender: [name] in Treatment: 0 Fall Risk Assessment Items Have you had 2 or more falls in the last 12 monthso 0 Yes Have you had any fall that resulted in injury in the last 12 monthso 0 No FALLS RISK SCREEN History of falling - immediate or within 3 months 0 No Secondary diagnosis (Do you have 2 or more medical diagnoseso) 15 Yes Ambulatory aid None/bed rest/wheelchair/nurse 0 Yes Crutches/cane/walker 0 No Furniture 0 No Intravenous therapy Access/Saline/Heparin Lock 0 No Gait/Transferring Normal/ bed rest/ wheelchair 0 Yes Weak (short steps  with or without shuffle, stooped but able to lift head while walking, may seek 0 No support from furniture) Impaired (short steps with shuffle, may have difficulty arising from chair, head down, impaired 0 No balance) Mental Status Oriented to own ability 0 Yes Electronic Signature(s) Signed: 07/27/2020 5:57:53 PM By: Levan Hurst RN, BSN Entered By: Levan Hurst on 07/27/2020 15:25:57 -------------------------------------------------------------------------------- Nutrition Risk Screening Details Patient Name: Date of Service: [name]. 07/27/2020 2:45 PM Medical Record Number: [id number] Patient Account Number: [id number] Date of Birth/Sex: Treating RN: 1946-03-22 (74 y.o. [name] Primary Care [name]: [name] Other Clinician: Referring [name] Height (in): 64 Weight (lbs): 150 Body Mass Index (BMI): 25.7 Nutrition Risk Screening Items Score Screening NUTRITION RISK SCREEN: I have an illness or condition that made me change the kind and/or amount of food I eat 2 Yes I eat fewer than two meals per day 0 No I eat few fruits and vegetables, or milk products 0 No I have three or more drinks of beer,  liquor or wine almost every day 0 No I have tooth or mouth problems that make it hard for me to eat 0 No I don'[name] always have enough money to buy the food I need 0 No I eat alone most of the time 0 No I take three or more different prescribed or over-the-counter drugs a day 1 Yes Without wanting to, I have lost or gained 10 pounds in the last six months 0 No I am not always physically able to shop, cook and/or feed myself 2 Yes Nutrition Protocols Good Risk Protocol Moderate Risk Protocol 0 Provide education on nutrition High Risk Proctocol Risk Level: Moderate Risk Score: 5 Electronic Signature(s) Signed: 07/27/2020 5:57:53 PM By: Levan Hurst RN, BSN Entered By: Levan Hurst on 07/27/2020 15:26:06

## 2020-07-27 NOTE — Progress Notes (Signed)
NEFI, MUSICH T ([id number]) . Visit Report for 07/27/2020 Chief Complaint Document Details Patient Name: Date of Service: TANAY, MISURACA [facility] T. 07/27/2020 2:45 PM Medical Record Number: [id number] Patient Account Number: [id number] Date of Birth/Sex: Treating RN: [birth year] (74 y.o. Ernestene Mention Primary Care Provider: [name] Other Clinician: Referring Provider: Treating Provider/Extender: [name] in Treatment: 0 Information Obtained from: Patient Chief Complaint 07/27/2020; patient is here for review of an area on the left buttock medially in close proximity to the gluteal cleft Electronic Signature(s) Signed: 07/27/2020 6:16:54 PM By: Linton Ham MD Entered By: Linton Ham on 07/27/2020 17:35:21 -------------------------------------------------------------------------------- HPI Details Patient Name: Date of Service: [name] 07/27/2020 2:45 PM Medical Record Number: [id number] Patient Account Number: [id number] Date of Birth/Sex: Treating RN: 09/21/1946 (74 y.o. Ernestene Mention Primary Care Provider: [name] Other Clinician: Referring Provider: Treating Provider/Extender: [name] in Treatment: 0 History of Present Illness HPI Description: ADMISSION 07/27/2020 This is a 74 year old man who is accompanied by his wife (ex). Nevertheless she is his active caregiver. His most complicated features are remote CVA that left him with a left hemiparesis. He also has widely metastatic prostate cancer to bone which I think is contributed to increasing frailty this year and he is no longer ambulatory. He required hospitalization from 9/7 through 9/14 with respiratory failure seizures. During this time it was noted that he had an open area in the left buttock in close proximity to the gluteal cleft. His wife tells me that he has a history of perirectal abscesses. And she describes this area as  starting as a painful swelling in late August certainly sounds like an abscess. When he is in the hospital he required bedside IandD's but did not go to the OR. Has CT scan of the area showed soft tissue thickening in the perianal ulcer. At that point it was concerning for a fistula connection however a discrete abscess was not seen. Notable that he was hypoalbuminemic in the hospital but actually came out better with an albumin of 3.1 on 9/14 his wife says he is eating well. They are currently treating this with Santyl ointment and a backing wet-to-dry dressing. By description of his wife this is done quite nicely and there is certainly less debris over the wound surface. They already have a hospital bed with a level 3 pressure relief surface. The patient lies on his back when he is in bed especially at night although his wife is counseled him not to do this. They are starting sliding board transfers into his wheelchair I think with physical therapy. He has a Foley catheter in place Past medical history includes remote CVA, metastatic prostate cancer to large areas of his thoracic and lumbar spine and pelvis., Bilateral carotid artery stenosis, Parkinson's disease, type 2 diabetes, diastolic heart failure, CHF, hypertension and a recently discovered possible pancreatic mass in the head of the pancreas Electronic Signature(s) Signed: 07/27/2020 6:16:54 PM By: Linton Ham MD Entered By: Linton Ham on 07/27/2020 17:41:40 -------------------------------------------------------------------------------- Physical Exam Details Patient Name: Date of Service: [name] 07/27/2020 2:45 PM Medical Record Number: [id number] Patient Account Number: [id number] Date of Birth/Sex: Treating RN: [birth year] (74 y.o. Ernestene Mention Primary Care Provider: [name] Other Clinician: Referring Provider: Treating Provider/Extender: [name] in Treatment:  0 Constitutional Patient is hypertensive.. Pulse regular and within target range for patient.Marland Kitchen Respirations regular, non-labored and within target range.. Temperature is normal and within  the target range for the patient.Marland Kitchen Appears in no distress. Genitourinary (GU) Indwelling Foley catheter.. Musculoskeletal Has some periwound dermatitis they are using zinc oxide on this no evidence of soft tissue infection around the wound. Notes Wound exam; the area in question is on the left buttock in close proximity to the gluteal cleft and perianal area. The wound has significant depth and goes towards the ischial tuberosity but I do not appreciate any palpable bone there is no surrounding obvious infection nothing that would lend itself to a culture. If there was a fistulous connection to this wound at 1 point its not appreciable now and I am not really planning to track this down. The patient has nice healthy looking granulation at the wound orifice this becomes less healthy looking as you going to the depths of the wound with adherent yellow slough. No debridement was felt to be necessary. Electronic Signature(s) Signed: 07/27/2020 6:16:54 PM By: Linton Ham MD Entered By: Linton Ham on 07/27/2020 17:43:47 -------------------------------------------------------------------------------- Physician Orders Details Patient Name: Date of Service: [name] 07/27/2020 2:45 PM Medical Record Number: [id number] Patient Account Number: [id number] Date of Birth/Sex: Treating RN: 01-21-1946 (74 y.o. Ernestene Mention Primary Care Provider: [name] Other Clinician: Referring Provider: Treating Provider/Extender: [name] in Treatment: 0 Verbal / Phone Orders: No Diagnosis Coding Follow-up Appointments Return appointment in 3 weeks. Dressing Change Frequency Wound #1 Left Gluteus Change dressing every day. Skin Barriers/Peri-Wound Care Barrier  cream - as needed Wound Cleansing Wound #1 Left Gluteus Clean wound with Wound Cleanser Primary Wound Dressing Wound #1 Left Gluteus Santyl Ointment Secondary Dressing Wound #1 Left Gluteus Other: - pack lightly with saline moistened gauze Off-Loading Turn and reposition every 2 hours Other: - up in wheelchair no more that 2 hour increments [place] skilled nursing for wound care. - [facility] Patient Medications llergies: Altace A Notifications Medication Indication Start End 07/27/2020 Santyl DOSE topical 250 unit/gram ointment - ointment topical to wound left buttock change daily and prn Electronic Signature(s) Signed: 07/27/2020 4:29:48 PM By: Linton Ham MD Entered By: Linton Ham on 07/27/2020 16:29:48 -------------------------------------------------------------------------------- Problem List Details Patient Name: Date of Service: [name] 07/27/2020 2:45 PM Medical Record Number: [id number] Patient Account Number: [id number] Date of Birth/Sex: Treating RN: 01-Jun-1946 (74 y.o. Ernestene Mention Primary Care Provider: [name] Other Clinician: Referring Provider: Treating Provider/Extender: [name] in Treatment: 0 Active Problems ICD-10 Encounter Code Description Active Date MDM Diagnosis L02.31 Cutaneous abscess of buttock 07/27/2020 No Yes S31.829D Unspecified open wound of left buttock, subsequent encounter 07/27/2020 No Yes Inactive Problems Resolved Problems Electronic Signature(s) Signed: 07/27/2020 6:16:54 PM By: Linton Ham MD Entered By: Linton Ham on 07/27/2020 17:34:39 -------------------------------------------------------------------------------- Progress Note Details Patient Name: Date of Service: [name] 07/27/2020 2:45 PM Medical Record Number: [id number] Patient Account Number: [id number] Date of Birth/Sex: Treating RN: Jul 08, 1946 (74 y.o. Ernestene Mention Primary Care Provider: [name] Other Clinician: Referring Provider: Treating Provider/Extender: [name] in Treatment: 0 Subjective Chief Complaint Information obtained from Patient 07/27/2020; patient is here for review of an area on the left buttock medially in close proximity to the gluteal cleft History of Present Illness (HPI) ADMISSION 07/27/2020 This is a 74 year old man who is accompanied by his wife (ex). Nevertheless she is his active caregiver. His most complicated features are remote CVA that left him with a left hemiparesis. He also has widely metastatic prostate  cancer to bone which I think is contributed to increasing frailty this year and he is no longer ambulatory. He required hospitalization from 9/7 through 9/14 with respiratory failure seizures. During this time it was noted that he had an open area in the left buttock in close proximity to the gluteal cleft. His wife tells me that he has a history of perirectal abscesses. And she describes this area as starting as a painful swelling in late August certainly sounds like an abscess. When he is in the hospital he required bedside IandD's but did not go to the OR. Has CT scan of the area showed soft tissue thickening in the perianal ulcer. At that point it was concerning for a fistula connection however a discrete abscess was not seen. Notable that he was hypoalbuminemic in the hospital but actually came out better with an albumin of 3.1 on 9/14 his wife says he is eating well. They are currently treating this with Santyl ointment and a backing wet-to-dry dressing. By description of his wife this is done quite nicely and there is certainly less debris over the wound surface. They already have a hospital bed with a level 3 pressure relief surface. The patient lies on his back when he is in bed especially at night although his wife is counseled him not to do this. They are  starting sliding board transfers into his wheelchair I think with physical therapy. He has a Foley catheter in place Past medical history includes remote CVA, metastatic prostate cancer to large areas of his thoracic and lumbar spine and pelvis., Bilateral carotid artery stenosis, Parkinson's disease, type 2 diabetes, diastolic heart failure, CHF, hypertension and a recently discovered possible pancreatic mass in the head of the pancreas Patient History Information obtained from Patient. Allergies Altace (Reaction: cough) Family History Unknown History. Social History Former smoker - quit 12 years ago, Marital Status - Divorced, Alcohol Use - Never, Drug Use - No History, Caffeine Use - Rarely. Medical History Cardiovascular Patient has history of Congestive Heart Failure, Coronary Artery Disease, Hypertension Endocrine Patient has history of Type II Diabetes Denies history of Type I Diabetes Neurologic Patient has history of Seizure Disorder Oncologic Denies history of Received Chemotherapy, Received Radiation Patient is treated with Insulin. Blood sugar is tested. Medical A Surgical History Notes nd Cardiovascular CVA, Carotid stenosis Genitourinary CKD stage 3 Neurologic Parkinson's, left hemiparesis, CVA Oncologic prostate cancer with mets to bone, bladder cancer Review of Systems (ROS) Constitutional Symptoms (General Health) Denies complaints or symptoms of Fatigue, Fever, Chills, Marked Weight Change. Eyes Denies complaints or symptoms of Dry Eyes, Vision Changes, Glasses / Contacts. Ear/Nose/Mouth/Throat Denies complaints or symptoms of Chronic sinus problems or rhinitis. Respiratory Denies complaints or symptoms of Chronic or frequent coughs, Shortness of Breath. Gastrointestinal Denies complaints or symptoms of Frequent diarrhea, Nausea, Vomiting. Integumentary (Skin) Complains or has symptoms of Wounds - wound on left gluteus. Musculoskeletal Complains or  has symptoms of Muscle Weakness. Denies complaints or symptoms of Muscle Pain. Psychiatric Denies complaints or symptoms of Claustrophobia, Suicidal. Objective Constitutional Patient is hypertensive.. Pulse regular and within target range for patient.Marland Kitchen Respirations regular, non-labored and within target range.. Temperature is normal and within the target range for the patient.Marland Kitchen Appears in no distress. Vitals Time Taken: 3:03 PM, Height: 64 in, Source: Stated, Weight: 150 lbs, Source: Stated, BMI: 25.7, Temperature: 97.5 F, Pulse: 58 bpm, Respiratory Rate: 18 breaths/min, Blood Pressure: 146/69 mmHg, Capillary Blood Glucose: 107 mg/dl. General Notes: glucose per pt report Genitourinary (GU)  Indwelling Foley catheter.. Musculoskeletal Has some periwound dermatitis they are using zinc oxide on this no evidence of soft tissue infection around the wound. General Notes: Wound exam; the area in question is on the left buttock in close proximity to the gluteal cleft and perianal area. The wound has significant depth and goes towards the ischial tuberosity but I do not appreciate any palpable bone there is no surrounding obvious infection nothing that would lend itself to a culture. If there was a fistulous connection to this wound at 1 point its not appreciable now and I am not really planning to track this down. The patient has nice healthy looking granulation at the wound orifice this becomes less healthy looking as you going to the depths of the wound with adherent yellow slough. No debridement was felt to be necessary. Integumentary (Hair, Skin) Wound #1 status is Open. Original cause of wound was Gradually Appeared. The wound is located on the Left Gluteus. The wound measures 3.2cm length x 2.4cm width x 2.2cm depth; 6.032cm^2 area and 13.27cm^3 volume. There is Fat Layer (Subcutaneous Tissue) exposed. There is no undermining noted, however, there is tunneling at 10:00 with a maximum distance  of 1cm. There is a medium amount of serosanguineous drainage noted. The wound margin is fibrotic, thickened scar. There is medium (34-66%) red granulation within the wound bed. There is a medium (34-66%) amount of necrotic tissue within the wound bed including Adherent Slough. Assessment Active Problems ICD-10 Cutaneous abscess of buttock Unspecified open wound of left buttock, subsequent encounter Plan Follow-up Appointments: Return appointment in 3 weeks. Dressing Change Frequency: Wound #1 Left Gluteus: Change dressing every day. Skin Barriers/Peri-Wound Care: Barrier cream - as needed Wound Cleansing: Wound #1 Left Gluteus: Clean wound with Wound Cleanser Primary Wound Dressing: Wound #1 Left Gluteus: Santyl Ointment Secondary Dressing: Wound #1 Left Gluteus: Other: - pack lightly with saline moistened gauze Off-Loading: Turn and reposition every 2 hours Other: - up in wheelchair no more that 2 hour increments Home Health: [facility] skilled nursing for wound care. - [facility] The following medication(s) was prescribed: Santyl topical 250 unit/gram ointment ointment topical to wound left buttock change daily and prn starting 07/27/2020 1. I think the wound clearly started as an abscess if you listen to the history provided by his wife. I am not totally clear when this opened and I will have to see if there is relevant cultures from the hospitalization. 2. The CT scan suggested a fistulous connection with this and deeper tissues I think in his perineum however if that were true I do not appreciate that now. 3. I think the wound itself is being miraculously cared for by his wife. I agree with the Santyl backing wet-to-dry dressings. When I see him next in 3 weeks I will attempt a mechanical debridement. 4. This does not go to bone there was no osteomyelitis identified in the CT scan that he had in the ischial tuberosity itself. I do not think antibiotics  are currently necessary 5. He has a hospital bed with a pressure relief surface and a pressure relief surface for his wheelchair although he is in bed most of the time I have encouraged him to stay off his back and buttock is much as possible 5. Mild to moderate hypoalbuminemia when he left the hospital although his wife says he is eating well. Follow-up in 3 weeks. They have home health his wife is doing an excellent job caring for him. I do not think  any other imaging studies currently are necessary. I spent 35 minutes in review of this patient's past medical history face-to-face evaluation and preparation of this record Electronic Signature(s) Signed: 07/27/2020 6:16:54 PM By: Linton Ham MD Entered By: Linton Ham on 07/27/2020 17:46:14 -------------------------------------------------------------------------------- HxROS Details Patient Name: Date of Service: [name] 07/27/2020 2:45 PM Medical Record Number: [id number] Patient Account Number: [id number] Date of Birth/Sex: Treating RN: 1946/05/30 (74 y.o. [name] Primary Care Provider: [name] Other Clinician: Referring Provider: Treating Provider/Extender: [name] in Treatment: 0 Information Obtained From Patient Constitutional Symptoms (General Health) Complaints and Symptoms: Negative for: Fatigue; Fever; Chills; Marked Weight Change Eyes Complaints and Symptoms: Negative for: Dry Eyes; Vision Changes; Glasses / Contacts Ear/Nose/Mouth/Throat Complaints and Symptoms: Negative for: Chronic sinus problems or rhinitis Respiratory Complaints and Symptoms: Negative for: Chronic or frequent coughs; Shortness of Breath Gastrointestinal Complaints and Symptoms: Negative for: Frequent diarrhea; Nausea; Vomiting Integumentary (Skin) Complaints and Symptoms: Positive for: Wounds - wound on left gluteus Musculoskeletal Complaints and Symptoms: Positive for: Muscle  Weakness Negative for: Muscle Pain Psychiatric Complaints and Symptoms: Negative for: Claustrophobia; Suicidal Hematologic/Lymphatic Cardiovascular Medical History: Positive for: Congestive Heart Failure; Coronary Artery Disease; Hypertension Past Medical History Notes: CVA, Carotid stenosis Endocrine Medical History: Positive for: Type II Diabetes Negative for: Type I Diabetes Time with diabetes: 5 years ago Treated with: Insulin Blood sugar tested every day: Yes Tested : once a day Genitourinary Medical History: Past Medical History Notes: CKD stage 3 Immunological Neurologic Medical History: Positive for: Seizure Disorder Past Medical History Notes: Parkinson's, left hemiparesis, CVA Oncologic Medical History: Negative for: Received Chemotherapy; Received Radiation Past Medical History Notes: prostate cancer with mets to bone, bladder cancer Immunizations Pneumococcal Vaccine: Received Pneumococcal Vaccination: Yes Implantable Devices None Family and Social History Unknown History: Yes; Former smoker - quit 12 years ago; Marital Status - Divorced; Alcohol Use: Never; Drug Use: No History; Caffeine Use: Rarely; Financial Concerns: No; Food, Clothing or Shelter Needs: No; Support System Lacking: No; Transportation Concerns: No Engineer, maintenance) Signed: 07/27/2020 5:57:53 PM By: Levan Hurst RN, BSN Signed: 07/27/2020 6:16:54 PM By: Linton Ham MD Entered By: Levan Hurst on 07/27/2020 15:28:14 -------------------------------------------------------------------------------- [place] Details Patient Name: Date of Service: Alycia Patten T. 07/27/2020 Medical Record Number: [id number] Patient Account Number: [id number] Date of Birth/Sex: Treating RN: 02-26-1946 (74 y.o. Ernestene Mention Primary Care Provider: [name] Other Clinician: Referring Provider: Treating Provider/Extender: [name] in Treatment:  0 Diagnosis Coding ICD-10 Codes Code Description L02.31 Cutaneous abscess of buttock S31.829D Unspecified open wound of left buttock, subsequent encounter Facility Procedures CPT4 Code: [id number] Description: 99214 - WOUND CARE VISIT-LEV 4 EST PT Modifier: Quantity: 1 Physician Procedures : CPT4 Code Description Modifier [id number] WC PHYS LEVEL 3 NEW PT ICD-10 Diagnosis Description L02.31 Cutaneous abscess of buttock S31.829D Unspecified open wound of left buttock, subsequent encounter Quantity: 1 Electronic Signature(s) Signed: 07/27/2020 6:16:54 PM By: Linton Ham MD Signed: 07/27/2020 6:20:53 PM By: Baruch Gouty RN, BSN Entered By: Baruch Gouty on 07/27/2020 18:04:26

## 2020-08-02 ENCOUNTER — Encounter: Payer: Self-pay | Admitting: Medical Oncology

## 2020-08-03 ENCOUNTER — Ambulatory Visit: Payer: Medicare Other | Admitting: Oncology

## 2020-08-06 ENCOUNTER — Ambulatory Visit: Payer: Medicare Other | Admitting: Oncology

## 2020-08-07 ENCOUNTER — Other Ambulatory Visit: Payer: Self-pay

## 2020-08-07 ENCOUNTER — Inpatient Hospital Stay: Payer: Medicare Other | Attending: Oncology | Admitting: Oncology

## 2020-08-07 ENCOUNTER — Encounter: Payer: Self-pay | Admitting: Medical Oncology

## 2020-08-07 VITALS — BP 106/55 | HR 58 | Temp 97.7°F | Resp 18 | Ht 64.0 in

## 2020-08-07 DIAGNOSIS — Z7982 Long term (current) use of aspirin: Secondary | ICD-10-CM | POA: Insufficient documentation

## 2020-08-07 DIAGNOSIS — C7951 Secondary malignant neoplasm of bone: Secondary | ICD-10-CM | POA: Insufficient documentation

## 2020-08-07 DIAGNOSIS — Z79899 Other long term (current) drug therapy: Secondary | ICD-10-CM | POA: Insufficient documentation

## 2020-08-07 DIAGNOSIS — C61 Malignant neoplasm of prostate: Secondary | ICD-10-CM | POA: Insufficient documentation

## 2020-08-07 NOTE — Progress Notes (Signed)
Introduced myself to patient and his wife as the prostate nurse navigator and discussed my role.He started ADT 9/29 with Dr.Winter and referred to Dr. [name], 10/5 to discuss radiation. Dr. [name] did not see the benefit  for radiation at his time and requested consult with Dr. Alen Blew to discuss systemic therapy. Per Dr. Alen Blew, he recommends patient continue ADT, until he develops castrate resistance, then add systemic therapy. I gave them my business card and asked them to call me if I can answer questions.

## 2020-08-07 NOTE — Progress Notes (Signed)
Hematology and Oncology Follow Up Visit  [name] [id number] 1946/06/16 74 y.o. 08/07/2020 10:56 AM [name], MDWolters, Luis Booty, MD   Principle Diagnosis: 74 year old with castration-sensitive advanced prostate cancer diagnosed in July 2021.  Resented with a Gleason score 4+5 = 9 PSA 107 and bone involvement.   Prior Therapy: He status post prostate biopsy completed by [name] on May 04, 2020.  He was found to have a Gleason score 4+5 = 9.  Current therapy: Androgen deprivation therapy started on July 03, 2020.  Last treatment given October 2021 and will be repeated in 6 months under the care of [name].  Interim History: [name] returns today for a follow-up visit.  He is a 74 year old man is on consultation during his hospitalization in September 2021.  Since his discharge, he has been limited to mobility and confined to bed or stretcher.  He is currently dealing with a rectal abscess with changing of dressing regularly.  His mobility is very limited and occasionally able to transfer from bed to a wheelchair but cannot say there for longer period time.     Medications: I have reviewed the patient's current medications.  Current Outpatient Medications  Medication Sig Dispense Refill  . acetaminophen (TYLENOL) 325 MG tablet Take 2 tablets (650 mg total) by mouth every 6 (six) hours as needed for mild pain (or Fever >/= 101).    Marland Kitchen amLODipine (NORVASC) 10 MG tablet TAKE ONE TABLET BY MOUTH DAILY (Patient taking differently: Take 10 mg by mouth daily. ) 90 tablet 2  . carbidopa-levodopa (SINEMET IR) 25-100 MG tablet Take 2 tablets by mouth 3 (three) times daily. Change: Take Two tabs 3 x daily 540 tablet 3  . collagenase (SANTYL) ointment Apply topically 2 (two) times daily. 15 g 0  . dantrolene (DANTRIUM) 50 MG capsule TAKE ONE CAPSULE BY MOUTH TWICE A DAY 180 capsule 1  . dipyridamole-aspirin (AGGRENOX) 200-25 MG 12hr capsule TAKE ONE CAPSULE BY MOUTH TWICE A  DAY (Patient taking differently: Take 1 capsule by mouth 2 (two) times daily. ) 180 capsule 1  . ezetimibe (ZETIA) 10 MG tablet TAKE ONE TABLET BY MOUTH DAILY 90 tablet 3  . fenofibrate (TRICOR) 48 MG tablet TAKE ONE TABLET BY MOUTH DAILY 90 tablet 3  . FOLBIC 2.5-25-2 MG TABS tablet Take 1 tablet by mouth once daily with food (Patient taking differently: Take 1 tablet by mouth daily. ) 90 tablet 0  . furosemide (LASIX) 20 MG tablet Take 20 mg by mouth daily.    . hydrALAZINE (APRESOLINE) 25 MG tablet Take 25 mg by mouth 1 day or 1 dose.     . insulin glargine (LANTUS) 100 UNIT/ML Solostar Pen Inject 10 Units into the skin daily. 3 mL 0  . insulin lispro (HUMALOG KWIKPEN) 100 UNIT/ML KiwkPen Inject 4 Units into the skin 3 (three) times daily as needed (high blood sugar > 200).     Marland Kitchen KEPPRA 500 MG tablet TAKE ONE TABLET BY MOUTH TWICE A DAY 180 tablet 1  . metoprolol tartrate (LOPRESSOR) 25 MG tablet TAKE ONE TABLET BY MOUTH DAILY (Patient taking differently: Take 25 mg by mouth daily. ) 90 tablet 2  . nitroGLYCERIN (NITROSTAT) 0.4 MG SL tablet Place 1 tablet (0.4 mg total) under the tongue every 5 (five) minutes as needed for chest pain. (Patient not taking: Reported on 07/17/2020) 25 tablet 3  . olmesartan (BENICAR) 20 MG tablet Take 20 mg by mouth daily.    Jonetta Speak  LANCETS 33G MISC     . ONETOUCH VERIO test strip     . oxyCODONE-acetaminophen (PERCOCET/ROXICET) 5-325 MG tablet Take by mouth every 4 (four) hours as needed for severe pain. (Patient not taking: Reported on 07/17/2020)    . RAPAFLO 8 MG CAPS capsule Take 8 mg by mouth daily with breakfast.     . rosuvastatin (CRESTOR) 20 MG tablet TAKE ONE TABLET BY MOUTH DAILY 90 tablet 3   No current facility-administered medications for this visit.     Allergies:  Allergies  Allergen Reactions  . Altace [Ramipril] Cough      Physical Exam: Temperature 97.7 F (36.5 C), temperature source Oral, resp. rate 18, height 5\' 4"   (1.626 m).   ECOG: 3   General appearance: Comfortable appearing without any discomfort Head: Normocephalic without any trauma Oropharynx: Mucous membranes are moist and pink without any thrush or ulcers. Eyes: Pupils are equal and round reactive to light. Lymph nodes: No cervical, supraclavicular, inguinal or axillary lymphadenopathy.   Heart:regular rate and rhythm.  S1 and S2 without leg edema. Lung: Clear without any rhonchi or wheezes.  No dullness to percussion. Abdomin: Soft, nontender, nondistended with good bowel sounds.  No hepatosplenomegaly. Musculoskeletal: No joint deformity or effusion.  Full range of motion noted. Neurological: No deficits noted on motor, sensory and deep tendon reflex exam. Skin: No petechial rash or dryness.  Appeared moist.     Lab Results: Lab Results  Component Value Date   WBC 8.0 06/26/2020   HGB 10.0 (L) 06/26/2020   HCT 31.9 (L) 06/26/2020   MCV 96.1 06/26/2020   PLT 270 06/26/2020     Chemistry      Component Value Date/Time   NA 139 06/26/2020 0833   NA 141 08/18/2018 1529   K 4.8 06/26/2020 0833   CL 103 06/26/2020 0833   CO2 22 06/26/2020 0833   BUN 25 (H) 06/26/2020 0833   BUN 40 (H) 08/18/2018 1529   CREATININE 1.11 06/26/2020 0833   CREATININE 1.27 03/04/2018 1434   CREATININE 1.11 07/13/2013 0814      Component Value Date/Time   CALCIUM 9.5 06/26/2020 0833   ALKPHOS 71 06/26/2020 0833   AST 14 (L) 06/26/2020 0833   AST 22 03/04/2018 1434   ALT 6 06/26/2020 0833   ALT 6 03/04/2018 1434   BILITOT 0.6 06/26/2020 0833   BILITOT <0.2 (L) 03/04/2018 1434        Impression and Plan:   74 year old with:  1.  Advanced prostate cancer with disease to the bone diagnosed in July 2021.  He presented with castration-sensitive disease and a PSA of 107.  He received androgen deprivation therapy under the care of [name] in September 2021 after prolonged hospitalization in September.   The natural course of this  disease was reviewed treatment options were discussed.  Any treatment moving forward is palliative given his advanced disease.  Androgen deprivation therapy remains the cornerstone of treating advanced prostate cancer.  The role of additional therapy utilizing abiraterone, enzalutamide and systemic chemotherapy among other options were reviewed.  It is the standard of care to escalate therapy beyond androgen deprivation if the patient can tolerated.  Given the multitude of comorbidities at this time he would be a marginal candidate for any additional therapy at this time.  His life expectancy is limited because of other comorbidities in addition to prostate cancer.  Additional therapy at this time could likely increase the risk of complications and likely decrease life  expectancy.  At this point, I recommended proceeding with androgen deprivation therapy alone and deferring any treatment unless he develops symptomatic progression in the future.   2. Androgen depravation: He is currently receiving it under the care of [name] try recommended continuing indefinitely.    3  Follow-up: In 6 months for repeat evaluation.  30  minutes were dedicated to this visit. The time was spent on reviewing laboratory data, imaging studies, discussing treatment options, and answering questions regarding future plan.   Zola Button, MD 10/26/202110:56 AM

## 2020-08-10 ENCOUNTER — Other Ambulatory Visit: Payer: Medicare Other | Admitting: Nurse Practitioner

## 2020-08-10 ENCOUNTER — Other Ambulatory Visit: Payer: Self-pay

## 2020-08-10 DIAGNOSIS — C61 Malignant neoplasm of prostate: Secondary | ICD-10-CM

## 2020-08-10 DIAGNOSIS — Z515 Encounter for palliative care: Secondary | ICD-10-CM

## 2020-08-10 NOTE — Progress Notes (Signed)
[facility] Consult Note Telephone: [phone]  Fax: [phone]  PATIENT NAME: [name] [place] [phone] [phone] (home)  DOB: 1946/06/19 MRN: [id number]  PRIMARY CARE PROVIDER:    [name], MD,  [address] [place] [address] [phone]  Dr. Eldridge Scot (Oncologist)  REFERRING PROVIDER:   [name], MD [address] [address] [place],  [place] [address] [phone]  RESPONSIBLE PARTY:   Extended Emergency Contact Information Primary Emergency Contact: [name],[name] Address: [address]          [place], [place] [address] [name] of [place] Phone: [phone] Mobile Phone: [phone] Relation: Relative Secondary Emergency Contact: [place] Mobile Phone: [phone] Relation: Daughter  I met face to face with patient and family in home.  ASSESSMENT AND RECOMMENDATIONS:   1. Advance Care Planning/Goals of Care: (reviewed from prior discussion notes) Goal of care: Patient's goal of care is function and comfort. Patient desires to remain in his home and live as long as he can possibly live. Family expressed desire to keep caring for patient at home. Directives: Patient code status is full code. Patient desires full resuscitation effort in the event of cardiac or respiratory arrest. Patient report beating the odds each time he was given a grimme prognosis. Family and patient report he was told in 1988 that he would never walk again and he walked. MOST form in house and on [facility] EMR. MOST form details include; CPR, Full scope of treatment, antibiotics if indicated, IV fluids if indicated and feeding tube for a defined trial period.   2. Symptom Management: Patient diagnosed with prostatecancerwith mets to bone, pelvis, and spine. Plan is for palliative hormone therapy. On Androgen  deprivation therapy started by Dr. Lovena Neighbours on 07/03/2020, last treatment on Oct. 2021, plan is to repeat every 6 months. Family report receiving a call 2 days ago, informing them that patient PSA dropped from 107 to 2.6. Patient and family happy about the news, believed its a promising sign. Nutrition: Family report good appetite, reporting patient eats 100% of his meals. No report of nausea, vomiting or uncontrolled pain. Visually, patient appeared to have gained weight, no recent weight reported. Wife report moisture associated skin breakdown in patient's groin area. She report washing the area with soap and water, pat to dry and applied moisture barrier cream with zinc on it, she report improvement in area.  Patient has wound to fifth toe of left foot, area is the size of pencil eraser. Wife report some drainage to the site. Site followed by home health nurse, order placed for silvadene cream to be applied to site. Close monitoring recommended given patient diagnosis for diabetes. Family asked if patient needed oral antibiotics, no indication for systemic antibiotics noted on exam to day, no redness, swelling, or increased drainage. Family to call if worsening symptoms. Recommend patient float his heels while in bed. Palliative care will continue to provide support to patient, family and medical team, due to patient's complex disease issues he is at increased risk for skin breakdown with infection, gradual weight loss, dysphagia, aspiration, vascular compromise leading to repeat CVA, MI, death. These risk will remain and will progress over time due to advancing age.  3. Follow up Palliative Care Visit: Palliative care will continue to follow for goals of care clarification and symptom management. Return in about 4 weeks or prn.  4. Family /Caregiver/Community Supports:  Patient lives at home with his ex-wife who is his main caregiver. Ex-wife very knowledgeable about patient condition and coordinates his  care. Patient has supportive family who assist with his care. Has a daughter in-law who is a Equities trader.  5. Cognitive / Functional decline: Patient awake and alert, coherent, able to make his own decisions. He appeared more upbeat today. He is dependent on family for all of his ADLs, able to feed self. Non-ambulatory due to cancer metastasis to his spine. Transfers with a slide board to wheelchair.  I spent 50 minutes providing this consultation, time includes time spent with patient and family, chart review, and documentation. More than 50% of the time in this consultation was spent coordinating communication.   HISTORY OF PRESENT ILLNESS:Luis T Cheshireis a 74 y.o.year old malewith multiple medical problems including Prostate cancer with mets, CHF (EF 60-65%), Parkinson disease,Type 2 diabetes, seizure disorder, left arm paralysis from prior CVA, Chronic wound in buttocks with tuneling. Palliative Care was asked to follow this patient by consultation request ofWolters, Ivin Booty, MDto help address advance care planning and goals of care. This is a follow up visit form 07/11/2020.   CODE STATUS: Full code  PPS: 30%  HOSPICE ELIGIBILITY/DIAGNOSIS: TBD  PAST MEDICAL HISTORY:  Past Medical History:  Diagnosis Date  . At high risk for falls   . Bladder cancer [facility])    urologist-  dr Gaynelle Arabian  . CAD (coronary artery disease) cardiologist-  dr berry  . Cerebrovascular arteriosclerosis   . Gait disturbance, post-stroke    uses cane, rollator, and w/c long distance  . Hemiparkinsonism [facility]) neurologist-- dr Leta Baptist   right side body  . History of basal cell carcinoma excision    Sept 2016--  MOH's surgery side of nose  . History of carotid artery stenosis cardiologist-- dr berry   bilateral --  1999 s/p  bilateral ICA endarterectomy and recurrent restenosis 2012  s/p  staged bilateral stenting   per last duplex 2015  stents widely patent  . History of CVA with residual deficit  neurologist-  dr Leta Baptist   3/ 1999  Right MCA  and  12/ 2004  Anterior division of  Right MCA ---  residual left spactic hemiparesis and left foot drop  . History of MI (myocardial infarction)    02/ 2004   s/p  cabg   . HOH (hard of hearing)    LEFT EAR  POST cva  . Hyperextension deformity of left knee    wears brace  . Hyperlipidemia   . Hypertension   . Left foot drop    residual from CVA  -- wears brace  . Left spastic hemiparesis ([place])    residual CVA 1999  . Pancreatic pseudocyst   . Peripheral arterial disease ([place])   . Prostate cancer ([place])   . S/P CABG x 4    02/ 2004  . Seizure disorder [facility]) last seizure 2011 due to confusion   neurologist-  dr Leta Baptist-- per note seizure documented 2008 breakthrough partial complex seizure (prior tonic-clonic seizure's post CVA)  . Type 2 diabetes, diet controlled ([place])     2  . Visual neglect    LEFT EYE    SOCIAL HX:  Social History   Tobacco Use  . Smoking status: Former Smoker    Packs/day: 0.25    Years: 5.00    Pack years: 1.25    Types: Cigarettes    Quit date: 11/14/2007    Years since quitting: 12.7  .  Smokeless tobacco: Never Used  Substance Use Topics  . Alcohol use: No   FAMILY HX:  Family History  Problem Relation Age of Onset  . Stroke Father   . Heart attack Father   . Stroke Mother   . Diabetes Brother   . Hyperlipidemia Brother   . Hypertension Brother   . Hyperlipidemia Sister   . Hypertension Sister   . Breast cancer Neg Hx   . Colon cancer Neg Hx   . Pancreatic cancer Neg Hx   . Prostate cancer Neg Hx     ALLERGIES:  Allergies  Allergen Reactions  . Altace [Ramipril] Cough     PERTINENT MEDICATIONS:  Outpatient Encounter Medications as of 08/10/2020  Medication Sig  . acetaminophen (TYLENOL) 325 MG tablet Take 2 tablets (650 mg total) by mouth every 6 (six) hours as needed for mild pain (or Fever >/= 101).  Marland Kitchen amLODipine (NORVASC) 10 MG tablet TAKE ONE TABLET BY MOUTH DAILY (Patient  taking differently: Take 10 mg by mouth daily. )  . carbidopa-levodopa (SINEMET IR) 25-100 MG tablet Take 2 tablets by mouth 3 (three) times daily. Change: Take Two tabs 3 x daily  . collagenase (SANTYL) ointment Apply topically 2 (two) times daily.  . dantrolene (DANTRIUM) 50 MG capsule TAKE ONE CAPSULE BY MOUTH TWICE A DAY  . dipyridamole-aspirin (AGGRENOX) 200-25 MG 12hr capsule TAKE ONE CAPSULE BY MOUTH TWICE A DAY (Patient taking differently: Take 1 capsule by mouth 2 (two) times daily. )  . ezetimibe (ZETIA) 10 MG tablet TAKE ONE TABLET BY MOUTH DAILY  . fenofibrate (TRICOR) 48 MG tablet TAKE ONE TABLET BY MOUTH DAILY  . FOLBIC 2.5-25-2 MG TABS tablet Take 1 tablet by mouth once daily with food (Patient taking differently: Take 1 tablet by mouth daily. )  . furosemide (LASIX) 20 MG tablet Take 20 mg by mouth daily.  . hydrALAZINE (APRESOLINE) 25 MG tablet Take 25 mg by mouth 1 day or 1 dose.   . insulin glargine (LANTUS) 100 UNIT/ML Solostar Pen Inject 10 Units into the skin daily.  . insulin lispro (HUMALOG KWIKPEN) 100 UNIT/ML KiwkPen Inject 4 Units into the skin 3 (three) times daily as needed (high blood sugar > 200).   Marland Kitchen KEPPRA 500 MG tablet TAKE ONE TABLET BY MOUTH TWICE A DAY  . metoprolol tartrate (LOPRESSOR) 25 MG tablet TAKE ONE TABLET BY MOUTH DAILY (Patient taking differently: Take 25 mg by mouth daily. )  . nitroGLYCERIN (NITROSTAT) 0.4 MG SL tablet Place 1 tablet (0.4 mg total) under the tongue every 5 (five) minutes as needed for chest pain. (Patient not taking: Reported on 07/17/2020)  . olmesartan (BENICAR) 20 MG tablet Take 20 mg by mouth daily.  Letta Pate DELICA LANCETS 33G MISC   . ONETOUCH VERIO test strip   . oxyCODONE-acetaminophen (PERCOCET/ROXICET) 5-325 MG tablet Take by mouth every 4 (four) hours as needed for severe pain. (Patient not taking: Reported on 07/17/2020)  . RAPAFLO 8 MG CAPS capsule Take 8 mg by mouth daily with breakfast.   . rosuvastatin (CRESTOR) 20  MG tablet TAKE ONE TABLET BY MOUTH DAILY   No facility-administered encounter medications on file as of 08/10/2020.    PHYSICAL EXAM / ROS:   General:  frail appearing,  lying in bed in NAD Cardiovascular: denied chest pain, denied palpitation, no edema Pulmonary: denied cough, denied SOB, room air GI: no swallowing issues, appetite good, denied constipation, incontinent of bowel GU: denies dysuria, chronic foley- urine yellow and clear  MSK:  Left arm contracture and paralysis, non-ambulatory Skin: wound to left buttocks (dressing intact), wound to fifth toe of left foot (undertreament) Neurological: Weakness, but otherwise nonfocal  Jari Favre, DNP, AGPCNP-BC

## 2020-08-14 ENCOUNTER — Telehealth: Payer: Self-pay

## 2020-08-14 NOTE — Telephone Encounter (Signed)
Returned call per Dr Alen Blew to patient. Patient was advised to call the office with any changes in condition. Patient verbalized understanding.

## 2020-08-14 NOTE — Telephone Encounter (Signed)
-----   Message from Wyatt Portela, MD sent at 08/14/2020 12:34 PM EDT ----- No evaluation is needed.  Just monitor with symptoms.  Thanks ----- Message ----- From: Kennedy Bucker, LPN Sent: 10/19/2089  12:21 PM EDT To: Wyatt Portela, MD  Danae Chen with [facility] called stated patient's wife found a large lump under his right arm pit and states the lump is not causing the patient pain or discomfort. And wants to know if patient needs to come in and be seen or to do tele health visit.  Please advise.   Kim LPN

## 2020-08-16 ENCOUNTER — Other Ambulatory Visit: Payer: Self-pay

## 2020-08-16 ENCOUNTER — Encounter (HOSPITAL_BASED_OUTPATIENT_CLINIC_OR_DEPARTMENT_OTHER): Payer: Medicare Other | Attending: Internal Medicine | Admitting: Internal Medicine

## 2020-08-16 DIAGNOSIS — S91105A Unspecified open wound of left lesser toe(s) without damage to nail, initial encounter: Secondary | ICD-10-CM | POA: Insufficient documentation

## 2020-08-16 DIAGNOSIS — S31829A Unspecified open wound of left buttock, initial encounter: Secondary | ICD-10-CM | POA: Insufficient documentation

## 2020-08-16 DIAGNOSIS — L0231 Cutaneous abscess of buttock: Secondary | ICD-10-CM | POA: Insufficient documentation

## 2020-08-16 DIAGNOSIS — L89611 Pressure ulcer of right heel, stage 1: Secondary | ICD-10-CM | POA: Diagnosis not present

## 2020-08-16 DIAGNOSIS — X58XXXA Exposure to other specified factors, initial encounter: Secondary | ICD-10-CM | POA: Diagnosis not present

## 2020-08-16 DIAGNOSIS — E11621 Type 2 diabetes mellitus with foot ulcer: Secondary | ICD-10-CM | POA: Diagnosis not present

## 2020-08-16 DIAGNOSIS — C61 Malignant neoplasm of prostate: Secondary | ICD-10-CM | POA: Insufficient documentation

## 2020-08-16 DIAGNOSIS — I69354 Hemiplegia and hemiparesis following cerebral infarction affecting left non-dominant side: Secondary | ICD-10-CM | POA: Insufficient documentation

## 2020-08-16 DIAGNOSIS — L97528 Non-pressure chronic ulcer of other part of left foot with other specified severity: Secondary | ICD-10-CM | POA: Insufficient documentation

## 2020-08-16 DIAGNOSIS — C7951 Secondary malignant neoplasm of bone: Secondary | ICD-10-CM | POA: Diagnosis not present

## 2020-08-16 NOTE — Progress Notes (Signed)
CJ, EDGELL T ([id number]) . Visit Report for 08/16/2020 Debridement Details Patient Name: Date of Service: [name], [name] [facility] T. 08/16/2020 2:15 PM Medical Record Number: [id number] Patient Account Number: [id number] Date of Birth/Sex: Treating RN: 02-05-1946 (74 y.o. [name] Primary Care Provider: [name] Other Clinician: Referring Provider: Treating Provider/Extender: [name] in Treatment: 2 Debridement Performed for Assessment: Wound #2 Left,Lateral T Fourth oe Performed By: Clinician Baruch Gouty, RN Debridement Type: Chemical/Enzymatic/Mechanical Agent Used: Santyl Severity of Tissue Pre Debridement: Limited to breakdown of skin Level of Consciousness (Pre-procedure): Awake and Alert Pre-procedure Verification/Time Out Yes - 15:15 Taken: Bleeding: None Response to Treatment: Procedure was tolerated well Level of Consciousness (Post- Awake and Alert procedure): Post Debridement Measurements of Total Wound Length: (cm) 0.4 Width: (cm) 0.4 Depth: (cm) 0.1 Volume: (cm) 0.013 Character of Wound/Ulcer Post Debridement: Requires Further Debridement Severity of Tissue Post Debridement: Fat layer exposed Post Procedure Diagnosis Same as Pre-procedure Electronic Signature(s) Signed: 08/16/2020 5:37:49 PM By: Linton Ham MD Signed: 08/16/2020 6:08:00 PM By: Deon Pilling Entered By: Deon Pilling on 08/16/2020 15:17:14 -------------------------------------------------------------------------------- HPI Details Patient Name: Date of Service: Luis Pence HN T. 08/16/2020 2:15 PM Medical Record Number: [id number] Patient Account Number: [id number] Date of Birth/Sex: Treating RN: Jun 02, 1946 (74 y.o. [name] Primary Care Provider: [name] Other Clinician: Referring Provider: Treating Provider/Extender: [name] in Treatment: 2 History of Present Illness HPI Description:  ADMISSION 07/27/2020 This is a 74 year old man who is accompanied by his wife (ex). Nevertheless she is his active caregiver. His most complicated features are remote CVA that left him with a left hemiparesis. He also has widely metastatic prostate cancer to bone which I think is contributed to increasing frailty this year and he is no longer ambulatory. He required hospitalization from 9/7 through 9/14 with respiratory failure seizures. During this time it was noted that he had an open area in the left buttock in close proximity to the gluteal cleft. His wife tells me that he has a history of perirectal abscesses. And she describes this area as starting as a painful swelling in late August certainly sounds like an abscess. When he is in the hospital he required bedside IandD's but did not go to the OR. Has CT scan of the area showed soft tissue thickening in the perianal ulcer. At that point it was concerning for a fistula connection however a discrete abscess was not seen. Notable that he was hypoalbuminemic in the hospital but actually came out better with an albumin of 3.1 on 9/14 his wife says he is eating well. They are currently treating this with Santyl ointment and a backing wet-to-dry dressing. By description of his wife this is done quite nicely and there is certainly less debris over the wound surface. They already have a hospital bed with a level 3 pressure relief surface. The patient lies on his back when he is in bed especially at night although his wife is counseled him not to do this. They are starting sliding board transfers into his wheelchair I think with physical therapy. He has a Foley catheter in place Past medical history includes remote CVA, metastatic prostate cancer to large areas of his thoracic and lumbar spine and pelvis., Bilateral carotid artery stenosis, Parkinson's disease, type 2 diabetes, diastolic heart failure, CHF, hypertension and a recently discovered possible  pancreatic mass in the head of the pancreas 08/16/2020 patient I admitted to the clinic 2-1/2 weeks ago. He had an abscess  site on his left buttock that required an IandD while he was in the hospital in September. He has been using Santyl backing wet-to-dry. He is cared for her aerobically at home by his wife 2 concerning areas on his feet. A stage I pressure injury on the right heel this does not have an open wound. He also has on the lateral aspect of the left fourth toe a dry circular eschared wound. Electronic Signature(s) Signed: 08/16/2020 5:37:49 PM By: Linton Ham MD Entered By: Linton Ham on 08/16/2020 15:59:01 -------------------------------------------------------------------------------- Physical Exam Details Patient Name: Date of Service: Luis Pence HN T. 08/16/2020 2:15 PM Medical Record Number: [id number] Patient Account Number: [id number] Date of Birth/Sex: Treating RN: 06/01/1946 (74 y.o. [name] Primary Care Provider: [name] Other Clinician: Referring Provider: Treating Provider/Extender: [name] in Treatment: 2 Constitutional Sitting or standing Blood Pressure is within target range for patient.. Pulse regular and within target range for patient.Marland Kitchen Respirations regular, non-labored and within target range.Marland Kitchen Appears in no distress. Cardiovascular Pedal pulses absent bilaterally.. Notes Wound exam; the area in question is on the left buttock close proximity to the gluteal cleft. There is a clean well granulated wound although there is still some depth to it it measures better this week. There is no surrounding erythema. I cannot see anything here that really requires Santyl will change to silver collagen to see if we can stimulate some more granulation His wife shows has 2 other areas, firstly on the lateral aspect of the fourth toe and the fourth fifth web space a dry circular area. I did not debride this although I  think there is more to this at this point. Also a stage I pressure area on the right heel. There is no open wound here Electronic Signature(s) Signed: 08/16/2020 5:37:49 PM By: Linton Ham MD Entered By: Linton Ham on 08/16/2020 16:00:56 -------------------------------------------------------------------------------- Physician Orders Details Patient Name: Date of Service: Luis Pence HN T. 08/16/2020 2:15 PM Medical Record Number: [id number] Patient Account Number: [id number] Date of Birth/Sex: Treating RN: 1946-04-12 (74 y.o. [name] Primary Care Provider: [name] Other Clinician: Referring Provider: Treating Provider/Extender: [name] in Treatment: 2 Verbal / Phone Orders: No Diagnosis Coding ICD-10 Coding Code Description L02.31 Cutaneous abscess of buttock S31.829D Unspecified open wound of left buttock, subsequent encounter Follow-up Appointments Return appointment in 1 month. Dressing Change Frequency Change dressing every day. Skin Barriers/Peri-Wound Care Barrier cream - as needed Wound Cleansing Wound #1 Left Gluteus Clean wound with Wound Cleanser Primary Wound Dressing Wound #1 Left Gluteus Silver Collagen - backed with saline gauze. Wound #2 Left,Lateral T Fourth oe Santyl Ointment Secondary Dressing Wound #1 Left Gluteus Dry Gauze - or bordered foam. Wound #2 Left,Lateral T Fourth oe Kerlix/Rolled Gauze Dry Gauze Off-Loading Turn and reposition every 2 hours Other: - up in wheelchair no more that 2 hour increments continue using bunny boots for heel protection. [facility] skilled nursing for wound care. - [facility] Electronic Signature(s) Signed: 08/16/2020 5:37:49 PM By: Linton Ham MD Signed: 08/16/2020 6:08:00 PM By: Deon Pilling Entered By: Deon Pilling on 08/16/2020 15:15:52 -------------------------------------------------------------------------------- Problem  List Details Patient Name: Date of Service: Luis Pence HN T. 08/16/2020 2:15 PM Medical Record Number: [id number] Patient Account Number: [id number] Date of Birth/Sex: Treating RN: 1946-07-24 (74 y.o. [name] Primary Care Provider: [name] Other Clinician: Referring Provider: Treating Provider/Extender: [name] in Treatment: 2 Active Problems ICD-10 Encounter Code  Description Active Date MDM Diagnosis L02.31 Cutaneous abscess of buttock 07/27/2020 No Yes S31.829D Unspecified open wound of left buttock, subsequent encounter 07/27/2020 No Yes L97.528 Non-pressure chronic ulcer of other part of left foot with other specified 08/16/2020 No Yes severity L89.611 Pressure ulcer of right heel, stage 1 08/16/2020 No Yes Inactive Problems Resolved Problems Electronic Signature(s) Signed: 08/16/2020 5:37:49 PM By: Linton Ham MD Entered By: Linton Ham on 08/16/2020 15:42:16 -------------------------------------------------------------------------------- Progress Note Details Patient Name: Date of Service: Luis Pence HN T. 08/16/2020 2:15 PM Medical Record Number: [id number] Patient Account Number: [id number] Date of Birth/Sex: Treating RN: 1946-01-08 (74 y.o. [name] Primary Care Provider: [name] Other Clinician: Referring Provider: Treating Provider/Extender: [name] in Treatment: 2 Subjective History of Present Illness (HPI) ADMISSION 07/27/2020 This is a 74 year old man who is accompanied by his wife (ex). Nevertheless she is his active caregiver. His most complicated features are remote CVA that left him with a left hemiparesis. He also has widely metastatic prostate cancer to bone which I think is contributed to increasing frailty this year and he is no longer ambulatory. He required hospitalization from 9/7 through 9/14 with respiratory failure seizures. During this time it was  noted that he had an open area in the left buttock in close proximity to the gluteal cleft. His wife tells me that he has a history of perirectal abscesses. And she describes this area as starting as a painful swelling in late August certainly sounds like an abscess. When he is in the hospital he required bedside IandD's but did not go to the OR. Has CT scan of the area showed soft tissue thickening in the perianal ulcer. At that point it was concerning for a fistula connection however a discrete abscess was not seen. Notable that he was hypoalbuminemic in the hospital but actually came out better with an albumin of 3.1 on 9/14 his wife says he is eating well. They are currently treating this with Santyl ointment and a backing wet-to-dry dressing. By description of his wife this is done quite nicely and there is certainly less debris over the wound surface. They already have a hospital bed with a level 3 pressure relief surface. The patient lies on his back when he is in bed especially at night although his wife is counseled him not to do this. They are starting sliding board transfers into his wheelchair I think with physical therapy. He has a Foley catheter in place Past medical history includes remote CVA, metastatic prostate cancer to large areas of his thoracic and lumbar spine and pelvis., Bilateral carotid artery stenosis, Parkinson's disease, type 2 diabetes, diastolic heart failure, CHF, hypertension and a recently discovered possible pancreatic mass in the head of the pancreas 08/16/2020 patient I admitted to the clinic 2-1/2 weeks ago. He had an abscess site on his left buttock that required an IandD while he was in the hospital in September. He has been using Santyl backing wet-to-dry. He is cared for her aerobically at home by his wife 2 concerning areas on his feet. A stage I pressure injury on the right heel this does not have an open wound. He also has on the lateral aspect of the  left fourth toe a dry circular eschared wound. Allergies Altace (Reaction: cough), cephalexin (Reaction: itching) Objective Constitutional Sitting or standing Blood Pressure is within target range for patient.. Pulse regular and within target range for patient.Marland Kitchen Respirations regular, non-labored and within target range.Marland Kitchen Appears in no distress.  Vitals Time Taken: 2:20 PM, Height: 64 in, Weight: 150 lbs, BMI: 25.7, Temperature: 97.8 F, Pulse: 68 bpm, Respiratory Rate: 18 breaths/min, Blood Pressure: 123/72 mmHg, Capillary Blood Glucose: 115 mg/dl. Cardiovascular Pedal pulses absent bilaterally.. General Notes: Wound exam; the area in question is on the left buttock close proximity to the gluteal cleft. There is a clean well granulated wound although there is still some depth to it it measures better this week. There is no surrounding erythema. I cannot see anything here that really requires Santyl will change to silver collagen to see if we can stimulate some more granulation ooHis wife shows has 2 other areas, firstly on the lateral aspect of the fourth toe and the fourth fifth web space a dry circular area. I did not debride this although I think there is more to this at this point. ooAlso a stage I pressure area on the right heel. There is no open wound here Integumentary (Hair, Skin) Wound #1 status is Open. Original cause of wound was Gradually Appeared. The wound is located on the Left Gluteus. The wound measures 1cm length x 1.6cm width x 1.5cm depth; 1.257cm^2 area and 1.885cm^3 volume. There is Fat Layer (Subcutaneous Tissue) exposed. There is no tunneling or undermining noted. There is a medium amount of serosanguineous drainage noted. The wound margin is fibrotic, thickened scar. There is large (67-100%) red granulation within the wound bed. There is a small (1-33%) amount of necrotic tissue within the wound bed including Adherent Slough. Wound #2 status is Open. Original cause of  wound was Gradually Appeared. The wound is located on the Left,Lateral T Fourth. The wound measures 0.4cm oe length x 0.4cm width x 0.1cm depth; 0.126cm^2 area and 0.013cm^3 volume. There is no tunneling or undermining noted. There is a small amount of serosanguineous drainage noted. The wound margin is flat and intact. There is no granulation within the wound bed. There is a large (67-100%) amount of necrotic tissue within the wound bed including Adherent Slough. Assessment Active Problems ICD-10 Cutaneous abscess of buttock Unspecified open wound of left buttock, subsequent encounter Non-pressure chronic ulcer of other part of left foot with other specified severity Pressure ulcer of right heel, stage 1 Procedures Wound #2 Pre-procedure diagnosis of Wound #2 is a Diabetic Wound/Ulcer of the Lower Extremity located on the Left,Lateral T Fourth .Severity of Tissue Pre oe Debridement is: Limited to breakdown of skin. There was a Chemical/Enzymatic/Mechanical debridement performed by Baruch Gouty, RN.Marland Kitchen Agent used was [employer]. A time out was conducted at 15:15, prior to the start of the procedure. There was no bleeding. The procedure was tolerated well. Post Debridement Measurements: 0.4cm length x 0.4cm width x 0.1cm depth; 0.013cm^3 volume. Character of Wound/Ulcer Post Debridement requires further debridement. Severity of Tissue Post Debridement is: Fat layer exposed. Post procedure Diagnosis Wound #2: Same as Pre-Procedure Plan Follow-up Appointments: Return appointment in 1 month. Dressing Change Frequency: Change dressing every day. Skin Barriers/Peri-Wound Care: Barrier cream - as needed Wound Cleansing: Wound #1 Left Gluteus: Clean wound with Wound Cleanser Primary Wound Dressing: Wound #1 Left Gluteus: Silver Collagen - backed with saline gauze. Wound #2 Left,Lateral T Fourth: oe Santyl Ointment Secondary Dressing: Wound #1 Left Gluteus: Dry Gauze - or bordered  foam. Wound #2 Left,Lateral T Fourth: oe Kerlix/Rolled Gauze Dry Gauze Off-Loading: Turn and reposition every 2 hours Other: - up in wheelchair no more that 2 hour increments continue using bunny boots for heel protection. Home Health: [facility] skilled [facility] for  wound care. - [facility] 1. I change the primary dressing to the left buttock abscess site wound to silver collagen backing wet-to-dry with border foam 2. I have asked his wife to carefully offload both feet which includes the right heel and separation of the fourth and fifth toes on the left I think this is injury mostly related to an adequate separation of the fourth and fifth toes 3. I suspect the patient has significant PAD. He does not have popliteal pulses bilaterally nor does he have pedal pulses bilaterally. I am going to use Santyl on the fourth toe wound on the left Electronic Signature(s) Signed: 08/16/2020 5:37:49 PM By: Linton Ham MD Entered By: Linton Ham on 08/16/2020 16:02:01 -------------------------------------------------------------------------------- SuperBill Details Patient Name: Date of Service: Luis Pence HN T. 08/16/2020 Medical Record Number: [id number] Patient Account Number: [id number] Date of Birth/Sex: Treating RN: 04/15/1946 (74 y.o. [name], [name] Primary Care Provider: [name] Other Clinician: Referring Provider: Treating Provider/Extender: [name] in Treatment: 2 Diagnosis Coding ICD-10 Codes Code Description L02.31 Cutaneous abscess of buttock S31.829D Unspecified open wound of left buttock, subsequent encounter L97.528 Non-pressure chronic ulcer of other part of left foot with other specified severity L89.611 Pressure ulcer of right heel, stage 1 Facility Procedures CPT4 Code: [id number] Description: [id number] - DEBRIDE W/O ANES NON SELECT Modifier: Quantity: 1 Physician Procedures : CPT4 Code Description Modifier [id number]  [id number] - WC PHYS LEVEL 3 - EST PT ICD-10 Diagnosis Description L02.31 Cutaneous abscess of buttock S31.829D Unspecified open wound of left buttock, subsequent encounter L97.528 Non-pressure chronic ulcer of other  part of left foot with other specified severity L89.611 Pressure ulcer of right heel, stage 1 Quantity: 1 Electronic Signature(s) Signed: 08/16/2020 5:37:49 PM By: Linton Ham MD Entered By: Linton Ham on 08/16/2020 16:02:30

## 2020-08-17 NOTE — Progress Notes (Signed)
MUZAMMIL, BRUINS T ([id number]) . Visit Report for 08/16/2020 Allergy List Details Patient Name: Date of Service: LOXLEY, SCHMALE [facility] T. 08/16/2020 2:15 PM Medical Record Number: [id number] Patient Account Number: [id number] Date of Birth/Sex: Treating RN: [birth year] (74 y.o. [name] Primary Care Anita Mcadory: [name] Other Clinician: Referring Lille Karim: Treating Paulmichael Schreck/Extender: [name] in Treatment: 2 Allergies Active Allergies Altace Reaction: cough cephalexin Reaction: itching Allergy Notes Electronic Signature(s) Signed: 08/16/2020 5:54:11 PM By: Levan Hurst RN, BSN Entered By: Levan Hurst on 08/16/2020 14:44:22 -------------------------------------------------------------------------------- Arrival Information Details Patient Name: Date of Service: [name] 08/16/2020 2:15 PM Medical Record Number: [id number] Patient Account Number: [id number] Date of Birth/Sex: Treating RN: 1946-02-14 (74 y.o. [name], [name] Primary Care Artisha Capri: [name] Other Clinician: Referring Viveka Wilmeth: Treating Shadiamond Koska/Extender: [name] in Treatment: 2 Visit Information Patient Arrived: Stretcher Arrival Time: 14:19 Accompanied By: caregiver Transfer Assistance: Manual Patient Identification Verified: Yes Secondary Verification Process Completed: Yes Patient Requires Transmission-Based Precautions: No Patient Has Alerts: Yes Patient Alerts: Patient on Blood Thinner History Since Last Visit Added or deleted any medications: No Any new allergies or adverse reactions: No Had a fall or experienced change in activities of daily living that may affect risk of falls: No Signs or symptoms of abuse/neglect since last visito No Hospitalized since last visit: No Implantable device outside of the clinic excluding cellular tissue based products placed in the center since last visit: No Has Dressing in Place as  Prescribed: Yes Pain Present Now: No Electronic Signature(s) Signed: 08/17/2020 8:27:14 AM By: Sandre Kitty Entered By: Sandre Kitty on 08/16/2020 14:19:26 -------------------------------------------------------------------------------- Encounter Discharge Information Details Patient Name: Date of Service: [name] 08/16/2020 2:15 PM Medical Record Number: [id number] Patient Account Number: [id number] Date of Birth/Sex: Treating RN: [birth year] (74 y.o. [name] Primary Care Nhan Qualley: [name] Other Clinician: Referring Kweli Grassel: Treating Chung Chagoya/Extender: [name] in Treatment: 2 Encounter Discharge Information Items Post Procedure Vitals Discharge Condition: Stable Temperature (F): 97.8 Ambulatory Status: Stretcher Pulse (bpm): 68 Discharge Destination: Home Respiratory Rate (breaths/min): 18 Transportation: Private Auto Blood Pressure (mmHg): 123/72 Accompanied By: caregiver Schedule Follow-up Appointment: Yes Clinical Summary of Care: Patient Declined Electronic Signature(s) Signed: 08/16/2020 5:54:11 PM By: Levan Hurst RN, BSN Entered By: Levan Hurst on 08/16/2020 17:46:30 -------------------------------------------------------------------------------- [place] Details Patient Name: Date of Service: [name] 08/16/2020 2:15 PM Medical Record Number: [id number] Patient Account Number: [id number] Date of Birth/Sex: Treating RN: 06-17-1946 (74 y.o. [name] Primary Care Averee Harb: [name] Other Clinician: Referring Sheria Rosello: Treating Lucus Lambertson/Extender: [name] in Treatment: 2 Active Inactive Nutrition Nursing Diagnoses: Potential for alteratiion in Nutrition/Potential for imbalanced nutrition Goals: Patient/caregiver agrees to and verbalizes understanding of need to use nutritional supplements and/or vitamins as  prescribed Date Initiated: 07/27/2020 T arget Resolution Date: 09/14/2020 Goal Status: Active Interventions: Assess patient nutrition upon admission and as needed per policy Treatment Activities: Patient referred to Primary Care Physician for further nutritional evaluation : 07/27/2020 Notes: Wound/Skin Impairment Nursing Diagnoses: Impaired tissue integrity Knowledge deficit related to ulceration/compromised skin integrity Goals: Patient/caregiver will verbalize understanding of skin care regimen Date Initiated: 07/27/2020 Target Resolution Date: 09/14/2020 Goal Status: Active Ulcer/skin breakdown will have a volume reduction of 30% by week 4 Date Initiated: 07/27/2020 Target Resolution Date: 08/24/2020 Goal Status: Active Interventions: Assess patient/caregiver ability to obtain necessary supplies Assess patient/caregiver ability to perform ulcer/skin care regimen upon admission and as needed Assess ulceration(s) every  visit Provide education on ulcer and skin care Treatment Activities: Skin care regimen initiated : 07/27/2020 Topical wound management initiated : 07/27/2020 Notes: Electronic Signature(s) Signed: 08/16/2020 6:08:00 PM By: Deon Pilling Entered By: Deon Pilling on 08/16/2020 14:29:25 -------------------------------------------------------------------------------- Pain Assessment Details Patient Name: Date of Service: Alycia Patten T. 08/16/2020 2:15 PM Medical Record Number: [id number] Patient Account Number: [id number] Date of Birth/Sex: Treating RN: 11-09-1945 (74 y.o. [name] Primary Care Kalid Ghan: [name] Other Clinician: Referring Roland Lipke: Treating Margret Moat/Extender: [name] in Treatment: 2 Active Problems Location of Pain Severity and Description of Pain Patient Has Paino No Site Locations Pain Management and Medication Current Pain Management: Electronic Signature(s) Signed: 08/16/2020 6:08:00  PM By: Deon Pilling Signed: 08/17/2020 8:27:14 AM By: Sandre Kitty Entered By: Sandre Kitty on 08/16/2020 14:21:09 -------------------------------------------------------------------------------- Patient/Caregiver Education Details Patient Name: Date of Service: Alycia Patten T. 11/4/2021andnbsp2:15 PM Medical Record Number: [id number] Patient Account Number: [id number] Date of Birth/Gender: Treating RN: 1946-04-17 (74 y.o. [name] Primary Care Physician: [name] Other Clinician: Referring Physician: Treating Physician/Extender: [name] in Treatment: 2 Education Assessment Education Provided To: Patient Education Topics Provided Wound/Skin Impairment: Handouts: Skin Care Do's and Dont's Methods: Explain/Verbal Responses: Reinforcements needed Electronic Signature(s) Signed: 08/16/2020 6:08:00 PM By: Deon Pilling Entered By: Deon Pilling on 08/16/2020 14:29:54 -------------------------------------------------------------------------------- Wound Assessment Details Patient Name: Date of Service: Alycia Patten T. 08/16/2020 2:15 PM Medical Record Number: [id number] Patient Account Number: [id number] Date of Birth/Sex: Treating RN: 05-16-1946 (74 y.o. [name], [name] Primary Care Aric Jost: [name] Other Clinician: Referring Skyla Champagne: Treating Mathew Storck/Extender: [name] in Treatment: 2 Wound Status Wound Number: 1 Primary Abscess Etiology: Wound Location: Left Gluteus Wound Open Wounding Event: Gradually Appeared Status: Date Acquired: 05/30/2019 Comorbid Congestive Heart Failure, Coronary Artery Disease, Weeks Of Treatment: 2 History: Hypertension, Type II Diabetes, Seizure Disorder Clustered Wound: No Wound Measurements Length: (cm) 1 Width: (cm) 1.6 Depth: (cm) 1.5 Area: (cm) 1.257 Volume: (cm) 1.885 % Reduction in Area: 79.2% % Reduction in Volume:  85.8% Epithelialization: Small (1-33%) Tunneling: No Undermining: No Wound Description Classification: Full Thickness Without Exposed Support Structures Wound Margin: Fibrotic scar, thickened scar Exudate Amount: Medium Exudate Type: Serosanguineous Exudate Color: red, brown Foul Odor After Cleansing: No Slough/Fibrino Yes Wound Bed Granulation Amount: Large (67-100%) Exposed Structure Granulation Quality: Red Fascia Exposed: No Necrotic Amount: Small (1-33%) Fat Layer (Subcutaneous Tissue) Exposed: Yes Necrotic Quality: Adherent Slough Tendon Exposed: No Muscle Exposed: No Joint Exposed: No Bone Exposed: No Treatment Notes Wound #1 (Left Gluteus) 1. Cleanse With Wound Cleanser 3. Primary Dressing Applied [employer] Other primary dressing (specifiy in notes) 4. Secondary Dressing Foam Border Dressing Notes saline moisten gauze over collagen Electronic Signature(s) Signed: 08/16/2020 5:54:11 PM By: Levan Hurst RN, BSN Signed: 08/16/2020 6:08:00 PM By: Deon Pilling Entered By: Levan Hurst on 08/16/2020 14:45:12 -------------------------------------------------------------------------------- Wound Assessment Details Patient Name: Date of Service: [name] 08/16/2020 2:15 PM Medical Record Number: [id number] Patient Account Number: [id number] Date of Birth/Sex: Treating RN: Mar 14, 1946 (74 y.o. [name] Primary Care Brailynn Breth: [name] Other Clinician: Referring Sweet Jarvis: Treating Alvis Pulcini/Extender: [name] in Treatment: 2 Wound Status Wound Number: 2 Primary Diabetic Wound/Ulcer of the Lower Extremity Etiology: Wound Location: Left, Lateral T Fourth oe Wound Open Wounding Event: Gradually Appeared Status: Date Acquired: 08/16/2020 Comorbid Congestive Heart Failure, Coronary Artery Disease, Weeks Of Treatment: 0 History: Hypertension, Type II Diabetes, Seizure Disorder Clustered Wound: No  Wound  Measurements Length: (cm) 0.4 Width: (cm) 0.4 Depth: (cm) 0.1 Area: (cm) 0.126 Volume: (cm) 0.013 % Reduction in Area: 0% % Reduction in Volume: 0% Epithelialization: None Tunneling: No Undermining: No Wound Description Classification: Unable to visualize wound bed Wound Margin: Flat and Intact Exudate Amount: Small Exudate Type: Serosanguineous Exudate Color: red, brown Foul Odor After Cleansing: No Slough/Fibrino Yes Wound Bed Granulation Amount: None Present (0%) Exposed Structure Necrotic Amount: Large (67-100%) Fascia Exposed: No Necrotic Quality: Adherent Slough Fat Layer (Subcutaneous Tissue) Exposed: No Tendon Exposed: No Muscle Exposed: No Joint Exposed: No Bone Exposed: No Treatment Notes Wound #2 (Left, Lateral Toe Fourth) 1. Cleanse With Wound Cleanser 3. Primary Dressing Applied Santyl 4. Secondary Dressing Dry Gauze Roll Gauze 5. Secured With Recruitment consultant) Signed: 08/16/2020 5:54:11 PM By: Levan Hurst RN, BSN Signed: 08/16/2020 6:08:00 PM By: Deon Pilling Entered By: Levan Hurst on 08/16/2020 14:45:50 -------------------------------------------------------------------------------- Vitals Details Patient Name: Date of Service: [name] 08/16/2020 2:15 PM Medical Record Number: [id number] Patient Account Number: [id number] Date of Birth/Sex: Treating RN: May 08, 1946 (74 y.o. [name] Primary Care Tequila Rottmann: [name] Other Clinician: Referring Audwin Semper: Treating Yakov Bergen/Extender: [name] in Treatment: 2 Vital Signs Time Taken: 14:20 Temperature (F): 97.8 Height (in): 64 Pulse (bpm): 68 Weight (lbs): 150 Respiratory Rate (breaths/min): 18 Body Mass Index (BMI): 25.7 Blood Pressure (mmHg): 123/72 Capillary Blood Glucose (mg/dl): 115 Reference Range: 80 - 120 mg / dl Electronic Signature(s) Signed: 08/17/2020 8:27:14 AM By: Sandre Kitty Entered By: Sandre Kitty on 08/16/2020 14:21:19

## 2020-09-06 ENCOUNTER — Other Ambulatory Visit: Payer: Self-pay | Admitting: Cardiovascular Disease

## 2020-09-07 ENCOUNTER — Other Ambulatory Visit: Payer: Self-pay

## 2020-09-07 ENCOUNTER — Other Ambulatory Visit: Payer: Medicare Other | Admitting: Nurse Practitioner

## 2020-09-07 DIAGNOSIS — C61 Malignant neoplasm of prostate: Secondary | ICD-10-CM

## 2020-09-07 DIAGNOSIS — C7951 Secondary malignant neoplasm of bone: Secondary | ICD-10-CM

## 2020-09-07 DIAGNOSIS — Z515 Encounter for palliative care: Secondary | ICD-10-CM

## 2020-09-07 NOTE — Progress Notes (Signed)
[facility] Consult Note Telephone: [phone]  Fax: [phone]  PATIENT NAME: [name] [place] [id number] [phone] (home)  DOB: 06/21/1946 MRN: [id number]  PRIMARY CARE PROVIDER:    [name], MD,  [place] [address] [phone]  REFERRING PROVIDER:   [name], MD [address] [place],  [place] [address] [phone]  RESPONSIBLE PARTY:   Extended Emergency Contact Information Primary Emergency Contact: [name],Luis K Address: [address]          [place],  [address] [name] of [place] Phone: [phone] Mobile Phone: [phone] Relation: Relative Secondary Emergency Contact: [name] Mobile Phone: [phone] Relation: Daughter  I met face to face with patient and family in home. Present during visit are 2 grandchildren, son and daughter-in-law and Ex-wife.  ASSESSMENT AND RECOMMENDATIONS:   Advance Care Planning: Goal of care: Patient's goal of care is function. Patient goal is to regain as much function as possible, he desires to remain in his home and live as long as he can possibly live. Directives: (Reviewed prior discussion) Patient code status is full code. Patient desires full resuscitation effort in the event of cardiac or respiratory arrest. Patient report beating the odds each time he was given a grimme prognosis. Family and patient report he was told in 1988 that he would never walk again and he walked. MOST form in house and on [facility] EMR. MOST formdetails include; CPR, Full scope of treatment, antibiotics if indicated, IV fluids if indicated and feeding tube for a defined trial period.   2. Symptom Management:  Hypotension: Family report patient having hypotensive episodes, reporting SBP mostly in the 90s, has one episode of 80s. Patient  asymptomatic, denied lightheadedness or dizziness. Family report patient PCP and cardiologist aware of complaint. Patient currently on Amlodipine 10mg  daily, Hydralazine 25mg  daily, Lopressor 25mg  daily, Benicar 20mg  daily. Of note, Hydralazine was decreased from three times a day to once day. Family to notify his cardiology of the new BP readings, family to hold Hydralazine for now. BP checked manually today 110/60, HR 62. Lump in right axilla: Condition is new in the last 3 weeks. Family report mass is getting smaller. Oncologist aware and recommended monitoring. Mass is soft and non tender to touch, no redness or evidence or inflammation noted. Family to notify provider promptly of any change. Provided general support and encouragement, no other unmet needs identified.  3. Follow up Palliative Care Visit: Palliative care will continue to follow for goals of care clarification and symptom management. Return in 6-8 weeks or prn.  4. Family /Caregiver/Community Supports: Patient lives at [employer] his ex-wifewhois his main caregiver. Ex-wife very knowledgeable about patient condition and coordinates his care. Patient has a daughter in-law who is a Equities trader, she assist with his care.   5. Cognitive / Functional decline: Patient awake and alert, coherent, able to make his own decisions.He is dependent on family for all of his ADLs,able to feed self. Report improvement in his function, now uses slide board to self transfer from bed to wheel chair with stand by assist. He is now continent of his bowels. Family report patient goes out in the porch when weather permits. Participated in Thanksgiving celebration with children and grandchildren.  I spent 48 minutes providing this consultation, time includes time spent with patient/family, chart review, provider coordination, and documentation. More than 50% of the  time in this consultation was spent coordinating communication.   CHIEF COMPLAINT:  Follow up Palliative care visit from 08/10/2020  HISTORY OF PRESENT ILLNESS:Luis T Cheshireis a 74 y.o.year old malewith multiple medical problems including Prostate cancerwith mets, CHF (EF 60-65%), Parkinson disease,Type 2 diabetes, seizure disorder, left arm paralysis from prior CVA. Chronic wound to right buttocks followed by wound clinic improving. Palliative Care was asked to help address advance care planning and goals of care.   CODE STATUS: Full code  PPS: 30%  HOSPICE ELIGIBILITY/DIAGNOSIS: TBD  PHYSICAL EXAM / ROS:  V/S: BP 110/60, HR 61, RR 16, oxygen saturation 98% on room air Current and past weights: no current weight reported, Family report patient appeared to have gained weight General: NAD, frail appearing, lying in bed participation in visit discussion Cardiovascular: denied chest pain, no edema  Pulmonary: no cough, no SOB, room air GI: appetite good, denied constipation, continent of bowel GU: denies dysuria, chronic foley- urine yellow and clear MSK:  Left arm contracture and paralysis, non-ambulatory Skin: wound to left buttocks(dressing intact),wound to fifth toe of left foot (undertreament)  Neurological: Weakness  PAST MEDICAL HISTORY:  Past Medical History:  Diagnosis Date  . At high risk for falls   . Bladder cancer [facility])    urologist-  Luis [name]  . CAD (coronary artery disease) cardiologist-  [name]  . Cerebrovascular arteriosclerosis   . Gait disturbance, post-stroke    uses cane, rollator, and w/c long distance  . Hemiparkinsonism [facility]) neurologist-- Luis [name]   right side body  . History of basal cell carcinoma excision    Sept 2016--  MOH's surgery side of nose  . History of carotid artery stenosis cardiologist-- [name]   bilateral --  1999 s/p  bilateral ICA endarterectomy and recurrent restenosis 2012  s/p  staged bilateral stenting   per last duplex 2015  stents widely patent  . History of CVA with residual deficit  neurologist-  Luis [name]   3/ 1999  Right MCA  and  12/ 2004  Anterior division of  Right MCA ---  residual left spactic hemiparesis and left foot drop  . History of MI (myocardial infarction)    02/ 2004   s/p  cabg   . HOH (hard of hearing)    LEFT EAR  POST cva  . Hyperextension deformity of left knee    wears brace  . Hyperlipidemia   . Hypertension   . Left foot drop    residual from CVA  -- wears brace  . Left spastic hemiparesis ([place])    residual CVA 1999  . Pancreatic pseudocyst   . Peripheral arterial disease ([place])   . Prostate cancer ([place])   . S/P CABG x 4    02/ 2004  . Seizure disorder [facility]) last seizure 2011 due to confusion   neurologist-  Luis [name]-- per note seizure documented 2008 breakthrough partial complex seizure (prior tonic-clonic seizure's post CVA)  . Type 2 diabetes, diet controlled ([place])     2  . Visual neglect    LEFT EYE    SOCIAL HX:  Social History   Tobacco Use  . Smoking status: Former Smoker    Packs/day: 0.25    Years: 5.00    Pack years: 1.25    Types: Cigarettes    Quit date: 11/14/2007    Years since quitting: 12.8  . Smokeless tobacco: Never Used  Substance Use Topics  . Alcohol use: No   FAMILY HX:  Family History  Problem Relation Age of Onset  . Stroke Father   . Heart attack Father   . Stroke Mother   . Diabetes Brother   . Hyperlipidemia Brother   . Hypertension Brother   . Hyperlipidemia Sister   . Hypertension Sister   . Breast cancer Neg Hx   . Colon cancer Neg Hx   . Pancreatic cancer Neg Hx   . Prostate cancer Neg Hx     ALLERGIES:  Allergies  Allergen Reactions  . Altace [Ramipril] Cough     PERTINENT MEDICATIONS:  Outpatient Encounter Medications as of 09/07/2020  Medication Sig  . acetaminophen (TYLENOL) 325 MG tablet Take 2 tablets (650 mg total) by mouth every 6 (six) hours as needed for mild pain (or Fever >/= 101).  Marland Kitchen amLODipine (NORVASC) 10 MG tablet TAKE ONE TABLET BY MOUTH DAILY (Patient  taking differently: Take 10 mg by mouth daily. )  . carbidopa-levodopa (SINEMET IR) 25-100 MG tablet Take 2 tablets by mouth 3 (three) times daily. Change: Take Two tabs 3 x daily  . collagenase (SANTYL) ointment Apply topically 2 (two) times daily.  . dantrolene (DANTRIUM) 50 MG capsule TAKE ONE CAPSULE BY MOUTH TWICE A DAY  . dipyridamole-aspirin (AGGRENOX) 200-25 MG 12hr capsule TAKE ONE CAPSULE BY MOUTH TWICE A DAY (Patient taking differently: Take 1 capsule by mouth 2 (two) times daily. )  . ezetimibe (ZETIA) 10 MG tablet TAKE ONE TABLET BY MOUTH DAILY  . fenofibrate (TRICOR) 48 MG tablet TAKE ONE TABLET BY MOUTH DAILY  . FOLBIC 2.5-25-2 MG TABS tablet Take 1 tablet by mouth once daily with food (Patient taking differently: Take 1 tablet by mouth daily. )  . furosemide (LASIX) 20 MG tablet Take 20 mg by mouth daily.  . hydrALAZINE (APRESOLINE) 25 MG tablet Take 25 mg by mouth 1 day or 1 dose.   . insulin glargine (LANTUS) 100 UNIT/ML Solostar Pen Inject 10 Units into the skin daily.  . insulin lispro (HUMALOG KWIKPEN) 100 UNIT/ML KiwkPen Inject 4 Units into the skin 3 (three) times daily as needed (high blood sugar > 200).   Marland Kitchen KEPPRA 500 MG tablet TAKE ONE TABLET BY MOUTH TWICE A DAY  . metoprolol tartrate (LOPRESSOR) 25 MG tablet Take 1 tablet (25 mg total) by mouth daily.  . nitroGLYCERIN (NITROSTAT) 0.4 MG SL tablet Place 1 tablet (0.4 mg total) under the tongue every 5 (five) minutes as needed for chest pain. (Patient not taking: Reported on 07/17/2020)  . olmesartan (BENICAR) 20 MG tablet Take 20 mg by mouth daily.  Glory Rosebush DELICA LANCETS 67E MISC   . ONETOUCH VERIO test strip   . oxyCODONE-acetaminophen (PERCOCET/ROXICET) 5-325 MG tablet Take by mouth every 4 (four) hours as needed for severe pain. (Patient not taking: Reported on 07/17/2020)  . RAPAFLO 8 MG CAPS capsule Take 8 mg by mouth daily with breakfast.   . rosuvastatin (CRESTOR) 20 MG tablet TAKE ONE TABLET BY MOUTH DAILY    No facility-administered encounter medications on file as of 09/07/2020.     Jari Favre, DNP, AGPCNP-BC

## 2020-09-13 ENCOUNTER — Encounter (HOSPITAL_BASED_OUTPATIENT_CLINIC_OR_DEPARTMENT_OTHER): Payer: Medicare Other | Attending: Internal Medicine | Admitting: Internal Medicine

## 2020-09-13 ENCOUNTER — Other Ambulatory Visit: Payer: Self-pay

## 2020-09-13 ENCOUNTER — Other Ambulatory Visit: Payer: Self-pay | Admitting: Diagnostic Neuroimaging

## 2020-09-13 DIAGNOSIS — L89312 Pressure ulcer of right buttock, stage 2: Secondary | ICD-10-CM | POA: Insufficient documentation

## 2020-09-13 DIAGNOSIS — I5032 Chronic diastolic (congestive) heart failure: Secondary | ICD-10-CM | POA: Insufficient documentation

## 2020-09-13 DIAGNOSIS — L0231 Cutaneous abscess of buttock: Secondary | ICD-10-CM | POA: Insufficient documentation

## 2020-09-13 DIAGNOSIS — L97528 Non-pressure chronic ulcer of other part of left foot with other specified severity: Secondary | ICD-10-CM | POA: Diagnosis not present

## 2020-09-13 DIAGNOSIS — Z8546 Personal history of malignant neoplasm of prostate: Secondary | ICD-10-CM | POA: Diagnosis not present

## 2020-09-13 DIAGNOSIS — G20 Parkinson's disease: Secondary | ICD-10-CM | POA: Insufficient documentation

## 2020-09-13 DIAGNOSIS — G40909 Epilepsy, unspecified, not intractable, without status epilepticus: Secondary | ICD-10-CM | POA: Diagnosis not present

## 2020-09-13 DIAGNOSIS — E11621 Type 2 diabetes mellitus with foot ulcer: Secondary | ICD-10-CM | POA: Insufficient documentation

## 2020-09-13 DIAGNOSIS — I69354 Hemiplegia and hemiparesis following cerebral infarction affecting left non-dominant side: Secondary | ICD-10-CM | POA: Insufficient documentation

## 2020-09-13 DIAGNOSIS — S31829A Unspecified open wound of left buttock, initial encounter: Secondary | ICD-10-CM | POA: Insufficient documentation

## 2020-09-13 DIAGNOSIS — I110 Hypertensive heart disease with heart failure: Secondary | ICD-10-CM | POA: Diagnosis not present

## 2020-09-13 DIAGNOSIS — X58XXXA Exposure to other specified factors, initial encounter: Secondary | ICD-10-CM | POA: Insufficient documentation

## 2020-09-13 NOTE — Progress Notes (Signed)
IDEN, STRIPLING T ([id number]) . Visit Report for 09/13/2020 Debridement Details Patient Name: Date of Service: [name], [name] [facility] T. 09/13/2020 1:15 PM Medical Record Number: [id number] Patient Account Number: [id number] Date of Birth/Sex: Treating RN: February 05, 1946 (74 y.o. [name] Primary Care Provider: [name] Other Clinician: Referring Provider: Treating Provider/Extender: [name] in Treatment: 6 Debridement Performed for Assessment: Wound #2 Left,Lateral T Fourth oe Performed By: Physician Ricard Dillon., MD Debridement Type: Debridement Severity of Tissue Pre Debridement: Fat layer exposed Level of Consciousness (Pre-procedure): Awake and Alert Pre-procedure Verification/Time Out Yes - 13:58 Taken: Start Time: 13:59 Pain Control: Lidocaine 4% T opical Solution T Area Debrided (L x W): otal 0.4 (cm) x 0.6 (cm) = 0.24 (cm) Tissue and other material debrided: Viable, Non-Viable, Slough, Subcutaneous, Skin: Dermis , Fibrin/Exudate, Slough Level: Skin/Subcutaneous Tissue Debridement Description: Excisional Instrument: Curette Bleeding: Moderate Hemostasis Achieved: Pressure End Time: 14:01 Procedural Pain: 0 Post Procedural Pain: 0 Response to Treatment: Procedure was tolerated well Level of Consciousness (Post- Awake and Alert procedure): Post Debridement Measurements of Total Wound Length: (cm) 0.4 Width: (cm) 0.6 Depth: (cm) 0.1 Volume: (cm) 0.019 Character of Wound/Ulcer Post Debridement: Improved Severity of Tissue Post Debridement: Fat layer exposed Post Procedure Diagnosis Same as Pre-procedure Electronic Signature(s) Signed: 09/13/2020 5:09:23 PM By: Linton Ham MD Signed: 09/13/2020 5:54:37 PM By: Deon Pilling Entered By: Linton Ham on 09/13/2020 14:06:43 -------------------------------------------------------------------------------- HPI Details Patient Name: Date of Service: Luis Pence [facility] T.  09/13/2020 1:15 PM Medical Record Number: [id number] Patient Account Number: [id number] Date of Birth/Sex: Treating RN: 05/15/1946 (74 y.o. [name] Primary Care Provider: [name] Other Clinician: Referring Provider: Treating Provider/Extender: [name] in Treatment: 6 History of Present Illness HPI Description: ADMISSION 07/27/2020 This is a 74 year old man who is accompanied by his wife (ex). Nevertheless she is his active caregiver. His most complicated features are remote CVA that left him with a left hemiparesis. He also has widely metastatic prostate cancer to bone which I think is contributed to increasing frailty this year and he is no longer ambulatory. He required hospitalization from 9/7 through 9/14 with respiratory failure seizures. During this time it was noted that he had an open area in the left buttock in close proximity to the gluteal cleft. His wife tells me that he has a history of perirectal abscesses. And she describes this area as starting as a painful swelling in late August certainly sounds like an abscess. When he is in the hospital he required bedside IandD's but did not go to the OR. Has CT scan of the area showed soft tissue thickening in the perianal ulcer. At that point it was concerning for a fistula connection however a discrete abscess was not seen. Notable that he was hypoalbuminemic in the hospital but actually came out better with an albumin of 3.1 on 9/14 his wife says he is eating well. They are currently treating this with Santyl ointment and a backing wet-to-dry dressing. By description of his wife this is done quite nicely and there is certainly less debris over the wound surface. They already have a hospital bed with a level 3 pressure relief surface. The patient lies on his back when he is in bed especially at night although his wife is counseled him not to do this. They are starting sliding board transfers  into his wheelchair I think with physical therapy. He has a Foley catheter in place Past medical history includes remote CVA, metastatic  prostate cancer to large areas of his thoracic and lumbar spine and pelvis., Bilateral carotid artery stenosis, Parkinson's disease, type 2 diabetes, diastolic heart failure, CHF, hypertension and a recently discovered possible pancreatic mass in the head of the pancreas 08/16/2020 patient I admitted to the clinic 2-1/2 weeks ago. He had an abscess site on his left buttock that required an IandD while he was in the hospital in September. He has been using Santyl backing wet-to-dry. He is cared for her aerobically at home by his wife 2 concerning areas on his feet. A stage I pressure injury on the right heel this does not have an open wound. He also has on the lateral aspect of the left fourth toe a dry circular eschared wound. 12/2; monthly follow-up. Is a very disabled man who had an abscess in his left buttock and required an IandD. We have been using Santyl to this area I changed to silver collagen last time he is here and this actually looks quite a bit better The last time he was here his wife showed me an area on the left lateral fourth toe I think a pressure injury with his fifth toe. Been using Santyl here as well but not making much improvement As well she shows me he has an area on his right buttock which is very tiny pinpoint area but under illumination still obviously an open area. I wonder if this is more of a shear injury and transferring and a pressure injury per se Electronic Signature(s) Signed: 09/13/2020 5:09:23 PM By: Linton Ham MD Entered By: Linton Ham on 09/13/2020 14:08:05 -------------------------------------------------------------------------------- Physical Exam Details Patient Name: Date of Service: Luis Pence HN T. 09/13/2020 1:15 PM Medical Record Number: [id number] Patient Account Number: [id number] Date of Birth/Sex:  Treating RN: 1946/01/02 (74 y.o. [name] Primary Care Provider: [name] Other Clinician: Referring Provider: Treating Provider/Extender: [name] in Treatment: 6 Constitutional Sitting or standing Blood Pressure is within target range for patient.. Pulse regular and within target range for patient.Marland Kitchen Respirations regular, non-labored and within target range.. Temperature is normal and within the target range for the patient.Marland Kitchen Appears in no distress. Notes Wound exam; the area in question on the left buttock is come down quite a bit. There is still depth here tissue looks healthy under illumination A new pinpoint area on the right buttock. Initially I was not sure that this was not a splinter however using a #3 curette thigh remove the top to layer of tissue and clearly this is a open area there was no foreign material identified. The left fourth toe is really not in good condition. #3 curette necrotic subcutaneous tissue. This does not go to bone but clearly not making progress Electronic Signature(s) Signed: 09/13/2020 5:09:23 PM By: Linton Ham MD Entered By: Linton Ham on 09/13/2020 14:09:26 -------------------------------------------------------------------------------- Physician Orders Details Patient Name: Date of Service: Luis Pence HN T. 09/13/2020 1:15 PM Medical Record Number: [id number] Patient Account Number: [id number] Date of Birth/Sex: Treating RN: [birth year] (74 y.o. [name] Primary Care Provider: [name] Other Clinician: Referring Provider: Treating Provider/Extender: [name] in Treatment: 6 Verbal / Phone Orders: No Diagnosis Coding ICD-10 Coding Code Description L02.31 Cutaneous abscess of buttock S31.829D Unspecified open wound of left buttock, subsequent encounter L97.528 Non-pressure chronic ulcer of other part of left foot with other specified  severity L89.611 Pressure ulcer of right heel, stage 1 Follow-up Appointments Return appointment in 1 month. Off-Loading Low air-loss mattress (Group  2) - CONTINUE TO USE. Turn and reposition every 2 hours Other: - CONTINUE TO USE BUNNY BOOTS FOR HEEL PROTECTION. UP IN WHEELCHAIR NO MORE THAT 2 HOUR INCREMENTS. Home Health New wound care orders this week; continue Home Health for wound care. May utilize formulary equivalent dressing for wound treatment orders unless otherwise specified. - [facility] [place]. Wound Treatment Wound #1 - Gluteus Wound Laterality: Left Cleanser: Wound Cleanser (Home Health) Every Other Day/30 Days Discharge Instructions: Cleanse the wound with wound cleanser prior to applying a clean dressing using gauze sponges, not tissue or cotton balls. Prim Dressing: Promogran Prisma Matrix, 4.34 (sq in) (silver collagen) (Home Health) Every Other Day/30 Days ary Discharge Instructions: Apply silver collagen to wound bed as instructed. BACK WITH SALINE GAUZE. Secondary Dressing: Woven Gauze Sponge, Non-Sterile 4x4 in Every Other Day/30 Days Discharge Instructions: OR BORDERED FOAM. Apply over primary dressing as directed. Wound #2 - T Fourth oe Wound Laterality: Left, Lateral Prim Dressing: Iodosorb Gel 10 (gm) Tube [facility]) Every Other Day/30 Days ary Discharge Instructions: Apply to wound bed as instructed Secondary Dressing: Woven Gauze Sponges 2x2 in [facility]) Every Other Day/30 Days Discharge Instructions: Apply over primary dressing as directed. Secured With: Child psychotherapist, Sterile 2x75 (in/in) (Home Health) Every Other Day/30 Days Discharge Instructions: Secure with stretch gauze as directed. Wound #3 - Gluteus Wound Laterality: Right Cleanser: Wound Cleanser (Home Health) Every Other Day/30 Days Discharge Instructions: Cleanse the wound with wound cleanser prior to applying a clean dressing using gauze  sponges, not tissue or cotton balls. Prim Dressing: Promogran Prisma Matrix, 4.34 (sq in) (silver collagen) (Home Health) Every Other Day/30 Days ary Discharge Instructions: Apply silver collagen to wound bed as instructed. MOISTEN WITH SALINE OR [place] JELLY. Secondary Dressing: Woven Gauze Sponge, Non-Sterile 4x4 in Every Other Day/30 Days Discharge Instructions: OR BORDERED FOAM. Apply over primary dressing as directed. Electronic Signature(s) Signed: 09/13/2020 5:09:23 PM By: Linton Ham MD Signed: 09/13/2020 5:54:37 PM By: Deon Pilling Entered By: Deon Pilling on 09/13/2020 14:11:40 -------------------------------------------------------------------------------- Problem List Details Patient Name: Date of Service: Luis Pence HN T. 09/13/2020 1:15 PM Medical Record Number: [id number] Patient Account Number: [id number] Date of Birth/Sex: Treating RN: September 10, 1946 (74 y.o. [name] Primary Care Provider: [name] Other Clinician: Referring Provider: Treating Provider/Extender: [name] in Treatment: 6 Active Problems ICD-10 Encounter Code Description Active Date MDM Diagnosis L02.31 Cutaneous abscess of buttock 07/27/2020 No Yes S31.829D Unspecified open wound of left buttock, subsequent encounter 07/27/2020 No Yes L97.528 Non-pressure chronic ulcer of other part of left foot with other specified 08/16/2020 No Yes severity L89.312 Pressure ulcer of right buttock, stage 2 09/13/2020 No Yes Inactive Problems ICD-10 Code Description Active Date Inactive Date L89.611 Pressure ulcer of right heel, stage 1 08/16/2020 08/16/2020 Resolved Problems Electronic Signature(s) Signed: 09/13/2020 5:09:23 PM By: Linton Ham MD Entered By: Linton Ham on 09/13/2020 14:06:13 -------------------------------------------------------------------------------- Progress Note Details Patient Name: Date of Service: Luis Pence HN T. 09/13/2020 1:15  PM Medical Record Number: [id number] Patient Account Number: [id number] Date of Birth/Sex: Treating RN: 07-Mar-1946 (74 y.o. [name] Primary Care Provider: [name] Other Clinician: Referring Provider: Treating Provider/Extender: [name] in Treatment: 6 Subjective History of Present Illness (HPI) ADMISSION 07/27/2020 This is a 74 year old man who is accompanied by his wife (ex). Nevertheless she is his active caregiver. His most complicated features are remote CVA that left him with a left hemiparesis. He also  has widely metastatic prostate cancer to bone which I think is contributed to increasing frailty this year and he is no longer ambulatory. He required hospitalization from 9/7 through 9/14 with respiratory failure seizures. During this time it was noted that he had an open area in the left buttock in close proximity to the gluteal cleft. His wife tells me that he has a history of perirectal abscesses. And she describes this area as starting as a painful swelling in late August certainly sounds like an abscess. When he is in the hospital he required bedside IandD's but did not go to the OR. Has CT scan of the area showed soft tissue thickening in the perianal ulcer. At that point it was concerning for a fistula connection however a discrete abscess was not seen. Notable that he was hypoalbuminemic in the hospital but actually came out better with an albumin of 3.1 on 9/14 his wife says he is eating well. They are currently treating this with Santyl ointment and a backing wet-to-dry dressing. By description of his wife this is done quite nicely and there is certainly less debris over the wound surface. They already have a hospital bed with a level 3 pressure relief surface. The patient lies on his back when he is in bed especially at night although his wife is counseled him not to do this. They are starting sliding board transfers into his  wheelchair I think with physical therapy. He has a Foley catheter in place Past medical history includes remote CVA, metastatic prostate cancer to large areas of his thoracic and lumbar spine and pelvis., Bilateral carotid artery stenosis, Parkinson's disease, type 2 diabetes, diastolic heart failure, CHF, hypertension and a recently discovered possible pancreatic mass in the head of the pancreas 08/16/2020 patient I admitted to the clinic 2-1/2 weeks ago. He had an abscess site on his left buttock that required an IandD while he was in the hospital in September. He has been using Santyl backing wet-to-dry. He is cared for her aerobically at home by his wife 2 concerning areas on his feet. A stage I pressure injury on the right heel this does not have an open wound. He also has on the lateral aspect of the left fourth toe a dry circular eschared wound. 12/2; monthly follow-up. Is a very disabled man who had an abscess in his left buttock and required an IandD. We have been using Santyl to this area I changed to silver collagen last time he is here and this actually looks quite a bit better ooThe last time he was here his wife showed me an area on the left lateral fourth toe I think a pressure injury with his fifth toe. Been using Santyl here as well but not making much improvement ooAs well she shows me he has an area on his right buttock which is very tiny pinpoint area but under illumination still obviously an open area. I wonder if this is more of a shear injury and transferring and a pressure injury per se Objective Constitutional Sitting or standing Blood Pressure is within target range for patient.. Pulse regular and within target range for patient.Marland Kitchen Respirations regular, non-labored and within target range.. Temperature is normal and within the target range for the patient.Marland Kitchen Appears in no distress. Vitals Time Taken: 1:35 PM, Height: 64 in, Weight: 150 lbs, BMI: 25.7, Temperature: 97.4  F, Pulse: 65 bpm, Respiratory Rate: 16 breaths/min, Blood Pressure: 105/59 mmHg, Capillary Blood Glucose: 114 mg/dl. General Notes: glucose per pt  report General Notes: Wound exam; the area in question on the left buttock is come down quite a bit. There is still depth here tissue looks healthy under illumination ooA new pinpoint area on the right buttock. Initially I was not sure that this was not a splinter however using a #3 curette thigh remove the top to layer of tissue and clearly this is a open area there was no foreign material identified. ooThe left fourth toe is really not in good condition. #3 curette necrotic subcutaneous tissue. This does not go to bone but clearly not making progress Integumentary (Hair, Skin) Wound #1 status is Open. Original cause of wound was Gradually Appeared. The wound is located on the Left Gluteus. The wound measures 0.6cm length x 0.3cm width x 0.6cm depth; 0.141cm^2 area and 0.085cm^3 volume. There is Fat Layer (Subcutaneous Tissue) exposed. There is no tunneling or undermining noted. There is a medium amount of serosanguineous drainage noted. The wound margin is epibole. There is large (67-100%) pink granulation within the wound bed. There is a small (1-33%) amount of necrotic tissue within the wound bed including Adherent Slough. Wound #2 status is Open. Original cause of wound was Gradually Appeared. The wound is located on the Left,Lateral T Fourth. The wound measures 0.4cm oe length x 0.6cm width x 0.1cm depth; 0.188cm^2 area and 0.019cm^3 volume. There is no tunneling or undermining noted. There is a small amount of serous drainage noted. The wound margin is flat and intact. There is no granulation within the wound bed. There is a large (67-100%) amount of necrotic tissue within the wound bed including Adherent Slough. Wound #3 status is Open. Original cause of wound was Shear/Friction. The wound is located on the Right Gluteus. The wound measures  0.2cm length x 0.2cm width x 0.3cm depth; 0.031cm^2 area and 0.009cm^3 volume. There is Fat Layer (Subcutaneous Tissue) exposed. There is no tunneling or undermining noted. There is a small amount of serosanguineous drainage noted. The wound margin is distinct with the outline attached to the wound base. There is large (67-100%) pink granulation within the wound bed. There is no necrotic tissue within the wound bed. Assessment Active Problems ICD-10 Cutaneous abscess of buttock Unspecified open wound of left buttock, subsequent encounter Non-pressure chronic ulcer of other part of left foot with other specified severity Pressure ulcer of right buttock, stage 2 Procedures Wound #2 Pre-procedure diagnosis of Wound #2 is a Diabetic Wound/Ulcer of the Lower Extremity located on the Left,Lateral T Fourth .Severity of Tissue Pre oe Debridement is: Fat layer exposed. There was a Excisional Skin/Subcutaneous Tissue Debridement with a total area of 0.24 sq cm performed by Ricard Dillon., MD. With the following instrument(s): Curette to remove Viable and Non-Viable tissue/material. Material removed includes Subcutaneous Tissue, Slough, Skin: Dermis, and Fibrin/Exudate after achieving pain control using Lidocaine 4% Topical Solution. A time out was conducted at 13:58, prior to the start of the procedure. A Moderate amount of bleeding was controlled with Pressure. The procedure was tolerated well with a pain level of 0 throughout and a pain level of 0 following the procedure. Post Debridement Measurements: 0.4cm length x 0.6cm width x 0.1cm depth; 0.019cm^3 volume. Character of Wound/Ulcer Post Debridement is improved. Severity of Tissue Post Debridement is: Fat layer exposed. Post procedure Diagnosis Wound #2: Same as Pre-Procedure Plan Follow-up Appointments: Return appointment in 1 month. Off-Loading: Low air-loss mattress (Group 2) - CONTINUE TO USE. Turn and reposition every 2 hours Other: -  CONTINUE TO USE BUNNY  BOOTS FOR HEEL PROTECTION. UP IN WHEELCHAIR NO MORE THAT 2 HOUR INCREMENTS. Home Health: New wound care orders this week; continue Home Health for wound care. May utilize formulary equivalent dressing for wound treatment orders unless otherwise specified. - [facility] [place]. WOUND #1: - Gluteus Wound Laterality: Left Cleanser: Wound Cleanser (Home Health) Every Other Day/30 Days Discharge Instructions: Cleanse the wound with wound cleanser prior to applying a clean dressing using gauze sponges, not tissue or cotton balls. Prim Dressing: Promogran Prisma Matrix, 4.34 (sq in) (silver collagen) (Home Health) Every Other Day/30 Days ary Discharge Instructions: Apply silver collagen to wound bed as instructed. BACK WITH SALINE GAUZE. Secondary Dressing: Woven Gauze Sponge, Non-Sterile 4x4 in Every Other Day/30 Days Discharge Instructions: OR BORDERED FOAM. Apply over primary dressing as directed. WOUND #2: - T Fourth Wound Laterality: Left, Lateral oe Prim Dressing: Iodosorb Gel 10 (gm) Tube [facility]) Every Other Day/30 Days ary Discharge Instructions: Apply to wound bed as instructed Secondary Dressing: Woven Gauze Sponges 2x2 in [facility]) Every Other Day/30 Days Discharge Instructions: Apply over primary dressing as directed. Secured With: Child psychotherapist, Sterile 2x75 (in/in) (Home Health) Every Other Day/30 Days Discharge Instructions: Secure with stretch gauze as directed. WOUND #3: - Gluteus Wound Laterality: Right Cleanser: Wound Cleanser (Home Health) Every Other Day/30 Days Discharge Instructions: Cleanse the wound with wound cleanser prior to applying a clean dressing using gauze sponges, not tissue or cotton balls. Prim Dressing: Promogran Prisma Matrix, 4.34 (sq in) (silver collagen) (Home Health) Every Other Day/30 Days ary Discharge Instructions: Apply silver collagen to wound bed as instructed. MOISTEN WITH  SALINE OR [place] JELLY. Secondary Dressing: Woven Gauze Sponge, Non-Sterile 4x4 in Every Other Day/30 Days Discharge Instructions: OR BORDERED FOAM. Apply over primary dressing as directed. #1 odd area on the right buttock which was new this week we will use silver collagen to this as well. Silver collagen is made nice improvements to the original right abscess IandD site. His wife says this is how the abscess started but I certainly do not see any evidence of that today 2. Left fourth toe I am going to change to Iodosorb this can be changed every 2 to 3 days. This is not cleaning up at all. Fortunately there is no exposed bone Electronic Signature(s) Signed: 09/13/2020 5:09:23 PM By: Linton Ham MD Signed: 09/13/2020 5:54:37 PM By: Deon Pilling Entered By: Deon Pilling on 09/13/2020 14:38:04 -------------------------------------------------------------------------------- SuperBill Details Patient Name: Date of Service: Alycia Patten T. 09/13/2020 Medical Record Number: [id number] Patient Account Number: [id number] Date of Birth/Sex: Treating RN: October 22, 1945 (74 y.o. [name] Primary Care Provider: [name] Other Clinician: Referring Provider: Treating Provider/Extender: [name] in Treatment: 6 Diagnosis Coding ICD-10 Codes Code Description L02.31 Cutaneous abscess of buttock S31.829D Unspecified open wound of left buttock, subsequent encounter L97.528 Non-pressure chronic ulcer of other part of left foot with other specified severity L89.312 Pressure ulcer of right buttock, stage 2 Facility Procedures CPT4 Code: [id number] Description: 56314 - DEB SUBQ TISSUE 20 SQ CM/< ICD-10 Diagnosis Description L97.528 Non-pressure chronic ulcer of other part of left foot with other specified seve Modifier: rity Quantity: 1 Physician Procedures : CPT4 Code Description Modifier [id number] - WC PHYS SUBQ TISS 20 SQ CM ICD-10 Diagnosis Description  L97.528 Non-pressure chronic ulcer of other part of left foot with other specified severity Quantity: 1 Electronic Signature(s) Signed: 09/13/2020 5:09:23 PM By: Linton Ham MD Entered By: Linton Ham  on 09/13/2020 14:10:43

## 2020-09-13 NOTE — Telephone Encounter (Signed)
Received request to refill aggrenox; patient last had tele visit 04/2019. Called caregiver who stated after hospitalization in Aug he's on palliative care, getting PT, OT.  She is unable to help him with VV, I advised a tele visit; patient is wheelchair bound at this time. Scheduled for Jan, put him on wait list. I advised agrenox will be refilled until his tele visit. She  verbalized understanding, appreciation.

## 2020-09-14 ENCOUNTER — Other Ambulatory Visit: Payer: Self-pay | Admitting: Adult Health

## 2020-09-14 NOTE — Progress Notes (Signed)
[name], [name] ([id number]) . Visit Report for 09/13/2020 Arrival Information Details Patient Name: Date of Service: [name], [name] [name]. 09/13/2020 1:15 PM Medical Record Number: [id number] Patient Account Number: [id number] Date of Birth/Sex: Treating RN: Dec 14, 1945 (74 y.o. [name] Primary Care Percy Winterrowd: [name] Other Clinician: Referring Bernal Luhman: Treating Terria Deschepper/Extender: [name] in Treatment: 6 Visit Information History Since Last Visit Added or deleted any medications: No Patient Arrived: Wheel Chair Any new allergies or adverse reactions: No Arrival Time: 13:30 Signs or symptoms of abuse/neglect since last visito No Accompanied By: ex-wife Hospitalized since last visit: No Transfer Assistance: Transfer Board Implantable device outside of the clinic excluding No Patient Requires Transmission-Based Precautions: No cellular tissue based products placed in the center Patient Has Alerts: Yes since last visit: Patient Alerts: Patient on Blood Thinner Has Dressing in Place as Prescribed: Yes Pain Present Now: No Electronic Signature(s) Signed: 09/14/2020 4:47:23 PM By: Levan Hurst RN, BSN Entered By: Levan Hurst on 09/13/2020 13:40:19 -------------------------------------------------------------------------------- Encounter Discharge Information Details Patient Name: Date of Service: [name]. 09/13/2020 1:15 PM Medical Record Number: [id number] Patient Account Number: [id number] Date of Birth/Sex: Treating RN: 13-Nov-1945 (74 y.o. Ernestene Mention Primary Care Stephania Macfarlane: [name] Other Clinician: Referring Bishop Vanderwerf: Treating Terel Bann/Extender: [name] in Treatment: 6 Encounter Discharge Information Items Post Procedure Vitals Discharge Condition: Stable Temperature (F): 97.4 Ambulatory Status: Wheelchair Pulse (bpm): 65 Discharge Destination: Home Respiratory Rate  (breaths/min): 18 Transportation: Private Auto Blood Pressure (mmHg): 105/59 Accompanied By: spouse Schedule Follow-up Appointment: Yes Clinical Summary of Care: Patient Declined Electronic Signature(s) Signed: 09/13/2020 5:52:01 PM By: Baruch Gouty RN, BSN Entered By: Baruch Gouty on 09/13/2020 14:30:37 -------------------------------------------------------------------------------- Lower Extremity Assessment Details Patient Name: Date of Service: [name]. 09/13/2020 1:15 PM Medical Record Number: [id number] Patient Account Number: [id number] Date of Birth/Sex: Treating RN: 11/05/1945 (74 y.o. [name] Primary Care Caedan Sumler: [name] Other Clinician: Referring Huda Petrey: Treating Shalea Tomczak/Extender: [name] in Treatment: 6 Edema Assessment Assessed: Shirlyn Goltz: No] Patrice Paradise: No] Edema: [Left: N] [Right: o] Vascular Assessment Pulses: Dorsalis Pedis Palpable: [Left:Yes] Electronic Signature(s) Signed: 09/14/2020 4:47:23 PM By: Levan Hurst RN, BSN Entered By: Levan Hurst on 09/13/2020 13:41:01 -------------------------------------------------------------------------------- Multi Wound Chart Details Patient Name: Date of Service: [name]. 09/13/2020 1:15 PM Medical Record Number: [id number] Patient Account Number: [id number] Date of Birth/Sex: Treating RN: October 31, 1945 (74 y.o. [name], [name] Primary Care Veta Dambrosia: [name] Other Clinician: Referring Buffy Ehler: Treating Jozalyn Baglio/Extender: [name] in Treatment: 6 Vital Signs Height(in): 64 Capillary Blood Glucose(mg/dl): 114 Weight(lbs): 150 Pulse(bpm): 37 Body Mass Index(BMI): 26 Blood Pressure(mmHg): 105/59 Temperature(F): 97.4 Respiratory Rate(breaths/min): 16 Photos: [1:No Photos Left Gluteus] [2:No Photos Left, Lateral [name] Fourth oe] [3:No Photos Right Gluteus] Wound Location: [1:Gradually Appeared]  [2:Gradually Appeared] [3:Shear/Friction] Wounding Event: [1:Abscess] [2:Diabetic Wound/Ulcer of the Lower] [3:Pressure Ulcer] Primary Etiology: [1:Congestive Heart Failure, Coronary Congestive Heart Failure, Coronary] [2:Extremity] [3:Congestive Heart Failure, Coronary] Comorbid History: [1:Artery Disease, Hypertension, Type II Artery Disease, Hypertension, Type II Diabetes, Seizure Disorder 05/30/2019] [2:Diabetes, Seizure Disorder 08/16/2020] [3:Artery Disease, Hypertension, Type II Diabetes, Seizure Disorder 09/13/2020] Date Acquired: [1:6] [2:4] [3:0] Weeks of Treatment: [1:Open] [2:Open] [3:Open] Wound Status: [1:0.6x0.3x0.6] [2:0.4x0.6x0.1] [3:0.2x0.2x0.3] Measurements L x W x D (cm) [1:0.141] [2:0.188] [3:0.031] A (cm) : rea [1:0.085] [2:0.019] [3:0.009] Volume (cm) : [1:97.70%] [2:-49.20%] [3:N/A] % Reduction in Area: [1:99.40%] [2:-46.20%] [3:N/A] % Reduction in Volume: [1:Full Thickness Without Exposed] [2:Unable to visualize wound bed] [  3:Category/Stage III] Classification: [1:Support Structures Medium] [2:Small] [3:Medium] Exudate Amount: [1:Serosanguineous] [2:Serous] [3:Serosanguineous] Exudate Type: [1:red, brown] [2:amber] [3:red, brown] Exudate Color: [1:Epibole] [2:Flat and Intact] [3:Distinct, outline attached] Wound Margin: [1:Large (67-100%)] [2:None Present (0%)] [3:Large (67-100%)] Granulation Amount: [1:Pink] [2:N/A] [3:Pink] Granulation Quality: [1:Small (1-33%)] [2:Large (67-100%)] [3:N/A] Necrotic Amount: [1:Fat Layer (Subcutaneous Tissue): Yes Fascia: No] [3:Fat Layer (Subcutaneous Tissue): Yes] Exposed Structures: [1:Fascia: No Tendon: No Muscle: No Joint: No Bone: No Medium (34-66%)] [2:Fat Layer (Subcutaneous Tissue): No Tendon: No Muscle: No Joint: No Bone: No None] [3:Fascia: No Tendon: No Muscle: No Joint: No Bone: No None] Epithelialization: [1:N/A] [2:Debridement - Excisional] [3:N/A] Debridement: Pre-procedure Verification/Time Out N/A [2:13:58]  [3:N/A] Taken: [1:N/A] [2:Lidocaine 4% Topical Solution] [3:N/A] Pain Control: [1:N/A] [2:Subcutaneous, Slough] [3:N/A] Tissue Debrided: [1:N/A] [2:Skin/Subcutaneous Tissue] [3:N/A] Level: [1:N/A] [2:0.24] [3:N/A] Debridement A (sq cm): [1:rea N/A] [2:Curette] [3:N/A] Instrument: [1:N/A] [2:Moderate] [3:N/A] Bleeding: [1:N/A] [2:Pressure] [3:N/A] Hemostasis A chieved: [1:N/A] [2:0] [3:N/A] Procedural Pain: [1:N/A] [2:0] [3:N/A] Post Procedural Pain: [1:N/A] [2:Procedure was tolerated well] [3:N/A] Debridement Treatment Response: [1:N/A] [2:0.4x0.6x0.1] [3:N/A] Post Debridement Measurements L x W x D (cm) [1:N/A] [3:6.144] [3:N/A] Post Debridement Volume: (cm) [1:N/A] [2:Debridement] [3:N/A] Treatment Notes Electronic Signature(s) Signed: 09/13/2020 5:09:23 PM By: Linton Ham MD Signed: 09/13/2020 5:54:37 PM By: Deon Pilling Entered By: Linton Ham on 09/13/2020 14:06:25 -------------------------------------------------------------------------------- Multi-Disciplinary Care Plan Details Patient Name: Date of Service: [name]. 09/13/2020 1:15 PM Medical Record Number: [id number] Patient Account Number: [id number] Date of Birth/Sex: Treating RN: [birth year] (74 y.o. [name] Primary Care Shamari Lofquist: [name] Other Clinician: Referring Mckinze Poirier: Treating Soyla Bainter/Extender: [name] in Treatment: 6 Active Inactive Wound/Skin Impairment Nursing Diagnoses: Impaired tissue integrity Knowledge deficit related to ulceration/compromised skin integrity Goals: Patient/caregiver will verbalize understanding of skin care regimen Date Initiated: 07/27/2020 Target Resolution Date: 10/12/2020 Goal Status: Active Ulcer/skin breakdown will have a volume reduction of 30% by week 4 Date Initiated: 07/27/2020 Date Inactivated: 09/13/2020 Target Resolution Date: 08/24/2020 Goal Status: Met Interventions: Assess patient/caregiver  ability to obtain necessary supplies Assess patient/caregiver ability to perform ulcer/skin care regimen upon admission and as needed Assess ulceration(s) every visit Provide education on ulcer and skin care Treatment Activities: Skin care regimen initiated : 07/27/2020 Topical wound management initiated : 07/27/2020 Notes: Electronic Signature(s) Signed: 09/13/2020 5:54:37 PM By: Deon Pilling Entered By: Deon Pilling on 09/13/2020 15:52:53 -------------------------------------------------------------------------------- Pain Assessment Details Patient Name: Date of Service: VIC, ESCO [facility] [name]. 09/13/2020 1:15 PM Medical Record Number: [id number] Patient Account Number: [id number] Date of Birth/Sex: Treating RN: November 29, 1945 (74 y.o. [name] Primary Care Sutton Hirsch: [name] Other Clinician: Referring Jashun Puertas: Treating Estevan Kersh/Extender: [name] in Treatment: 6 Active Problems Location of Pain Severity and Description of Pain Patient Has Paino No Site Locations Pain Management and Medication Current Pain Management: Electronic Signature(s) Signed: 09/14/2020 4:47:23 PM By: Levan Hurst RN, BSN Entered By: Levan Hurst on 09/13/2020 13:40:53 -------------------------------------------------------------------------------- Patient/Caregiver Education Details Patient Name: Date of Service: Julieta Gutting 12/2/2021andnbsp1:15 PM Medical Record Number: [id number] Patient Account Number: [id number] Date of Birth/Gender: Treating RN: 1946/05/08 (74 y.o. [name] Primary Care Physician: [name] Other Clinician: Referring Physician: Treating Physician/Extender: [name] in Treatment: 6 Education Assessment Education Provided To: Patient Education Topics Provided Wound/Skin Impairment: Handouts: Skin Care Do's and Dont's Methods: Explain/Verbal Responses: Reinforcements  needed Electronic Signature(s) Signed: 09/13/2020 5:54:37 PM By: Deon Pilling Entered By: Deon Pilling on 09/13/2020 13:37:45 -------------------------------------------------------------------------------- Wound Assessment Details Patient Name: Date  of Service: LILLIE, BOLLIG [facility] [name]. 09/13/2020 1:15 PM Medical Record Number: [id number] Patient Account Number: [id number] Date of Birth/Sex: Treating RN: Feb 25, 1946 (74 y.o. [name] Primary Care Ranyah Groeneveld: [name] Other Clinician: Referring Danelle Curiale: Treating Sharman Garrott/Extender: [name] in Treatment: 6 Wound Status Wound Number: 1 Primary Abscess Etiology: Wound Location: Left Gluteus Wound Open Wounding Event: Gradually Appeared Status: Date Acquired: 05/30/2019 Comorbid Congestive Heart Failure, Coronary Artery Disease, Weeks Of Treatment: 6 History: Hypertension, Type II Diabetes, Seizure Disorder Clustered Wound: No Wound Measurements Length: (cm) 0.6 Width: (cm) 0.3 Depth: (cm) 0.6 Area: (cm) 0.141 Volume: (cm) 0.085 % Reduction in Area: 97.7% % Reduction in Volume: 99.4% Epithelialization: Medium (34-66%) Tunneling: No Undermining: No Wound Description Classification: Full Thickness Without Exposed Support Structures Wound Margin: Epibole Exudate Amount: Medium Exudate Type: Serosanguineous Exudate Color: red, brown Foul Odor After Cleansing: No Slough/Fibrino Yes Wound Bed Granulation Amount: Large (67-100%) Exposed Structure Granulation Quality: Pink Fascia Exposed: No Necrotic Amount: Small (1-33%) Fat Layer (Subcutaneous Tissue) Exposed: Yes Necrotic Quality: Adherent Slough Tendon Exposed: No Muscle Exposed: No Joint Exposed: No Bone Exposed: No Treatment Notes Wound #1 (Gluteus) Wound Laterality: Left Cleanser Wound Cleanser Discharge Instruction: Cleanse the wound with wound cleanser prior to applying a clean dressing using gauze sponges, not tissue or  cotton balls. Peri-Wound Care Topical Primary Dressing Promogran Prisma Matrix, 4.34 (sq in) (silver collagen) Discharge Instruction: Apply silver collagen to wound bed as instructed. BACK WITH SALINE GAUZE. Secondary Dressing Woven Gauze Sponge, Non-Sterile 4x4 in Discharge Instruction: OR BORDERED FOAM. Apply over primary dressing as directed. Secured With Compression Wrap Compression Stockings Environmental education officer) Signed: 09/14/2020 4:47:23 PM By: Levan Hurst RN, BSN Entered By: Levan Hurst on 09/13/2020 13:41:54 -------------------------------------------------------------------------------- Wound Assessment Details Patient Name: Date of Service: [name]. 09/13/2020 1:15 PM Medical Record Number: [id number] Patient Account Number: [id number] Date of Birth/Sex: Treating RN: 1946-07-03 (74 y.o. [name] Primary Care Corinda Ammon: [name] Other Clinician: Referring Thalia Turkington: Treating Sunday Klos/Extender: [name] in Treatment: 6 Wound Status Wound Number: 2 Primary Diabetic Wound/Ulcer of the Lower Extremity Etiology: Wound Location: Left, Lateral [name] Fourth oe Wound Open Wounding Event: Gradually Appeared Status: Date Acquired: 08/16/2020 Comorbid Congestive Heart Failure, Coronary Artery Disease, Weeks Of Treatment: 4 History: Hypertension, Type II Diabetes, Seizure Disorder Clustered Wound: No Wound Measurements Length: (cm) 0.4 Width: (cm) 0.6 Depth: (cm) 0.1 Area: (cm) 0.188 Volume: (cm) 0.019 % Reduction in Area: -49.2% % Reduction in Volume: -46.2% Epithelialization: None Tunneling: No Undermining: No Wound Description Classification: Unable to visualize wound bed Wound Margin: Flat and Intact Exudate Amount: Small Exudate Type: Serous Exudate Color: amber Foul Odor After Cleansing: No Slough/Fibrino Yes Wound Bed Granulation Amount: None Present (0%) Exposed Structure Necrotic  Amount: Large (67-100%) Fascia Exposed: No Necrotic Quality: Adherent Slough Fat Layer (Subcutaneous Tissue) Exposed: No Tendon Exposed: No Muscle Exposed: No Joint Exposed: No Bone Exposed: No Treatment Notes Wound #2 (Toe Fourth) Wound Laterality: Left, Lateral Cleanser Peri-Wound Care Topical Primary Dressing Iodosorb Gel 10 (gm) Tube Discharge Instruction: Apply to wound bed as instructed Secondary Dressing Woven Gauze Sponges 2x2 in Discharge Instruction: Apply over primary dressing as directed. Secured With Conforming Stretch Gauze Bandage, Sterile 2x75 (in/in) Discharge Instruction: Secure with stretch gauze as directed. Compression Wrap Compression Stockings Add-Ons Electronic Signature(s) Signed: 09/14/2020 4:47:23 PM By: Levan Hurst RN, BSN Entered By: Levan Hurst on 09/13/2020 13:42:14 -------------------------------------------------------------------------------- Wound Assessment Details Patient Name: Date of Service: Luis Pence HN  [name]. 09/13/2020 1:15 PM Medical Record Number: [id number] Patient Account Number: [id number] Date of Birth/Sex: Treating RN: 1946/01/01 (74 y.o. [name], [name] Primary Care Haywood Meinders: [name] Other Clinician: Referring Tyyonna Soucy: Treating Kalai Baca/Extender: [name] in Treatment: 6 Wound Status Wound Number: 3 Primary Pressure Ulcer Etiology: Wound Location: Right Gluteus Wound Open Wounding Event: Shear/Friction Status: Date Acquired: 09/13/2020 Comorbid Congestive Heart Failure, Coronary Artery Disease, Weeks Of Treatment: 0 History: Hypertension, Type II Diabetes, Seizure Disorder Clustered Wound: No Wound Measurements Length: (cm) 0.2 Width: (cm) 0.2 Depth: (cm) 0.3 Area: (cm) 0.031 Volume: (cm) 0.009 % Reduction in Area: 0% % Reduction in Volume: 0% Epithelialization: Small (1-33%) Tunneling: No Undermining: No Wound Description Classification: Category/Stage  II Wound Margin: Distinct, outline attached Exudate Amount: Small Exudate Type: Serosanguineous Exudate Color: red, brown Foul Odor After Cleansing: No Slough/Fibrino No Wound Bed Granulation Amount: Large (67-100%) Exposed Structure Granulation Quality: Pink Fascia Exposed: No Necrotic Amount: None Present (0%) Fat Layer (Subcutaneous Tissue) Exposed: Yes Tendon Exposed: No Muscle Exposed: No Joint Exposed: No Bone Exposed: No Treatment Notes Wound #3 (Gluteus) Wound Laterality: Right Cleanser Wound Cleanser Discharge Instruction: Cleanse the wound with wound cleanser prior to applying a clean dressing using gauze sponges, not tissue or cotton balls. Peri-Wound Care Topical Primary Dressing Promogran Prisma Matrix, 4.34 (sq in) (silver collagen) Discharge Instruction: Apply silver collagen to wound bed as instructed. MOISTEN WITH SALINE OR [place] JELLY. Secondary Dressing Woven Gauze Sponge, Non-Sterile 4x4 in Discharge Instruction: OR BORDERED FOAM. Apply over primary dressing as directed. Secured With Compression Wrap Compression Stockings Environmental education officer) Signed: 09/13/2020 5:54:37 PM By: Deon Pilling Entered By: Deon Pilling on 09/13/2020 14:11:07 -------------------------------------------------------------------------------- Vitals Details Patient Name: Date of Service: [name]. 09/13/2020 1:15 PM Medical Record Number: [id number] Patient Account Number: [id number] Date of Birth/Sex: Treating RN: [birth year] (74 y.o. [name] Primary Care Munir Victorian: [name] Other Clinician: Referring Allegra Cerniglia: Treating Aamirah Salmi/Extender: [name] in Treatment: 6 Vital Signs Time Taken: 13:35 Temperature (F): 97.4 Height (in): 64 Pulse (bpm): 65 Weight (lbs): 150 Respiratory Rate (breaths/min): 16 Body Mass Index (BMI): 25.7 Blood Pressure (mmHg): 105/59 Capillary Blood Glucose (mg/dl): 114 Reference  Range: 80 - 120 mg / dl Notes glucose per pt report Electronic Signature(s) Signed: 09/14/2020 4:47:23 PM By: Levan Hurst RN, BSN Entered By: Levan Hurst on 09/13/2020 13:40:47

## 2020-09-15 ENCOUNTER — Other Ambulatory Visit: Payer: Self-pay | Admitting: Diagnostic Neuroimaging

## 2020-09-19 ENCOUNTER — Other Ambulatory Visit: Payer: Self-pay

## 2020-09-19 ENCOUNTER — Encounter: Payer: Self-pay | Admitting: Physical Medicine & Rehabilitation

## 2020-09-19 ENCOUNTER — Encounter: Payer: Medicare Other | Attending: Physical Medicine & Rehabilitation | Admitting: Physical Medicine & Rehabilitation

## 2020-09-19 VITALS — BP 101/61 | HR 71 | Temp 97.8°F | Wt 145.0 lb

## 2020-09-19 DIAGNOSIS — G20 Parkinson's disease: Secondary | ICD-10-CM

## 2020-09-19 DIAGNOSIS — G8110 Spastic hemiplegia affecting unspecified side: Secondary | ICD-10-CM | POA: Diagnosis not present

## 2020-09-19 NOTE — Patient Instructions (Signed)
TRY TAKING DANTRIUM AT BEDTIME AND THEN JUST AFTER AWAKENING TO SEE IF THIS HELPS WITH EARLY MORNING SPASTICITY. IF NEEDED, YOU COULD CONSIDER A MID DAY DOSE OF DANTRIUM  PLEASE FEEL FREE TO CALL OUR OFFICE WITH ANY PROBLEMS OR QUESTIONS ([phone])                                @                 @@               @@@                        @@@@                      @@@@@         @@@@@@                  @@@@@@@                @@@@@@@@              @@@@@@@@@             @@@@@@@@@@       IIII                  IIII                                                        HAPPY HOLIDAYS!!!!!

## 2020-09-19 NOTE — Progress Notes (Signed)
Subjective:    Patient ID: [name], male    DOB: 1946-01-13, 74 y.o.   MRN: [id number]  HPI   [name] is here in follow up of his spastic left hemiparesis. He was diagnosed with prostate cancer with mets this Summer and is on hormone deprivation therapy. He is followed closely by oncology and urology. His latest PSA was 2 He has ongoing spasticity in the LUE which can wax and wane. He remains on dantrium for tone control. He usually is stiffest when he first wakes up.   He's receiving palliative care/ HHPT and OT to work on his LUE and mobility. He's essentially at a w/c level now. He needs assist with bathing, dressing.   He has a healing sacral wound followed by the wound care clinic which is coming along nicely      Pain Inventory Average Pain 4 Pain Right Now 0 My pain is no pain  LOCATION OF PAIN  No [ain  BOWEL Number of stools per week: 5 Oral laxative use No  Type of laxative none Enema or suppository use Yes  History of colostomy No  Incontinent No   BLADDER Foley In and out cath, frequency n/a Able to self cath No  Bladder incontinence No  Frequent urination No  Leakage with coughing No  Difficulty starting stream No  Incomplete bladder emptying No    Mobility ability to climb steps?  no do you drive?  no use a wheelchair needs help with transfers  Function retired I need assistance with the following:  bathing and toileting  Neuro/Psych weakness tremor trouble walking  Prior Studies Any changes since last visit?  no  Physicians involved in your care Any changes since last visit?  no   Family History  Problem Relation Age of Onset  . Stroke Father   . Heart attack Father   . Stroke Mother   . Diabetes Brother   . Hyperlipidemia Brother   . Hypertension Brother   . Hyperlipidemia Sister   . Hypertension Sister   . Breast cancer Neg Hx   . Colon cancer Neg Hx   . Pancreatic cancer Neg Hx   . Prostate cancer Neg Hx     Social History   Socioeconomic History  . Marital status: Divorced    Spouse name: Not on file  . Number of children: 4  . Years of education: HS  . Highest education level: Not on file  Occupational History    Employer: RETIRED  Tobacco Use  . Smoking status: Former Smoker    Packs/day: 0.25    Years: 5.00    Pack years: 1.25    Types: Cigarettes    Quit date: 11/14/2007    Years since quitting: 12.8  . Smokeless tobacco: Never Used  Vaping Use  . Vaping Use: Never used  Substance and Sexual Activity  . Alcohol use: No  . Drug use: No  . Sexual activity: Not Currently  Other Topics Concern  . Not on file  Social History Narrative   Patient lives at home with his caregiver/ex-wife.   Caffeine- 2 cups of coffee daily   Social Determinants of Health   Financial Resource Strain:   . Difficulty of Paying Living Expenses: Not on file  Food Insecurity:   . Worried About Charity fundraiser in the Last Year: Not on file  . Ran Out of Food in the Last Year: Not on file  Transportation Needs:   . Lack  of Transportation (Medical): Not on file  . Lack of Transportation (Non-Medical): Not on file  Physical Activity:   . Days of Exercise per Week: Not on file  . Minutes of Exercise per Session: Not on file  Stress:   . Feeling of Stress : Not on file  Social Connections:   . Frequency of Communication with Friends and Family: Not on file  . Frequency of Social Gatherings with Friends and Family: Not on file  . Attends Religious Services: Not on file  . Active Member of Clubs or Organizations: Not on file  . Attends Archivist Meetings: Not on file  . Marital Status: Not on file   Past Surgical History:  Procedure Laterality Date  . CARDIAC CATHETERIZATION  11/17/2002   significant left main disease and 3 vessel CAD, mildly depressed LV systolic  fx, 40% left renal artery stenosis  . CAROTID ENDARTERECTOMY  12/1997   [facility])   Bilateral ICA  . CAROTID STENT  INSERTION Bilateral dr berry and dr Kristine Royal--  (In-stent Restenosis)  LeftICA   02-12-2011;  [place]   02-26-2011  . CORONARY ARTERY BYPASS GRAFT  2/ 2004   dr hendrickson   x3 LIMA to LAD,free right internal mammary artery to obtuse marginal 1,SVG to distal right coronary.  . CYSTOSCOPY W/ RETROGRADES Bilateral 09/10/2015   Procedure: CYSTOSCOPY WITH RETROGRADE PYELOGRAM;  Surgeon: Carolan Clines, MD;  Location: [facility];  Service: Urology;  Laterality: Bilateral;  . CYSTOSCOPY WITH BIOPSY N/A 09/10/2015   Procedure: CYSTOSCOPY WITH BIOPSY,RIGHT TRIGONAL TUMOR 1.5 CM, EXCISIONAL BIOPSY WITH TAUBER FORCEP RIGHT POSTERIOR BLADDER WALL 1 CM, EXCISIONAL BIOPSY SATELITE BLADDER WALL 1 CM;  Surgeon: Carolan Clines, MD;  Location[facility];  Service: Urology;  Laterality: N/A;  . CYSTOSCOPY WITH BIOPSY N/A 03/26/2017   Procedure: CYSTOSCOPY WITH BIOPSY/;  Surgeon: Carolan Clines, MD;  Location: [facility];  Service: Urology;  Laterality: N/A;  . CYSTOSCOPY WITH HYDRODISTENSION AND BIOPSY N/A 10/29/2015   Procedure: REPEAT CYSTOSCOPY BIOPSY BLADDER DEEP MUSCLES BLADDER BIOPSY;  Surgeon: Carolan Clines, MD;  Location: [place];  Service: Urology;  Laterality: N/A;  . CYSTOSCOPY WITH URETHRAL DILATATION N/A 09/10/2015   Procedure: CYSTOSCOPY WITH URETHRAL DILATATION;  Surgeon: Carolan Clines, MD;  Location: [place];  Service: Urology;  Laterality: N/A;  . FULGURATION OF BLADDER TUMOR N/A 03/26/2017   Procedure: FULGURATION OF BLADDER TUMOR;  Surgeon: Carolan Clines, MD;  Location: [facility];  Service: Urology;  Laterality: N/A;  . [place]  . MOHS SURGERY  06-20-2015   side of nose  . NM MYOCAR PERF WALL MOTION  01/09/2011   dr berry   mild to mod. perfusion defect in the basal inferolateral & mid inferolaterl regions consistant with an infarct/scar, no signigicant ischemia  demonstracted/  Low Risk scan, no sig. change from previous study/  normal LV function and wall motion , ef 63%  . ORIF RIGHT ANKLE FX  1998   hardware retained  . TRANSURETHRAL RESECTION OF BLADDER TUMOR N/A 09/10/2015   Procedure: CYSTO TRANSURETHRAL RESECTION OF BLADDER TUMOR (TURBT) OF LEFT POSTERIOR BLADDER TUMOR 2 CM;  Surgeon: Carolan Clines, MD;  Location: [facility];  Service: Urology;  Laterality: N/A;  . TRANSURETHRAL RESECTION OF BLADDER TUMOR Left 04/17/2016   Procedure: TRANSURETHRAL RESECTION OF BLADDER TUMOR (TURBT);  Surgeon: Carolan Clines, MD;  Location: [facility];  Service: Urology;  Laterality: Left;  . US ECHOCARDIOGRAPHY  01/03/2011   normal LVF, ef>55%/  mild LAE/ trace Luis and TR,  mild AV sclerosis without stenosis   Past Medical History:  Diagnosis Date  . At high risk for falls   . Bladder cancer [facility])    urologist-  dr Gaynelle Arabian  . CAD (coronary artery disease) cardiologist-  dr berry  . Cerebrovascular arteriosclerosis   . Gait disturbance, post-stroke    uses cane, rollator, and w/c long distance  . Hemiparkinsonism [facility]) neurologist-- dr Leta Baptist   right side body  . History of basal cell carcinoma excision    Sept 2016--  MOH's surgery side of nose  . History of carotid artery stenosis cardiologist-- dr berry   bilateral --  1999 s/p  bilateral ICA endarterectomy and recurrent restenosis 2012  s/p  staged bilateral stenting   per last duplex 2015  stents widely patent  . History of CVA with residual deficit neurologist-  dr Leta Baptist   3/ 1999  Right MCA  and  12/ 2004  Anterior division of  Right MCA ---  residual left spactic hemiparesis and left foot drop  . History of MI (myocardial infarction)    02/ 2004   s/p  cabg   . HOH (hard of hearing)    LEFT EAR  POST cva  . Hyperextension deformity of left knee    wears brace  . Hyperlipidemia   . Hypertension   . Left foot drop    residual from CVA  -- wears brace  . Left spastic  hemiparesis ([place])    residual CVA 1999  . Pancreatic pseudocyst   . Peripheral arterial disease ([place])   . Prostate cancer ([place])   . S/P CABG x 4    02/ 2004  . Seizure disorder [facility]) last seizure 2011 due to confusion   neurologist-  dr Leta Baptist-- per note seizure documented 2008 breakthrough partial complex seizure (prior tonic-clonic seizure's post CVA)  . Type 2 diabetes, diet controlled ([place])     2  . Visual neglect    LEFT EYE   BP 101/61   Pulse 71   Temp 97.8 F (36.6 C)   SpO2 97%   Opioid Risk Score:   Fall Risk Score:  `1  Depression screen PHQ 2/9  No flowsheet data found.  Review of Systems  Constitutional: Negative.   Eyes: Negative.   Respiratory: Negative.   Cardiovascular: Negative.   Gastrointestinal: Negative.   Endocrine: Negative.   Genitourinary: Negative.   Musculoskeletal: Positive for gait problem.  Skin: Negative.   Neurological: Positive for tremors and weakness.  Hematological: Negative.   Psychiatric/Behavioral: Negative.   All other systems reviewed and are negative.      Objective:   Physical Exam   General: No acute distress HEENT: EOMI, oral membranes moist Cards: reg rate  Chest: normal effort Abdomen: Soft, NT, ND Skin: dry, intact, sacrum not visualized Extremities: no edema  Neuro: spastic left hemiparesis. Left shoulder tight in all planes. Left elbow can be almost fully extended. Left wrist and fingers 1/4. Some subluxation of wrist flexor tendons.  Musculoskeletal: left elbow contracture mild.  Psych: pleasant, normal affect    ASSESSMENT:  1. Right cerebrovascular accident, spastic left hemiparesis.  2. Seizure disorder.  3. Parkinson's disease 4. Bladder cancer     PLAN:  1. Continue HEP, HH therapies for ROM, functional mobility  2. Continue with Dilantin and keppra per neuro recs.   3. Recommended dantrium at bedtime and first thing in the morning  to help with increased AM tone. If tone becomes a problem  mid day we can add a mid day dose.. 4. BladderProstate cancer mgt per urology/oncology  5. Buttock wound per wound care clinic  -silver alginate    Fifteen minutes of face to face patient care time were spent during this visit. All questions were encouraged and answered.  Follow up with me in 6 mos .

## 2020-09-27 ENCOUNTER — Other Ambulatory Visit (HOSPITAL_COMMUNITY)
Admission: RE | Admit: 2020-09-27 | Discharge: 2020-09-27 | Disposition: A | Discharge status: Home / Self Care | Payer: Medicare Other | Source: Other Acute Inpatient Hospital | Attending: Internal Medicine | Admitting: Internal Medicine

## 2020-09-27 ENCOUNTER — Other Ambulatory Visit: Payer: Self-pay

## 2020-09-27 ENCOUNTER — Encounter (HOSPITAL_BASED_OUTPATIENT_CLINIC_OR_DEPARTMENT_OTHER): Payer: Medicare Other | Admitting: Internal Medicine

## 2020-09-27 DIAGNOSIS — L89312 Pressure ulcer of right buttock, stage 2: Secondary | ICD-10-CM | POA: Diagnosis present

## 2020-09-27 DIAGNOSIS — E11621 Type 2 diabetes mellitus with foot ulcer: Secondary | ICD-10-CM | POA: Diagnosis not present

## 2020-09-28 NOTE — Progress Notes (Signed)
[name], [name] ([id number]) . Visit Report for 09/27/2020 Arrival Information Details Patient Name: Date of Service: [name], [name] 09/27/2020 9:00 A M Medical Record Number: [id number] Patient Account Number: [id number] Date of Birth/Sex: Treating RN: [birth year] (74 y.o. [name], [name] Primary Care Provider: Jonathon [name] Other Clinician: Referring Provider: Treating Provider/Extender: [name] in Treatment: 8 Visit Information History Since Last Visit Added or deleted any medications: No Patient Arrived: Wheel Chair Any new allergies or adverse reactions: No Arrival Time: 09:23 Had a fall or experienced change in No Accompanied By: wife activities of daily living that may affect Transfer Assistance: None risk of falls: Patient Identification Verified: Yes Signs or symptoms of abuse/neglect since last visito No Secondary Verification Process Completed: Yes Hospitalized since last visit: No Patient Requires Transmission-Based Precautions: No Implantable device outside of the clinic excluding No Patient Has Alerts: Yes cellular tissue based products placed in the center Patient Alerts: Patient on Blood Thinner since last visit: Has Dressing in Place as Prescribed: Yes Pain Present Now: No Electronic Signature(s) Signed: 09/28/2020 11:46:37 AM By: Sandre Kitty Entered By: Sandre Kitty on 09/27/2020 09:23:31 -------------------------------------------------------------------------------- Encounter Discharge Information Details Patient Name: Date of Service: [name] HN T. 09/27/2020 9:00 A M Medical Record Number: [id number] Patient Account Number: [id number] Date of Birth/Sex: Treating RN: Aug 02, 1946 (74 y.o. [name], [name] Primary Care Provider: Jonathon [name] Other Clinician: Referring Provider: Treating Provider/Extender: [name] in Treatment: 8 Encounter Discharge Information Items  Post Procedure Vitals Discharge Condition: Stable Temperature (F): 98.9 Ambulatory Status: Wheelchair Pulse (bpm): 61 Discharge Destination: Home Respiratory Rate (breaths/min): 17 Transportation: Private Auto Blood Pressure (mmHg): 135/77 Accompanied By: CG Schedule Follow-up Appointment: Yes Clinical Summary of Care: Patient Declined Electronic Signature(s) Signed: 09/27/2020 5:06:25 PM By: Rhae Hammock RN Entered By: Rhae Hammock on 09/27/2020 10:41:13 -------------------------------------------------------------------------------- Multi Wound Chart Details Patient Name: Date of Service: [name] HN T. 09/27/2020 9:00 A M Medical Record Number: [id number] Patient Account Number: [id number] Date of Birth/Sex: Treating RN: 1946/07/06 (74 y.o. [name], [name] Primary Care Provider: Jonathon [name] Other Clinician: Referring Provider: Treating Provider/Extender: [name] in Treatment: 8 Vital Signs Height(in): 64 Capillary Blood Glucose(mg/dl): 132 Weight(lbs): 150 Pulse(bpm): 76 Body Mass Index(BMI): 26 Blood Pressure(mmHg): 135/77 Temperature(F): 98.09 Respiratory Rate(breaths/min): 16 Photos: [1:No Photos Left Gluteus] [2:No Photos Left, Lateral T Fourth oe] [3:No Photos Right Gluteus] Wound Location: [1:Gradually Appeared] [2:Gradually Appeared] [3:Shear/Friction] Wounding Event: [1:Abscess] [2:Diabetic Wound/Ulcer of the Lower] [3:Pressure Ulcer] Primary Etiology: [1:Congestive Heart Failure, Coronary Congestive Heart Failure, Coronary] [2:Extremity] [3:Congestive Heart Failure, Coronary] Comorbid History: [1:Artery Disease, Hypertension, Type II Artery Disease, Hypertension, Type II Diabetes, Seizure Disorder 05/30/2019] [2:Diabetes, Seizure Disorder 08/16/2020] [3:Artery Disease, Hypertension, Type II Diabetes, Seizure Disorder 09/13/2020] Date Acquired: [1:8] [2:6] [3:2] Weeks of Treatment: [1:Open] [2:Open] [3:Open] Wound  Status: [1:0.4x0.4x0.1] [2:0.4x0.4x0.1] [3:0.2x0.2x0.2] Measurements L x W x D (cm) [1:0.126] [2:0.126] [3:0.031] A (cm) : rea [1:0.013] [2:0.013] [3:0.006] Volume (cm) : [1:97.90%] [2:0.00%] [3:0.00%] % Reduction in Area: [1:99.90%] [2:0.00%] [3:33.30%] % Reduction in Volume: [1:Full Thickness Without Exposed] [2:Grade 2] [3:Category/Stage II] Classification: [1:Support Structures Medium] [2:Small] [3:Small] Exudate Amount: [1:Serosanguineous] [2:Serous] [3:Serosanguineous] Exudate Type: [1:red, brown] [2:amber] [3:red, brown] Exudate Color: [1:Epibole] [2:Flat and Intact] [3:Distinct, outline attached] Wound Margin: [1:Large (67-100%)] [2:None Present (0%)] [3:Large (67-100%)] Granulation Amount: [1:Pink] [2:N/A] [3:Pink] Granulation Quality: [1:Small (1-33%)] [2:Large (67-100%)] [3:None Present (0%)] Necrotic Amount: [1:Fat Layer (Subcutaneous Tissue): Yes Fascia: No] [3:Fat Layer (Subcutaneous Tissue): Yes] Exposed Structures: [1:Fascia:  No Tendon: No Muscle: No Joint: No Bone: No Medium (34-66%)] [2:Fat Layer (Subcutaneous Tissue): No Tendon: No Muscle: No Joint: No Bone: No None] [3:Fascia: No Tendon: No Muscle: No Joint: No Bone: No Small (1-33%)] Treatment Notes Electronic Signature(s) Signed: 09/27/2020 5:23:27 PM By: Deon Pilling Signed: 09/28/2020 8:01:39 AM By: Linton Ham MD Entered By: Linton Ham on 09/27/2020 10:31:29 -------------------------------------------------------------------------------- Multi-Disciplinary Care Plan Details Patient Name: Date of Service: [name] HN T. 09/27/2020 9:00 A M Medical Record Number: [id number] Patient Account Number: [id number] Date of Birth/Sex: Treating RN: 1946-04-09 (74 y.o. [name] Primary Care Provider: Jonathon [name] Other Clinician: Referring Provider: Treating Provider/Extender: [name] in Treatment: 8 Active Inactive Wound/Skin Impairment Nursing  Diagnoses: Impaired tissue integrity Knowledge deficit related to ulceration/compromised skin integrity Goals: Patient/caregiver will verbalize understanding of skin care regimen Date Initiated: 07/27/2020 Target Resolution Date: 11/09/2020 Goal Status: Active Ulcer/skin breakdown will have a volume reduction of 30% by week 4 Date Initiated: 07/27/2020 Date Inactivated: 09/13/2020 Target Resolution Date: 08/24/2020 Goal Status: Met Interventions: Assess patient/caregiver ability to obtain necessary supplies Assess patient/caregiver ability to perform ulcer/skin care regimen upon admission and as needed Assess ulceration(s) every visit Provide education on ulcer and skin care Treatment Activities: Skin care regimen initiated : 07/27/2020 Topical wound management initiated : 07/27/2020 Notes: Electronic Signature(s) Signed: 09/27/2020 5:23:27 PM By: Deon Pilling Entered By: Deon Pilling on 09/27/2020 09:04:24 -------------------------------------------------------------------------------- Pain Assessment Details Patient Name: Date of Service: [name] HN T. 09/27/2020 9:00 A M Medical Record Number: [id number] Patient Account Number: [id number] Date of Birth/Sex: Treating RN: Dec 16, 1945 (74 y.o. [name] Primary Care Provider: Jonathon [name] Other Clinician: Referring Provider: Treating Provider/Extender: [name] in Treatment: 8 Active Problems Location of Pain Severity and Description of Pain Patient Has Paino No Site Locations Pain Management and Medication Current Pain Management: Electronic Signature(s) Signed: 09/27/2020 5:23:27 PM By: Deon Pilling Signed: 09/28/2020 11:46:37 AM By: Sandre Kitty Entered By: Sandre Kitty on 09/27/2020 09:24:04 -------------------------------------------------------------------------------- Patient/Caregiver Education Details Patient Name: Date of Service: [name] T.  12/16/2021andnbsp9:00 Milo Record Number: [id number] Patient Account Number: [id number] Date of Birth/Gender: Treating RN: [birth year] (74 y.o. [name] Primary Care Physician: [name] Other Clinician: Referring Physician: Treating Physician/Extender: [name] in Treatment: 8 Education Assessment Education Provided To: Patient Education Topics Provided Wound/Skin Impairment: Handouts: Skin Care Do's and Dont's Methods: Explain/Verbal Responses: Reinforcements needed Electronic Signature(s) Signed: 09/27/2020 5:23:27 PM By: Deon Pilling Entered By: Deon Pilling on 09/27/2020 09:04:34 -------------------------------------------------------------------------------- Wound Assessment Details Patient Name: Date of Service: [name] HN T. 09/27/2020 9:00 A M Medical Record Number: [id number] Patient Account Number: [id number] Date of Birth/Sex: Treating RN: 16-Jul-1946 (74 y.o. [name], [name] Primary Care Provider: Jonathon [name] Other Clinician: Referring Provider: Treating Provider/Extender: [name] in Treatment: 8 Wound Status Wound Number: 1 Primary Abscess Etiology: Wound Location: Left Gluteus Wound Open Wounding Event: Gradually Appeared Status: Date Acquired: 05/30/2019 Comorbid Congestive Heart Failure, Coronary Artery Disease, Weeks Of Treatment: 8 History: Hypertension, Type II Diabetes, Seizure Disorder Clustered Wound: No Wound Measurements Length: (cm) 0.4 Width: (cm) 0.4 Depth: (cm) 0.1 Area: (cm) 0.126 Volume: (cm) 0.013 % Reduction in Area: 97.9% % Reduction in Volume: 99.9% Epithelialization: Medium (34-66%) Wound Description Classification: Full Thickness Without Exposed Support Structures Wound Margin: Epibole Exudate Amount: Medium Exudate Type: Serosanguineous Exudate Color: red, brown Foul Odor After Cleansing: No Slough/Fibrino Yes Wound  Bed Granulation Amount: Large (  67-100%) Exposed Structure Granulation Quality: Pink Fascia Exposed: No Necrotic Amount: Small (1-33%) Fat Layer (Subcutaneous Tissue) Exposed: Yes Necrotic Quality: Adherent Slough Tendon Exposed: No Muscle Exposed: No Joint Exposed: No Bone Exposed: No Treatment Notes Wound #1 (Gluteus) Wound Laterality: Left Cleanser Wound Cleanser Discharge Instruction: Cleanse the wound with wound cleanser prior to applying a clean dressing using gauze sponges, not tissue or cotton balls. Peri-Wound Care Topical Primary Dressing Promogran Prisma Matrix, 4.34 (sq in) (silver collagen) Discharge Instruction: Apply silver collagen to wound bed as instructed. BACK WITH SALINE GAUZE. Secondary Dressing Woven Gauze Sponge, Non-Sterile 4x4 in Discharge Instruction: OR BORDERED FOAM. Apply over primary dressing as directed. Secured With Compression Wrap Compression Stockings Environmental education officer) Signed: 09/27/2020 5:06:25 PM By: Rhae Hammock RN Signed: 09/27/2020 5:23:27 PM By: Deon Pilling Entered By: Rhae Hammock on 09/27/2020 09:46:03 -------------------------------------------------------------------------------- Wound Assessment Details Patient Name: Date of Service: [name] HN T. 09/27/2020 9:00 A M Medical Record Number: [id number] Patient Account Number: [id number] Date of Birth/Sex: Treating RN: June 26, 1946 (74 y.o. [name], [name] Primary Care Mykeisha Dysert: [name] Other Clinician: Referring Kalicia Dufresne: Treating Koty Anctil/Extender: [name] in Treatment: 8 Wound Status Wound Number: 2 Primary Diabetic Wound/Ulcer of the Lower Extremity Etiology: Wound Location: Left, Lateral T Fourth oe Wound Open Wounding Event: Gradually Appeared Status: Date Acquired: 08/16/2020 Comorbid Congestive Heart Failure, Coronary Artery Disease, Weeks Of Treatment: 6 History: Hypertension, Type II Diabetes,  Seizure Disorder Clustered Wound: No Wound Measurements Length: (cm) 0.4 Width: (cm) 0.4 Depth: (cm) 0.1 Area: (cm) 0.126 Volume: (cm) 0.013 % Reduction in Area: 0% % Reduction in Volume: 0% Epithelialization: None Tunneling: No Undermining: No Wound Description Classification: Grade 2 Wound Margin: Flat and Intact Exudate Amount: Small Exudate Type: Serous Exudate Color: amber Foul Odor After Cleansing: No Slough/Fibrino Yes Wound Bed Granulation Amount: None Present (0%) Exposed Structure Necrotic Amount: Large (67-100%) Fascia Exposed: No Necrotic Quality: Adherent Slough Fat Layer (Subcutaneous Tissue) Exposed: No Tendon Exposed: No Muscle Exposed: No Joint Exposed: No Bone Exposed: No Treatment Notes Wound #2 (Toe Fourth) Wound Laterality: Left, Lateral Cleanser Wound Cleanser Discharge Instruction: Cleanse the wound with wound cleanser prior to applying a clean dressing using gauze sponges, not tissue or cotton balls. Peri-Wound Care Topical Primary Dressing Iodosorb Gel 10 (gm) Tube Discharge Instruction: Apply to wound bed as instructed Secondary Dressing Woven Gauze Sponges 2x2 in Discharge Instruction: Apply over primary dressing as directed. Secured With Conforming Stretch Gauze Bandage, Sterile 2x75 (in/in) Discharge Instruction: Secure with stretch gauze as directed. Compression Wrap Compression Stockings Add-Ons Electronic Signature(s) Signed: 09/27/2020 5:06:25 PM By: Rhae Hammock RN Signed: 09/27/2020 5:23:27 PM By: Deon Pilling Entered By: Rhae Hammock on 09/27/2020 09:46:41 -------------------------------------------------------------------------------- Wound Assessment Details Patient Name: Date of Service: [name] HN T. 09/27/2020 9:00 A M Medical Record Number: [id number] Patient Account Number: [id number] Date of Birth/Sex: Treating RN: December 07, 1945 (74 y.o. [name], [name] Primary Care Latysha Thackston: [name] Other Clinician: Referring Dashaun Onstott: Treating Saraya Tirey/Extender: [name] in Treatment: 8 Wound Status Wound Number: 3 Primary Pressure Ulcer Etiology: Wound Location: Right Gluteus Wound Open Wounding Event: Shear/Friction Status: Date Acquired: 09/13/2020 Comorbid Congestive Heart Failure, Coronary Artery Disease, Weeks Of Treatment: 2 History: Hypertension, Type II Diabetes, Seizure Disorder Clustered Wound: No Wound Measurements Length: (cm) 0.2 Width: (cm) 0.2 Depth: (cm) 0.2 Area: (cm) 0.031 Volume: (cm) 0.006 % Reduction in Area: 0% % Reduction in Volume: 33.3% Epithelialization: Small (1-33%) Tunneling: No Undermining: No Wound Description Classification: Category/Stage  II Wound Margin: Distinct, outline attached Exudate Amount: Small Exudate Type: Serosanguineous Exudate Color: red, brown Foul Odor After Cleansing: No Slough/Fibrino No Wound Bed Granulation Amount: Large (67-100%) Exposed Structure Granulation Quality: Pink Fascia Exposed: No Necrotic Amount: None Present (0%) Fat Layer (Subcutaneous Tissue) Exposed: Yes Tendon Exposed: No Muscle Exposed: No Joint Exposed: No Bone Exposed: No Treatment Notes Wound #3 (Gluteus) Wound Laterality: Right Cleanser Wound Cleanser Discharge Instruction: Cleanse the wound with wound cleanser prior to applying a clean dressing using gauze sponges, not tissue or cotton balls. Peri-Wound Care Topical triple antibiotic ointment Discharge Instruction: apply to wound area. Primary Dressing triple abx Secondary Dressing Woven Gauze Sponge, Non-Sterile 4x4 in Discharge Instruction: OR BORDERED FOAM. Apply over primary dressing as directed. Secured With Compression Wrap Compression Stockings Environmental education officer) Signed: 09/27/2020 5:06:25 PM By: Rhae Hammock RN Signed: 09/27/2020 5:23:27 PM By: Deon Pilling Entered By: Rhae Hammock on 09/27/2020  09:48:49 -------------------------------------------------------------------------------- Vitals Details Patient Name: Date of Service: [name] HN T. 09/27/2020 9:00 A M Medical Record Number: [id number] Patient Account Number: [id number] Date of Birth/Sex: Treating RN: [birth year] (74 y.o. [name] Primary Care Kentrell Hallahan: [name] Other Clinician: Referring Nneka Blanda: Treating Kaydra Borgen/Extender: [name] in Treatment: 8 Vital Signs Time Taken: 09:23 Temperature (F): 98.09 Height (in): 64 Pulse (bpm): 61 Weight (lbs): 150 Respiratory Rate (breaths/min): 16 Body Mass Index (BMI): 25.7 Blood Pressure (mmHg): 135/77 Capillary Blood Glucose (mg/dl): 132 Reference Range: 80 - 120 mg / dl Electronic Signature(s) Signed: 09/28/2020 11:46:37 AM By: Sandre Kitty Entered By: Sandre Kitty on 09/27/2020 09:23:56

## 2020-09-28 NOTE — Progress Notes (Signed)
[name], [name] ([id number]) . Visit Report for 09/27/2020 Debridement Details Patient Name: Date of Service: [name], [name] [facility] [name]. 09/27/2020 9:00 A M Medical Record Number: [id number] Patient Account Number: [id number] Date of Birth/Sex: Treating RN: Oct 19, 1945 (74 y.o. [name], [name] Primary Care Provider: [name] Other Clinician: Referring Provider: Treating Provider/Extender: [name] in Treatment: 8 Debridement Performed for Assessment: Wound #2 Left,Lateral [name] Fourth oe Performed By: Physician Ricard Dillon., MD Debridement Type: Debridement Severity of Tissue Pre Debridement: Fat layer exposed Level of Consciousness (Pre-procedure): Awake and Alert Pre-procedure Verification/Time Out Yes - 10:12 Taken: Start Time: 10:13 Pain Control: Lidocaine 4% [name] opical Solution [name] Area Debrided (L x W): otal 0.4 (cm) x 0.4 (cm) = 0.16 (cm) Tissue and other material debrided: Viable, Non-Viable, Slough, Subcutaneous, Skin: Dermis , Skin: Epidermis, Slough Level: Skin/Subcutaneous Tissue Debridement Description: Excisional Instrument: Curette Bleeding: Minimum Hemostasis Achieved: Pressure End Time: 10:16 Procedural Pain: 0 Post Procedural Pain: 0 Response to Treatment: Procedure was tolerated well Level of Consciousness (Post- Awake and Alert procedure): Post Debridement Measurements of Total Wound Length: (cm) 0.4 Width: (cm) 0.4 Depth: (cm) 0.1 Volume: (cm) 0.013 Character of Wound/Ulcer Post Debridement: Improved Severity of Tissue Post Debridement: Fat layer exposed Post Procedure Diagnosis Same as Pre-procedure Electronic Signature(s) Signed: 09/27/2020 5:23:27 PM By: Deon Pilling Signed: 09/28/2020 8:01:39 AM By: Linton Ham MD Entered By: Deon Pilling on 09/27/2020 10:34:01 -------------------------------------------------------------------------------- Debridement Details Patient Name: Date of Service: [name] HN  [name]. 09/27/2020 9:00 A M Medical Record Number: [id number] Patient Account Number: [id number] Date of Birth/Sex: Treating RN: 1946/04/18 (74 y.o. [name] Primary Care Provider: [name] Other Clinician: Referring Provider: Treating Provider/Extender: [name] in Treatment: 8 Debridement Performed for Assessment: Wound #1 Left Gluteus Performed By: Physician Ricard Dillon., MD Debridement Type: Debridement Level of Consciousness (Pre-procedure): Awake and Alert Pre-procedure Verification/Time Out Yes - 10:12 Taken: Start Time: 10:13 Pain Control: Lidocaine 4% [name] opical Solution [name] Area Debrided (L x W): otal 0.4 (cm) x 0.4 (cm) = 0.16 (cm) Tissue and other material debrided: Viable, Non-Viable, Slough, Subcutaneous, Skin: Dermis , Skin: Epidermis, Slough Level: Skin/Subcutaneous Tissue Debridement Description: Excisional Instrument: Curette Bleeding: Minimum Hemostasis Achieved: Pressure End Time: 10:16 Procedural Pain: 0 Post Procedural Pain: 0 Response to Treatment: Procedure was tolerated well Level of Consciousness (Post- Awake and Alert procedure): Post Debridement Measurements of Total Wound Length: (cm) 0.4 Width: (cm) 0.4 Depth: (cm) 0.1 Volume: (cm) 0.013 Character of Wound/Ulcer Post Debridement: Improved Post Procedure Diagnosis Same as Pre-procedure Electronic Signature(s) Signed: 09/27/2020 5:23:27 PM By: Deon Pilling Signed: 09/28/2020 8:01:39 AM By: Linton Ham MD Entered By: Deon Pilling on 09/27/2020 10:34:26 -------------------------------------------------------------------------------- HPI Details Patient Name: Date of Service: [name] HN [name]. 09/27/2020 9:00 A M Medical Record Number: [id number] Patient Account Number: [id number] Date of Birth/Sex: Treating RN: December 11, 1945 (74 y.o. [name] Primary Care Provider: [name] Other Clinician: Referring Provider: Treating  Provider/Extender: [name] in Treatment: 8 History of Present Illness HPI Description: ADMISSION 07/27/2020 This is a 74 year old man who is accompanied by his wife (ex). Nevertheless she is his active caregiver. His most complicated features are remote CVA that left him with a left hemiparesis. He also has widely metastatic prostate cancer to bone which I think is contributed to increasing frailty this year and he is no longer ambulatory. He required hospitalization from 9/7 through 9/14 with respiratory failure seizures. During this time it was noted  that he had an open area in the left buttock in close proximity to the gluteal cleft. His wife tells me that he has a history of perirectal abscesses. And she describes this area as starting as a painful swelling in late August certainly sounds like an abscess. When he is in the hospital he required bedside IandD's but did not go to the OR. Has CT scan of the area showed soft tissue thickening in the perianal ulcer. At that point it was concerning for a fistula connection however a discrete abscess was not seen. Notable that he was hypoalbuminemic in the hospital but actually came out better with an albumin of 3.1 on 9/14 his wife says he is eating well. They are currently treating this with Santyl ointment and a backing wet-to-dry dressing. By description of his wife this is done quite nicely and there is certainly less debris over the wound surface. They already have a hospital bed with a level 3 pressure relief surface. The patient lies on his back when he is in bed especially at night although his wife is counseled him not to do this. They are starting sliding board transfers into his wheelchair I think with physical therapy. He has a Foley catheter in place Past medical history includes remote CVA, metastatic prostate cancer to large areas of his thoracic and lumbar spine and pelvis., Bilateral carotid  artery stenosis, Parkinson's disease, type 2 diabetes, diastolic heart failure, CHF, hypertension and a recently discovered possible pancreatic mass in the head of the pancreas 08/16/2020 patient I admitted to the clinic 2-1/2 weeks ago. He had an abscess site on his left buttock that required an IandD while he was in the hospital in September. He has been using Santyl backing wet-to-dry. He is cared for her aerobically at home by his wife 2 concerning areas on his feet. A stage I pressure injury on the right heel this does not have an open wound. He also has on the lateral aspect of the left fourth toe a dry circular eschared wound. 12/2; monthly follow-up. Is a very disabled man who had an abscess in his left buttock and required an IandD. We have been using Santyl to this area I changed to silver collagen last time he is here and this actually looks quite a bit better The last time he was here his wife showed me an area on the left lateral fourth toe I think a pressure injury with his fifth toe. Been using Santyl here as well but not making much improvement As well she shows me he has an area on his right buttock which is very tiny pinpoint area but under illumination still obviously an open area. I wonder if this is more of a shear injury and transferring and a pressure injury per se 12/16; this is a very disabled man who has a left buttock area that required an IandD apparently an abscess. Since he has come here that he developed a area on the right buttock. His wife says that this is draining pus. Finally he has an area on the lateral part of the left fourth toe. We have been using silver collagen on the buttock and Iodoflex on the fourth toe Electronic Signature(s) Signed: 09/28/2020 8:01:39 AM By: Linton Ham MD Entered By: Linton Ham on 09/27/2020 10:34:41 -------------------------------------------------------------------------------- Physical Exam Details Patient Name: Date of  Service: [name] HN [name]. 09/27/2020 9:00 A M Medical Record Number: [id number] Patient Account Number: [id number] Date of Birth/Sex: Treating RN: Mar 17, [phone] 74  y.o. [name] Primary Care Provider: [name] Other Clinician: Referring Provider: Treating Provider/Extender: [name] in Treatment: 8 Constitutional Sitting or standing Blood Pressure is within target range for patient.. Pulse regular and within target range for patient.Marland Kitchen Respirations regular, non-labored and within target range.. Temperature is normal and within the target range for the patient.Marland Kitchen Appears in no distress. Notes Wound exam; the area in question is on the left buttock. About the same as last time. Still has some depth required debridement with a #3 curette to remove necrotic surface on this. The area on the buttock is again a pinpoint area no palpable tenderness here. With compression I was able to express some thick cheesy material I wonder if this is a developing sebaceous cyst. This does not look like puss Left lateral fourth toe again necrotic debris on the surface. Requiring an aggressive debridement with a #3 curette hemostasis with direct pressure. This does not bleed very much therefore I do not think this is a plantar wart Electronic Signature(s) Signed: 09/28/2020 8:01:39 AM By: Linton Ham MD Entered By: Linton Ham on 09/27/2020 10:37:25 -------------------------------------------------------------------------------- Physician Orders Details Patient Name: Date of Service: [name] HN [name]. 09/27/2020 9:00 A M Medical Record Number: [id number] Patient Account Number: [id number] Date of Birth/Sex: Treating RN: Mar 17, 1946 (74 y.o. [name] Primary Care Provider: [name] Other Clinician: Referring Provider: Treating Provider/Extender: [name] in Treatment: 8 Verbal / Phone Orders: No Diagnosis  Coding ICD-10 Coding Code Description L02.31 Cutaneous abscess of buttock S31.829D Unspecified open wound of left buttock, subsequent encounter L97.528 Non-pressure chronic ulcer of other part of left foot with other specified severity L89.312 Pressure ulcer of right buttock, stage 2 Follow-up Appointments Return appointment in 3 weeks. - 10/19/2019 Off-Loading Low air-loss mattress (Group 2) - CONTINUE TO USE. Turn and reposition every 2 hours Other: - CONTINUE TO USE BUNNY BOOTS FOR HEEL PROTECTION. UP IN WHEELCHAIR NO MORE THAT 2 HOUR INCREMENTS. Home Health New wound care orders this week; continue Home Health for wound care. May utilize formulary equivalent dressing for wound treatment orders unless otherwise specified. - [facility]. Wound Treatment Wound #1 - Gluteus Wound Laterality: Left Cleanser: Wound Cleanser (Home Health) Every Other Day/30 Days Discharge Instructions: Cleanse the wound with wound cleanser prior to applying a clean dressing using gauze sponges, not tissue or cotton balls. Prim Dressing: Promogran Prisma Matrix, 4.34 (sq in) (silver collagen) (Home Health) Every Other Day/30 Days ary Discharge Instructions: Apply silver collagen to wound bed as instructed. BACK WITH SALINE GAUZE. Secondary Dressing: Woven Gauze Sponge, Non-Sterile 4x4 in [facility]) Every Other Day/30 Days Discharge Instructions: OR BORDERED FOAM. Apply over primary dressing as directed. Wound #2 - [name] Fourth oe Wound Laterality: Left, Lateral Cleanser: Wound Cleanser (Home Health) Every Other Day/30 Days Discharge Instructions: Cleanse the wound with wound cleanser prior to applying a clean dressing using gauze sponges, not tissue or cotton balls. Prim Dressing: Iodosorb Gel 10 (gm) Tube [facility]) Every Other Day/30 Days ary Discharge Instructions: Apply to wound bed as instructed Secondary Dressing: Woven Gauze Sponges 2x2 in [facility]) Every Other  Day/30 Days Discharge Instructions: Apply over primary dressing as directed. Secured With: Child psychotherapist, Sterile 2x75 (in/in) (Home Health) Every Other Day/30 Days Discharge Instructions: Secure with stretch gauze as directed. Wound #3 - Gluteus Wound Laterality: Right Cleanser: Wound Cleanser [facility]) Every Other Day/30 Days Discharge Instructions: Cleanse the wound  with wound cleanser prior to applying a clean dressing using gauze sponges, not tissue or cotton balls. Topical: triple antibiotic ointment [facility]) Every Other Day/30 Days Discharge Instructions: apply to wound area. Prim Dressing: triple abx ary Every Other Day/30 Days Secondary Dressing: Woven Gauze Sponge, Non-Sterile 4x4 in [facility]) Every Other Day/30 Days Discharge Instructions: OR BORDERED FOAM. Apply over primary dressing as directed. Laboratory naerobe culture (MICRO) - culture of right gluteal wound. Bacteria identified in Unspecified specimen by A LOINC Code: 213-0 Convenience Name: Anerobic culture Electronic Signature(s) Signed: 09/27/2020 5:23:27 PM By: Deon Pilling Signed: 09/28/2020 8:01:39 AM By: Linton Ham MD Entered By: Deon Pilling on 09/27/2020 10:20:03 -------------------------------------------------------------------------------- Problem List Details Patient Name: Date of Service: [name] HN [name]. 09/27/2020 9:00 A M Medical Record Number: [id number] Patient Account Number: [id number] Date of Birth/Sex: Treating RN: 02/19/1946 (74 y.o. [name] Primary Care Provider: Other Clinician: [name] Referring Provider: Treating Provider/Extender: [name] in Treatment: 8 Active Problems ICD-10 Encounter Code Description Active Date MDM Diagnosis L02.31 Cutaneous abscess of buttock 07/27/2020 No Yes S31.829D Unspecified open wound of left buttock, subsequent encounter 07/27/2020 No Yes L97.528 Non-pressure  chronic ulcer of other part of left foot with other specified 08/16/2020 No Yes severity L89.312 Pressure ulcer of right buttock, stage 2 09/13/2020 No Yes Inactive Problems ICD-10 Code Description Active Date Inactive Date L89.611 Pressure ulcer of right heel, stage 1 08/16/2020 08/16/2020 Resolved Problems Electronic Signature(s) Signed: 09/28/2020 8:01:39 AM By: Linton Ham MD Entered By: Linton Ham on 09/27/2020 10:31:20 -------------------------------------------------------------------------------- Progress Note Details Patient Name: Date of Service: [name] HN [name]. 09/27/2020 9:00 A M Medical Record Number: [id number] Patient Account Number: [id number] Date of Birth/Sex: Treating RN: [birth year] (74 y.o. [name] Primary Care Provider: [name] Other Clinician: Referring Provider: Treating Provider/Extender: [name] in Treatment: 8 Subjective History of Present Illness (HPI) ADMISSION 07/27/2020 This is a 74 year old man who is accompanied by his wife (ex). Nevertheless she is his active caregiver. His most complicated features are remote CVA that left him with a left hemiparesis. He also has widely metastatic prostate cancer to bone which I think is contributed to increasing frailty this year and he is no longer ambulatory. He required hospitalization from 9/7 through 9/14 with respiratory failure seizures. During this time it was noted that he had an open area in the left buttock in close proximity to the gluteal cleft. His wife tells me that he has a history of perirectal abscesses. And she describes this area as starting as a painful swelling in late August certainly sounds like an abscess. When he is in the hospital he required bedside IandD's but did not go to the OR. Has CT scan of the area showed soft tissue thickening in the perianal ulcer. At that point it was concerning for a fistula connection however a discrete  abscess was not seen. Notable that he was hypoalbuminemic in the hospital but actually came out better with an albumin of 3.1 on 9/14 his wife says he is eating well. They are currently treating this with Santyl ointment and a backing wet-to-dry dressing. By description of his wife this is done quite nicely and there is certainly less debris over the wound surface. They already have a hospital bed with a level 3 pressure relief surface. The patient lies on his back when he is in bed especially at night although his wife is counseled him not to do this. They are  starting sliding board transfers into his wheelchair I think with physical therapy. He has a Foley catheter in place Past medical history includes remote CVA, metastatic prostate cancer to large areas of his thoracic and lumbar spine and pelvis., Bilateral carotid artery stenosis, Parkinson's disease, type 2 diabetes, diastolic heart failure, CHF, hypertension and a recently discovered possible pancreatic mass in the head of the pancreas 08/16/2020 patient I admitted to the clinic 2-1/2 weeks ago. He had an abscess site on his left buttock that required an IandD while he was in the hospital in September. He has been using Santyl backing wet-to-dry. He is cared for her aerobically at home by his wife 2 concerning areas on his feet. A stage I pressure injury on the right heel this does not have an open wound. He also has on the lateral aspect of the left fourth toe a dry circular eschared wound. 12/2; monthly follow-up. Is a very disabled man who had an abscess in his left buttock and required an IandD. We have been using Santyl to this area I changed to silver collagen last time he is here and this actually looks quite a bit better ooThe last time he was here his wife showed me an area on the left lateral fourth toe I think a pressure injury with his fifth toe. Been using Santyl here as well but not making much improvement ooAs well she shows  me he has an area on his right buttock which is very tiny pinpoint area but under illumination still obviously an open area. I wonder if this is more of a shear injury and transferring and a pressure injury per se 12/16; this is a very disabled man who has a left buttock area that required an IandD apparently an abscess. Since he has come here that he developed a area on the right buttock. His wife says that this is draining pus. Finally he has an area on the lateral part of the left fourth toe. We have been using silver collagen on the buttock and Iodoflex on the fourth toe Objective Constitutional Sitting or standing Blood Pressure is within target range for patient.. Pulse regular and within target range for patient.Marland Kitchen Respirations regular, non-labored and within target range.. Temperature is normal and within the target range for the patient.Marland Kitchen Appears in no distress. Vitals Time Taken: 9:23 AM, Height: 64 in, Weight: 150 lbs, BMI: 25.7, Temperature: 98.09 F, Pulse: 61 bpm, Respiratory Rate: 16 breaths/min, Blood Pressure: 135/77 mmHg, Capillary Blood Glucose: 132 mg/dl. General Notes: Wound exam; the area in question is on the left buttock. About the same as last time. Still has some depth required debridement with a #3 curette to remove necrotic surface on this. ooThe area on the buttock is again a pinpoint area no palpable tenderness here. With compression I was able to express some thick cheesy material I wonder if this is a developing sebaceous cyst. This does not look like puss ooLeft lateral fourth toe again necrotic debris on the surface. Requiring an aggressive debridement with a #3 curette hemostasis with direct pressure. This does not bleed very much therefore I do not think this is a plantar wart Integumentary (Hair, Skin) Wound #1 status is Open. Original cause of wound was Gradually Appeared. The wound is located on the Left Gluteus. The wound measures 0.4cm length x 0.4cm width  x 0.1cm depth; 0.126cm^2 area and 0.013cm^3 volume. There is Fat Layer (Subcutaneous Tissue) exposed. There is a medium amount of serosanguineous drainage noted. The  wound margin is epibole. There is large (67-100%) pink granulation within the wound bed. There is a small (1-33%) amount of necrotic tissue within the wound bed including Adherent Slough. Wound #2 status is Open. Original cause of wound was Gradually Appeared. The wound is located on the Left,Lateral [name] Fourth. The wound measures 0.4cm oe length x 0.4cm width x 0.1cm depth; 0.126cm^2 area and 0.013cm^3 volume. There is no tunneling or undermining noted. There is a small amount of serous drainage noted. The wound margin is flat and intact. There is no granulation within the wound bed. There is a large (67-100%) amount of necrotic tissue within the wound bed including Adherent Slough. Wound #3 status is Open. Original cause of wound was Shear/Friction. The wound is located on the Right Gluteus. The wound measures 0.2cm length x 0.2cm width x 0.2cm depth; 0.031cm^2 area and 0.006cm^3 volume. There is Fat Layer (Subcutaneous Tissue) exposed. There is no tunneling or undermining noted. There is a small amount of serosanguineous drainage noted. The wound margin is distinct with the outline attached to the wound base. There is large (67-100%) pink granulation within the wound bed. There is no necrotic tissue within the wound bed. Assessment Active Problems ICD-10 Cutaneous abscess of buttock Unspecified open wound of left buttock, subsequent encounter Non-pressure chronic ulcer of other part of left foot with other specified severity Pressure ulcer of right buttock, stage 2 Procedures Wound #1 Pre-procedure diagnosis of Wound #1 is an Abscess located on the Left Gluteus . There was a Excisional Skin/Subcutaneous Tissue Debridement with a total area of 0.16 sq cm performed by Ricard Dillon., MD. With the following instrument(s): Curette  to remove Viable and Non-Viable tissue/material. Material removed includes Subcutaneous Tissue, Slough, Skin: Dermis, and Skin: Epidermis after achieving pain control using Lidocaine 4% [name] opical Solution. A time out was conducted at 10:12, prior to the start of the procedure. A Minimum amount of bleeding was controlled with Pressure. The procedure was tolerated well with a pain level of 0 throughout and a pain level of 0 following the procedure. Post Debridement Measurements: 0.4cm length x 0.4cm width x 0.1cm depth; 0.013cm^3 volume. Character of Wound/Ulcer Post Debridement is improved. Post procedure Diagnosis Wound #1: Same as Pre-Procedure Wound #2 Pre-procedure diagnosis of Wound #2 is a Diabetic Wound/Ulcer of the Lower Extremity located on the Left,Lateral [name] Fourth .Severity of Tissue Pre oe Debridement is: Fat layer exposed. There was a Excisional Skin/Subcutaneous Tissue Debridement with a total area of 0.16 sq cm performed by Ricard Dillon., MD. With the following instrument(s): Curette to remove Viable and Non-Viable tissue/material. Material removed includes Subcutaneous Tissue, Slough, Skin: Dermis, and Skin: Epidermis after achieving pain control using Lidocaine 4% Topical Solution. A time out was conducted at 10:12, prior to the start of the procedure. A Minimum amount of bleeding was controlled with Pressure. The procedure was tolerated well with a pain level of 0 throughout and a pain level of 0 following the procedure. Post Debridement Measurements: 0.4cm length x 0.4cm width x 0.1cm depth; 0.013cm^3 volume. Character of Wound/Ulcer Post Debridement is improved. Severity of Tissue Post Debridement is: Fat layer exposed. Post procedure Diagnosis Wound #2: Same as Pre-Procedure Plan Follow-up Appointments: Return appointment in 3 weeks. - 10/19/2019 Off-Loading: Low air-loss mattress (Group 2) - CONTINUE TO USE. Turn and reposition every 2 hours Other: - CONTINUE TO USE  BUNNY BOOTS FOR HEEL PROTECTION. UP IN WHEELCHAIR NO MORE THAT 2 HOUR INCREMENTS. Home Health: New wound care orders  this week; continue Home Health for wound care. May utilize formulary equivalent dressing for wound treatment orders unless otherwise specified. - [facility]. Laboratory ordered were: Anerobic culture - culture of right gluteal wound. WOUND #1: - Gluteus Wound Laterality: Left Cleanser: Wound Cleanser (Home Health) Every Other Day/30 Days Discharge Instructions: Cleanse the wound with wound cleanser prior to applying a clean dressing using gauze sponges, not tissue or cotton balls. Prim Dressing: Promogran Prisma Matrix, 4.34 (sq in) (silver collagen) (Home Health) Every Other Day/30 Days ary Discharge Instructions: Apply silver collagen to wound bed as instructed. BACK WITH SALINE GAUZE. Secondary Dressing: Woven Gauze Sponge, Non-Sterile 4x4 in [facility]) Every Other Day/30 Days Discharge Instructions: OR BORDERED FOAM. Apply over primary dressing as directed. WOUND #2: - [name] Fourth Wound Laterality: Left, Lateral oe Cleanser: Wound Cleanser (Home Health) Every Other Day/30 Days Discharge Instructions: Cleanse the wound with wound cleanser prior to applying a clean dressing using gauze sponges, not tissue or cotton balls. Prim Dressing: Iodosorb Gel 10 (gm) Tube [facility]) Every Other Day/30 Days ary Discharge Instructions: Apply to wound bed as instructed Secondary Dressing: Woven Gauze Sponges 2x2 in [facility]) Every Other Day/30 Days Discharge Instructions: Apply over primary dressing as directed. Secured With: Child psychotherapist, Sterile 2x75 (in/in) (Home Health) Every Other Day/30 Days Discharge Instructions: Secure with stretch gauze as directed. WOUND #3: - Gluteus Wound Laterality: Right Cleanser: Wound Cleanser (Home Health) Every Other Day/30 Days Discharge Instructions: Cleanse the wound with wound cleanser  prior to applying a clean dressing using gauze sponges, not tissue or cotton balls. Topical: triple antibiotic ointment [facility]) Every Other Day/30 Days Discharge Instructions: apply to wound area. Prim Dressing: triple abx Every Other Day/30 Days ary Secondary Dressing: Woven Gauze Sponge, Non-Sterile 4x4 in [facility]) Every Other Day/30 Days Discharge Instructions: OR BORDERED FOAM. Apply over primary dressing as directed. 1. I am still using silver collagen to the original area on the left buttock. 2. Topical antibiotic and a Band-Aid to the pinpoint area in the right buttock I am not sure that this is actually pus I have done a culture of this area for CandS 3. Still Iodoflex to the left fourth toe Electronic Signature(s) Signed: 09/28/2020 8:01:39 AM By: Linton Ham MD Entered By: Linton Ham on 09/27/2020 10:38:15 -------------------------------------------------------------------------------- SuperBill Details Patient Name: Date of Service: [name] HN [name]. 09/27/2020 Medical Record Number: [id number] Patient Account Number: [id number] Date of Birth/Sex: Treating RN: [birth year] (74 y.o. 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

## 2020-09-30 LAB — AEROBIC CULTURE W GRAM STAIN (SUPERFICIAL SPECIMEN): Culture: NO GROWTH

## 2020-10-12 ENCOUNTER — Other Ambulatory Visit: Payer: Self-pay | Admitting: Cardiology

## 2020-10-16 DIAGNOSIS — S90411D Abrasion, right great toe, subsequent encounter: Secondary | ICD-10-CM | POA: Diagnosis not present

## 2020-10-16 DIAGNOSIS — L89323 Pressure ulcer of left buttock, stage 3: Secondary | ICD-10-CM | POA: Diagnosis not present

## 2020-10-16 DIAGNOSIS — L89892 Pressure ulcer of other site, stage 2: Secondary | ICD-10-CM | POA: Diagnosis not present

## 2020-10-16 DIAGNOSIS — L89153 Pressure ulcer of sacral region, stage 3: Secondary | ICD-10-CM | POA: Diagnosis not present

## 2020-10-16 DIAGNOSIS — E1122 Type 2 diabetes mellitus with diabetic chronic kidney disease: Secondary | ICD-10-CM | POA: Diagnosis not present

## 2020-10-16 DIAGNOSIS — R1310 Dysphagia, unspecified: Secondary | ICD-10-CM | POA: Diagnosis not present

## 2020-10-16 DIAGNOSIS — Z794 Long term (current) use of insulin: Secondary | ICD-10-CM | POA: Diagnosis not present

## 2020-10-16 DIAGNOSIS — I130 Hypertensive heart and chronic kidney disease with heart failure and stage 1 through stage 4 chronic kidney disease, or unspecified chronic kidney disease: Secondary | ICD-10-CM | POA: Diagnosis not present

## 2020-10-16 DIAGNOSIS — N1832 Chronic kidney disease, stage 3b: Secondary | ICD-10-CM | POA: Diagnosis not present

## 2020-10-16 DIAGNOSIS — I69354 Hemiplegia and hemiparesis following cerebral infarction affecting left non-dominant side: Secondary | ICD-10-CM | POA: Diagnosis not present

## 2020-10-16 DIAGNOSIS — I5033 Acute on chronic diastolic (congestive) heart failure: Secondary | ICD-10-CM | POA: Diagnosis not present

## 2020-10-16 DIAGNOSIS — G20 Parkinson's disease: Secondary | ICD-10-CM | POA: Diagnosis not present

## 2020-10-17 DIAGNOSIS — R1310 Dysphagia, unspecified: Secondary | ICD-10-CM | POA: Diagnosis not present

## 2020-10-17 DIAGNOSIS — N1832 Chronic kidney disease, stage 3b: Secondary | ICD-10-CM | POA: Diagnosis not present

## 2020-10-17 DIAGNOSIS — G20 Parkinson's disease: Secondary | ICD-10-CM | POA: Diagnosis not present

## 2020-10-17 DIAGNOSIS — S90411D Abrasion, right great toe, subsequent encounter: Secondary | ICD-10-CM | POA: Diagnosis not present

## 2020-10-17 DIAGNOSIS — E1122 Type 2 diabetes mellitus with diabetic chronic kidney disease: Secondary | ICD-10-CM | POA: Diagnosis not present

## 2020-10-17 DIAGNOSIS — I5033 Acute on chronic diastolic (congestive) heart failure: Secondary | ICD-10-CM | POA: Diagnosis not present

## 2020-10-17 DIAGNOSIS — L89153 Pressure ulcer of sacral region, stage 3: Secondary | ICD-10-CM | POA: Diagnosis not present

## 2020-10-17 DIAGNOSIS — I69354 Hemiplegia and hemiparesis following cerebral infarction affecting left non-dominant side: Secondary | ICD-10-CM | POA: Diagnosis not present

## 2020-10-17 DIAGNOSIS — I130 Hypertensive heart and chronic kidney disease with heart failure and stage 1 through stage 4 chronic kidney disease, or unspecified chronic kidney disease: Secondary | ICD-10-CM | POA: Diagnosis not present

## 2020-10-17 DIAGNOSIS — L89892 Pressure ulcer of other site, stage 2: Secondary | ICD-10-CM | POA: Diagnosis not present

## 2020-10-17 DIAGNOSIS — L89323 Pressure ulcer of left buttock, stage 3: Secondary | ICD-10-CM | POA: Diagnosis not present

## 2020-10-17 DIAGNOSIS — Z794 Long term (current) use of insulin: Secondary | ICD-10-CM | POA: Diagnosis not present

## 2020-10-18 ENCOUNTER — Encounter (HOSPITAL_BASED_OUTPATIENT_CLINIC_OR_DEPARTMENT_OTHER): Payer: Medicare Other | Attending: Internal Medicine | Admitting: Internal Medicine

## 2020-10-18 ENCOUNTER — Other Ambulatory Visit: Payer: Self-pay

## 2020-10-18 DIAGNOSIS — L89892 Pressure ulcer of other site, stage 2: Secondary | ICD-10-CM | POA: Diagnosis not present

## 2020-10-18 DIAGNOSIS — X58XXXD Exposure to other specified factors, subsequent encounter: Secondary | ICD-10-CM | POA: Diagnosis not present

## 2020-10-18 DIAGNOSIS — L97512 Non-pressure chronic ulcer of other part of right foot with fat layer exposed: Secondary | ICD-10-CM | POA: Diagnosis not present

## 2020-10-18 DIAGNOSIS — L97528 Non-pressure chronic ulcer of other part of left foot with other specified severity: Secondary | ICD-10-CM | POA: Insufficient documentation

## 2020-10-18 DIAGNOSIS — E11621 Type 2 diabetes mellitus with foot ulcer: Secondary | ICD-10-CM | POA: Diagnosis not present

## 2020-10-18 DIAGNOSIS — C7951 Secondary malignant neoplasm of bone: Secondary | ICD-10-CM | POA: Diagnosis not present

## 2020-10-18 DIAGNOSIS — C61 Malignant neoplasm of prostate: Secondary | ICD-10-CM | POA: Diagnosis not present

## 2020-10-18 DIAGNOSIS — L89153 Pressure ulcer of sacral region, stage 3: Secondary | ICD-10-CM | POA: Diagnosis not present

## 2020-10-18 DIAGNOSIS — L97514 Non-pressure chronic ulcer of other part of right foot with necrosis of bone: Secondary | ICD-10-CM | POA: Insufficient documentation

## 2020-10-18 DIAGNOSIS — L89150 Pressure ulcer of sacral region, unstageable: Secondary | ICD-10-CM | POA: Diagnosis not present

## 2020-10-18 DIAGNOSIS — S31829D Unspecified open wound of left buttock, subsequent encounter: Secondary | ICD-10-CM | POA: Insufficient documentation

## 2020-10-18 DIAGNOSIS — I69954 Hemiplegia and hemiparesis following unspecified cerebrovascular disease affecting left non-dominant side: Secondary | ICD-10-CM | POA: Diagnosis not present

## 2020-10-18 DIAGNOSIS — E1151 Type 2 diabetes mellitus with diabetic peripheral angiopathy without gangrene: Secondary | ICD-10-CM | POA: Diagnosis not present

## 2020-10-18 DIAGNOSIS — L0231 Cutaneous abscess of buttock: Secondary | ICD-10-CM | POA: Insufficient documentation

## 2020-10-19 ENCOUNTER — Other Ambulatory Visit (HOSPITAL_COMMUNITY): Payer: Self-pay | Admitting: Internal Medicine

## 2020-10-19 DIAGNOSIS — L89323 Pressure ulcer of left buttock, stage 3: Secondary | ICD-10-CM | POA: Diagnosis not present

## 2020-10-19 DIAGNOSIS — I739 Peripheral vascular disease, unspecified: Secondary | ICD-10-CM

## 2020-10-19 DIAGNOSIS — L97519 Non-pressure chronic ulcer of other part of right foot with unspecified severity: Secondary | ICD-10-CM

## 2020-10-19 DIAGNOSIS — I69354 Hemiplegia and hemiparesis following cerebral infarction affecting left non-dominant side: Secondary | ICD-10-CM | POA: Diagnosis not present

## 2020-10-19 DIAGNOSIS — I5033 Acute on chronic diastolic (congestive) heart failure: Secondary | ICD-10-CM | POA: Diagnosis not present

## 2020-10-19 DIAGNOSIS — G20 Parkinson's disease: Secondary | ICD-10-CM | POA: Diagnosis not present

## 2020-10-19 DIAGNOSIS — L89892 Pressure ulcer of other site, stage 2: Secondary | ICD-10-CM | POA: Diagnosis not present

## 2020-10-19 DIAGNOSIS — E1122 Type 2 diabetes mellitus with diabetic chronic kidney disease: Secondary | ICD-10-CM | POA: Diagnosis not present

## 2020-10-19 DIAGNOSIS — L97529 Non-pressure chronic ulcer of other part of left foot with unspecified severity: Secondary | ICD-10-CM

## 2020-10-19 DIAGNOSIS — S90411D Abrasion, right great toe, subsequent encounter: Secondary | ICD-10-CM | POA: Diagnosis not present

## 2020-10-19 DIAGNOSIS — N1832 Chronic kidney disease, stage 3b: Secondary | ICD-10-CM | POA: Diagnosis not present

## 2020-10-19 DIAGNOSIS — I130 Hypertensive heart and chronic kidney disease with heart failure and stage 1 through stage 4 chronic kidney disease, or unspecified chronic kidney disease: Secondary | ICD-10-CM | POA: Diagnosis not present

## 2020-10-19 DIAGNOSIS — Z794 Long term (current) use of insulin: Secondary | ICD-10-CM | POA: Diagnosis not present

## 2020-10-19 DIAGNOSIS — L89153 Pressure ulcer of sacral region, stage 3: Secondary | ICD-10-CM | POA: Diagnosis not present

## 2020-10-19 DIAGNOSIS — R1310 Dysphagia, unspecified: Secondary | ICD-10-CM | POA: Diagnosis not present

## 2020-10-19 NOTE — Progress Notes (Signed)
[name], [name] ([id number]) . Visit Report for 10/18/2020 HPI Details Patient Name: Date of Service: [name], [name] [name] [name]. 10/18/2020 1:15 PM Medical Record Number: [id number] Patient Account Number: [id number] Date of Birth/Sex: Treating RN: Oct 07, 1946 (75 y.o. [name] Primary Care Provider: Jonathon [name] Other Clinician: Referring Provider: Treating Provider/Extender: [name] in Treatment: 11 History of Present Illness HPI Description: ADMISSION 07/27/2020 This is a 75 year old man who is accompanied by his wife (ex). Nevertheless she is his active caregiver. His most complicated features are remote CVA that left him with a left hemiparesis. He also has widely metastatic prostate cancer to bone which I think is contributed to increasing frailty this year and he is no longer ambulatory. He required hospitalization from 9/7 through 9/14 with respiratory failure seizures. During this time it was noted that he had an open area in the left buttock in close proximity to the gluteal cleft. His wife tells me that he has a history of perirectal abscesses. And she describes this area as starting as a painful swelling in late August certainly sounds like an abscess. When he is in the [name] he required bedside IandD's but did not go to the OR. Has CT scan of the area showed soft tissue thickening in the perianal ulcer. At that point it was concerning for a fistula connection however a discrete abscess was not seen. Notable that he was hypoalbuminemic in the [name] but actually came out better with an albumin of 3.1 on 9/14 his wife says he is eating well. They are currently treating this with Santyl ointment and a backing wet-to-dry dressing. By description of his wife this is done quite nicely and there is certainly less debris over the wound surface. They already have a [name] bed with a level 3 pressure relief surface. The patient lies on his back when he is  in bed especially at night although his wife is counseled him not to do this. They are starting sliding board transfers into his wheelchair I think with physical therapy. He has a Foley catheter in place Past medical history includes remote CVA, metastatic prostate cancer to large areas of his thoracic and lumbar spine and pelvis., Bilateral carotid artery stenosis, Parkinson's disease, type 2 diabetes, diastolic heart failure, CHF, hypertension and a recently discovered possible pancreatic mass in the head of the pancreas 08/16/2020 patient I admitted to the clinic 2-1/2 weeks ago. He had an abscess site on his left buttock that required an IandD while he was in the [name] in September. He has been using Santyl backing wet-to-dry. He is cared for her aerobically at home by his wife 2 concerning areas on his feet. A stage I pressure injury on the right heel this does not have an open wound. He also has on the lateral aspect of the left fourth toe a dry circular eschared wound. 12/2; monthly follow-up. Is a very disabled man who had an abscess in his left buttock and required an IandD. We have been using Santyl to this area I changed to silver collagen last time he is here and this actually looks quite a bit better The last time he was here his wife showed me an area on the left lateral fourth toe I think a pressure injury with his fifth toe. Been using Santyl here as well but not making much improvement As well she shows me he has an area on his right buttock which is very tiny pinpoint area but under illumination still  obviously an open area. I wonder if this is more of a shear injury and transferring and a pressure injury per se 12/16; this is a very disabled man who has a left buttock area that required an IandD apparently an abscess. Since he has come here that he developed a area on the right buttock. His wife says that this is draining pus. Finally he has an area on the lateral part of the  left fourth toe. We have been using silver collagen on the buttock and Iodoflex on the fourth toe 1/6; this is a disabled man we have been working on a left buttock area that was initially an IandD abscess injury. We have got this down to a small area in fact it appears to be epithelializing towards the base of the wound. When he was here last time he had a small draining area on the right buttock. I thought this might be a small cyst culture of the drainage was negative. I do not think this was an abscess at all While he has been here he has been developing areas on the toes on his left foot. He initially had one on the left fourth toe and then last time the right first toe. He is a type II diabetic. With he has a history of coronary artery disease. He complains of a lot of pain in the right foot Electronic Signature(s) Signed: 10/18/2020 5:18:36 PM By: Linton Ham MD Entered By: Linton Ham on 10/18/2020 14:41:13 -------------------------------------------------------------------------------- Physical Exam Details Patient Name: Date of Service: [name] HN [name]. 10/18/2020 1:15 PM Medical Record Number: [id number] Patient Account Number: [id number] Date of Birth/Sex: Treating RN: 08/05/1946 (75 y.o. [name] Primary Care Provider: Jonathon [name] Other Clinician: Referring Provider: Treating Provider/Extender: [name] in Treatment: 11 Constitutional Patient is hypotensive. However he appears well. Pulse regular and within target range for patient.Marland Kitchen Respirations regular, non-labored and within target range.. Temperature is normal and within the target range for the patient.Marland Kitchen Appears in no distress. Cardiovascular Femoral pulses palpable but the popliteal pulses not. Dorsalis pedis and posterior tibial pulses are difficult to feel. Notes Wound exam He now has areas on the left fourth right first and right fifth toes. The area on the right first  goes to bone. The right fourth toe does not look particularly healthy as well. Using Dopplers we are able to identify pulses at the dorsalis pedis on the right monophasic waveforms. His ABI calculated at 0.3 on the right Electronic Signature(s) Signed: 10/18/2020 5:18:36 PM By: Linton Ham MD Entered By: Linton Ham on 10/18/2020 14:42:07 -------------------------------------------------------------------------------- Physician Orders Details Patient Name: Date of Service: [name] HN [name]. 10/18/2020 1:15 PM Medical Record Number: [id number] Patient Account Number: [id number] Date of Birth/Sex: Treating RN: 03/04/1946 (75 y.o. [name] Primary Care Provider: Jonathon [name] Other Clinician: Referring Provider: Treating Provider/Extender: [name] in Treatment: 11 Verbal / Phone Orders: No Diagnosis Coding ICD-10 Coding Code Description L02.31 Cutaneous abscess of buttock S31.829D Unspecified open wound of left buttock, subsequent encounter L97.528 Non-pressure chronic ulcer of other part of left foot with other specified severity L89.312 Pressure ulcer of right buttock, stage 2 Follow-up Appointments Return Appointment in 1 week. Off-Loading Low air-loss mattress (Group 2) - CONTINUE TO USE. Turn and reposition every 2 hours Other: - CONTINUE TO USE BUNNY BOOTS FOR HEEL PROTECTION. UP IN WHEELCHAIR NO MORE THAT 2 HOUR INCREMENTS. Home Health New wound care orders this week; continue Home Health  for wound care. May utilize formulary equivalent dressing for wound treatment orders unless otherwise specified. - [facility]. Wound Treatment Wound #1 - Gluteus Wound Laterality: Left Cleanser: Wound Cleanser (Home Health) Every Other Day/30 Days Discharge Instructions: Cleanse the wound with wound cleanser prior to applying a clean dressing using gauze sponges, not tissue or cotton balls. Prim Dressing:  Promogran Prisma Matrix, 4.34 (sq in) (silver collagen) (Home Health) Every Other Day/30 Days ary Discharge Instructions: Apply silver collagen to wound bed as instructed. BACK WITH SALINE GAUZE. Secondary Dressing: Woven Gauze Sponge, Non-Sterile 4x4 in [facility]) Every Other Day/30 Days Discharge Instructions: OR BORDERED FOAM. Apply over primary dressing as directed. Wound #2 - [name] Fourth oe Wound Laterality: Left, Lateral Cleanser: Wound Cleanser (Home Health) Every Other Day/30 Days Discharge Instructions: Cleanse the wound with wound cleanser prior to applying a clean dressing using gauze sponges, not tissue or cotton balls. Prim Dressing: KerraCel Ag Gelling Fiber Dressing, 2x2 in (silver alginate) (Home Health) Every Other Day/30 Days ary Discharge Instructions: Apply silver alginate to wound bed as instructed Secondary Dressing: Woven Gauze Sponges 2x2 in [facility]) Every Other Day/30 Days Discharge Instructions: Apply over primary dressing as directed. Secured With: Child psychotherapist, Sterile 2x75 (in/in) (Home Health) Every Other Day/30 Days Discharge Instructions: Secure with stretch gauze as directed. Wound #4 - [name] Great oe Wound Laterality: Right Cleanser: Wound Cleanser (Home Health) Every Other Day/30 Days Discharge Instructions: Cleanse the wound with wound cleanser prior to applying a clean dressing using gauze sponges, not tissue or cotton balls. Prim Dressing: KerraCel Ag Gelling Fiber Dressing, 2x2 in (silver alginate) (Home Health) Every Other Day/30 Days ary Discharge Instructions: Apply silver alginate to wound bed as instructed Secondary Dressing: Woven Gauze Sponges 2x2 in [facility]) Every Other Day/30 Days Discharge Instructions: Apply over primary dressing as directed. Secured With: Child psychotherapist, Sterile 2x75 (in/in) (Home Health) Every Other Day/30 Days Discharge Instructions: Secure with stretch gauze as directed. Wound  #5 - [name] Fifth oe Wound Laterality: Right Cleanser: Wound Cleanser (Home Health) Every Other Day/30 Days Discharge Instructions: Cleanse the wound with wound cleanser prior to applying a clean dressing using gauze sponges, not tissue or cotton balls. Prim Dressing: KerraCel Ag Gelling Fiber Dressing, 2x2 in (silver alginate) (Home Health) Every Other Day/30 Days ary Discharge Instructions: Apply silver alginate to wound bed as instructed Secondary Dressing: Woven Gauze Sponges 2x2 in [facility]) Every Other Day/30 Days Discharge Instructions: Apply over primary dressing as directed. Secured With: Child psychotherapist, Sterile 2x75 (in/in) (Home Health) Every Other Day/30 Days Discharge Instructions: Secure with stretch gauze as directed. Services and Therapies rterial Studies- Bilateral - **Urgent** Referral to Dr. Kennon Holter office to perform Arterial studies bilaterally with ABIs and TBIs related to ischemic A wounds to tips of toes and poor peripheral arterial bloodflow. ICD 10 code I73.9 Electronic Signature(s) Signed: 10/18/2020 5:18:36 PM By: Linton Ham MD Signed: 10/18/2020 6:28:52 PM By: Deon Pilling Entered By: Deon Pilling on 10/18/2020 14:36:01 Prescription 10/18/2020 -------------------------------------------------------------------------------- Junie Bame MD Patient Name: Provider: 1945-12-09 [id number] Date of Birth: NPI#: Jerilynn Mages [id number] Sex: DEA #: [phone] [id number] Phone #: License #: [employer] Patient Address: [facility] [address] [place], [place] [address] [place], [place] [address] [phone] Allergies Altace; cephalexin Provider's Orders rterial Studies- Bilateral - **Urgent** Referral to Dr. Kennon Holter office to perform Arterial studies bilaterally with ABIs  and TBIs related to ischemic A wounds to tips of toes and poor peripheral arterial bloodflow. ICD 10 code  I73.9 Hand Signature: Date(s): Electronic Signature(s) Signed: 10/18/2020 5:18:36 PM By: Linton Ham MD Signed: 10/18/2020 6:28:52 PM By: Deon Pilling Entered By: Deon Pilling on 10/18/2020 14:36:10 -------------------------------------------------------------------------------- Problem List Details Patient Name: Date of Service: [name] HN [name]. 10/18/2020 1:15 PM Medical Record Number: [id number] Patient Account Number: [id number] Date of Birth/Sex: Treating RN: [birth year] (75 y.o. [name] Primary Care Provider: Jonathon [name] Other Clinician: Referring Provider: Treating Provider/Extender: [name] in Treatment: 11 Active Problems ICD-10 Encounter Code Description Active Date MDM Diagnosis L02.31 Cutaneous abscess of buttock 07/27/2020 No Yes S31.829D Unspecified open wound of left buttock, subsequent encounter 07/27/2020 No Yes L97.528 Non-pressure chronic ulcer of other part of left foot with other specified 08/16/2020 No Yes severity L97.514 Non-pressure chronic ulcer of other part of right foot with necrosis of bone 10/18/2020 No Yes E11.51 Type 2 diabetes mellitus with diabetic peripheral angiopathy without gangrene 10/18/2020 No Yes Inactive Problems ICD-10 Code Description Active Date Inactive Date L89.611 Pressure ulcer of right heel, stage 1 08/16/2020 08/16/2020 L89.312 Pressure ulcer of right buttock, stage 2 09/13/2020 09/13/2020 Resolved Problems Electronic Signature(s) Signed: 10/18/2020 5:18:36 PM By: Linton Ham MD Entered By: Linton Ham on 10/18/2020 14:45:57 -------------------------------------------------------------------------------- Progress Note Details Patient Name: Date of Service: [name] HN [name]. 10/18/2020 1:15 PM Medical Record Number: [id number] Patient Account Number: [id number] Date of Birth/Sex: Treating RN: 1946-03-29 (75 y.o. [name] Primary Care Provider: Jonathon [name] Other  Clinician: Referring Provider: Treating Provider/Extender: [name] in Treatment: 11 Subjective History of Present Illness (HPI) ADMISSION 07/27/2020 This is a 75 year old man who is accompanied by his wife (ex). Nevertheless she is his active caregiver. His most complicated features are remote CVA that left him with a left hemiparesis. He also has widely metastatic prostate cancer to bone which I think is contributed to increasing frailty this year and he is no longer ambulatory. He required hospitalization from 9/7 through 9/14 with respiratory failure seizures. During this time it was noted that he had an open area in the left buttock in close proximity to the gluteal cleft. His wife tells me that he has a history of perirectal abscesses. And she describes this area as starting as a painful swelling in late August certainly sounds like an abscess. When he is in the [name] he required bedside IandD's but did not go to the OR. Has CT scan of the area showed soft tissue thickening in the perianal ulcer. At that point it was concerning for a fistula connection however a discrete abscess was not seen. Notable that he was hypoalbuminemic in the [name] but actually came out better with an albumin of 3.1 on 9/14 his wife says he is eating well. They are currently treating this with Santyl ointment and a backing wet-to-dry dressing. By description of his wife this is done quite nicely and there is certainly less debris over the wound surface. They already have a [name] bed with a level 3 pressure relief surface. The patient lies on his back when he is in bed especially at night although his wife is counseled him not to do this. They are starting sliding board transfers into his wheelchair I think with physical therapy. He has a Foley catheter in place Past medical history includes remote CVA, metastatic prostate cancer to large areas of his thoracic and lumbar spine  and pelvis.,  Bilateral carotid artery stenosis, Parkinson's disease, type 2 diabetes, diastolic heart failure, CHF, hypertension and a recently discovered possible pancreatic mass in the head of the pancreas 08/16/2020 patient I admitted to the clinic 2-1/2 weeks ago. He had an abscess site on his left buttock that required an IandD while he was in the [name] in September. He has been using Santyl backing wet-to-dry. He is cared for her aerobically at home by his wife 2 concerning areas on his feet. A stage I pressure injury on the right heel this does not have an open wound. He also has on the lateral aspect of the left fourth toe a dry circular eschared wound. 12/2; monthly follow-up. Is a very disabled man who had an abscess in his left buttock and required an IandD. We have been using Santyl to this area I changed to silver collagen last time he is here and this actually looks quite a bit better ooThe last time he was here his wife showed me an area on the left lateral fourth toe I think a pressure injury with his fifth toe. Been using Santyl here as well but not making much improvement ooAs well she shows me he has an area on his right buttock which is very tiny pinpoint area but under illumination still obviously an open area. I wonder if this is more of a shear injury and transferring and a pressure injury per se 12/16; this is a very disabled man who has a left buttock area that required an IandD apparently an abscess. Since he has come here that he developed a area on the right buttock. His wife says that this is draining pus. Finally he has an area on the lateral part of the left fourth toe. We have been using silver collagen on the buttock and Iodoflex on the fourth toe 1/6; this is a disabled man we have been working on a left buttock area that was initially an IandD abscess injury. We have got this down to a small area in fact it appears to be epithelializing towards the base of the  wound. When he was here last time he had a small draining area on the right buttock. I thought this might be a small cyst culture of the drainage was negative. I do not think this was an abscess at all While he has been here he has been developing areas on the toes on his left foot. He initially had one on the left fourth toe and then last time the right first toe. He is a type II diabetic. With he has a history of coronary artery disease. He complains of a lot of pain in the right foot Objective Constitutional Patient is hypotensive. However he appears well. Pulse regular and within target range for patient.Marland Kitchen Respirations regular, non-labored and within target range.. Temperature is normal and within the target range for the patient.Marland Kitchen Appears in no distress. Vitals Time Taken: 1:43 PM, Height: 64 in, Weight: 150 lbs, BMI: 25.7, Temperature: 97.8 F, Pulse: 70 bpm, Respiratory Rate: 16 breaths/min, Blood Pressure: 94/58 mmHg, Capillary Blood Glucose: 124 mg/dl. General Notes: glucose per pt report Cardiovascular Femoral pulses palpable but the popliteal pulses not. Dorsalis pedis and posterior tibial pulses are difficult to feel. General Notes: Wound exam ooHe now has areas on the left fourth right first and right fifth toes. The area on the right first goes to bone. The right fourth toe does not look particularly healthy as well. Using Dopplers we are able to  identify pulses at the dorsalis pedis on the right monophasic waveforms. His ABI calculated at 0.3 on the right Integumentary (Hair, Skin) Wound #1 status is Open. Original cause of wound was Gradually Appeared. The wound is located on the Left Gluteus. The wound measures 0.6cm length x 0.4cm width x 0.4cm depth; 0.188cm^2 area and 0.075cm^3 volume. There is Fat Layer (Subcutaneous Tissue) exposed. There is no tunneling or undermining noted. There is a medium amount of serosanguineous drainage noted. The wound margin is epibole. There is  large (67-100%) pink granulation within the wound bed. There is no necrotic tissue within the wound bed. Wound #2 status is Open. Original cause of wound was Gradually Appeared. The wound is located on the Left,Lateral [name] Fourth. The wound measures 0.5cm oe length x 0.4cm width x 0.2cm depth; 0.157cm^2 area and 0.031cm^3 volume. There is no tunneling or undermining noted. There is a small amount of serous drainage noted. The wound margin is flat and intact. There is no granulation within the wound bed. There is a large (67-100%) amount of necrotic tissue within the wound bed including Adherent Slough. Wound #3 status is Healed - Epithelialized. Original cause of wound was Shear/Friction. The wound is located on the Right Gluteus. The wound measures 0cm length x 0cm width x 0cm depth; 0cm^2 area and 0cm^3 volume. There is no tunneling or undermining noted. There is a none present amount of drainage noted. The wound margin is distinct with the outline attached to the wound base. There is no granulation within the wound bed. There is no necrotic tissue within the wound bed. Wound #4 status is Open. Original cause of wound was Not Known. The wound is located on the Right [name] Great. The wound measures 1.4cm length x 1cm oe width x 0.1cm depth; 1.1cm^2 area and 0.11cm^3 volume. There is bone and Fat Layer (Subcutaneous Tissue) exposed. There is no tunneling or undermining noted. There is a medium amount of serosanguineous drainage noted. The wound margin is flat and intact. There is small (1-33%) pink granulation within the wound bed. There is a large (67-100%) amount of necrotic tissue within the wound bed including Adherent Slough. Wound #5 status is Open. Original cause of wound was Not Known. The wound is located on the Right [name] Fifth. The wound measures 1cm length x 0.5cm oe width x 0.1cm depth; 0.393cm^2 area and 0.039cm^3 volume. There is Fat Layer (Subcutaneous Tissue) exposed. There is no tunneling  or undermining noted. There is a medium amount of serosanguineous drainage noted. The wound margin is flat and intact. There is small (1-33%) pink granulation within the wound bed. There is a large (67-100%) amount of necrotic tissue within the wound bed including Eschar. Assessment Active Problems ICD-10 Cutaneous abscess of buttock Unspecified open wound of left buttock, subsequent encounter Non-pressure chronic ulcer of other part of left foot with other specified severity Pressure ulcer of right buttock, stage 2 Plan Follow-up Appointments: Return Appointment in 1 week. Off-Loading: Low air-loss mattress (Group 2) - CONTINUE TO USE. Turn and reposition every 2 hours Other: - CONTINUE TO USE BUNNY BOOTS FOR HEEL PROTECTION. UP IN WHEELCHAIR NO MORE THAT 2 HOUR INCREMENTS. Home Health: New wound care orders this week; continue Home Health for wound care. May utilize formulary equivalent dressing for wound treatment orders unless otherwise specified. - [facility]. Services and Therapies ordered were: Arterial Studies- Bilateral - **Urgent** Referral to Dr. Kennon Holter office to perform Arterial studies bilaterally with ABIs and  TBIs related to ischemic wounds to tips of toes and poor peripheral arterial bloodflow. ICD 10 code I73.9 WOUND #1: - Gluteus Wound Laterality: Left Cleanser: Wound Cleanser (Home Health) Every Other Day/30 Days Discharge Instructions: Cleanse the wound with wound cleanser prior to applying a clean dressing using gauze sponges, not tissue or cotton balls. Prim Dressing: Promogran Prisma Matrix, 4.34 (sq in) (silver collagen) (Home Health) Every Other Day/30 Days ary Discharge Instructions: Apply silver collagen to wound bed as instructed. BACK WITH SALINE GAUZE. Secondary Dressing: Woven Gauze Sponge, Non-Sterile 4x4 in [facility]) Every Other Day/30 Days Discharge Instructions: OR BORDERED FOAM. Apply over primary dressing as  directed. WOUND #2: - [name] Fourth Wound Laterality: Left, Lateral oe Cleanser: Wound Cleanser (Home Health) Every Other Day/30 Days Discharge Instructions: Cleanse the wound with wound cleanser prior to applying a clean dressing using gauze sponges, not tissue or cotton balls. Prim Dressing: KerraCel Ag Gelling Fiber Dressing, 2x2 in (silver alginate) (Home Health) Every Other Day/30 Days ary Discharge Instructions: Apply silver alginate to wound bed as instructed Secondary Dressing: Woven Gauze Sponges 2x2 in [facility]) Every Other Day/30 Days Discharge Instructions: Apply over primary dressing as directed. Secured With: Child psychotherapist, Sterile 2x75 (in/in) (Home Health) Every Other Day/30 Days Discharge Instructions: Secure with stretch gauze as directed. WOUND #4: - [name] Great Wound Laterality: Right oe Cleanser: Wound Cleanser (Home Health) Every Other Day/30 Days Discharge Instructions: Cleanse the wound with wound cleanser prior to applying a clean dressing using gauze sponges, not tissue or cotton balls. Prim Dressing: KerraCel Ag Gelling Fiber Dressing, 2x2 in (silver alginate) (Home Health) Every Other Day/30 Days ary Discharge Instructions: Apply silver alginate to wound bed as instructed Secondary Dressing: Woven Gauze Sponges 2x2 in [facility]) Every Other Day/30 Days Discharge Instructions: Apply over primary dressing as directed. Secured With: Child psychotherapist, Sterile 2x75 (in/in) (Home Health) Every Other Day/30 Days Discharge Instructions: Secure with stretch gauze as directed. WOUND #5: - [name] Fifth Wound Laterality: Right oe Cleanser: Wound Cleanser (Home Health) Every Other Day/30 Days Discharge Instructions: Cleanse the wound with wound cleanser prior to applying a clean dressing using gauze sponges, not tissue or cotton balls. Prim Dressing: KerraCel Ag Gelling Fiber Dressing, 2x2 in (silver alginate) (Home Health) Every Other Day/30  Days ary Discharge Instructions: Apply silver alginate to wound bed as instructed Secondary Dressing: Woven Gauze Sponges 2x2 in [facility]) Every Other Day/30 Days Discharge Instructions: Apply over primary dressing as directed. Secured With: Child psychotherapist, Sterile 2x75 (in/in) (Home Health) Every Other Day/30 Days Discharge Instructions: Secure with stretch gauze as directed. #1 the patient still has a small wound on the right buttock. This may be epithelializing inward the base of the wound is still open. We are using silver collagen 2. He had an area on the left fourth toe now the right first and fifth toes. The right fourth toe itself does not look that good. I could not identify pedal pulses or popliteal pulses. Calculated ABI in our clinic was only 0.33 notable monophasic waveforms 3. The patient is known to Dr. Gwenlyn Found for cardiology. Were going to send him in ASAP order for arterial studies bilaterally. I suspect these will be poor. May require an angiogram if he is able to have 1 Electronic Signature(s) Signed: 10/18/2020 5:18:36 PM By: Linton Ham MD Entered By: Linton Ham on 10/18/2020 14:44:03 -------------------------------------------------------------------------------- SuperBill Details Patient Name: Date of Service: [name] HN [name]. 10/18/2020 Medical Record Number:  [id number] Patient Account Number: [id number] Date of Birth/Sex: Treating RN: Apr 16, 1946 (75 y.o. 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 E11.51 Type 2 diabetes mellitus with diabetic peripheral angiopathy without gangrene Facility Procedures CPT4 Code: [id number] Description: [phone] - WOUND CARE VISIT-LEV 5 EST PT Modifier: Quantity: 1 Physician Procedures : CPT4 Code Description Modifier [id number] - WC PHYS LEVEL 3 - EST PT ICD-10 Diagnosis Description L02.31 Cutaneous abscess of buttock S31.829D Unspecified open wound of left buttock, subsequent encounter L97.528 Non-pressure chronic ulcer of other  part of left foot with other specified severity E11.51 Type 2 diabetes mellitus with diabetic peripheral angiopathy without gangrene Quantity: 1 Electronic Signature(s) Signed: 10/18/2020 6:28:52 PM By: Deon Pilling Signed: 10/19/2020 4:48:18 PM By: Linton Ham MD Previous Signature: 10/18/2020 5:18:36 PM Version By: Linton Ham MD Entered By: Deon Pilling on 10/18/2020 18:04:58

## 2020-10-22 ENCOUNTER — Telehealth: Payer: Self-pay | Admitting: Diagnostic Neuroimaging

## 2020-10-22 DIAGNOSIS — R1310 Dysphagia, unspecified: Secondary | ICD-10-CM | POA: Diagnosis not present

## 2020-10-22 DIAGNOSIS — Z794 Long term (current) use of insulin: Secondary | ICD-10-CM | POA: Diagnosis not present

## 2020-10-22 DIAGNOSIS — L89323 Pressure ulcer of left buttock, stage 3: Secondary | ICD-10-CM | POA: Diagnosis not present

## 2020-10-22 DIAGNOSIS — G20 Parkinson's disease: Secondary | ICD-10-CM | POA: Diagnosis not present

## 2020-10-22 DIAGNOSIS — L89892 Pressure ulcer of other site, stage 2: Secondary | ICD-10-CM | POA: Diagnosis not present

## 2020-10-22 DIAGNOSIS — L89153 Pressure ulcer of sacral region, stage 3: Secondary | ICD-10-CM | POA: Diagnosis not present

## 2020-10-22 DIAGNOSIS — S90411D Abrasion, right great toe, subsequent encounter: Secondary | ICD-10-CM | POA: Diagnosis not present

## 2020-10-22 DIAGNOSIS — E1122 Type 2 diabetes mellitus with diabetic chronic kidney disease: Secondary | ICD-10-CM | POA: Diagnosis not present

## 2020-10-22 DIAGNOSIS — R339 Retention of urine, unspecified: Secondary | ICD-10-CM | POA: Diagnosis not present

## 2020-10-22 DIAGNOSIS — I130 Hypertensive heart and chronic kidney disease with heart failure and stage 1 through stage 4 chronic kidney disease, or unspecified chronic kidney disease: Secondary | ICD-10-CM | POA: Diagnosis not present

## 2020-10-22 DIAGNOSIS — I69354 Hemiplegia and hemiparesis following cerebral infarction affecting left non-dominant side: Secondary | ICD-10-CM | POA: Diagnosis not present

## 2020-10-22 DIAGNOSIS — N1832 Chronic kidney disease, stage 3b: Secondary | ICD-10-CM | POA: Diagnosis not present

## 2020-10-22 DIAGNOSIS — I5033 Acute on chronic diastolic (congestive) heart failure: Secondary | ICD-10-CM | POA: Diagnosis not present

## 2020-10-22 NOTE — Progress Notes (Signed)
Luis, SERVISS [name] ([id number]) . Visit Report for 10/18/2020 Arrival Information Details Patient Name: Date of Service: [name], [name] [facility] [name]. 10/18/2020 1:15 PM Medical Record Number: [id number] Patient Account Number: [id number] Date of Birth/Sex: Treating RN: October 19, 1945 (75 y.o. [name] Primary Care Provider: [name] Other Clinician: Referring Provider: Treating Provider/Extender: [name] in Treatment: 11 Visit Information History Since Last Visit Added or deleted any medications: No Patient Arrived: Wheel Chair Any new allergies or adverse reactions: No Arrival Time: 13:42 Had a fall or experienced change in No Accompanied By: spouse activities of daily living that may affect Transfer Assistance: None risk of falls: Patient Identification Verified: Yes Signs or symptoms of abuse/neglect since last visito No Secondary Verification Process Completed: Yes Hospitalized since last visit: No Patient Requires Transmission-Based Precautions: No Implantable device outside of the clinic excluding No Patient Has Alerts: Yes cellular tissue based products placed in the center Patient Alerts: Patient on Blood Thinner since last visit: Has Dressing in Place as Prescribed: Yes Pain Present Now: No Electronic Signature(s) Signed: 10/22/2020 5:04:19 PM By: Levan Hurst RN, BSN Entered By: Levan Hurst on 10/18/2020 13:43:16 -------------------------------------------------------------------------------- Clinic Level of Care Assessment Details Patient Name: Date of Service: Luis Pence [facility] [name]. 10/18/2020 1:15 PM Medical Record Number: [id number] Patient Account Number: [id number] Date of Birth/Sex: Treating RN: [birth year] (75 y.o. [name] Primary Care Provider: [name] Other Clinician: Referring Provider: Treating Provider/Extender: [name] in Treatment: 11 Clinic Level of Care Assessment Items TOOL  4 Quantity Score X- 1 0 Use when only an EandM is performed on FOLLOW-UP visit ASSESSMENTS - Nursing Assessment / Reassessment X- 1 10 Reassessment of Co-morbidities (includes updates in patient status) X- 1 5 Reassessment of Adherence to Treatment Plan ASSESSMENTS - Wound and Skin A ssessment / Reassessment []  - 0 Simple Wound Assessment / Reassessment - one wound X- 4 5 Complex Wound Assessment / Reassessment - multiple wounds X- 1 10 Dermatologic / Skin Assessment (not related to wound area) ASSESSMENTS - Focused Assessment []  - 0 Circumferential Edema Measurements - multi extremities X- 1 10 Nutritional Assessment / Counseling / Intervention []  - 0 Lower Extremity Assessment (monofilament, tuning fork, pulses) []  - 0 Peripheral Arterial Disease Assessment (using hand held doppler) ASSESSMENTS - Ostomy and/or Continence Assessment and Care []  - 0 Incontinence Assessment and Management []  - 0 Ostomy Care Assessment and Management (repouching, etc.) PROCESS - Coordination of Care []  - 0 Simple Patient / Family Education for ongoing care X- 1 20 Complex (extensive) Patient / Family Education for ongoing care X- 1 10 Staff obtains Programmer, systems, Records, [name] Results / Process Orders est X- 1 10 Staff telephones HHA, Nursing Homes / Clarify orders / etc []  - 0 Routine Transfer to another Facility (non-emergent condition) []  - 0 Routine Hospital Admission (non-emergent condition) []  - 0 New Admissions / Biomedical engineer / Ordering NPWT Apligraf, etc. , []  - 0 Emergency Hospital Admission (emergent condition) []  - 0 Simple Discharge Coordination X- 1 15 Complex (extensive) Discharge Coordination PROCESS - Special Needs []  - 0 Pediatric / Minor Patient Management []  - 0 Isolation Patient Management []  - 0 Hearing / Language / Visual special needs []  - 0 Assessment of Community assistance (transportation, D/C planning, etc.) []  - 0 Additional assistance /  Altered mentation []  - 0 Support Surface(s) Assessment (bed, cushion, seat, etc.) INTERVENTIONS - Wound Cleansing / Measurement []  - 0 Simple Wound Cleansing - one wound X- 4 5 Complex  Wound Cleansing - multiple wounds X- 1 5 Wound Imaging (photographs - any number of wounds) $RemoveBe'[]'MIdNEOFtE$  - 0 Wound Tracing (instead of photographs) $RemoveBeforeD'[]'SxXPabqrtRWBCC$  - 0 Simple Wound Measurement - one wound X- 4 5 Complex Wound Measurement - multiple wounds INTERVENTIONS - Wound Dressings X - Small Wound Dressing one or multiple wounds 3 10 $Re'[]'FcQ$  - 0 Medium Wound Dressing one or multiple wounds $RemoveBeforeD'[]'OTYLEpWhUuvGWA$  - 0 Large Wound Dressing one or multiple wounds $RemoveBeforeD'[]'YBxMZaBAansuCx$  - 0 Application of Medications - topical $RemoveB'[]'SFIAWFbb$  - 0 Application of Medications - injection INTERVENTIONS - Miscellaneous $RemoveBeforeD'[]'RZIolVXAEfXPTK$  - 0 External ear exam $Remove'[]'ubCZuAq$  - 0 Specimen Collection (cultures, biopsies, blood, body fluids, etc.) $RemoveBefor'[]'olDcumaxruHT$  - 0 Specimen(s) / Culture(s) sent or taken to Lab for analysis $RemoveBefo'[]'prkYIAzuwVL$  - 0 Patient Transfer (multiple staff / Civil Service fast streamer / Similar devices) $RemoveBeforeDE'[]'YoCpPSqkNryOQZf$  - 0 Simple Staple / Suture removal (25 or less) $Remove'[]'SZYKDHy$  - 0 Complex Staple / Suture removal (26 or more) $Remove'[]'lPSVbKu$  - 0 Hypo / Hyperglycemic Management (close monitor of Blood Glucose) $RemoveBefore'[]'FyvFOzlsEOKTn$  - 0 Ankle / Brachial Index (ABI) - do not check if billed separately X- 1 5 Vital Signs Has the patient been seen at the hospital within the last three years: Yes Total Score: 190 Level Of Care: New/Established - Level 5 Electronic Signature(s) Signed: 10/18/2020 6:28:52 PM By: Deon Pilling Entered By: Deon Pilling on 10/18/2020 18:04:42 -------------------------------------------------------------------------------- Encounter Discharge Information Details Patient Name: Date of Service: Luis Pence HN [name]. 10/18/2020 1:15 PM Medical Record Number: [id number] Patient Account Number: [id number] Date of Birth/Sex: Treating RN: 1946/03/20 (75 y.o. Ernestene Mention Primary Care Provider: [name] Other Clinician: Referring  Provider: Treating Provider/Extender: [name] in Treatment: 11 Encounter Discharge Information Items Discharge Condition: Stable Ambulatory Status: Wheelchair Discharge Destination: Home Transportation: Private Auto Accompanied By: spouse Schedule Follow-up Appointment: Yes Clinical Summary of Care: Patient Declined Electronic Signature(s) Signed: 10/18/2020 6:02:34 PM By: Baruch Gouty RN, BSN Entered By: Baruch Gouty on 10/18/2020 15:08:12 -------------------------------------------------------------------------------- Lower Extremity Assessment Details Patient Name: Date of Service: Luis Pence HN [name]. 10/18/2020 1:15 PM Medical Record Number: [id number] Patient Account Number: [id number] Date of Birth/Sex: Treating RN: 1946-04-22 (75 y.o. [name] Primary Care Provider: [name] Other Clinician: Referring Provider: Treating Provider/Extender: [name] in Treatment: 11 Edema Assessment Assessed: Shirlyn Goltz: No] Patrice Paradise: No] Edema: [Left: N] [Right: o] Vascular Assessment Blood Pressure: Brachial: [Right:136] Ankle: [Right:Dorsalis Pedis: 45 0.33] Electronic Signature(s) Signed: 10/18/2020 6:28:52 PM By: Deon Pilling Signed: 10/18/2020 6:28:52 PM By: Deon Pilling Entered By: Deon Pilling on 10/18/2020 14:32:36 -------------------------------------------------------------------------------- Multi Wound Chart Details Patient Name: Date of Service: Luis Pence HN [name]. 10/18/2020 1:15 PM Medical Record Number: [id number] Patient Account Number: [id number] Date of Birth/Sex: Treating RN: [birth year] (75 y.o. [name] Primary Care Provider: [name] Other Clinician: Referring Provider: Treating Provider/Extender: [name] in Treatment: 11 Vital Signs Height(in): 92 Capillary Blood Glucose(mg/dl): 124 Weight(lbs): 150 Pulse(bpm): 42 Body Mass Index(BMI):  26 Blood Pressure(mmHg): 94/58 Temperature(F): 97.8 Respiratory Rate(breaths/min): 16 Photos: [1:No Photos Left Gluteus] [2:No Photos Left, Lateral [name] Fourth oe] [3:No Photos Right Gluteus] Wound Location: [1:Gradually Appeared] [2:Gradually Appeared] [3:Shear/Friction] Wounding Event: [1:Abscess] [2:Diabetic Wound/Ulcer of the Lower] [3:Pressure Ulcer] Primary Etiology: [1:Congestive Heart Failure, Coronary Congestive Heart Failure, Coronary] [2:Extremity] [3:Congestive Heart Failure, Coronary] Comorbid History: [1:Artery Disease, Hypertension, Type II Artery Disease, Hypertension, Type II Diabetes, Seizure Disorder 05/30/2019] [2:Diabetes, Seizure Disorder 08/16/2020] [3:Artery Disease, Hypertension, Type II Diabetes, Seizure Disorder 09/13/2020] Date Acquired: [1:11] [2:9] [3:5] Weeks  of Treatment: [1:Open] [2:Open] [3:Healed - Epithelialized] Wound Status: [1:0.6x0.4x0.4] [2:0.5x0.4x0.2] [3:0x0x0] Measurements L x W x D (cm) [1:0.188] [2:0.157] [3:0] A (cm) : rea [1:0.075] [2:0.031] [3:0] Volume (cm) : [1:96.90%] [2:-24.60%] [3:100.00%] % Reduction in Area: [1:99.40%] [2:-138.50%] [3:100.00%] % Reduction in Volume: [1:Full Thickness Without Exposed] [2:Grade 2] [3:Category/Stage II] Classification: [1:Support Structures Medium] [2:Small] [3:None Present] Exudate A mount: [1:Serosanguineous] [2:Serous] [3:N/A] Exudate Type: [1:red, brown] [2:amber] [3:N/A] Exudate Color: [1:Epibole] [2:Flat and Intact] [3:Distinct, outline attached] Wound Margin: [1:Large (67-100%)] [2:None Present (0%)] [3:None Present (0%)] Granulation Amount: [1:Pink] [2:N/A] [3:N/A] Granulation Quality: [1:None Present (0%)] [2:Large (67-100%)] [3:None Present (0%)] Necrotic Amount: [1:N/A] [2:Adherent Slough] [3:N/A] Necrotic Tissue: [1:Fat Layer (Subcutaneous Tissue): Yes Fascia: No] [3:Fascia: No] Exposed Structures: [1:Fascia: No Tendon: No Muscle: No Joint: No Bone: No Medium (34-66%)] [2:Fat Layer  (Subcutaneous Tissue): No Tendon: No Muscle: No Joint: No Bone: No None] [3:Fat Layer (Subcutaneous Tissue): No Tendon: No Muscle: No Joint: No Bone: No Large (67-100%)] Wound Number: 4 5 N/A Photos: No Photos No Photos N/A Right [name] Great oe Right [name] Fifth oe N/A Wound Location: Not Known Not Known N/A Wounding Event: Diabetic Wound/Ulcer of the Lower Diabetic Wound/Ulcer of the Lower N/A Primary Etiology: Extremity Extremity Congestive Heart Failure, Coronary Congestive Heart Failure, Coronary N/A Comorbid History: Artery Disease, Hypertension, Type II Artery Disease, Hypertension, Type II Diabetes, Seizure Disorder Diabetes, Seizure Disorder 10/11/2020 10/11/2020 N/A Date Acquired: 0 0 N/A Weeks of Treatment: Open Open N/A Wound Status: 1.4x1x0.1 1x0.5x0.1 N/A Measurements L x W x D (cm) 1.1 0.393 N/A A (cm) : rea 0.11 0.039 N/A Volume (cm) : N/A N/A N/A % Reduction in A rea: N/A N/A N/A % Reduction in Volume: Grade 2 Grade 2 N/A Classification: Medium Medium N/A Exudate A mount: Serosanguineous Serosanguineous N/A Exudate Type: red, brown red, brown N/A Exudate Color: Flat and Intact Flat and Intact N/A Wound Margin: Small (1-33%) Small (1-33%) N/A Granulation Amount: Pink Pink N/A Granulation Quality: Large (67-100%) Large (67-100%) N/A Necrotic Amount: Adherent Slough Eschar N/A Necrotic Tissue: Fat Layer (Subcutaneous Tissue): Yes Fat Layer (Subcutaneous Tissue): Yes N/A Exposed Structures: Bone: Yes Fascia: No Fascia: No Tendon: No Tendon: No Muscle: No Muscle: No Joint: No Joint: No Bone: No None None N/A Epithelialization: Treatment Notes Electronic Signature(s) Signed: 10/18/2020 5:18:36 PM By: Linton Ham MD Signed: 10/18/2020 6:28:52 PM By: Deon Pilling Entered By: Linton Ham on 10/18/2020 14:33:16 -------------------------------------------------------------------------------- Multi-Disciplinary Care Plan Details Patient  Name: Date of Service: Luis Pence HN [name]. 10/18/2020 1:15 PM Medical Record Number: [id number] Patient Account Number: [id number] Date of Birth/Sex: Treating RN: Dec 16, 1945 (75 y.o. [name] Primary Care Provider: [name] Other Clinician: Referring Provider: Treating Provider/Extender: [name] in Treatment: 11 Active Inactive Wound/Skin Impairment Nursing Diagnoses: Impaired tissue integrity Knowledge deficit related to ulceration/compromised skin integrity Goals: Patient/caregiver will verbalize understanding of skin care regimen Date Initiated: 07/27/2020 Target Resolution Date: 11/09/2020 Goal Status: Active Ulcer/skin breakdown will have a volume reduction of 30% by week 4 Date Initiated: 07/27/2020 Date Inactivated: 09/13/2020 Target Resolution Date: 08/24/2020 Goal Status: Met Interventions: Assess patient/caregiver ability to obtain necessary supplies Assess patient/caregiver ability to perform ulcer/skin care regimen upon admission and as needed Assess ulceration(s) every visit Provide education on ulcer and skin care Treatment Activities: Skin care regimen initiated : 07/27/2020 Topical wound management initiated : 07/27/2020 Notes: Electronic Signature(s) Signed: 10/18/2020 6:28:52 PM By: Deon Pilling Entered By: Deon Pilling on 10/18/2020 18:03:01 -------------------------------------------------------------------------------- Pain Assessment Details Patient Name: Date  of Service: [name], [name] [facility] [name]. 10/18/2020 1:15 PM Medical Record Number: [id number] Patient Account Number: [id number] Date of Birth/Sex: Treating RN: 07-Jun-1946 (75 y.o. [name] Primary Care Provider: [name] Other Clinician: Referring Provider: Treating Provider/Extender: [name] in Treatment: 11 Active Problems Location of Pain Severity and Description of Pain Patient Has Paino No Site  Locations Pain Management and Medication Current Pain Management: Electronic Signature(s) Signed: 10/22/2020 5:04:19 PM By: Levan Hurst RN, BSN Entered By: Levan Hurst on 10/18/2020 13:43:44 -------------------------------------------------------------------------------- Patient/Caregiver Education Details Patient Name: Date of Service: [name] 1/6/2022andnbsp1:15 PM Medical Record Number: [id number] Patient Account Number: [id number] Date of Birth/Gender: Treating RN: 1946/10/03 (75 y.o. [name] Primary Care Physician: [name] Other Clinician: Referring Physician: Treating Physician/Extender: [name] in Treatment: 11 Education Assessment Education Provided To: Patient Education Topics Provided Wound/Skin Impairment: Handouts: Skin Care Do's and Dont's Methods: Explain/Verbal Responses: Reinforcements needed Electronic Signature(s) Signed: 10/18/2020 6:28:52 PM By: Deon Pilling Entered By: Deon Pilling on 10/18/2020 18:03:23 -------------------------------------------------------------------------------- Wound Assessment Details Patient Name: Date of Service: [name] [name]. 10/18/2020 1:15 PM Medical Record Number: [id number] Patient Account Number: [id number] Date of Birth/Sex: Treating RN: 05-May-1946 (75 y.o. [name] Primary Care Provider: [name] Other Clinician: Referring Provider: Treating Provider/Extender: [name] in Treatment: 11 Wound Status Wound Number: 1 Primary Abscess Etiology: Wound Location: Left Gluteus Wound Open Wounding Event: Gradually Appeared Status: Date Acquired: 05/30/2019 Comorbid Congestive Heart Failure, Coronary Artery Disease, Weeks Of Treatment: 11 History: Hypertension, Type II Diabetes, Seizure Disorder Clustered Wound: No Wound Measurements Length: (cm) 0.6 Width: (cm) 0.4 Depth: (cm) 0.4 Area: (cm)  0.188 Volume: (cm) 0.075 % Reduction in Area: 96.9% % Reduction in Volume: 99.4% Epithelialization: Medium (34-66%) Tunneling: No Undermining: No Wound Description Classification: Full Thickness Without Exposed Support Structures Wound Margin: Epibole Exudate Amount: Medium Exudate Type: Serosanguineous Exudate Color: red, brown Foul Odor After Cleansing: No Slough/Fibrino No Wound Bed Granulation Amount: Large (67-100%) Exposed Structure Granulation Quality: Pink Fascia Exposed: No Necrotic Amount: None Present (0%) Fat Layer (Subcutaneous Tissue) Exposed: Yes Tendon Exposed: No Muscle Exposed: No Joint Exposed: No Bone Exposed: No Treatment Notes Wound #1 (Gluteus) Wound Laterality: Left Cleanser Wound Cleanser Discharge Instruction: Cleanse the wound with wound cleanser prior to applying a clean dressing using gauze sponges, not tissue or cotton balls. Peri-Wound Care Topical Primary Dressing Promogran Prisma Matrix, 4.34 (sq in) (silver collagen) Discharge Instruction: Apply silver collagen to wound bed as instructed. BACK WITH SALINE GAUZE. Secondary Dressing Woven Gauze Sponge, Non-Sterile 4x4 in Discharge Instruction: OR BORDERED FOAM. Apply over primary dressing as directed. Secured With Compression Wrap Compression Stockings Environmental education officer) Signed: 10/22/2020 5:04:19 PM By: Levan Hurst RN, BSN Entered By: Levan Hurst on 10/18/2020 14:06:45 -------------------------------------------------------------------------------- Wound Assessment Details Patient Name: Date of Service: Luis Pence HN [name]. 10/18/2020 1:15 PM Medical Record Number: [id number] Patient Account Number: [id number] Date of Birth/Sex: Treating RN: [birth year] (75 y.o. [name] Primary Care Provider: [name] Other Clinician: Referring Provider: Treating Provider/Extender: [name] in Treatment: 11 Wound Status Wound Number:  2 Primary Diabetic Wound/Ulcer of the Lower Extremity Etiology: Wound Location: Left, Lateral [name] Fourth oe Wound Open Wounding Event: Gradually Appeared Status: Date Acquired: 08/16/2020 Comorbid Congestive Heart Failure, Coronary Artery Disease, Weeks Of Treatment: 9 History: Hypertension, Type II Diabetes, Seizure Disorder Clustered Wound: No Wound Measurements Length: (cm) 0.5 Width: (cm) 0.4 Depth: (cm) 0.2 Area: (  cm) 0.157 Volume: (cm) 0.031 % Reduction in Area: -24.6% % Reduction in Volume: -138.5% Epithelialization: None Tunneling: No Undermining: No Wound Description Classification: Grade 2 Wound Margin: Flat and Intact Exudate Amount: Small Exudate Type: Serous Exudate Color: amber Foul Odor After Cleansing: No Slough/Fibrino Yes Wound Bed Granulation Amount: None Present (0%) Exposed Structure Necrotic Amount: Large (67-100%) Fascia Exposed: No Necrotic Quality: Adherent Slough Fat Layer (Subcutaneous Tissue) Exposed: No Tendon Exposed: No Muscle Exposed: No Joint Exposed: No Bone Exposed: No Treatment Notes Wound #2 (Toe Fourth) Wound Laterality: Left, Lateral Cleanser Wound Cleanser Discharge Instruction: Cleanse the wound with wound cleanser prior to applying a clean dressing using gauze sponges, not tissue or cotton balls. Peri-Wound Care Topical Primary Dressing KerraCel Ag Gelling Fiber Dressing, 2x2 in (silver alginate) Discharge Instruction: Apply silver alginate to wound bed as instructed Secondary Dressing Woven Gauze Sponges 2x2 in Discharge Instruction: Apply over primary dressing as directed. Secured With Conforming Stretch Gauze Bandage, Sterile 2x75 (in/in) Discharge Instruction: Secure with stretch gauze as directed. Compression Wrap Compression Stockings Add-Ons Electronic Signature(s) Signed: 10/22/2020 5:04:19 PM By: Zandra Abts RN, BSN Entered By: Zandra Abts on 10/18/2020  14:06:55 -------------------------------------------------------------------------------- Wound Assessment Details Patient Name: Date of Service: Luis Jasper HN [name]. 10/18/2020 1:15 PM Medical Record Number: [id number] Patient Account Number: [id number] Date of Birth/Sex: Treating RN: 01-30-1946 (75 y.o. [name] Primary Care Provider: Mila Palmer Other Clinician: Referring Provider: Treating Provider/Extender: Francina Ames in Treatment: 11 Wound Status Wound Number: 3 Primary Pressure Ulcer Etiology: Wound Location: Right Gluteus Wound Healed - Epithelialized Wounding Event: Shear/Friction Status: Date Acquired: 09/13/2020 Comorbid Congestive Heart Failure, Coronary Artery Disease, Weeks Of Treatment: 5 History: Hypertension, Type II Diabetes, Seizure Disorder Clustered Wound: No Wound Measurements Length: (cm) Width: (cm) Depth: (cm) Area: (cm) Volume: (cm) 0 % Reduction in Area: 100% 0 % Reduction in Volume: 100% 0 Epithelialization: Large (67-100%) 0 Tunneling: No 0 Undermining: No Wound Description Classification: Category/Stage II Wound Margin: Distinct, outline attached Exudate Amount: None Present Foul Odor After Cleansing: No Slough/Fibrino No Wound Bed Granulation Amount: None Present (0%) Exposed Structure Necrotic Amount: None Present (0%) Fascia Exposed: No Fat Layer (Subcutaneous Tissue) Exposed: No Tendon Exposed: No Muscle Exposed: No Joint Exposed: No Bone Exposed: No Electronic Signature(s) Signed: 10/22/2020 5:04:19 PM By: Zandra Abts RN, BSN Entered By: Zandra Abts on 10/18/2020 14:07:25 -------------------------------------------------------------------------------- Wound Assessment Details Patient Name: Date of Service: Luis Jasper HN [name]. 10/18/2020 1:15 PM Medical Record Number: [id number] Patient Account Number: [id number] Date of Birth/Sex: Treating RN: 10/08/1946 (75 y.o. [name] Primary Care Provider: Mila Palmer Other Clinician: Referring Provider: Treating Provider/Extender: Francina Ames in Treatment: 11 Wound Status Wound Number: 4 Primary Diabetic Wound/Ulcer of the Lower Extremity Etiology: Wound Location: Right [name] Great oe Wound Open Wounding Event: Not Known Status: Date Acquired: 10/11/2020 Comorbid Congestive Heart Failure, Coronary Artery Disease, Weeks Of Treatment: 0 History: Hypertension, Type II Diabetes, Seizure Disorder Clustered Wound: No Wound Measurements Length: (cm) 1.4 Width: (cm) 1 Depth: (cm) 0.1 Area: (cm) 1.1 Volume: (cm) 0.11 % Reduction in Area: % Reduction in Volume: Epithelialization: None Tunneling: No Undermining: No Wound Description Classification: Grade 2 Wound Margin: Flat and Intact Exudate Amount: Medium Exudate Type: Serosanguineous Exudate Color: red, brown Foul Odor After Cleansing: No Slough/Fibrino Yes Wound Bed Granulation Amount: Small (1-33%) Exposed Structure Granulation Quality: Pink Fascia Exposed: No Necrotic Amount: Large (67-100%) Fat Layer (Subcutaneous Tissue) Exposed: Yes Necrotic Quality: Adherent Slough Tendon Exposed:  No Muscle Exposed: No Joint Exposed: No Bone Exposed: Yes Treatment Notes Wound #4 (Toe Great) Wound Laterality: Right Cleanser Wound Cleanser Discharge Instruction: Cleanse the wound with wound cleanser prior to applying a clean dressing using gauze sponges, not tissue or cotton balls. Peri-Wound Care Topical Primary Dressing KerraCel Ag Gelling Fiber Dressing, 2x2 in (silver alginate) Discharge Instruction: Apply silver alginate to wound bed as instructed Secondary Dressing Woven Gauze Sponges 2x2 in Discharge Instruction: Apply over primary dressing as directed. Secured With Conforming Stretch Gauze Bandage, Sterile 2x75 (in/in) Discharge Instruction: Secure with stretch gauze as directed. Compression  Wrap Compression Stockings Add-Ons Electronic Signature(s) Signed: 10/22/2020 5:04:19 PM By: Levan Hurst RN, BSN Entered By: Levan Hurst on 10/18/2020 14:04:36 -------------------------------------------------------------------------------- Wound Assessment Details Patient Name: Date of Service: Luis Pence HN [name]. 10/18/2020 1:15 PM Medical Record Number: [id number] Patient Account Number: [id number] Date of Birth/Sex: Treating RN: [birth year] (75 y.o. [name] Primary Care Provider: [name] Other Clinician: Referring Provider: Treating Provider/Extender: [name] in Treatment: 11 Wound Status Wound Number: 5 Primary Diabetic Wound/Ulcer of the Lower Extremity Etiology: Wound Location: Right [name] Fifth oe Wound Open Wounding Event: Not Known Status: Date Acquired: 10/11/2020 Comorbid Congestive Heart Failure, Coronary Artery Disease, Weeks Of Treatment: 0 History: Hypertension, Type II Diabetes, Seizure Disorder Clustered Wound: No Wound Measurements Length: (cm) 1 Width: (cm) 0.5 Depth: (cm) 0.1 Area: (cm) 0.393 Volume: (cm) 0.039 % Reduction in Area: % Reduction in Volume: Epithelialization: None Tunneling: No Undermining: No Wound Description Classification: Grade 2 Wound Margin: Flat and Intact Exudate Amount: Medium Exudate Type: Serosanguineous Exudate Color: red, brown Foul Odor After Cleansing: No Slough/Fibrino No Wound Bed Granulation Amount: Small (1-33%) Exposed Structure Granulation Quality: Pink Fascia Exposed: No Necrotic Amount: Large (67-100%) Fat Layer (Subcutaneous Tissue) Exposed: Yes Necrotic Quality: Eschar Tendon Exposed: No Muscle Exposed: No Joint Exposed: No Bone Exposed: No Treatment Notes Wound #5 (Toe Fifth) Wound Laterality: Right Cleanser Wound Cleanser Discharge Instruction: Cleanse the wound with wound cleanser prior to applying a clean dressing using gauze sponges, not  tissue or cotton balls. Peri-Wound Care Topical Primary Dressing KerraCel Ag Gelling Fiber Dressing, 2x2 in (silver alginate) Discharge Instruction: Apply silver alginate to wound bed as instructed Secondary Dressing Woven Gauze Sponges 2x2 in Discharge Instruction: Apply over primary dressing as directed. Secured With Conforming Stretch Gauze Bandage, Sterile 2x75 (in/in) Discharge Instruction: Secure with stretch gauze as directed. Compression Wrap Compression Stockings Add-Ons Electronic Signature(s) Signed: 10/22/2020 5:04:19 PM By: Levan Hurst RN, BSN Entered By: Levan Hurst on 10/18/2020 14:06:03 -------------------------------------------------------------------------------- Grant Details Patient Name: Date of Service: Luis Pence HN [name]. 10/18/2020 1:15 PM Medical Record Number: [id number] Patient Account Number: [id number] Date of Birth/Sex: Treating RN: Feb 25, 1946 (75 y.o. [name] Primary Care Provider: [name] Other Clinician: Referring Provider: Treating Provider/Extender: [name] in Treatment: 11 Vital Signs Time Taken: 13:43 Temperature (F): 97.8 Height (in): 64 Pulse (bpm): 70 Weight (lbs): 150 Respiratory Rate (breaths/min): 16 Body Mass Index (BMI): 25.7 Blood Pressure (mmHg): 94/58 Capillary Blood Glucose (mg/dl): 124 Reference Range: 80 - 120 mg / dl Notes glucose per pt report Electronic Signature(s) Signed: 10/22/2020 5:04:19 PM By: Levan Hurst RN, BSN Entered By: Levan Hurst on 10/18/2020 13:43:39

## 2020-10-23 ENCOUNTER — Telehealth: Payer: Self-pay | Admitting: Diagnostic Neuroimaging

## 2020-10-23 DIAGNOSIS — N1832 Chronic kidney disease, stage 3b: Secondary | ICD-10-CM | POA: Diagnosis not present

## 2020-10-23 DIAGNOSIS — I69354 Hemiplegia and hemiparesis following cerebral infarction affecting left non-dominant side: Secondary | ICD-10-CM | POA: Diagnosis not present

## 2020-10-23 DIAGNOSIS — E1122 Type 2 diabetes mellitus with diabetic chronic kidney disease: Secondary | ICD-10-CM | POA: Diagnosis not present

## 2020-10-23 DIAGNOSIS — Z794 Long term (current) use of insulin: Secondary | ICD-10-CM | POA: Diagnosis not present

## 2020-10-23 DIAGNOSIS — L89892 Pressure ulcer of other site, stage 2: Secondary | ICD-10-CM | POA: Diagnosis not present

## 2020-10-23 DIAGNOSIS — G20 Parkinson's disease: Secondary | ICD-10-CM | POA: Diagnosis not present

## 2020-10-23 DIAGNOSIS — I5033 Acute on chronic diastolic (congestive) heart failure: Secondary | ICD-10-CM | POA: Diagnosis not present

## 2020-10-23 DIAGNOSIS — R1310 Dysphagia, unspecified: Secondary | ICD-10-CM | POA: Diagnosis not present

## 2020-10-23 DIAGNOSIS — L89153 Pressure ulcer of sacral region, stage 3: Secondary | ICD-10-CM | POA: Diagnosis not present

## 2020-10-23 DIAGNOSIS — S90411D Abrasion, right great toe, subsequent encounter: Secondary | ICD-10-CM | POA: Diagnosis not present

## 2020-10-23 DIAGNOSIS — L89323 Pressure ulcer of left buttock, stage 3: Secondary | ICD-10-CM | POA: Diagnosis not present

## 2020-10-23 DIAGNOSIS — I130 Hypertensive heart and chronic kidney disease with heart failure and stage 1 through stage 4 chronic kidney disease, or unspecified chronic kidney disease: Secondary | ICD-10-CM | POA: Diagnosis not present

## 2020-10-23 MED ORDER — KEPPRA 500 MG PO TABS: 500.0000 mg | ORAL_TABLET | Freq: Two times a day (BID) | ORAL | 4 refills | Status: AC

## 2020-10-23 MED ORDER — CARBIDOPA-LEVODOPA 25-100 MG PO TABS: 2.0000 | ORAL_TABLET | Freq: Three times a day (TID) | ORAL | 3 refills | Status: AC

## 2020-10-23 MED ORDER — ASPIRIN-DIPYRIDAMOLE ER 25-200 MG PO CP12: 1.0000 | ORAL_CAPSULE | Freq: Two times a day (BID) | ORAL | 4 refills | Status: AC

## 2020-10-23 NOTE — Telephone Encounter (Addendum)
Noted. Called wife and apologized and advised Dr Leta Baptist will call later today. She asked to be called on home phone [phone].

## 2020-10-23 NOTE — Telephone Encounter (Signed)
I spoke with patient's wife.   - Patient now has metastatic prostate cancer to bones; on palliative care. Difficult time leaving home for appts.  - Offfered to simplify / de-escalate medication regimen, but patient and wife want to continue current treatments. - Continue carb/levo for parkinsonism. - Continue levetiracetam for seizure prevention. - Continue aggrenox for stroke prevention (could consider to change to aspirin 81mg  daily).  Meds ordered this encounter  Medications  . KEPPRA 500 MG tablet    Sig: Take 1 tablet (500 mg total) by mouth 2 (two) times daily.    Dispense:  180 tablet    Refill:  4  . dipyridamole-aspirin (AGGRENOX) 200-25 MG 12hr capsule    Sig: Take 1 capsule by mouth 2 (two) times daily.    Dispense:  180 capsule    Refill:  4  . carbidopa-levodopa (SINEMET IR) 25-100 MG tablet    Sig: Take 2 tablets by mouth 3 (three) times daily. Change: Take Two tabs 3 x daily    Dispense:  540 tablet    Refill:  [place], MD 9/70/2637, 8:58 PM Certified in Neurology, Neurophysiology and Neuroimaging  [facility] [address] [place], [place] [address] [phone]

## 2020-10-23 NOTE — Addendum Note (Signed)
Addended by: Andrey Spearman R on: 10/23/2020 05:27 PM   Modules accepted: Orders

## 2020-10-23 NOTE — Telephone Encounter (Signed)
Spoke with wife and advised her in the interest of having adequate time for Dr Leta Baptist to call patient the call needs to take place another day. She stated tomorrow patient has 2 hour procedure tomorrow afternoon. He has appointment Thurs afternoon as well. I advised will let Dr Leta Baptist know and schedule the call Thurs late morning. She verbalized understanding, appreciation.

## 2020-10-23 NOTE — Telephone Encounter (Signed)
Wife is asking for a call to discuss why they were not called for the appointment on yesterday

## 2020-10-23 NOTE — Telephone Encounter (Signed)
I thought it was virtual visit and they never checked in. I can call them later today for a phone visit. -VRP

## 2020-10-24 ENCOUNTER — Ambulatory Visit (HOSPITAL_COMMUNITY)
Admission: RE | Admit: 2020-10-24 | Discharge: 2020-10-24 | Disposition: A | Discharge status: Home / Self Care | Payer: Medicare Other | Source: Ambulatory Visit | Attending: Cardiology | Admitting: Cardiology

## 2020-10-24 ENCOUNTER — Other Ambulatory Visit: Payer: Self-pay

## 2020-10-24 DIAGNOSIS — L97519 Non-pressure chronic ulcer of other part of right foot with unspecified severity: Secondary | ICD-10-CM

## 2020-10-24 DIAGNOSIS — L97529 Non-pressure chronic ulcer of other part of left foot with unspecified severity: Secondary | ICD-10-CM | POA: Diagnosis not present

## 2020-10-24 DIAGNOSIS — I739 Peripheral vascular disease, unspecified: Secondary | ICD-10-CM | POA: Diagnosis not present

## 2020-10-25 ENCOUNTER — Encounter (HOSPITAL_BASED_OUTPATIENT_CLINIC_OR_DEPARTMENT_OTHER): Payer: Medicare Other | Admitting: Internal Medicine

## 2020-10-25 ENCOUNTER — Telehealth: Payer: Self-pay | Admitting: Cardiovascular Disease

## 2020-10-25 DIAGNOSIS — E1151 Type 2 diabetes mellitus with diabetic peripheral angiopathy without gangrene: Secondary | ICD-10-CM | POA: Diagnosis not present

## 2020-10-25 DIAGNOSIS — L97522 Non-pressure chronic ulcer of other part of left foot with fat layer exposed: Secondary | ICD-10-CM | POA: Diagnosis not present

## 2020-10-25 DIAGNOSIS — E11621 Type 2 diabetes mellitus with foot ulcer: Secondary | ICD-10-CM | POA: Diagnosis not present

## 2020-10-25 DIAGNOSIS — S31829D Unspecified open wound of left buttock, subsequent encounter: Secondary | ICD-10-CM | POA: Diagnosis not present

## 2020-10-25 DIAGNOSIS — I69954 Hemiplegia and hemiparesis following unspecified cerebrovascular disease affecting left non-dominant side: Secondary | ICD-10-CM | POA: Diagnosis not present

## 2020-10-25 DIAGNOSIS — L0231 Cutaneous abscess of buttock: Secondary | ICD-10-CM | POA: Diagnosis not present

## 2020-10-25 DIAGNOSIS — L97512 Non-pressure chronic ulcer of other part of right foot with fat layer exposed: Secondary | ICD-10-CM | POA: Diagnosis not present

## 2020-10-25 DIAGNOSIS — L97514 Non-pressure chronic ulcer of other part of right foot with necrosis of bone: Secondary | ICD-10-CM | POA: Diagnosis not present

## 2020-10-25 DIAGNOSIS — L98412 Non-pressure chronic ulcer of buttock with fat layer exposed: Secondary | ICD-10-CM | POA: Diagnosis not present

## 2020-10-25 DIAGNOSIS — L97528 Non-pressure chronic ulcer of other part of left foot with other specified severity: Secondary | ICD-10-CM | POA: Diagnosis not present

## 2020-10-25 DIAGNOSIS — C7951 Secondary malignant neoplasm of bone: Secondary | ICD-10-CM | POA: Diagnosis not present

## 2020-10-25 NOTE — Progress Notes (Signed)
[name], [name] ([id number]) . Visit Report for 10/25/2020 Arrival Information Details Patient Name: Date of Service: [name], [name] [facility] T. 10/25/2020 2:30 PM Medical Record Number: [id number] Patient Account Number: [id number] Date of Birth/Sex: Treating RN: November 25, 1945 (75 y.o. [name], [name] Primary Care [name]: [name] Other Clinician: Referring [name]: Treating Marely Apgar/Extender: [name] in Treatment: 12 Visit Information History Since Last Visit Added or deleted any medications: No Patient Arrived: Wheel Chair Any new allergies or adverse reactions: No Arrival Time: 14:45 Had a fall or experienced change in No Accompanied By: wife activities of daily living that may affect Transfer Assistance: Transfer Board risk of falls: Patient Identification Verified: Yes Signs or symptoms of abuse/neglect since last visito No Secondary Verification Process Completed: Yes Hospitalized since last visit: No Patient Requires Transmission-Based Precautions: No Implantable device outside of the clinic excluding No Patient Has Alerts: Yes cellular tissue based products placed in the center Patient Alerts: Patient on Blood Thinner since last visit: Pain Present Now: No Electronic Signature(s) Signed: 10/25/2020 4:03:30 PM By: Mikeal Hawthorne EMT/HBOT/SD Entered By: Mikeal Hawthorne on 10/25/2020 15:01:18 -------------------------------------------------------------------------------- Clinic Level of Care Assessment Details Patient Name: Date of Service: [name], [name] [facility] T. 10/25/2020 2:30 PM Medical Record Number: [id number] Patient Account Number: [id number] Date of Birth/Sex: Treating RN: [birth year] (75 y.o. [name], [name] Primary Care [name]: [name] Other Clinician: Referring Serrena Linderman: Treating [name]/Extender: [name] in Treatment: 12 Clinic Level of Care Assessment Items TOOL 4 Quantity Score X- 1  0 Use when only an EandM is performed on FOLLOW-UP visit ASSESSMENTS - Nursing Assessment / Reassessment X- 1 10 Reassessment of Co-morbidities (includes updates in patient status) X- 1 5 Reassessment of Adherence to Treatment Plan ASSESSMENTS - Wound and Skin A ssessment / Reassessment _0  - 0 Simple Wound Assessment / Reassessment - one wound X- 4 5 Complex Wound Assessment / Reassessment - multiple wounds X- 1 10 Dermatologic / Skin Assessment (not related to wound area) ASSESSMENTS - Focused Assessment X- 2 5 Circumferential Edema Measurements - multi extremities X- 1 10 Nutritional Assessment / Counseling / Intervention _1  - 0 Lower Extremity Assessment (monofilament, tuning fork, pulses) _2  - 0 Peripheral Arterial Disease Assessment (using hand held doppler) ASSESSMENTS - Ostomy and/or Continence Assessment and Care _3  - 0 Incontinence Assessment and Management _4  - 0 Ostomy Care Assessment and Management (repouching, etc.) PROCESS - Coordination of Care _5  - 0 Simple Patient / Family Education for ongoing care X- 1 20 Complex (extensive) Patient / Family Education for ongoing care X- 1 10 Staff obtains Programmer, systems, Records, T Results / Process Orders est X- 1 10 Staff telephones HHA, Nursing Homes / Clarify orders / etc _6  - 0 Routine Transfer to another Facility (non-emergent condition) _7  - 0 Routine Hospital Admission (non-emergent condition) _8  - 0 New Admissions / Biomedical engineer / Ordering NPWT Apligraf, etc. , _9  - 0 Emergency Hospital Admission (emergent condition) _10  - 0 Simple Discharge Coordination X- 1 15 Complex (extensive) Discharge Coordination PROCESS - Special Needs _11  - 0 Pediatric / Minor Patient Management _12  - 0 Isolation Patient Management _13  - 0 Hearing / Language / Visual special needs _14  - 0 Assessment of Community assistance (transportation, D/C planning, etc.) _15  - 0 Additional assistance / Altered mentation _16  -  0 Support Surface(s) Assessment (bed, cushion, seat, etc.) INTERVENTIONS - Wound Cleansing / Measurement _17  - 0 Simple Wound Cleansing - one wound X- 4 5 Complex Wound Cleansing - multiple wounds X- 1  5 Wound Imaging (photographs - any number of wounds) _0  - 0 Wound Tracing (instead of photographs) _1  - 0 Simple Wound Measurement - one wound X- 4 5 Complex Wound Measurement - multiple wounds INTERVENTIONS - Wound Dressings X - Small Wound Dressing one or multiple wounds 3 10 _2  - 0 Medium Wound Dressing one or multiple wounds _3  - 0 Large Wound Dressing one or multiple wounds <VZCHYIFOYDXAJOIN>_8<\/MVEHMCNOBSJGGEZM>_6  - 0 Application of Medications - topical <QHUTMLYYTKPTWSFK>_8<\/LEXNTZGYFVCBSWHQ>_7  - 0 Application of Medications - injection INTERVENTIONS - Miscellaneous _6  - 0 External ear exam _7  - 0 Specimen Collection (cultures, biopsies, blood, body fluids, etc.) _8  - 0 Specimen(s) / Culture(s) sent or taken to Lab for analysis _9  - 0 Patient Transfer (multiple staff / Civil Service fast streamer / Similar devices) _10  - 0 Simple Staple / Suture removal (25 or less) _11  - 0 Complex Staple / Suture removal (26 or more) _12  - 0 Hypo / Hyperglycemic Management (close monitor of Blood Glucose) _13  - 0 Ankle / Brachial Index (ABI) - do not check if billed separately X- 1 5 Vital Signs Has the patient been seen at the hospital within the last three years: Yes Total Score: 200 Level Of Care: New/Established - Level 5 Electronic Signature(s) Signed: 10/25/2020 5:39:22 PM By: Deon Pilling Entered By: Deon Pilling on 10/25/2020 16:43:57 -------------------------------------------------------------------------------- Lower Extremity Assessment Details Patient Name: Date of Service: [name], [name] [facility] T. 10/25/2020 2:30 PM Medical Record Number: [id number] Patient Account Number: [id number] Date of Birth/Sex: Treating RN: 1946-09-24 (75 y.o. [name] Primary Care Dominyck Reser: [name] Other Clinician: Referring Jerri Glauser: Treating Delorese Sellin/Extender: [name] in Treatment: 12 Electronic Signature(s) Signed: 10/25/2020 4:03:30 PM By: Mikeal Hawthorne EMT/HBOT/SD Signed: 10/25/2020 5:39:22 PM By: Deon Pilling Entered By: Mikeal Hawthorne on 10/25/2020 15:03:17 -------------------------------------------------------------------------------- Multi Wound Chart Details Patient Name: Date of Service: [name] T. 10/25/2020 2:30 PM Medical Record Number: [id number] Patient Account Number: [id number] Date of Birth/Sex: Treating RN: May 12, 1946 (75 y.o. [name], [name] Primary Care Deundre Thong: [name] Other Clinician: Referring Oluwakemi Salsberry: Treating Ludene Stokke/Extender: [name] in Treatment: 12 Vital Signs Height(in): 38 Pulse(bpm): 32 Weight(lbs): 150 Blood Pressure(mmHg): 101/61 Body Mass Index(BMI): 26 Temperature(F): 97.6 Respiratory Rate(breaths/min): 15 Photos: [1:No Photos Left Gluteus] [2:No Photos Left, Lateral T Fourth oe] [4:No Photos Right T Great oe] Wound Location: [1:Gradually Appeared] [2:Gradually Appeared] [4:Not Known] Wounding Event: [1:Abscess] [2:Diabetic Wound/Ulcer of the Lower] [4:Diabetic Wound/Ulcer of the Lower] Primary Etiology: [1:Congestive Heart Failure, Coronary Congestive Heart Failure, Coronary] [2:Extremity] [4:Extremity Congestive Heart Failure, Coronary] Comorbid History: [1:Artery Disease, Hypertension, Type II Artery Disease, Hypertension, Type II Diabetes, Seizure Disorder 05/30/2019] [2:Diabetes, Seizure Disorder 08/16/2020] [4:Artery Disease, Hypertension, Type II Diabetes, Seizure Disorder  10/11/2020] Date Acquired: [1:12] [2:10] [4:1] Weeks of Treatment: [1:Healed - Epithelialized] [2:Open] [4:Open] Wound Status: [1:0x0x0] [2:0.4x0.3x0.2] [4:0.8x0.7x0.1] Measurements L x W x D (cm) [1:0] [2:0.094] [4:0.44] A (cm) : rea [1:0] [2:0.019] [4:0.044] Volume (cm) : [1:100.00%] [2:25.40%] [4:60.00%] % Reduction in Area: [1:100.00%] [2:-46.20%]  [4:60.00%] % Reduction in Volume: [1:Full Thickness Without Exposed] [2:Grade 2] [4:Grade 2] Classification: [1:Support Structures Medium] [2:Small] [4:Medium] Exudate A mount: [1:Serosanguineous] [2:Serous] [4:Serosanguineous] Exudate Type: [1:red, brown] [2:amber] [4:red, brown] Exudate Color: [1:Epibole] [2:Flat and Intact] [4:Flat and Intact] Wound Margin: [1:Large (67-100%)] [2:None Present (0%)] [4:Small (1-33%)] Granulation Amount: [1:Pink] [2:N/A] [4:Pink] Granulation Quality: [1:None Present (0%)] [2:Medium (34-66%)] [4:Large (67-100%)] Necrotic Amount: [1:N/A] [2:Adherent Slough] [4:Adherent Slough] Necrotic Tissue: [1:Fat Layer (Subcutaneous Tissue): Yes Fascia: No] [4:Fat Layer (Subcutaneous Tissue): Yes] Exposed  Structures: [1:Fascia: No Tendon: No Muscle: No Joint: No Bone: No Medium (34-66%)] [2:Fat Layer (Subcutaneous Tissue): No Tendon: No Muscle: No Joint: No Bone: No None] [4:Bone: Yes Fascia: No Tendon: No Muscle: No Joint: No None] Wound Number: 5 N/A N/A Photos: No Photos N/A N/A Right T Fifth oe N/A N/A Wound Location: Not Known N/A N/A Wounding Event: Diabetic Wound/Ulcer of the Lower N/A N/A Primary Etiology: Extremity Congestive Heart Failure, Coronary N/A N/A Comorbid History: Artery Disease, Hypertension, Type II Diabetes, Seizure Disorder 10/11/2020 N/A N/A Date Acquired: 1 N/A N/A Weeks of Treatment: Open N/A N/A Wound Status: 0.9x0.4x0.1 N/A N/A Measurements L x W x D (cm) 0.283 N/A N/A A (cm) : rea 0.028 N/A N/A Volume (cm) : 28.00% N/A N/A % Reduction in A rea: 28.20% N/A N/A % Reduction in Volume: Grade 2 N/A N/A Classification: Medium N/A N/A Exudate A mount: Serosanguineous N/A N/A Exudate Type: red, brown N/A N/A Exudate Color: Flat and Intact N/A N/A Wound Margin: Small (1-33%) N/A N/A Granulation A mount: Pink N/A N/A Granulation Quality: Large (67-100%) N/A N/A Necrotic A mount: Eschar N/A N/A Necrotic  Tissue: Fat Layer (Subcutaneous Tissue): Yes N/A N/A Exposed Structures: Fascia: No Tendon: No Muscle: No Joint: No Bone: No None N/A N/A Epithelialization: Treatment Notes Electronic Signature(s) Signed: 10/25/2020 5:01:02 PM By: Linton Ham MD Signed: 10/25/2020 5:39:22 PM By: Deon Pilling Entered By: Linton Ham on 10/25/2020 16:45:34 -------------------------------------------------------------------------------- Multi-Disciplinary Care Plan Details Patient Name: Date of Service: [name] T. 10/25/2020 2:30 PM Medical Record Number: [id number] Patient Account Number: [id number] Date of Birth/Sex: Treating RN: [birth year] (75 y.o. [name] Primary Care Elliot Simoneaux: [name] Other Clinician: Referring Lailany Enoch: Treating Gaelyn Tukes/Extender: [name] in Treatment: 12 Active Inactive Wound/Skin Impairment Nursing Diagnoses: Impaired tissue integrity Knowledge deficit related to ulceration/compromised skin integrity Goals: Patient/caregiver will verbalize understanding of skin care regimen Date Initiated: 07/27/2020 Target Resolution Date: 12/07/2020 Goal Status: Active Ulcer/skin breakdown will have a volume reduction of 30% by week 4 Date Initiated: 07/27/2020 Date Inactivated: 09/13/2020 Target Resolution Date: 08/24/2020 Goal Status: Met Interventions: Assess patient/caregiver ability to obtain necessary supplies Assess patient/caregiver ability to perform ulcer/skin care regimen upon admission and as needed Assess ulceration(s) every visit Provide education on ulcer and skin care Treatment Activities: Skin care regimen initiated : 07/27/2020 Topical wound management initiated : 07/27/2020 Notes: Electronic Signature(s) Signed: 10/25/2020 5:39:22 PM By: Deon Pilling Entered By: Deon Pilling on 10/25/2020 14:31:15 -------------------------------------------------------------------------------- Pain Assessment  Details Patient Name: Date of Service: Alycia Patten T. 10/25/2020 2:30 PM Medical Record Number: [id number] Patient Account Number: [id number] Date of Birth/Sex: Treating RN: Aug 10, 1946 (75 y.o. [name] Primary Care Syd Newsome: [name] Other Clinician: Referring Brynna Dobos: Treating Priyansh Pry/Extender: [name] in Treatment: 12 Active Problems Location of Pain Severity and Description of Pain Patient Has Paino No Site Locations With Dressing Change: No Pain Management and Medication Current Pain Management: Electronic Signature(s) Signed: 10/25/2020 4:03:30 PM By: Mikeal Hawthorne EMT/HBOT/SD Signed: 10/25/2020 5:39:22 PM By: Deon Pilling Entered By: Mikeal Hawthorne on 10/25/2020 15:02:10 -------------------------------------------------------------------------------- Patient/Caregiver Education Details Patient Name: Date of Service: Alycia Patten T. 1/13/2022andnbsp2:30 PM Medical Record Number: [id number] Patient Account Number: [id number] Date of Birth/Gender: Treating RN: 03-17-1946 (75 y.o. [name] Primary Care Physician: [name] Other Clinician: Referring Physician: Treating Physician/Extender: [name] in Treatment: 12 Education Assessment Education Provided To: Patient Education Topics Provided Wound/Skin Impairment: Handouts: Skin Care Do's and Dont's Methods:  Explain/Verbal Responses: Reinforcements needed Electronic Signature(s) Signed: 10/25/2020 5:39:22 PM By: Deon Pilling Entered By: Deon Pilling on 10/25/2020 14:31:41 -------------------------------------------------------------------------------- Wound Assessment Details Patient Name: Date of Service: CURLEE, BOGAN [facility] T. 10/25/2020 2:30 PM Medical Record Number: [id number] Patient Account Number: [id number] Date of Birth/Sex: Treating RN: 11/05/1945 (75 y.o. [name], [name] Primary Care Woodie Trusty: [name] Other Clinician: Referring Shenandoah Yeats: Treating Trenton Passow/Extender: [name] in Treatment: 12 Wound Status Wound Number: 1 Primary Abscess Etiology: Wound Location: Left Gluteus Wound Healed - Epithelialized Wounding Event: Gradually Appeared Status: Date Acquired: 05/30/2019 Comorbid Congestive Heart Failure, Coronary Artery Disease, Weeks Of Treatment: 12 History: Hypertension, Type II Diabetes, Seizure Disorder Clustered Wound: No Wound Measurements Length: (cm) Width: (cm) Depth: (cm) Area: (cm) Volume: (cm) 0 % Reduction in Area: 100% 0 % Reduction in Volume: 100% 0 Epithelialization: Medium (34-66%) 0 Tunneling: No 0 Undermining: No Wound Description Classification: Full Thickness Without Exposed Support Structures Wound Margin: Epibole Exudate Amount: Medium Exudate Type: Serosanguineous Exudate Color: red, brown Foul Odor After Cleansing: No Slough/Fibrino No Wound Bed Granulation Amount: Large (67-100%) Exposed Structure Granulation Quality: Pink Fascia Exposed: No Necrotic Amount: None Present (0%) Fat Layer (Subcutaneous Tissue) Exposed: Yes Tendon Exposed: No Muscle Exposed: No Joint Exposed: No Bone Exposed: No Electronic Signature(s) Signed: 10/25/2020 5:39:22 PM By: Deon Pilling Entered By: Deon Pilling on 10/25/2020 15:16:49 -------------------------------------------------------------------------------- Wound Assessment Details Patient Name: Date of Service: [name] T. 10/25/2020 2:30 PM Medical Record Number: [id number] Patient Account Number: [id number] Date of Birth/Sex: Treating RN: 05-02-1946 (75 y.o. [name], [name] Primary Care Janel Beane: [name] Other Clinician: Referring Jameah Rouser: Treating Daelyn Pettaway/Extender: [name] in Treatment: 12 Wound Status Wound Number: 2 Primary Diabetic Wound/Ulcer of the Lower Extremity Etiology: Wound Location: Left,  Lateral T Fourth oe Wound Open Wounding Event: Gradually Appeared Status: Date Acquired: 08/16/2020 Comorbid Congestive Heart Failure, Coronary Artery Disease, Weeks Of Treatment: 10 History: Hypertension, Type II Diabetes, Seizure Disorder Clustered Wound: No Wound Measurements Length: (cm) 0.4 Width: (cm) 0.3 Depth: (cm) 0.2 Area: (cm) 0.094 Volume: (cm) 0.019 % Reduction in Area: 25.4% % Reduction in Volume: -46.2% Epithelialization: None Tunneling: No Undermining: No Wound Description Classification: Grade 2 Wound Margin: Flat and Intact Exudate Amount: Small Exudate Type: Serous Exudate Color: amber Foul Odor After Cleansing: No Slough/Fibrino Yes Wound Bed Granulation Amount: None Present (0%) Exposed Structure Necrotic Amount: Medium (34-66%) Fascia Exposed: No Necrotic Quality: Adherent Slough Fat Layer (Subcutaneous Tissue) Exposed: No Tendon Exposed: No Muscle Exposed: No Joint Exposed: No Bone Exposed: No Electronic Signature(s) Signed: 10/25/2020 4:03:30 PM By: Mikeal Hawthorne EMT/HBOT/SD Signed: 10/25/2020 5:39:22 PM By: Deon Pilling Entered By: Mikeal Hawthorne on 10/25/2020 15:04:53 -------------------------------------------------------------------------------- Wound Assessment Details Patient Name: Date of Service: [name] T. 10/25/2020 2:30 PM Medical Record Number: [id number] Patient Account Number: [id number] Date of Birth/Sex: Treating RN: September 16, 1946 (75 y.o. [name], [name] Primary Care Shakim Faith: [name] Other Clinician: Referring Bowdy Bair: Treating Crystol Walpole/Extender: [name] in Treatment: 12 Wound Status Wound Number: 4 Primary Diabetic Wound/Ulcer of the Lower Extremity Etiology: Wound Location: Right T Great oe Wound Open Wounding Event: Not Known Status: Date Acquired: 10/11/2020 Comorbid Congestive Heart Failure, Coronary Artery Disease, Weeks Of Treatment: 1 History: Hypertension,  Type II Diabetes, Seizure Disorder Clustered Wound: No Wound Measurements Length: (cm) 0.8 Width: (cm) 0.7 Depth: (cm) 0.1 Area: (cm) 0.44 Volume: (cm) 0.044 % Reduction in Area: 60% % Reduction in Volume: 60% Epithelialization: None Tunneling: No Undermining:  No Wound Description Classification: Grade 2 Wound Margin: Flat and Intact Exudate Amount: Medium Exudate Type: Serosanguineous Exudate Color: red, brown Foul Odor After Cleansing: No Slough/Fibrino Yes Wound Bed Granulation Amount: Small (1-33%) Exposed Structure Granulation Quality: Pink Fascia Exposed: No Necrotic Amount: Large (67-100%) Fat Layer (Subcutaneous Tissue) Exposed: Yes Necrotic Quality: Adherent Slough Tendon Exposed: No Muscle Exposed: No Joint Exposed: No Bone Exposed: Yes Electronic Signature(s) Signed: 10/25/2020 4:03:30 PM By: Mikeal Hawthorne EMT/HBOT/SD Signed: 10/25/2020 5:39:22 PM By: Deon Pilling Entered By: Mikeal Hawthorne on 10/25/2020 15:05:28 -------------------------------------------------------------------------------- Wound Assessment Details Patient Name: Date of Service: [name] T. 10/25/2020 2:30 PM Medical Record Number: [id number] Patient Account Number: [id number] Date of Birth/Sex: Treating RN: 01/04/1946 (75 y.o. [name], [name] Primary Care Roan Miklos: [name] Other Clinician: Referring Lavanda Nevels: Treating Willis Holquin/Extender: [name] in Treatment: 12 Wound Status Wound Number: 5 Primary Diabetic Wound/Ulcer of the Lower Extremity Etiology: Wound Location: Right T Fifth oe Wound Open Wounding Event: Not Known Status: Date Acquired: 10/11/2020 Comorbid Congestive Heart Failure, Coronary Artery Disease, Weeks Of Treatment: 1 Weeks Of Treatment: 1 History: Hypertension, Type II Diabetes, Seizure Disorder Clustered Wound: No Wound Measurements Length: (cm) 0.9 Width: (cm) 0.4 Depth: (cm) 0.1 Area: (cm) 0.283 Volume:  (cm) 0.028 % Reduction in Area: 28% % Reduction in Volume: 28.2% Epithelialization: None Tunneling: No Undermining: No Wound Description Classification: Grade 2 Wound Margin: Flat and Intact Exudate Amount: Medium Exudate Type: Serosanguineous Exudate Color: red, brown Foul Odor After Cleansing: No Slough/Fibrino No Wound Bed Granulation Amount: Small (1-33%) Exposed Structure Granulation Quality: Pink Fascia Exposed: No Necrotic Amount: Large (67-100%) Fat Layer (Subcutaneous Tissue) Exposed: Yes Necrotic Quality: Eschar Tendon Exposed: No Muscle Exposed: No Joint Exposed: No Bone Exposed: No Electronic Signature(s) Signed: 10/25/2020 4:03:30 PM By: Mikeal Hawthorne EMT/HBOT/SD Signed: 10/25/2020 5:39:22 PM By: Deon Pilling Entered By: Mikeal Hawthorne on 10/25/2020 15:06:15 -------------------------------------------------------------------------------- Vitals Details Patient Name: Date of Service: [name] T. 10/25/2020 2:30 PM Medical Record Number: [id number] Patient Account Number: [id number] Date of Birth/Sex: Treating RN: [birth year] (75 y.o. [name] Primary Care Eytan Carrigan: [name] Other Clinician: Referring Katira Dumais: Treating Nataliyah Packham/Extender: [name] in Treatment: 12 Vital Signs Time Taken: 14:55 Temperature (F): 97.6 Height (in): 64 Pulse (bpm): 73 Weight (lbs): 150 Respiratory Rate (breaths/min): 15 Body Mass Index (BMI): 25.7 Blood Pressure (mmHg): 101/61 Reference Range: 80 - 120 mg / dl Electronic Signature(s) Signed: 10/25/2020 4:03:30 PM By: Mikeal Hawthorne EMT/HBOT/SD Entered By: Mikeal Hawthorne on 10/25/2020 15:01:38

## 2020-10-25 NOTE — Telephone Encounter (Signed)
Spoke with wife regarding appointment scheduled Friday 11/09/20 at 10:00 am with Dr. Gwenlyn Found.  She voiced her understanding.

## 2020-10-25 NOTE — Progress Notes (Signed)
[name], [name] ([id number]) . Visit Report for 10/25/2020 HPI Details Patient Name: Date of Service: [name], Luis [facility] [name]. 10/25/2020 2:30 PM Medical Record Number: [id number] Patient Account Number: [id number] Date of Birth/Sex: Treating RN: [birth year] (75 y.o. [name] Primary Care Provider: [name] Other Clinician: Referring Provider: Treating Provider/Extender: [name] in Treatment: 12 History of Present Illness HPI Description: ADMISSION 07/27/2020 This is a 75 year old man who is accompanied by his wife (ex). Nevertheless she is his active caregiver. His most complicated features are remote CVA that left him with a left hemiparesis. He also has widely metastatic prostate cancer to bone which I think is contributed to increasing frailty this year and he is no longer ambulatory. He required hospitalization from 9/7 through 9/14 with respiratory failure seizures. During this time it was noted that he had an open area in the left buttock in close proximity to the gluteal cleft. His wife tells me that he has a history of perirectal abscesses. And she describes this area as starting as a painful swelling in late August certainly sounds like an abscess. When he is in the hospital he required bedside IandD's but did not go to the OR. Has CT scan of the area showed soft tissue thickening in the perianal ulcer. At that point it was concerning for a fistula connection however a discrete abscess was not seen. Notable that he was hypoalbuminemic in the hospital but actually came out better with an albumin of 3.1 on 9/14 his wife says he is eating well. They are currently treating this with Santyl ointment and a backing wet-to-dry dressing. By description of his wife this is done quite nicely and there is certainly less debris over the wound surface. They already have a hospital bed with a level 3 pressure relief surface. The patient lies on his back when he  is in bed especially at night although his wife is counseled him not to do this. They are starting sliding board transfers into his wheelchair I think with physical therapy. He has a Foley catheter in place Past medical history includes remote CVA, metastatic prostate cancer to large areas of his thoracic and lumbar spine and pelvis., Bilateral carotid artery stenosis, Parkinson's disease, type 2 diabetes, diastolic heart failure, CHF, hypertension and a recently discovered possible pancreatic mass in the head of the pancreas 08/16/2020 patient I admitted to the clinic 2-1/2 weeks ago. He had an abscess site on his left buttock that required an IandD while he was in the hospital in September. He has been using Santyl backing wet-to-dry. He is cared for her aerobically at home by his wife 2 concerning areas on his feet. A stage I pressure injury on the right heel this does not have an open wound. He also has on the lateral aspect of the left fourth toe a dry circular eschared wound. 12/2; monthly follow-up. Is a very disabled man who had an abscess in his left buttock and required an IandD. We have been using Santyl to this area I changed to silver collagen last time he is here and this actually looks quite a bit better The last time he was here his wife showed me an area on the left lateral fourth toe I think a pressure injury with his fifth toe. Been using Santyl here as well but not making much improvement As well she shows me he has an area on his right buttock which is very tiny pinpoint area but under illumination still  obviously an open area. I wonder if this is more of a shear injury and transferring and a pressure injury per se 12/16; this is a very disabled man who has a left buttock area that required an IandD apparently an abscess. Since he has come here that he developed a area on the right buttock. His wife says that this is draining pus. Finally he has an area on the lateral part of the  left fourth toe. We have been using silver collagen on the buttock and Iodoflex on the fourth toe 1/6; this is a disabled man we have been working on a left buttock area that was initially an IandD abscess injury. We have got this down to a small area in fact it appears to be epithelializing towards the base of the wound. When he was here last time he had a small draining area on the right buttock. I thought this might be a small cyst culture of the drainage was negative. I do not think this was an abscess at all While he has been here he has been developing areas on the toes on his left foot. He initially had one on the left fourth toe and then last time the right first toe. He is a type II diabetic. With he has a history of coronary artery disease. He complains of a lot of pain in the right foot 1/13; we have been working on areas on his left buttock and then on the right buttock. Both of these are healed today albeit the area on the left has some depth with skin is gone into a divot. Nevertheless I think this is a reasonable outcome. He arrived a week ago with ischemic looking wounds on his right fifth, right first and his left fourth toe. He went on to have arterial studies which we received today these showed an ABI that was noncompressible on both sides however the great toe on the right had a TBI of 0.14 with a pressure of only 17. Monophasic waveforms on all the tibial vessels and including the popliteal on the left he had monophasic waveforms again noncompressible vessels and no recorded toe pressure. Clearly these are ischemic wounds. Fortunately he is not complaining of a lot of pain The patient has a complicated medical situation which includes widely metastatic prostate cancer to bone for which she is on palliative care he also has Parkinson's disease. I have a recent note from his neurologist at which time he asked whether they wish to de-escalate the Parkinson's regimen and the  answer was no. The patient has a follow-up appointment with Dr. Gwenlyn Found on 1/28. I believe he is the patient's cardiologist but this appointment was made because of the vascular studies I think predominantly through Dr. Kennon Holter office. Electronic Signature(s) Signed: 10/25/2020 5:01:02 PM By: Linton Ham MD Entered By: Linton Ham on 10/25/2020 16:52:47 -------------------------------------------------------------------------------- Physical Exam Details Patient Name: Date of Service: Luis Pence [facility] [name]. 10/25/2020 2:30 PM Medical Record Number: [id number] Patient Account Number: [id number] Date of Birth/Sex: Treating RN: November 15, 1945 (75 y.o. [name] Primary Care Provider: [name] Other Clinician: Referring Provider: Treating Provider/Extender: [name] in Treatment: 12 Constitutional Sitting or standing Blood Pressure is within target range for patient.. Pulse regular and within target range for patient.Marland Kitchen Respirations regular, non-labored and within target range.. Temperature is normal and within the target range for the patient.Marland Kitchen Appears in no distress. Notes Wound exam The area on the right buttock is closed albeit epithelializing  into a small divot. There is nothing open on the left buttock He continues to have significant ischemic wounds on the right first toe, right fifth toe, lateral aspect of the left fourth toe. The left fifth toe and the right fourth toe also looks somewhat dusky and ischemic Electronic Signature(s) Signed: 10/25/2020 5:01:02 PM By: Linton Ham MD Entered By: Linton Ham on 10/25/2020 16:53:55 -------------------------------------------------------------------------------- Physician Orders Details Patient Name: Date of Service: Luis Pence HN [name]. 10/25/2020 2:30 PM Medical Record Number: [id number] Patient Account Number: [id number] Date of Birth/Sex: Treating RN: May 16, 1946 (75 y.o. [name],  [name] Primary Care Provider: [name] Other Clinician: Referring Provider: Treating Provider/Extender: [name] in Treatment: 12 Verbal / Phone Orders: No Diagnosis Coding ICD-10 Coding Code Description L02.31 Cutaneous abscess of buttock S31.829D Unspecified open wound of left buttock, subsequent encounter L97.528 Non-pressure chronic ulcer of other part of left foot with other specified severity L97.514 Non-pressure chronic ulcer of other part of right foot with necrosis of bone E11.51 Type 2 diabetes mellitus with diabetic peripheral angiopathy without gangrene Follow-up Appointments Return appointment in 1 month. Off-Loading Low air-loss mattress (Group 2) - CONTINUE TO USE. Turn and reposition every 2 hours Other: - CONTINUE TO USE BUNNY BOOTS FOR HEEL PROTECTION. UP IN WHEELCHAIR NO MORE THAT 2 HOUR INCREMENTS. Ensure to relieve pressure off buttock closed areas to prevent reopening. Home Health No change in wound care orders this week; continue Home Health for wound care. May utilize formulary equivalent dressing for wound treatment orders unless otherwise specified. - [facility] weekly. Wound Treatment Wound #2 - [name] Fourth oe Wound Laterality: Left, Lateral Cleanser: Wound Cleanser (Home Health) Every Other Day/30 Days Discharge Instructions: Cleanse the wound with wound cleanser prior to applying a clean dressing using gauze sponges, not tissue or cotton balls. Prim Dressing: KerraCel Ag Gelling Fiber Dressing, 2x2 in (silver alginate) (Home Health) Every Other Day/30 Days ary Discharge Instructions: Apply silver alginate to wound bed as instructed Secondary Dressing: Woven Gauze Sponges 2x2 in [facility]) Every Other Day/30 Days Discharge Instructions: Apply over primary dressing as directed. Secured With: Child psychotherapist, Sterile 2x75 (in/in) (Home Health) Every Other Day/30 Days Discharge Instructions:  Secure with stretch gauze as directed. Wound #4 - [name] Great oe Wound Laterality: Right Cleanser: Wound Cleanser (Home Health) Every Other Day/30 Days Discharge Instructions: Cleanse the wound with wound cleanser prior to applying a clean dressing using gauze sponges, not tissue or cotton balls. Prim Dressing: KerraCel Ag Gelling Fiber Dressing, 2x2 in (silver alginate) (Home Health) Every Other Day/30 Days ary Discharge Instructions: Apply silver alginate to wound bed as instructed Secondary Dressing: Woven Gauze Sponges 2x2 in [facility]) Every Other Day/30 Days Discharge Instructions: Apply over primary dressing as directed. Secured With: Child psychotherapist, Sterile 2x75 (in/in) (Home Health) Every Other Day/30 Days Discharge Instructions: Secure with stretch gauze as directed. Wound #5 - [name] Fifth oe Wound Laterality: Right Cleanser: Wound Cleanser (Home Health) Every Other Day/30 Days Discharge Instructions: Cleanse the wound with wound cleanser prior to applying a clean dressing using gauze sponges, not tissue or cotton balls. Prim Dressing: KerraCel Ag Gelling Fiber Dressing, 2x2 in (silver alginate) (Home Health) Every Other Day/30 Days ary Discharge Instructions: Apply silver alginate to wound bed as instructed Secondary Dressing: Woven Gauze Sponges 2x2 in [facility]) Every Other Day/30 Days Discharge Instructions: Apply over primary dressing as directed. Secured With: Child psychotherapist, Sterile 2x75 (in/in) (Home Health) Every  Other Day/30 Days Discharge Instructions: Secure with stretch gauze as directed. Electronic Signature(s) Signed: 10/25/2020 5:01:02 PM By: Linton Ham MD Signed: 10/25/2020 5:39:22 PM By: Deon Pilling Entered By: Deon Pilling on 10/25/2020 15:27:01 -------------------------------------------------------------------------------- Problem List Details Patient Name: Date of Service: Luis Pence HN [name]. 10/25/2020 2:30  PM Medical Record Number: [id number] Patient Account Number: [id number] Date of Birth/Sex: Treating RN: 11-23-1945 (75 y.o. [name] Primary Care Provider: [name] Other Clinician: Referring Provider: Treating Provider/Extender: [name] in Treatment: 12 Active Problems ICD-10 Encounter Code Description Active Date MDM Diagnosis L02.31 Cutaneous abscess of buttock 07/27/2020 No Yes S31.829D Unspecified open wound of left buttock, subsequent encounter 07/27/2020 No Yes L97.528 Non-pressure chronic ulcer of other part of left foot with other specified 08/16/2020 No Yes severity L97.514 Non-pressure chronic ulcer of other part of right foot with necrosis of bone 10/18/2020 No Yes E11.51 Type 2 diabetes mellitus with diabetic peripheral angiopathy without gangrene 10/18/2020 No Yes Inactive Problems ICD-10 Code Description Active Date Inactive Date L89.611 Pressure ulcer of right heel, stage 1 08/16/2020 08/16/2020 L89.312 Pressure ulcer of right buttock, stage 2 09/13/2020 09/13/2020 Resolved Problems Electronic Signature(s) Signed: 10/25/2020 5:01:02 PM By: Linton Ham MD Entered By: Linton Ham on 10/25/2020 16:45:00 -------------------------------------------------------------------------------- Progress Note Details Patient Name: Date of Service: Luis Pence HN [name]. 10/25/2020 2:30 PM Medical Record Number: [id number] Patient Account Number: [id number] Date of Birth/Sex: Treating RN: [birth year] (75 y.o. [name] Primary Care Provider: [name] Other Clinician: Referring Provider: Treating Provider/Extender: [name] in Treatment: 12 Subjective History of Present Illness (HPI) ADMISSION 07/27/2020 This is a 75 year old man who is accompanied by his wife (ex). Nevertheless she is his active caregiver. His most complicated features are remote CVA that left him with a left hemiparesis. He  also has widely metastatic prostate cancer to bone which I think is contributed to increasing frailty this year and he is no longer ambulatory. He required hospitalization from 9/7 through 9/14 with respiratory failure seizures. During this time it was noted that he had an open area in the left buttock in close proximity to the gluteal cleft. His wife tells me that he has a history of perirectal abscesses. And she describes this area as starting as a painful swelling in late August certainly sounds like an abscess. When he is in the hospital he required bedside IandD's but did not go to the OR. Has CT scan of the area showed soft tissue thickening in the perianal ulcer. At that point it was concerning for a fistula connection however a discrete abscess was not seen. Notable that he was hypoalbuminemic in the hospital but actually came out better with an albumin of 3.1 on 9/14 his wife says he is eating well. They are currently treating this with Santyl ointment and a backing wet-to-dry dressing. By description of his wife this is done quite nicely and there is certainly less debris over the wound surface. They already have a hospital bed with a level 3 pressure relief surface. The patient lies on his back when he is in bed especially at night although his wife is counseled him not to do this. They are starting sliding board transfers into his wheelchair I think with physical therapy. He has a Foley catheter in place Past medical history includes remote CVA, metastatic prostate cancer to large areas of his thoracic and lumbar spine and pelvis., Bilateral carotid artery stenosis, Parkinson's disease, type 2 diabetes, diastolic heart failure, CHF,  hypertension and a recently discovered possible pancreatic mass in the head of the pancreas 08/16/2020 patient I admitted to the clinic 2-1/2 weeks ago. He had an abscess site on his left buttock that required an IandD while he was in the hospital in September.  He has been using Santyl backing wet-to-dry. He is cared for her aerobically at home by his wife 2 concerning areas on his feet. A stage I pressure injury on the right heel this does not have an open wound. He also has on the lateral aspect of the left fourth toe a dry circular eschared wound. 12/2; monthly follow-up. Is a very disabled man who had an abscess in his left buttock and required an IandD. We have been using Santyl to this area I changed to silver collagen last time he is here and this actually looks quite a bit better ooThe last time he was here his wife showed me an area on the left lateral fourth toe I think a pressure injury with his fifth toe. Been using Santyl here as well but not making much improvement ooAs well she shows me he has an area on his right buttock which is very tiny pinpoint area but under illumination still obviously an open area. I wonder if this is more of a shear injury and transferring and a pressure injury per se 12/16; this is a very disabled man who has a left buttock area that required an IandD apparently an abscess. Since he has come here that he developed a area on the right buttock. His wife says that this is draining pus. Finally he has an area on the lateral part of the left fourth toe. We have been using silver collagen on the buttock and Iodoflex on the fourth toe 1/6; this is a disabled man we have been working on a left buttock area that was initially an IandD abscess injury. We have got this down to a small area in fact it appears to be epithelializing towards the base of the wound. When he was here last time he had a small draining area on the right buttock. I thought this might be a small cyst culture of the drainage was negative. I do not think this was an abscess at all While he has been here he has been developing areas on the toes on his left foot. He initially had one on the left fourth toe and then last time the right first toe. He is a  type II diabetic. With he has a history of coronary artery disease. He complains of a lot of pain in the right foot 1/13; we have been working on areas on his left buttock and then on the right buttock. Both of these are healed today albeit the area on the left has some depth with skin is gone into a divot. Nevertheless I think this is a reasonable outcome. He arrived a week ago with ischemic looking wounds on his right fifth, right first and his left fourth toe. He went on to have arterial studies which we received today these showed an ABI that was noncompressible on both sides however the great toe on the right had a TBI of 0.14 with a pressure of only 17. Monophasic waveforms on all the tibial vessels and including the popliteal on the left he had monophasic waveforms again noncompressible vessels and no recorded toe pressure. Clearly these are ischemic wounds. Fortunately he is not complaining of a lot of pain The patient has a complicated  medical situation which includes widely metastatic prostate cancer to bone for which she is on palliative care he also has Parkinson's disease. I have a recent note from his neurologist at which time he asked whether they wish to de-escalate the Parkinson's regimen and the answer was no. The patient has a follow-up appointment with Dr. Gwenlyn Found on 1/28. I believe he is the patient's cardiologist but this appointment was made because of the vascular studies I think predominantly through Dr. Kennon Holter office. Objective Constitutional Sitting or standing Blood Pressure is within target range for patient.. Pulse regular and within target range for patient.Marland Kitchen Respirations regular, non-labored and within target range.. Temperature is normal and within the target range for the patient.Marland Kitchen Appears in no distress. Vitals Time Taken: 2:55 PM, Height: 64 in, Weight: 150 lbs, BMI: 25.7, Temperature: 97.6 F, Pulse: 73 bpm, Respiratory Rate: 15 breaths/min, Blood  Pressure: 101/61 mmHg. General Notes: Wound exam oo The area on the right buttock is closed albeit epithelializing into a small divot. There is nothing open on the left buttock oo He continues to have significant ischemic wounds on the right first toe, right fifth toe, lateral aspect of the left fourth toe. The left fifth toe and the right fourth toe also looks somewhat dusky and ischemic Integumentary (Hair, Skin) Wound #1 status is Healed - Epithelialized. Original cause of wound was Gradually Appeared. The wound is located on the Left Gluteus. The wound measures 0cm length x 0cm width x 0cm depth; 0cm^2 area and 0cm^3 volume. There is Fat Layer (Subcutaneous Tissue) exposed. There is no tunneling or undermining noted. There is a medium amount of serosanguineous drainage noted. The wound margin is epibole. There is large (67-100%) pink granulation within the wound bed. There is no necrotic tissue within the wound bed. Wound #2 status is Open. Original cause of wound was Gradually Appeared. The wound is located on the Left,Lateral [name] Fourth. The wound measures 0.4cm oe length x 0.3cm width x 0.2cm depth; 0.094cm^2 area and 0.019cm^3 volume. There is no tunneling or undermining noted. There is a small amount of serous drainage noted. The wound margin is flat and intact. There is no granulation within the wound bed. There is a medium (34-66%) amount of necrotic tissue within the wound bed including Adherent Slough. Wound #4 status is Open. Original cause of wound was Not Known. The wound is located on the Right [name] Great. The wound measures 0.8cm length x 0.7cm oe width x 0.1cm depth; 0.44cm^2 area and 0.044cm^3 volume. There is bone and Fat Layer (Subcutaneous Tissue) exposed. There is no tunneling or undermining noted. There is a medium amount of serosanguineous drainage noted. The wound margin is flat and intact. There is small (1-33%) pink granulation within the wound bed. There is a large  (67-100%) amount of necrotic tissue within the wound bed including Adherent Slough. Wound #5 status is Open. Original cause of wound was Not Known. The wound is located on the Right [name] Fifth. The wound measures 0.9cm length x 0.4cm oe width x 0.1cm depth; 0.283cm^2 area and 0.028cm^3 volume. There is Fat Layer (Subcutaneous Tissue) exposed. There is no tunneling or undermining noted. There is a medium amount of serosanguineous drainage noted. The wound margin is flat and intact. There is small (1-33%) pink granulation within the wound bed. There is a large (67-100%) amount of necrotic tissue within the wound bed including Eschar. Assessment Active Problems ICD-10 Cutaneous abscess of buttock Unspecified open wound of left buttock, subsequent encounter Non-pressure chronic  ulcer of other part of left foot with other specified severity Non-pressure chronic ulcer of other part of right foot with necrosis of bone Type 2 diabetes mellitus with diabetic peripheral angiopathy without gangrene Plan Follow-up Appointments: Return appointment in 1 month. Off-Loading: Low air-loss mattress (Group 2) - CONTINUE TO USE. Turn and reposition every 2 hours Other: - CONTINUE TO USE BUNNY BOOTS FOR HEEL PROTECTION. UP IN WHEELCHAIR NO MORE THAT 2 HOUR INCREMENTS. Ensure to relieve pressure off buttock closed areas to prevent reopening. Home Health: No change in wound care orders this week; continue Home Health for wound care. May utilize formulary equivalent dressing for wound treatment orders unless otherwise specified. - [facility] weekly. WOUND #2: - [name] Fourth Wound Laterality: Left, Lateral oe Cleanser: Wound Cleanser (Home Health) Every Other Day/30 Days Discharge Instructions: Cleanse the wound with wound cleanser prior to applying a clean dressing using gauze sponges, not tissue or cotton balls. Prim Dressing: KerraCel Ag Gelling Fiber Dressing, 2x2 in (silver alginate) (Home Health) Every  Other Day/30 Days ary Discharge Instructions: Apply silver alginate to wound bed as instructed Secondary Dressing: Woven Gauze Sponges 2x2 in [facility]) Every Other Day/30 Days Discharge Instructions: Apply over primary dressing as directed. Secured With: Child psychotherapist, Sterile 2x75 (in/in) (Home Health) Every Other Day/30 Days Discharge Instructions: Secure with stretch gauze as directed. WOUND #4: - [name] Great Wound Laterality: Right oe Cleanser: Wound Cleanser (Home Health) Every Other Day/30 Days Discharge Instructions: Cleanse the wound with wound cleanser prior to applying a clean dressing using gauze sponges, not tissue or cotton balls. Prim Dressing: KerraCel Ag Gelling Fiber Dressing, 2x2 in (silver alginate) (Home Health) Every Other Day/30 Days ary Discharge Instructions: Apply silver alginate to wound bed as instructed Secondary Dressing: Woven Gauze Sponges 2x2 in [facility]) Every Other Day/30 Days Discharge Instructions: Apply over primary dressing as directed. Secured With: Child psychotherapist, Sterile 2x75 (in/in) (Home Health) Every Other Day/30 Days Discharge Instructions: Secure with stretch gauze as directed. WOUND #5: - [name] Fifth Wound Laterality: Right oe Cleanser: Wound Cleanser (Home Health) Every Other Day/30 Days Discharge Instructions: Cleanse the wound with wound cleanser prior to applying a clean dressing using gauze sponges, not tissue or cotton balls. Prim Dressing: KerraCel Ag Gelling Fiber Dressing, 2x2 in (silver alginate) (Home Health) Every Other Day/30 Days ary Discharge Instructions: Apply silver alginate to wound bed as instructed Secondary Dressing: Woven Gauze Sponges 2x2 in [facility]) Every Other Day/30 Days Discharge Instructions: Apply over primary dressing as directed. Secured With: Child psychotherapist, Sterile 2x75 (in/in) (Home Health) Every Other Day/30 Days Discharge Instructions: Secure with  stretch gauze as directed. 1. The original wounds that he came to see [place] about where the injuries to his buttock closed 2. He subsequently developed ischemic wounds of his toes as described. Although these are not spontaneously painful certainly when the digits are manipulated he reacts with significant pain. 3. I did discuss the ethical dilemma I see here he is on palliative care with metastatic prostate cancer to bone. Also the wife relates the fact that when his Aggrenox was previously stopped for some procedure he developed a stroke and so she does not want that done. 4. It does not sound like they would want anything is aggressive as an arteriogram although I told him that this is a risk of further breakdown and significant pain 5. I will reach out to Dr. Gwenlyn Found through secure text message to let them  know about this 6. I am going to give an appointment for 1 month with [place] in follow-up of his buttock areas and his toes to see if there is anything else we can do to help Electronic Signature(s) Signed: 10/25/2020 5:01:02 PM By: Linton Ham MD Entered By: Linton Ham on 10/25/2020 16:59:03 -------------------------------------------------------------------------------- SuperBill Details Patient Name: Date of Service: Luis Pence HN [name]. 10/25/2020 Medical Record Number: [id number] Patient Account Number: [id number] Date of Birth/Sex: Treating RN: [birth year] (75 y.o. [name], [name] Primary Care Provider: [name] Other Clinician: Referring Provider: Treating Provider/Extender: [name] in Treatment: 12 Diagnosis Coding ICD-10 Codes Code Description L02.31 Cutaneous abscess of buttock S31.829D Unspecified open wound of left buttock, subsequent encounter L97.528 Non-pressure chronic ulcer of other part of left foot with other specified severity L97.514 Non-pressure chronic ulcer of other part of right foot with necrosis of bone E11.51 Type 2  diabetes mellitus with diabetic peripheral angiopathy without gangrene Facility Procedures CPT4 Code: [id number] Description: [id number] - WOUND CARE VISIT-LEV 5 EST PT Modifier: Quantity: 1 Physician Procedures : CPT4 Code Description Modifier [id number] - WC PHYS LEVEL 4 - EST PT ICD-10 Diagnosis Description L97.528 Non-pressure chronic ulcer of other part of left foot with other specified severity L97.514 Non-pressure chronic ulcer of other part of right  foot with necrosis of bone E11.51 Type 2 diabetes mellitus with diabetic peripheral angiopathy without gangrene Quantity: 1 Electronic Signature(s) Signed: 10/25/2020 5:01:02 PM By: Linton Ham MD Entered By: Linton Ham on 10/25/2020 16:59:35

## 2020-10-30 ENCOUNTER — Other Ambulatory Visit: Payer: Self-pay

## 2020-10-30 ENCOUNTER — Other Ambulatory Visit: Payer: Medicare Other | Admitting: Nurse Practitioner

## 2020-10-30 DIAGNOSIS — C801 Malignant (primary) neoplasm, unspecified: Secondary | ICD-10-CM

## 2020-10-30 DIAGNOSIS — Z515 Encounter for palliative care: Secondary | ICD-10-CM | POA: Diagnosis not present

## 2020-10-30 NOTE — Progress Notes (Signed)
Therapist, nutritional Palliative Care Consult Note Telephone: [phone]  Fax: [phone]  PATIENT NAME: [name] [address] [place] [address] [phone] (home)  DOB: [birth year] MRN: [id number]  PRIMARY CARE PROVIDER:    Mila Palmer, MD,  [address] [place] [address] [phone]  REFERRING PROVIDER:   Mila Palmer, MD [address] [place],  [place] [address] [phone]  RESPONSIBLE PARTY:   Extended Emergency Contact Information Primary Emergency Contact: [name] Address: [address]          [place], [place] [address] [place] of [place] Home Phone: [phone] Mobile Phone: [phone] Relation: Relative Secondary Emergency Contact: [name] [place] of [place] Mobile Phone: [phone] Relation: Daughter  Due to ongoing inclement weather resulting in icy roads, the patient and family have given their verbal consent for a provider visit via tele-health from my home office. HIPPA policies of confidentially were discussed.   ASSESSMENT AND RECOMMENDATIONS:   Advance Care Planning: Goal of care: Goal of care is function. Patient desires to regain and maintain as much function as he possibly can. His goal is to live as long as possible. Directives: Patient's code status is Full code. Signed MOST form in home and on [facility] Epic EMR. MOST formdetails include; CPR, Full scope of treatment, antibiotics if indicated, IV fluids if indicatedandfeeding tube for a defined trial period.  Symptom Management:  Family report patient doing doing well. Report wound on buttocks completely healed. Report ongoing wound to right great toe and fifth toe on left foot. Poor wound healing related to poor peripheral circulation, he is followed by vascular surgeon. Patient and family does want invasive procedure that could necessitate stopping blood thinner due  to risk of repeat CVA. Family believed that risk of CVA out weighs its benefit. Wife believed the wound is healing, daily wound dressing with Silver Alginate. Pain at site managed with PRN Tylenol, patient endorsed adequate pain control. Family report patient has been approved  for a motorized wheelchair which will improve his mobility. Patient receives PT twice a week, was discharged from OT dues to meeting goals.   Family report patient's Neurologist Dr. Lucita [name] has signed off on patient. Family report patient was prescribed a year refill of Keppra, Sinemet and Aggrenox. Family report Neurologist believed ongoing management of patient is prolonging the inevitable. Family and patient expressed dissatisfaction with the way this was communicated to them. Patient verbalized desire to continue treatment, saying as long as he is offered treatment he wants to continue treatments. Family believed patient has been having significant improvement since last hospital discharge with current plan of care. Family expressed desire to continue Keppra for seizure prevention reporting patient has not had seizure in 12 years., Sinemet to prevent muscle rigidity and improve muscle tone, and Agrenox to prevent repeat stroke. Patient and family made aware that metastatic Cancer usually carry a poor prognosis. We discussed patient's current illness and what it means in the larger context of his on-going co-morbidities. Natural disease trajectory and expectations at EOL was also discussed. Patient and family made aware that decision to refuse treatment lies solely on patient, and that if patient believed his quality of life is improved with current treatment, palliative care will provide support and guidance through his healthcare decisions. Family report patient was given a grime prognosis after is first CVA in 1999 but was able to pull through. Patient denied uncontrolled pain, denied SOB. He is  alert, oriented, and coherent.  No report of fever, chills,or insomnia.. Ongoing refill of above medication may have to be filled by patient's primary care provider. Provided general support and encouragement, no other unmet needs identified.  Follow up Palliative Care Visit: Palliative care will continue to follow for goals of care clarification and symptom management. Return in about 4 weeks or prn.  Family /Caregiver/Community Supports: Patient lives at [employer] his ex-wifeJacquelyn whois his main caregiver.Luis very knowledgeable about patient's condition and coordinates his care. Patient's daughter in-law is a Equities trader, she assist with his care.   Cognitive / Functional decline: Patient is coherent,able to make his own decisions.He is dependent on family for all of his ADLs,able to feed self. Family report ongoing improvement in function, now successfully uses slide board to self transfer from bed to wheelchair and bedside comode with stand by assist, continent of his bowels. No report of falls.  I spent 46 minutes providing this consultation. More than 50% of the time in this consultation was spent counseling and coordinating communication.   CHIEF COMPLAINT: Follow up palliative care visit  HISTORY OF PRESENT ILLNESS:Luis T Cheshireis a 75 y.o.year old malewith multiple medical problems including Prostate cancerwith mets bones and pelvis, CHF (EF 60-65%), Parkinson disease,Type 2 diabetes, seizure disorder, leftarm paralysisfrom prior CVA. Palliative Care was asked to help address advance care planning, goals of care and symptoms management.   CODE STATUS: Full code  PPS: 40%  HOSPICE ELIGIBILITY/DIAGNOSIS: TBD  PHYSICAL EXAM / ROS:   General/constitutional: denied fever, or chills Cardiovascular: denied chest pain, no report of palpitation Pulmonary: no cough, no SOB, room air GI: appetite good, denied constipation, continent of bowel GU: denies dysuria, chronic foley MSK:  Left arm  contracture and paralysis, non-ambulatory Skin: report of wound right toes Neurological: Weakness, otherwise no- focal  PAST MEDICAL HISTORY:  Past Medical History:  Diagnosis Date  . At high risk for falls   . Bladder cancer [facility])    urologist-  dr Gaynelle Arabian  . CAD (coronary artery disease) cardiologist-  dr berry  . Cerebrovascular arteriosclerosis   . Gait disturbance, post-stroke    uses cane, rollator, and w/c long distance  . Hemiparkinsonism [facility]) neurologist-- dr Leta Baptist   right side body  . History of basal cell carcinoma excision    Sept 2016--  MOH's surgery side of nose  . History of carotid artery stenosis cardiologist-- dr berry   bilateral --  1999 s/p  bilateral ICA endarterectomy and recurrent restenosis 2012  s/p  staged bilateral stenting   per last duplex 2015  stents widely patent  . History of CVA with residual deficit neurologist-  dr Leta Baptist   3/ 1999  Right MCA  and  12/ 2004  Anterior division of  Right MCA ---  residual left spactic hemiparesis and left foot drop  . History of MI (myocardial infarction)    02/ 2004   s/p  cabg   . HOH (hard of hearing)    LEFT EAR  POST cva  . Hyperextension deformity of left knee    wears brace  . Hyperlipidemia   . Hypertension   . Left foot drop    residual from CVA  -- wears brace  . Left spastic hemiparesis ([place])    residual CVA 1999  . Pancreatic pseudocyst   . Peripheral arterial disease ([place])   . Prostate cancer ([place])   . S/P CABG x 4    02/ 2004  . Seizure disorder ([place])  last seizure 2011 due to confusion   neurologist-  dr Leta Baptist-- per note seizure documented 2008 breakthrough partial complex seizure (prior tonic-clonic seizure's post CVA)  . Type 2 diabetes, diet controlled ([place])     2  . Visual neglect    LEFT EYE    SOCIAL HX:  Social History   Tobacco Use  . Smoking status: Former Smoker    Packs/day: 0.25    Years: 5.00    Pack years: 1.25    Types: Cigarettes    Quit date:  11/14/2007    Years since quitting: 12.9  . Smokeless tobacco: Never Used  Substance Use Topics  . Alcohol use: No   FAMILY HX:  Family History  Problem Relation Age of Onset  . Stroke Father   . Heart attack Father   . Stroke Mother   . Diabetes Brother   . Hyperlipidemia Brother   . Hypertension Brother   . Hyperlipidemia Sister   . Hypertension Sister   . Breast cancer Neg Hx   . Colon cancer Neg Hx   . Pancreatic cancer Neg Hx   . Prostate cancer Neg Hx     ALLERGIES:  Allergies  Allergen Reactions  . Altace [Ramipril] Cough  . Keflex [Cephalexin] Itching and Rash     PERTINENT MEDICATIONS:  Outpatient Encounter Medications as of 10/30/2020  Medication Sig  . acetaminophen (TYLENOL) 325 MG tablet Take 2 tablets (650 mg total) by mouth every 6 (six) hours as needed for mild pain (or Fever >/= 101).  Marland Kitchen amLODipine (NORVASC) 10 MG tablet TAKE ONE TABLET BY MOUTH DAILY  . carbidopa-levodopa (SINEMET IR) 25-100 MG tablet Take 2 tablets by mouth 3 (three) times daily. Change: Take Two tabs 3 x daily  . collagenase (SANTYL) ointment Apply topically 2 (two) times daily.  . dantrolene (DANTRIUM) 50 MG capsule TAKE ONE CAPSULE BY MOUTH TWICE A DAY  . dipyridamole-aspirin (AGGRENOX) 200-25 MG 12hr capsule Take 1 capsule by mouth 2 (two) times daily.  Marland Kitchen ezetimibe (ZETIA) 10 MG tablet TAKE ONE TABLET BY MOUTH DAILY  . fenofibrate (TRICOR) 48 MG tablet TAKE ONE TABLET BY MOUTH DAILY  . FOLBIC 2.5-25-2 MG TABS tablet Take 1 tablet by mouth once daily with food  . furosemide (LASIX) 20 MG tablet TAKE ONE TABLET BY MOUTH DAILY  . hydrALAZINE (APRESOLINE) 25 MG tablet Take 25 mg by mouth 1 day or 1 dose.   . insulin glargine (LANTUS) 100 UNIT/ML Solostar Pen Inject 10 Units into the skin daily.  . insulin lispro (HUMALOG KWIKPEN) 100 UNIT/ML KiwkPen Inject 4 Units into the skin 3 (three) times daily as needed (high blood sugar > 200).   Marland Kitchen KEPPRA 500 MG tablet Take 1 tablet (500 mg total)  by mouth 2 (two) times daily.  . metoprolol tartrate (LOPRESSOR) 25 MG tablet Take 1 tablet (25 mg total) by mouth daily.  . nitroGLYCERIN (NITROSTAT) 0.4 MG SL tablet Place 1 tablet (0.4 mg total) under the tongue every 5 (five) minutes as needed for chest pain. (Patient not taking: Reported on 07/17/2020)  . olmesartan (BENICAR) 20 MG tablet Take 20 mg by mouth daily.  Glory Rosebush DELICA LANCETS 25E MISC   . ONETOUCH VERIO test strip   . oxyCODONE-acetaminophen (PERCOCET/ROXICET) 5-325 MG tablet Take by mouth every 4 (four) hours as needed for severe pain. (Patient not taking: Reported on 07/17/2020)  . RAPAFLO 8 MG CAPS capsule Take 8 mg by mouth daily with breakfast.   . rosuvastatin (CRESTOR)  20 MG tablet TAKE ONE TABLET BY MOUTH DAILY   No facility-administered encounter medications on file as of 10/30/2020.    Thank you for the opportunity to participate in the care of Mr. Chidi Shirer. The palliative care team will continue to follow. Please call our office at [phone] if we can be of additional assistance.   Jari Favre, DNP, AGPCNP-BC

## 2020-10-31 DIAGNOSIS — S90411D Abrasion, right great toe, subsequent encounter: Secondary | ICD-10-CM | POA: Diagnosis not present

## 2020-10-31 DIAGNOSIS — L89892 Pressure ulcer of other site, stage 2: Secondary | ICD-10-CM | POA: Diagnosis not present

## 2020-10-31 DIAGNOSIS — R1310 Dysphagia, unspecified: Secondary | ICD-10-CM | POA: Diagnosis not present

## 2020-10-31 DIAGNOSIS — E1122 Type 2 diabetes mellitus with diabetic chronic kidney disease: Secondary | ICD-10-CM | POA: Diagnosis not present

## 2020-10-31 DIAGNOSIS — I69354 Hemiplegia and hemiparesis following cerebral infarction affecting left non-dominant side: Secondary | ICD-10-CM | POA: Diagnosis not present

## 2020-10-31 DIAGNOSIS — G20 Parkinson's disease: Secondary | ICD-10-CM | POA: Diagnosis not present

## 2020-10-31 DIAGNOSIS — I5033 Acute on chronic diastolic (congestive) heart failure: Secondary | ICD-10-CM | POA: Diagnosis not present

## 2020-10-31 DIAGNOSIS — Z794 Long term (current) use of insulin: Secondary | ICD-10-CM | POA: Diagnosis not present

## 2020-10-31 DIAGNOSIS — L89153 Pressure ulcer of sacral region, stage 3: Secondary | ICD-10-CM | POA: Diagnosis not present

## 2020-10-31 DIAGNOSIS — I130 Hypertensive heart and chronic kidney disease with heart failure and stage 1 through stage 4 chronic kidney disease, or unspecified chronic kidney disease: Secondary | ICD-10-CM | POA: Diagnosis not present

## 2020-10-31 DIAGNOSIS — L89323 Pressure ulcer of left buttock, stage 3: Secondary | ICD-10-CM | POA: Diagnosis not present

## 2020-10-31 DIAGNOSIS — N1832 Chronic kidney disease, stage 3b: Secondary | ICD-10-CM | POA: Diagnosis not present

## 2020-11-01 DIAGNOSIS — R1310 Dysphagia, unspecified: Secondary | ICD-10-CM | POA: Diagnosis not present

## 2020-11-01 DIAGNOSIS — L89892 Pressure ulcer of other site, stage 2: Secondary | ICD-10-CM | POA: Diagnosis not present

## 2020-11-01 DIAGNOSIS — I130 Hypertensive heart and chronic kidney disease with heart failure and stage 1 through stage 4 chronic kidney disease, or unspecified chronic kidney disease: Secondary | ICD-10-CM | POA: Diagnosis not present

## 2020-11-01 DIAGNOSIS — J9690 Respiratory failure, unspecified, unspecified whether with hypoxia or hypercapnia: Secondary | ICD-10-CM | POA: Diagnosis not present

## 2020-11-01 DIAGNOSIS — B3749 Other urogenital candidiasis: Secondary | ICD-10-CM | POA: Diagnosis not present

## 2020-11-01 DIAGNOSIS — E1122 Type 2 diabetes mellitus with diabetic chronic kidney disease: Secondary | ICD-10-CM | POA: Diagnosis not present

## 2020-11-01 DIAGNOSIS — N1832 Chronic kidney disease, stage 3b: Secondary | ICD-10-CM | POA: Diagnosis not present

## 2020-11-01 DIAGNOSIS — L89153 Pressure ulcer of sacral region, stage 3: Secondary | ICD-10-CM | POA: Diagnosis not present

## 2020-11-01 DIAGNOSIS — I69354 Hemiplegia and hemiparesis following cerebral infarction affecting left non-dominant side: Secondary | ICD-10-CM | POA: Diagnosis not present

## 2020-11-01 DIAGNOSIS — S90411D Abrasion, right great toe, subsequent encounter: Secondary | ICD-10-CM | POA: Diagnosis not present

## 2020-11-01 DIAGNOSIS — G20 Parkinson's disease: Secondary | ICD-10-CM | POA: Diagnosis not present

## 2020-11-01 DIAGNOSIS — L89323 Pressure ulcer of left buttock, stage 3: Secondary | ICD-10-CM | POA: Diagnosis not present

## 2020-11-01 DIAGNOSIS — Z794 Long term (current) use of insulin: Secondary | ICD-10-CM | POA: Diagnosis not present

## 2020-11-01 DIAGNOSIS — I5033 Acute on chronic diastolic (congestive) heart failure: Secondary | ICD-10-CM | POA: Diagnosis not present

## 2020-11-01 DIAGNOSIS — L89300 Pressure ulcer of unspecified buttock, unstageable: Secondary | ICD-10-CM | POA: Diagnosis not present

## 2020-11-03 DIAGNOSIS — L89892 Pressure ulcer of other site, stage 2: Secondary | ICD-10-CM | POA: Diagnosis not present

## 2020-11-03 DIAGNOSIS — R1310 Dysphagia, unspecified: Secondary | ICD-10-CM | POA: Diagnosis not present

## 2020-11-03 DIAGNOSIS — I130 Hypertensive heart and chronic kidney disease with heart failure and stage 1 through stage 4 chronic kidney disease, or unspecified chronic kidney disease: Secondary | ICD-10-CM | POA: Diagnosis not present

## 2020-11-03 DIAGNOSIS — L89153 Pressure ulcer of sacral region, stage 3: Secondary | ICD-10-CM | POA: Diagnosis not present

## 2020-11-03 DIAGNOSIS — G20 Parkinson's disease: Secondary | ICD-10-CM | POA: Diagnosis not present

## 2020-11-03 DIAGNOSIS — N1832 Chronic kidney disease, stage 3b: Secondary | ICD-10-CM | POA: Diagnosis not present

## 2020-11-03 DIAGNOSIS — L89323 Pressure ulcer of left buttock, stage 3: Secondary | ICD-10-CM | POA: Diagnosis not present

## 2020-11-03 DIAGNOSIS — Z794 Long term (current) use of insulin: Secondary | ICD-10-CM | POA: Diagnosis not present

## 2020-11-03 DIAGNOSIS — I5033 Acute on chronic diastolic (congestive) heart failure: Secondary | ICD-10-CM | POA: Diagnosis not present

## 2020-11-03 DIAGNOSIS — S90411D Abrasion, right great toe, subsequent encounter: Secondary | ICD-10-CM | POA: Diagnosis not present

## 2020-11-03 DIAGNOSIS — E1122 Type 2 diabetes mellitus with diabetic chronic kidney disease: Secondary | ICD-10-CM | POA: Diagnosis not present

## 2020-11-03 DIAGNOSIS — I69354 Hemiplegia and hemiparesis following cerebral infarction affecting left non-dominant side: Secondary | ICD-10-CM | POA: Diagnosis not present

## 2020-11-04 DIAGNOSIS — L89150 Pressure ulcer of sacral region, unstageable: Secondary | ICD-10-CM | POA: Diagnosis not present

## 2020-11-04 DIAGNOSIS — L89153 Pressure ulcer of sacral region, stage 3: Secondary | ICD-10-CM | POA: Diagnosis not present

## 2020-11-04 DIAGNOSIS — L89892 Pressure ulcer of other site, stage 2: Secondary | ICD-10-CM | POA: Diagnosis not present

## 2020-11-05 DIAGNOSIS — Z794 Long term (current) use of insulin: Secondary | ICD-10-CM | POA: Diagnosis not present

## 2020-11-05 DIAGNOSIS — L8990 Pressure ulcer of unspecified site, unspecified stage: Secondary | ICD-10-CM | POA: Diagnosis not present

## 2020-11-05 DIAGNOSIS — I10 Essential (primary) hypertension: Secondary | ICD-10-CM | POA: Diagnosis not present

## 2020-11-05 DIAGNOSIS — E441 Mild protein-calorie malnutrition: Secondary | ICD-10-CM | POA: Diagnosis not present

## 2020-11-05 DIAGNOSIS — E1122 Type 2 diabetes mellitus with diabetic chronic kidney disease: Secondary | ICD-10-CM | POA: Diagnosis not present

## 2020-11-05 DIAGNOSIS — C7982 Secondary malignant neoplasm of genital organs: Secondary | ICD-10-CM | POA: Diagnosis not present

## 2020-11-05 DIAGNOSIS — D649 Anemia, unspecified: Secondary | ICD-10-CM | POA: Diagnosis not present

## 2020-11-06 DIAGNOSIS — Z794 Long term (current) use of insulin: Secondary | ICD-10-CM | POA: Diagnosis not present

## 2020-11-06 DIAGNOSIS — G20 Parkinson's disease: Secondary | ICD-10-CM | POA: Diagnosis not present

## 2020-11-06 DIAGNOSIS — L89153 Pressure ulcer of sacral region, stage 3: Secondary | ICD-10-CM | POA: Diagnosis not present

## 2020-11-06 DIAGNOSIS — L89323 Pressure ulcer of left buttock, stage 3: Secondary | ICD-10-CM | POA: Diagnosis not present

## 2020-11-06 DIAGNOSIS — L89892 Pressure ulcer of other site, stage 2: Secondary | ICD-10-CM | POA: Diagnosis not present

## 2020-11-06 DIAGNOSIS — E1122 Type 2 diabetes mellitus with diabetic chronic kidney disease: Secondary | ICD-10-CM | POA: Diagnosis not present

## 2020-11-06 DIAGNOSIS — R1310 Dysphagia, unspecified: Secondary | ICD-10-CM | POA: Diagnosis not present

## 2020-11-06 DIAGNOSIS — I5033 Acute on chronic diastolic (congestive) heart failure: Secondary | ICD-10-CM | POA: Diagnosis not present

## 2020-11-06 DIAGNOSIS — N1832 Chronic kidney disease, stage 3b: Secondary | ICD-10-CM | POA: Diagnosis not present

## 2020-11-06 DIAGNOSIS — I130 Hypertensive heart and chronic kidney disease with heart failure and stage 1 through stage 4 chronic kidney disease, or unspecified chronic kidney disease: Secondary | ICD-10-CM | POA: Diagnosis not present

## 2020-11-06 DIAGNOSIS — S90411D Abrasion, right great toe, subsequent encounter: Secondary | ICD-10-CM | POA: Diagnosis not present

## 2020-11-06 DIAGNOSIS — I69354 Hemiplegia and hemiparesis following cerebral infarction affecting left non-dominant side: Secondary | ICD-10-CM | POA: Diagnosis not present

## 2020-11-08 DIAGNOSIS — I69354 Hemiplegia and hemiparesis following cerebral infarction affecting left non-dominant side: Secondary | ICD-10-CM | POA: Diagnosis not present

## 2020-11-08 DIAGNOSIS — Z794 Long term (current) use of insulin: Secondary | ICD-10-CM | POA: Diagnosis not present

## 2020-11-08 DIAGNOSIS — I130 Hypertensive heart and chronic kidney disease with heart failure and stage 1 through stage 4 chronic kidney disease, or unspecified chronic kidney disease: Secondary | ICD-10-CM | POA: Diagnosis not present

## 2020-11-08 DIAGNOSIS — L89323 Pressure ulcer of left buttock, stage 3: Secondary | ICD-10-CM | POA: Diagnosis not present

## 2020-11-08 DIAGNOSIS — R1310 Dysphagia, unspecified: Secondary | ICD-10-CM | POA: Diagnosis not present

## 2020-11-08 DIAGNOSIS — S90411D Abrasion, right great toe, subsequent encounter: Secondary | ICD-10-CM | POA: Diagnosis not present

## 2020-11-08 DIAGNOSIS — G20 Parkinson's disease: Secondary | ICD-10-CM | POA: Diagnosis not present

## 2020-11-08 DIAGNOSIS — E1122 Type 2 diabetes mellitus with diabetic chronic kidney disease: Secondary | ICD-10-CM | POA: Diagnosis not present

## 2020-11-08 DIAGNOSIS — I5033 Acute on chronic diastolic (congestive) heart failure: Secondary | ICD-10-CM | POA: Diagnosis not present

## 2020-11-08 DIAGNOSIS — L89892 Pressure ulcer of other site, stage 2: Secondary | ICD-10-CM | POA: Diagnosis not present

## 2020-11-08 DIAGNOSIS — L89153 Pressure ulcer of sacral region, stage 3: Secondary | ICD-10-CM | POA: Diagnosis not present

## 2020-11-08 DIAGNOSIS — N1832 Chronic kidney disease, stage 3b: Secondary | ICD-10-CM | POA: Diagnosis not present

## 2020-11-09 ENCOUNTER — Ambulatory Visit: Payer: Medicare Other | Admitting: Cardiovascular Disease

## 2020-11-09 ENCOUNTER — Encounter: Payer: Self-pay | Admitting: Cardiovascular Disease

## 2020-11-09 ENCOUNTER — Other Ambulatory Visit: Payer: Self-pay

## 2020-11-09 DIAGNOSIS — Z8673 Personal history of transient ischemic attack (TIA), and cerebral infarction without residual deficits: Secondary | ICD-10-CM

## 2020-11-09 DIAGNOSIS — I10 Essential (primary) hypertension: Secondary | ICD-10-CM | POA: Diagnosis not present

## 2020-11-09 DIAGNOSIS — Z951 Presence of aortocoronary bypass graft: Secondary | ICD-10-CM | POA: Diagnosis not present

## 2020-11-09 DIAGNOSIS — I6523 Occlusion and stenosis of bilateral carotid arteries: Secondary | ICD-10-CM | POA: Diagnosis not present

## 2020-11-09 DIAGNOSIS — R339 Retention of urine, unspecified: Secondary | ICD-10-CM | POA: Diagnosis not present

## 2020-11-09 DIAGNOSIS — E785 Hyperlipidemia, unspecified: Secondary | ICD-10-CM | POA: Diagnosis not present

## 2020-11-09 NOTE — Assessment & Plan Note (Signed)
History of remote right brain stroke with left-sided paralysis on Aggrenox

## 2020-11-09 NOTE — Assessment & Plan Note (Signed)
History of essential hypertension blood pressure measured today 124/52.  He is on amlodipine, hydralazine, and metoprolol.  He was on Benicar which has been discontinued because of declining renal function.

## 2020-11-09 NOTE — Assessment & Plan Note (Signed)
History of carotid artery disease status post bilateral carotid stenting by myself and Dr. Trula Slade back in 2012.  His most recent carotid Dopplers performed 11/01/2018 revealed widely patent carotid stents.  This will be repeated on annual basis.

## 2020-11-09 NOTE — Progress Notes (Signed)
11/09/2020 [name]   19-Apr-1946  [id number]  Primary Physician [name], MD Primary Cardiologist: Lorretta Harp MD [name], [place]  HPI:  [name] is a 75 y.o.   mildly overweight but was Caucasian male father of 4 childrengrandfather of 8 grandchildren who isaccompanied by his first ex-wife Deneen Harts is his caregiver. I last saw him in the office 07/17/2020. His primary care physician is Dr. [name]. He he is a retired Administrator. His cardiac risk factor profile was positive for 75 pack years of tobacco smoking having quit in 2009. He is treated hyperlipidemia. He did have bilateral carotid endarterectomies back in 1999 a sister with a stroke that caused left hemiparesis and paresthesias. He had bilateral carotid stenting performed by Dr. Trula Slade and myself in a staged fashion because of restenosis back in 2012. He also clearly has Parkinson's disease which has been progressive over the last year now requiring him to ambulate with the aid of a walker.. He had coronary bypass grafting in 2004 by Dr. Merilynn Finland and a Myoview stress test performed 01/09/11 was nonischemic. He denies chest pain or shortness of breath. He has been diagnosed with bladder cancer and is currently being treated by Dr. Denton Brick. He has had 4 bladder surgeries and treatment with BCG.. Addition, he was recently diagnosed with Parkinson's disease and is wheelchair-bound.  He was hospitalized at [facility] for [facility] 06/19/2020 for question of aspiration pneumonia and abscess on his buttocks.  He does have metastatic prostate cancer which is now progressed to his bones getting hormonal therapy.  He no longer is wheelchair-bound but bed to stretcher.  Hospice/palliative care is involved and his advanced directives are such that he is a DNR.  Since I saw him last his buttock wound has been cared for by Dr. Dellia Nims at the wound care center which has apparently  healed.  He also has some wounds on his right first fourth and fifth toes which Dr. Dellia Nims is treating as well.  He had lower extremity arterial Doppler studies performed 10/24/2020 revealing noncompressible ABIs with a total right SFA and popliteal artery.  His serum creatinine is currently elevated 2.6.  Given his comorbidities and life expectancy I do not feel he is a candidate for endovascular therapy at this time and the family agrees.   Current Meds  Medication Sig  . acetaminophen (TYLENOL) 325 MG tablet Take 2 tablets (650 mg total) by mouth every 6 (six) hours as needed for mild pain (or Fever >/= 101).  Marland Kitchen amLODipine (NORVASC) 10 MG tablet TAKE ONE TABLET BY MOUTH DAILY  . carbidopa-levodopa (SINEMET IR) 25-100 MG tablet Take 2 tablets by mouth 3 (three) times daily. Change: Take Two tabs 3 x daily  . collagenase (SANTYL) ointment Apply topically 2 (two) times daily.  . dantrolene (DANTRIUM) 50 MG capsule TAKE ONE CAPSULE BY MOUTH TWICE A DAY  . dipyridamole-aspirin (AGGRENOX) 200-25 MG 12hr capsule Take 1 capsule by mouth 2 (two) times daily.  Marland Kitchen ezetimibe (ZETIA) 10 MG tablet TAKE ONE TABLET BY MOUTH DAILY  . fenofibrate (TRICOR) 48 MG tablet TAKE ONE TABLET BY MOUTH DAILY  . FOLBIC 2.5-25-2 MG TABS tablet Take 1 tablet by mouth once daily with food  . furosemide (LASIX) 20 MG tablet TAKE ONE TABLET BY MOUTH DAILY  . hydrALAZINE (APRESOLINE) 25 MG tablet Take 50 mg by mouth daily.  . insulin glargine (LANTUS) 100 UNIT/ML Solostar Pen Inject 10 Units  into the skin daily.  . insulin lispro (HUMALOG) 100 UNIT/ML KiwkPen Inject 4 Units into the skin 3 (three) times daily as needed (high blood sugar > 200).   Marland Kitchen KEPPRA 500 MG tablet Take 1 tablet (500 mg total) by mouth 2 (two) times daily.  . metoprolol tartrate (LOPRESSOR) 25 MG tablet Take 1 tablet (25 mg total) by mouth daily.  . nitroGLYCERIN (NITROSTAT) 0.4 MG SL tablet Place 1 tablet (0.4 mg total) under the tongue every 5 (five)  minutes as needed for chest pain.  Glory Rosebush DELICA LANCETS 40X MISC   . ONETOUCH VERIO test strip   . oxyCODONE-acetaminophen (PERCOCET/ROXICET) 5-325 MG tablet Take by mouth every 4 (four) hours as needed for severe pain.  Marland Kitchen RAPAFLO 8 MG CAPS capsule Take 8 mg by mouth daily with breakfast.   . rosuvastatin (CRESTOR) 20 MG tablet TAKE ONE TABLET BY MOUTH DAILY  . [DISCONTINUED] olmesartan (BENICAR) 20 MG tablet Take 20 mg by mouth daily.     Allergies  Allergen Reactions  . Altace [Ramipril] Cough  . Keflex [Cephalexin] Itching and Rash    Social History   Socioeconomic History  . Marital status: Divorced    Spouse name: Not on file  . Number of children: 4  . Years of education: HS  . Highest education level: Not on file  Occupational History    Employer: RETIRED  Tobacco Use  . Smoking status: Former Smoker    Packs/day: 0.25    Years: 5.00    Pack years: 1.25    Types: Cigarettes    Quit date: 11/14/2007    Years since quitting: 12.9  . Smokeless tobacco: Never Used  Vaping Use  . Vaping Use: Never used  Substance and Sexual Activity  . Alcohol use: No  . Drug use: No  . Sexual activity: Not Currently  Other Topics Concern  . Not on file  Social History Narrative   Patient lives at home with his caregiver/ex-wife.   Caffeine- 2 cups of coffee daily   Social Determinants of Health   Financial Resource Strain: Not on file  Food Insecurity: Not on file  Transportation Needs: Not on file  Physical Activity: Not on file  Stress: Not on file  Social Connections: Not on file  Intimate Partner Violence: Not on file     Review of Systems: General: negative for chills, fever, night sweats or weight changes.  Cardiovascular: negative for chest pain, dyspnea on exertion, edema, orthopnea, palpitations, paroxysmal nocturnal dyspnea or shortness of breath Dermatological: negative for rash Respiratory: negative for cough or wheezing Urologic: negative for  hematuria Abdominal: negative for nausea, vomiting, diarrhea, bright red blood per rectum, melena, or hematemesis Neurologic: negative for visual changes, syncope, or dizziness All other systems reviewed and are otherwise negative except as noted above.    Blood pressure (!) 124/52, pulse 63, height 5\' 4"  (1.626 m), weight 137 lb (62.1 kg).  General appearance: alert and no distress Neck: no adenopathy, no carotid bruit, no JVD, supple, symmetrical, trachea midline and thyroid not enlarged, symmetric, no tenderness/mass/nodules Lungs: clear to auscultation bilaterally Heart: regular rate and rhythm, S1, S2 normal, no murmur, click, rub or gallop Extremities: extremities normal, atraumatic, no cyanosis or edema Pulses: 2+ and symmetric Diminished pedal pulses bilaterally Skin: Ischemic wounds on the right first fourth and fifth toes Neurologic: Alert and oriented X 3, normal strength and tone. Normal symmetric reflexes. Normal coordination and gait  EKG sinus rhythm at 63 without ST or  T wave changes.  I personally reviewed this EKG.  ASSESSMENT AND PLAN:   Carotid stenosis History of carotid artery disease status post bilateral carotid stenting by myself and Dr. Trula Slade back in 2012.  His most recent carotid Dopplers performed 11/01/2018 revealed widely patent carotid stents.  This will be repeated on annual basis.  Hx of CABG History of CAD status post coronary artery bypass grafting in 2004 by Dr. Roxan Hockey.  Myoview performed 01/09/2011 was nonischemic.  He denies chest pain or shortness of breath.  Dyslipidemia, goal LDL below 70 History of dyslipidemia on Zetia and fenofibrate as well as Crestor with lipid profile performed 05/03/2020 revealing total cholesterol 112, LDL 48 and HDL of 35.  Essential hypertension History of essential hypertension blood pressure measured today 124/52.  He is on amlodipine, hydralazine, and metoprolol.  He was on Benicar which has been discontinued  because of declining renal function.  History of stroke History of remote right brain stroke with left-sided paralysis on Aggrenox      Lorretta Harp MD [facility], [facility] 11/09/2020 10:43 AM

## 2020-11-09 NOTE — Assessment & Plan Note (Signed)
History of dyslipidemia on Zetia and fenofibrate as well as Crestor with lipid profile performed 05/03/2020 revealing total cholesterol 112, LDL 48 and HDL of 35.

## 2020-11-09 NOTE — Patient Instructions (Signed)

## 2020-11-09 NOTE — Assessment & Plan Note (Signed)
History of CAD status post coronary artery bypass grafting in 2004 by Dr. Roxan Hockey.  Myoview performed 01/09/2011 was nonischemic.  He denies chest pain or shortness of breath.

## 2020-11-13 DIAGNOSIS — N1832 Chronic kidney disease, stage 3b: Secondary | ICD-10-CM | POA: Diagnosis not present

## 2020-11-13 DIAGNOSIS — I130 Hypertensive heart and chronic kidney disease with heart failure and stage 1 through stage 4 chronic kidney disease, or unspecified chronic kidney disease: Secondary | ICD-10-CM | POA: Diagnosis not present

## 2020-11-13 DIAGNOSIS — S90411D Abrasion, right great toe, subsequent encounter: Secondary | ICD-10-CM | POA: Diagnosis not present

## 2020-11-13 DIAGNOSIS — G20 Parkinson's disease: Secondary | ICD-10-CM | POA: Diagnosis not present

## 2020-11-13 DIAGNOSIS — L89892 Pressure ulcer of other site, stage 2: Secondary | ICD-10-CM | POA: Diagnosis not present

## 2020-11-13 DIAGNOSIS — L89323 Pressure ulcer of left buttock, stage 3: Secondary | ICD-10-CM | POA: Diagnosis not present

## 2020-11-13 DIAGNOSIS — Z794 Long term (current) use of insulin: Secondary | ICD-10-CM | POA: Diagnosis not present

## 2020-11-13 DIAGNOSIS — I69354 Hemiplegia and hemiparesis following cerebral infarction affecting left non-dominant side: Secondary | ICD-10-CM | POA: Diagnosis not present

## 2020-11-13 DIAGNOSIS — E1122 Type 2 diabetes mellitus with diabetic chronic kidney disease: Secondary | ICD-10-CM | POA: Diagnosis not present

## 2020-11-13 DIAGNOSIS — I5033 Acute on chronic diastolic (congestive) heart failure: Secondary | ICD-10-CM | POA: Diagnosis not present

## 2020-11-15 DIAGNOSIS — G20 Parkinson's disease: Secondary | ICD-10-CM | POA: Diagnosis not present

## 2020-11-15 DIAGNOSIS — E1122 Type 2 diabetes mellitus with diabetic chronic kidney disease: Secondary | ICD-10-CM | POA: Diagnosis not present

## 2020-11-15 DIAGNOSIS — L89892 Pressure ulcer of other site, stage 2: Secondary | ICD-10-CM | POA: Diagnosis not present

## 2020-11-15 DIAGNOSIS — L89323 Pressure ulcer of left buttock, stage 3: Secondary | ICD-10-CM | POA: Diagnosis not present

## 2020-11-15 DIAGNOSIS — I130 Hypertensive heart and chronic kidney disease with heart failure and stage 1 through stage 4 chronic kidney disease, or unspecified chronic kidney disease: Secondary | ICD-10-CM | POA: Diagnosis not present

## 2020-11-15 DIAGNOSIS — I5033 Acute on chronic diastolic (congestive) heart failure: Secondary | ICD-10-CM | POA: Diagnosis not present

## 2020-11-15 DIAGNOSIS — I69354 Hemiplegia and hemiparesis following cerebral infarction affecting left non-dominant side: Secondary | ICD-10-CM | POA: Diagnosis not present

## 2020-11-15 DIAGNOSIS — N1832 Chronic kidney disease, stage 3b: Secondary | ICD-10-CM | POA: Diagnosis not present

## 2020-11-15 DIAGNOSIS — S90411D Abrasion, right great toe, subsequent encounter: Secondary | ICD-10-CM | POA: Diagnosis not present

## 2020-11-15 DIAGNOSIS — Z794 Long term (current) use of insulin: Secondary | ICD-10-CM | POA: Diagnosis not present

## 2020-11-16 DIAGNOSIS — I5033 Acute on chronic diastolic (congestive) heart failure: Secondary | ICD-10-CM | POA: Diagnosis not present

## 2020-11-16 DIAGNOSIS — N1832 Chronic kidney disease, stage 3b: Secondary | ICD-10-CM | POA: Diagnosis not present

## 2020-11-16 DIAGNOSIS — I69354 Hemiplegia and hemiparesis following cerebral infarction affecting left non-dominant side: Secondary | ICD-10-CM | POA: Diagnosis not present

## 2020-11-16 DIAGNOSIS — S90411D Abrasion, right great toe, subsequent encounter: Secondary | ICD-10-CM | POA: Diagnosis not present

## 2020-11-16 DIAGNOSIS — L89892 Pressure ulcer of other site, stage 2: Secondary | ICD-10-CM | POA: Diagnosis not present

## 2020-11-16 DIAGNOSIS — L89323 Pressure ulcer of left buttock, stage 3: Secondary | ICD-10-CM | POA: Diagnosis not present

## 2020-11-16 DIAGNOSIS — Z794 Long term (current) use of insulin: Secondary | ICD-10-CM | POA: Diagnosis not present

## 2020-11-16 DIAGNOSIS — G20 Parkinson's disease: Secondary | ICD-10-CM | POA: Diagnosis not present

## 2020-11-16 DIAGNOSIS — E1122 Type 2 diabetes mellitus with diabetic chronic kidney disease: Secondary | ICD-10-CM | POA: Diagnosis not present

## 2020-11-16 DIAGNOSIS — I130 Hypertensive heart and chronic kidney disease with heart failure and stage 1 through stage 4 chronic kidney disease, or unspecified chronic kidney disease: Secondary | ICD-10-CM | POA: Diagnosis not present

## 2020-11-20 DIAGNOSIS — I69354 Hemiplegia and hemiparesis following cerebral infarction affecting left non-dominant side: Secondary | ICD-10-CM | POA: Diagnosis not present

## 2020-11-20 DIAGNOSIS — N1832 Chronic kidney disease, stage 3b: Secondary | ICD-10-CM | POA: Diagnosis not present

## 2020-11-20 DIAGNOSIS — I130 Hypertensive heart and chronic kidney disease with heart failure and stage 1 through stage 4 chronic kidney disease, or unspecified chronic kidney disease: Secondary | ICD-10-CM | POA: Diagnosis not present

## 2020-11-20 DIAGNOSIS — Z794 Long term (current) use of insulin: Secondary | ICD-10-CM | POA: Diagnosis not present

## 2020-11-20 DIAGNOSIS — G20 Parkinson's disease: Secondary | ICD-10-CM | POA: Diagnosis not present

## 2020-11-20 DIAGNOSIS — L89323 Pressure ulcer of left buttock, stage 3: Secondary | ICD-10-CM | POA: Diagnosis not present

## 2020-11-20 DIAGNOSIS — I5033 Acute on chronic diastolic (congestive) heart failure: Secondary | ICD-10-CM | POA: Diagnosis not present

## 2020-11-20 DIAGNOSIS — E1122 Type 2 diabetes mellitus with diabetic chronic kidney disease: Secondary | ICD-10-CM | POA: Diagnosis not present

## 2020-11-20 DIAGNOSIS — S90411D Abrasion, right great toe, subsequent encounter: Secondary | ICD-10-CM | POA: Diagnosis not present

## 2020-11-20 DIAGNOSIS — L89892 Pressure ulcer of other site, stage 2: Secondary | ICD-10-CM | POA: Diagnosis not present

## 2020-11-22 ENCOUNTER — Encounter (HOSPITAL_BASED_OUTPATIENT_CLINIC_OR_DEPARTMENT_OTHER): Payer: Medicare Other | Attending: Internal Medicine | Admitting: Internal Medicine

## 2020-11-22 ENCOUNTER — Other Ambulatory Visit: Payer: Self-pay

## 2020-11-22 DIAGNOSIS — G20 Parkinson's disease: Secondary | ICD-10-CM | POA: Insufficient documentation

## 2020-11-22 DIAGNOSIS — L0231 Cutaneous abscess of buttock: Secondary | ICD-10-CM | POA: Diagnosis not present

## 2020-11-22 DIAGNOSIS — I5032 Chronic diastolic (congestive) heart failure: Secondary | ICD-10-CM | POA: Diagnosis not present

## 2020-11-22 DIAGNOSIS — I110 Hypertensive heart disease with heart failure: Secondary | ICD-10-CM | POA: Diagnosis not present

## 2020-11-22 DIAGNOSIS — C61 Malignant neoplasm of prostate: Secondary | ICD-10-CM | POA: Diagnosis not present

## 2020-11-22 DIAGNOSIS — Z8673 Personal history of transient ischemic attack (TIA), and cerebral infarction without residual deficits: Secondary | ICD-10-CM | POA: Insufficient documentation

## 2020-11-22 DIAGNOSIS — E1151 Type 2 diabetes mellitus with diabetic peripheral angiopathy without gangrene: Secondary | ICD-10-CM | POA: Insufficient documentation

## 2020-11-22 DIAGNOSIS — L97528 Non-pressure chronic ulcer of other part of left foot with other specified severity: Secondary | ICD-10-CM | POA: Insufficient documentation

## 2020-11-22 DIAGNOSIS — E11621 Type 2 diabetes mellitus with foot ulcer: Secondary | ICD-10-CM | POA: Diagnosis not present

## 2020-11-22 DIAGNOSIS — L97512 Non-pressure chronic ulcer of other part of right foot with fat layer exposed: Secondary | ICD-10-CM | POA: Diagnosis not present

## 2020-11-22 DIAGNOSIS — C7951 Secondary malignant neoplasm of bone: Secondary | ICD-10-CM | POA: Diagnosis not present

## 2020-11-22 DIAGNOSIS — L97522 Non-pressure chronic ulcer of other part of left foot with fat layer exposed: Secondary | ICD-10-CM | POA: Diagnosis not present

## 2020-11-22 DIAGNOSIS — L97514 Non-pressure chronic ulcer of other part of right foot with necrosis of bone: Secondary | ICD-10-CM | POA: Diagnosis not present

## 2020-11-22 DIAGNOSIS — G40909 Epilepsy, unspecified, not intractable, without status epilepticus: Secondary | ICD-10-CM | POA: Insufficient documentation

## 2020-11-22 NOTE — Progress Notes (Signed)
JAKSEN, FIORELLA T ([id number]) . Visit Report for 11/22/2020 HPI Details Patient Name: Date of Service: JERRARD, BRADBURN [facility] T. 11/22/2020 2:30 PM Medical Record Number: [id number] Patient Account Number: [id number] Date of Birth/Sex: Treating RN: 1946/07/09 (75 y.o. [name] Primary Care Provider: [name] Other Clinician: Referring Provider: Treating Provider/Extender: [name] in Treatment: 16 History of Present Illness HPI Description: ADMISSION 07/27/2020 This is a 75 year old man who is accompanied by his wife (ex). Nevertheless she is his active caregiver. His most complicated features are remote CVA that left him with a left hemiparesis. He also has widely metastatic prostate cancer to bone which I think is contributed to increasing frailty this year and he is no longer ambulatory. He required hospitalization from 9/7 through 9/14 with respiratory failure seizures. During this time it was noted that he had an open area in the left buttock in close proximity to the gluteal cleft. His wife tells me that he has a history of perirectal abscesses. And she describes this area as starting as a painful swelling in late August certainly sounds like an abscess. When he is in the hospital he required bedside IandD's but did not go to the OR. Has CT scan of the area showed soft tissue thickening in the perianal ulcer. At that point it was concerning for a fistula connection however a discrete abscess was not seen. Notable that he was hypoalbuminemic in the hospital but actually came out better with an albumin of 3.1 on 9/14 his wife says he is eating well. They are currently treating this with Santyl ointment and a backing wet-to-dry dressing. By description of his wife this is done quite nicely and there is certainly less debris over the wound surface. They already have a hospital bed with a level 3 pressure relief surface. The patient lies on his back when he  is in bed especially at night although his wife is counseled him not to do this. They are starting sliding board transfers into his wheelchair I think with physical therapy. He has a Foley catheter in place Past medical history includes remote CVA, metastatic prostate cancer to large areas of his thoracic and lumbar spine and pelvis., Bilateral carotid artery stenosis, Parkinson's disease, type 2 diabetes, diastolic heart failure, CHF, hypertension and a recently discovered possible pancreatic mass in the head of the pancreas 08/16/2020 patient I admitted to the clinic 2-1/2 weeks ago. He had an abscess site on his left buttock that required an IandD while he was in the hospital in September. He has been using Santyl backing wet-to-dry. He is cared for her aerobically at home by his wife 2 concerning areas on his feet. A stage I pressure injury on the right heel this does not have an open wound. He also has on the lateral aspect of the left fourth toe a dry circular eschared wound. 12/2; monthly follow-up. Is a very disabled man who had an abscess in his left buttock and required an IandD. We have been using Santyl to this area I changed to silver collagen last time he is here and this actually looks quite a bit better The last time he was here his wife showed me an area on the left lateral fourth toe I think a pressure injury with his fifth toe. Been using Santyl here as well but not making much improvement As well she shows me he has an area on his right buttock which is very tiny pinpoint area but under illumination still  obviously an open area. I wonder if this is more of a shear injury and transferring and a pressure injury per se 12/16; this is a very disabled man who has a left buttock area that required an IandD apparently an abscess. Since he has come here that he developed a area on the right buttock. His wife says that this is draining pus. Finally he has an area on the lateral part of the  left fourth toe. We have been using silver collagen on the buttock and Iodoflex on the fourth toe 1/6; this is a disabled man we have been working on a left buttock area that was initially an IandD abscess injury. We have got this down to a small area in fact it appears to be epithelializing towards the base of the wound. When he was here last time he had a small draining area on the right buttock. I thought this might be a small cyst culture of the drainage was negative. I do not think this was an abscess at all While he has been here he has been developing areas on the toes on his left foot. He initially had one on the left fourth toe and then last time the right first toe. He is a type II diabetic. With he has a history of coronary artery disease. He complains of a lot of pain in the right foot 1/13; we have been working on areas on his left buttock and then on the right buttock. Both of these are healed today albeit the area on the left has some depth with skin is gone into a divot. Nevertheless I think this is a reasonable outcome. He arrived a week ago with ischemic looking wounds on his right fifth, right first and his left fourth toe. He went on to have arterial studies which we received today these showed an ABI that was noncompressible on both sides however the great toe on the right had a TBI of 0.14 with a pressure of only 17. Monophasic waveforms on all the tibial vessels and including the popliteal on the left he had monophasic waveforms again noncompressible vessels and no recorded toe pressure. Clearly these are ischemic wounds. Fortunately he is not complaining of a lot of pain The patient has a complicated medical situation which includes widely metastatic prostate cancer to bone for which she is on palliative care he also has Parkinson's disease. I have a recent note from his neurologist at which time he asked whether they wish to de-escalate the Parkinson's regimen and the  answer was no. The patient has a follow-up appointment with Dr. Gwenlyn Found on 1/28. I believe he is the patient's cardiologist but this appointment was made because of the vascular studies I think predominantly through Dr. Kennon Holter office. 2/10; patient went back to see Dr. Gwenlyn Found. He did not feel given his comorbidities that he was a candidate for endovascular therapy. His wife seems to agree with that. He has ischemic wounds on his right toes particularly the right fifth toe fourth toe and first toe. He is not in a lot of pain unless these are manipulated Electronic Signature(s) Signed: 11/22/2020 5:13:40 PM By: Linton Ham MD Entered By: Linton Ham on 11/22/2020 16:12:59 -------------------------------------------------------------------------------- Physical Exam Details Patient Name: Date of Service: [name] 11/22/2020 2:30 PM Medical Record Number: [id number] Patient Account Number: [id number] Date of Birth/Sex: Treating RN: [birth year] (75 y.o. [name] Primary Care Provider: [name] Other Clinician: Referring Provider: Treating Provider/Extender: Tobi Bastos,  Mayford Knife in Treatment: 16 Constitutional Sitting or standing Blood Pressure is within target range for patient.Marland Kitchen Respirations regular, non-labored and within target range.. Temperature is normal and within the target range for the patient.Marland Kitchen Appears in no distress. Notes Wound exam; the area on the buttock that he originally came into our clinic for a both closed. He has ischemic looking areas on the right fifth fourth and first toes. He has a smaller area on the left fourth toe. None of these look infected Electronic Signature(s) Signed: 11/22/2020 5:13:40 PM By: Linton Ham MD Entered By: Linton Ham on 11/22/2020 16:14:37 -------------------------------------------------------------------------------- Physician Orders Details Patient Name: Date of Service: [name]  T. 11/22/2020 2:30 PM Medical Record Number: [id number] Patient Account Number: [id number] Date of Birth/Sex: Treating RN: [birth year] (75 y.o. [name], [name] Primary Care Provider: [name] Other Clinician: Referring Provider: Treating Provider/Extender: [name] in Treatment: 31 Verbal / Phone Orders: No Diagnosis Coding ICD-10 Coding Code Description L02.31 Cutaneous abscess of buttock S31.829D Unspecified open wound of left buttock, subsequent encounter L97.528 Non-pressure chronic ulcer of other part of left foot with other specified severity L97.514 Non-pressure chronic ulcer of other part of right foot with necrosis of bone E11.51 Type 2 diabetes mellitus with diabetic peripheral angiopathy without gangrene Follow-up Appointments Return appointment in 1 month. Off-Loading Low air-loss mattress (Group 2) - CONTINUE TO USE. Turn and reposition every 2 hours Other: - CONTINUE TO USE BUNNY BOOTS FOR HEEL PROTECTION. UP IN WHEELCHAIR NO MORE THAT 2 HOUR INCREMENTS. Ensure to relieve pressure off buttock closed areas to prevent reopening. Home Health No change in wound care orders this week; continue Home Health for wound care. May utilize formulary equivalent dressing for wound treatment orders unless otherwise specified. - [facility] weekly. Wound Treatment Wound #2 - T Fourth oe Wound Laterality: Left, Lateral Cleanser: Wound Cleanser (Home Health) Every Other Day/30 Days Discharge Instructions: Cleanse the wound with wound cleanser prior to applying a clean dressing using gauze sponges, not tissue or cotton balls. Prim Dressing: KerraCel Ag Gelling Fiber Dressing, 2x2 in (silver alginate) (Home Health) Every Other Day/30 Days ary Discharge Instructions: Apply silver alginate to wound bed as instructed Secondary Dressing: Woven Gauze Sponges 2x2 in [facility]) Every Other Day/30 Days Discharge Instructions: Apply over primary  dressing as directed. Secured With: Child psychotherapist, Sterile 2x75 (in/in) (Home Health) Every Other Day/30 Days Discharge Instructions: Secure with stretch gauze as directed. Wound #4 - T Great oe Wound Laterality: Right Cleanser: Wound Cleanser (Home Health) Every Other Day/30 Days Discharge Instructions: Cleanse the wound with wound cleanser prior to applying a clean dressing using gauze sponges, not tissue or cotton balls. Prim Dressing: KerraCel Ag Gelling Fiber Dressing, 2x2 in (silver alginate) (Home Health) Every Other Day/30 Days ary Discharge Instructions: Apply silver alginate to wound bed as instructed Secondary Dressing: Woven Gauze Sponges 2x2 in [facility]) Every Other Day/30 Days Discharge Instructions: Apply over primary dressing as directed. Secured With: Child psychotherapist, Sterile 2x75 (in/in) (Home Health) Every Other Day/30 Days Discharge Instructions: Secure with stretch gauze as directed. Wound #5 - T Fifth oe Wound Laterality: Right Cleanser: Wound Cleanser (Home Health) Every Other Day/30 Days Discharge Instructions: Cleanse the wound with wound cleanser prior to applying a clean dressing using gauze sponges, not tissue or cotton balls. Prim Dressing: KerraCel Ag Gelling Fiber Dressing, 2x2 in (silver alginate) (Home Health) Every Other Day/30 Days ary Discharge Instructions: Apply silver alginate to  wound bed as instructed Secondary Dressing: Woven Gauze Sponges 2x2 in [facility]) Every Other Day/30 Days Discharge Instructions: Apply over primary dressing as directed. Secured With: Child psychotherapist, Sterile 2x75 (in/in) (Home Health) Every Other Day/30 Days Discharge Instructions: Secure with stretch gauze as directed. Electronic Signature(s) Signed: 11/22/2020 5:13:40 PM By: Linton Ham MD Signed: 11/22/2020 6:08:01 PM By: Deon Pilling Entered By: Deon Pilling on 11/22/2020  15:35:57 -------------------------------------------------------------------------------- Problem List Details Patient Name: Date of Service: [name] 11/22/2020 2:30 PM Medical Record Number: [id number] Patient Account Number: [id number] Date of Birth/Sex: Treating RN: 1946/03/30 (75 y.o. [name] Primary Care Provider: [name] Other Clinician: Referring Provider: Treating Provider/Extender: [name] in Treatment: 16 Active Problems ICD-10 Encounter Code Description Active Date MDM Diagnosis L02.31 Cutaneous abscess of buttock 07/27/2020 No Yes S31.829D Unspecified open wound of left buttock, subsequent encounter 07/27/2020 No Yes L97.528 Non-pressure chronic ulcer of other part of left foot with other specified 08/16/2020 No Yes severity L97.514 Non-pressure chronic ulcer of other part of right foot with necrosis of bone 10/18/2020 No Yes E11.51 Type 2 diabetes mellitus with diabetic peripheral angiopathy without gangrene 10/18/2020 No Yes Inactive Problems ICD-10 Code Description Active Date Inactive Date L89.611 Pressure ulcer of right heel, stage 1 08/16/2020 08/16/2020 L89.312 Pressure ulcer of right buttock, stage 2 09/13/2020 09/13/2020 Resolved Problems Electronic Signature(s) Signed: 11/22/2020 5:13:40 PM By: Linton Ham MD Entered By: Linton Ham on 11/22/2020 16:09:55 -------------------------------------------------------------------------------- Progress Note Details Patient Name: Date of Service: [name] 11/22/2020 2:30 PM Medical Record Number: [id number] Patient Account Number: [id number] Date of Birth/Sex: Treating RN: 1946/04/04 (75 y.o. [name] Primary Care Provider: [name] Other Clinician: Referring Provider: Treating Provider/Extender: [name] in Treatment: 16 Subjective History of Present Illness (HPI) ADMISSION 07/27/2020 This is a  75 year old man who is accompanied by his wife (ex). Nevertheless she is his active caregiver. His most complicated features are remote CVA that left him with a left hemiparesis. He also has widely metastatic prostate cancer to bone which I think is contributed to increasing frailty this year and he is no longer ambulatory. He required hospitalization from 9/7 through 9/14 with respiratory failure seizures. During this time it was noted that he had an open area in the left buttock in close proximity to the gluteal cleft. His wife tells me that he has a history of perirectal abscesses. And she describes this area as starting as a painful swelling in late August certainly sounds like an abscess. When he is in the hospital he required bedside IandD's but did not go to the OR. Has CT scan of the area showed soft tissue thickening in the perianal ulcer. At that point it was concerning for a fistula connection however a discrete abscess was not seen. Notable that he was hypoalbuminemic in the hospital but actually came out better with an albumin of 3.1 on 9/14 his wife says he is eating well. They are currently treating this with Santyl ointment and a backing wet-to-dry dressing. By description of his wife this is done quite nicely and there is certainly less debris over the wound surface. They already have a hospital bed with a level 3 pressure relief surface. The patient lies on his back when he is in bed especially at night although his wife is counseled him not to do this. They are starting sliding board transfers into his wheelchair I think with physical therapy. He has a  Foley catheter in place Past medical history includes remote CVA, metastatic prostate cancer to large areas of his thoracic and lumbar spine and pelvis., Bilateral carotid artery stenosis, Parkinson's disease, type 2 diabetes, diastolic heart failure, CHF, hypertension and a recently discovered possible pancreatic mass in the head  of the pancreas 08/16/2020 patient I admitted to the clinic 2-1/2 weeks ago. He had an abscess site on his left buttock that required an IandD while he was in the hospital in September. He has been using Santyl backing wet-to-dry. He is cared for her aerobically at home by his wife 2 concerning areas on his feet. A stage I pressure injury on the right heel this does not have an open wound. He also has on the lateral aspect of the left fourth toe a dry circular eschared wound. 12/2; monthly follow-up. Is a very disabled man who had an abscess in his left buttock and required an IandD. We have been using Santyl to this area I changed to silver collagen last time he is here and this actually looks quite a bit better ooThe last time he was here his wife showed me an area on the left lateral fourth toe I think a pressure injury with his fifth toe. Been using Santyl here as well but not making much improvement ooAs well she shows me he has an area on his right buttock which is very tiny pinpoint area but under illumination still obviously an open area. I wonder if this is more of a shear injury and transferring and a pressure injury per se 12/16; this is a very disabled man who has a left buttock area that required an IandD apparently an abscess. Since he has come here that he developed a area on the right buttock. His wife says that this is draining pus. Finally he has an area on the lateral part of the left fourth toe. We have been using silver collagen on the buttock and Iodoflex on the fourth toe 1/6; this is a disabled man we have been working on a left buttock area that was initially an IandD abscess injury. We have got this down to a small area in fact it appears to be epithelializing towards the base of the wound. When he was here last time he had a small draining area on the right buttock. I thought this might be a small cyst culture of the drainage was negative. I do not think this was an  abscess at all While he has been here he has been developing areas on the toes on his left foot. He initially had one on the left fourth toe and then last time the right first toe. He is a type II diabetic. With he has a history of coronary artery disease. He complains of a lot of pain in the right foot 1/13; we have been working on areas on his left buttock and then on the right buttock. Both of these are healed today albeit the area on the left has some depth with skin is gone into a divot. Nevertheless I think this is a reasonable outcome. He arrived a week ago with ischemic looking wounds on his right fifth, right first and his left fourth toe. He went on to have arterial studies which we received today these showed an ABI that was noncompressible on both sides however the great toe on the right had a TBI of 0.14 with a pressure of only 17. Monophasic waveforms on all the tibial vessels and including  the popliteal on the left he had monophasic waveforms again noncompressible vessels and no recorded toe pressure. Clearly these are ischemic wounds. Fortunately he is not complaining of a lot of pain The patient has a complicated medical situation which includes widely metastatic prostate cancer to bone for which she is on palliative care he also has Parkinson's disease. I have a recent note from his neurologist at which time he asked whether they wish to de-escalate the Parkinson's regimen and the answer was no. The patient has a follow-up appointment with Dr. Gwenlyn Found on 1/28. I believe he is the patient's cardiologist but this appointment was made because of the vascular studies I think predominantly through Dr. Kennon Holter office. 2/10; patient went back to see Dr. Gwenlyn Found. He did not feel given his comorbidities that he was a candidate for endovascular therapy. His wife seems to agree with that. He has ischemic wounds on his right toes particularly the right fifth toe fourth toe and first toe. He is not  in a lot of pain unless these are manipulated Objective Constitutional Sitting or standing Blood Pressure is within target range for patient.Marland Kitchen Respirations regular, non-labored and within target range.. Temperature is normal and within the target range for the patient.Marland Kitchen Appears in no distress. Vitals Time Taken: 3:07 PM, Height: 64 in, Weight: 150 lbs, BMI: 25.7, Temperature: 98.0 F, Pulse: 73 bpm, Respiratory Rate: 15 breaths/min, Blood Pressure: 128/72 mmHg. General Notes: Wound exam; the area on the buttock that he originally came into our clinic for a both closed. He has ischemic looking areas on the right fifth fourth and first toes. He has a smaller area on the left fourth toe. None of these look infected Integumentary (Hair, Skin) Wound #2 status is Open. Original cause of wound was Gradually Appeared. The wound is located on the Left,Lateral T Fourth. The wound measures 0.2cm oe length x 0.2cm width x 0.1cm depth; 0.031cm^2 area and 0.003cm^3 volume. There is no tunneling or undermining noted. There is a none present amount of drainage noted. The wound margin is flat and intact. There is no granulation within the wound bed. There is a large (67-100%) amount of necrotic tissue within the wound bed including Eschar. Wound #4 status is Open. Original cause of wound was Not Known. The wound is located on the Right T Great. The wound measures 0.4cm length x 0.9cm oe width x 0.2cm depth; 0.283cm^2 area and 0.057cm^3 volume. There is bone and Fat Layer (Subcutaneous Tissue) exposed. There is no tunneling or undermining noted. There is a none present amount of drainage noted. The wound margin is flat and intact. There is no granulation within the wound bed. There is a large (67-100%) amount of necrotic tissue within the wound bed including Eschar. Wound #5 status is Open. Original cause of wound was Not Known. The wound is located on the Right T Fifth. The wound measures 1.5cm length x  1.5cm oe width x 0.1cm depth; 1.767cm^2 area and 0.177cm^3 volume. There is Fat Layer (Subcutaneous Tissue) exposed. There is no tunneling or undermining noted. There is a medium amount of serosanguineous drainage noted. The wound margin is flat and intact. There is no granulation within the wound bed. There is a large (67-100%) amount of necrotic tissue within the wound bed including Eschar. Wound #6 status is Open. Original cause of wound was Gradually Appeared. The wound is located on the Right T Fourth. The wound measures 0.4cm length oe x 1.5cm width x 0.1cm depth; 0.471cm^2 area and 0.047cm^3  volume. There is Fat Layer (Subcutaneous Tissue) exposed. There is no tunneling or undermining noted. There is a medium amount of serosanguineous drainage noted. The wound margin is distinct with the outline attached to the wound base. There is medium (34-66%) red granulation within the wound bed. There is a medium (34-66%) amount of necrotic tissue within the wound bed including Eschar. 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 Off-Loading: Low air-loss mattress (Group 2) - CONTINUE TO USE. Turn and reposition every 2 hours Other: - CONTINUE TO USE BUNNY BOOTS FOR HEEL PROTECTION. UP IN WHEELCHAIR NO MORE THAT 2 HOUR INCREMENTS. Ensure to relieve pressure off buttock closed areas to prevent reopening. Home Health: No change in wound care orders this week; continue Home Health for wound care. May utilize formulary equivalent dressing for wound treatment orders unless otherwise specified. - [facility]  weekly. WOUND #2: - T Fourth Wound Laterality: Left, Lateral oe Cleanser: Wound Cleanser (Home Health) Every Other Day/30 Days Discharge Instructions: Cleanse the wound with wound cleanser prior to applying a clean dressing using gauze sponges, not tissue or cotton balls. Prim Dressing: KerraCel Ag Gelling Fiber Dressing, 2x2 in (silver alginate) (Home Health) Every Other Day/30 Days ary Discharge Instructions: Apply silver alginate to wound bed as instructed Secondary Dressing: Woven Gauze Sponges 2x2 in [facility]) Every Other Day/30 Days Discharge Instructions: Apply over primary dressing as directed. Secured With: Child psychotherapist, Sterile 2x75 (in/in) (Home Health) Every Other Day/30 Days Discharge Instructions: Secure with stretch gauze as directed. WOUND #4: - T Great Wound Laterality: Right oe Cleanser: Wound Cleanser (Home Health) Every Other Day/30 Days Discharge Instructions: Cleanse the wound with wound cleanser prior to applying a clean dressing using gauze sponges, not tissue or cotton balls. Prim Dressing: KerraCel Ag Gelling Fiber Dressing, 2x2 in (silver alginate) (Home Health) Every Other Day/30 Days ary Discharge Instructions: Apply silver alginate to wound bed as instructed Secondary Dressing: Woven Gauze Sponges 2x2 in [facility]) Every Other Day/30 Days Discharge Instructions: Apply over primary dressing as directed. Secured With: Child psychotherapist, Sterile 2x75 (in/in) (Home Health) Every Other Day/30 Days Discharge Instructions: Secure with stretch gauze as directed. WOUND #5: - T Fifth Wound Laterality: Right oe Cleanser: Wound Cleanser (Home Health) Every Other Day/30 Days Discharge Instructions: Cleanse the wound with wound cleanser prior to applying a clean dressing using gauze sponges, not tissue or cotton balls. Prim Dressing: KerraCel Ag Gelling Fiber Dressing, 2x2 in (silver alginate) (Home Health) Every Other Day/30  Days ary Discharge Instructions: Apply silver alginate to wound bed as instructed Secondary Dressing: Woven Gauze Sponges 2x2 in [facility]) Every Other Day/30 Days Discharge Instructions: Apply over primary dressing as directed. Secured With: Child psychotherapist, Sterile 2x75 (in/in) (Home Health) Every Other Day/30 Days Discharge Instructions: Secure with stretch gauze as directed. 1. I think we will use silver alginate to these wounds on the on the toes. This helps keep things dry except drainage and is antibacterial 2. These are ischemic wounds, is not felt to be candidate for an angiogram or endovascular therapy. His wife seems to agree with this approach 3. I told her that the ischemia in the  toes is likely to be progressive gradually over time Electronic Signature(s) Signed: 11/22/2020 5:13:40 PM By: Linton Ham MD Entered By: Linton Ham on 11/22/2020 16:16:01 -------------------------------------------------------------------------------- SuperBill Details Patient Name: Date of Service: Alycia Patten T. 11/22/2020 Medical Record Number: [id number] Patient Account Number: [id number] Date of Birth/Sex: Treating RN: 1946-07-29 (75 y.o. 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 E11.51 Type 2 diabetes mellitus with diabetic peripheral angiopathy without gangrene Facility Procedures CPT4 Code: [id number] Description: [id number] - WOUND CARE VISIT-LEV 5 EST PT Modifier: Quantity: 1 Physician Procedures :  CPT4 Code Description Modifier [id number] - WC PHYS LEVEL 3 - EST PT ICD-10 Diagnosis Description E11.51 Type 2 diabetes mellitus with diabetic peripheral angiopathy without gangrene L97.528 Non-pressure chronic ulcer of other part of left foot with  other specified severity L97.514 Non-pressure chronic ulcer of other part of right foot with necrosis of bone Quantity: 1 Electronic Signature(s) Signed: 11/22/2020 5:13:40 PM By: Linton Ham MD Entered By: Linton Ham on 11/22/2020 16:21:05

## 2020-11-23 DIAGNOSIS — I5033 Acute on chronic diastolic (congestive) heart failure: Secondary | ICD-10-CM | POA: Diagnosis not present

## 2020-11-23 DIAGNOSIS — L89323 Pressure ulcer of left buttock, stage 3: Secondary | ICD-10-CM | POA: Diagnosis not present

## 2020-11-23 DIAGNOSIS — S90411D Abrasion, right great toe, subsequent encounter: Secondary | ICD-10-CM | POA: Diagnosis not present

## 2020-11-23 DIAGNOSIS — N1832 Chronic kidney disease, stage 3b: Secondary | ICD-10-CM | POA: Diagnosis not present

## 2020-11-23 DIAGNOSIS — E1122 Type 2 diabetes mellitus with diabetic chronic kidney disease: Secondary | ICD-10-CM | POA: Diagnosis not present

## 2020-11-23 DIAGNOSIS — L89892 Pressure ulcer of other site, stage 2: Secondary | ICD-10-CM | POA: Diagnosis not present

## 2020-11-23 DIAGNOSIS — G20 Parkinson's disease: Secondary | ICD-10-CM | POA: Diagnosis not present

## 2020-11-23 DIAGNOSIS — I130 Hypertensive heart and chronic kidney disease with heart failure and stage 1 through stage 4 chronic kidney disease, or unspecified chronic kidney disease: Secondary | ICD-10-CM | POA: Diagnosis not present

## 2020-11-23 DIAGNOSIS — Z794 Long term (current) use of insulin: Secondary | ICD-10-CM | POA: Diagnosis not present

## 2020-11-23 DIAGNOSIS — I69354 Hemiplegia and hemiparesis following cerebral infarction affecting left non-dominant side: Secondary | ICD-10-CM | POA: Diagnosis not present

## 2020-11-26 NOTE — Progress Notes (Signed)
[name], [name] ([id number]) . Visit Report for 11/22/2020 Arrival Information Details Patient Name: Date of Service: [name], [name] [facility] [name]. 11/22/2020 2:30 PM Medical Record Number: [id number] Patient Account Number: [id number] Date of Birth/Sex: Treating RN: [birth year] (75 y.o. [name], [name] Primary Care Reeva Davern: [name] Other Clinician: Referring Kimari Lienhard: Treating Siyana Erney/Extender: [name] in Treatment: 14 Visit Information History Since Last Visit Added or deleted any medications: No Patient Arrived: Wheel Chair Any new allergies or adverse reactions: No Arrival Time: 15:05 Had a fall or experienced change in No Accompanied By: wife activities of daily living that may affect Transfer Assistance: Transfer Board risk of falls: Patient Identification Verified: Yes Signs or symptoms of abuse/neglect since last visito No Secondary Verification Process Completed: Yes Hospitalized since last visit: No Patient Requires Transmission-Based Precautions: No Implantable device outside of the clinic excluding No Patient Has Alerts: Yes cellular tissue based products placed in the center Patient Alerts: Patient on Blood Thinner since last visit: Has Dressing in Place as Prescribed: Yes Pain Present Now: No Electronic Signature(s) Signed: 11/23/2020 1:25:35 PM By: Sandre Kitty Entered By: Sandre Kitty on 11/22/2020 15:07:41 -------------------------------------------------------------------------------- Clinic Level of Care Assessment Details Patient Name: Date of Service: [name] [facility] [name]. 11/22/2020 2:30 PM Medical Record Number: [id number] Patient Account Number: [id number] Date of Birth/Sex: Treating RN: June 21, 1946 (75 y.o. [name], [name] Primary Care Jaziya Obarr: [name] Other Clinician: Referring Levone Otten: Treating Corderro Koloski/Extender: [name] in Treatment: 16 Clinic Level of Care Assessment  Items TOOL 4 Quantity Score X- 1 0 Use when only an EandM is performed on FOLLOW-UP visit ASSESSMENTS - Nursing Assessment / Reassessment X- 1 10 Reassessment of Co-morbidities (includes updates in patient status) X- 1 5 Reassessment of Adherence to Treatment Plan ASSESSMENTS - Wound and Skin A ssessment / Reassessment []  - 0 Simple Wound Assessment / Reassessment - one wound X- 4 5 Complex Wound Assessment / Reassessment - multiple wounds X- 1 10 Dermatologic / Skin Assessment (not related to wound area) ASSESSMENTS - Focused Assessment X- 1 5 Circumferential Edema Measurements - multi extremities X- 1 10 Nutritional Assessment / Counseling / Intervention []  - 0 Lower Extremity Assessment (monofilament, tuning fork, pulses) []  - 0 Peripheral Arterial Disease Assessment (using hand held doppler) ASSESSMENTS - Ostomy and/or Continence Assessment and Care []  - 0 Incontinence Assessment and Management []  - 0 Ostomy Care Assessment and Management (repouching, etc.) PROCESS - Coordination of Care []  - 0 Simple Patient / Family Education for ongoing care X- 1 20 Complex (extensive) Patient / Family Education for ongoing care X- 1 10 Staff obtains Consents, Records, [name] Results / Process Orders est X- 1 10 Staff telephones HHA, Nursing Homes / Clarify orders / etc []  - 0 Routine Transfer to another Facility (non-emergent condition) []  - 0 Routine Hospital Admission (non-emergent condition) []  - 0 New Admissions / Biomedical engineer / Ordering NPWT Apligraf, etc. , []  - 0 Emergency Hospital Admission (emergent condition) []  - 0 Simple Discharge Coordination X- 1 15 Complex (extensive) Discharge Coordination PROCESS - Special Needs []  - 0 Pediatric / Minor Patient Management []  - 0 Isolation Patient Management []  - 0 Hearing / Language / Visual special needs []  - 0 Assessment of Community assistance (transportation, D/C planning, etc.) []  - 0 Additional  assistance / Altered mentation []  - 0 Support Surface(s) Assessment (bed, cushion, seat, etc.) INTERVENTIONS - Wound Cleansing / Measurement []  - 0 Simple Wound Cleansing - one wound X- 4 5 Complex Wound  Cleansing - multiple wounds X- 1 5 Wound Imaging (photographs - any number of wounds) $RemoveBe'[]'SmFuNZcXR$  - 0 Wound Tracing (instead of photographs) $RemoveBeforeD'[]'LRvkWBuUeESAKC$  - 0 Simple Wound Measurement - one wound X- 4 5 Complex Wound Measurement - multiple wounds INTERVENTIONS - Wound Dressings X - Small Wound Dressing one or multiple wounds 4 10 $Re'[]'hPZ$  - 0 Medium Wound Dressing one or multiple wounds $RemoveBeforeD'[]'EbXfwTvZZEauFJ$  - 0 Large Wound Dressing one or multiple wounds $RemoveBeforeD'[]'WhYtkLKOIfxYfm$  - 0 Application of Medications - topical $RemoveB'[]'ntzyWWql$  - 0 Application of Medications - injection INTERVENTIONS - Miscellaneous $RemoveBeforeD'[]'AbMLRShjYVlzwq$  - 0 External ear exam $Remove'[]'zEoJOTW$  - 0 Specimen Collection (cultures, biopsies, blood, body fluids, etc.) $RemoveBefor'[]'MEjpPRvyonwL$  - 0 Specimen(s) / Culture(s) sent or taken to Lab for analysis $RemoveBefo'[]'DorMpsIKreb$  - 0 Patient Transfer (multiple staff / Civil Service fast streamer / Similar devices) $RemoveBeforeDE'[]'xeSQbtQaYOHShQC$  - 0 Simple Staple / Suture removal (25 or less) $Remove'[]'QcFWhli$  - 0 Complex Staple / Suture removal (26 or more) $Remove'[]'iiPommj$  - 0 Hypo / Hyperglycemic Management (close monitor of Blood Glucose) $RemoveBefore'[]'gAlCAbMxFJLmE$  - 0 Ankle / Brachial Index (ABI) - do not check if billed separately X- 1 5 Vital Signs Has the patient been seen at the hospital within the last three years: Yes Total Score: 205 Level Of Care: New/Established - Level 5 Electronic Signature(s) Signed: 11/22/2020 6:08:01 PM By: Deon Pilling Entered By: Deon Pilling on 11/22/2020 15:37:08 -------------------------------------------------------------------------------- Complex / Palliative Patient Assessment Details Patient Name: Date of Service: [name] [name]. 11/22/2020 2:30 PM Medical Record Number: [id number] Patient Account Number: [id number] Date of Birth/Sex: Treating RN: 06-08-1946 (75 y.o. [name] Primary Care Amritpal Shropshire: [name] Other  Clinician: Referring Sanad Fearnow: Treating Chadrick Sprinkle/Extender: [name] in Treatment: 16 Palliative Management Criteria Complex Wound Management Criteria Patient has remarkable or complex co-morbidities requiring medications or treatments that extend wound healing times. Examples: Diabetes mellitus with chronic renal failure or end stage renal disease requiring dialysis Advanced or poorly controlled rheumatoid arthritis Diabetes mellitus and end stage chronic obstructive pulmonary disease Active cancer with current chemo- or radiation therapy Type 2 Diabetes, PAD, CHF, Parkinson's, Prostate cancer with bone mets Care Approach Wound Care Plan: Complex Wound Management Electronic Signature(s) Signed: 11/23/2020 1:59:22 PM By: Linton Ham MD Signed: 11/26/2020 5:50:50 PM By: Levan Hurst RN, BSN Entered By: Levan Hurst on 11/23/2020 11:49:42 -------------------------------------------------------------------------------- Encounter Discharge Information Details Patient Name: Date of Service: [name] [name]. 11/22/2020 2:30 PM Medical Record Number: [id number] Patient Account Number: [id number] Date of Birth/Sex: Treating RN: 08-05-1946 (75 y.o. Ernestene Mention Primary Care Sandeep Radell: [name] Other Clinician: Referring Mihail Prettyman: Treating Jhade Berko/Extender: [name] in Treatment: 16 Encounter Discharge Information Items Discharge Condition: Stable Ambulatory Status: Wheelchair Discharge Destination: Home Transportation: Private Auto Accompanied By: spouse Schedule Follow-up Appointment: Yes Clinical Summary of Care: Patient Declined Electronic Signature(s) Signed: 11/22/2020 5:55:42 PM By: Baruch Gouty RN, BSN Entered By: Baruch Gouty on 11/22/2020 16:04:34 -------------------------------------------------------------------------------- Lower Extremity Assessment Details Patient Name: Date of  Service: [name] [name]. 11/22/2020 2:30 PM Medical Record Number: [id number] Patient Account Number: [id number] Date of Birth/Sex: Treating RN: 27-Feb-1946 (75 y.o. [name] Primary Care Marcquis Ridlon: [name] Other Clinician: Referring Keyri Salberg: Treating Gerlad Pelzel/Extender: [name] in Treatment: 16 Edema Assessment Assessed: Shirlyn Goltz: No] Patrice Paradise: Yes] Edema: [Left: N] [Right: o] Calf Left: Right: Point of Measurement: From Medial Instep 26 cm Ankle Left: Right: Point of Measurement: From Medial Instep 16 cm Electronic Signature(s) Signed: 11/22/2020 6:08:01 PM By: Deon Pilling Entered By: Deon Pilling on  11/22/2020 15:22:17 -------------------------------------------------------------------------------- Multi Wound Chart Details Patient Name: Date of Service: MACKLEN, WILHOITE [facility] [name]. 11/22/2020 2:30 PM Medical Record Number: [id number] Patient Account Number: [id number] Date of Birth/Sex: Treating RN: 1946-07-06 (75 y.o. [name], [name] Primary Care Toi Stelly: [name] Other Clinician: Referring Knight Oelkers: Treating Esmee Fallaw/Extender: [name] in Treatment: 16 Vital Signs Height(in): 64 Pulse(bpm): 64 Weight(lbs): 150 Blood Pressure(mmHg): 128/72 Body Mass Index(BMI): 26 Temperature(F): 98.0 Respiratory Rate(breaths/min): 15 Photos: [2:No Photos Left, Lateral [name] Fourth oe] [4:No Photos Right [name] Great oe] [5:No Photos Right [name] Fifth oe] Wound Location: [2:Gradually Appeared] [4:Not Known] [5:Not Known] Wounding Event: [2:Diabetic Wound/Ulcer of the Lower] [4:Diabetic Wound/Ulcer of the Lower] [5:Diabetic Wound/Ulcer of the Lower] Primary Etiology: [2:Extremity Congestive Heart Failure, Coronary] [4:Extremity Congestive Heart Failure, Coronary] [5:Extremity Congestive Heart Failure, Coronary] Comorbid History: [2:Artery Disease, Hypertension, Type II Diabetes, Seizure Disorder 08/16/2020] [4:Artery Disease,  Hypertension, Type II Diabetes, Seizure Disorder 10/11/2020] [5:Artery Disease, Hypertension, Type II Diabetes, Seizure Disorder  10/11/2020] Date Acquired: [2:14] [4:5] [5:5] Weeks of Treatment: [2:Open] [4:Open] [5:Open] Wound Status: [2:0.2x0.2x0.1] [4:0.4x0.9x0.2] [5:1.5x1.5x0.1] Measurements L x W x D (cm) [2:0.031] [4:0.283] [5:1.767] A (cm) : rea [2:0.003] [4:0.057] [5:0.177] Volume (cm) : [2:75.40%] [4:74.30%] [5:-349.60%] % Reduction in A rea: [2:76.90%] [4:48.20%] [5:-353.80%] % Reduction in Volume: [2:Grade 2] [4:Grade 2] [5:Grade 2] Classification: [2:None Present] [4:None Present] [5:Medium] Exudate A mount: [2:N/A] [4:N/A] [5:Serosanguineous] Exudate Type: [2:N/A] [4:N/A] [5:red, brown] Exudate Color: [2:Flat and Intact] [4:Flat and Intact] [5:Flat and Intact] Wound Margin: [2:None Present (0%)] [4:None Present (0%)] [5:None Present (0%)] Granulation A mount: [2:N/A] [4:N/A] [5:N/A] Granulation Quality: [2:Large (67-100%)] [4:Large (67-100%)] [5:Large (67-100%)] Necrotic A mount: [2:Eschar] [4:Eschar] [5:Eschar] Necrotic Tissue: [2:Fascia: No] [4:Fat Layer (Subcutaneous Tissue): Yes Fat Layer (Subcutaneous Tissue): Yes] Exposed Structures: [2:Fat Layer (Subcutaneous Tissue): No Tendon: No Muscle: No Joint: No Bone: No Large (67-100%)] [4:Bone: Yes Fascia: No Tendon: No Muscle: No Joint: No None] [5:Fascia: No Tendon: No Muscle: No Joint: No Bone: No None] Wound Number: 6 N/A N/A Photos: No Photos N/A N/A Right [name] Fourth oe N/A N/A Wound Location: Gradually Appeared N/A N/A Wounding Event: Diabetic Wound/Ulcer of the Lower N/A N/A Primary Etiology: Extremity Congestive Heart Failure, Coronary N/A N/A Comorbid History: Artery Disease, Hypertension, Type II Diabetes, Seizure Disorder 11/22/2020 N/A N/A Date Acquired: 0 N/A N/A Weeks of Treatment: Open N/A N/A Wound Status: 0.4x1.5x0.1 N/A N/A Measurements L x W x D (cm) 0.471 N/A N/A A (cm) : rea 0.047 N/A  N/A Volume (cm) : 0.00% N/A N/A % Reduction in A rea: 0.00% N/A N/A % Reduction in Volume: Grade 2 N/A N/A Classification: Medium N/A N/A Exudate A mount: Serosanguineous N/A N/A Exudate Type: red, brown N/A N/A Exudate Color: Distinct, outline attached N/A N/A Wound Margin: Medium (34-66%) N/A N/A Granulation A mount: Red N/A N/A Granulation Quality: Medium (34-66%) N/A N/A Necrotic A mount: Eschar N/A N/A Necrotic Tissue: Fat Layer (Subcutaneous Tissue): Yes N/A N/A Exposed Structures: Fascia: No Tendon: No Muscle: No Joint: No Bone: No Small (1-33%) N/A N/A Epithelialization: Treatment Notes Wound #2 (Toe Fourth) Wound Laterality: Left, Lateral Cleanser Wound Cleanser Discharge Instruction: Cleanse the wound with wound cleanser prior to applying a clean dressing using gauze sponges, not tissue or cotton balls. Peri-Wound Care Topical Primary Dressing KerraCel Ag Gelling Fiber Dressing, 2x2 in (silver alginate) Discharge Instruction: Apply silver alginate to wound bed as instructed Secondary Dressing Woven Gauze Sponges 2x2 in Discharge Instruction: Apply over primary dressing as directed. Secured With Psychologist, occupational  Gauze Bandage, Sterile 2x75 (in/in) Discharge Instruction: Secure with stretch gauze as directed. Compression Wrap Compression Stockings Add-Ons Wound #4 (Toe Great) Wound Laterality: Right Cleanser Wound Cleanser Discharge Instruction: Cleanse the wound with wound cleanser prior to applying a clean dressing using gauze sponges, not tissue or cotton balls. Peri-Wound Care Topical Primary Dressing KerraCel Ag Gelling Fiber Dressing, 2x2 in (silver alginate) Discharge Instruction: Apply silver alginate to wound bed as instructed Secondary Dressing Woven Gauze Sponges 2x2 in Discharge Instruction: Apply over primary dressing as directed. Secured With Conforming Stretch Gauze Bandage, Sterile 2x75 (in/in) Discharge Instruction: Secure  with stretch gauze as directed. Compression Wrap Compression Stockings Add-Ons Wound #5 (Toe Fifth) Wound Laterality: Right Cleanser Wound Cleanser Discharge Instruction: Cleanse the wound with wound cleanser prior to applying a clean dressing using gauze sponges, not tissue or cotton balls. Peri-Wound Care Topical Primary Dressing KerraCel Ag Gelling Fiber Dressing, 2x2 in (silver alginate) Discharge Instruction: Apply silver alginate to wound bed as instructed Secondary Dressing Woven Gauze Sponges 2x2 in Discharge Instruction: Apply over primary dressing as directed. Secured With Conforming Stretch Gauze Bandage, Sterile 2x75 (in/in) Discharge Instruction: Secure with stretch gauze as directed. Compression Wrap Compression Stockings Add-Ons Wound #6 (Toe Fourth) Wound Laterality: Right Cleanser Peri-Wound Care Topical Primary Dressing Secondary Dressing Secured With Compression Wrap Compression Stockings Add-Ons Electronic Signature(s) Signed: 11/22/2020 5:13:40 PM By: Linton Ham MD Signed: 11/22/2020 6:08:01 PM By: Deon Pilling Entered By: Linton Ham on 11/22/2020 16:10:22 -------------------------------------------------------------------------------- Multi-Disciplinary Care Plan Details Patient Name: Date of Service: [name] [name]. 11/22/2020 2:30 PM Medical Record Number: [id number] Patient Account Number: [id number] Date of Birth/Sex: Treating RN: [birth year] (75 y.o. [name] Primary Care Rajesh Wyss: [name] Other Clinician: Referring Shawana Knoch: Treating Raiyah Speakman/Extender: [name] in Treatment: 16 Active Inactive Wound/Skin Impairment Nursing Diagnoses: Impaired tissue integrity Knowledge deficit related to ulceration/compromised skin integrity Goals: Patient/caregiver will verbalize understanding of skin care regimen Date Initiated: 07/27/2020 Target Resolution Date: 01/11/2021 Goal Status:  Active Ulcer/skin breakdown will have a volume reduction of 30% by week 4 Date Initiated: 07/27/2020 Date Inactivated: 09/13/2020 Target Resolution Date: 08/24/2020 Goal Status: Met Interventions: Assess patient/caregiver ability to obtain necessary supplies Assess patient/caregiver ability to perform ulcer/skin care regimen upon admission and as needed Assess ulceration(s) every visit Provide education on ulcer and skin care Treatment Activities: Skin care regimen initiated : 07/27/2020 Topical wound management initiated : 07/27/2020 Notes: Electronic Signature(s) Signed: 11/22/2020 6:08:01 PM By: Deon Pilling Entered By: Deon Pilling on 11/22/2020 14:32:39 -------------------------------------------------------------------------------- Pain Assessment Details Patient Name: Date of Service: [name] [name]. 11/22/2020 2:30 PM Medical Record Number: [id number] Patient Account Number: [id number] Date of Birth/Sex: Treating RN: Sep 19, 1946 (75 y.o. [name] Primary Care Chikita Dogan: [name] Other Clinician: Referring Briea Mcenery: Treating Dabney Dever/Extender: [name] in Treatment: 16 Active Problems Location of Pain Severity and Description of Pain Patient Has Paino No Site Locations Pain Management and Medication Current Pain Management: Electronic Signature(s) Signed: 11/22/2020 6:08:01 PM By: Deon Pilling Signed: 11/23/2020 1:25:35 PM By: Sandre Kitty Entered By: Sandre Kitty on 11/22/2020 15:08:05 -------------------------------------------------------------------------------- Patient/Caregiver Education Details Patient Name: Date of Service: Julieta Gutting 2/10/2022andnbsp2:30 PM Medical Record Number: [id number] Patient Account Number: [id number] Date of Birth/Gender: Treating RN: Jul 07, 1946 (75 y.o. [name] Primary Care Physician: [name] Other Clinician: Referring Physician: Treating  Physician/Extender: [name] in Treatment: 16 Education Assessment Education Provided To: Patient Education Topics Provided Wound/Skin Impairment: Handouts: Skin Care Do's and  Dont's Methods: Explain/Verbal Responses: Reinforcements needed Electronic Signature(s) Signed: 11/22/2020 6:08:01 PM By: Deon Pilling Entered By: Deon Pilling on 11/22/2020 14:32:52 -------------------------------------------------------------------------------- Wound Assessment Details Patient Name: Date of Service: [name] [name]. 11/22/2020 2:30 PM Medical Record Number: [id number] Patient Account Number: [id number] Date of Birth/Sex: Treating RN: 12-Oct-1946 (75 y.o. [name], [name] Primary Care Taeshawn Helfman: [name] Other Clinician: Referring Corlis Angelica: Treating Caide Campi/Extender: [name] in Treatment: 16 Wound Status Wound Number: 2 Primary Diabetic Wound/Ulcer of the Lower Extremity Etiology: Wound Location: Left, Lateral [name] Fourth oe Wound Open Wounding Event: Gradually Appeared Status: Date Acquired: 08/16/2020 Comorbid Congestive Heart Failure, Coronary Artery Disease, Weeks Of Treatment: 14 History: Hypertension, Type II Diabetes, Seizure Disorder Clustered Wound: No Photos Photo Uploaded By: Mikeal Hawthorne on 11/26/2020 15:06:48 Wound Measurements Length: (cm) 0.2 Width: (cm) 0.2 Depth: (cm) 0.1 Area: (cm) 0.031 Volume: (cm) 0.003 % Reduction in Area: 75.4% % Reduction in Volume: 76.9% Epithelialization: Large (67-100%) Tunneling: No Undermining: No Wound Description Classification: Grade 2 Wound Margin: Flat and Intact Exudate Amount: None Present Foul Odor After Cleansing: No Slough/Fibrino Yes Wound Bed Granulation Amount: None Present (0%) Exposed Structure Necrotic Amount: Large (67-100%) Fascia Exposed: No Necrotic Quality: Eschar Fat Layer (Subcutaneous Tissue) Exposed: No Tendon Exposed:  No Muscle Exposed: No Joint Exposed: No Bone Exposed: No Treatment Notes Wound #2 (Toe Fourth) Wound Laterality: Left, Lateral Cleanser Wound Cleanser Discharge Instruction: Cleanse the wound with wound cleanser prior to applying a clean dressing using gauze sponges, not tissue or cotton balls. Peri-Wound Care Topical Primary Dressing KerraCel Ag Gelling Fiber Dressing, 2x2 in (silver alginate) Discharge Instruction: Apply silver alginate to wound bed as instructed Secondary Dressing Woven Gauze Sponges 2x2 in Discharge Instruction: Apply over primary dressing as directed. Secured With Conforming Stretch Gauze Bandage, Sterile 2x75 (in/in) Discharge Instruction: Secure with stretch gauze as directed. Compression Wrap Compression Stockings Add-Ons Electronic Signature(s) Signed: 11/22/2020 6:08:01 PM By: Deon Pilling Entered By: Deon Pilling on 11/22/2020 15:22:44 -------------------------------------------------------------------------------- Wound Assessment Details Patient Name: Date of Service: [name] [name]. 11/22/2020 2:30 PM Medical Record Number: [id number] Patient Account Number: [id number] Date of Birth/Sex: Treating RN: [birth year] (75 y.o. [name], [name] Primary Care Darleth Eustache: [name] Other Clinician: Referring Saralynn Langhorst: Treating Rahsaan Weakland/Extender: [name] in Treatment: 16 Wound Status Wound Number: 4 Primary Diabetic Wound/Ulcer of the Lower Extremity Etiology: Wound Location: Right [name] Great oe Wound Open Wounding Event: Not Known Status: Date Acquired: 10/11/2020 Comorbid Congestive Heart Failure, Coronary Artery Disease, Weeks Of Treatment: 5 History: Hypertension, Type II Diabetes, Seizure Disorder Clustered Wound: No Photos Photo Uploaded By: Mikeal Hawthorne on 11/26/2020 15:06:10 Wound Measurements Length: (cm) 0.4 Width: (cm) 0.9 Depth: (cm) 0.2 Area: (cm) 0.283 Volume: (cm) 0.057 % Reduction in  Area: 74.3% % Reduction in Volume: 48.2% Epithelialization: None Tunneling: No Undermining: No Wound Description Classification: Grade 2 Wound Margin: Flat and Intact Exudate Amount: None Present Foul Odor After Cleansing: No Slough/Fibrino Yes Wound Bed Granulation Amount: None Present (0%) Exposed Structure Necrotic Amount: Large (67-100%) Fascia Exposed: No Necrotic Quality: Eschar Fat Layer (Subcutaneous Tissue) Exposed: Yes Tendon Exposed: No Muscle Exposed: No Joint Exposed: No Bone Exposed: Yes Treatment Notes Wound #4 (Toe Great) Wound Laterality: Right Cleanser Wound Cleanser Discharge Instruction: Cleanse the wound with wound cleanser prior to applying a clean dressing using gauze sponges, not tissue or cotton balls. Peri-Wound Care Topical Primary Dressing KerraCel Ag Gelling Fiber Dressing, 2x2 in (silver alginate) Discharge Instruction: Apply silver alginate to  wound bed as instructed Secondary Dressing Woven Gauze Sponges 2x2 in Discharge Instruction: Apply over primary dressing as directed. Secured With Conforming Stretch Gauze Bandage, Sterile 2x75 (in/in) Discharge Instruction: Secure with stretch gauze as directed. Compression Wrap Compression Stockings Add-Ons Electronic Signature(s) Signed: 11/22/2020 6:08:01 PM By: Deon Pilling Entered By: Deon Pilling on 11/22/2020 15:23:02 -------------------------------------------------------------------------------- Wound Assessment Details Patient Name: Date of Service: [name] [name]. 11/22/2020 2:30 PM Medical Record Number: [id number] Patient Account Number: [id number] Date of Birth/Sex: Treating RN: [birth year] (75 y.o. [name], [name] Primary Care Vasilios Ottaway: [name] Other Clinician: Referring Kalyb Pemble: Treating Roseanna Koplin/Extender: [name] in Treatment: 16 Wound Status Wound Number: 5 Primary Diabetic Wound/Ulcer of the Lower Extremity Etiology: Wound  Location: Right [name] Fifth oe Wound Open Wounding Event: Not Known Status: Date Acquired: 10/11/2020 Comorbid Congestive Heart Failure, Coronary Artery Disease, Weeks Of Treatment: 5 History: Hypertension, Type II Diabetes, Seizure Disorder Clustered Wound: No Photos Photo Uploaded By: Mikeal Hawthorne on 11/26/2020 15:06:49 Wound Measurements Length: (cm) 1.5 Width: (cm) 1.5 Depth: (cm) 0.1 Area: (cm) 1.767 Volume: (cm) 0.177 % Reduction in Area: -349.6% % Reduction in Volume: -353.8% Epithelialization: None Tunneling: No Undermining: No Wound Description Classification: Grade 2 Wound Margin: Flat and Intact Exudate Amount: Medium Exudate Type: Serosanguineous Exudate Color: red, brown Foul Odor After Cleansing: No Slough/Fibrino No Wound Bed Granulation Amount: None Present (0%) Exposed Structure Necrotic Amount: Large (67-100%) Fascia Exposed: No Necrotic Quality: Eschar Fat Layer (Subcutaneous Tissue) Exposed: Yes Tendon Exposed: No Muscle Exposed: No Joint Exposed: No Bone Exposed: No Treatment Notes Wound #5 (Toe Fifth) Wound Laterality: Right Cleanser Wound Cleanser Discharge Instruction: Cleanse the wound with wound cleanser prior to applying a clean dressing using gauze sponges, not tissue or cotton balls. Peri-Wound Care Topical Primary Dressing KerraCel Ag Gelling Fiber Dressing, 2x2 in (silver alginate) Discharge Instruction: Apply silver alginate to wound bed as instructed Secondary Dressing Woven Gauze Sponges 2x2 in Discharge Instruction: Apply over primary dressing as directed. Secured With Conforming Stretch Gauze Bandage, Sterile 2x75 (in/in) Discharge Instruction: Secure with stretch gauze as directed. Compression Wrap Compression Stockings Add-Ons Electronic Signature(s) Signed: 11/22/2020 6:08:01 PM By: Deon Pilling Entered By: Deon Pilling on 11/22/2020  15:23:23 -------------------------------------------------------------------------------- Wound Assessment Details Patient Name: Date of Service: [name] [name]. 11/22/2020 2:30 PM Medical Record Number: [id number] Patient Account Number: [id number] Date of Birth/Sex: Treating RN: 05/21/1946 (75 y.o. [name], [name] Primary Care Cheyrl Buley: [name] Other Clinician: Referring Malek Skog: Treating Samyrah Bruster/Extender: [name] in Treatment: 16 Wound Status Wound Number: 6 Primary Diabetic Wound/Ulcer of the Lower Extremity Etiology: Wound Location: Right [name] Fourth oe Wound Open Wounding Event: Gradually Appeared Status: Date Acquired: 11/22/2020 Comorbid Congestive Heart Failure, Coronary Artery Disease, Comorbid Congestive Heart Failure, Coronary Artery Disease, Weeks Of Treatment: 0 History: Hypertension, Type II Diabetes, Seizure Disorder Clustered Wound: No Photos Photo Uploaded By: Mikeal Hawthorne on 11/26/2020 15:06:10 Wound Measurements Length: (cm) 0.4 Width: (cm) 1.5 Depth: (cm) 0.1 Area: (cm) 0.471 Volume: (cm) 0.047 % Reduction in Area: 0% % Reduction in Volume: 0% Epithelialization: Small (1-33%) Tunneling: No Undermining: No Wound Description Classification: Grade 2 Wound Margin: Distinct, outline attached Exudate Amount: Medium Exudate Type: Serosanguineous Exudate Color: red, brown Wound Bed Granulation Amount: Medium (34-66%) Exposed Structure Granulation Quality: Red Fascia Exposed: No Necrotic Amount: Medium (34-66%) Fat Layer (Subcutaneous Tissue) Exposed: Yes Necrotic Quality: Eschar Tendon Exposed: No Muscle Exposed: No Joint Exposed: No Bone Exposed: No Treatment Notes Wound #6 (Toe Fourth) Wound  Laterality: Right Cleanser Peri-Wound Care Topical Primary Dressing Secondary Dressing Secured With Compression Wrap Compression Stockings Add-Ons Electronic Signature(s) Signed: 11/22/2020 6:08:01 PM  By: Deon Pilling Entered By: Deon Pilling on 11/22/2020 15:23:45 -------------------------------------------------------------------------------- Vitals Details Patient Name: Date of Service: [name] [name]. 11/22/2020 2:30 PM Medical Record Number: [id number] Patient Account Number: [id number] Date of Birth/Sex: Treating RN: [birth year] (75 y.o. [name], [name] Primary Care Criss Pallone: [name] Other Clinician: Referring Zylon Creamer: Treating Seniyah Esker/Extender: [name] in Treatment: 16 Vital Signs Time Taken: 15:07 Temperature (F): 98.0 Height (in): 64 Pulse (bpm): 73 Weight (lbs): 150 Respiratory Rate (breaths/min): 15 Body Mass Index (BMI): 25.7 Blood Pressure (mmHg): 128/72 Reference Range: 80 - 120 mg / dl Electronic Signature(s) Signed: 11/23/2020 1:25:35 PM By: Sandre Kitty Entered By: Sandre Kitty on 11/22/2020 15:07:59

## 2020-11-27 DIAGNOSIS — I5033 Acute on chronic diastolic (congestive) heart failure: Secondary | ICD-10-CM | POA: Diagnosis not present

## 2020-11-27 DIAGNOSIS — S90411D Abrasion, right great toe, subsequent encounter: Secondary | ICD-10-CM | POA: Diagnosis not present

## 2020-11-27 DIAGNOSIS — E1122 Type 2 diabetes mellitus with diabetic chronic kidney disease: Secondary | ICD-10-CM | POA: Diagnosis not present

## 2020-11-27 DIAGNOSIS — I130 Hypertensive heart and chronic kidney disease with heart failure and stage 1 through stage 4 chronic kidney disease, or unspecified chronic kidney disease: Secondary | ICD-10-CM | POA: Diagnosis not present

## 2020-11-27 DIAGNOSIS — G20 Parkinson's disease: Secondary | ICD-10-CM | POA: Diagnosis not present

## 2020-11-27 DIAGNOSIS — L89892 Pressure ulcer of other site, stage 2: Secondary | ICD-10-CM | POA: Diagnosis not present

## 2020-11-27 DIAGNOSIS — L89323 Pressure ulcer of left buttock, stage 3: Secondary | ICD-10-CM | POA: Diagnosis not present

## 2020-11-27 DIAGNOSIS — I69354 Hemiplegia and hemiparesis following cerebral infarction affecting left non-dominant side: Secondary | ICD-10-CM | POA: Diagnosis not present

## 2020-11-27 DIAGNOSIS — N1832 Chronic kidney disease, stage 3b: Secondary | ICD-10-CM | POA: Diagnosis not present

## 2020-11-27 DIAGNOSIS — Z794 Long term (current) use of insulin: Secondary | ICD-10-CM | POA: Diagnosis not present

## 2020-11-28 DIAGNOSIS — L89892 Pressure ulcer of other site, stage 2: Secondary | ICD-10-CM | POA: Diagnosis not present

## 2020-11-28 DIAGNOSIS — Z794 Long term (current) use of insulin: Secondary | ICD-10-CM | POA: Diagnosis not present

## 2020-11-28 DIAGNOSIS — L89323 Pressure ulcer of left buttock, stage 3: Secondary | ICD-10-CM | POA: Diagnosis not present

## 2020-11-28 DIAGNOSIS — I69354 Hemiplegia and hemiparesis following cerebral infarction affecting left non-dominant side: Secondary | ICD-10-CM | POA: Diagnosis not present

## 2020-11-28 DIAGNOSIS — S90411D Abrasion, right great toe, subsequent encounter: Secondary | ICD-10-CM | POA: Diagnosis not present

## 2020-11-28 DIAGNOSIS — I130 Hypertensive heart and chronic kidney disease with heart failure and stage 1 through stage 4 chronic kidney disease, or unspecified chronic kidney disease: Secondary | ICD-10-CM | POA: Diagnosis not present

## 2020-11-28 DIAGNOSIS — G20 Parkinson's disease: Secondary | ICD-10-CM | POA: Diagnosis not present

## 2020-11-28 DIAGNOSIS — E1122 Type 2 diabetes mellitus with diabetic chronic kidney disease: Secondary | ICD-10-CM | POA: Diagnosis not present

## 2020-11-28 DIAGNOSIS — N1832 Chronic kidney disease, stage 3b: Secondary | ICD-10-CM | POA: Diagnosis not present

## 2020-11-28 DIAGNOSIS — I5033 Acute on chronic diastolic (congestive) heart failure: Secondary | ICD-10-CM | POA: Diagnosis not present

## 2020-12-02 DIAGNOSIS — L89300 Pressure ulcer of unspecified buttock, unstageable: Secondary | ICD-10-CM | POA: Diagnosis not present

## 2020-12-02 DIAGNOSIS — J9690 Respiratory failure, unspecified, unspecified whether with hypoxia or hypercapnia: Secondary | ICD-10-CM | POA: Diagnosis not present

## 2020-12-02 DIAGNOSIS — B3749 Other urogenital candidiasis: Secondary | ICD-10-CM | POA: Diagnosis not present

## 2020-12-04 DIAGNOSIS — Z794 Long term (current) use of insulin: Secondary | ICD-10-CM | POA: Diagnosis not present

## 2020-12-04 DIAGNOSIS — E1122 Type 2 diabetes mellitus with diabetic chronic kidney disease: Secondary | ICD-10-CM | POA: Diagnosis not present

## 2020-12-04 DIAGNOSIS — I69354 Hemiplegia and hemiparesis following cerebral infarction affecting left non-dominant side: Secondary | ICD-10-CM | POA: Diagnosis not present

## 2020-12-04 DIAGNOSIS — L89892 Pressure ulcer of other site, stage 2: Secondary | ICD-10-CM | POA: Diagnosis not present

## 2020-12-04 DIAGNOSIS — L89323 Pressure ulcer of left buttock, stage 3: Secondary | ICD-10-CM | POA: Diagnosis not present

## 2020-12-04 DIAGNOSIS — S90411D Abrasion, right great toe, subsequent encounter: Secondary | ICD-10-CM | POA: Diagnosis not present

## 2020-12-04 DIAGNOSIS — I5033 Acute on chronic diastolic (congestive) heart failure: Secondary | ICD-10-CM | POA: Diagnosis not present

## 2020-12-04 DIAGNOSIS — I130 Hypertensive heart and chronic kidney disease with heart failure and stage 1 through stage 4 chronic kidney disease, or unspecified chronic kidney disease: Secondary | ICD-10-CM | POA: Diagnosis not present

## 2020-12-04 DIAGNOSIS — G20 Parkinson's disease: Secondary | ICD-10-CM | POA: Diagnosis not present

## 2020-12-04 DIAGNOSIS — N1832 Chronic kidney disease, stage 3b: Secondary | ICD-10-CM | POA: Diagnosis not present

## 2020-12-05 ENCOUNTER — Other Ambulatory Visit: Payer: Self-pay

## 2020-12-05 ENCOUNTER — Other Ambulatory Visit: Payer: Medicare Other | Admitting: Nurse Practitioner

## 2020-12-05 DIAGNOSIS — R319 Hematuria, unspecified: Secondary | ICD-10-CM

## 2020-12-05 DIAGNOSIS — Z515 Encounter for palliative care: Secondary | ICD-10-CM

## 2020-12-05 NOTE — Progress Notes (Signed)
[facility] Consult Note Telephone: [phone]  Fax: [phone]  PATIENT NAME: [name] [name] [id number] [phone] (home)  DOB: 03-17-1946 MRN: [id number]  PRIMARY CARE PROVIDER:    [name], MD,  [place] [address] [phone]  REFERRING PROVIDER:   [name], MD [address] [place],  [place] [address] [phone]  RESPONSIBLE PARTY:   Extended Emergency Contact Information Primary Emergency Contact: [name] Address: [address]          [place], [place] [address] [name] of [employer] [place] Phone: [phone] Mobile Phone: [phone] Relation: Relative Secondary Emergency Contact: [employer] Mobile Phone: [phone] Relation: Daughter  I met face to face with patient and family in home.   ASSESSMENT AND RECOMMENDATIONS:   Advance Care Planning: I met face to face with patient and his ex-wife in home. Patient's goal of care is function, he desires to regain as much function as he can so he can assist in his care. Signed MOST form in home and on [facility] EMR. Details of MOST form include; CPR, Full scope of treatment, antibiotics if indicated, IV fluids if indicatedandfeeding tube for a defined trial period.Patient reiterated desire to pursue aggressive treatment if offered, he want to be resuscitated in the event of cardiac or respiratory arrest. Discussed the probability of poor outcome with resuscitation due to his comorbidities, patient verbalized awareness of his condition, he however said he want to take any chance he got as he was given a grime prognosis in the past and he pulled through it. Validation provided. It was also explained to patient that code status will be regularly reviewed to ensure that it reflects patient wishes.  Symptom Management:  Blood  in urine: symptoms resolved. Urine clear and yellow today. Patient with history of bladder cancer Continue monitoring and notify urology if worsening symptoms. Continue to encourage adequate fluid intake to enhance renal function. Patient continues on Lupron injections for his prostate cancer, PSA checked on 11/02/2020 was 77.23 down from 2.6 four months ago. Next injection 02/11/2021. Weakness: Patient was discharged from physical therapy being evaluated for a motorized wheel chair by [facility]. Patient and wife happy about patient regaining some independence with his mobility. Patient seen by vascular surgeon Dr. Gwenlyn Found, dopplers showed poor circulation to legs, affecting wound healing to his foot. He was deemed not a good candidate for invassive treament, just sportive care. No report of evidence of infection on the site. Denied uncontrolled pain on site.  Follow up Palliative Care Visit: Palliative care will continue to follow for complex decision making and symptom management. Return in about 4 weeks or prn.  Family /Caregiver/Community Supports: Patient lives at [name] his ex-wifeJacquelyn whois his main caregiver.[name] very knowledgeable about patient's condition and coordinates his care.  Cognitive / Functional decline: Patient awake, alert and coherent,able to make his own decisions.He is dependent on family for all of his ADLs,able to feed self.Family report ongoing improvement in function, now able to assist in his transfers and use slide board to self transfer from bed to wheelchair and bedside comode with minimal assist, he is continent of his bowels, has chronic foley. No report of falls.  I spent 50 minutes providing this consultation, time includes time spent with patient and family, chart review, provider coordination, and documentation. More than 50% of the time in this consultation was spent counseling and  coordinating communication.   CHIEF COMPLAINT: Blood in  urine  History obtained from review of EMR and discussion with patient and family. Records reviewed and summarized bellow.  HISTORY OF PRESENT ILLNESS:Luis T Cheshireis a 75 y.o.year old malewith multiple medical problems including Prostate cancerwith mets bones and pelvis, CHF (EF 60-65%), Parkinson disease,Type 2 diabetes, seizure disorder, leftarm paralysisfrom prior CVA.Family report patient with bloody urine a week ago, report symptoms resolved after 2 episodes, report Urology was made aware-recommended monitoring. Family also report complaints of bladder spasm that resolved on its own. Palliative Care was asked to help address advance care planning, goals of care and symptoms management.   Patient with poor renal function, recent labs reviewed 11/08/20: BUN 90, Cr 2.64, [name] 5.9, Albumin 3.9, GFR 24, Co2 23 11/17/2020: BUN 82,  Cr 2.21  CODE STATUS: Full code  PPS: 40%  HOSPICE ELIGIBILITY/DIAGNOSIS: TBD  ROS/staff/patient   EYES: denies vision changes ENMT: denies dysphagia Cardiovascular: denied chest pain, denied palpitation Pulmonary: denied cough, denied increased SOB Abdomen: endorses good appetite, denied constipation, denied incontinence of bowel GU:  Endorsed occasional bladder spasm MSK:  endorses ROM limitations, no falls reported Skin: denies rashes  Neurological: endorses weakness, denies pain, denies insomnia Psych: Endorses positive mood Heme/lymph/immuno: denies bruises, abnormal bleeding   Physical Exam: Weight: 140lbs General: frail appearing, lying in bed in NAD EYES: anicteric sclera, lids intact, no discharge  ENMT: intact hearing, oral mucous membranes moist CV:  no LE edema Pulmonary: no increased work of breathing, no cough, no audible wheezes, room air Abdomen: intake 100%,  no ascites GU: chronic foley cath, urine clear and yellow MSK: severe sarcopenia, decreased ROM in all extremities, left arm contracture and paralysis, non  ambulatory Skin: warm and dry, no rashes, dressing on ulcer on right great toes 4th toes intact Neuro: weakness Psych: non-anxious affect today, A and O x 3 Hem/lymph/immuno: no widespread bruising   PAST MEDICAL HISTORY:  Past Medical History:  Diagnosis Date  . At high risk for falls   . Bladder cancer [facility])    urologist-  dr Gaynelle Arabian  . CAD (coronary artery disease) cardiologist-  dr berry  . Cerebrovascular arteriosclerosis   . Gait disturbance, post-stroke    uses cane, rollator, and w/c long distance  . Hemiparkinsonism [facility]) neurologist-- dr Leta Baptist   right side body  . History of basal cell carcinoma excision    Sept 2016--  MOH's surgery side of nose  . History of carotid artery stenosis cardiologist-- dr berry   bilateral --  1999 s/p  bilateral ICA endarterectomy and recurrent restenosis 2012  s/p  staged bilateral stenting   per last duplex 2015  stents widely patent  . History of CVA with residual deficit neurologist-  dr Leta Baptist   3/ 1999  Right MCA  and  12/ 2004  Anterior division of  Right MCA ---  residual left spactic hemiparesis and left foot drop  . History of MI (myocardial infarction)    02/ 2004   s/p  cabg   . HOH (hard of hearing)    LEFT EAR  POST cva  . Hyperextension deformity of left knee    wears brace  . Hyperlipidemia   . Hypertension   . Left foot drop    residual from CVA  -- wears brace  . Left spastic hemiparesis ([facility])    residual CVA 1999  . Pancreatic pseudocyst   . Peripheral arterial disease ([place])   . Prostate cancer ([facility] [place])   .  S/P CABG x 4    02/ 2004  . Seizure disorder [facility]) last seizure 2011 due to confusion   neurologist-  dr Marjory Lies-- per note seizure documented 2008 breakthrough partial complex seizure (prior tonic-clonic seizure's post CVA)  . Type 2 diabetes, diet controlled (HCC)     2  . Visual neglect    LEFT EYE    SOCIAL HX:  Social History   Tobacco Use  . Smoking status: Former Smoker    Packs/day:  0.25    Years: 5.00    Pack years: 1.25    Types: Cigarettes    Quit date: 11/14/2007    Years since quitting: 13.0  . Smokeless tobacco: Never Used  Substance Use Topics  . Alcohol use: No   FAMILY HX:  Family History  Problem Relation Age of Onset  . Stroke Father   . Heart attack Father   . Stroke Mother   . Diabetes Brother   . Hyperlipidemia Brother   . Hypertension Brother   . Hyperlipidemia Sister   . Hypertension Sister   . Breast cancer Neg Hx   . Colon cancer Neg Hx   . Pancreatic cancer Neg Hx   . Prostate cancer Neg Hx     ALLERGIES:  Allergies  Allergen Reactions  . Altace [Ramipril] Cough  . Keflex [Cephalexin] Itching and Rash     PERTINENT MEDICATIONS:  Outpatient Encounter Medications as of 12/05/2020  Medication Sig  . acetaminophen (TYLENOL) 325 MG tablet Take 2 tablets (650 mg total) by mouth every 6 (six) hours as needed for mild pain (or Fever >/= 101).  Marland Kitchen amLODipine (NORVASC) 10 MG tablet TAKE ONE TABLET BY MOUTH DAILY  . carbidopa-levodopa (SINEMET IR) 25-100 MG tablet Take 2 tablets by mouth 3 (three) times daily. Change: Take Two tabs 3 x daily  . collagenase (SANTYL) ointment Apply topically 2 (two) times daily.  . dantrolene (DANTRIUM) 50 MG capsule TAKE ONE CAPSULE BY MOUTH TWICE A DAY  . dipyridamole-aspirin (AGGRENOX) 200-25 MG 12hr capsule Take 1 capsule by mouth 2 (two) times daily.  Marland Kitchen ezetimibe (ZETIA) 10 MG tablet TAKE ONE TABLET BY MOUTH DAILY  . fenofibrate (TRICOR) 48 MG tablet TAKE ONE TABLET BY MOUTH DAILY  . FOLBIC 2.5-25-2 MG TABS tablet Take 1 tablet by mouth once daily with food  . furosemide (LASIX) 20 MG tablet TAKE ONE TABLET BY MOUTH DAILY  . hydrALAZINE (APRESOLINE) 25 MG tablet Take 50 mg by mouth daily.  . insulin glargine (LANTUS) 100 UNIT/ML Solostar Pen Inject 10 Units into the skin daily.  . insulin lispro (HUMALOG) 100 UNIT/ML KiwkPen Inject 4 Units into the skin 3 (three) times daily as needed (high blood sugar >  200).   Marland Kitchen KEPPRA 500 MG tablet Take 1 tablet (500 mg total) by mouth 2 (two) times daily.  . metoprolol tartrate (LOPRESSOR) 25 MG tablet Take 1 tablet (25 mg total) by mouth daily.  . nitroGLYCERIN (NITROSTAT) 0.4 MG SL tablet Place 1 tablet (0.4 mg total) under the tongue every 5 (five) minutes as needed for chest pain.  Letta Pate DELICA LANCETS 33G MISC   . ONETOUCH VERIO test strip   . oxyCODONE-acetaminophen (PERCOCET/ROXICET) 5-325 MG tablet Take by mouth every 4 (four) hours as needed for severe pain.  Marland Kitchen RAPAFLO 8 MG CAPS capsule Take 8 mg by mouth daily with breakfast.   . rosuvastatin (CRESTOR) 20 MG tablet TAKE ONE TABLET BY MOUTH DAILY   No facility-administered encounter medications on file  as of 12/05/2020.    Thank you for the opportunity to participate in the care of Mr. Philipe Laswell. The palliative care team will continue to follow. Please call our office at [phone] if we can be of additional assistance.  Jari Favre, DNP, AGPCNP-BC

## 2020-12-06 DIAGNOSIS — S90411D Abrasion, right great toe, subsequent encounter: Secondary | ICD-10-CM | POA: Diagnosis not present

## 2020-12-06 DIAGNOSIS — E1122 Type 2 diabetes mellitus with diabetic chronic kidney disease: Secondary | ICD-10-CM | POA: Diagnosis not present

## 2020-12-06 DIAGNOSIS — G20 Parkinson's disease: Secondary | ICD-10-CM | POA: Diagnosis not present

## 2020-12-06 DIAGNOSIS — L89323 Pressure ulcer of left buttock, stage 3: Secondary | ICD-10-CM | POA: Diagnosis not present

## 2020-12-06 DIAGNOSIS — Z794 Long term (current) use of insulin: Secondary | ICD-10-CM | POA: Diagnosis not present

## 2020-12-06 DIAGNOSIS — N1832 Chronic kidney disease, stage 3b: Secondary | ICD-10-CM | POA: Diagnosis not present

## 2020-12-06 DIAGNOSIS — I69354 Hemiplegia and hemiparesis following cerebral infarction affecting left non-dominant side: Secondary | ICD-10-CM | POA: Diagnosis not present

## 2020-12-06 DIAGNOSIS — L89892 Pressure ulcer of other site, stage 2: Secondary | ICD-10-CM | POA: Diagnosis not present

## 2020-12-06 DIAGNOSIS — I130 Hypertensive heart and chronic kidney disease with heart failure and stage 1 through stage 4 chronic kidney disease, or unspecified chronic kidney disease: Secondary | ICD-10-CM | POA: Diagnosis not present

## 2020-12-06 DIAGNOSIS — I5033 Acute on chronic diastolic (congestive) heart failure: Secondary | ICD-10-CM | POA: Diagnosis not present

## 2020-12-07 DIAGNOSIS — L89153 Pressure ulcer of sacral region, stage 3: Secondary | ICD-10-CM | POA: Diagnosis not present

## 2020-12-07 DIAGNOSIS — L89150 Pressure ulcer of sacral region, unstageable: Secondary | ICD-10-CM | POA: Diagnosis not present

## 2020-12-13 DIAGNOSIS — L89892 Pressure ulcer of other site, stage 2: Secondary | ICD-10-CM | POA: Diagnosis not present

## 2020-12-13 DIAGNOSIS — I130 Hypertensive heart and chronic kidney disease with heart failure and stage 1 through stage 4 chronic kidney disease, or unspecified chronic kidney disease: Secondary | ICD-10-CM | POA: Diagnosis not present

## 2020-12-13 DIAGNOSIS — I5033 Acute on chronic diastolic (congestive) heart failure: Secondary | ICD-10-CM | POA: Diagnosis not present

## 2020-12-13 DIAGNOSIS — Z466 Encounter for fitting and adjustment of urinary device: Secondary | ICD-10-CM | POA: Diagnosis not present

## 2020-12-13 DIAGNOSIS — S90411D Abrasion, right great toe, subsequent encounter: Secondary | ICD-10-CM | POA: Diagnosis not present

## 2020-12-13 DIAGNOSIS — N1832 Chronic kidney disease, stage 3b: Secondary | ICD-10-CM | POA: Diagnosis not present

## 2020-12-13 DIAGNOSIS — Z794 Long term (current) use of insulin: Secondary | ICD-10-CM | POA: Diagnosis not present

## 2020-12-13 DIAGNOSIS — G20 Parkinson's disease: Secondary | ICD-10-CM | POA: Diagnosis not present

## 2020-12-13 DIAGNOSIS — E1122 Type 2 diabetes mellitus with diabetic chronic kidney disease: Secondary | ICD-10-CM | POA: Diagnosis not present

## 2020-12-13 DIAGNOSIS — I69354 Hemiplegia and hemiparesis following cerebral infarction affecting left non-dominant side: Secondary | ICD-10-CM | POA: Diagnosis not present

## 2020-12-17 ENCOUNTER — Other Ambulatory Visit: Payer: Self-pay

## 2020-12-17 MED ORDER — EZETIMIBE 10 MG PO TABS: 10.0000 mg | ORAL_TABLET | Freq: Every day | ORAL | 3 refills | Status: DC

## 2020-12-18 DIAGNOSIS — Z466 Encounter for fitting and adjustment of urinary device: Secondary | ICD-10-CM | POA: Diagnosis not present

## 2020-12-18 DIAGNOSIS — G20 Parkinson's disease: Secondary | ICD-10-CM | POA: Diagnosis not present

## 2020-12-18 DIAGNOSIS — I69354 Hemiplegia and hemiparesis following cerebral infarction affecting left non-dominant side: Secondary | ICD-10-CM | POA: Diagnosis not present

## 2020-12-18 DIAGNOSIS — I130 Hypertensive heart and chronic kidney disease with heart failure and stage 1 through stage 4 chronic kidney disease, or unspecified chronic kidney disease: Secondary | ICD-10-CM | POA: Diagnosis not present

## 2020-12-18 DIAGNOSIS — L89892 Pressure ulcer of other site, stage 2: Secondary | ICD-10-CM | POA: Diagnosis not present

## 2020-12-18 DIAGNOSIS — E1122 Type 2 diabetes mellitus with diabetic chronic kidney disease: Secondary | ICD-10-CM | POA: Diagnosis not present

## 2020-12-18 DIAGNOSIS — S90411D Abrasion, right great toe, subsequent encounter: Secondary | ICD-10-CM | POA: Diagnosis not present

## 2020-12-18 DIAGNOSIS — Z794 Long term (current) use of insulin: Secondary | ICD-10-CM | POA: Diagnosis not present

## 2020-12-18 DIAGNOSIS — N1832 Chronic kidney disease, stage 3b: Secondary | ICD-10-CM | POA: Diagnosis not present

## 2020-12-18 DIAGNOSIS — I5033 Acute on chronic diastolic (congestive) heart failure: Secondary | ICD-10-CM | POA: Diagnosis not present

## 2020-12-19 ENCOUNTER — Other Ambulatory Visit: Payer: Self-pay | Admitting: Physical Medicine & Rehabilitation

## 2020-12-19 DIAGNOSIS — R339 Retention of urine, unspecified: Secondary | ICD-10-CM | POA: Diagnosis not present

## 2020-12-20 ENCOUNTER — Other Ambulatory Visit: Payer: Self-pay

## 2020-12-20 ENCOUNTER — Encounter (HOSPITAL_BASED_OUTPATIENT_CLINIC_OR_DEPARTMENT_OTHER): Payer: Medicare Other | Attending: Internal Medicine | Admitting: Physician Assistant

## 2020-12-20 DIAGNOSIS — X58XXXD Exposure to other specified factors, subsequent encounter: Secondary | ICD-10-CM | POA: Diagnosis not present

## 2020-12-20 DIAGNOSIS — E1151 Type 2 diabetes mellitus with diabetic peripheral angiopathy without gangrene: Secondary | ICD-10-CM | POA: Diagnosis not present

## 2020-12-20 DIAGNOSIS — C7951 Secondary malignant neoplasm of bone: Secondary | ICD-10-CM | POA: Insufficient documentation

## 2020-12-20 DIAGNOSIS — C61 Malignant neoplasm of prostate: Secondary | ICD-10-CM | POA: Insufficient documentation

## 2020-12-20 DIAGNOSIS — L97514 Non-pressure chronic ulcer of other part of right foot with necrosis of bone: Secondary | ICD-10-CM | POA: Diagnosis not present

## 2020-12-20 DIAGNOSIS — I69954 Hemiplegia and hemiparesis following unspecified cerebrovascular disease affecting left non-dominant side: Secondary | ICD-10-CM | POA: Insufficient documentation

## 2020-12-20 DIAGNOSIS — L97528 Non-pressure chronic ulcer of other part of left foot with other specified severity: Secondary | ICD-10-CM | POA: Insufficient documentation

## 2020-12-20 DIAGNOSIS — L0231 Cutaneous abscess of buttock: Secondary | ICD-10-CM | POA: Diagnosis not present

## 2020-12-20 DIAGNOSIS — L97512 Non-pressure chronic ulcer of other part of right foot with fat layer exposed: Secondary | ICD-10-CM | POA: Diagnosis not present

## 2020-12-20 DIAGNOSIS — S31829D Unspecified open wound of left buttock, subsequent encounter: Secondary | ICD-10-CM | POA: Insufficient documentation

## 2020-12-20 NOTE — Progress Notes (Addendum)
[name], DOBRATZ [name] ([id number]) . Visit Report for 12/20/2020 Chief Complaint Document Details Patient Name: Date of Service: [name], [name] [facility] [name]. 12/20/2020 2:30 PM Medical Record Number: [id number] Patient Account Number: [id number] Date of Birth/Sex: Treating RN: 1946-10-08 (75 y.o. [name] Primary Care Provider: [name] Other Clinician: Referring Provider: Treating Provider/Extender: Annamary Carolin in Treatment: 20 Information Obtained from: Patient Chief Complaint 07/27/2020; patient is here for review of an area on the left buttock medially in close proximity to the gluteal cleft Electronic Signature(s) Signed: 12/20/2020 2:42:10 PM By: Worthy Keeler PA-C Entered By: Worthy Keeler on 12/20/2020 14:42:10 -------------------------------------------------------------------------------- HPI Details Patient Name: Date of Service: [name] HN [name]. 12/20/2020 2:30 PM Medical Record Number: [id number] Patient Account Number: [id number] Date of Birth/Sex: Treating RN: [birth year] (75 y.o. [name] Primary Care Provider: [name] Other Clinician: Referring Provider: Treating Provider/Extender: Annamary Carolin in Treatment: 20 History of Present Illness HPI Description: ADMISSION 07/27/2020 This is a 75 year old man who is accompanied by his wife (ex). Nevertheless she is his active caregiver. His most complicated features are remote CVA that left him with a left hemiparesis. He also has widely metastatic prostate cancer to bone which I think is contributed to increasing frailty this year and he is no longer ambulatory. He required hospitalization from 9/7 through 9/14 with respiratory failure seizures. During this time it was noted that he had an open area in the left buttock in close proximity to the gluteal cleft. His wife tells me that he has a history of perirectal abscesses. And she describes this area as  starting as a painful swelling in late August certainly sounds like an abscess. When he is in the hospital he required bedside IandD's but did not go to the OR. Has CT scan of the area showed soft tissue thickening in the perianal ulcer. At that point it was concerning for a fistula connection however a discrete abscess was not seen. Notable that he was hypoalbuminemic in the hospital but actually came out better with an albumin of 3.1 on 9/14 his wife says he is eating well. They are currently treating this with Santyl ointment and a backing wet-to-dry dressing. By description of his wife this is done quite nicely and there is certainly less debris over the wound surface. They already have a hospital bed with a level 3 pressure relief surface. The patient lies on his back when he is in bed especially at night although his wife is counseled him not to do this. They are starting sliding board transfers into his wheelchair I think with physical therapy. He has a Foley catheter in place Past medical history includes remote CVA, metastatic prostate cancer to large areas of his thoracic and lumbar spine and pelvis., Bilateral carotid artery stenosis, Parkinson's disease, type 2 diabetes, diastolic heart failure, CHF, hypertension and a recently discovered possible pancreatic mass in the head of the pancreas 08/16/2020 patient I admitted to the clinic 2-1/2 weeks ago. He had an abscess site on his left buttock that required an IandD while he was in the hospital in September. He has been using Santyl backing wet-to-dry. He is cared for her aerobically at home by his wife 2 concerning areas on his feet. A stage I pressure injury on the right heel this does not have an open wound. He also has on the lateral aspect of the left fourth toe a dry circular eschared wound. 12/2; monthly follow-up. Is  a very disabled man who had an abscess in his left buttock and required an IandD. We have been using Santyl to this  area I changed to silver collagen last time he is here and this actually looks quite a bit better The last time he was here his wife showed me an area on the left lateral fourth toe I think a pressure injury with his fifth toe. Been using Santyl here as well but not making much improvement As well she shows me he has an area on his right buttock which is very tiny pinpoint area but under illumination still obviously an open area. I wonder if this is more of a shear injury and transferring and a pressure injury per se 12/16; this is a very disabled man who has a left buttock area that required an IandD apparently an abscess. Since he has come here that he developed a area on the right buttock. His wife says that this is draining pus. Finally he has an area on the lateral part of the left fourth toe. We have been using silver collagen on the buttock and Iodoflex on the fourth toe 1/6; this is a disabled man we have been working on a left buttock area that was initially an IandD abscess injury. We have got this down to a small area in fact it appears to be epithelializing towards the base of the wound. When he was here last time he had a small draining area on the right buttock. I thought this might be a small cyst culture of the drainage was negative. I do not think this was an abscess at all While he has been here he has been developing areas on the toes on his left foot. He initially had one on the left fourth toe and then last time the right first toe. He is a type II diabetic. With he has a history of coronary artery disease. He complains of a lot of pain in the right foot 1/13; we have been working on areas on his left buttock and then on the right buttock. Both of these are healed today albeit the area on the left has some depth with skin is gone into a divot. Nevertheless I think this is a reasonable outcome. He arrived a week ago with ischemic looking wounds on his right fifth, right first and  his left fourth toe. He went on to have arterial studies which we received today these showed an ABI that was noncompressible on both sides however the great toe on the right had a TBI of 0.14 with a pressure of only 17. Monophasic waveforms on all the tibial vessels and including the popliteal on the left he had monophasic waveforms again noncompressible vessels and no recorded toe pressure. Clearly these are ischemic wounds. Fortunately he is not complaining of a lot of pain The patient has a complicated medical situation which includes widely metastatic prostate cancer to bone for which she is on palliative care he also has Parkinson's disease. I have a recent note from his neurologist at which time he asked whether they wish to de-escalate the Parkinson's regimen and the answer was no. The patient has a follow-up appointment with Dr. Gwenlyn Found on 1/28. I believe he is the patient's cardiologist but this appointment was made because of the vascular studies I think predominantly through Dr. Kennon Holter office. 2/10; patient went back to see Dr. Gwenlyn Found. He did not feel given his comorbidities that he was a candidate for endovascular therapy.  His wife seems to agree with that. He has ischemic wounds on his right toes particularly the right fifth toe fourth toe and first toe. He is not in a lot of pain unless these are manipulated 12/20/2020 on evaluation today patient appears to be doing well with regard to his feet all things considered. There does not appear to be any obvious signs of infection here. Most of the wounds are actually for the most part eschar and dry. With that being said the patient is not actually a candidate for revascularization according to Dr. Gwenlyn Found who he has seen previous. Nonetheless I think that for the time being Betadine or something of that sort just keep things clean and dry will be a good way to go. Electronic Signature(s) Signed: 12/20/2020 3:48:56 PM By: Worthy Keeler  PA-C Entered By: Worthy Keeler on 12/20/2020 15:48:56 -------------------------------------------------------------------------------- Physical Exam Details Patient Name: Date of Service: [name] HN [name]. 12/20/2020 2:30 PM Medical Record Number: [id number] Patient Account Number: [id number] Date of Birth/Sex: Treating RN: 01/06/1946 (75 y.o. [name] Primary Care Provider: [name] Other Clinician: Referring Provider: Treating Provider/Extender: Annamary Carolin in Treatment: 46 Constitutional Well-nourished and well-hydrated in no acute distress. Respiratory normal breathing without difficulty. Psychiatric this patient is able to make decisions and demonstrates good insight into disease process. Alert and Oriented x 3. pleasant and cooperative. Notes Patient's wounds currently are showing signs seems to be showing any signs of infection in regard to his BMS mainly eschar covered at this point. With that being said I do not see any evidence that the patient toes. I think Betadine is can be a good option here. With that being said I do believe that the area on his perineal area where the wife was concerned about the possibly of an abscess initially I thought was more just scar tissue but then as I felt in Catapres a little bit more looks like there could be an opening is attempting to occur. Nonetheless this could be a representation of an early abscess starting which again they have had a significant issue recently and she does not want that to happen again. For that reason I think that it is definitely appropriate for [place] to go ahead and treat this he takes Bactrim DS without any complications I think that is good for [place] to go ahead and do today. Electronic Signature(s) Signed: 12/20/2020 3:50:19 PM By: Worthy Keeler PA-C Entered By: Worthy Keeler on 12/20/2020  15:50:18 -------------------------------------------------------------------------------- Physician Orders Details Patient Name: Date of Service: [name] HN [name]. 12/20/2020 2:30 PM Medical Record Number: [id number] Patient Account Number: [id number] Date of Birth/Sex: Treating RN: 1946-09-18 (74 y.o. [name], [name] Primary Care Provider: [name] Other Clinician: Referring Provider: Treating Provider/Extender: Annamary Carolin in Treatment: 20 Verbal / Phone Orders: No Diagnosis Coding ICD-10 Coding Code Description L02.31 Cutaneous abscess of buttock S31.829D Unspecified open wound of left buttock, subsequent encounter L97.528 Non-pressure chronic ulcer of other part of left foot with other specified severity L97.514 Non-pressure chronic ulcer of other part of right foot with necrosis of bone E11.51 Type 2 diabetes mellitus with diabetic peripheral angiopathy without gangrene Follow-up Appointments Return appointment in 1 month. Off-Loading Low air-loss mattress (Group 2) - CONTINUE TO USE. Turn and reposition every 2 hours Other: - CONTINUE TO USE BUNNY BOOTS FOR HEEL PROTECTION. UP IN WHEELCHAIR NO MORE THAT 2 HOUR INCREMENTS. Ensure to relieve pressure off buttock closed  areas to prevent reopening. Home Health New wound care orders this week; continue Home Health for wound care. May utilize formulary equivalent dressing for wound treatment orders unless otherwise specified. - [facility] weekly. Wound Treatment Wound #2 - [name] Fourth oe Wound Laterality: Left, Lateral Cleanser: Wound Cleanser (Home Health) 1 x Per Day/30 Days Discharge Instructions: Cleanse the wound with wound cleanser prior to applying a clean dressing using gauze sponges, not tissue or cotton balls. Prim Dressing: betadine (Home Health) 1 x Per Day/30 Days ary Discharge Instructions: apply to wounds to keep areas dry. Secondary Dressing: Woven Gauze Sponges 2x2  in (Home Health) 1 x Per Day/30 Days Discharge Instructions: Apply over primary dressing as directed. Secured With: Child psychotherapist, Sterile 2x75 (in/in) (Home Health) 1 x Per Day/30 Days Discharge Instructions: Secure with stretch gauze as directed. Wound #4 - [name] Great oe Wound Laterality: Right Cleanser: Wound Cleanser (Home Health) 1 x Per Day/30 Days Discharge Instructions: Cleanse the wound with wound cleanser prior to applying a clean dressing using gauze sponges, not tissue or cotton balls. Prim Dressing: betadine (Home Health) 1 x Per Day/30 Days ary Discharge Instructions: apply to wounds to keep areas dry. Secondary Dressing: Woven Gauze Sponges 2x2 in (Home Health) 1 x Per Day/30 Days Discharge Instructions: Apply over primary dressing as directed. Secured With: Child psychotherapist, Sterile 2x75 (in/in) (Home Health) 1 x Per Day/30 Days Discharge Instructions: Secure with stretch gauze as directed. Wound #5 - [name] Fifth oe Wound Laterality: Right Cleanser: Wound Cleanser (Home Health) 1 x Per Day/30 Days Discharge Instructions: Cleanse the wound with wound cleanser prior to applying a clean dressing using gauze sponges, not tissue or cotton balls. Prim Dressing: betadine (Home Health) 1 x Per Day/30 Days ary Discharge Instructions: apply to wounds to keep areas dry. Secondary Dressing: Woven Gauze Sponges 2x2 in (Home Health) 1 x Per Day/30 Days Discharge Instructions: Apply over primary dressing as directed. Secured With: Child psychotherapist, Sterile 2x75 (in/in) (Home Health) 1 x Per Day/30 Days Discharge Instructions: Secure with stretch gauze as directed. Wound #6 - [name] Fourth oe Wound Laterality: Right Cleanser: Wound Cleanser (Home Health) 1 x Per Day/30 Days Discharge Instructions: Cleanse the wound with wound cleanser prior to applying a clean dressing using gauze sponges, not tissue or cotton balls. Prim Dressing: betadine (Home  Health) 1 x Per Day/30 Days ary Discharge Instructions: apply to wounds to keep areas dry. Secondary Dressing: Woven Gauze Sponges 2x2 in (Home Health) 1 x Per Day/30 Days Discharge Instructions: Apply over primary dressing as directed. Secured With: Child psychotherapist, Sterile 2x75 (in/in) (Home Health) 1 x Per Day/30 Days Discharge Instructions: Secure with stretch gauze as directed. Patient Medications llergies: Altace, cephalexin A Notifications Medication Indication Start End 12/20/2020 Bactrim DS DOSE 1 - oral 800 mg-160 mg tablet - 1 tablet oral taken 2 times per day for 10 days Electronic Signature(s) Signed: 12/20/2020 3:51:53 PM By: Worthy Keeler PA-C Entered By: Worthy Keeler on 12/20/2020 15:51:52 -------------------------------------------------------------------------------- Problem List Details Patient Name: Date of Service: [name] HN [name]. 12/20/2020 2:30 PM Medical Record Number: [id number] Patient Account Number: [id number] Date of Birth/Sex: Treating RN: 1946-05-14 (75 y.o. 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 bone 10/18/2020 No Yes E11.51 Type 2 diabetes mellitus with diabetic peripheral angiopathy without gangrene 10/18/2020 No Yes Inactive Problems ICD-10 Code Description Active Date Inactive Date L89.611 Pressure ulcer of right heel, stage 1 08/16/2020 08/16/2020 L89.312 Pressure ulcer of right  buttock, stage 2 09/13/2020 09/13/2020 Resolved Problems Electronic Signature(s) Signed: 12/20/2020 2:41:53 PM By: Worthy Keeler PA-C Entered By: Worthy Keeler on 12/20/2020 14:41:53 -------------------------------------------------------------------------------- Progress Note Details Patient Name: Date of Service: [name] HN [name]. 12/20/2020 2:30 PM Medical Record Number: [id number] Patient Account Number: [id number] Date of Birth/Sex: Treating RN: April 21, 1946 (75 y.o. [name] Primary Care Provider: [name] Other Clinician: Referring Provider: Treating Provider/Extender: Annamary Carolin in Treatment: 20 Subjective Chief Complaint Information obtained from Patient 07/27/2020; patient is here for review of an area on the left buttock medially in close proximity to the gluteal cleft History of Present Illness (HPI) ADMISSION 07/27/2020 This is a 75 year old man who is accompanied by his wife (ex). Nevertheless she is his active caregiver. His most complicated features are remote CVA that left him with a left hemiparesis. He also has widely metastatic prostate cancer to bone which I think is contributed to increasing frailty this year and he is no longer ambulatory. He required hospitalization from 9/7 through 9/14 with respiratory failure seizures. During this time it was noted that he had an open area in the left buttock in close proximity to the gluteal cleft. His wife tells me that he has a history of perirectal abscesses. And she describes this area as starting as a painful swelling in late August certainly sounds like an abscess. When he is in the hospital he required bedside IandD's but did not go to the OR. Has CT scan of the area showed soft tissue thickening in the perianal ulcer. At that point it was concerning for a fistula connection however a discrete abscess was not seen. Notable that he was hypoalbuminemic in the hospital but actually  came out better with an albumin of 3.1 on 9/14 his wife says he is eating well. They are currently treating this with Santyl ointment and a backing wet-to-dry dressing. By description of his wife this is done quite nicely and there is certainly less debris over the wound surface. They already have a hospital bed with a level 3 pressure relief surface. The patient lies on his back when he is in bed especially at night although his wife is counseled him not to do this. They are starting sliding board transfers into his wheelchair I think with physical therapy. He has a Foley catheter in place Past medical history includes remote CVA, metastatic prostate cancer to large areas of his thoracic and lumbar spine and pelvis., Bilateral carotid artery stenosis, Parkinson's disease, type 2 diabetes, diastolic heart failure, CHF, hypertension and a recently discovered possible pancreatic mass in the head of the pancreas 08/16/2020 patient I admitted to the clinic 2-1/2 weeks ago. He had an abscess site on his left buttock that required an IandD while he was in the hospital in September. He has been using Santyl backing wet-to-dry. He is cared for her aerobically at home by his wife 2 concerning areas on his feet. A stage I pressure injury on the right heel this does not have an  open wound. He also has on the lateral aspect of the left fourth toe a dry circular eschared wound. 12/2; monthly follow-up. Is a very disabled man who had an abscess in his left buttock and required an IandD. We have been using Santyl to this area I changed to silver collagen last time he is here and this actually looks quite a bit better ooThe last time he was here his wife showed me an area on the left lateral fourth toe I think a pressure injury with his fifth toe. Been using Santyl here as well but not making much improvement ooAs well she shows me he has an area on his right buttock which is very tiny pinpoint area but under  illumination still obviously an open area. I wonder if this is more of a shear injury and transferring and a pressure injury per se 12/16; this is a very disabled man who has a left buttock area that required an IandD apparently an abscess. Since he has come here that he developed a area on the right buttock. His wife says that this is draining pus. Finally he has an area on the lateral part of the left fourth toe. We have been using silver collagen on the buttock and Iodoflex on the fourth toe 1/6; this is a disabled man we have been working on a left buttock area that was initially an IandD abscess injury. We have got this down to a small area in fact it appears to be epithelializing towards the base of the wound. When he was here last time he had a small draining area on the right buttock. I thought this might be a small cyst culture of the drainage was negative. I do not think this was an abscess at all While he has been here he has been developing areas on the toes on his left foot. He initially had one on the left fourth toe and then last time the right first toe. He is a type II diabetic. With he has a history of coronary artery disease. He complains of a lot of pain in the right foot 1/13; we have been working on areas on his left buttock and then on the right buttock. Both of these are healed today albeit the area on the left has some depth with skin is gone into a divot. Nevertheless I think this is a reasonable outcome. He arrived a week ago with ischemic looking wounds on his right fifth, right first and his left fourth toe. He went on to have arterial studies which we received today these showed an ABI that was noncompressible on both sides however the great toe on the right had a TBI of 0.14 with a pressure of only 17. Monophasic waveforms on all the tibial vessels and including the popliteal on the left he had monophasic waveforms again noncompressible vessels and no recorded toe  pressure. Clearly these are ischemic wounds. Fortunately he is not complaining of a lot of pain The patient has a complicated medical situation which includes widely metastatic prostate cancer to bone for which she is on palliative care he also has Parkinson's disease. I have a recent note from his neurologist at which time he asked whether they wish to de-escalate the Parkinson's regimen and the answer was no. The patient has a follow-up appointment with Dr. Gwenlyn Found on 1/28. I believe he is the patient's cardiologist but this appointment was made because of the vascular studies I think predominantly through Dr. Kennon Holter office.  2/10; patient went back to see Dr. Gwenlyn Found. He did not feel given his comorbidities that he was a candidate for endovascular therapy. His wife seems to agree with that. He has ischemic wounds on his right toes particularly the right fifth toe fourth toe and first toe. He is not in a lot of pain unless these are manipulated 12/20/2020 on evaluation today patient appears to be doing well with regard to his feet all things considered. There does not appear to be any obvious signs of infection here. Most of the wounds are actually for the most part eschar and dry. With that being said the patient is not actually a candidate for revascularization according to Dr. Gwenlyn Found who he has seen previous. Nonetheless I think that for the time being Betadine or something of that sort just keep things clean and dry will be a good way to go. Objective Constitutional Well-nourished and well-hydrated in no acute distress. Vitals Time Taken: 3:06 PM, Height: 64 in, Weight: 150 lbs, BMI: 25.7, Temperature: 97.7 F, Pulse: 69 bpm, Respiratory Rate: 15 breaths/min, Blood Pressure: 125/67 mmHg. Respiratory normal breathing without difficulty. Psychiatric this patient is able to make decisions and demonstrates good insight into disease process. Alert and Oriented x 3. pleasant and cooperative. General  Notes: Patient's wounds currently are showing signs seems to be showing any signs of infection in regard to his BMS mainly eschar covered at this point. With that being said I do not see any evidence that the patient toes. I think Betadine is can be a good option here. With that being said I do believe that the area on his perineal area where the wife was concerned about the possibly of an abscess initially I thought was more just scar tissue but then as I felt in Catapres a little bit more looks like there could be an opening is attempting to occur. Nonetheless this could be a representation of an early abscess starting which again they have had a significant issue recently and she does not want that to happen again. For that reason I think that it is definitely appropriate for [place] to go ahead and treat this he takes Bactrim DS without any complications I think that is good for [place] to go ahead and do today. Integumentary (Hair, Skin) Wound #2 status is Open. Original cause of wound was Gradually Appeared. The date acquired was: 08/16/2020. The wound has been in treatment 18 weeks. The wound is located on the Left,Lateral [name] Fourth. The wound measures 0.1cm length x 0.1cm width x 0.1cm depth; 0.008cm^2 area and 0.001cm^3 volume. oe There is no tunneling or undermining noted. There is a none present amount of drainage noted. The wound margin is flat and intact. There is no granulation within the wound bed. There is no necrotic tissue within the wound bed. Wound #4 status is Open. Original cause of wound was Not Known. The date acquired was: 10/11/2020. The wound has been in treatment 9 weeks. The wound is located on the Right [name] Great. The wound measures 0.7cm length x 0.6cm width x 0.1cm depth; 0.33cm^2 area and 0.033cm^3 volume. There is bone and oe Fat Layer (Subcutaneous Tissue) exposed. There is no tunneling or undermining noted. There is a none present amount of drainage noted. The wound margin  is flat and intact. There is no granulation within the wound bed. There is a large (67-100%) amount of necrotic tissue within the wound bed including Eschar. Wound #5 status is Open. Original cause of wound  was Not Known. The date acquired was: 10/11/2020. The wound has been in treatment 9 weeks. The wound is located on the Right [name]. The wound measures 1cm length x 0.5cm width x 0.1cm depth; 0.393cm^2 area and 0.039cm^3 volume. There is Fat Layer oe (Subcutaneous Tissue) exposed. There is no tunneling or undermining noted. There is a medium amount of serosanguineous drainage noted. The wound margin is distinct with the outline attached to the wound base. There is no granulation within the wound bed. There is a large (67-100%) amount of necrotic tissue within the wound bed including Eschar. Wound #6 status is Open. Original cause of wound was Gradually Appeared. The date acquired was: 11/22/2020. The wound has been in treatment 4 weeks. The wound is located on the Right [name]. The wound measures 0.5cm length x 1.3cm width x 0.1cm depth; 0.511cm^2 area and 0.051cm^3 volume. There is oe Fat Layer (Subcutaneous Tissue) exposed. There is no tunneling or undermining noted. There is a medium amount of serosanguineous drainage noted. The wound margin is distinct with the outline attached to the wound base. There is small (1-33%) red granulation within the wound bed. There is a large (67-100%) amount of necrotic tissue within the wound bed including Eschar. 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 Off-Loading: Low  air-loss mattress (Group 2) - CONTINUE TO USE. Turn and reposition every 2 hours Other: - CONTINUE TO USE BUNNY BOOTS FOR HEEL PROTECTION. UP IN WHEELCHAIR NO MORE THAT 2 HOUR INCREMENTS. Ensure to relieve pressure off buttock closed areas to prevent reopening. Home Health: New wound care orders this week; continue Home Health for wound care. May utilize formulary equivalent dressing for wound treatment orders unless otherwise specified. - [facility] weekly. The following medication(s) was prescribed: Bactrim DS oral 800 mg-160 mg tablet 1 1 tablet oral taken 2 times per day for 10 days starting 12/20/2020 WOUND #2: - [name] Fourth Wound Laterality: Left, Lateral oe Cleanser: Wound Cleanser (Home Health) 1 x Per Day/30 Days Discharge Instructions: Cleanse the wound with wound cleanser prior to applying a clean dressing using gauze sponges, not tissue or cotton balls. Prim Dressing: betadine (Home Health) 1 x Per Day/30 Days ary Discharge Instructions: apply to wounds to keep areas dry. Secondary Dressing: Woven Gauze Sponges 2x2 in (Home Health) 1 x Per Day/30 Days Discharge Instructions: Apply over primary dressing as directed. Secured With: Child psychotherapist, Sterile 2x75 (in/in) (Home Health) 1 x Per Day/30 Days Discharge Instructions: Secure with stretch gauze as directed. WOUND #4: - [name] Great Wound Laterality: Right oe Cleanser: Wound Cleanser (Home Health) 1 x Per Day/30 Days Discharge Instructions: Cleanse the wound with wound cleanser prior to applying a clean dressing using gauze sponges, not tissue or cotton balls. Prim Dressing: betadine (Home Health) 1 x Per Day/30 Days ary Discharge Instructions: apply to wounds to keep areas dry. Secondary Dressing: Woven Gauze Sponges 2x2 in (Home Health) 1 x Per Day/30 Days Discharge Instructions: Apply over primary dressing as directed. Secured With: Child psychotherapist, Sterile 2x75 (in/in) (Home Health) 1  x Per Day/30 Days Discharge Instructions: Secure with stretch gauze as directed. WOUND #5: - [name] Wound Laterality:  Right oe Cleanser: Wound Cleanser (Home Health) 1 x Per Day/30 Days Discharge Instructions: Cleanse the wound with wound cleanser prior to applying a clean dressing using gauze sponges, not tissue or cotton balls. Prim Dressing: betadine (Home Health) 1 x Per Day/30 Days ary Discharge Instructions: apply to wounds to keep areas dry. Secondary Dressing: Woven Gauze Sponges 2x2 in (Home Health) 1 x Per Day/30 Days Discharge Instructions: Apply over primary dressing as directed. Secured With: Child psychotherapist, Sterile 2x75 (in/in) (Home Health) 1 x Per Day/30 Days Discharge Instructions: Secure with stretch gauze as directed. WOUND #6: - [name] Fourth Wound Laterality: Right oe Cleanser: Wound Cleanser (Home Health) 1 x Per Day/30 Days Discharge Instructions: Cleanse the wound with wound cleanser prior to applying a clean dressing using gauze sponges, not tissue or cotton balls. Prim Dressing: betadine (Home Health) 1 x Per Day/30 Days ary Discharge Instructions: apply to wounds to keep areas dry. Secondary Dressing: Woven Gauze Sponges 2x2 in (Home Health) 1 x Per Day/30 Days Discharge Instructions: Apply over primary dressing as directed. Secured With: Child psychotherapist, Sterile 2x75 (in/in) (Home Health) 1 x Per Day/30 Days Discharge Instructions: Secure with stretch gauze as directed. 1. I would recommend currently that we actually go ahead and continue with the recommendation for a antibiotic and again I did give a prescription for Bactrim DS based on what I am seeing today. This abscess may not be anything but I do believe that it may be trying to open as well in the perineal region. For that reason I did actually suggest that we go ahead and at least attempt to prevent this from worsening. Patient's wife was in agreement with plan and  appreciative. 2. I am also can recommend that the patient continue to offload to protect his feet. 3. I am also going to suggest that we continue in general with Betadine really to all wound areas try to keep this clean and dry as much as possible. I think using gauze on top of that will be appropriate at home believe there is any need for further treatment otherwise. We will see patient back for reevaluation in 4 weeks here in the clinic. If anything worsens or changes patient will contact our office for additional recommendations. Electronic Signature(s) Signed: 12/20/2020 5:22:10 PM By: Worthy Keeler PA-C Entered By: Worthy Keeler on 12/20/2020 17:22:10 -------------------------------------------------------------------------------- SuperBill Details Patient Name: Date of Service: Alycia Patten [name]. 12/20/2020 Medical Record Number: [id number] Patient Account Number: [id number] Date of Birth/Sex: Treating RN: 1946/06/07 (75 y.o. [name], [name] Primary Care Provider: [name] Other Clinician: Referring Provider: Treating Provider/Extender: Annamary Carolin in Treatment: 20 Diagnosis Coding ICD-10 Codes Code Description L02.31 Cutaneous abscess of buttock S31.829D Unspecified open wound of left buttock, subsequent encounter L97.528 Non-pressure chronic ulcer of other part of left foot with other specified severity L97.514 Non-pressure chronic ulcer of other part of right foot with necrosis of bone E11.51 Type 2 diabetes mellitus with diabetic peripheral angiopathy without gangrene Facility Procedures CPT4 Code: [id number] Description: [phone] - WOUND CARE VISIT-LEV 5 EST PT Modifier: Quantity: 1 Physician Procedures : CPT4 Code Description Modifier [id number] - WC PHYS LEVEL 4 - EST PT ICD-10 Diagnosis Description L02.31 Cutaneous abscess of buttock S31.829D Unspecified open wound of left buttock, subsequent encounter L97.528 Non-pressure chronic  ulcer of other  part of left foot with other specified severity L97.514 Non-pressure chronic ulcer of other part of right foot with necrosis of  bone Quantity: 1 Electronic Signature(s) Signed: 12/20/2020 5:22:24 PM By: Worthy Keeler PA-C Entered By: Worthy Keeler on 12/20/2020 17:22:23

## 2020-12-21 NOTE — Progress Notes (Addendum)
[name] ([id number]) . Visit Report for 12/20/2020 Arrival Information Details Patient Name: Date of Service: [name] [facility] [name]. 12/20/2020 2:30 PM Medical Record Number: [id number] Patient Account Number: [id number] Date of Birth/Sex: Treating RN: [birth year] (75 y.o. [name], [name] Primary Care Julaine Zimny: [name] Other Clinician: Referring [name]: Treating [name]/Extender: Annamary Carolin in Treatment: 20 Visit Information History Since Last Visit Added or deleted any medications: No Patient Arrived: Wheel Chair Any new allergies or adverse reactions: No Arrival Time: 15:06 Had a fall or experienced change in No Accompanied By: wife activities of daily living that may affect Transfer Assistance: Transfer Board risk of falls: Patient Identification Verified: Yes Signs or symptoms of abuse/neglect since last visito No Secondary Verification Process Completed: Yes Hospitalized since last visit: No Patient Requires Transmission-Based Precautions: No Implantable device outside of the clinic excluding No Patient Has Alerts: Yes cellular tissue based products placed in the center Patient Alerts: Patient on Blood Thinner since last visit: Has Dressing in Place as Prescribed: Yes Pain Present Now: No Electronic Signature(s) Signed: 12/21/2020 10:32:25 AM By: Sandre Kitty Entered By: Sandre Kitty on 12/20/2020 15:06:29 -------------------------------------------------------------------------------- Clinic Level of Care Assessment Details Patient Name: Date of Service: [name] [facility] [name]. 12/20/2020 2:30 PM Medical Record Number: [id number] Patient Account Number: [id number] Date of Birth/Sex: Treating RN: 11-09-1945 (75 y.o. [name], [name] Primary Care Malcom Selmer: [name] Other Clinician: Referring Dunbar Buras: Treating Keiyana Stehr/Extender: Annamary Carolin in Treatment: 20 Clinic Level of Care Assessment  Items TOOL 4 Quantity Score X- 1 0 Use when only an EandM is performed on FOLLOW-UP visit ASSESSMENTS - Nursing Assessment / Reassessment X- 1 10 Reassessment of Co-morbidities (includes updates in patient status) X- 1 5 Reassessment of Adherence to Treatment Plan ASSESSMENTS - Wound and Skin A ssessment / Reassessment [] - 0 Simple Wound Assessment / Reassessment - one wound X- 4 5 Complex Wound Assessment / Reassessment - multiple wounds X- 1 10 Dermatologic / Skin Assessment (not related to wound area) ASSESSMENTS - Focused Assessment [] - 0 Circumferential Edema Measurements - multi extremities X- 1 10 Nutritional Assessment / Counseling / Intervention [] - 0 Lower Extremity Assessment (monofilament, tuning fork, pulses) [] - 0 Peripheral Arterial Disease Assessment (using hand held doppler) ASSESSMENTS - Ostomy and/or Continence Assessment and Care [] - 0 Incontinence Assessment and Management [] - 0 Ostomy Care Assessment and Management (repouching, etc.) PROCESS - Coordination of Care [] - 0 Simple Patient / Family Education for ongoing care X- 1 20 Complex (extensive) Patient / Family Education for ongoing care X- 1 10 Staff obtains Programmer, systems, Records, [name] Results / Process Orders est X- 1 10 Staff telephones HHA, Nursing Homes / Clarify orders / etc [] - 0 Routine Transfer to another Facility (non-emergent condition) [] - 0 Routine Hospital Admission (non-emergent condition) [] - 0 New Admissions / Biomedical engineer / Ordering NPWT Apligraf, etc. , [] - 0 Emergency Hospital Admission (emergent condition) [] - 0 Simple Discharge Coordination X- 1 15 Complex (extensive) Discharge Coordination PROCESS - Special Needs [] - 0 Pediatric / Minor Patient Management [] - 0 Isolation Patient Management [] - 0 Hearing / Language / Visual special needs [] - 0 Assessment of Community assistance (transportation, D/C planning, etc.) [] - 0 Additional  assistance / Altered mentation [] - 0 Support Surface(s) Assessment (bed, cushion, seat, etc.) INTERVENTIONS - Wound Cleansing / Measurement [] - 0 Simple Wound Cleansing - one wound X- 4 5  Complex Wound Cleansing - multiple wounds X- 1 5 Wound Imaging (photographs - any number of wounds) [] - 0 Wound Tracing (instead of photographs) [] - 0 Simple Wound Measurement - one wound X- 4 5 Complex Wound Measurement - multiple wounds INTERVENTIONS - Wound Dressings X - Small Wound Dressing one or multiple wounds 4 10 [] - 0 Medium Wound Dressing one or multiple wounds [] - 0 Large Wound Dressing one or multiple wounds [] - 0 Application of Medications - topical [] - 0 Application of Medications - injection INTERVENTIONS - Miscellaneous [] - 0 External ear exam [] - 0 Specimen Collection (cultures, biopsies, blood, body fluids, etc.) [] - 0 Specimen(s) / Culture(s) sent or taken to Lab for analysis [] - 0 Patient Transfer (multiple staff / Civil Service fast streamer / Similar devices) [] - 0 Simple Staple / Suture removal (25 or less) [] - 0 Complex Staple / Suture removal (26 or more) [] - 0 Hypo / Hyperglycemic Management (close monitor of Blood Glucose) [] - 0 Ankle / Brachial Index (ABI) - do not check if billed separately X- 1 5 Vital Signs Has the patient been seen at the hospital within the last three years: Yes Total Score: 200 Level Of Care: New/Established - Level 5 Electronic Signature(s) Signed: 12/20/2020 5:47:26 PM By: Deon Pilling Entered By: Deon Pilling on 12/20/2020 15:38:49 -------------------------------------------------------------------------------- Encounter Discharge Information Details Patient Name: Date of Service: [name] [facility] [name]. 12/20/2020 2:30 PM Medical Record Number: [id number] Patient Account Number: [id number] Date of Birth/Sex: Treating RN: 07-20-1946 (75 y.o. [name] Primary Care Provider: [name] Other Clinician: Referring  Provider: Treating Provider/Extender: Annamary Carolin in Treatment: 20 Encounter Discharge Information Items Discharge Condition: Stable Ambulatory Status: Wheelchair Discharge Destination: Home Transportation: Private Auto Accompanied By: wife Schedule Follow-up Appointment: Yes Clinical Summary of Care: Provided on 12/20/2020 Form Type Recipient Paper Patient Patient Electronic Signature(s) Signed: 12/20/2020 3:40:57 PM By: Lorrin Jackson Entered By: Lorrin Jackson on 12/20/2020 15:40:57 -------------------------------------------------------------------------------- Lower Extremity Assessment Details Patient Name: Date of Service: [name] [facility] [name]. 12/20/2020 2:30 PM Medical Record Number: [id number] Patient Account Number: [id number] Date of Birth/Sex: Treating RN: 04/06/1946 (75 y.o. [name] Primary Care Provider: [name] Other Clinician: Referring Provider: Treating Provider/Extender: Jeannie Fend Weeks in Treatment: 20 Edema Assessment Assessed: [Left: No] Patrice Paradise: Yes] Edema: [Left: N] [Right: o] Calf Left: Right: Point of Measurement: 27 cm From Medial Instep 26 cm Ankle Left: Right: Point of Measurement: 9 cm From Medial Instep 16 cm Vascular Assessment Pulses: Dorsalis Pedis Palpable: [Right:Yes] Electronic Signature(s) Signed: 12/20/2020 5:24:46 PM By: Lorrin Jackson Signed: 12/20/2020 5:47:26 PM By: Deon Pilling Entered By: Lorrin Jackson on 12/20/2020 15:18:49 -------------------------------------------------------------------------------- Multi-Disciplinary Care Plan Details Patient Name: Date of Service: [name] [facility] [name]. 12/20/2020 2:30 PM Medical Record Number: [id number] Patient Account Number: [id number] Date of Birth/Sex: Treating RN: 1945-12-14 (75 y.o. [name] Primary Care Provider: [name] Other Clinician: Referring Provider: Treating Provider/Extender: Annamary Carolin in Treatment: Turner reviewed with physician Active Inactive Wound/Skin Impairment Nursing Diagnoses: Impaired tissue integrity Knowledge deficit related to ulceration/compromised skin integrity Goals: Patient/caregiver will verbalize understanding of skin care regimen Date Initiated: 07/27/2020 Target Resolution Date: 03/08/2021 Goal Status: Active Ulcer/skin breakdown will have a volume reduction of 30% by week 4 Date Initiated: 07/27/2020 Date Inactivated: 09/13/2020 Target Resolution Date: 08/24/2020 Goal Status: Met Interventions: Assess patient/caregiver ability to obtain necessary supplies Assess patient/caregiver  ability to perform ulcer/skin care regimen upon admission and as needed Assess ulceration(s) every visit Provide education on ulcer and skin care Treatment Activities: Skin care regimen initiated : 07/27/2020 Topical wound management initiated : 07/27/2020 Notes: Electronic Signature(s) Signed: 12/20/2020 5:47:26 PM By: Deon Pilling Entered By: Deon Pilling on 12/20/2020 15:35:33 -------------------------------------------------------------------------------- Pain Assessment Details Patient Name: Date of Service: Luis Patten [name]. 12/20/2020 2:30 PM Medical Record Number: [id number] Patient Account Number: [id number] Date of Birth/Sex: Treating RN: [birth year] (74 y.o. [name] Primary Care Monta Police: [name] Other Clinician: Referring Bergen Magner: Treating Jayanth Szczesniak/Extender: Annamary Carolin in Treatment: 20 Active Problems Location of Pain Severity and Description of Pain Patient Has Paino No Site Locations Pain Management and Medication Current Pain Management: Electronic Signature(s) Signed: 12/20/2020 5:47:26 PM By: Deon Pilling Signed: 12/21/2020 10:32:25 AM By: Sandre Kitty Entered By: Sandre Kitty on 12/20/2020  15:06:55 -------------------------------------------------------------------------------- Patient/Caregiver Education Details Patient Name: Date of Service: Julieta Gutting 3/10/2022andnbsp2:30 PM Medical Record Number: [id number] Patient Account Number: [id number] Date of Birth/Gender: Treating RN: Mar 03, 1946 (75 y.o. [name] Primary Care Physician: [name] Other Clinician: Referring Physician: Treating Physician/Extender: Annamary Carolin in Treatment: 20 Education Assessment Education Provided To: Patient Education Topics Provided Wound/Skin Impairment: Handouts: Skin Care Do's and Dont's Methods: Explain/Verbal Responses: Reinforcements needed Electronic Signature(s) Signed: 12/20/2020 5:47:26 PM By: Deon Pilling Entered By: Deon Pilling on 12/20/2020 15:35:47 -------------------------------------------------------------------------------- Wound Assessment Details Patient Name: Date of Service: Luis Patten [name]. 12/20/2020 2:30 PM Medical Record Number: [id number] Patient Account Number: [id number] Date of Birth/Sex: Treating RN: April 01, 1946 (75 y.o. [name], [name] Primary Care Lequisha Cammack: [name] Other Clinician: Referring Denarius Sesler: Treating Usman Millett/Extender: Annamary Carolin in Treatment: 20 Wound Status Wound Number: 2 Primary Diabetic Wound/Ulcer of the Lower Extremity Etiology: Wound Location: Left, Lateral [name] Fourth oe Wound Open Wounding Event: Gradually Appeared Status: Date Acquired: 08/16/2020 Comorbid Congestive Heart Failure, Coronary Artery Disease, Weeks Of Treatment: 18 History: Hypertension, Type II Diabetes, Seizure Disorder Clustered Wound: No Wound Measurements Length: (cm) 0.1 Width: (cm) 0.1 Depth: (cm) 0.1 Area: (cm) 0.008 Volume: (cm) 0.001 % Reduction in Area: 93.7% % Reduction in Volume: 92.3% Epithelialization: Large (67-100%) Tunneling: No Undermining:  No Wound Description Classification: Grade 2 Wound Margin: Flat and Intact Exudate Amount: None Present Foul Odor After Cleansing: No Slough/Fibrino Yes Wound Bed Granulation Amount: None Present (0%) Exposed Structure Necrotic Amount: None Present (0%) Fascia Exposed: No Fat Layer (Subcutaneous Tissue) Exposed: No Tendon Exposed: No Muscle Exposed: No Joint Exposed: No Bone Exposed: No Treatment Notes Wound #2 (Toe Fourth) Wound Laterality: Left, Lateral Cleanser Wound Cleanser Discharge Instruction: Cleanse the wound with wound cleanser prior to applying a clean dressing using gauze sponges, not tissue or cotton balls. Peri-Wound Care Topical Primary Dressing betadine Discharge Instruction: apply to wounds to keep areas dry. Secondary Dressing Woven Gauze Sponges 2x2 in Discharge Instruction: Apply over primary dressing as directed. Secured With Conforming Stretch Gauze Bandage, Sterile 2x75 (in/in) Discharge Instruction: Secure with stretch gauze as directed. Compression Wrap Compression Stockings Add-Ons Electronic Signature(s) Signed: 12/20/2020 5:47:26 PM By: Deon Pilling Entered By: Deon Pilling on 12/20/2020 15:35:18 -------------------------------------------------------------------------------- Wound Assessment Details Patient Name: Date of Service: Luis Patten [name]. 12/20/2020 2:30 PM Medical Record Number: [id number] Patient Account Number: [id number] Date of Birth/Sex: Treating RN: 1946/05/25 (75 y.o. [name] Primary Care Taimi Towe: [name] Other Clinician: Referring Shivaan Tierno: Treating Mischele Detter/Extender: Annamary Carolin in  Treatment: 20 Wound Status Wound Number: 4 Primary Diabetic Wound/Ulcer of the Lower Extremity Etiology: Wound Location: Right [name] Great oe Wound Open Wounding Event: Not Known Status: Date Acquired: 10/11/2020 Comorbid Congestive Heart Failure, Coronary Artery Disease, Weeks Of  Treatment: 9 History: Hypertension, Type II Diabetes, Seizure Disorder Clustered Wound: No Photos Wound Measurements Length: (cm) 0.7 Width: (cm) 0.6 Depth: (cm) 0.1 Area: (cm) 0.33 Volume: (cm) 0.033 % Reduction in Area: 70% % Reduction in Volume: 70% Epithelialization: Medium (34-66%) Tunneling: No Undermining: No Wound Description Classification: Grade 2 Wound Margin: Flat and Intact Exudate Amount: None Present Foul Odor After Cleansing: No Slough/Fibrino Yes Wound Bed Granulation Amount: None Present (0%) Exposed Structure Necrotic Amount: Large (67-100%) Fascia Exposed: No Necrotic Quality: Eschar Fat Layer (Subcutaneous Tissue) Exposed: Yes Tendon Exposed: No Muscle Exposed: No Joint Exposed: No Bone Exposed: Yes Treatment Notes Wound #4 (Toe Great) Wound Laterality: Right Cleanser Wound Cleanser Discharge Instruction: Cleanse the wound with wound cleanser prior to applying a clean dressing using gauze sponges, not tissue or cotton balls. Peri-Wound Care Topical Primary Dressing betadine Discharge Instruction: apply to wounds to keep areas dry. Secondary Dressing Woven Gauze Sponges 2x2 in Discharge Instruction: Apply over primary dressing as directed. Secured With Conforming Stretch Gauze Bandage, Sterile 2x75 (in/in) Discharge Instruction: Secure with stretch gauze as directed. Compression Wrap Compression Stockings Add-Ons Electronic Signature(s) Signed: 12/21/2020 11:17:21 AM By: Sandre Kitty Signed: 12/21/2020 5:29:13 PM By: Deon Pilling Previous Signature: 12/20/2020 5:24:46 PM Version By: Lorrin Jackson Previous Signature: 12/20/2020 5:47:26 PM Version By: Deon Pilling Entered By: Sandre Kitty on 12/21/2020 10:56:36 -------------------------------------------------------------------------------- Wound Assessment Details Patient Name: Date of Service: [name] HN [name]. 12/20/2020 2:30 PM Medical Record Number: [id number] Patient  Account Number: [id number] Date of Birth/Sex: Treating RN: Aug 24, 1946 (75 y.o. [name], [name] Primary Care Provider: [name] Other Clinician: Referring Provider: Treating Provider/Extender: Annamary Carolin in Treatment: 20 Wound Status Wound Number: 5 Primary Diabetic Wound/Ulcer of the Lower Extremity Etiology: Wound Location: Right [name] Fifth oe Wound Open Wounding Event: Not Known Status: Date Acquired: 10/11/2020 Comorbid Congestive Heart Failure, Coronary Artery Disease, Weeks Of Treatment: 9 History: Hypertension, Type II Diabetes, Seizure Disorder Clustered Wound: No Photos Wound Measurements Length: (cm) 1 Width: (cm) 0.5 Depth: (cm) 0.1 Area: (cm) 0.393 Volume: (cm) 0.039 % Reduction in Area: 0% % Reduction in Volume: 0% Epithelialization: None Tunneling: No Undermining: No Wound Description Classification: Grade 2 Wound Margin: Distinct, outline attached Exudate Amount: Medium Exudate Type: Serosanguineous Exudate Color: red, brown Foul Odor After Cleansing: No Slough/Fibrino No Wound Bed Granulation Amount: None Present (0%) Exposed Structure Necrotic Amount: Large (67-100%) Fascia Exposed: No Necrotic Quality: Eschar Fat Layer (Subcutaneous Tissue) Exposed: Yes Tendon Exposed: No Muscle Exposed: No Joint Exposed: No Bone Exposed: No Treatment Notes Wound #5 (Toe Fifth) Wound Laterality: Right Cleanser Wound Cleanser Discharge Instruction: Cleanse the wound with wound cleanser prior to applying a clean dressing using gauze sponges, not tissue or cotton balls. Peri-Wound Care Topical Primary Dressing betadine Discharge Instruction: apply to wounds to keep areas dry. Secondary Dressing Woven Gauze Sponges 2x2 in Discharge Instruction: Apply over primary dressing as directed. Secured With Conforming Stretch Gauze Bandage, Sterile 2x75 (in/in) Discharge Instruction: Secure with stretch gauze as  directed. Compression Wrap Compression Stockings Add-Ons Electronic Signature(s) Signed: 12/21/2020 11:17:21 AM By: Sandre Kitty Signed: 12/21/2020 5:29:13 PM By: Deon Pilling Previous Signature: 12/20/2020 5:24:46 PM Version By: Lorrin Jackson Previous Signature: 12/20/2020 5:47:26 PM Version By: Deon Pilling Entered By:  Sandre Kitty on 12/21/2020 10:58:05 -------------------------------------------------------------------------------- Wound Assessment Details Patient Name: Date of Service: [name], Luis [facility] [name]. 12/20/2020 2:30 PM Medical Record Number: [id number] Patient Account Number: [id number] Date of Birth/Sex: Treating RN: 09/10/1946 (75 y.o. [name], [name] Primary Care Dorcas Melito: [name] Other Clinician: Referring Chizara Mena: Treating Amilah Greenspan/Extender: Annamary Carolin in Treatment: 20 Wound Status Wound Number: 6 Primary Diabetic Wound/Ulcer of the Lower Extremity Etiology: Wound Location: Right [name] Fourth oe Wound Open Wounding Event: Gradually Appeared Status: Date Acquired: 11/22/2020 Comorbid Congestive Heart Failure, Coronary Artery Disease, Weeks Of Treatment: 4 History: Hypertension, Type II Diabetes, Seizure Disorder Clustered Wound: No Photos Wound Measurements Length: (cm) 0.5 Width: (cm) 1.3 Depth: (cm) 0.1 Area: (cm) 0.511 Volume: (cm) 0.051 % Reduction in Area: -8.5% % Reduction in Volume: -8.5% Epithelialization: None Tunneling: No Undermining: No Wound Description Classification: Grade 2 Wound Margin: Distinct, outline attached Exudate Amount: Medium Exudate Type: Serosanguineous Exudate Color: red, brown Foul Odor After Cleansing: No Slough/Fibrino No Wound Bed Granulation Amount: Small (1-33%) Exposed Structure Granulation Quality: Red Fascia Exposed: No Necrotic Amount: Large (67-100%) Fat Layer (Subcutaneous Tissue) Exposed: Yes Necrotic Quality: Eschar Tendon Exposed: No Muscle Exposed:  No Joint Exposed: No Bone Exposed: No Treatment Notes Wound #6 (Toe Fourth) Wound Laterality: Right Cleanser Wound Cleanser Discharge Instruction: Cleanse the wound with wound cleanser prior to applying a clean dressing using gauze sponges, not tissue or cotton balls. Peri-Wound Care Topical Primary Dressing betadine Discharge Instruction: apply to wounds to keep areas dry. Secondary Dressing Woven Gauze Sponges 2x2 in Discharge Instruction: Apply over primary dressing as directed. Secured With Conforming Stretch Gauze Bandage, Sterile 2x75 (in/in) Discharge Instruction: Secure with stretch gauze as directed. Compression Wrap Compression Stockings Add-Ons Electronic Signature(s) Signed: 12/21/2020 11:17:21 AM By: Sandre Kitty Signed: 12/21/2020 5:29:13 PM By: Deon Pilling Previous Signature: 12/20/2020 5:24:46 PM Version By: Lorrin Jackson Previous Signature: 12/20/2020 5:47:26 PM Version By: Deon Pilling Entered By: Sandre Kitty on 12/21/2020 10:57:25 -------------------------------------------------------------------------------- Vitals Details Patient Name: Date of Service: [name] HN [name]. 12/20/2020 2:30 PM Medical Record Number: [id number] Patient Account Number: [id number] Date of Birth/Sex: Treating RN: [birth year] (75 y.o. [name] Primary Care Zyniah Ferraiolo: [name] Other Clinician: Referring Aliviyah Malanga: Treating Delorese Sellin/Extender: Annamary Carolin in Treatment: 20 Vital Signs Time Taken: 15:06 Temperature (F): 97.7 Height (in): 64 Pulse (bpm): 69 Weight (lbs): 150 Respiratory Rate (breaths/min): 15 Body Mass Index (BMI): 25.7 Blood Pressure (mmHg): 125/67 Reference Range: 80 - 120 mg / dl Electronic Signature(s) Signed: 12/21/2020 10:32:25 AM By: Sandre Kitty Entered By: Sandre Kitty on 12/20/2020 15:06:49

## 2020-12-25 DIAGNOSIS — G20 Parkinson's disease: Secondary | ICD-10-CM | POA: Diagnosis not present

## 2020-12-25 DIAGNOSIS — I69354 Hemiplegia and hemiparesis following cerebral infarction affecting left non-dominant side: Secondary | ICD-10-CM | POA: Diagnosis not present

## 2020-12-25 DIAGNOSIS — I130 Hypertensive heart and chronic kidney disease with heart failure and stage 1 through stage 4 chronic kidney disease, or unspecified chronic kidney disease: Secondary | ICD-10-CM | POA: Diagnosis not present

## 2020-12-25 DIAGNOSIS — Z794 Long term (current) use of insulin: Secondary | ICD-10-CM | POA: Diagnosis not present

## 2020-12-25 DIAGNOSIS — N1832 Chronic kidney disease, stage 3b: Secondary | ICD-10-CM | POA: Diagnosis not present

## 2020-12-25 DIAGNOSIS — L89892 Pressure ulcer of other site, stage 2: Secondary | ICD-10-CM | POA: Diagnosis not present

## 2020-12-25 DIAGNOSIS — S90411D Abrasion, right great toe, subsequent encounter: Secondary | ICD-10-CM | POA: Diagnosis not present

## 2020-12-25 DIAGNOSIS — E1122 Type 2 diabetes mellitus with diabetic chronic kidney disease: Secondary | ICD-10-CM | POA: Diagnosis not present

## 2020-12-25 DIAGNOSIS — Z466 Encounter for fitting and adjustment of urinary device: Secondary | ICD-10-CM | POA: Diagnosis not present

## 2020-12-25 DIAGNOSIS — I5033 Acute on chronic diastolic (congestive) heart failure: Secondary | ICD-10-CM | POA: Diagnosis not present

## 2020-12-26 DIAGNOSIS — I130 Hypertensive heart and chronic kidney disease with heart failure and stage 1 through stage 4 chronic kidney disease, or unspecified chronic kidney disease: Secondary | ICD-10-CM | POA: Diagnosis not present

## 2020-12-26 DIAGNOSIS — R1310 Dysphagia, unspecified: Secondary | ICD-10-CM | POA: Diagnosis not present

## 2020-12-26 DIAGNOSIS — R339 Retention of urine, unspecified: Secondary | ICD-10-CM | POA: Diagnosis not present

## 2020-12-26 DIAGNOSIS — E1122 Type 2 diabetes mellitus with diabetic chronic kidney disease: Secondary | ICD-10-CM | POA: Diagnosis not present

## 2020-12-26 DIAGNOSIS — I5033 Acute on chronic diastolic (congestive) heart failure: Secondary | ICD-10-CM | POA: Diagnosis not present

## 2020-12-26 DIAGNOSIS — I69354 Hemiplegia and hemiparesis following cerebral infarction affecting left non-dominant side: Secondary | ICD-10-CM | POA: Diagnosis not present

## 2020-12-26 DIAGNOSIS — Z794 Long term (current) use of insulin: Secondary | ICD-10-CM | POA: Diagnosis not present

## 2020-12-26 DIAGNOSIS — Z466 Encounter for fitting and adjustment of urinary device: Secondary | ICD-10-CM | POA: Diagnosis not present

## 2020-12-26 DIAGNOSIS — N1832 Chronic kidney disease, stage 3b: Secondary | ICD-10-CM | POA: Diagnosis not present

## 2020-12-26 DIAGNOSIS — S90411D Abrasion, right great toe, subsequent encounter: Secondary | ICD-10-CM | POA: Diagnosis not present

## 2020-12-26 DIAGNOSIS — G20 Parkinson's disease: Secondary | ICD-10-CM | POA: Diagnosis not present

## 2020-12-26 DIAGNOSIS — L89892 Pressure ulcer of other site, stage 2: Secondary | ICD-10-CM | POA: Diagnosis not present

## 2020-12-30 DIAGNOSIS — B3749 Other urogenital candidiasis: Secondary | ICD-10-CM | POA: Diagnosis not present

## 2020-12-30 DIAGNOSIS — J9690 Respiratory failure, unspecified, unspecified whether with hypoxia or hypercapnia: Secondary | ICD-10-CM | POA: Diagnosis not present

## 2020-12-30 DIAGNOSIS — L89300 Pressure ulcer of unspecified buttock, unstageable: Secondary | ICD-10-CM | POA: Diagnosis not present

## 2021-01-02 DIAGNOSIS — L89892 Pressure ulcer of other site, stage 2: Secondary | ICD-10-CM | POA: Diagnosis not present

## 2021-01-02 DIAGNOSIS — G20 Parkinson's disease: Secondary | ICD-10-CM | POA: Diagnosis not present

## 2021-01-02 DIAGNOSIS — I69354 Hemiplegia and hemiparesis following cerebral infarction affecting left non-dominant side: Secondary | ICD-10-CM | POA: Diagnosis not present

## 2021-01-02 DIAGNOSIS — I5033 Acute on chronic diastolic (congestive) heart failure: Secondary | ICD-10-CM | POA: Diagnosis not present

## 2021-01-02 DIAGNOSIS — N1832 Chronic kidney disease, stage 3b: Secondary | ICD-10-CM | POA: Diagnosis not present

## 2021-01-02 DIAGNOSIS — Z466 Encounter for fitting and adjustment of urinary device: Secondary | ICD-10-CM | POA: Diagnosis not present

## 2021-01-02 DIAGNOSIS — E1122 Type 2 diabetes mellitus with diabetic chronic kidney disease: Secondary | ICD-10-CM | POA: Diagnosis not present

## 2021-01-02 DIAGNOSIS — I130 Hypertensive heart and chronic kidney disease with heart failure and stage 1 through stage 4 chronic kidney disease, or unspecified chronic kidney disease: Secondary | ICD-10-CM | POA: Diagnosis not present

## 2021-01-02 DIAGNOSIS — Z794 Long term (current) use of insulin: Secondary | ICD-10-CM | POA: Diagnosis not present

## 2021-01-02 DIAGNOSIS — S90411D Abrasion, right great toe, subsequent encounter: Secondary | ICD-10-CM | POA: Diagnosis not present

## 2021-01-03 ENCOUNTER — Other Ambulatory Visit: Payer: Self-pay

## 2021-01-03 ENCOUNTER — Encounter (HOSPITAL_BASED_OUTPATIENT_CLINIC_OR_DEPARTMENT_OTHER): Payer: Medicare Other | Admitting: Internal Medicine

## 2021-01-03 DIAGNOSIS — I69954 Hemiplegia and hemiparesis following unspecified cerebrovascular disease affecting left non-dominant side: Secondary | ICD-10-CM | POA: Diagnosis not present

## 2021-01-03 DIAGNOSIS — C7951 Secondary malignant neoplasm of bone: Secondary | ICD-10-CM | POA: Diagnosis not present

## 2021-01-03 DIAGNOSIS — L97528 Non-pressure chronic ulcer of other part of left foot with other specified severity: Secondary | ICD-10-CM | POA: Diagnosis not present

## 2021-01-03 DIAGNOSIS — E1151 Type 2 diabetes mellitus with diabetic peripheral angiopathy without gangrene: Secondary | ICD-10-CM | POA: Diagnosis not present

## 2021-01-03 DIAGNOSIS — L97514 Non-pressure chronic ulcer of other part of right foot with necrosis of bone: Secondary | ICD-10-CM | POA: Diagnosis not present

## 2021-01-03 DIAGNOSIS — L0231 Cutaneous abscess of buttock: Secondary | ICD-10-CM | POA: Diagnosis not present

## 2021-01-03 DIAGNOSIS — S31829D Unspecified open wound of left buttock, subsequent encounter: Secondary | ICD-10-CM | POA: Diagnosis not present

## 2021-01-03 NOTE — Progress Notes (Signed)
[name], [name] ([id number]) . Visit Report for 01/03/2021 HPI Details Patient Name: Date of Service: [name], [name] [facility] [name]. 01/03/2021 2:30 PM Medical Record Number: [id number] Patient Account Number: [id number] Date of Birth/Sex: Treating RN: 1945-11-29 (75 y.o. [name] Primary Care Provider: [name] Other Clinician: Referring Provider: Treating Provider/Extender: [name] in Treatment: 54 History of Present Illness HPI Description: ADMISSION 07/27/2020 This is a 75 year old man who is accompanied by his wife (ex). Nevertheless she is his active caregiver. His most complicated features are remote CVA that left him with a left hemiparesis. He also has widely metastatic prostate cancer to bone which I think is contributed to increasing frailty this year and he is no longer ambulatory. He required hospitalization from 9/7 through 9/14 with respiratory failure seizures. During this time it was noted that he had an open area in the left buttock in close proximity to the gluteal cleft. His wife tells me that he has a history of perirectal abscesses. And she describes this area as starting as a painful swelling in late August certainly sounds like an abscess. When he is in the hospital he required bedside IandD's but did not go to the OR. Has CT scan of the area showed soft tissue thickening in the perianal ulcer. At that point it was concerning for a fistula connection however a discrete abscess was not seen. Notable that he was hypoalbuminemic in the hospital but actually came out better with an albumin of 3.1 on 9/14 his wife says he is eating well. They are currently treating this with Santyl ointment and a backing wet-to-dry dressing. By description of his wife this is done quite nicely and there is certainly less debris over the wound surface. They already have a hospital bed with a level 3 pressure relief surface. The patient lies on his back when he  is in bed especially at night although his wife is counseled him not to do this. They are starting sliding board transfers into his wheelchair I think with physical therapy. He has a Foley catheter in place Past medical history includes remote CVA, metastatic prostate cancer to large areas of his thoracic and lumbar spine and pelvis., Bilateral carotid artery stenosis, Parkinson's disease, type 2 diabetes, diastolic heart failure, CHF, hypertension and a recently discovered possible pancreatic mass in the head of the pancreas 08/16/2020 patient I admitted to the clinic 2-1/2 weeks ago. He had an abscess site on his left buttock that required an IandD while he was in the hospital in September. He has been using Santyl backing wet-to-dry. He is cared for her aerobically at home by his wife 2 concerning areas on his feet. A stage I pressure injury on the right heel this does not have an open wound. He also has on the lateral aspect of the left fourth toe a dry circular eschared wound. 12/2; monthly follow-up. Is a very disabled man who had an abscess in his left buttock and required an IandD. We have been using Santyl to this area I changed to silver collagen last time he is here and this actually looks quite a bit better The last time he was here his wife showed me an area on the left lateral fourth toe I think a pressure injury with his fifth toe. Been using Santyl here as well but not making much improvement As well she shows me he has an area on his right buttock which is very tiny pinpoint area but under illumination still  obviously an open area. I wonder if this is more of a shear injury and transferring and a pressure injury per se 12/16; this is a very disabled man who has a left buttock area that required an IandD apparently an abscess. Since he has come here that he developed a area on the right buttock. His wife says that this is draining pus. Finally he has an area on the lateral part of the  left fourth toe. We have been using silver collagen on the buttock and Iodoflex on the fourth toe 1/6; this is a disabled man we have been working on a left buttock area that was initially an IandD abscess injury. We have got this down to a small area in fact it appears to be epithelializing towards the base of the wound. When he was here last time he had a small draining area on the right buttock. I thought this might be a small cyst culture of the drainage was negative. I do not think this was an abscess at all While he has been here he has been developing areas on the toes on his left foot. He initially had one on the left fourth toe and then last time the right first toe. He is a type II diabetic. With he has a history of coronary artery disease. He complains of a lot of pain in the right foot 1/13; we have been working on areas on his left buttock and then on the right buttock. Both of these are healed today albeit the area on the left has some depth with skin is gone into a divot. Nevertheless I think this is a reasonable outcome. He arrived a week ago with ischemic looking wounds on his right fifth, right first and his left fourth toe. He went on to have arterial studies which we received today these showed an ABI that was noncompressible on both sides however the great toe on the right had a TBI of 0.14 with a pressure of only 17. Monophasic waveforms on all the tibial vessels and including the popliteal on the left he had monophasic waveforms again noncompressible vessels and no recorded toe pressure. Clearly these are ischemic wounds. Fortunately he is not complaining of a lot of pain The patient has a complicated medical situation which includes widely metastatic prostate cancer to bone for which she is on palliative care he also has Parkinson's disease. I have a recent note from his neurologist at which time he asked whether they wish to de-escalate the Parkinson's regimen and the  answer was no. The patient has a follow-up appointment with Dr. Gwenlyn Found on 1/28. I believe he is the patient's cardiologist but this appointment was made because of the vascular studies I think predominantly through Dr. Kennon Holter office. 2/10; patient went back to see Dr. Gwenlyn Found. He did not feel given his comorbidities that he was a candidate for endovascular therapy. His wife seems to agree with that. He has ischemic wounds on his right toes particularly the right fifth toe fourth toe and first toe. He is not in a lot of pain unless these are manipulated 12/20/2020 on evaluation today patient appears to be doing well with regard to his feet all things considered. There does not appear to be any obvious signs of infection here. Most of the wounds are actually for the most part eschar and dry. With that being said the patient is not actually a candidate for revascularization according to Dr. Gwenlyn Found who he has seen previous. Nonetheless  I think that for the time being Betadine or something of that sort just keep things clean and dry will be a good way to go. 3/24; patient has 3 areas on the dorsal right toes first fourth and fifth that are necrotic probably ischemic. He was not felt to be a candidate for revascularization therapy. He also has an area on the left fourth toe in a similar situation. His significant other is concerned about possibility of an abscess on his buttock. He was put on antibiotics last time. I must say that when he was here before he seemed to have recurrent small abscesses but the purulence in this culture is negative I wondered about an epidermoid cyst although this area currently is not in the same spot Electronic Signature(s) Signed: 01/03/2021 4:40:49 PM By: Linton Ham MD Entered By: Linton Ham on 01/03/2021 15:39:32 -------------------------------------------------------------------------------- Physical Exam Details Patient Name: Date of Service: [name].  01/03/2021 2:30 PM Medical Record Number: [id number] Patient Account Number: [id number] Date of Birth/Sex: Treating RN: 07-May-1946 (75 y.o. [name] Primary Care Provider: [name] Other Clinician: Referring Provider: Treating Provider/Extender: [name] in Treatment: 22 Constitutional Sitting or standing Blood Pressure is within target range for patient.. Pulse regular and within target range for patient.Marland Kitchen Respirations regular, non-labored and within target range.. Temperature is normal and within the target range for the patient.. Eyes No scleral icterus. Pupils equal, symmetric, and react to light.. Ears, Nose, Mouth, and Throat . Notes Wound exam; he has eschar on the dorsal first fourth and fifth toes on the right this does not look too much different to me. Nothing in the area looks particularly ominous. He does have the area in the gluteal cleft that is firm but nontender there is no open area here. Electronic Signature(s) Signed: 01/03/2021 4:40:49 PM By: Linton Ham MD Entered By: Linton Ham on 01/03/2021 15:40:54 -------------------------------------------------------------------------------- Physician Orders Details Patient Name: Date of Service: [name]. 01/03/2021 2:30 PM Medical Record Number: [id number] Patient Account Number: [id number] Date of Birth/Sex: Treating RN: [birth year] (75 y.o. [name], [name] Primary Care Provider: [name] Other Clinician: Referring Provider: Treating Provider/Extender: [name] in Treatment: 48 Verbal / Phone Orders: No Diagnosis Coding ICD-10 Coding Code Description L02.31 Cutaneous abscess of buttock S31.829D Unspecified open wound of left buttock, subsequent encounter L97.528 Non-pressure chronic ulcer of other part of left foot with other specified severity L97.514 Non-pressure chronic ulcer of other part of right foot with necrosis of  bone E11.51 Type 2 diabetes mellitus with diabetic peripheral angiopathy without gangrene Follow-up Appointments Return appointment in 1 month. Off-Loading Low air-loss mattress (Group 2) - CONTINUE TO USE. Turn and reposition every 2 hours Other: - CONTINUE TO USE BUNNY BOOTS FOR HEEL PROTECTION. UP IN WHEELCHAIR NO MORE THAT 2 HOUR INCREMENTS. Ensure to relieve pressure off buttock closed areas to prevent reopening. Home Health New wound care orders this week; continue Home Health for wound care. May utilize formulary equivalent dressing for wound treatment orders unless otherwise specified. - [facility] weekly. Wound Treatment Wound #2 - [name] Fourth oe Wound Laterality: Left, Lateral Cleanser: Wound Cleanser (Home Health) 1 x Per Day/30 Days Discharge Instructions: Cleanse the wound with wound cleanser prior to applying a clean dressing using gauze sponges, not tissue or cotton balls. Prim Dressing: Betadine 1 x Per Day/30 Days ary Discharge Instructions: apply betadine to wound. Secondary Dressing: Woven Gauze Sponges 2x2 in [facility]) 1 x  Per Day/30 Days Discharge Instructions: Apply over primary dressing as directed. Secured With: Child psychotherapist, Sterile 2x75 (in/in) (Home Health) 1 x Per Day/30 Days Discharge Instructions: Secure with stretch gauze as directed. Wound #4 - [name] Great oe Wound Laterality: Right Cleanser: Wound Cleanser (Home Health) 1 x Per Day/30 Days Discharge Instructions: Cleanse the wound with wound cleanser prior to applying a clean dressing using gauze sponges, not tissue or cotton balls. Prim Dressing: Betadine 1 x Per Day/30 Days ary Discharge Instructions: apply betadine to wound. Secondary Dressing: Woven Gauze Sponges 2x2 in (Home Health) 1 x Per Day/30 Days Discharge Instructions: Apply over primary dressing as directed. Secured With: Child psychotherapist, Sterile 2x75 (in/in) (Home Health) 1 x Per Day/30  Days Discharge Instructions: Secure with stretch gauze as directed. Wound #5 - [name] Fifth oe Wound Laterality: Right Cleanser: Wound Cleanser (Home Health) 1 x Per Day/30 Days Discharge Instructions: Cleanse the wound with wound cleanser prior to applying a clean dressing using gauze sponges, not tissue or cotton balls. Prim Dressing: Betadine 1 x Per Day/30 Days ary Discharge Instructions: apply betadine to wound. Secondary Dressing: Woven Gauze Sponges 2x2 in (Home Health) 1 x Per Day/30 Days Discharge Instructions: Apply over primary dressing as directed. Secured With: Child psychotherapist, Sterile 2x75 (in/in) (Home Health) 1 x Per Day/30 Days Discharge Instructions: Secure with stretch gauze as directed. Wound #6 - [name] Fourth oe Wound Laterality: Right Cleanser: Wound Cleanser (Home Health) 1 x Per Day/30 Days Discharge Instructions: Cleanse the wound with wound cleanser prior to applying a clean dressing using gauze sponges, not tissue or cotton balls. Prim Dressing: Betadine 1 x Per Day/30 Days ary Discharge Instructions: apply betadine to wound. Secondary Dressing: Woven Gauze Sponges 2x2 in (Home Health) 1 x Per Day/30 Days Discharge Instructions: Apply over primary dressing as directed. Secured With: Child psychotherapist, Sterile 2x75 (in/in) (Home Health) 1 x Per Day/30 Days Discharge Instructions: Secure with stretch gauze as directed. Electronic Signature(s) Signed: 01/03/2021 4:40:49 PM By: Linton Ham MD Signed: 01/03/2021 4:43:39 PM By: Deon Pilling Entered By: Deon Pilling on 01/03/2021 15:22:14 -------------------------------------------------------------------------------- Problem List Details Patient Name: Date of Service: [name]. 01/03/2021 2:30 PM Medical Record Number: [id number] Patient Account Number: [id number] Date of Birth/Sex: Treating RN: [birth year] (75 y.o. [name] Primary Care Provider: [name] Other  Clinician: Referring Provider: Treating Provider/Extender: [name] in Treatment: 22 Active Problems ICD-10 Encounter Code Description Active Date MDM Diagnosis L02.31 Cutaneous abscess of buttock 07/27/2020 No Yes S31.829D Unspecified open wound of left buttock, subsequent encounter 07/27/2020 No Yes L97.528 Non-pressure chronic ulcer of other part of left foot with other specified 08/16/2020 No Yes severity L97.514 Non-pressure chronic ulcer of other part of right foot with necrosis of bone 10/18/2020 No Yes E11.51 Type 2 diabetes mellitus with diabetic peripheral angiopathy without gangrene 10/18/2020 No Yes Inactive Problems ICD-10 Code Description Active Date Inactive Date L89.611 Pressure ulcer of right heel, stage 1 08/16/2020 08/16/2020 L89.312 Pressure ulcer of right buttock, stage 2 09/13/2020 09/13/2020 Resolved Problems Electronic Signature(s) Signed: 01/03/2021 4:40:49 PM By: Linton Ham MD Entered By: Linton Ham on 01/03/2021 15:36:37 -------------------------------------------------------------------------------- Progress Note Details Patient Name: Date of Service: [name]. 01/03/2021 2:30 PM Medical Record Number: [id number] Patient Account Number: [id number] Date of Birth/Sex: Treating RN: 10/29/1945 (75 y.o. [name] Primary Care Provider: [name] Other Clinician: Referring Provider: Treating Provider/Extender: [name] in Treatment:  22 Subjective History of Present Illness (HPI) ADMISSION 07/27/2020 This is a 75 year old man who is accompanied by his wife (ex). Nevertheless she is his active caregiver. His most complicated features are remote CVA that left him with a left hemiparesis. He also has widely metastatic prostate cancer to bone which I think is contributed to increasing frailty this year and he is no longer ambulatory. He required hospitalization from 9/7 through  9/14 with respiratory failure seizures. During this time it was noted that he had an open area in the left buttock in close proximity to the gluteal cleft. His wife tells me that he has a history of perirectal abscesses. And she describes this area as starting as a painful swelling in late August certainly sounds like an abscess. When he is in the hospital he required bedside IandD's but did not go to the OR. Has CT scan of the area showed soft tissue thickening in the perianal ulcer. At that point it was concerning for a fistula connection however a discrete abscess was not seen. Notable that he was hypoalbuminemic in the hospital but actually came out better with an albumin of 3.1 on 9/14 his wife says he is eating well. They are currently treating this with Santyl ointment and a backing wet-to-dry dressing. By description of his wife this is done quite nicely and there is certainly less debris over the wound surface. They already have a hospital bed with a level 3 pressure relief surface. The patient lies on his back when he is in bed especially at night although his wife is counseled him not to do this. They are starting sliding board transfers into his wheelchair I think with physical therapy. He has a Foley catheter in place Past medical history includes remote CVA, metastatic prostate cancer to large areas of his thoracic and lumbar spine and pelvis., Bilateral carotid artery stenosis, Parkinson's disease, type 2 diabetes, diastolic heart failure, CHF, hypertension and a recently discovered possible pancreatic mass in the head of the pancreas 08/16/2020 patient I admitted to the clinic 2-1/2 weeks ago. He had an abscess site on his left buttock that required an IandD while he was in the hospital in September. He has been using Santyl backing wet-to-dry. He is cared for her aerobically at home by his wife 2 concerning areas on his feet. A stage I pressure injury on the right heel this does not  have an open wound. He also has on the lateral aspect of the left fourth toe a dry circular eschared wound. 12/2; monthly follow-up. Is a very disabled man who had an abscess in his left buttock and required an IandD. We have been using Santyl to this area I changed to silver collagen last time he is here and this actually looks quite a bit better ooThe last time he was here his wife showed me an area on the left lateral fourth toe I think a pressure injury with his fifth toe. Been using Santyl here as well but not making much improvement ooAs well she shows me he has an area on his right buttock which is very tiny pinpoint area but under illumination still obviously an open area. I wonder if this is more of a shear injury and transferring and a pressure injury per se 12/16; this is a very disabled man who has a left buttock area that required an IandD apparently an abscess. Since he has come here that he developed a area on the right buttock. His wife says  that this is draining pus. Finally he has an area on the lateral part of the left fourth toe. We have been using silver collagen on the buttock and Iodoflex on the fourth toe 1/6; this is a disabled man we have been working on a left buttock area that was initially an IandD abscess injury. We have got this down to a small area in fact it appears to be epithelializing towards the base of the wound. When he was here last time he had a small draining area on the right buttock. I thought this might be a small cyst culture of the drainage was negative. I do not think this was an abscess at all While he has been here he has been developing areas on the toes on his left foot. He initially had one on the left fourth toe and then last time the right first toe. He is a type II diabetic. With he has a history of coronary artery disease. He complains of a lot of pain in the right foot 1/13; we have been working on areas on his left buttock and then on the  right buttock. Both of these are healed today albeit the area on the left has some depth with skin is gone into a divot. Nevertheless I think this is a reasonable outcome. He arrived a week ago with ischemic looking wounds on his right fifth, right first and his left fourth toe. He went on to have arterial studies which we received today these showed an ABI that was noncompressible on both sides however the great toe on the right had a TBI of 0.14 with a pressure of only 17. Monophasic waveforms on all the tibial vessels and including the popliteal on the left he had monophasic waveforms again noncompressible vessels and no recorded toe pressure. Clearly these are ischemic wounds. Fortunately he is not complaining of a lot of pain The patient has a complicated medical situation which includes widely metastatic prostate cancer to bone for which she is on palliative care he also has Parkinson's disease. I have a recent note from his neurologist at which time he asked whether they wish to de-escalate the Parkinson's regimen and the answer was no. The patient has a follow-up appointment with Dr. Gwenlyn Found on 1/28. I believe he is the patient's cardiologist but this appointment was made because of the vascular studies I think predominantly through Dr. Kennon Holter office. 2/10; patient went back to see Dr. Gwenlyn Found. He did not feel given his comorbidities that he was a candidate for endovascular therapy. His wife seems to agree with that. He has ischemic wounds on his right toes particularly the right fifth toe fourth toe and first toe. He is not in a lot of pain unless these are manipulated 12/20/2020 on evaluation today patient appears to be doing well with regard to his feet all things considered. There does not appear to be any obvious signs of infection here. Most of the wounds are actually for the most part eschar and dry. With that being said the patient is not actually a candidate for revascularization  according to Dr. Gwenlyn Found who he has seen previous. Nonetheless I think that for the time being Betadine or something of that sort just keep things clean and dry will be a good way to go. 3/24; patient has 3 areas on the dorsal right toes first fourth and fifth that are necrotic probably ischemic. He was not felt to be a candidate for revascularization therapy. He also has  an area on the left fourth toe in a similar situation. His significant other is concerned about possibility of an abscess on his buttock. He was put on antibiotics last time. I must say that when he was here before he seemed to have recurrent small abscesses but the purulence in this culture is negative I wondered about an epidermoid cyst although this area currently is not in the same spot Objective Constitutional Sitting or standing Blood Pressure is within target range for patient.. Pulse regular and within target range for patient.Marland Kitchen Respirations regular, non-labored and within target range.. Temperature is normal and within the target range for the patient.. Vitals Time Taken: 2:40 PM, Height: 64 in, Weight: 150 lbs, BMI: 25.7, Temperature: 97.9 F, Pulse: 67 bpm, Respiratory Rate: 15 breaths/min, Blood Pressure: 117/69 mmHg. Eyes No scleral icterus. Pupils equal, symmetric, and react to light.. General Notes: Wound exam; he has eschar on the dorsal first fourth and fifth toes on the right this does not look too much different to me. ooNothing in the area looks particularly ominous. He does have the area in the gluteal cleft that is firm but nontender there is no open area here. Integumentary (Hair, Skin) Wound #2 status is Open. Original cause of wound was Gradually Appeared. The date acquired was: 08/16/2020. The wound has been in treatment 20 weeks. The wound is located on the Left,Lateral [name] Fourth. The wound measures 0.5cm length x 0.3cm width x 0.1cm depth; 0.118cm^2 area and 0.012cm^3 volume. oe There is no tunneling  or undermining noted. There is a none present amount of drainage noted. The wound margin is flat and intact. There is no granulation within the wound bed. There is no necrotic tissue within the wound bed. Wound #4 status is Open. Original cause of wound was Not Known. The date acquired was: 10/11/2020. The wound has been in treatment 11 weeks. The wound is located on the Right [name] Great. The wound measures 0.6cm length x 0.6cm width x 0.1cm depth; 0.283cm^2 area and 0.028cm^3 volume. There is bone and oe Fat Layer (Subcutaneous Tissue) exposed. There is no tunneling or undermining noted. There is a none present amount of drainage noted. The wound margin is flat and intact. There is no granulation within the wound bed. There is a large (67-100%) amount of necrotic tissue within the wound bed including Eschar. Wound #5 status is Open. Original cause of wound was Not Known. The date acquired was: 10/11/2020. The wound has been in treatment 11 weeks. The wound is located on the Right [name] Fifth. The wound measures 1cm length x 1.1cm width x 0.1cm depth; 0.864cm^2 area and 0.086cm^3 volume. There is Fat Layer oe (Subcutaneous Tissue) exposed. There is no tunneling or undermining noted. There is a small amount of serosanguineous drainage noted. The wound margin is distinct with the outline attached to the wound base. There is no granulation within the wound bed. There is a large (67-100%) amount of necrotic tissue within the wound bed including Eschar. Wound #6 status is Open. Original cause of wound was Gradually Appeared. The date acquired was: 11/22/2020. The wound has been in treatment 6 weeks. The wound is located on the Right [name] Fourth. The wound measures 0.4cm length x 1.1cm width x 0.1cm depth; 0.346cm^2 area and 0.035cm^3 volume. There is oe Fat Layer (Subcutaneous Tissue) exposed. There is no tunneling or undermining noted. There is a medium amount of serosanguineous drainage noted. The wound margin is  distinct with the outline attached to the wound  base. There is small (1-33%) red granulation within the wound bed. There is a large (67-100%) amount of necrotic tissue within the wound bed including Eschar. 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 Off-Loading: Low air-loss mattress (Group 2) - CONTINUE TO USE. Turn and reposition every 2 hours Other: - CONTINUE TO USE BUNNY BOOTS FOR HEEL PROTECTION. UP IN WHEELCHAIR NO MORE THAT 2 HOUR INCREMENTS. Ensure to relieve pressure off buttock closed areas to prevent reopening. Home Health: New wound care orders this week; continue Home Health for wound care. May utilize formulary equivalent dressing for wound treatment orders unless otherwise specified. - [facility] weekly. WOUND #2: - [name] Fourth Wound Laterality: Left, Lateral oe Cleanser: Wound Cleanser (Home Health) 1 x Per Day/30 Days Discharge Instructions: Cleanse the wound with wound cleanser prior to applying a clean dressing using gauze sponges, not tissue or cotton balls. Prim Dressing: Betadine 1 x Per Day/30 Days ary Discharge Instructions: apply betadine to wound. Secondary Dressing: Woven Gauze Sponges 2x2 in (Home Health) 1 x Per Day/30 Days Discharge Instructions: Apply over primary dressing as directed. Secured With: Child psychotherapist, Sterile 2x75 (in/in) (Home Health) 1 x Per Day/30 Days Discharge Instructions: Secure with stretch gauze as directed. WOUND #4: - [name] Great Wound Laterality: Right oe Cleanser: Wound Cleanser (Home Health) 1 x Per Day/30 Days Discharge  Instructions: Cleanse the wound with wound cleanser prior to applying a clean dressing using gauze sponges, not tissue or cotton balls. Prim Dressing: Betadine 1 x Per Day/30 Days ary Discharge Instructions: apply betadine to wound. Secondary Dressing: Woven Gauze Sponges 2x2 in (Home Health) 1 x Per Day/30 Days Discharge Instructions: Apply over primary dressing as directed. Secured With: Child psychotherapist, Sterile 2x75 (in/in) (Home Health) 1 x Per Day/30 Days Discharge Instructions: Secure with stretch gauze as directed. WOUND #5: - [name] Fifth Wound Laterality: Right oe Cleanser: Wound Cleanser (Home Health) 1 x Per Day/30 Days Discharge Instructions: Cleanse the wound with wound cleanser prior to applying a clean dressing using gauze sponges, not tissue or cotton balls. Prim Dressing: Betadine 1 x Per Day/30 Days ary Discharge Instructions: apply betadine to wound. Secondary Dressing: Woven Gauze Sponges 2x2 in (Home Health) 1 x Per Day/30 Days Discharge Instructions: Apply over primary dressing as directed. Secured With: Child psychotherapist, Sterile 2x75 (in/in) (Home Health) 1 x Per Day/30 Days Discharge Instructions: Secure with stretch gauze as directed. WOUND #6: - [name] Fourth oe Wound Laterality: Right Cleanser: Wound Cleanser (Home Health) 1 x Per Day/30 Days Discharge Instructions: Cleanse the wound with wound cleanser prior to applying a clean dressing using gauze sponges, not tissue or cotton balls. Prim Dressing: Betadine 1 x Per Day/30 Days ary Discharge Instructions: apply betadine to wound. Secondary Dressing: Woven Gauze Sponges 2x2 in (Home Health) 1 x Per Day/30 Days Discharge Instructions: Apply over primary dressing as directed. Secured With: Child psychotherapist, Sterile 2x75 (in/in) (Home Health) 1 x Per Day/30 Days Discharge Instructions: Secure with stretch gauze as directed. 1. Ischemic areas to both feet. He was refused for  endovascular therapy because of comorbidities. 2. This was done with the approval of his wife 3.  He has areas on his right buttock predominantly but also the left buttock which look like some form of benign cyst to me. There was one larger one last week that necessitated fear of an abscess nevertheless these areas are nontender. He did receive a course of antibiotics 4. Worthwhile noting that when I have cultured these areas in the past they have been negative Electronic Signature(s) Signed: 01/03/2021 4:40:49 PM By: Linton Ham MD Entered By: Linton Ham on 01/03/2021 15:50:51 -------------------------------------------------------------------------------- SuperBill Details Patient Name: Date of Service: Alycia Patten [name]. 01/03/2021 Medical Record Number: [id number] Patient Account Number: [id number] Date of Birth/Sex: Treating RN: 08-16-1946 (75 y.o. [name], [name] Primary Care Provider: [name] Other Clinician: Referring Provider: Treating Provider/Extender: [name] in Treatment: 22 Diagnosis Coding ICD-10 Codes Code Description L02.31 Cutaneous abscess of buttock S31.829D Unspecified open wound of left buttock, subsequent encounter L97.528 Non-pressure chronic ulcer of other part of left foot with other specified severity L97.514 Non-pressure chronic ulcer of other part of right foot with necrosis of bone E11.51 Type 2 diabetes mellitus with diabetic peripheral angiopathy without gangrene Facility Procedures CPT4 Code: [id number] Description: [phone] - WOUND CARE VISIT-LEV 5 EST PT Modifier: Quantity: 1 Physician Procedures : CPT4 Code Description Modifier [id number] - WC PHYS LEVEL 3 - EST PT ICD-10 Diagnosis Description S31.829D Unspecified open wound of left buttock, subsequent encounter L97.528 Non-pressure chronic ulcer of other part of left foot with other  specified severity L97.514 Non-pressure chronic ulcer of other part  of right foot with necrosis of bone Quantity: 1 Electronic Signature(s) Signed: 01/03/2021 4:40:49 PM By: Linton Ham MD Entered By: Linton Ham on 01/03/2021 15:51:15

## 2021-01-08 ENCOUNTER — Other Ambulatory Visit: Payer: Medicare Other | Admitting: Nurse Practitioner

## 2021-01-08 ENCOUNTER — Other Ambulatory Visit: Payer: Self-pay

## 2021-01-08 DIAGNOSIS — R310 Gross hematuria: Secondary | ICD-10-CM | POA: Diagnosis not present

## 2021-01-08 DIAGNOSIS — Z515 Encounter for palliative care: Secondary | ICD-10-CM

## 2021-01-08 NOTE — Progress Notes (Signed)
[facility] Consult Note Telephone: [phone]  Fax: [phone]  PATIENT NAME: [name] [place] [address] [phone] (home)  DOB: 1946-08-13 MRN: [id number]  PRIMARY CARE PROVIDER:    [name], MD,  [place] [address] [phone]  REFERRING PROVIDER:   [name], MD [address] [place],  [place] [address] [phone]  RESPONSIBLE PARTY:   Extended Emergency Contact Information Primary Emergency Contact: [name] Address: [address]          [place], [place] [address] [name] of [place] Phone: [phone] Mobile Phone: [phone] Relation: Relative Secondary Emergency Contact: [name] Mobile Phone: [phone] Relation: Daughter  I met face to face with patient and ex-wife in home.  ASSESSMENT AND RECOMMENDATIONS:   Advance Care Planning: Goal of care: Goal of care is function. Directives: Code status is full code. Patient desires full resuscitation effort in the event of cardiac or respiratory arrest.  Symptom Management:  Hematuria: Family report symptoms occurring more often in the last one month. No associated symptoms like abdominal pain, fever, chills, or any other acute bleed. Patient with metastatic Prostate cancer followed by Urology, on Lupron injections.  Recommendation: continue supportive care. Follow up with Urology as scheduled. Wounds: Ongoing wound to left 5th toe, left 4th toe, left great toe, and right 5th toes. New draining abscess to left groin. Wounds on toes is slow healing due to poor circulation, followed by [name] with wound clinic.  Continue current plan of care, including close management of diabetes.  Right side weakness: related to hx of CVA. Family report procurement of a handicap assessible Lucianne Lei and a Interior and spatial designer  wheelchair. Patient happy that he is now mobile and able to ride on the [place] wheelchair while his family is taking walk within his neighborhood.  Patient voiced no other concerns today. Provided general support and encouragement, no other unmet needs identified at this time. Questions and concerns were addressed. Patient and family encouraged to call with questions and/or concerns.  Follow up Palliative Care Visit: Palliative care will continue to follow for complex decision making and symptom management. Return in about 6 weeks or prn.  Family /Caregiver/Community Supports: Patient lives at [employer] his ex-wifeJacquelynwhois his main caregiver.Jacquelynvery knowledgeable about patient'scondition and coordinates his care.  I spent 30 minutes providing this consultation, time includes time spent with patient/family, chart review, provider coordination, and documentation. More than 50% of the time in this consultation was spent counseling and coordinating communication.   CHIEF COMPLAINT: Hematuria  History obtained from review of [name] and  discussion with patient and family. Records reviewed and summarized bellow.  HISTORY OF PRESENT ILLNESS:Luis T Cheshireis a 75 y.o.yearar old malewith multiple medical problems including Prostate cancerwith metsbones and pelvis ( on Lupron injections),CHF (EF 60-65%), Parkinson disease,Type 2 diabetes, seizure disorder, leftarm paralysisfrom prior CVA.Family report reoccurrence of hematuria, report symptoms reoccurs intermittently. Family report Urology was made aware-recommended monitoring. Family report up coming appointment with urology next week. Palliative Care following patient to help address advance care planning,goals of care and symptoms management.This is a follow up visit.  CODE STATUS: Full Code  PPS: 50% weak  HOSPICE ELIGIBILITY/DIAGNOSIS: TBD  ROS/staff/patient  EYES: denies vision changes ENMT: denies  dysphagia Cardiovascular: denied chest pain, denied palpitation Pulmonary: denied cough, denied increased SOB Abdomen: endorses good appetite, denied constipation, denied incontinence of bowel GU:  Endorsed occasional bladder spasm MSK: endorses ROM limitations, no falls reported Skin: denies rashes  Neurological: endorses weakness, denies pain, denies insomnia Psych: Endorses positive mood Heme/lymph/immuno: denies bruises, abnormal bleeding  Physical Exam: Weight: 140lbs General: frail appearing, lying in bed in NAD EYES: anicteric sclera, lids intact, no discharge  ENMT: intact hearing, oral mucous membranes moist CV: no LE edema Pulmonary: no increased work of breathing, no cough, no audible wheezes, room air Abdomen: intake 100%, no ascites GU: chronic foley cath, urine clear and yellow AJO:[id number], decreased ROM in all extremities, left arm contracture and paralysis, non ambulatory Skin: warm and dry, no rashes, dressing on ulcer on right great toes 4th toes intact Neuro: weakness Psych: non-anxious affecttoday, A and O x 3 Hem/lymph/immuno: no widespread bruising    PAST MEDICAL HISTORY:  Past Medical History:  Diagnosis Date  . At high risk for falls   . Bladder cancer [facility])    urologist-  [name]  . CAD (coronary artery disease) cardiologist-  [name]  . Cerebrovascular arteriosclerosis   . Gait disturbance, post-stroke    uses cane, rollator, and w/c long distance  . Hemiparkinsonism [facility]) neurologist-- [name]   right side body  . History of basal cell carcinoma excision    Sept 2016--  MOH's surgery side of nose  . History of carotid artery stenosis cardiologist-- [name]   bilateral --  1999 s/p  bilateral ICA endarterectomy and recurrent restenosis 2012  s/p  staged bilateral stenting   per last duplex 2015  stents widely patent  . History of CVA with residual deficit neurologist-  [name]   3/ 1999  Right MCA  and  12/ 2004   Anterior division of  Right MCA ---  residual left spactic hemiparesis and left foot drop  . History of MI (myocardial infarction)    02/ 2004   s/p  cabg   . HOH (hard of hearing)    LEFT EAR  POST cva  . Hyperextension deformity of left knee    wears brace  . Hyperlipidemia   . Hypertension   . Left foot drop    residual from CVA  -- wears brace  . Left spastic hemiparesis ([place])    residual CVA 1999  . Pancreatic pseudocyst   . Peripheral arterial disease ([place])   . Prostate cancer ([place])   . S/P CABG x 4    02/ 2004  . Seizure disorder [facility]) last seizure 2011 due to confusion   neurologist-  [name]-- per note seizure documented 2008 breakthrough partial complex seizure (prior tonic-clonic seizure's post CVA)  . Type 2 diabetes, diet controlled ([place])     2  . Visual neglect    LEFT EYE    SOCIAL HX:  Social History   Tobacco Use  . Smoking status: Former Smoker    Packs/day: 0.25    Years: 5.00    Pack years: 1.25    Types: Cigarettes    Quit date: 11/14/2007    Years since quitting: 13.1  . Smokeless tobacco: Never Used  Substance Use Topics  . Alcohol use: No   FAMILY HX:  Family History  Problem Relation Age of Onset  . Stroke Father   . Heart attack Father   . Stroke Mother   . Diabetes Brother   . Hyperlipidemia Brother   . Hypertension Brother   . Hyperlipidemia Sister   . Hypertension Sister   . Breast cancer Neg Hx   .  Colon cancer Neg Hx   . Pancreatic cancer Neg Hx   . Prostate cancer Neg Hx     ALLERGIES:  Allergies  Allergen Reactions  . Altace [Ramipril] Cough  . Keflex [Cephalexin] Itching and Rash     PERTINENT MEDICATIONS:  Outpatient Encounter Medications as of 01/08/2021  Medication Sig  . acetaminophen (TYLENOL) 325 MG tablet Take 2 tablets (650 mg total) by mouth every 6 (six) hours as needed for mild pain (or Fever >/= 101).  Marland Kitchen amLODipine (NORVASC) 10 MG tablet TAKE ONE TABLET BY MOUTH DAILY  . carbidopa-levodopa (SINEMET  IR) 25-100 MG tablet Take 2 tablets by mouth 3 (three) times daily. Change: Take Two tabs 3 x daily  . collagenase (SANTYL) ointment Apply topically 2 (two) times daily.  . dantrolene (DANTRIUM) 50 MG capsule TAKE ONE CAPSULE BY MOUTH TWICE A DAY  . dipyridamole-aspirin (AGGRENOX) 200-25 MG 12hr capsule Take 1 capsule by mouth 2 (two) times daily.  Marland Kitchen ezetimibe (ZETIA) 10 MG tablet Take 1 tablet (10 mg total) by mouth daily.  . fenofibrate (TRICOR) 48 MG tablet TAKE ONE TABLET BY MOUTH DAILY  . FOLBIC 2.5-25-2 MG TABS tablet Take 1 tablet by mouth once daily with food  . furosemide (LASIX) 20 MG tablet TAKE ONE TABLET BY MOUTH DAILY  . hydrALAZINE (APRESOLINE) 25 MG tablet Take 50 mg by mouth daily.  . insulin glargine (LANTUS) 100 UNIT/ML Solostar Pen Inject 10 Units into the skin daily.  . insulin lispro (HUMALOG) 100 UNIT/ML KiwkPen Inject 4 Units into the skin 3 (three) times daily as needed (high blood sugar > 200).   Marland Kitchen KEPPRA 500 MG tablet Take 1 tablet (500 mg total) by mouth 2 (two) times daily.  . metoprolol tartrate (LOPRESSOR) 25 MG tablet Take 1 tablet (25 mg total) by mouth daily.  . nitroGLYCERIN (NITROSTAT) 0.4 MG SL tablet Place 1 tablet (0.4 mg total) under the tongue every 5 (five) minutes as needed for chest pain.  Glory Rosebush DELICA LANCETS 26Z MISC   . ONETOUCH VERIO test strip   . oxyCODONE-acetaminophen (PERCOCET/ROXICET) 5-325 MG tablet Take by mouth every 4 (four) hours as needed for severe pain.  Marland Kitchen RAPAFLO 8 MG CAPS capsule Take 8 mg by mouth daily with breakfast.   . rosuvastatin (CRESTOR) 20 MG tablet TAKE ONE TABLET BY MOUTH DAILY   No facility-administered encounter medications on file as of 01/08/2021.    Thank you for the opportunity to participate in the care of Mr. Trigo Winterbottom. The palliative care team will continue to follow. Please call our office at [phone] if we can be of additional assistance.  Jari Favre, DNP, AGPCNP-BC

## 2021-01-09 NOTE — Progress Notes (Signed)
[name], [name] ([id number]) . Visit Report for 01/03/2021 Arrival Information Details Patient Name: Date of Service: NECO, KLING [facility] [name]. 01/03/2021 2:30 PM Medical Record Number: [id number] Patient Account Number: [id number] Date of Birth/Sex: Treating RN: Jul 04, 1946 (75 y.o. [name], [name] Primary Care Riker Collier: [name] Other Clinician: Referring Asya Derryberry: Treating [name]/Extender: [name] in Treatment: 65 Visit Information History Since Last Visit Added or deleted any medications: No Patient Arrived: Wheel Chair Any new allergies or adverse reactions: No Arrival Time: 14:40 Had a fall or experienced change in No Accompanied By: wife activities of daily living that may affect Transfer Assistance: None risk of falls: Patient Identification Verified: Yes Signs or symptoms of abuse/neglect since last visito No Secondary Verification Process Completed: Yes Hospitalized since last visit: No Patient Requires Transmission-Based Precautions: No Implantable device outside of the clinic excluding No Patient Has Alerts: Yes cellular tissue based products placed in the center Patient Alerts: Patient on Blood Thinner since last visit: Has Dressing in Place as Prescribed: Yes Pain Present Now: No Electronic Signature([name]) Signed: 01/09/2021 9:05:34 AM By: Sandre Kitty Entered By: Sandre Kitty on 01/03/2021 14:40:22 -------------------------------------------------------------------------------- Clinic Level of Care Assessment Details Patient Name: Date of Service: MACCOY, [name] [facility] [name]. 01/03/2021 2:30 PM Medical Record Number: [id number] Patient Account Number: [id number] Date of Birth/Sex: Treating RN: 12/10/1945 (75 y.o. [name], [name] Primary Care [name]: [name] Other Clinician: Referring [name]: Treating [name]/Extender: [name] in Treatment: 22 Clinic Level of Care Assessment Items TOOL 4  Quantity Score X- 1 0 Use when only an EandM is performed on FOLLOW-UP visit ASSESSMENTS - Nursing Assessment / Reassessment X- 1 10 Reassessment of Co-morbidities (includes updates in patient status) X- 1 5 Reassessment of Adherence to Treatment Plan ASSESSMENTS - Wound and Skin A ssessment / Reassessment []  - 0 Simple Wound Assessment / Reassessment - one wound X- 4 5 Complex Wound Assessment / Reassessment - multiple wounds X- 1 10 Dermatologic / Skin Assessment (not related to wound area) ASSESSMENTS - Focused Assessment X- 2 5 Circumferential Edema Measurements - multi extremities X- 1 10 Nutritional Assessment / Counseling / Intervention []  - 0 Lower Extremity Assessment (monofilament, tuning fork, pulses) []  - 0 Peripheral Arterial Disease Assessment (using hand held doppler) ASSESSMENTS - Ostomy and/or Continence Assessment and Care []  - 0 Incontinence Assessment and Management []  - 0 Ostomy Care Assessment and Management (repouching, etc.) PROCESS - Coordination of Care []  - 0 Simple Patient / Family Education for ongoing care X- 1 20 Complex (extensive) Patient / Family Education for ongoing care X- 1 10 Staff obtains Consents, Records, [name] Results / Process Orders est X- 1 10 Staff telephones HHA, Nursing Homes / Clarify orders / etc []  - 0 Routine Transfer to another Facility (non-emergent condition) []  - 0 Routine Hospital Admission (non-emergent condition) []  - 0 New Admissions / Biomedical engineer / Ordering NPWT Apligraf, etc. , []  - 0 Emergency Hospital Admission (emergent condition) []  - 0 Simple Discharge Coordination X- 1 15 Complex (extensive) Discharge Coordination PROCESS - Special Needs []  - 0 Pediatric / Minor Patient Management []  - 0 Isolation Patient Management []  - 0 Hearing / Language / Visual special needs []  - 0 Assessment of Community assistance (transportation, D/C planning, etc.) []  - 0 Additional assistance /  Altered mentation []  - 0 Support Surface([name]) Assessment (bed, cushion, seat, etc.) INTERVENTIONS - Wound Cleansing / Measurement []  - 0 Simple Wound Cleansing - one wound X- 4 5 Complex Wound Cleansing -  multiple wounds X- 1 5 Wound Imaging (photographs - any number of wounds) $RemoveBe'[]'QdVwZderf$  - 0 Wound Tracing (instead of photographs) $RemoveBeforeD'[]'kXzAOXKgumOVng$  - 0 Simple Wound Measurement - one wound X- 4 5 Complex Wound Measurement - multiple wounds INTERVENTIONS - Wound Dressings X - Small Wound Dressing one or multiple wounds 4 10 $Re'[]'gip$  - 0 Medium Wound Dressing one or multiple wounds $RemoveBeforeD'[]'zCrNXLSgrporNg$  - 0 Large Wound Dressing one or multiple wounds $RemoveBeforeD'[]'JpXJvJSlIQajVH$  - 0 Application of Medications - topical $RemoveB'[]'EhLcLSlZ$  - 0 Application of Medications - injection INTERVENTIONS - Miscellaneous $RemoveBeforeD'[]'YMeYRVQoBxoSxm$  - 0 External ear exam $Remove'[]'Yllrmfn$  - 0 Specimen Collection (cultures, biopsies, blood, body fluids, etc.) $RemoveBefor'[]'WhgJUSJtHVTX$  - 0 Specimen([name]) / Culture([name]) sent or taken to Lab for analysis $RemoveBefo'[]'GpszdFBFowg$  - 0 Patient Transfer (multiple staff / Civil Service fast streamer / Similar devices) $RemoveBeforeDE'[]'SkgtgMhTkDwbctQ$  - 0 Simple Staple / Suture removal (25 or less) $Remove'[]'uQavHkB$  - 0 Complex Staple / Suture removal (26 or more) $Remove'[]'cuDrJVz$  - 0 Hypo / Hyperglycemic Management (close monitor of Blood Glucose) $RemoveBefore'[]'DYbHoDHEQgbuN$  - 0 Ankle / Brachial Index (ABI) - do not check if billed separately X- 1 5 Vital Signs Has the patient been seen at the hospital within the last three years: Yes Total Score: 210 Level Of Care: New/Established - Level 5 Electronic Signature([name]) Signed: 01/03/2021 4:43:39 PM By: Deon Pilling Entered By: Deon Pilling on 01/03/2021 15:18:05 -------------------------------------------------------------------------------- Encounter Discharge Information Details Patient Name: Date of Service: [name] [name]. 01/03/2021 2:30 PM Medical Record Number: [id number] Patient Account Number: [id number] Date of Birth/Sex: Treating RN: 01-Mar-1946 (75 y.o. [name], Lauren Primary Care Ambers Iyengar: [name] Other Clinician: Referring  Hayato Guaman: Treating Brayden Brodhead/Extender: [name] in Treatment: 63 Encounter Discharge Information Items Discharge Condition: Stable Ambulatory Status: Wheelchair Discharge Destination: Home Transportation: Private Auto Accompanied By: wife Schedule Follow-up Appointment: Yes Clinical Summary of Care: Patient Declined Electronic Signature([name]) Signed: 01/03/2021 5:01:28 PM By: Rhae Hammock RN Entered By: Rhae Hammock on 01/03/2021 16:15:37 -------------------------------------------------------------------------------- Lower Extremity Assessment Details Patient Name: Date of Service: [name] [name]. 01/03/2021 2:30 PM Medical Record Number: [id number] Patient Account Number: [id number] Date of Birth/Sex: Treating RN: 1945-10-19 (75 y.o. [name] Primary Care Jerritt Cardoza: [name] Other Clinician: Referring Angala Hilgers: Treating Zoria Rawlinson/Extender: [name] in Treatment: 22 Edema Assessment Assessed: Shirlyn Goltz: No] Patrice Paradise: No] Edema: [Left: N] [Right: o] Calf Left: Right: Point of Measurement: 27 cm From Medial Instep 23.5 cm 25 cm Ankle Left: Right: Point of Measurement: 9 cm From Medial Instep 18 cm 17 cm Vascular Assessment Pulses: Dorsalis Pedis Palpable: [Left:Yes] [Right:Yes] Electronic Signature([name]) Signed: 01/03/2021 4:43:39 PM By: Deon Pilling Entered By: Deon Pilling on 01/03/2021 14:58:58 -------------------------------------------------------------------------------- [place] Details Patient Name: Date of Service: [name] [name]. 01/03/2021 2:30 PM Medical Record Number: [id number] Patient Account Number: [id number] Date of Birth/Sex: Treating RN: 1946/08/30 (75 y.o. [name] Primary Care Thekla Colborn: [name] Other Clinician: Referring Ovetta Bazzano: Treating Priscilla Kirstein/Extender: [name] in Treatment: Parmer reviewed with physician Active Inactive Wound/Skin Impairment Nursing Diagnoses: Impaired tissue integrity Knowledge deficit related to ulceration/compromised skin integrity Goals: Patient/caregiver will verbalize understanding of skin care regimen Date Initiated: 07/27/2020 Target Resolution Date: 03/08/2021 Goal Status: Active Ulcer/skin breakdown will have a volume reduction of 30% by week 4 Date Initiated: 07/27/2020 Date Inactivated: 09/13/2020 Target Resolution Date: 08/24/2020 Goal Status: Met Interventions: Assess patient/caregiver ability to obtain necessary supplies Assess patient/caregiver ability to perform ulcer/skin care regimen upon admission and as needed Assess ulceration([name]) every visit  Provide education on ulcer and skin care Treatment Activities: Skin care regimen initiated : 07/27/2020 Topical wound management initiated : 07/27/2020 Notes: Electronic Signature([name]) Signed: 01/03/2021 4:43:39 PM By: Deon Pilling Entered By: Deon Pilling on 01/03/2021 14:34:25 -------------------------------------------------------------------------------- Pain Assessment Details Patient Name: Date of Service: [name]. 01/03/2021 2:30 PM Medical Record Number: [id number] Patient Account Number: [id number] Date of Birth/Sex: Treating RN: 01-22-1946 (75 y.o. [name] Primary Care Monique Hefty: [name] Other Clinician: Referring Tagan Bartram: Treating Sabrina Keough/Extender: [name] in Treatment: 22 Active Problems Location of Pain Severity and Description of Pain Patient Has Paino No Site Locations Pain Management and Medication Current Pain Management: Electronic Signature([name]) Signed: 01/03/2021 4:43:39 PM By: Deon Pilling Signed: 01/09/2021 9:05:34 AM By: Sandre Kitty Entered By: Sandre Kitty on 01/03/2021 14:40:47 -------------------------------------------------------------------------------- Patient/Caregiver  Education Details Patient Name: Date of Service: Julieta Gutting 3/24/2022andnbsp2:30 PM Medical Record Number: [id number] Patient Account Number: [id number] Date of Birth/Gender: Treating RN: [birth year] (75 y.o. [name] Primary Care Physician: [name] Other Clinician: Referring Physician: Treating Physician/Extender: [name] in Treatment: 43 Education Assessment Education Provided To: Patient Education Topics Provided Wound/Skin Impairment: Handouts: Skin Care Do'[name] and Dont'[name] Methods: Explain/Verbal Responses: Reinforcements needed Electronic Signature([name]) Signed: 01/03/2021 4:43:39 PM By: Deon Pilling Entered By: Deon Pilling on 01/03/2021 14:34:38 -------------------------------------------------------------------------------- Wound Assessment Details Patient Name: Date of Service: [name]. 01/03/2021 2:30 PM Medical Record Number: [id number] Patient Account Number: [id number] Date of Birth/Sex: Treating RN: 1946/05/12 (75 y.o. [name], [name] Primary Care Izel Hochberg: [name] Other Clinician: Referring [name] Seddon: Treating Devera Englander/Extender: [name] in Treatment: 22 Wound Status Wound Number: 2 Primary Diabetic Wound/Ulcer of the Lower Extremity Etiology: Wound Location: Left, Lateral [name] Fourth oe Wound Open Wounding Event: Gradually Appeared Status: Date Acquired: 08/16/2020 Comorbid Congestive Heart Failure, Coronary Artery Disease, Weeks Of Treatment: 20 History: Hypertension, Type II Diabetes, Seizure Disorder Clustered Wound: No Photos Wound Measurements Length: (cm) 0.5 Width: (cm) 0.3 Depth: (cm) 0.1 Area: (cm) 0.118 Volume: (cm) 0.012 % Reduction in Area: 6.3% % Reduction in Volume: 7.7% Epithelialization: Large (67-100%) Tunneling: No Undermining: No Wound Description Classification: Grade 2 Wound Margin: Flat and Intact Exudate Amount: None  Present Foul Odor After Cleansing: No Slough/Fibrino Yes Wound Bed Granulation Amount: None Present (0%) Exposed Structure Necrotic Amount: None Present (0%) Fascia Exposed: No Fat Layer (Subcutaneous Tissue) Exposed: No Tendon Exposed: No Muscle Exposed: No Joint Exposed: No Bone Exposed: No Treatment Notes Wound #2 (Toe Fourth) Wound Laterality: Left, Lateral Cleanser Wound Cleanser Discharge Instruction: Cleanse the wound with wound cleanser prior to applying a clean dressing using gauze sponges, not tissue or cotton balls. Peri-Wound Care Topical Primary Dressing Betadine Discharge Instruction: apply betadine to wound. Secondary Dressing Woven Gauze Sponges 2x2 in Discharge Instruction: Apply over primary dressing as directed. Secured With Conforming Stretch Gauze Bandage, Sterile 2x75 (in/in) Discharge Instruction: Secure with stretch gauze as directed. Compression Wrap Compression Stockings Add-Ons Electronic Signature([name]) Signed: 01/03/2021 5:03:12 PM By: Deon Pilling Signed: 01/09/2021 9:05:34 AM By: Sandre Kitty Previous Signature: 01/03/2021 4:43:39 PM Version By: Deon Pilling Entered By: Sandre Kitty on 01/03/2021 16:49:57 -------------------------------------------------------------------------------- Wound Assessment Details Patient Name: Date of Service: [name] [name]. 01/03/2021 2:30 PM Medical Record Number: [id number] Patient Account Number: [id number] Date of Birth/Sex: Treating RN: [birth year] (75 y.o. [name] Primary Care Lashann Hagg: [name] Other Clinician: Referring Jeremie Abdelaziz: Treating Kaylan Yates/Extender: [name] in Treatment: 22 Wound Status Wound  Number: 4 Primary Diabetic Wound/Ulcer of the Lower Extremity Etiology: Wound Location: Right [name] Great oe Wound Open Wounding Event: Not Known Status: Date Acquired: 10/11/2020 Comorbid Congestive Heart Failure, Coronary Artery Disease, Weeks  Of Treatment: 11 History: Hypertension, Type II Diabetes, Seizure Disorder Clustered Wound: No Photos Wound Measurements Length: (cm) 0.6 Width: (cm) 0.6 Depth: (cm) 0.1 Area: (cm) 0.283 Volume: (cm) 0.028 % Reduction in Area: 74.3% % Reduction in Volume: 74.5% Epithelialization: Medium (34-66%) Tunneling: No Undermining: No Wound Description Classification: Grade 2 Wound Margin: Flat and Intact Exudate Amount: None Present Foul Odor After Cleansing: No Slough/Fibrino Yes Wound Bed Granulation Amount: None Present (0%) Exposed Structure Necrotic Amount: Large (67-100%) Fascia Exposed: No Necrotic Quality: Eschar Fat Layer (Subcutaneous Tissue) Exposed: Yes Tendon Exposed: No Muscle Exposed: No Joint Exposed: No Bone Exposed: Yes Treatment Notes Wound #4 (Toe Great) Wound Laterality: Right Cleanser Wound Cleanser Discharge Instruction: Cleanse the wound with wound cleanser prior to applying a clean dressing using gauze sponges, not tissue or cotton balls. Peri-Wound Care Topical Primary Dressing Betadine Discharge Instruction: apply betadine to wound. Secondary Dressing Woven Gauze Sponges 2x2 in Discharge Instruction: Apply over primary dressing as directed. Secured With Conforming Stretch Gauze Bandage, Sterile 2x75 (in/in) Discharge Instruction: Secure with stretch gauze as directed. Compression Wrap Compression Stockings Add-Ons Electronic Signature([name]) Signed: 01/03/2021 5:03:12 PM By: Shawn Stall Signed: 01/09/2021 9:05:34 AM By: Karl Ito Previous Signature: 01/03/2021 4:43:39 PM Version By: Shawn Stall Entered By: Karl Ito on 01/03/2021 16:50:21 -------------------------------------------------------------------------------- Wound Assessment Details Patient Name: Date of Service: Particia Jasper [name] [name]. 01/03/2021 2:30 PM Medical Record Number: [id number] Patient Account Number: [id number] Date of Birth/Sex: Treating RN: [birth year] (75  y.o. Harlon Flor, Millard.Loa Primary Care Nichole Neyer: Mila Palmer Other Clinician: Referring Danice Dippolito: Treating Erinn Mendosa/Extender: Francina Ames in Treatment: 22 Wound Status Wound Number: 5 Primary Diabetic Wound/Ulcer of the Lower Extremity Etiology: Wound Location: Right [name] Fifth oe Wound Open Wounding Event: Not Known Status: Date Acquired: 10/11/2020 Comorbid Congestive Heart Failure, Coronary Artery Disease, Weeks Of Treatment: 11 History: Hypertension, Type II Diabetes, Seizure Disorder Clustered Wound: No Photos Wound Measurements Length: (cm) 1 Width: (cm) 1.1 Depth: (cm) 0.1 Area: (cm) 0.864 Volume: (cm) 0.086 % Reduction in Area: -119.8% % Reduction in Volume: -120.5% Epithelialization: None Tunneling: No Undermining: No Wound Description Classification: Grade 2 Wound Margin: Distinct, outline attached Exudate Amount: Small Exudate Type: Serosanguineous Exudate Color: red, brown Foul Odor After Cleansing: No Slough/Fibrino No Wound Bed Granulation Amount: None Present (0%) Exposed Structure Necrotic Amount: Large (67-100%) Fascia Exposed: No Necrotic Quality: Eschar Fat Layer (Subcutaneous Tissue) Exposed: Yes Tendon Exposed: No Muscle Exposed: No Joint Exposed: No Bone Exposed: No Treatment Notes Wound #5 (Toe Fifth) Wound Laterality: Right Cleanser Wound Cleanser Discharge Instruction: Cleanse the wound with wound cleanser prior to applying a clean dressing using gauze sponges, not tissue or cotton balls. Peri-Wound Care Topical Primary Dressing Betadine Discharge Instruction: apply betadine to wound. Secondary Dressing Woven Gauze Sponges 2x2 in Discharge Instruction: Apply over primary dressing as directed. Secured With Conforming Stretch Gauze Bandage, Sterile 2x75 (in/in) Discharge Instruction: Secure with stretch gauze as directed. Compression Wrap Compression Stockings Add-Ons Electronic Signature([name]) Signed:  01/03/2021 5:03:12 PM By: Shawn Stall Signed: 01/09/2021 9:05:34 AM By: Karl Ito Previous Signature: 01/03/2021 4:43:39 PM Version By: Shawn Stall Entered By: Karl Ito on 01/03/2021 16:53:18 -------------------------------------------------------------------------------- Wound Assessment Details Patient Name: Date of Service: Particia Jasper [name] [name]. 01/03/2021 2:30 PM Medical Record Number: [id number] Patient Account Number: [id number] Date  of Birth/Sex: Treating RN: 06-19-1946 (75 y.o. [name], [name] Primary Care Hayven Croy: [name] Other Clinician: Referring Ugochukwu Chichester: Treating Senica Crall/Extender: [name] in Treatment: 22 Wound Status Wound Number: 6 Primary Diabetic Wound/Ulcer of the Lower Extremity Etiology: Wound Location: Right [name] Fourth oe Wound Open Wounding Event: Gradually Appeared Status: Date Acquired: 11/22/2020 Comorbid Congestive Heart Failure, Coronary Artery Disease, Weeks Of Treatment: 6 History: Hypertension, Type II Diabetes, Seizure Disorder Clustered Wound: No Photos Wound Measurements Length: (cm) 0.4 Width: (cm) 1.1 Depth: (cm) 0.1 Area: (cm) 0.346 Volume: (cm) 0.035 % Reduction in Area: 26.5% % Reduction in Volume: 25.5% Epithelialization: None Tunneling: No Undermining: No Wound Description Classification: Grade 2 Wound Margin: Distinct, outline attached Exudate Amount: Medium Exudate Type: Serosanguineous Exudate Color: red, brown Foul Odor After Cleansing: No Slough/Fibrino No Wound Bed Granulation Amount: Small (1-33%) Exposed Structure Granulation Quality: Red Fascia Exposed: No Necrotic Amount: Large (67-100%) Fat Layer (Subcutaneous Tissue) Exposed: Yes Necrotic Quality: Eschar Tendon Exposed: No Muscle Exposed: No Joint Exposed: No Bone Exposed: No Treatment Notes Wound #6 (Toe Fourth) Wound Laterality: Right Cleanser Wound Cleanser Discharge Instruction: Cleanse the wound  with wound cleanser prior to applying a clean dressing using gauze sponges, not tissue or cotton balls. Peri-Wound Care Topical Primary Dressing Betadine Discharge Instruction: apply betadine to wound. Secondary Dressing Woven Gauze Sponges 2x2 in Discharge Instruction: Apply over primary dressing as directed. Secured With Conforming Stretch Gauze Bandage, Sterile 2x75 (in/in) Discharge Instruction: Secure with stretch gauze as directed. Compression Wrap Compression Stockings Add-Ons Electronic Signature([name]) Signed: 01/03/2021 5:03:12 PM By: Deon Pilling Signed: 01/09/2021 9:05:34 AM By: Sandre Kitty Previous Signature: 01/03/2021 4:43:39 PM Version By: Deon Pilling Entered By: Sandre Kitty on 01/03/2021 16:50:43 -------------------------------------------------------------------------------- Vitals Details Patient Name: Date of Service: [name] [name]. 01/03/2021 2:30 PM Medical Record Number: [id number] Patient Account Number: [id number] Date of Birth/Sex: Treating RN: 05-05-1946 (75 y.o. [name], [name] Primary Care Charlize Hathaway: [name] Other Clinician: Referring Alicia Seib: Treating Yandel Zeiner/Extender: [name] in Treatment: 22 Vital Signs Time Taken: 14:40 Temperature (F): 97.9 Height (in): 64 Pulse (bpm): 67 Weight (lbs): 150 Respiratory Rate (breaths/min): 15 Body Mass Index (BMI): 25.7 Blood Pressure (mmHg): 117/69 Reference Range: 80 - 120 mg / dl Electronic Signature([name]) Signed: 01/09/2021 9:05:34 AM By: Sandre Kitty Entered By: Sandre Kitty on 01/03/2021 14:40:41

## 2021-01-10 DIAGNOSIS — Z794 Long term (current) use of insulin: Secondary | ICD-10-CM | POA: Diagnosis not present

## 2021-01-10 DIAGNOSIS — S90411D Abrasion, right great toe, subsequent encounter: Secondary | ICD-10-CM | POA: Diagnosis not present

## 2021-01-10 DIAGNOSIS — E1122 Type 2 diabetes mellitus with diabetic chronic kidney disease: Secondary | ICD-10-CM | POA: Diagnosis not present

## 2021-01-10 DIAGNOSIS — Z466 Encounter for fitting and adjustment of urinary device: Secondary | ICD-10-CM | POA: Diagnosis not present

## 2021-01-10 DIAGNOSIS — G20 Parkinson's disease: Secondary | ICD-10-CM | POA: Diagnosis not present

## 2021-01-10 DIAGNOSIS — N1832 Chronic kidney disease, stage 3b: Secondary | ICD-10-CM | POA: Diagnosis not present

## 2021-01-10 DIAGNOSIS — I130 Hypertensive heart and chronic kidney disease with heart failure and stage 1 through stage 4 chronic kidney disease, or unspecified chronic kidney disease: Secondary | ICD-10-CM | POA: Diagnosis not present

## 2021-01-10 DIAGNOSIS — L89892 Pressure ulcer of other site, stage 2: Secondary | ICD-10-CM | POA: Diagnosis not present

## 2021-01-10 DIAGNOSIS — I5033 Acute on chronic diastolic (congestive) heart failure: Secondary | ICD-10-CM | POA: Diagnosis not present

## 2021-01-10 DIAGNOSIS — I69354 Hemiplegia and hemiparesis following cerebral infarction affecting left non-dominant side: Secondary | ICD-10-CM | POA: Diagnosis not present

## 2021-01-17 ENCOUNTER — Encounter (HOSPITAL_BASED_OUTPATIENT_CLINIC_OR_DEPARTMENT_OTHER): Payer: Medicare Other | Admitting: Internal Medicine

## 2021-01-17 DIAGNOSIS — Z794 Long term (current) use of insulin: Secondary | ICD-10-CM | POA: Diagnosis not present

## 2021-01-17 DIAGNOSIS — Z466 Encounter for fitting and adjustment of urinary device: Secondary | ICD-10-CM | POA: Diagnosis not present

## 2021-01-17 DIAGNOSIS — S90411D Abrasion, right great toe, subsequent encounter: Secondary | ICD-10-CM | POA: Diagnosis not present

## 2021-01-17 DIAGNOSIS — I69354 Hemiplegia and hemiparesis following cerebral infarction affecting left non-dominant side: Secondary | ICD-10-CM | POA: Diagnosis not present

## 2021-01-17 DIAGNOSIS — E1122 Type 2 diabetes mellitus with diabetic chronic kidney disease: Secondary | ICD-10-CM | POA: Diagnosis not present

## 2021-01-17 DIAGNOSIS — N1832 Chronic kidney disease, stage 3b: Secondary | ICD-10-CM | POA: Diagnosis not present

## 2021-01-17 DIAGNOSIS — I130 Hypertensive heart and chronic kidney disease with heart failure and stage 1 through stage 4 chronic kidney disease, or unspecified chronic kidney disease: Secondary | ICD-10-CM | POA: Diagnosis not present

## 2021-01-17 DIAGNOSIS — G20 Parkinson's disease: Secondary | ICD-10-CM | POA: Diagnosis not present

## 2021-01-17 DIAGNOSIS — I5033 Acute on chronic diastolic (congestive) heart failure: Secondary | ICD-10-CM | POA: Diagnosis not present

## 2021-01-17 DIAGNOSIS — L89892 Pressure ulcer of other site, stage 2: Secondary | ICD-10-CM | POA: Diagnosis not present

## 2021-01-23 DIAGNOSIS — S90411D Abrasion, right great toe, subsequent encounter: Secondary | ICD-10-CM | POA: Diagnosis not present

## 2021-01-23 DIAGNOSIS — L89892 Pressure ulcer of other site, stage 2: Secondary | ICD-10-CM | POA: Diagnosis not present

## 2021-01-23 DIAGNOSIS — G20 Parkinson's disease: Secondary | ICD-10-CM | POA: Diagnosis not present

## 2021-01-23 DIAGNOSIS — N1832 Chronic kidney disease, stage 3b: Secondary | ICD-10-CM | POA: Diagnosis not present

## 2021-01-23 DIAGNOSIS — E1122 Type 2 diabetes mellitus with diabetic chronic kidney disease: Secondary | ICD-10-CM | POA: Diagnosis not present

## 2021-01-23 DIAGNOSIS — I69354 Hemiplegia and hemiparesis following cerebral infarction affecting left non-dominant side: Secondary | ICD-10-CM | POA: Diagnosis not present

## 2021-01-23 DIAGNOSIS — Z466 Encounter for fitting and adjustment of urinary device: Secondary | ICD-10-CM | POA: Diagnosis not present

## 2021-01-23 DIAGNOSIS — I130 Hypertensive heart and chronic kidney disease with heart failure and stage 1 through stage 4 chronic kidney disease, or unspecified chronic kidney disease: Secondary | ICD-10-CM | POA: Diagnosis not present

## 2021-01-23 DIAGNOSIS — Z794 Long term (current) use of insulin: Secondary | ICD-10-CM | POA: Diagnosis not present

## 2021-01-23 DIAGNOSIS — I5033 Acute on chronic diastolic (congestive) heart failure: Secondary | ICD-10-CM | POA: Diagnosis not present

## 2021-01-26 DIAGNOSIS — R339 Retention of urine, unspecified: Secondary | ICD-10-CM | POA: Diagnosis not present

## 2021-01-30 DIAGNOSIS — I69354 Hemiplegia and hemiparesis following cerebral infarction affecting left non-dominant side: Secondary | ICD-10-CM | POA: Diagnosis not present

## 2021-01-30 DIAGNOSIS — Z951 Presence of aortocoronary bypass graft: Secondary | ICD-10-CM | POA: Diagnosis not present

## 2021-01-30 DIAGNOSIS — C7982 Secondary malignant neoplasm of genital organs: Secondary | ICD-10-CM | POA: Diagnosis not present

## 2021-01-30 DIAGNOSIS — L89153 Pressure ulcer of sacral region, stage 3: Secondary | ICD-10-CM | POA: Diagnosis not present

## 2021-01-30 DIAGNOSIS — L8990 Pressure ulcer of unspecified site, unspecified stage: Secondary | ICD-10-CM | POA: Diagnosis not present

## 2021-01-30 DIAGNOSIS — I639 Cerebral infarction, unspecified: Secondary | ICD-10-CM | POA: Diagnosis not present

## 2021-01-30 DIAGNOSIS — L89300 Pressure ulcer of unspecified buttock, unstageable: Secondary | ICD-10-CM | POA: Diagnosis not present

## 2021-01-30 DIAGNOSIS — B3749 Other urogenital candidiasis: Secondary | ICD-10-CM | POA: Diagnosis not present

## 2021-01-30 DIAGNOSIS — J9690 Respiratory failure, unspecified, unspecified whether with hypoxia or hypercapnia: Secondary | ICD-10-CM | POA: Diagnosis not present

## 2021-01-31 ENCOUNTER — Other Ambulatory Visit: Payer: Self-pay

## 2021-01-31 ENCOUNTER — Encounter (HOSPITAL_BASED_OUTPATIENT_CLINIC_OR_DEPARTMENT_OTHER): Payer: Medicare Other | Attending: Internal Medicine | Admitting: Internal Medicine

## 2021-01-31 DIAGNOSIS — E1122 Type 2 diabetes mellitus with diabetic chronic kidney disease: Secondary | ICD-10-CM | POA: Diagnosis not present

## 2021-01-31 DIAGNOSIS — L97528 Non-pressure chronic ulcer of other part of left foot with other specified severity: Secondary | ICD-10-CM | POA: Diagnosis not present

## 2021-01-31 DIAGNOSIS — L97514 Non-pressure chronic ulcer of other part of right foot with necrosis of bone: Secondary | ICD-10-CM | POA: Diagnosis not present

## 2021-01-31 DIAGNOSIS — Z87891 Personal history of nicotine dependence: Secondary | ICD-10-CM | POA: Diagnosis not present

## 2021-01-31 DIAGNOSIS — I69354 Hemiplegia and hemiparesis following cerebral infarction affecting left non-dominant side: Secondary | ICD-10-CM | POA: Insufficient documentation

## 2021-01-31 DIAGNOSIS — Z8551 Personal history of malignant neoplasm of bladder: Secondary | ICD-10-CM | POA: Insufficient documentation

## 2021-01-31 DIAGNOSIS — I5032 Chronic diastolic (congestive) heart failure: Secondary | ICD-10-CM | POA: Insufficient documentation

## 2021-01-31 DIAGNOSIS — E1151 Type 2 diabetes mellitus with diabetic peripheral angiopathy without gangrene: Secondary | ICD-10-CM | POA: Diagnosis not present

## 2021-01-31 DIAGNOSIS — G20 Parkinson's disease: Secondary | ICD-10-CM | POA: Insufficient documentation

## 2021-01-31 DIAGNOSIS — C61 Malignant neoplasm of prostate: Secondary | ICD-10-CM | POA: Insufficient documentation

## 2021-01-31 DIAGNOSIS — I130 Hypertensive heart and chronic kidney disease with heart failure and stage 1 through stage 4 chronic kidney disease, or unspecified chronic kidney disease: Secondary | ICD-10-CM | POA: Diagnosis not present

## 2021-01-31 DIAGNOSIS — E11621 Type 2 diabetes mellitus with foot ulcer: Secondary | ICD-10-CM | POA: Insufficient documentation

## 2021-01-31 DIAGNOSIS — C7951 Secondary malignant neoplasm of bone: Secondary | ICD-10-CM | POA: Insufficient documentation

## 2021-01-31 NOTE — Progress Notes (Signed)
[name], [name] ([id number]) . Visit Report for 01/31/2021 Chief Complaint Document Details Patient Name: Date of Service: [name], [name] [facility] [name]. 01/31/2021 2:30 PM Medical Record Number: [id number] Patient Account Number: [id number] Date of Birth/Sex: Treating RN: 01-07-1946 (75 y.o. [name] Primary Care Provider: [name] Other Clinician: Referring Provider: Treating Provider/Extender: Theodosia Quay in Treatment: 26 Information Obtained from: Patient Chief Complaint 07/27/2020; patient is here for review of an area on the left buttock medially in close proximity to the gluteal cleft Electronic Signature(s) Signed: 01/31/2021 5:15:22 PM By: Kalman Shan DO Entered By: Kalman Shan on 01/31/2021 17:10:18 -------------------------------------------------------------------------------- HPI Details Patient Name: Date of Service: [name] [facility] [name]. 01/31/2021 2:30 PM Medical Record Number: [id number] Patient Account Number: [id number] Date of Birth/Sex: Treating RN: [birth year] (75 y.o. [name] Primary Care Provider: [name] Other Clinician: Referring Provider: Treating Provider/Extender: Theodosia Quay in Treatment: 26 History of Present Illness HPI Description: ADMISSION 07/27/2020 This is a 75 year old man who is accompanied by his wife (ex). Nevertheless she is his active caregiver. His most complicated features are remote CVA that left him with a left hemiparesis. He also has widely metastatic prostate cancer to bone which I think is contributed to increasing frailty this year and he is no longer ambulatory. He required hospitalization from 9/7 through 9/14 with respiratory failure seizures. During this time it was noted that he had an open area in the left buttock in close proximity to the gluteal cleft. His wife tells me that he has a history of perirectal abscesses. And she describes this area as  starting as a painful swelling in late August certainly sounds like an abscess. When he is in the hospital he required bedside IandD's but did not go to the OR. Has CT scan of the area showed soft tissue thickening in the perianal ulcer. At that point it was concerning for a fistula connection however a discrete abscess was not seen. Notable that he was hypoalbuminemic in the hospital but actually came out better with an albumin of 3.1 on 9/14 his wife says he is eating well. They are currently treating this with Santyl ointment and a backing wet-to-dry dressing. By description of his wife this is done quite nicely and there is certainly less debris over the wound surface. They already have a hospital bed with a level 3 pressure relief surface. The patient lies on his back when he is in bed especially at night although his wife is counseled him not to do this. They are starting sliding board transfers into his wheelchair I think with physical therapy. He has a Foley catheter in place Past medical history includes remote CVA, metastatic prostate cancer to large areas of his thoracic and lumbar spine and pelvis., Bilateral carotid artery stenosis, Parkinson's disease, type 2 diabetes, diastolic heart failure, CHF, hypertension and a recently discovered possible pancreatic mass in the head of the pancreas 08/16/2020 patient I admitted to the clinic 2-1/2 weeks ago. He had an abscess site on his left buttock that required an IandD while he was in the hospital in September. He has been using Santyl backing wet-to-dry. He is cared for her aerobically at home by his wife 2 concerning areas on his feet. A stage I pressure injury on the right heel this does not have an open wound. He also has on the lateral aspect of the left fourth toe a dry circular eschared wound. 12/2; monthly follow-up. Is a very disabled man  who had an abscess in his left buttock and required an IandD. We have been using Santyl to this  area I changed to silver collagen last time he is here and this actually looks quite a bit better The last time he was here his wife showed me an area on the left lateral fourth toe I think a pressure injury with his fifth toe. Been using Santyl here as well but not making much improvement As well she shows me he has an area on his right buttock which is very tiny pinpoint area but under illumination still obviously an open area. I wonder if this is more of a shear injury and transferring and a pressure injury per se 12/16; this is a very disabled man who has a left buttock area that required an IandD apparently an abscess. Since he has come here that he developed a area on the right buttock. His wife says that this is draining pus. Finally he has an area on the lateral part of the left fourth toe. We have been using silver collagen on the buttock and Iodoflex on the fourth toe 1/6; this is a disabled man we have been working on a left buttock area that was initially an IandD abscess injury. We have got this down to a small area in fact it appears to be epithelializing towards the base of the wound. When he was here last time he had a small draining area on the right buttock. I thought this might be a small cyst culture of the drainage was negative. I do not think this was an abscess at all While he has been here he has been developing areas on the toes on his left foot. He initially had one on the left fourth toe and then last time the right first toe. He is a type II diabetic. With he has a history of coronary artery disease. He complains of a lot of pain in the right foot 1/13; we have been working on areas on his left buttock and then on the right buttock. Both of these are healed today albeit the area on the left has some depth with skin is gone into a divot. Nevertheless I think this is a reasonable outcome. He arrived a week ago with ischemic looking wounds on his right fifth, right first and  his left fourth toe. He went on to have arterial studies which we received today these showed an ABI that was noncompressible on both sides however the great toe on the right had a TBI of 0.14 with a pressure of only 17. Monophasic waveforms on all the tibial vessels and including the popliteal on the left he had monophasic waveforms again noncompressible vessels and no recorded toe pressure. Clearly these are ischemic wounds. Fortunately he is not complaining of a lot of pain The patient has a complicated medical situation which includes widely metastatic prostate cancer to bone for which she is on palliative care he also has Parkinson's disease. I have a recent note from his neurologist at which time he asked whether they wish to de-escalate the Parkinson's regimen and the answer was no. The patient has a follow-up appointment with Dr. Gwenlyn Found on 1/28. I believe he is the patient's cardiologist but this appointment was made because of the vascular studies I think predominantly through Dr. Kennon Holter office. 2/10; patient went back to see Dr. Gwenlyn Found. He did not feel given his comorbidities that he was a candidate for endovascular therapy. His wife seems to  agree with that. He has ischemic wounds on his right toes particularly the right fifth toe fourth toe and first toe. He is not in a lot of pain unless these are manipulated 12/20/2020 on evaluation today patient appears to be doing well with regard to his feet all things considered. There does not appear to be any obvious signs of infection here. Most of the wounds are actually for the most part eschar and dry. With that being said the patient is not actually a candidate for revascularization according to Dr. Gwenlyn Found who he has seen previous. Nonetheless I think that for the time being Betadine or something of that sort just keep things clean and dry will be a good way to go. 3/24; patient has 3 areas on the dorsal right toes first fourth and fifth that are  necrotic probably ischemic. He was not felt to be a candidate for revascularization therapy. He also has an area on the left fourth toe in a similar situation. His significant other is concerned about possibility of an abscess on his buttock. He was put on antibiotics last time. I must say that when he was here before he seemed to have recurrent small abscesses but the purulence in this culture is negative I wondered about an epidermoid cyst although this area currently is not in the same spot 4/21; patient presents for his 1 month follow-up. He has chronic necrotic right toes. The affected toes include the first fourth and fifth digits. The third toe wound has closed. He uses Betadine on this daily. He has no complaints today. He denies fever chills, erythema to the wound sites. Electronic Signature(s) Signed: 01/31/2021 5:15:22 PM By: Kalman Shan DO Entered By: Kalman Shan on 01/31/2021 17:11:56 -------------------------------------------------------------------------------- Physical Exam Details Patient Name: Date of Service: [name] HN [name]. 01/31/2021 2:30 PM Medical Record Number: [id number] Patient Account Number: [id number] Date of Birth/Sex: Treating RN: 01-19-1946 (75 y.o. [name] Primary Care Provider: [name] Other Clinician: Referring Provider: Treating Provider/Extender: Theodosia Quay in Treatment: 26 Constitutional respirations regular, non-labored and within target range for patient.. Notes Right foot: He has eschar to the dorsal first fourth and fifth toes. There are no signs of infection. Electronic Signature(s) Signed: 01/31/2021 5:15:22 PM By: Kalman Shan DO Entered By: Kalman Shan on 01/31/2021 17:12:39 -------------------------------------------------------------------------------- Physician Orders Details Patient Name: Date of Service: [name] HN [name]. 01/31/2021 2:30 PM Medical Record Number:  [id number] Patient Account Number: [id number] Date of Birth/Sex: Treating RN: 01/05/1946 (75 y.o. [name], [name] Primary Care Provider: [name] Other Clinician: Referring Provider: Treating Provider/Extender: Theodosia Quay in Treatment: 26 Verbal / Phone Orders: No Diagnosis Coding ICD-10 Coding Code Description L97.528 Non-pressure chronic ulcer of other part of left foot with other specified severity L97.514 Non-pressure chronic ulcer of other part of right foot with necrosis of bone E11.51 Type 2 diabetes mellitus with diabetic peripheral angiopathy without gangrene Follow-up Appointments Return appointment in 1 month. Off-Loading Low air-loss mattress (Group 2) - CONTINUE TO USE. Turn and reposition every 2 hours Other: - CONTINUE TO USE BUNNY BOOTS FOR HEEL PROTECTION. UP IN WHEELCHAIR NO MORE THAT 2 HOUR INCREMENTS. Ensure to relieve pressure off buttock closed areas to prevent reopening. Home Health No change in wound care orders this week; continue Home Health for wound care. May utilize formulary equivalent dressing for wound treatment orders unless otherwise specified. - [facility] weekly. Wound Treatment Wound #4 - [name] Great oe Wound Laterality: Right  Cleanser: Wound Cleanser (Home Health) 1 x Per Day/30 Days Discharge Instructions: Cleanse the wound with wound cleanser prior to applying a clean dressing using gauze sponges, not tissue or cotton balls. Prim Dressing: Betadine 1 x Per Day/30 Days ary Discharge Instructions: apply betadine to wound. Secondary Dressing: Woven Gauze Sponges 2x2 in (Home Health) 1 x Per Day/30 Days Discharge Instructions: Apply over primary dressing as directed. Secured With: Child psychotherapist, Sterile 2x75 (in/in) (Home Health) 1 x Per Day/30 Days Discharge Instructions: Secure with stretch gauze as directed. Wound #5 - [name] Fifth oe Wound Laterality: Right Cleanser: Wound Cleanser  (Home Health) 1 x Per Day/30 Days Discharge Instructions: Cleanse the wound with wound cleanser prior to applying a clean dressing using gauze sponges, not tissue or cotton balls. Prim Dressing: Betadine 1 x Per Day/30 Days ary Discharge Instructions: apply betadine to wound. Secondary Dressing: Woven Gauze Sponges 2x2 in (Home Health) 1 x Per Day/30 Days Discharge Instructions: Apply over primary dressing as directed. Secured With: Child psychotherapist, Sterile 2x75 (in/in) (Home Health) 1 x Per Day/30 Days Discharge Instructions: Secure with stretch gauze as directed. Wound #6 - [name] Fourth oe Wound Laterality: Right Cleanser: Wound Cleanser (Home Health) 1 x Per Day/30 Days Discharge Instructions: Cleanse the wound with wound cleanser prior to applying a clean dressing using gauze sponges, not tissue or cotton balls. Prim Dressing: Betadine 1 x Per Day/30 Days ary Discharge Instructions: apply betadine to wound. Secondary Dressing: Woven Gauze Sponges 2x2 in (Home Health) 1 x Per Day/30 Days Discharge Instructions: Apply over primary dressing as directed. Secured With: Child psychotherapist, Sterile 2x75 (in/in) (Home Health) 1 x Per Day/30 Days Discharge Instructions: Secure with stretch gauze as directed. Electronic Signature(s) Signed: 01/31/2021 5:15:22 PM By: Kalman Shan DO Entered By: Kalman Shan on 01/31/2021 17:13:03 -------------------------------------------------------------------------------- Problem List Details Patient Name: Date of Service: [name] HN [name]. 01/31/2021 2:30 PM Medical Record Number: [id number] Patient Account Number: [id number] Date of Birth/Sex: Treating RN: [birth year] (76 y.o. [name] Primary Care Provider: [name] Other Clinician: Referring Provider: Treating Provider/Extender: Theodosia Quay in Treatment: 26 Active Problems ICD-10 Encounter Code Description Active Date  MDM Diagnosis L97.528 Non-pressure chronic ulcer of other part of left foot with other specified 08/16/2020 No Yes severity L97.514 Non-pressure chronic ulcer of other part of right foot with necrosis of bone 10/18/2020 No Yes E11.51 Type 2 diabetes mellitus with diabetic peripheral angiopathy without gangrene 10/18/2020 No Yes Inactive Problems ICD-10 Code Description Active Date Inactive Date L89.611 Pressure ulcer of right heel, stage 1 08/16/2020 08/16/2020 L89.312 Pressure ulcer of right buttock, stage 2 09/13/2020 09/13/2020 Resolved Problems ICD-10 Code Description Active Date Resolved Date L02.31 Cutaneous abscess of buttock 07/27/2020 07/27/2020 S31.829D Unspecified open wound of left buttock, subsequent encounter 07/27/2020 07/27/2020 Electronic Signature(s) Signed: 01/31/2021 5:15:22 PM By: Kalman Shan DO Entered By: Kalman Shan on 01/31/2021 17:09:46 -------------------------------------------------------------------------------- Progress Note Details Patient Name: Date of Service: [name] HN [name]. 01/31/2021 2:30 PM Medical Record Number: [id number] Patient Account Number: [id number] Date of Birth/Sex: Treating RN: 10-11-1946 (75 y.o. [name] Primary Care Provider: [name] Other Clinician: Referring Provider: Treating Provider/Extender: Theodosia Quay in Treatment: 26 Subjective Chief Complaint Information obtained from Patient 07/27/2020; patient is here for review of an area on the left buttock medially in close proximity to the gluteal cleft History of Present Illness (HPI) ADMISSION 07/27/2020 This is a 75 year old man who is accompanied by his  wife (ex). Nevertheless she is his active caregiver. His most complicated features are remote CVA that left him with a left hemiparesis. He also has widely metastatic prostate cancer to bone which I think is contributed to increasing frailty this year and he is no longer  ambulatory. He required hospitalization from 9/7 through 9/14 with respiratory failure seizures. During this time it was noted that he had an open area in the left buttock in close proximity to the gluteal cleft. His wife tells me that he has a history of perirectal abscesses. And she describes this area as starting as a painful swelling in late August certainly sounds like an abscess. When he is in the hospital he required bedside IandD's but did not go to the OR. Has CT scan of the area showed soft tissue thickening in the perianal ulcer. At that point it was concerning for a fistula connection however a discrete abscess was not seen. Notable that he was hypoalbuminemic in the hospital but actually came out better with an albumin of 3.1 on 9/14 his wife says he is eating well. They are currently treating this with Santyl ointment and a backing wet-to-dry dressing. By description of his wife this is done quite nicely and there is certainly less debris over the wound surface. They already have a hospital bed with a level 3 pressure relief surface. The patient lies on his back when he is in bed especially at night although his wife is counseled him not to do this. They are starting sliding board transfers into his wheelchair I think with physical therapy. He has a Foley catheter in place Past medical history includes remote CVA, metastatic prostate cancer to large areas of his thoracic and lumbar spine and pelvis., Bilateral carotid artery stenosis, Parkinson's disease, type 2 diabetes, diastolic heart failure, CHF, hypertension and a recently discovered possible pancreatic mass in the head of the pancreas 08/16/2020 patient I admitted to the clinic 2-1/2 weeks ago. He had an abscess site on his left buttock that required an IandD while he was in the hospital in September. He has been using Santyl backing wet-to-dry. He is cared for her aerobically at home by his wife 2 concerning areas on his feet. A  stage I pressure injury on the right heel this does not have an open wound. He also has on the lateral aspect of the left fourth toe a dry circular eschared wound. 12/2; monthly follow-up. Is a very disabled man who had an abscess in his left buttock and required an IandD. We have been using Santyl to this area I changed to silver collagen last time he is here and this actually looks quite a bit better ooThe last time he was here his wife showed me an area on the left lateral fourth toe I think a pressure injury with his fifth toe. Been using Santyl here as well but not making much improvement ooAs well she shows me he has an area on his right buttock which is very tiny pinpoint area but under illumination still obviously an open area. I wonder if this is more of a shear injury and transferring and a pressure injury per se 12/16; this is a very disabled man who has a left buttock area that required an IandD apparently an abscess. Since he has come here that he developed a area on the right buttock. His wife says that this is draining pus. Finally he has an area on the lateral part of the left fourth toe.  We have been using silver collagen on the buttock and Iodoflex on the fourth toe 1/6; this is a disabled man we have been working on a left buttock area that was initially an IandD abscess injury. We have got this down to a small area in fact it appears to be epithelializing towards the base of the wound. When he was here last time he had a small draining area on the right buttock. I thought this might be a small cyst culture of the drainage was negative. I do not think this was an abscess at all While he has been here he has been developing areas on the toes on his left foot. He initially had one on the left fourth toe and then last time the right first toe. He is a type II diabetic. With he has a history of coronary artery disease. He complains of a lot of pain in the right foot 1/13; we have  been working on areas on his left buttock and then on the right buttock. Both of these are healed today albeit the area on the left has some depth with skin is gone into a divot. Nevertheless I think this is a reasonable outcome. He arrived a week ago with ischemic looking wounds on his right fifth, right first and his left fourth toe. He went on to have arterial studies which we received today these showed an ABI that was noncompressible on both sides however the great toe on the right had a TBI of 0.14 with a pressure of only 17. Monophasic waveforms on all the tibial vessels and including the popliteal on the left he had monophasic waveforms again noncompressible vessels and no recorded toe pressure. Clearly these are ischemic wounds. Fortunately he is not complaining of a lot of pain The patient has a complicated medical situation which includes widely metastatic prostate cancer to bone for which she is on palliative care he also has Parkinson's disease. I have a recent note from his neurologist at which time he asked whether they wish to de-escalate the Parkinson's regimen and the answer was no. The patient has a follow-up appointment with Dr. Gwenlyn Found on 1/28. I believe he is the patient's cardiologist but this appointment was made because of the vascular studies I think predominantly through Dr. Kennon Holter office. 2/10; patient went back to see Dr. Gwenlyn Found. He did not feel given his comorbidities that he was a candidate for endovascular therapy. His wife seems to agree with that. He has ischemic wounds on his right toes particularly the right fifth toe fourth toe and first toe. He is not in a lot of pain unless these are manipulated 12/20/2020 on evaluation today patient appears to be doing well with regard to his feet all things considered. There does not appear to be any obvious signs of infection here. Most of the wounds are actually for the most part eschar and dry. With that being said the patient  is not actually a candidate for revascularization according to Dr. Gwenlyn Found who he has seen previous. Nonetheless I think that for the time being Betadine or something of that sort just keep things clean and dry will be a good way to go. 3/24; patient has 3 areas on the dorsal right toes first fourth and fifth that are necrotic probably ischemic. He was not felt to be a candidate for revascularization therapy. He also has an area on the left fourth toe in a similar situation. His significant other is concerned about possibility of  an abscess on his buttock. He was put on antibiotics last time. I must say that when he was here before he seemed to have recurrent small abscesses but the purulence in this culture is negative I wondered about an epidermoid cyst although this area currently is not in the same spot 4/21; patient presents for his 1 month follow-up. He has chronic necrotic right toes. The affected toes include the first fourth and fifth digits. The third toe wound has closed. He uses Betadine on this daily. He has no complaints today. He denies fever chills, erythema to the wound sites. Patient History Information obtained from Patient. Family History Unknown History. Social History Former smoker - quit 12 years ago, Marital Status - Divorced, Alcohol Use - Never, Drug Use - No History, Caffeine Use - Rarely. Medical History Cardiovascular Patient has history of Congestive Heart Failure, Coronary Artery Disease, Hypertension Endocrine Patient has history of Type II Diabetes Denies history of Type I Diabetes Neurologic Patient has history of Seizure Disorder Oncologic Denies history of Received Chemotherapy, Received Radiation Medical A Surgical History Notes nd Cardiovascular CVA, Carotid stenosis Genitourinary CKD stage 3 Neurologic Parkinson's, left hemiparesis, CVA Oncologic prostate cancer with mets to bone, bladder cancer Objective Constitutional respirations regular,  non-labored and within target range for patient.. Vitals Time Taken: 2:34 PM, Height: 64 in, Weight: 150 lbs, BMI: 25.7, Temperature: 97.9 F, Pulse: 60 bpm, Respiratory Rate: 18 breaths/min, Blood Pressure: 134/67 mmHg, Capillary Blood Glucose: 124 mg/dl. General Notes: glucose per pt report General Notes: Right foot: He has eschar to the dorsal first fourth and fifth toes. There are no signs of infection. Integumentary (Hair, Skin) Wound #2 status is Healed - Epithelialized. Original cause of wound was Gradually Appeared. The date acquired was: 08/16/2020. The wound has been in treatment 24 weeks. The wound is located on the Left,Lateral [name] Fourth. The wound measures 0cm length x 0cm width x 0cm depth; 0cm^2 area and 0cm^3 oe volume. There is no tunneling or undermining noted. There is a none present amount of drainage noted. The wound margin is flat and intact. There is no granulation within the wound bed. There is no necrotic tissue within the wound bed. Wound #4 status is Open. Original cause of wound was Not Known. The date acquired was: 10/11/2020. The wound has been in treatment 15 weeks. The wound is located on the Right [name] Great. The wound measures 1.2cm length x 1cm width x 0.1cm depth; 0.942cm^2 area and 0.094cm^3 volume. There is bone and oe Fat Layer (Subcutaneous Tissue) exposed. There is no tunneling or undermining noted. There is a none present amount of drainage noted. The wound margin is flat and intact. There is no granulation within the wound bed. There is a large (67-100%) amount of necrotic tissue within the wound bed including Eschar. Wound #5 status is Open. Original cause of wound was Not Known. The date acquired was: 10/11/2020. The wound has been in treatment 15 weeks. The wound is located on the Right [name] [address]. The wound measures 1.2cm length x 1cm width x 0.1cm depth; 0.942cm^2 area and 0.094cm^3 volume. There is Fat Layer oe (Subcutaneous Tissue) exposed. There is no  tunneling or undermining noted. There is a small amount of serosanguineous drainage noted. The wound margin is distinct with the outline attached to the wound base. There is no granulation within the wound bed. There is a large (67-100%) amount of necrotic tissue within the wound bed including Eschar. Wound #6 status is Open. Original cause  of wound was Gradually Appeared. The date acquired was: 11/22/2020. The wound has been in treatment 10 weeks. The wound is located on the Right [name] [address]. The wound measures 0.5cm length x 1.2cm width x 0.1cm depth; 0.471cm^2 area and 0.047cm^3 volume. There oe is Fat Layer (Subcutaneous Tissue) exposed. There is no tunneling or undermining noted. There is a small amount of serosanguineous drainage noted. The wound margin is distinct with the outline attached to the wound base. There is no granulation within the wound bed. There is a large (67-100%) amount of necrotic tissue within the wound bed including Eschar. 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 These are stable. These are ischemic due to poor arterial blood flow. He is not a candidate for revascularization. We will continue with Betadine daily. We discussed keeping a close eye on these to assure they do not become infected. We will see him in a month Plan Follow-up Appointments: Return appointment in 1 month. Off-Loading: Low air-loss mattress (Group 2) - CONTINUE TO USE. Turn and reposition every 2 hours Other: - CONTINUE TO USE BUNNY BOOTS FOR HEEL PROTECTION. UP IN WHEELCHAIR NO MORE THAT 2 HOUR INCREMENTS. Ensure to relieve pressure off buttock closed areas to prevent reopening. Home Health: No change in wound  care orders this week; continue Home Health for wound care. May utilize formulary equivalent dressing for wound treatment orders unless otherwise specified. - [facility] weekly. WOUND #4: - [name] Great Wound Laterality: Right oe Cleanser: Wound Cleanser (Home Health) 1 x Per Day/30 Days Discharge Instructions: Cleanse the wound with wound cleanser prior to applying a clean dressing using gauze sponges, not tissue or cotton balls. Prim Dressing: Betadine 1 x Per Day/30 Days ary Discharge Instructions: apply betadine to wound. Secondary Dressing: Woven Gauze Sponges 2x2 in (Home Health) 1 x Per Day/30 Days Discharge Instructions: Apply over primary dressing as directed. Secured With: Child psychotherapist, Sterile 2x75 (in/in) (Home Health) 1 x Per Day/30 Days Discharge Instructions: Secure with stretch gauze as directed. WOUND #5: - [name] Fifth Wound Laterality: Right oe Cleanser: Wound Cleanser (Home Health) 1 x Per Day/30 Days Discharge Instructions: Cleanse the wound with wound cleanser prior to applying a clean dressing using gauze sponges, not tissue or cotton balls. Prim Dressing: Betadine 1 x Per Day/30 Days ary Discharge Instructions: apply betadine to wound. Secondary Dressing: Woven Gauze Sponges 2x2 in (Home Health) 1 x Per Day/30 Days Discharge Instructions: Apply over primary dressing as directed. Secured With: Child psychotherapist, Sterile 2x75 (in/in) (Home Health) 1 x Per Day/30 Days Discharge Instructions: Secure with stretch gauze as directed. WOUND #6: - [name] Fourth Wound Laterality: Right oe Cleanser: Wound Cleanser (Home Health) 1 x Per Day/30 Days Discharge Instructions: Cleanse the wound with wound cleanser prior to applying a clean dressing using gauze sponges, not tissue or cotton balls. Prim Dressing: Betadine 1 x Per Day/30 Days ary Discharge Instructions: apply betadine to wound. Secondary Dressing: Woven Gauze Sponges 2x2 in (Home  Health) 1 x Per Day/30 Days Discharge Instructions: Apply over primary dressing as directed. Secured With: Child psychotherapist, Sterile 2x75 (in/in) (Home Health) 1 x Per Day/30 Days Discharge Instructions: Secure with stretch gauze as directed. 1. Betadine daily 2. Follow-up in  1 month Electronic Signature(s) Signed: 01/31/2021 5:15:22 PM By: Kalman Shan DO Entered By: Kalman Shan on 01/31/2021 17:14:36 -------------------------------------------------------------------------------- HxROS Details Patient Name: Date of Service: [name] HN [name]. 01/31/2021 2:30 PM Medical Record Number: [id number] Patient Account Number: [id number] Date of Birth/Sex: Treating RN: 29-Jan-1946 (75 y.o. [name] Primary Care Provider: [name] Other Clinician: Referring Provider: Treating Provider/Extender: Theodosia Quay in Treatment: 26 Information Obtained From Patient Cardiovascular Medical History: Positive for: Congestive Heart Failure; Coronary Artery Disease; Hypertension Past Medical History Notes: CVA, Carotid stenosis Endocrine Medical History: Positive for: Type II Diabetes Negative for: Type I Diabetes Time with diabetes: 5 years ago Treated with: Insulin Blood sugar tested every day: Yes Tested : once a day Genitourinary Medical History: Past Medical History Notes: CKD stage 3 Neurologic Medical History: Positive for: Seizure Disorder Past Medical History Notes: Parkinson's, left hemiparesis, CVA Oncologic Medical History: Negative for: Received Chemotherapy; Received Radiation Past Medical History Notes: prostate cancer with mets to bone, bladder cancer Immunizations Pneumococcal Vaccine: Received Pneumococcal Vaccination: Yes Implantable Devices None Family and Social History Unknown History: Yes; Former smoker - quit 12 years ago; Marital Status - Divorced; Alcohol Use: Never; Drug Use: No History;  Caffeine Use: Rarely; Financial Concerns: No; Food, Clothing or Shelter Needs: No; Support System Lacking: No; Transportation Concerns: No Electronic Signature(s) Signed: 01/31/2021 5:15:22 PM By: Kalman Shan DO Signed: 01/31/2021 5:27:07 PM By: Deon Pilling Entered By: Kalman Shan on 01/31/2021 17:12:03 -------------------------------------------------------------------------------- SuperBill Details Patient Name: Date of Service: Luis Patten [name]. 01/31/2021 Medical Record Number: [id number] Patient Account Number: [id number] Date of Birth/Sex: Treating RN: 10-03-1946 (75 y.o. [name], [name] Primary Care Provider: [name] Other Clinician: Referring Provider: Treating Provider/Extender: Theodosia Quay in Treatment: 26 Diagnosis Coding ICD-10 Codes Code Description [phone] Non-pressure chronic ulcer of other part of left foot with other specified severity L97.514 Non-pressure chronic ulcer of other part of right foot with necrosis of bone E11.51 Type 2 diabetes mellitus with diabetic peripheral angiopathy without gangrene Facility Procedures CPT4 Code: [id number] Description: 215 - WOUND CARE VISIT-LEV 5 EST PT 1 Modifier: Quantity: Electronic Signature(s) Signed: 01/31/2021 5:15:22 PM By: Kalman Shan DO Entered By: Kalman Shan on 01/31/2021 17:14:44

## 2021-02-01 DIAGNOSIS — S90411D Abrasion, right great toe, subsequent encounter: Secondary | ICD-10-CM | POA: Diagnosis not present

## 2021-02-01 DIAGNOSIS — I5033 Acute on chronic diastolic (congestive) heart failure: Secondary | ICD-10-CM | POA: Diagnosis not present

## 2021-02-01 DIAGNOSIS — G20 Parkinson's disease: Secondary | ICD-10-CM | POA: Diagnosis not present

## 2021-02-01 DIAGNOSIS — N1832 Chronic kidney disease, stage 3b: Secondary | ICD-10-CM | POA: Diagnosis not present

## 2021-02-01 DIAGNOSIS — I69354 Hemiplegia and hemiparesis following cerebral infarction affecting left non-dominant side: Secondary | ICD-10-CM | POA: Diagnosis not present

## 2021-02-01 DIAGNOSIS — Z466 Encounter for fitting and adjustment of urinary device: Secondary | ICD-10-CM | POA: Diagnosis not present

## 2021-02-01 DIAGNOSIS — I130 Hypertensive heart and chronic kidney disease with heart failure and stage 1 through stage 4 chronic kidney disease, or unspecified chronic kidney disease: Secondary | ICD-10-CM | POA: Diagnosis not present

## 2021-02-01 DIAGNOSIS — L89892 Pressure ulcer of other site, stage 2: Secondary | ICD-10-CM | POA: Diagnosis not present

## 2021-02-01 DIAGNOSIS — E1122 Type 2 diabetes mellitus with diabetic chronic kidney disease: Secondary | ICD-10-CM | POA: Diagnosis not present

## 2021-02-01 DIAGNOSIS — Z794 Long term (current) use of insulin: Secondary | ICD-10-CM | POA: Diagnosis not present

## 2021-02-05 ENCOUNTER — Ambulatory Visit: Payer: Medicare Other | Admitting: Oncology

## 2021-02-05 DIAGNOSIS — L89892 Pressure ulcer of other site, stage 2: Secondary | ICD-10-CM | POA: Diagnosis not present

## 2021-02-05 NOTE — Progress Notes (Signed)
ULIS, KAPS T ([id number]) . Visit Report for 01/31/2021 Arrival Information Details Patient Name: Date of Service: [name], [name] [facility] T. 01/31/2021 2:30 PM Medical Record Number: [id number] Patient Account Number: [id number] Date of Birth/Sex: Treating RN: [birth year] (75 y.o. [name] Primary Care Fusaye Wachtel: [name] Other Clinician: Referring Tallie Hevia: Treating Raymonde Hamblin/Extender: Theodosia Quay in Treatment: 11 Visit Information History Since Last Visit Added or deleted any medications: No Patient Arrived: Wheel Chair Any new allergies or adverse reactions: No Arrival Time: 14:34 Had a fall or experienced change in No Accompanied By: ex-wife activities of daily living that may affect Transfer Assistance: None risk of falls: Patient Identification Verified: Yes Signs or symptoms of abuse/neglect since last visito No Secondary Verification Process Completed: Yes Hospitalized since last visit: No Patient Requires Transmission-Based Precautions: No Implantable device outside of the clinic excluding No Patient Has Alerts: Yes cellular tissue based products placed in the center Patient Alerts: Patient on Blood Thinner since last visit: Has Dressing in Place as Prescribed: Yes Has Compression in Place as Prescribed: Yes Pain Present Now: Yes Electronic Signature(s) Signed: 01/31/2021 5:27:18 PM By: Levan Hurst RN, BSN Entered By: Levan Hurst on 01/31/2021 14:34:29 -------------------------------------------------------------------------------- Clinic Level of Care Assessment Details Patient Name: Date of Service: Luis Pence HN T. 01/31/2021 2:30 PM Medical Record Number: [id number] Patient Account Number: [id number] Date of Birth/Sex: Treating RN: 1946/07/10 (75 y.o. [name], [name] Primary Care Rosenda Geffrard: [name] Other Clinician: Referring Valerye Kobus: Treating Othel Dicostanzo/Extender: Theodosia Quay in  Treatment: 26 Clinic Level of Care Assessment Items TOOL 4 Quantity Score X- 1 0 Use when only an EandM is performed on FOLLOW-UP visit ASSESSMENTS - Nursing Assessment / Reassessment X- 1 10 Reassessment of Co-morbidities (includes updates in patient status) X- 1 5 Reassessment of Adherence to Treatment Plan ASSESSMENTS - Wound and Skin A ssessment / Reassessment _0  - 0 Simple Wound Assessment / Reassessment - one wound X- 4 5 Complex Wound Assessment / Reassessment - multiple wounds X- 1 10 Dermatologic / Skin Assessment (not related to wound area) ASSESSMENTS - Focused Assessment _1  - 0 Circumferential Edema Measurements - multi extremities X- 1 10 Nutritional Assessment / Counseling / Intervention _2  - 0 Lower Extremity Assessment (monofilament, tuning fork, pulses) _3  - 0 Peripheral Arterial Disease Assessment (using hand held doppler) ASSESSMENTS - Ostomy and/or Continence Assessment and Care _4  - 0 Incontinence Assessment and Management _5  - 0 Ostomy Care Assessment and Management (repouching, etc.) PROCESS - Coordination of Care _6  - 0 Simple Patient / Family Education for ongoing care X- 1 20 Complex (extensive) Patient / Family Education for ongoing care X- 1 10 Staff obtains Programmer, systems, Records, T Results / Process Orders est X- 1 10 Staff telephones HHA, Nursing Homes / Clarify orders / etc _7  - 0 Routine Transfer to another Facility (non-emergent condition) _8  - 0 Routine Hospital Admission (non-emergent condition) _9  - 0 New Admissions / Biomedical engineer / Ordering NPWT Apligraf, etc. , _10  - 0 Emergency Hospital Admission (emergent condition) _11  - 0 Simple Discharge Coordination X- 1 15 Complex (extensive) Discharge Coordination PROCESS - Special Needs _12  - 0 Pediatric / Minor Patient Management _13  - 0 Isolation Patient Management _14  - 0 Hearing / Language / Visual special needs _15  - 0 Assessment of Community assistance  (transportation, D/C planning, etc.) _16  - 0 Additional assistance / Altered mentation _17  - 0 Support Surface(s) Assessment (bed, cushion, seat, etc.) INTERVENTIONS - Wound Cleansing / Measurement _18  - 0 Simple Wound Cleansing -  one wound X- 4 5 Complex Wound Cleansing - multiple wounds X- 1 5 Wound Imaging (photographs - any number of wounds) _0  - 0 Wound Tracing (instead of photographs) _1  - 0 Simple Wound Measurement - one wound X- 4 5 Complex Wound Measurement - multiple wounds INTERVENTIONS - Wound Dressings X - Small Wound Dressing one or multiple wounds 3 10 _2  - 0 Medium Wound Dressing one or multiple wounds _3  - 0 Large Wound Dressing one or multiple wounds <HYQMVHQIONGEXBMW>_4<\/[id number]>_4  - 0 Application of Medications - topical <IHKVQQVZDGLOVFIE>_3<\/PIRJJOACZYSAYTKZ>_6  - 0 Application of Medications - injection INTERVENTIONS - Miscellaneous _6  - 0 External ear exam _7  - 0 Specimen Collection (cultures, biopsies, blood, body fluids, etc.) _8  - 0 Specimen(s) / Culture(s) sent or taken to Lab for analysis _9  - 0 Patient Transfer (multiple staff / Civil Service fast streamer / Similar devices) _10  - 0 Simple Staple / Suture removal (25 or less) _11  - 0 Complex Staple / Suture removal (26 or more) _12  - 0 Hypo / Hyperglycemic Management (close monitor of Blood Glucose) _13  - 0 Ankle / Brachial Index (ABI) - do not check if billed separately X- 1 5 Vital Signs Has the patient been seen at the hospital within the last three years: Yes Total Score: 190 Level Of Care: New/Established - Level 5 Electronic Signature(s) Signed: 01/31/2021 5:27:07 PM By: Deon Pilling Entered By: Deon Pilling on 01/31/2021 15:21:35 -------------------------------------------------------------------------------- Multi Wound Chart Details Patient Name: Date of Service: Luis Pence HN T. 01/31/2021 2:30 PM Medical Record Number: [id number] Patient Account Number: [id number] Date of Birth/Sex: Treating RN: 1946-05-10 (75 y.o. [name], [name] Primary Care Carrieanne Kleen:  [name] Other Clinician: Referring Arianna Delsanto: Treating Tritia Endo/Extender: Theodosia Quay in Treatment: 26 Vital Signs Height(in): 92 Capillary Blood Glucose(mg/dl): 124 Weight(lbs): 150 Pulse(bpm): 40 Body Mass Index(BMI): 26 Blood Pressure(mmHg): 134/67 Temperature(F): 97.9 Respiratory Rate(breaths/min): 18 Photos: [2:No Photos] Left, Lateral T Fourth oe Right T Great oe Right T Fifth oe Wound Location: Gradually Appeared Not Known Not Known Wounding Event: Diabetic Wound/Ulcer of the Lower Diabetic Wound/Ulcer of the Lower Diabetic Wound/Ulcer of the Lower Primary Etiology: Extremity Extremity Extremity Congestive Heart Failure, Coronary Congestive Heart Failure, Coronary Congestive Heart Failure, Coronary Comorbid History: Artery Disease, Hypertension, Type II Artery Disease, Hypertension, Type II Artery Disease, Hypertension, Type II Diabetes, Seizure Disorder Diabetes, Seizure Disorder Diabetes, Seizure Disorder 08/16/2020 10/11/2020 10/11/2020 Date Acquired: _14 Weeks of Treatment: Healed - Epithelialized Open Open Wound Status: 0x0x0 1.2x1x0.1 1.2x1x0.1 Measurements L x W x D (cm) 0 0.942 0.942 A (cm) : rea 0 0.094 0.094 Volume (cm) : 100.00% 14.40% -139.70% % Reduction in A rea: 100.00% 14.50% -141.00% % Reduction in Volume: Grade 2 Grade 2 Grade 2 Classification: None Present None Present Small Exudate A mount: N/A N/A Serosanguineous Exudate Type: N/A N/A red, brown Exudate Color: Flat and Intact Flat and Intact Distinct, outline attached Wound Margin: None Present (0%) None Present (0%) None Present (0%) Granulation A mount: None Present (0%) Large (67-100%) Large (67-100%) Necrotic A mount: N/A Eschar Eschar Necrotic Tissue: Fascia: No Fat Layer (Subcutaneous Tissue): Yes Fat Layer (Subcutaneous Tissue): Yes Exposed Structures: Fat Layer (Subcutaneous Tissue): No Bone: Yes Fascia: No Tendon:  No Fascia: No Tendon: No Muscle: No Tendon: No Muscle: No Joint: No Muscle: No Joint: No Bone: No Joint: No Bone: No Large (67-100%) Small (1-33%) None Epithelialization: Wound Number: 6 N/A N/A Photos: N/A N/A Right T Fourth oe N/A N/A Wound Location: Gradually Appeared N/A N/A Wounding Event: Diabetic  Wound/Ulcer of the Lower N/A N/A Primary Etiology: Extremity Congestive Heart Failure, Coronary N/A N/A Comorbid History: Artery Disease, Hypertension, Type II Diabetes, Seizure Disorder 11/22/2020 N/A N/A Date Acquired: 10 N/A N/A Weeks of Treatment: Open N/A N/A Wound Status: 0.5x1.2x0.1 N/A N/A Measurements L x W x D (cm) 0.471 N/A N/A A (cm) : rea 0.047 N/A N/A Volume (cm) : 0.00% N/A N/A % Reduction in A rea: 0.00% N/A N/A % Reduction in Volume: Grade 2 N/A N/A Classification: Small N/A N/A Exudate A mount: Serosanguineous N/A N/A Exudate Type: red, brown N/A N/A Exudate Color: Distinct, outline attached N/A N/A Wound Margin: None Present (0%) N/A N/A Granulation A mount: Large (67-100%) N/A N/A Necrotic A mount: Eschar N/A N/A Necrotic Tissue: Fat Layer (Subcutaneous Tissue): Yes N/A N/A Exposed Structures: Fascia: No Tendon: No Muscle: No Joint: No Bone: No None N/A N/A Epithelialization: Treatment Notes Electronic Signature(s) Signed: 01/31/2021 5:15:22 PM By: Kalman Shan DO Signed: 01/31/2021 5:27:07 PM By: Deon Pilling Entered By: Kalman Shan on 01/31/2021 17:10:03 -------------------------------------------------------------------------------- Multi-Disciplinary Care Plan Details Patient Name: Date of Service: Luis Pence HN T. 01/31/2021 2:30 PM Medical Record Number: [id number] Patient Account Number: [id number] Date of Birth/Sex: Treating RN: 15-Sep-1946 (75 y.o. [name] Primary Care Rakeb Kibble: [name] Other Clinician: Referring Travone Georg: Treating Sina Sumpter/Extender: Theodosia Quay in Treatment: [place] reviewed with physician Active Inactive Wound/Skin Impairment Nursing Diagnoses: Impaired tissue integrity Knowledge deficit related to ulceration/compromised skin integrity Goals: Patient/caregiver will verbalize understanding of skin care regimen Date Initiated: 07/27/2020 Target Resolution Date: 03/08/2021 Goal Status: Active Ulcer/skin breakdown will have a volume reduction of 30% by week 4 Date Initiated: 07/27/2020 Date Inactivated: 09/13/2020 Target Resolution Date: 08/24/2020 Goal Status: Met Interventions: Assess patient/caregiver ability to obtain necessary supplies Assess patient/caregiver ability to perform ulcer/skin care regimen upon admission and as needed Assess ulceration(s) every visit Provide education on ulcer and skin care Treatment Activities: Skin care regimen initiated : 07/27/2020 Topical wound management initiated : 07/27/2020 Notes: Electronic Signature(s) Signed: 01/31/2021 5:27:07 PM By: Deon Pilling Entered By: Deon Pilling on 01/31/2021 15:19:55 -------------------------------------------------------------------------------- Pain Assessment Details Patient Name: Date of Service: Luis Pence HN T. 01/31/2021 2:30 PM Medical Record Number: [id number] Patient Account Number: [id number] Date of Birth/Sex: Treating RN: 12/07/1945 (75 y.o. [name] Primary Care Anthoni Geerts: [name] Other Clinician: Referring Tephanie Escorcia: Treating Daina Cara/Extender: Theodosia Quay in Treatment: 26 Active Problems Location of Pain Severity and Description of Pain Patient Has Paino Yes Site Locations Pain Location: Pain in Ulcers With Dressing Change: Yes Duration of the Pain. Constant / Intermittento Intermittent Rate the pain. Current Pain Level: 3 Character of Pain Describe the Pain: Aching, Dull Pain Management and Medication Current Pain  Management: Medication: Yes Cold Application: No Rest: No Massage: No Activity: No NicholsE.N.S.: No Heat Application: No Leg drop or elevation: No Is the Current Pain Management Adequate: Adequate How does your wound impact your activities of daily livingo Sleep: No Bathing: No Appetite: No Relationship With Others: No Bladder Continence: No Emotions: No Bowel Continence: No Work: No Toileting: No Drive: No Dressing: No Hobbies: No Engineer, maintenance) Signed: 01/31/2021 5:27:18 PM By: Levan Hurst RN, BSN Entered By: Levan Hurst on 01/31/2021 14:35:17 -------------------------------------------------------------------------------- Patient/Caregiver Education Details Patient Name: Date of Service: [name] 4/21/2022andnbsp2:30 PM Medical Record Number: [id number] Patient Account Number: [id number] Date of Birth/Gender: Treating RN: 06/16/1946 (75 y.o. [name] Primary Care Physician: [name] Other Clinician: Referring Physician: Treating  Physician/Extender: Theodosia Quay in Treatment: 26 Education Assessment Education Provided To: Patient Education Topics Provided Wound/Skin Impairment: Handouts: Skin Care Do's and Dont's Methods: Explain/Verbal Responses: Reinforcements needed Electronic Signature(s) Signed: 01/31/2021 5:27:07 PM By: Deon Pilling Entered By: Deon Pilling on 01/31/2021 15:20:07 -------------------------------------------------------------------------------- Wound Assessment Details Patient Name: Date of Service: Luis Patten T. 01/31/2021 2:30 PM Medical Record Number: [id number] Patient Account Number: [id number] Date of Birth/Sex: Treating RN: [birth year] (75 y.o. [name] Primary Care Jeany Seville: [name] Other Clinician: Referring Suprina [place]: Treating Tayana Shankle/Extender: Theodosia Quay in Treatment: 26 Wound Status Wound Number: 2 Primary  Diabetic Wound/Ulcer of the Lower Extremity Etiology: Wound Location: Left, Lateral T Fourth oe Wound Healed - Epithelialized Wounding Event: Gradually Appeared Status: Date Acquired: 08/16/2020 Comorbid Congestive Heart Failure, Coronary Artery Disease, Weeks Of Treatment: 24 History: Hypertension, Type II Diabetes, Seizure Disorder Clustered Wound: No Wound Measurements Length: (cm) Width: (cm) Depth: (cm) Area: (cm) Volume: (cm) 0 % Reduction in Area: 100% 0 % Reduction in Volume: 100% 0 Epithelialization: Large (67-100%) 0 Tunneling: No 0 Undermining: No Wound Description Classification: Grade 2 Wound Margin: Flat and Intact Exudate Amount: None Present Foul Odor After Cleansing: No Slough/Fibrino No Wound Bed Granulation Amount: None Present (0%) Exposed Structure Necrotic Amount: None Present (0%) Fascia Exposed: No Fat Layer (Subcutaneous Tissue) Exposed: No Tendon Exposed: No Muscle Exposed: No Joint Exposed: No Bone Exposed: No Electronic Signature(s) Signed: 01/31/2021 5:27:18 PM By: Levan Hurst RN, BSN Entered By: Levan Hurst on 01/31/2021 14:46:00 -------------------------------------------------------------------------------- Wound Assessment Details Patient Name: Date of Service: Luis Pence HN T. 01/31/2021 2:30 PM Medical Record Number: [id number] Patient Account Number: [id number] Date of Birth/Sex: Treating RN: [birth year] (75 y.o. [name] Primary Care Mayelin Panos: [name] Other Clinician: Referring Edrik Rundle: Treating Temica Righetti/Extender: Theodosia Quay in Treatment: 26 Wound Status Wound Number: 4 Primary Diabetic Wound/Ulcer of the Lower Extremity Etiology: Wound Location: Right T Great oe Wound Open Wounding Event: Not Known Status: Date Acquired: 10/11/2020 Comorbid Congestive Heart Failure, Coronary Artery Disease, Weeks Of Treatment: 15 History: Hypertension, Type II Diabetes, Seizure  Disorder Clustered Wound: No Photos Wound Measurements Length: (cm) 1.2 Width: (cm) 1 Depth: (cm) 0.1 Area: (cm) 0.942 Volume: (cm) 0.094 % Reduction in Area: 14.4% % Reduction in Volume: 14.5% Epithelialization: Small (1-33%) Tunneling: No Undermining: No Wound Description Classification: Grade 2 Wound Margin: Flat and Intact Exudate Amount: None Present Wound Bed Granulation Amount: None Present (0%) Necrotic Amount: Large (67-100%) Necrotic Quality: Eschar Foul Odor After Cleansing: No Slough/Fibrino Yes Exposed Structure Fascia Exposed: No Fat Layer (Subcutaneous Tissue) Exposed: Yes Tendon Exposed: No Muscle Exposed: No Joint Exposed: No Bone Exposed: Yes Electronic Signature(s) Signed: 01/31/2021 5:27:18 PM By: Levan Hurst RN, BSN Signed: 02/05/2021 2:09:33 PM By: Sandre Kitty Entered By: Sandre Kitty on 01/31/2021 16:51:34 -------------------------------------------------------------------------------- Wound Assessment Details Patient Name: Date of Service: Luis Pence HN T. 01/31/2021 2:30 PM Medical Record Number: [id number] Patient Account Number: [id number] Date of Birth/Sex: Treating RN: [birth year] (75 y.o. [name] Primary Care Jalysa Swopes: [name] Other Clinician: Referring Quintell Bonnin: Treating Lovada Barwick/Extender: Theodosia Quay in Treatment: 26 Wound Status Wound Number: 5 Primary Diabetic Wound/Ulcer of the Lower Extremity Etiology: Wound Location: Right T Fifth oe Wound Open Wounding Event: Not Known Status: Date Acquired: 10/11/2020 Comorbid Congestive Heart Failure, Coronary Artery Disease, Weeks Of Treatment: 15 History: Hypertension, Type II Diabetes, Seizure Disorder Clustered Wound: No Photos Wound Measurements Length: (cm) 1.2 Width: (cm) 1 Depth: (  cm) 0.1 Area: (cm) 0.942 Volume: (cm) 0.094 % Reduction in Area: -139.7% % Reduction in Volume: -141% Epithelialization:  None Tunneling: No Undermining: No Wound Description Classification: Grade 2 Wound Margin: Distinct, outline attached Exudate Amount: Small Exudate Type: Serosanguineous Exudate Color: red, brown Foul Odor After Cleansing: No Slough/Fibrino No Wound Bed Granulation Amount: None Present (0%) Exposed Structure Necrotic Amount: Large (67-100%) Fascia Exposed: No Necrotic Quality: Eschar Fat Layer (Subcutaneous Tissue) Exposed: Yes Tendon Exposed: No Muscle Exposed: No Joint Exposed: No Bone Exposed: No Electronic Signature(s) Signed: 01/31/2021 5:27:18 PM By: Levan Hurst RN, BSN Signed: 02/05/2021 2:09:33 PM By: Sandre Kitty Entered By: Sandre Kitty on 01/31/2021 16:52:17 -------------------------------------------------------------------------------- Wound Assessment Details Patient Name: Date of Service: Luis Pence HN T. 01/31/2021 2:30 PM Medical Record Number: [id number] Patient Account Number: [id number] Date of Birth/Sex: Treating RN: [birth year] (75 y.o. [name] Primary Care Radley Teston: [name] Other Clinician: Referring Earley Grobe: Treating Brinn Westby/Extender: Theodosia Quay in Treatment: 26 Wound Status Wound Number: 6 Primary Diabetic Wound/Ulcer of the Lower Extremity Etiology: Wound Location: Right T Fourth oe Wound Open Wounding Event: Gradually Appeared Status: Date Acquired: 11/22/2020 Comorbid Congestive Heart Failure, Coronary Artery Disease, Weeks Of Treatment: 10 History: Hypertension, Type II Diabetes, Seizure Disorder Clustered Wound: No Photos Wound Measurements Length: (cm) 0.5 Width: (cm) 1.2 Depth: (cm) 0.1 Area: (cm) 0.471 Volume: (cm) 0.047 % Reduction in Area: 0% % Reduction in Volume: 0% Epithelialization: None Tunneling: No Undermining: No Wound Description Classification: Grade 2 Wound Margin: Distinct, outline attached Exudate Amount: Small Exudate Type:  Serosanguineous Exudate Color: red, brown Foul Odor After Cleansing: No Slough/Fibrino No Wound Bed Granulation Amount: None Present (0%) Exposed Structure Necrotic Amount: Large (67-100%) Fascia Exposed: No Necrotic Quality: Eschar Fat Layer (Subcutaneous Tissue) Exposed: Yes Tendon Exposed: No Muscle Exposed: No Joint Exposed: No Bone Exposed: No Electronic Signature(s) Signed: 01/31/2021 5:27:18 PM By: Levan Hurst RN, BSN Signed: 02/05/2021 2:09:33 PM By: Sandre Kitty Signed: 02/05/2021 2:09:33 PM By: Sandre Kitty Entered By: Sandre Kitty on 01/31/2021 16:51:56 -------------------------------------------------------------------------------- [place] Details Patient Name: Date of Service: Luis Pence HN T. 01/31/2021 2:30 PM Medical Record Number: [id number] Patient Account Number: [id number] Date of Birth/Sex: Treating RN: [birth year] (75 y.o. [name] Primary Care Emelee Rodocker: [name] Other Clinician: Referring Daenerys Buttram: Treating Elley Harp/Extender: Theodosia Quay in Treatment: 26 Vital Signs Time Taken: 14:34 Temperature (F): 97.9 Height (in): 64 Pulse (bpm): 60 Weight (lbs): 150 Respiratory Rate (breaths/min): 18 Body Mass Index (BMI): 25.7 Blood Pressure (mmHg): 134/67 Capillary Blood Glucose (mg/dl): 124 Reference Range: 80 - 120 mg / dl Notes glucose per pt report Electronic Signature(s) Signed: 01/31/2021 5:27:18 PM By: Levan Hurst RN, BSN Entered By: Levan Hurst on 01/31/2021 14:34:53

## 2021-02-06 DIAGNOSIS — I130 Hypertensive heart and chronic kidney disease with heart failure and stage 1 through stage 4 chronic kidney disease, or unspecified chronic kidney disease: Secondary | ICD-10-CM | POA: Diagnosis not present

## 2021-02-06 DIAGNOSIS — Z794 Long term (current) use of insulin: Secondary | ICD-10-CM | POA: Diagnosis not present

## 2021-02-06 DIAGNOSIS — E1122 Type 2 diabetes mellitus with diabetic chronic kidney disease: Secondary | ICD-10-CM | POA: Diagnosis not present

## 2021-02-06 DIAGNOSIS — L89892 Pressure ulcer of other site, stage 2: Secondary | ICD-10-CM | POA: Diagnosis not present

## 2021-02-06 DIAGNOSIS — I5033 Acute on chronic diastolic (congestive) heart failure: Secondary | ICD-10-CM | POA: Diagnosis not present

## 2021-02-06 DIAGNOSIS — N1832 Chronic kidney disease, stage 3b: Secondary | ICD-10-CM | POA: Diagnosis not present

## 2021-02-06 DIAGNOSIS — Z466 Encounter for fitting and adjustment of urinary device: Secondary | ICD-10-CM | POA: Diagnosis not present

## 2021-02-06 DIAGNOSIS — S90411D Abrasion, right great toe, subsequent encounter: Secondary | ICD-10-CM | POA: Diagnosis not present

## 2021-02-06 DIAGNOSIS — I69354 Hemiplegia and hemiparesis following cerebral infarction affecting left non-dominant side: Secondary | ICD-10-CM | POA: Diagnosis not present

## 2021-02-06 DIAGNOSIS — G20 Parkinson's disease: Secondary | ICD-10-CM | POA: Diagnosis not present

## 2021-02-07 ENCOUNTER — Inpatient Hospital Stay: Payer: Medicare Other | Attending: Oncology | Admitting: Oncology

## 2021-02-07 DIAGNOSIS — C7951 Secondary malignant neoplasm of bone: Secondary | ICD-10-CM | POA: Diagnosis not present

## 2021-02-07 DIAGNOSIS — C61 Malignant neoplasm of prostate: Secondary | ICD-10-CM | POA: Diagnosis not present

## 2021-02-07 NOTE — Progress Notes (Signed)
Hematology and Oncology Follow Up for Telemedicine Visits  [name] [id number] Jan 03, 1946 75 y.o. 02/07/2021 7:27 AM [name], MDWolters, Luis Booty, MD   I connected with [name] On 02/07/21 at  8:30 AM EDT by telephone visit and verified that I am speaking with the correct person using two identifiers.   I discussed the limitations, risks, security and privacy concerns of performing an evaluation and management service by telemedicine and the availability of in-person appointments. I also discussed with the patient that there may be a patient responsible charge related to this service. The patient expressed understanding and agreed to proceed.  Other persons participating in the visit and their role in the encounter:    Patient's location: Home Provider's location: Office    Principle Diagnosis: 75 year old with advanced prostate cancer with the disease to the bone diagnosed in July 2021.  He has castration-sensitive after presenting with Gleason score 4+5 = 9 PSA 107 and bone involvement.    Prior Therapy: Prostate biopsy completed by [name] on May 04, 2020.  He was found to have a Gleason score 4+5 = 9.  Current therapy: Androgen deprivation therapy started on July 03, 2020.    He is currently receiving that under the care of of [name]  Interim History: [name] reports no major changes in his health.  He is quite debilitated from previous stroke, Parkinson's and has wound care issues.  He has difficult time leaving the house overall but still manages to do so on a few occasions using motorized chair.  He has tolerated Eligard without any complaints under the care of [name].  He does report occasional hot flashes.   Medications: I have reviewed the patient's current medications.  Current Outpatient Medications  Medication Sig Dispense Refill  . acetaminophen (TYLENOL) 325 MG tablet Take 2 tablets (650 mg total) by mouth every 6 (six) hours as  needed for mild pain (or Fever >/= 101).    Marland Kitchen amLODipine (NORVASC) 10 MG tablet TAKE ONE TABLET BY MOUTH DAILY 90 tablet 2  . carbidopa-levodopa (SINEMET IR) 25-100 MG tablet Take 2 tablets by mouth 3 (three) times daily. Change: Take Two tabs 3 x daily 540 tablet 3  . collagenase (SANTYL) ointment Apply topically 2 (two) times daily. 15 g 0  . dantrolene (DANTRIUM) 50 MG capsule TAKE ONE CAPSULE BY MOUTH TWICE A DAY 180 capsule 1  . dipyridamole-aspirin (AGGRENOX) 200-25 MG 12hr capsule Take 1 capsule by mouth 2 (two) times daily. 180 capsule 4  . ezetimibe (ZETIA) 10 MG tablet Take 1 tablet (10 mg total) by mouth daily. 90 tablet 3  . fenofibrate (TRICOR) 48 MG tablet TAKE ONE TABLET BY MOUTH DAILY 90 tablet 3  . FOLBIC 2.5-25-2 MG TABS tablet Take 1 tablet by mouth once daily with food 90 tablet 0  . furosemide (LASIX) 20 MG tablet TAKE ONE TABLET BY MOUTH DAILY 90 tablet 3  . hydrALAZINE (APRESOLINE) 25 MG tablet Take 50 mg by mouth daily.    . insulin glargine (LANTUS) 100 UNIT/ML Solostar Pen Inject 10 Units into the skin daily. 3 mL 0  . insulin lispro (HUMALOG) 100 UNIT/ML KiwkPen Inject 4 Units into the skin 3 (three) times daily as needed (high blood sugar > 200).     Marland Kitchen KEPPRA 500 MG tablet Take 1 tablet (500 mg total) by mouth 2 (two) times daily. 180 tablet 4  . metoprolol tartrate (LOPRESSOR) 25 MG tablet Take 1 tablet (25 mg total) by  mouth daily. 90 tablet 3  . nitroGLYCERIN (NITROSTAT) 0.4 MG SL tablet Place 1 tablet (0.4 mg total) under the tongue every 5 (five) minutes as needed for chest pain. 25 tablet 3  . ONETOUCH DELICA LANCETS 83M MISC     . ONETOUCH VERIO test strip     . oxyCODONE-acetaminophen (PERCOCET/ROXICET) 5-325 MG tablet Take by mouth every 4 (four) hours as needed for severe pain.    Marland Kitchen RAPAFLO 8 MG CAPS capsule Take 8 mg by mouth daily with breakfast.     . rosuvastatin (CRESTOR) 20 MG tablet TAKE ONE TABLET BY MOUTH DAILY 90 tablet 3   No current  facility-administered medications for this visit.     Allergies:  Allergies  Allergen Reactions  . Altace [Ramipril] Cough  . Keflex [Cephalexin] Itching and Rash        Lab Results: Lab Results  Component Value Date   WBC 8.0 06/26/2020   HGB 10.0 (L) 06/26/2020   HCT 31.9 (L) 06/26/2020   MCV 96.1 06/26/2020   PLT 270 06/26/2020     Chemistry      Component Value Date/Time   NA 139 06/26/2020 0833   NA 141 08/18/2018 1529   K 4.8 06/26/2020 0833   CL 103 06/26/2020 0833   CO2 22 06/26/2020 0833   BUN 25 (H) 06/26/2020 0833   BUN 40 (H) 08/18/2018 1529   CREATININE 1.11 06/26/2020 0833   CREATININE 1.27 03/04/2018 1434   CREATININE 1.11 07/13/2013 0814      Component Value Date/Time   CALCIUM 9.5 06/26/2020 0833   ALKPHOS 71 06/26/2020 0833   AST 14 (L) 06/26/2020 0833   AST 22 03/04/2018 1434   ALT 6 06/26/2020 0833   ALT 6 03/04/2018 1434   BILITOT 0.6 06/26/2020 0833   BILITOT <0.2 (L) 03/04/2018 1434         Impression and Plan:   75 year old with:  1.  Castration-sensitive Advanced prostate cancer with disease to the bone diagnosed in July 2021.    He is currently receiving androgen deprivation therapy alone without any therapy escalation.  He is a poor candidate for any additional therapy given his overall debilitated state, inability to make it to routine office visits and overall limited life expectancy.  I recommended no additional therapy at this time and transitioning into hospice care if he has progression of disease in the future.  2. Androgen depravation: He has received it under the care of [name].  Next injection is scheduled on Monday.    3  Follow-up: Happy to reevaluate in the future as needed.   I discussed the assessment and treatment plan with the patient. The patient was provided an opportunity to ask questions and all were answered. The patient agreed with the plan and demonstrated an understanding of the  instructions.   The patient was advised to call back or seek an in-person evaluation if the symptoms worsen or if the condition fails to improve as anticipated.  I provided 20 minutes of non face-to-face telephone visit time during this encounter.  The time was dedicated to reviewing his disease status, discussing treatment options and overall prognosis.  Zola Button, MD 02/07/2021 7:27 AM

## 2021-02-11 DIAGNOSIS — C672 Malignant neoplasm of lateral wall of bladder: Secondary | ICD-10-CM | POA: Diagnosis not present

## 2021-02-11 DIAGNOSIS — C7919 Secondary malignant neoplasm of other urinary organs: Secondary | ICD-10-CM | POA: Diagnosis not present

## 2021-02-14 DIAGNOSIS — Z466 Encounter for fitting and adjustment of urinary device: Secondary | ICD-10-CM | POA: Diagnosis not present

## 2021-02-14 DIAGNOSIS — I69354 Hemiplegia and hemiparesis following cerebral infarction affecting left non-dominant side: Secondary | ICD-10-CM | POA: Diagnosis not present

## 2021-02-14 DIAGNOSIS — I5033 Acute on chronic diastolic (congestive) heart failure: Secondary | ICD-10-CM | POA: Diagnosis not present

## 2021-02-14 DIAGNOSIS — N1832 Chronic kidney disease, stage 3b: Secondary | ICD-10-CM | POA: Diagnosis not present

## 2021-02-14 DIAGNOSIS — G20 Parkinson's disease: Secondary | ICD-10-CM | POA: Diagnosis not present

## 2021-02-14 DIAGNOSIS — I130 Hypertensive heart and chronic kidney disease with heart failure and stage 1 through stage 4 chronic kidney disease, or unspecified chronic kidney disease: Secondary | ICD-10-CM | POA: Diagnosis not present

## 2021-02-14 DIAGNOSIS — S90411D Abrasion, right great toe, subsequent encounter: Secondary | ICD-10-CM | POA: Diagnosis not present

## 2021-02-14 DIAGNOSIS — E1122 Type 2 diabetes mellitus with diabetic chronic kidney disease: Secondary | ICD-10-CM | POA: Diagnosis not present

## 2021-02-14 DIAGNOSIS — L89892 Pressure ulcer of other site, stage 2: Secondary | ICD-10-CM | POA: Diagnosis not present

## 2021-02-14 DIAGNOSIS — Z794 Long term (current) use of insulin: Secondary | ICD-10-CM | POA: Diagnosis not present

## 2021-02-19 DIAGNOSIS — Z466 Encounter for fitting and adjustment of urinary device: Secondary | ICD-10-CM | POA: Diagnosis not present

## 2021-02-19 DIAGNOSIS — N1832 Chronic kidney disease, stage 3b: Secondary | ICD-10-CM | POA: Diagnosis not present

## 2021-02-19 DIAGNOSIS — S90411D Abrasion, right great toe, subsequent encounter: Secondary | ICD-10-CM | POA: Diagnosis not present

## 2021-02-19 DIAGNOSIS — L89892 Pressure ulcer of other site, stage 2: Secondary | ICD-10-CM | POA: Diagnosis not present

## 2021-02-19 DIAGNOSIS — I130 Hypertensive heart and chronic kidney disease with heart failure and stage 1 through stage 4 chronic kidney disease, or unspecified chronic kidney disease: Secondary | ICD-10-CM | POA: Diagnosis not present

## 2021-02-19 DIAGNOSIS — I69354 Hemiplegia and hemiparesis following cerebral infarction affecting left non-dominant side: Secondary | ICD-10-CM | POA: Diagnosis not present

## 2021-02-19 DIAGNOSIS — I5033 Acute on chronic diastolic (congestive) heart failure: Secondary | ICD-10-CM | POA: Diagnosis not present

## 2021-02-19 DIAGNOSIS — G20 Parkinson's disease: Secondary | ICD-10-CM | POA: Diagnosis not present

## 2021-02-19 DIAGNOSIS — Z794 Long term (current) use of insulin: Secondary | ICD-10-CM | POA: Diagnosis not present

## 2021-02-19 DIAGNOSIS — E1122 Type 2 diabetes mellitus with diabetic chronic kidney disease: Secondary | ICD-10-CM | POA: Diagnosis not present

## 2021-02-20 ENCOUNTER — Other Ambulatory Visit: Payer: Self-pay

## 2021-02-20 ENCOUNTER — Other Ambulatory Visit: Payer: Medicare Other | Admitting: Nurse Practitioner

## 2021-02-20 DIAGNOSIS — Z515 Encounter for palliative care: Secondary | ICD-10-CM

## 2021-02-20 DIAGNOSIS — R319 Hematuria, unspecified: Secondary | ICD-10-CM

## 2021-02-20 NOTE — Progress Notes (Signed)
[facility] Consult Note Telephone: [phone]  Fax: [phone]    Date of encounter: 02/20/21 PATIENT NAME: [name] [place] [id number]   [phone] (home)  DOB: June 06, 1946 MRN: [id number]  PRIMARY CARE PROVIDER:    [name], MD,  [place] [address] [phone]  REFERRING PROVIDER:   [name], MD [address] [place],  [place] [address] [phone]  RESPONSIBLE PARTY:    Contact Information    Name Relation Home Work Zearing K Relative [phone]  [phone]   [facility] Daughter   [phone]   [name] [phone]     [name]   [phone]     I met face to face with patient and family in home.  Palliative Care was asked to follow this patient by consultation request of  [name], MD to address advance care planning and complex medical decision making. This is a follow up visit.                                   ASSESSMENT AND PLAN / RECOMMENDATIONS:   Advance Care Planning/Goals of Care:  CODE STATUS: Full Code Goal of care: Goal of care is function. Directives:  Patient desires full resuscitation effort in the event of cardiac or respiratory arrest. Signed MOST form present in home, copy on [facility] EMR. Details of MOST form include full scope of treatment, antibiotics if indicated, IV fluid if indicated, feeding tube for a defined trial period.  Symptom Management/Plan: Hematuria: likely related to mass in bladder. Has catherization procedure planned for 04/04/2021. By his urologist. Patient with metastatic prostate cancer managed with Lupron injections. PSA 0.17 down from 0.23 five months ago. Patient verbalized desire to continue treatment with Lupron injections, he verbalized reassurance from his PSA results. Urine yellow and clear during visit. Wound on  toes: No evidence of infection noted on exam of wound. Continue to follow up with home health nursing and wound clinic. Wife also assist in wound dressing changes. Patient denied any pain today.  Questions and concerns were addressed.Patient and family was encouraged to call with questions and/or concerns. Provided general support and encouragement.  Follow up Palliative Care Visit: Palliative care will continue to follow for complex medical decision making, advance care planning, and clarification of goals. Return in about 4 weeks or prn.  I spent 50 minutes providing this consultation. More than 50% of the time in this consultation was spent in counseling and care coordination.  PPS: 40%  HOSPICE ELIGIBILITY/DIAGNOSIS: TBD  CHIEF COMPLAIN: follow up on hemeturia  History obtained from review of Epic EMR and discussion with [name] and his ex-wife.  HISTORY OF PRESENT ILLNESS:Luis T Cheshireis a 75 y.o.year old malewith multiple medical problems including Prostate cancerwith metsbones and pelvis ( on Lupron injections),CHF (EF 60-65%), Parkinson disease,Type 2 diabetes (A1c 5.7), seizure disorder, leftarm paralysisfrom prior CVA.Family report reoccurrence of hematuria, report symptoms reoccurs intermittently, last episode was 2 days ago. Patient underwent a bladder cystoscopy and found to have two tumors, he is scheduled for catheriztion procedure on 04/04/2021. Patient currently on 5 days course of Bactrim for UTI, first dose was today and tolerated medication. Today, he denied fever, denied chills, denied SOB, denied abdominal pain. Continues to use his motorized wheelchair and wheelchair accessible Lucianne Lei for medical appointments.  I reviewed available labs, medications, imaging, studies and related documents from the EMR.  Records reviewed and summarized above.   Physical Exam: EYES: anicteric sclera, no discharge  ENMT: intact hearing, oral mucous membranes moist CV: no LE  edema Pulmonary: no increased work of breathing, no cough, no audible wheezes, room air Abdomen: intake100%,no ascites YM:EBRAXEN foley cath, urine clear and yellow MMH:[id number], decreased ROM in all extremities,left arm contracture and paralysis,non ambulatory Skin: warm and dry, no rashes, ulcer on right great toe and 4th toes healing Neuro: weakness Psych: non-anxious affecttoday, A and Ox 3 Hem/lymph/immuno: no widespread bruising  Past Medical History:  Diagnosis Date  . At high risk for falls   . Bladder cancer [facility])    urologist-  dr Gaynelle Arabian  . CAD (coronary artery disease) cardiologist-  dr berry  . Cerebrovascular arteriosclerosis   . Gait disturbance, post-stroke    uses cane, rollator, and w/c long distance  . Hemiparkinsonism [facility]) neurologist-- dr Leta Baptist   right side body  . History of basal cell carcinoma excision    Sept 2016--  MOH's surgery side of nose  . History of carotid artery stenosis cardiologist-- dr berry   bilateral --  1999 s/p  bilateral ICA endarterectomy and recurrent restenosis 2012  s/p  staged bilateral stenting   per last duplex 2015  stents widely patent  . History of CVA with residual deficit neurologist-  dr Leta Baptist   3/ 1999  Right MCA  and  12/ 2004  Anterior division of  Right MCA ---  residual left spactic hemiparesis and left foot drop  . History of MI (myocardial infarction)    02/ 2004   s/p  cabg   . HOH (hard of hearing)    LEFT EAR  POST cva  . Hyperextension deformity of left knee    wears brace  . Hyperlipidemia   . Hypertension   . Left foot drop    residual from CVA  -- wears brace  . Left spastic hemiparesis ([place])    residual CVA 1999  . Pancreatic pseudocyst   . Peripheral arterial disease ([place])   . Prostate cancer ([place])   . S/P CABG x 4    02/ 2004  . Seizure disorder [facility]) last seizure 2011 due to confusion   neurologist-  dr Leta Baptist-- per note seizure documented 2008 breakthrough  partial complex seizure (prior tonic-clonic seizure's post CVA)  . Type 2 diabetes, diet controlled ([place])     2  . Visual neglect    LEFT EYE   Current Outpatient Medications on File Prior to Visit  Medication Sig Dispense Refill  . acetaminophen (TYLENOL) 325 MG tablet Take 2 tablets (650 mg total) by mouth every 6 (six) hours as needed for mild pain (or Fever >/= 101).    Marland Kitchen amLODipine (NORVASC) 10 MG tablet TAKE ONE TABLET BY MOUTH DAILY 90 tablet 2  . carbidopa-levodopa (SINEMET IR) 25-100 MG tablet Take 2 tablets by mouth 3 (three) times daily. Change: Take Two tabs 3 x daily 540 tablet 3  . collagenase (SANTYL) ointment Apply topically 2 (two) times daily. 15 g 0  . dantrolene (DANTRIUM) 50 MG capsule TAKE ONE CAPSULE BY MOUTH TWICE A DAY 180 capsule 1  . dipyridamole-aspirin (AGGRENOX) 200-25 MG 12hr capsule Take 1 capsule by mouth 2 (two) times daily. 180 capsule 4  . ezetimibe (ZETIA) 10 MG tablet Take 1 tablet (10 mg total) by mouth daily. 90 tablet 3  . fenofibrate (TRICOR) 48 MG  tablet TAKE ONE TABLET BY MOUTH DAILY 90 tablet 3  . FOLBIC 2.5-25-2 MG TABS tablet Take 1 tablet by mouth once daily with food 90 tablet 0  . furosemide (LASIX) 20 MG tablet TAKE ONE TABLET BY MOUTH DAILY 90 tablet 3  . hydrALAZINE (APRESOLINE) 25 MG tablet Take 50 mg by mouth daily.    . insulin glargine (LANTUS) 100 UNIT/ML Solostar Pen Inject 10 Units into the skin daily. 3 mL 0  . insulin lispro (HUMALOG) 100 UNIT/ML KiwkPen Inject 4 Units into the skin 3 (three) times daily as needed (high blood sugar > 200).     Marland Kitchen KEPPRA 500 MG tablet Take 1 tablet (500 mg total) by mouth 2 (two) times daily. 180 tablet 4  . metoprolol tartrate (LOPRESSOR) 25 MG tablet Take 1 tablet (25 mg total) by mouth daily. 90 tablet 3  . nitroGLYCERIN (NITROSTAT) 0.4 MG SL tablet Place 1 tablet (0.4 mg total) under the tongue every 5 (five) minutes as needed for chest pain. 25 tablet 3  . ONETOUCH DELICA LANCETS 79J MISC     .  ONETOUCH VERIO test strip     . oxyCODONE-acetaminophen (PERCOCET/ROXICET) 5-325 MG tablet Take by mouth every 4 (four) hours as needed for severe pain.    Marland Kitchen RAPAFLO 8 MG CAPS capsule Take 8 mg by mouth daily with breakfast.     . rosuvastatin (CRESTOR) 20 MG tablet TAKE ONE TABLET BY MOUTH DAILY 90 tablet 3   No current facility-administered medications on file prior to visit.   Thank you for the opportunity to participate in the care of Mr. Leyh.  The palliative care team will continue to follow. Please call our office at [phone] if we can be of additional assistance.   Jari Favre, DNP, AGPCNP-BC  COVID-19 PATIENT SCREENING TOOL Asked and negative response unless otherwise noted:   Have you had symptoms of covid, tested positive or been in contact with someone with symptoms/positive test in the past 5-10 days?

## 2021-02-24 DIAGNOSIS — R339 Retention of urine, unspecified: Secondary | ICD-10-CM | POA: Diagnosis not present

## 2021-02-24 DIAGNOSIS — S90411D Abrasion, right great toe, subsequent encounter: Secondary | ICD-10-CM | POA: Diagnosis not present

## 2021-02-24 DIAGNOSIS — Z466 Encounter for fitting and adjustment of urinary device: Secondary | ICD-10-CM | POA: Diagnosis not present

## 2021-02-24 DIAGNOSIS — L89892 Pressure ulcer of other site, stage 2: Secondary | ICD-10-CM | POA: Diagnosis not present

## 2021-02-24 DIAGNOSIS — R1310 Dysphagia, unspecified: Secondary | ICD-10-CM | POA: Diagnosis not present

## 2021-02-24 DIAGNOSIS — Z794 Long term (current) use of insulin: Secondary | ICD-10-CM | POA: Diagnosis not present

## 2021-02-24 DIAGNOSIS — I130 Hypertensive heart and chronic kidney disease with heart failure and stage 1 through stage 4 chronic kidney disease, or unspecified chronic kidney disease: Secondary | ICD-10-CM | POA: Diagnosis not present

## 2021-02-24 DIAGNOSIS — E1122 Type 2 diabetes mellitus with diabetic chronic kidney disease: Secondary | ICD-10-CM | POA: Diagnosis not present

## 2021-02-24 DIAGNOSIS — I69354 Hemiplegia and hemiparesis following cerebral infarction affecting left non-dominant side: Secondary | ICD-10-CM | POA: Diagnosis not present

## 2021-02-24 DIAGNOSIS — I5033 Acute on chronic diastolic (congestive) heart failure: Secondary | ICD-10-CM | POA: Diagnosis not present

## 2021-02-24 DIAGNOSIS — N1832 Chronic kidney disease, stage 3b: Secondary | ICD-10-CM | POA: Diagnosis not present

## 2021-02-24 DIAGNOSIS — G20 Parkinson's disease: Secondary | ICD-10-CM | POA: Diagnosis not present

## 2021-02-26 DIAGNOSIS — Z466 Encounter for fitting and adjustment of urinary device: Secondary | ICD-10-CM | POA: Diagnosis not present

## 2021-02-26 DIAGNOSIS — I5033 Acute on chronic diastolic (congestive) heart failure: Secondary | ICD-10-CM | POA: Diagnosis not present

## 2021-02-26 DIAGNOSIS — N1832 Chronic kidney disease, stage 3b: Secondary | ICD-10-CM | POA: Diagnosis not present

## 2021-02-26 DIAGNOSIS — I69354 Hemiplegia and hemiparesis following cerebral infarction affecting left non-dominant side: Secondary | ICD-10-CM | POA: Diagnosis not present

## 2021-02-26 DIAGNOSIS — I130 Hypertensive heart and chronic kidney disease with heart failure and stage 1 through stage 4 chronic kidney disease, or unspecified chronic kidney disease: Secondary | ICD-10-CM | POA: Diagnosis not present

## 2021-02-26 DIAGNOSIS — Z794 Long term (current) use of insulin: Secondary | ICD-10-CM | POA: Diagnosis not present

## 2021-02-26 DIAGNOSIS — G20 Parkinson's disease: Secondary | ICD-10-CM | POA: Diagnosis not present

## 2021-02-26 DIAGNOSIS — S90411D Abrasion, right great toe, subsequent encounter: Secondary | ICD-10-CM | POA: Diagnosis not present

## 2021-02-26 DIAGNOSIS — E1122 Type 2 diabetes mellitus with diabetic chronic kidney disease: Secondary | ICD-10-CM | POA: Diagnosis not present

## 2021-02-26 DIAGNOSIS — L89892 Pressure ulcer of other site, stage 2: Secondary | ICD-10-CM | POA: Diagnosis not present

## 2021-02-28 ENCOUNTER — Other Ambulatory Visit: Payer: Self-pay

## 2021-02-28 ENCOUNTER — Encounter (HOSPITAL_BASED_OUTPATIENT_CLINIC_OR_DEPARTMENT_OTHER): Payer: Medicare Other | Attending: Internal Medicine | Admitting: Internal Medicine

## 2021-02-28 DIAGNOSIS — I69354 Hemiplegia and hemiparesis following cerebral infarction affecting left non-dominant side: Secondary | ICD-10-CM | POA: Insufficient documentation

## 2021-02-28 DIAGNOSIS — N183 Chronic kidney disease, stage 3 unspecified: Secondary | ICD-10-CM | POA: Insufficient documentation

## 2021-02-28 DIAGNOSIS — Z8546 Personal history of malignant neoplasm of prostate: Secondary | ICD-10-CM | POA: Insufficient documentation

## 2021-02-28 DIAGNOSIS — I5032 Chronic diastolic (congestive) heart failure: Secondary | ICD-10-CM | POA: Insufficient documentation

## 2021-02-28 DIAGNOSIS — G20 Parkinson's disease: Secondary | ICD-10-CM | POA: Insufficient documentation

## 2021-02-28 DIAGNOSIS — E1151 Type 2 diabetes mellitus with diabetic peripheral angiopathy without gangrene: Secondary | ICD-10-CM | POA: Insufficient documentation

## 2021-02-28 DIAGNOSIS — C7951 Secondary malignant neoplasm of bone: Secondary | ICD-10-CM | POA: Diagnosis not present

## 2021-02-28 DIAGNOSIS — E11621 Type 2 diabetes mellitus with foot ulcer: Secondary | ICD-10-CM | POA: Diagnosis not present

## 2021-02-28 DIAGNOSIS — I130 Hypertensive heart and chronic kidney disease with heart failure and stage 1 through stage 4 chronic kidney disease, or unspecified chronic kidney disease: Secondary | ICD-10-CM | POA: Diagnosis not present

## 2021-02-28 DIAGNOSIS — L97514 Non-pressure chronic ulcer of other part of right foot with necrosis of bone: Secondary | ICD-10-CM | POA: Insufficient documentation

## 2021-02-28 DIAGNOSIS — L97529 Non-pressure chronic ulcer of other part of left foot with unspecified severity: Secondary | ICD-10-CM | POA: Diagnosis present

## 2021-02-28 DIAGNOSIS — Z87891 Personal history of nicotine dependence: Secondary | ICD-10-CM | POA: Diagnosis not present

## 2021-02-28 DIAGNOSIS — L97528 Non-pressure chronic ulcer of other part of left foot with other specified severity: Secondary | ICD-10-CM | POA: Diagnosis not present

## 2021-02-28 DIAGNOSIS — E1122 Type 2 diabetes mellitus with diabetic chronic kidney disease: Secondary | ICD-10-CM | POA: Insufficient documentation

## 2021-02-28 NOTE — Progress Notes (Signed)
[name], [name] ([id number]) . Visit Report for 02/28/2021 Chief Complaint Document Details Patient Name: Date of Service: [name], [name] [facility] T. 02/28/2021 2:30 PM Medical Record Number: [id number] Patient Account Number: [id number] Date of Birth/Sex: Treating RN: 08-Oct-1946 (75 y.o. [name] Primary Care Provider: [name] Other Clinician: Referring Provider: Treating Provider/Extender: Theodosia Quay in Treatment: 30 Information Obtained from: Patient Chief Complaint Bilateral toe wounds Electronic Signature(s) Signed: 02/28/2021 3:59:15 PM By: Kalman Shan DO Entered By: Kalman Shan on 02/28/2021 15:17:42 -------------------------------------------------------------------------------- HPI Details Patient Name: Date of Service: [name] HN T. 02/28/2021 2:30 PM Medical Record Number: [id number] Patient Account Number: [id number] Date of Birth/Sex: Treating RN: 16-Sep-1946 (75 y.o. [name] Primary Care Provider: [name] Other Clinician: Referring Provider: Treating Provider/Extender: Theodosia Quay in Treatment: 30 History of Present Illness HPI Description: ADMISSION 07/27/2020 This is a 75 year old man who is accompanied by his wife (ex). Nevertheless she is his active caregiver. His most complicated features are remote CVA that left him with a left hemiparesis. He also has widely metastatic prostate cancer to bone which I think is contributed to increasing frailty this year and he is no longer ambulatory. He required hospitalization from 9/7 through 9/14 with respiratory failure seizures. During this time it was noted that he had an open area in the left buttock in close proximity to the gluteal cleft. His wife tells me that he has a history of perirectal abscesses. And she describes this area as starting as a painful swelling in late August certainly sounds like an abscess. When he is in the  hospital he required bedside IandD's but did not go to the OR. Has CT scan of the area showed soft tissue thickening in the perianal ulcer. At that point it was concerning for a fistula connection however a discrete abscess was not seen. Notable that he was hypoalbuminemic in the hospital but actually came out better with an albumin of 3.1 on 9/14 his wife says he is eating well. They are currently treating this with Santyl ointment and a backing wet-to-dry dressing. By description of his wife this is done quite nicely and there is certainly less debris over the wound surface. They already have a hospital bed with a level 3 pressure relief surface. The patient lies on his back when he is in bed especially at night although his wife is counseled him not to do this. They are starting sliding board transfers into his wheelchair I think with physical therapy. He has a Foley catheter in place Past medical history includes remote CVA, metastatic prostate cancer to large areas of his thoracic and lumbar spine and pelvis., Bilateral carotid artery stenosis, Parkinson's disease, type 2 diabetes, diastolic heart failure, CHF, hypertension and a recently discovered possible pancreatic mass in the head of the pancreas 08/16/2020 patient I admitted to the clinic 2-1/2 weeks ago. He had an abscess site on his left buttock that required an IandD while he was in the hospital in September. He has been using Santyl backing wet-to-dry. He is cared for her aerobically at home by his wife 2 concerning areas on his feet. A stage I pressure injury on the right heel this does not have an open wound. He also has on the lateral aspect of the left fourth toe a dry circular eschared wound. 12/2; monthly follow-up. Is a very disabled man who had an abscess in his left buttock and required an IandD. We have been using Santyl to  this area I changed to silver collagen last time he is here and this actually looks quite a bit  better The last time he was here his wife showed me an area on the left lateral fourth toe I think a pressure injury with his fifth toe. Been using Santyl here as well but not making much improvement As well she shows me he has an area on his right buttock which is very tiny pinpoint area but under illumination still obviously an open area. I wonder if this is more of a shear injury and transferring and a pressure injury per se 12/16; this is a very disabled man who has a left buttock area that required an IandD apparently an abscess. Since he has come here that he developed a area on the right buttock. His wife says that this is draining pus. Finally he has an area on the lateral part of the left fourth toe. We have been using silver collagen on the buttock and Iodoflex on the fourth toe 1/6; this is a disabled man we have been working on a left buttock area that was initially an IandD abscess injury. We have got this down to a small area in fact it appears to be epithelializing towards the base of the wound. When he was here last time he had a small draining area on the right buttock. I thought this might be a small cyst culture of the drainage was negative. I do not think this was an abscess at all While he has been here he has been developing areas on the toes on his left foot. He initially had one on the left fourth toe and then last time the right first toe. He is a type II diabetic. With he has a history of coronary artery disease. He complains of a lot of pain in the right foot 1/13; we have been working on areas on his left buttock and then on the right buttock. Both of these are healed today albeit the area on the left has some depth with skin is gone into a divot. Nevertheless I think this is a reasonable outcome. He arrived a week ago with ischemic looking wounds on his right fifth, right first and his left fourth toe. He went on to have arterial studies which we received today these  showed an ABI that was noncompressible on both sides however the great toe on the right had a TBI of 0.14 with a pressure of only 17. Monophasic waveforms on all the tibial vessels and including the popliteal on the left he had monophasic waveforms again noncompressible vessels and no recorded toe pressure. Clearly these are ischemic wounds. Fortunately he is not complaining of a lot of pain The patient has a complicated medical situation which includes widely metastatic prostate cancer to bone for which she is on palliative care he also has Parkinson's disease. I have a recent note from his neurologist at which time he asked whether they wish to de-escalate the Parkinson's regimen and the answer was no. The patient has a follow-up appointment with Dr. Gwenlyn Found on 1/28. I believe he is the patient's cardiologist but this appointment was made because of the vascular studies I think predominantly through Dr. Kennon Holter office. 2/10; patient went back to see Dr. Gwenlyn Found. He did not feel given his comorbidities that he was a candidate for endovascular therapy. His wife seems to agree with that. He has ischemic wounds on his right toes particularly the right fifth toe fourth toe  and first toe. He is not in a lot of pain unless these are manipulated 12/20/2020 on evaluation today patient appears to be doing well with regard to his feet all things considered. There does not appear to be any obvious signs of infection here. Most of the wounds are actually for the most part eschar and dry. With that being said the patient is not actually a candidate for revascularization according to Dr. Gwenlyn Found who he has seen previous. Nonetheless I think that for the time being Betadine or something of that sort just keep things clean and dry will be a good way to go. 3/24; patient has 3 areas on the dorsal right toes first fourth and fifth that are necrotic probably ischemic. He was not felt to be a candidate for revascularization  therapy. He also has an area on the left fourth toe in a similar situation. His significant other is concerned about possibility of an abscess on his buttock. He was put on antibiotics last time. I must say that when he was here before he seemed to have recurrent small abscesses but the purulence in this culture is negative I wondered about an epidermoid cyst although this area currently is not in the same spot 4/21; patient presents for his 1 month follow-up. He has chronic necrotic right toes. The affected toes include the first fourth and fifth digits. The third toe wound has closed. He uses Betadine on this daily. He has no complaints today. He denies fever chills, erythema to the wound sites. 5/19; patient presents for 1 month follow-up. He has had no issues in the past month with his bilateral toe wounds due to arterial insufficiency. He continues to use Betadine on these daily. He has no complaints today. He denies signs of infection. Electronic Signature(s) Signed: 02/28/2021 3:59:15 PM By: Kalman Shan DO Entered By: Kalman Shan on 02/28/2021 15:18:22 -------------------------------------------------------------------------------- Physical Exam Details Patient Name: Date of Service: [name] HN T. 02/28/2021 2:30 PM Medical Record Number: [id number] Patient Account Number: [id number] Date of Birth/Sex: Treating RN: January 04, 1946 (75 y.o. [name] Primary Care Provider: [name] Other Clinician: Referring Provider: Treating Provider/Extender: Theodosia Quay in Treatment: 30 Constitutional respirations regular, non-labored and within target range for patient.Marland Kitchen Psychiatric pleasant and cooperative. Notes Right foot: He has eschar to the dorsal first fourth and fifth toes. There are no signs of infection. Foot and leg are warm. Electronic Signature(s) Signed: 02/28/2021 3:59:15 PM By: Kalman Shan DO Entered By: Kalman Shan on  02/28/2021 15:19:29 -------------------------------------------------------------------------------- Physician Orders Details Patient Name: Date of Service: [name] HN T. 02/28/2021 2:30 PM Medical Record Number: [id number] Patient Account Number: [id number] Date of Birth/Sex: Treating RN: 10-Dec-1945 (75 y.o. [name], [name] Primary Care Provider: [name] Other Clinician: Referring Provider: Treating Provider/Extender: Theodosia Quay in Treatment: 30 Verbal / Phone Orders: No Diagnosis Coding ICD-10 Coding Code Description [phone] Non-pressure chronic ulcer of other part of left foot with other specified severity L97.514 Non-pressure chronic ulcer of other part of right foot with necrosis of bone E11.51 Type 2 diabetes mellitus with diabetic peripheral angiopathy without gangrene Follow-up Appointments ppointment in: - 6 weeks out. Return A Edema Control - Lymphedema / SCD / Other Moisturize legs daily. Off-Loading Low air-loss mattress (Group 2) - CONTINUE TO USE. Turn and reposition every 2 hours Other: - CONTINUE TO USE BUNNY BOOTS FOR HEEL PROTECTION. UP IN WHEELCHAIR NO MORE THAT 2 HOUR INCREMENTS. Ensure to relieve pressure off buttock closed  areas to prevent reopening. Home Health No change in wound care orders this week; continue Home Health for wound care. May utilize formulary equivalent dressing for wound treatment orders unless otherwise specified. - [facility] weekly. Wound Treatment Wound #4 - T Great oe Wound Laterality: Right Cleanser: Wound Cleanser (Home Health) 1 x Per Day/30 Days Discharge Instructions: Cleanse the wound with wound cleanser prior to applying a clean dressing using gauze sponges, not tissue or cotton balls. Prim Dressing: Betadine 1 x Per Day/30 Days ary Discharge Instructions: apply betadine to wound. Secondary Dressing: Woven Gauze Sponges 2x2 in (Home Health) 1 x Per Day/30  Days Discharge Instructions: Apply over primary dressing as directed. Secured With: Insurance underwriter, Sterile 2x75 (in/in) (Home Health) 1 x Per Day/30 Days Discharge Instructions: Secure with stretch gauze as directed. Wound #5 - T Fifth oe Wound Laterality: Right Cleanser: Wound Cleanser (Home Health) 1 x Per Day/30 Days Discharge Instructions: Cleanse the wound with wound cleanser prior to applying a clean dressing using gauze sponges, not tissue or cotton balls. Prim Dressing: Betadine 1 x Per Day/30 Days ary Discharge Instructions: apply betadine to wound. Secondary Dressing: Woven Gauze Sponges 2x2 in (Home Health) 1 x Per Day/30 Days Discharge Instructions: Apply over primary dressing as directed. Secured With: Insurance underwriter, Sterile 2x75 (in/in) (Home Health) 1 x Per Day/30 Days Discharge Instructions: Secure with stretch gauze as directed. Wound #6 - T Fourth oe Wound Laterality: Right Cleanser: Wound Cleanser (Home Health) 1 x Per Day/30 Days Discharge Instructions: Cleanse the wound with wound cleanser prior to applying a clean dressing using gauze sponges, not tissue or cotton balls. Prim Dressing: Betadine 1 x Per Day/30 Days ary Discharge Instructions: apply betadine to wound. Secondary Dressing: Woven Gauze Sponges 2x2 in (Home Health) 1 x Per Day/30 Days Discharge Instructions: Apply over primary dressing as directed. Secured With: Insurance underwriter, Sterile 2x75 (in/in) (Home Health) 1 x Per Day/30 Days Discharge Instructions: Secure with stretch gauze as directed. Electronic Signature(s) Signed: 02/28/2021 3:59:15 PM By: Geralyn Corwin DO Entered By: Geralyn Corwin on 02/28/2021 15:51:51 -------------------------------------------------------------------------------- Problem List Details Patient Name: Date of Service: Particia Jasper HN T. 02/28/2021 2:30 PM Medical Record Number: [id number] Patient Account Number:  [id number] Date of Birth/Sex: Treating RN: 1946-09-11 (75 y.o. Tammy Sours Primary Care Provider: Mila Palmer Other Clinician: Referring Provider: Treating Provider/Extender: Burman Riis in Treatment: 30 Active Problems ICD-10 Encounter Code Description Active Date MDM Diagnosis L97.528 Non-pressure chronic ulcer of other part of left foot with other specified 08/16/2020 No Yes severity L97.514 Non-pressure chronic ulcer of other part of right foot with necrosis of bone 10/18/2020 No Yes E11.51 Type 2 diabetes mellitus with diabetic peripheral angiopathy without gangrene 10/18/2020 No Yes Inactive Problems ICD-10 Code Description Active Date Inactive Date L89.611 Pressure ulcer of right heel, stage 1 08/16/2020 08/16/2020 L89.312 Pressure ulcer of right buttock, stage 2 09/13/2020 09/13/2020 Resolved Problems ICD-10 Code Description Active Date Resolved Date L02.31 Cutaneous abscess of buttock 07/27/2020 07/27/2020 S31.829D Unspecified open wound of left buttock, subsequent encounter 07/27/2020 07/27/2020 Electronic Signature(s) Signed: 02/28/2021 3:59:15 PM By: Geralyn Corwin DO Entered By: Geralyn Corwin on 02/28/2021 15:11:31 -------------------------------------------------------------------------------- Progress Note Details Patient Name: Date of Service: Particia Jasper HN T. 02/28/2021 2:30 PM Medical Record Number: [id number] Patient Account Number: [id number] Date of Birth/Sex: Treating RN: June 23, 1946 (75 y.o. Tammy Sours Primary Care Provider: Mila Palmer Other Clinician: Referring Provider: Treating Provider/Extender: Burman Riis  in Treatment: 30 Subjective Chief Complaint Information obtained from Patient Bilateral toe wounds History of Present Illness (HPI) ADMISSION 07/27/2020 This is a 75 year old man who is accompanied by his wife (ex). Nevertheless she is his active caregiver. His most  complicated features are remote CVA that left him with a left hemiparesis. He also has widely metastatic prostate cancer to bone which I think is contributed to increasing frailty this year and he is no longer ambulatory. He required hospitalization from 9/7 through 9/14 with respiratory failure seizures. During this time it was noted that he had an open area in the left buttock in close proximity to the gluteal cleft. His wife tells me that he has a history of perirectal abscesses. And she describes this area as starting as a painful swelling in late August certainly sounds like an abscess. When he is in the hospital he required bedside IandD's but did not go to the OR. Has CT scan of the area showed soft tissue thickening in the perianal ulcer. At that point it was concerning for a fistula connection however a discrete abscess was not seen. Notable that he was hypoalbuminemic in the hospital but actually came out better with an albumin of 3.1 on 9/14 his wife says he is eating well. They are currently treating this with Santyl ointment and a backing wet-to-dry dressing. By description of his wife this is done quite nicely and there is certainly less debris over the wound surface. They already have a hospital bed with a level 3 pressure relief surface. The patient lies on his back when he is in bed especially at night although his wife is counseled him not to do this. They are starting sliding board transfers into his wheelchair I think with physical therapy. He has a Foley catheter in place Past medical history includes remote CVA, metastatic prostate cancer to large areas of his thoracic and lumbar spine and pelvis., Bilateral carotid artery stenosis, Parkinson's disease, type 2 diabetes, diastolic heart failure, CHF, hypertension and a recently discovered possible pancreatic mass in the head of the pancreas 08/16/2020 patient I admitted to the clinic 2-1/2 weeks ago. He had an abscess site on his  left buttock that required an IandD while he was in the hospital in September. He has been using Santyl backing wet-to-dry. He is cared for her aerobically at home by his wife 2 concerning areas on his feet. A stage I pressure injury on the right heel this does not have an open wound. He also has on the lateral aspect of the left fourth toe a dry circular eschared wound. 12/2; monthly follow-up. Is a very disabled man who had an abscess in his left buttock and required an IandD. We have been using Santyl to this area I changed to silver collagen last time he is here and this actually looks quite a bit better ooThe last time he was here his wife showed me an area on the left lateral fourth toe I think a pressure injury with his fifth toe. Been using Santyl here as well but not making much improvement ooAs well she shows me he has an area on his right buttock which is very tiny pinpoint area but under illumination still obviously an open area. I wonder if this is more of a shear injury and transferring and a pressure injury per se 12/16; this is a very disabled man who has a left buttock area that required an IandD apparently an abscess. Since he has come here that  he developed a area on the right buttock. His wife says that this is draining pus. Finally he has an area on the lateral part of the left fourth toe. We have been using silver collagen on the buttock and Iodoflex on the fourth toe 1/6; this is a disabled man we have been working on a left buttock area that was initially an IandD abscess injury. We have got this down to a small area in fact it appears to be epithelializing towards the base of the wound. When he was here last time he had a small draining area on the right buttock. I thought this might be a small cyst culture of the drainage was negative. I do not think this was an abscess at all While he has been here he has been developing areas on the toes on his left foot. He initially  had one on the left fourth toe and then last time the right first toe. He is a type II diabetic. With he has a history of coronary artery disease. He complains of a lot of pain in the right foot 1/13; we have been working on areas on his left buttock and then on the right buttock. Both of these are healed today albeit the area on the left has some depth with skin is gone into a divot. Nevertheless I think this is a reasonable outcome. He arrived a week ago with ischemic looking wounds on his right fifth, right first and his left fourth toe. He went on to have arterial studies which we received today these showed an ABI that was noncompressible on both sides however the great toe on the right had a TBI of 0.14 with a pressure of only 17. Monophasic waveforms on all the tibial vessels and including the popliteal on the left he had monophasic waveforms again noncompressible vessels and no recorded toe pressure. Clearly these are ischemic wounds. Fortunately he is not complaining of a lot of pain The patient has a complicated medical situation which includes widely metastatic prostate cancer to bone for which she is on palliative care he also has Parkinson's disease. I have a recent note from his neurologist at which time he asked whether they wish to de-escalate the Parkinson's regimen and the answer was no. The patient has a follow-up appointment with Dr. Gwenlyn Found on 1/28. I believe he is the patient's cardiologist but this appointment was made because of the vascular studies I think predominantly through Dr. Kennon Holter office. 2/10; patient went back to see Dr. Gwenlyn Found. He did not feel given his comorbidities that he was a candidate for endovascular therapy. His wife seems to agree with that. He has ischemic wounds on his right toes particularly the right fifth toe fourth toe and first toe. He is not in a lot of pain unless these are manipulated 12/20/2020 on evaluation today patient appears to be doing well  with regard to his feet all things considered. There does not appear to be any obvious signs of infection here. Most of the wounds are actually for the most part eschar and dry. With that being said the patient is not actually a candidate for revascularization according to Dr. Gwenlyn Found who he has seen previous. Nonetheless I think that for the time being Betadine or something of that sort just keep things clean and dry will be a good way to go. 3/24; patient has 3 areas on the dorsal right toes first fourth and fifth that are necrotic probably ischemic. He was not  felt to be a candidate for revascularization therapy. He also has an area on the left fourth toe in a similar situation. His significant other is concerned about possibility of an abscess on his buttock. He was put on antibiotics last time. I must say that when he was here before he seemed to have recurrent small abscesses but the purulence in this culture is negative I wondered about an epidermoid cyst although this area currently is not in the same spot 4/21; patient presents for his 1 month follow-up. He has chronic necrotic right toes. The affected toes include the first fourth and fifth digits. The third toe wound has closed. He uses Betadine on this daily. He has no complaints today. He denies fever chills, erythema to the wound sites. 5/19; patient presents for 1 month follow-up. He has had no issues in the past month with his bilateral toe wounds due to arterial insufficiency. He continues to use Betadine on these daily. He has no complaints today. He denies signs of infection. Patient History Information obtained from Patient. Family History Unknown History. Social History Former smoker - quit 12 years ago, Marital Status - Divorced, Alcohol Use - Never, Drug Use - No History, Caffeine Use - Rarely. Medical History Cardiovascular Patient has history of Congestive Heart Failure, Coronary Artery Disease,  Hypertension Endocrine Patient has history of Type II Diabetes Denies history of Type I Diabetes Neurologic Patient has history of Seizure Disorder Oncologic Denies history of Received Chemotherapy, Received Radiation Medical A Surgical History Notes nd Cardiovascular CVA, Carotid stenosis Genitourinary CKD stage 3 Neurologic Parkinson's, left hemiparesis, CVA Oncologic prostate cancer with mets to bone, bladder cancer Objective Constitutional respirations regular, non-labored and within target range for patient.. Vitals Time Taken: 2:27 PM, Height: 64 in, Weight: 150 lbs, BMI: 25.7, Temperature: 97.5 F, Pulse: 65 bpm, Respiratory Rate: 16 breaths/min, Blood Pressure: 133/71 mmHg, Capillary Blood Glucose: 113 mg/dl. General Notes: glucose per pt report Psychiatric pleasant and cooperative. General Notes: Right foot: He has eschar to the dorsal first fourth and fifth toes. There are no signs of infection. Foot and leg are warm. Integumentary (Hair, Skin) Wound #4 status is Open. Original cause of wound was Not Known. The date acquired was: 10/11/2020. The wound has been in treatment 19 weeks. The wound is located on the Right T Great. The wound measures 1.3cm length x 1cm width x 0.1cm depth; 1.021cm^2 area and 0.102cm^3 volume. There is bone and oe Fat Layer (Subcutaneous Tissue) exposed. There is no tunneling or undermining noted. There is a none present amount of drainage noted. The wound margin is flat and intact. There is no granulation within the wound bed. There is a large (67-100%) amount of necrotic tissue within the wound bed including Eschar. Wound #5 status is Open. Original cause of wound was Not Known. The date acquired was: 10/11/2020. The wound has been in treatment 19 weeks. The wound is located on the Right T Fifth. The wound measures 1cm length x 0.4cm width x 0.1cm depth; 0.314cm^2 area and 0.031cm^3 volume. There is Fat Layer oe (Subcutaneous Tissue) exposed.  There is no tunneling or undermining noted. There is a none present amount of drainage noted. The wound margin is distinct with the outline attached to the wound base. There is no granulation within the wound bed. There is a large (67-100%) amount of necrotic tissue within the wound bed including Eschar. Wound #6 status is Open. Original cause of wound was Gradually Appeared. The date acquired was: 11/22/2020. The  wound has been in treatment 14 weeks. The wound is located on the Right T Fourth. The wound measures 1cm length x 1.5cm width x 0.1cm depth; 1.178cm^2 area and 0.118cm^3 volume. There is oe Fat Layer (Subcutaneous Tissue) exposed. There is no tunneling or undermining noted. There is a small amount of serosanguineous drainage noted. The wound margin is distinct with the outline attached to the wound base. There is no granulation within the wound bed. There is a large (67-100%) amount of necrotic tissue within the wound bed including Eschar. 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 Overall he is doing well and these have remained stable. There are no signs of infection on exam today. He states he has home health that comes out every week to evaluate the wounds. These are ischemic due to poor arterial blood flow. He is not a candidate for revascularization. We will continue with Betadine daily. He can follow-up in 6 weeks and call if needs to be seen sooner. Plan Follow-up Appointments: Return Appointment in: - 6 weeks out. Edema Control - Lymphedema / SCD / Other: Moisturize legs daily. Off-Loading: Low air-loss mattress (Group 2) - CONTINUE TO USE. Turn and reposition every 2 hours Other: - CONTINUE  TO USE BUNNY BOOTS FOR HEEL PROTECTION. UP IN WHEELCHAIR NO MORE THAT 2 HOUR INCREMENTS. Ensure to relieve pressure off buttock closed areas to prevent reopening. Home Health: No change in wound care orders this week; continue Home Health for wound care. May utilize formulary equivalent dressing for wound treatment orders unless otherwise specified. - [facility] weekly. WOUND #4: - T Great Wound Laterality: Right oe Cleanser: Wound Cleanser (Home Health) 1 x Per Day/30 Days Discharge Instructions: Cleanse the wound with wound cleanser prior to applying a clean dressing using gauze sponges, not tissue or cotton balls. Prim Dressing: Betadine 1 x Per Day/30 Days ary Discharge Instructions: apply betadine to wound. Secondary Dressing: Woven Gauze Sponges 2x2 in (Home Health) 1 x Per Day/30 Days Discharge Instructions: Apply over primary dressing as directed. Secured With: Child psychotherapist, Sterile 2x75 (in/in) (Home Health) 1 x Per Day/30 Days Discharge Instructions: Secure with stretch gauze as directed. WOUND #5: - T Fifth Wound Laterality: Right oe Cleanser: Wound Cleanser (Home Health) 1 x Per Day/30 Days Discharge Instructions: Cleanse the wound with wound cleanser prior to applying a clean dressing using gauze sponges, not tissue or cotton balls. Prim Dressing: Betadine 1 x Per Day/30 Days ary Discharge Instructions: apply betadine to wound. Secondary Dressing: Woven Gauze Sponges 2x2 in (Home Health) 1 x Per Day/30 Days Discharge Instructions: Apply over primary dressing as directed. Secured With: Child psychotherapist, Sterile 2x75 (in/in) (Home Health) 1 x Per Day/30 Days Discharge Instructions: Secure with stretch gauze as directed. WOUND #6: - T Fourth Wound Laterality: Right oe Cleanser: Wound Cleanser (Home Health) 1 x Per Day/30 Days Discharge Instructions: Cleanse the wound with wound cleanser prior to applying a clean dressing using  gauze sponges, not tissue or cotton balls. Prim Dressing: Betadine 1 x Per Day/30 Days ary Discharge Instructions: apply betadine to wound. Secondary Dressing: Woven Gauze Sponges 2x2 in (Home Health) 1 x Per Day/30 Days Discharge Instructions: Apply over primary dressing as directed. Secured With: Clinical biochemist  Bandage, Sterile 2x75 (in/in) (Home Health) 1 x Per Day/30 Days Discharge Instructions: Secure with stretch gauze as directed. 1. Betadine daily to the necrotic toes 2. Follow-up in 6 weeks 3. Can be seen sooner if needed Electronic Signature(s) Signed: 02/28/2021 3:59:15 PM By: Kalman Shan DO Entered By: Kalman Shan on 02/28/2021 15:55:15 -------------------------------------------------------------------------------- HxROS Details Patient Name: Date of Service: [name] HN T. 02/28/2021 2:30 PM Medical Record Number: [id number] Patient Account Number: [id number] Date of Birth/Sex: Treating RN: 08-11-1946 (75 y.o. [name] Primary Care Provider: [name] Other Clinician: Referring Provider: Treating Provider/Extender: Theodosia Quay in Treatment: 30 Information Obtained From Patient Cardiovascular Medical History: Positive for: Congestive Heart Failure; Coronary Artery Disease; Hypertension Past Medical History Notes: CVA, Carotid stenosis Endocrine Medical History: Positive for: Type II Diabetes Negative for: Type I Diabetes Time with diabetes: 5 years ago Treated with: Insulin Blood sugar tested every day: Yes Tested : once a day Genitourinary Medical History: Past Medical History Notes: CKD stage 3 Neurologic Medical History: Positive for: Seizure Disorder Past Medical History Notes: Parkinson's, left hemiparesis, CVA Oncologic Medical History: Negative for: Received Chemotherapy; Received Radiation Past Medical History Notes: prostate cancer with mets to bone, bladder  cancer Immunizations Pneumococcal Vaccine: Received Pneumococcal Vaccination: Yes Implantable Devices None Family and Social History Unknown History: Yes; Former smoker - quit 12 years ago; Marital Status - Divorced; Alcohol Use: Never; Drug Use: No History; Caffeine Use: Rarely; Financial Concerns: No; Food, Clothing or Shelter Needs: No; Support System Lacking: No; Transportation Concerns: No Electronic Signature(s) Signed: 02/28/2021 3:59:15 PM By: Kalman Shan DO Signed: 02/28/2021 6:00:49 PM By: Deon Pilling Entered By: Kalman Shan on 02/28/2021 15:18:39 -------------------------------------------------------------------------------- SuperBill Details Patient Name: Date of Service: [name] HN T. 02/28/2021 Medical Record Number: [id number] Patient Account Number: [id number] Date of Birth/Sex: Treating RN: 27-May-1946 (75 y.o. [name], [name] Primary Care Provider: [name] Other Clinician: Referring Provider: Treating Provider/Extender: Theodosia Quay in Treatment: 30 Diagnosis Coding ICD-10 Codes Code Description [id number] Non-pressure chronic ulcer of other part of left foot with other specified severity L97.514 Non-pressure chronic ulcer of other part of right foot with necrosis of bone E11.51 Type 2 diabetes mellitus with diabetic peripheral angiopathy without gangrene Facility Procedures CPT4 Code: [id number] Description: [id number] - WOUND CARE VISIT-LEV 5 EST PT Modifier: Quantity: 1 Physician Procedures : CPT4 Code Description Modifier [id number] - WC PHYS LEVEL 3 - EST PT ICD-10 Diagnosis Description L97.528 Non-pressure chronic ulcer of other part of left foot with other specified severity L97.514 Non-pressure chronic ulcer of other part of right  foot with necrosis of bone E11.51 Type 2 diabetes mellitus with diabetic peripheral angiopathy without gangrene Quantity: 1 Electronic Signature(s) Signed: 02/28/2021 3:59:15 PM  By: Kalman Shan DO Entered By: Kalman Shan on 02/28/2021 15:58:53

## 2021-03-01 DIAGNOSIS — L89300 Pressure ulcer of unspecified buttock, unstageable: Secondary | ICD-10-CM | POA: Diagnosis not present

## 2021-03-01 DIAGNOSIS — B3749 Other urogenital candidiasis: Secondary | ICD-10-CM | POA: Diagnosis not present

## 2021-03-01 DIAGNOSIS — J9690 Respiratory failure, unspecified, unspecified whether with hypoxia or hypercapnia: Secondary | ICD-10-CM | POA: Diagnosis not present

## 2021-03-04 NOTE — Progress Notes (Signed)
[name], [name] ([id number]) . Visit Report for 02/28/2021 Arrival Information Details Patient Name: Date of Service: [name] [facility] [name]. 02/28/2021 2:30 PM Medical Record Number: [id number] Patient Account Number: [id number] Date of Birth/Sex: Treating RN: 1945/12/24 (75 y.o. [name] Primary Care Averyana Pillars: [name] Other Clinician: Referring Jadzia Ibsen: Treating Indigo Chaddock/Extender: Theodosia Quay in Treatment: 71 Visit Information History Since Last Visit Added or deleted any medications: No Patient Arrived: Wheel Chair Any new allergies or adverse reactions: No Arrival Time: 14:26 Had a fall or experienced change in No Accompanied By: spouse activities of daily living that may affect Transfer Assistance: None risk of falls: Patient Identification Verified: Yes Signs or symptoms of abuse/neglect since last visito No Secondary Verification Process Completed: Yes Hospitalized since last visit: No Patient Requires Transmission-Based Precautions: No Implantable device outside of the clinic excluding No Patient Has Alerts: Yes cellular tissue based products placed in the center Patient Alerts: Patient on Blood Thinner since last visit: Has Dressing in Place as Prescribed: Yes Pain Present Now: No Electronic Signature(s) Signed: 03/04/2021 5:27:53 PM By: Levan Hurst RN, BSN Entered By: Levan Hurst on 02/28/2021 14:27:14 -------------------------------------------------------------------------------- Clinic Level of Care Assessment Details Patient Name: Date of Service: [name] HN [name]. 02/28/2021 2:30 PM Medical Record Number: [id number] Patient Account Number: [id number] Date of Birth/Sex: Treating RN: [birth year] (75 y.o. [name], [name] Primary Care Nidal Rivet: [name] Other Clinician: Referring Richardson Dubree: Treating Pressley Barsky/Extender: Theodosia Quay in Treatment: 30 Clinic Level of Care Assessment  Items TOOL 4 Quantity Score X- 1 0 Use when only an EandM is performed on FOLLOW-UP visit ASSESSMENTS - Nursing Assessment / Reassessment X- 1 10 Reassessment of Co-morbidities (includes updates in patient status) X- 1 5 Reassessment of Adherence to Treatment Plan ASSESSMENTS - Wound and Skin A ssessment / Reassessment []  - 0 Simple Wound Assessment / Reassessment - one wound X- 3 5 Complex Wound Assessment / Reassessment - multiple wounds X- 1 10 Dermatologic / Skin Assessment (not related to wound area) ASSESSMENTS - Focused Assessment X- 1 5 Circumferential Edema Measurements - multi extremities X- 1 10 Nutritional Assessment / Counseling / Intervention []  - 0 Lower Extremity Assessment (monofilament, tuning fork, pulses) []  - 0 Peripheral Arterial Disease Assessment (using hand held doppler) ASSESSMENTS - Ostomy and/or Continence Assessment and Care []  - 0 Incontinence Assessment and Management []  - 0 Ostomy Care Assessment and Management (repouching, etc.) PROCESS - Coordination of Care []  - 0 Simple Patient / Family Education for ongoing care X- 1 20 Complex (extensive) Patient / Family Education for ongoing care X- 1 10 Staff obtains Programmer, systems, Records, [name] Results / Process Orders est X- 1 10 Staff telephones HHA, Nursing Homes / Clarify orders / etc []  - 0 Routine Transfer to another Facility (non-emergent condition) []  - 0 Routine Hospital Admission (non-emergent condition) []  - 0 New Admissions / Biomedical engineer / Ordering NPWT Apligraf, etc. , []  - 0 Emergency Hospital Admission (emergent condition) []  - 0 Simple Discharge Coordination X- 1 15 Complex (extensive) Discharge Coordination PROCESS - Special Needs []  - 0 Pediatric / Minor Patient Management []  - 0 Isolation Patient Management []  - 0 Hearing / Language / Visual special needs []  - 0 Assessment of Community assistance (transportation, D/C planning, etc.) []  - 0 Additional  assistance / Altered mentation []  - 0 Support Surface(s) Assessment (bed, cushion, seat, etc.) INTERVENTIONS - Wound Cleansing / Measurement []  - 0 Simple Wound Cleansing - one wound X- 3 5 Complex  Wound Cleansing - multiple wounds X- 1 5 Wound Imaging (photographs - any number of wounds) $RemoveBe'[]'NQHDWbaXZ$  - 0 Wound Tracing (instead of photographs) $RemoveBeforeD'[]'IWgEFwYPcgmxRE$  - 0 Simple Wound Measurement - one wound X- 3 5 Complex Wound Measurement - multiple wounds INTERVENTIONS - Wound Dressings X - Small Wound Dressing one or multiple wounds 3 10 $Re'[]'CMS$  - 0 Medium Wound Dressing one or multiple wounds $RemoveBeforeD'[]'hGyTrTVYoHXyEy$  - 0 Large Wound Dressing one or multiple wounds $RemoveBeforeD'[]'kuTRHfxDOuVVZu$  - 0 Application of Medications - topical $RemoveB'[]'DlMJhOdV$  - 0 Application of Medications - injection INTERVENTIONS - Miscellaneous $RemoveBeforeD'[]'nFGyAROuzUxEnu$  - 0 External ear exam $Remove'[]'HAHaJVp$  - 0 Specimen Collection (cultures, biopsies, blood, body fluids, etc.) $RemoveBefor'[]'fvZXrGLBVnSL$  - 0 Specimen(s) / Culture(s) sent or taken to Lab for analysis $RemoveBefo'[]'QRnaRTuxrNi$  - 0 Patient Transfer (multiple staff / Civil Service fast streamer / Similar devices) $RemoveBeforeDE'[]'lpBSWxEXUSBvJZp$  - 0 Simple Staple / Suture removal (25 or less) $Remove'[]'mhzsZjy$  - 0 Complex Staple / Suture removal (26 or more) $Remove'[]'XwnDthc$  - 0 Hypo / Hyperglycemic Management (close monitor of Blood Glucose) $RemoveBefore'[]'NvsaPLjlONSmu$  - 0 Ankle / Brachial Index (ABI) - do not check if billed separately X- 1 5 Vital Signs Has the patient been seen at the hospital within the last three years: Yes Total Score: 180 Level Of Care: New/Established - Level 5 Electronic Signature(s) Signed: 02/28/2021 6:00:49 PM By: Deon Pilling Entered By: Deon Pilling on 02/28/2021 15:07:43 -------------------------------------------------------------------------------- Encounter Discharge Information Details Patient Name: Date of Service: [name] HN [name]. 02/28/2021 2:30 PM Medical Record Number: [id number] Patient Account Number: [id number] Date of Birth/Sex: Treating RN: 12/10/1945 (75 y.o. Ernestene Mention Primary Care Roan Miklos: [name] Other Clinician: Referring  Cyann Venti: Treating Chrissy Ealey/Extender: Theodosia Quay in Treatment: 30 Encounter Discharge Information Items Discharge Condition: Stable Ambulatory Status: Wheelchair Discharge Destination: Home Transportation: Private Auto Accompanied By: spouse Schedule Follow-up Appointment: Yes Clinical Summary of Care: Patient Declined Electronic Signature(s) Signed: 02/28/2021 5:19:49 PM By: Baruch Gouty RN, BSN Entered By: Baruch Gouty on 02/28/2021 15:32:54 -------------------------------------------------------------------------------- Lower Extremity Assessment Details Patient Name: Date of Service: [name] HN [name]. 02/28/2021 2:30 PM Medical Record Number: [id number] Patient Account Number: [id number] Date of Birth/Sex: Treating RN: 1946/10/03 (75 y.o. [name] Primary Care Evelene Roussin: [name] Other Clinician: Referring Yarenis Cerino: Treating Imani Fiebelkorn/Extender: Edgardo Roys Weeks in Treatment: 30 Edema Assessment Assessed: Shirlyn Goltz: No] Patrice Paradise: No] Edema: [Left: N] [Right: o] Calf Left: Right: Point of Measurement: 27 cm From Medial Instep 23.5 cm 25.5 cm Ankle Left: Right: Point of Measurement: 9 cm From Medial Instep 18 cm 17 cm Vascular Assessment Pulses: Dorsalis Pedis Palpable: [Right:Yes] Electronic Signature(s) Signed: 03/04/2021 5:27:53 PM By: Levan Hurst RN, BSN Entered By: Levan Hurst on 02/28/2021 14:37:57 -------------------------------------------------------------------------------- Multi Wound Chart Details Patient Name: Date of Service: [name] HN [name]. 02/28/2021 2:30 PM Medical Record Number: [id number] Patient Account Number: [id number] Date of Birth/Sex: Treating RN: 12-02-1945 (75 y.o. [name] Primary Care Cheng Dec: [name] Other Clinician: Referring Alinda Egolf: Treating Lamont Glasscock/Extender: Theodosia Quay in Treatment: 30 Vital Signs Height(in):  64 Capillary Blood Glucose(mg/dl): 113 Weight(lbs): 150 Pulse(bpm): 67 Body Mass Index(BMI): 26 Blood Pressure(mmHg): 133/71 Temperature(F): 97.5 Respiratory Rate(breaths/min): 16 Photos: [4:No Photos Right [name] Great oe] [5:No Photos Right [name] Fifth oe] [6:No Photos Right [name] Fourth oe] Wound Location: [4:Not Known] [5:Not Known] [6:Gradually Appeared] Wounding Event: [4:Diabetic Wound/Ulcer of the Lower] [5:Diabetic Wound/Ulcer of the Lower] [6:Diabetic Wound/Ulcer of the Lower] Primary Etiology: [4:Extremity Congestive Heart Failure, Coronary Congestive Heart Failure, Coronary Congestive Heart Failure, Coronary] [5:Extremity] [6:Extremity] Comorbid  History: [4:Artery Disease, Hypertension, Type II Artery Disease, Hypertension, Type II Artery Disease, Hypertension, Type II Diabetes, Seizure Disorder 10/11/2020] [5:Diabetes, Seizure Disorder 10/11/2020] [6:Diabetes, Seizure Disorder  11/22/2020] Date Acquired: [4:19] [5:19] [6:14] Weeks of Treatment: [4:Open] [5:Open] [6:Open] Wound Status: [4:1.3x1x0.1] [5:1x0.4x0.1] [6:1x1.5x0.1] Measurements L x W x D (cm) [4:1.021] [5:0.314] [6:1.178] A (cm) : rea [4:0.102] [5:0.031] [6:0.118] Volume (cm) : [4:7.20%] [5:20.10%] [6:-150.10%] % Reduction in A rea: [4:7.30%] [5:20.50%] [6:-151.10%] % Reduction in Volume: [4:Grade 2] [5:Grade 2] [6:Grade 2] Classification: [4:None Present] [5:None Present] [6:Small] Exudate A mount: [4:N/A] [5:N/A] [6:Serosanguineous] Exudate Type: [4:N/A] [5:N/A] [6:red, brown] Exudate Color: [4:Flat and Intact] [5:Distinct, outline attached] [6:Distinct, outline attached] Wound Margin: [4:None Present (0%)] [5:None Present (0%)] [6:None Present (0%)] Granulation A mount: [4:Large (67-100%)] [5:Large (67-100%)] [6:Large (67-100%)] Necrotic A mount: [4:Eschar] [5:Eschar] [6:Eschar] Necrotic Tissue: [4:Fat Layer (Subcutaneous Tissue): Yes Fat Layer (Subcutaneous Tissue): Yes Fat Layer (Subcutaneous Tissue): Yes] Exposed  Structures: [4:Bone: Yes Fascia: No Tendon: No Muscle: No Joint: No Small (1-33%)] [5:Fascia: No Tendon: No Muscle: No Joint: No Bone: No None] [6:Fascia: No Tendon: No Muscle: No Joint: No Bone: No None] Treatment Notes Electronic Signature(s) Signed: 02/28/2021 3:59:15 PM By: Kalman Shan DO Signed: 02/28/2021 6:00:49 PM By: Deon Pilling Entered By: Kalman Shan on 02/28/2021 15:17:04 -------------------------------------------------------------------------------- Multi-Disciplinary Care Plan Details Patient Name: Date of Service: [name] HN [name]. 02/28/2021 2:30 PM Medical Record Number: [id number] Patient Account Number: [id number] Date of Birth/Sex: Treating RN: 1946/10/05 (75 y.o. [name] Primary Care Rogan Wigley: [name] Other Clinician: Referring Lamir Racca: Treating Ewart Carrera/Extender: Theodosia Quay in Treatment: [place] reviewed with physician Active Inactive Wound/Skin Impairment Nursing Diagnoses: Impaired tissue integrity Knowledge deficit related to ulceration/compromised skin integrity Goals: Patient/caregiver will verbalize understanding of skin care regimen Date Initiated: 07/27/2020 Target Resolution Date: 03/08/2021 Goal Status: Active Ulcer/skin breakdown will have a volume reduction of 30% by week 4 Date Initiated: 07/27/2020 Date Inactivated: 09/13/2020 Target Resolution Date: 08/24/2020 Goal Status: Met Interventions: Assess patient/caregiver ability to obtain necessary supplies Assess patient/caregiver ability to perform ulcer/skin care regimen upon admission and as needed Assess ulceration(s) every visit Provide education on ulcer and skin care Treatment Activities: Skin care regimen initiated : 07/27/2020 Topical wound management initiated : 07/27/2020 Notes: Electronic Signature(s) Signed: 02/28/2021 6:00:49 PM By: Deon Pilling Entered By: Deon Pilling on 02/28/2021  14:38:13 -------------------------------------------------------------------------------- Pain Assessment Details Patient Name: Date of Service: [name] HN [name]. 02/28/2021 2:30 PM Medical Record Number: [id number] Patient Account Number: [id number] Date of Birth/Sex: Treating RN: Nov 18, 1945 (75 y.o. [name] Primary Care Jersee Winiarski: [name] Other Clinician: Referring Iliany Losier: Treating Caidynce Muzyka/Extender: Theodosia Quay in Treatment: 30 Active Problems Location of Pain Severity and Description of Pain Patient Has Paino No Site Locations Pain Management and Medication Current Pain Management: Electronic Signature(s) Signed: 03/04/2021 5:27:53 PM By: Levan Hurst RN, BSN Entered By: Levan Hurst on 02/28/2021 14:27:50 -------------------------------------------------------------------------------- Patient/Caregiver Education Details Patient Name: Date of Service: Julieta Gutting 5/19/2022andnbsp2:30 PM Medical Record Number: [id number] Patient Account Number: [id number] Date of Birth/Gender: Treating RN: August 31, 1946 (75 y.o. [name] Primary Care Physician: [name] Other Clinician: Referring Physician: Treating Physician/Extender: Theodosia Quay in Treatment: 34 Education Assessment Education Provided To: Patient Education Topics Provided Wound/Skin Impairment: Handouts: Skin Care Do's and Dont's Methods: Explain/Verbal Responses: Reinforcements needed Electronic Signature(s) Signed: 02/28/2021 6:00:49 PM By: Deon Pilling Entered By: Deon Pilling on 02/28/2021 14:38:25 -------------------------------------------------------------------------------- Wound Assessment Details Patient Name: Date of  Service: [name] HN [name]. 02/28/2021 2:30 PM Medical Record Number: [id number] Patient Account Number: [id number] Date of Birth/Sex: Treating RN: 1946/06/18 (75 y.o. [name] Primary Care Medhansh Brinkmeier: [name] Other Clinician: Referring Christyna Letendre: Treating Amos Gaber/Extender: Edgardo Roys Weeks in Treatment: 30 Wound Status Wound Number: 4 Primary Diabetic Wound/Ulcer of the Lower Extremity Etiology: Wound Location: Right [name] Great oe Wound Open Wounding Event: Not Known Status: Date Acquired: 10/11/2020 Comorbid Congestive Heart Failure, Coronary Artery Disease, Weeks Of Treatment: 19 History: Hypertension, Type II Diabetes, Seizure Disorder Clustered Wound: No Photos Wound Measurements Length: (cm) 1.3 Width: (cm) 1 Depth: (cm) 0.1 Area: (cm) 1.021 Volume: (cm) 0.102 % Reduction in Area: 7.2% % Reduction in Volume: 7.3% Epithelialization: Small (1-33%) Tunneling: No Undermining: No Wound Description Classification: Grade 2 Wound Margin: Flat and Intact Exudate Amount: None Present Foul Odor After Cleansing: No Slough/Fibrino Yes Wound Bed Granulation Amount: None Present (0%) Exposed Structure Necrotic Amount: Large (67-100%) Fascia Exposed: No Necrotic Quality: Eschar Fat Layer (Subcutaneous Tissue) Exposed: Yes Tendon Exposed: No Muscle Exposed: No Joint Exposed: No Bone Exposed: Yes Treatment Notes Wound #4 (Toe Great) Wound Laterality: Right Cleanser Wound Cleanser Discharge Instruction: Cleanse the wound with wound cleanser prior to applying a clean dressing using gauze sponges, not tissue or cotton balls. Peri-Wound Care Topical Primary Dressing Betadine Discharge Instruction: apply betadine to wound. Secondary Dressing Woven Gauze Sponges 2x2 in Discharge Instruction: Apply over primary dressing as directed. Secured With Conforming Stretch Gauze Bandage, Sterile 2x75 (in/in) Discharge Instruction: Secure with stretch gauze as directed. Compression Wrap Compression Stockings Add-Ons Electronic Signature(s) Signed: 02/28/2021 4:57:25 PM By: Sandre Kitty Signed: 03/04/2021 5:27:53  PM By: Levan Hurst RN, BSN Entered By: Sandre Kitty on 02/28/2021 16:29:31 -------------------------------------------------------------------------------- Wound Assessment Details Patient Name: Date of Service: [name] HN [name]. 02/28/2021 2:30 PM Medical Record Number: [id number] Patient Account Number: [id number] Date of Birth/Sex: Treating RN: 1945-10-28 (75 y.o. [name] Primary Care Mckinsey Keagle: [name] Other Clinician: Referring Charlea Nardo: Treating Lora Glomski/Extender: Edgardo Roys Weeks in Treatment: 30 Wound Status Wound Number: 5 Primary Diabetic Wound/Ulcer of the Lower Extremity Etiology: Wound Location: Right [name] Fifth oe Wound Open Wounding Event: Not Known Status: Date Acquired: 10/11/2020 Comorbid Congestive Heart Failure, Coronary Artery Disease, Weeks Of Treatment: 19 History: Hypertension, Type II Diabetes, Seizure Disorder Clustered Wound: No Photos Wound Measurements Length: (cm) 1 Width: (cm) 0.4 Depth: (cm) 0.1 Area: (cm) 0.314 Volume: (cm) 0.031 % Reduction in Area: 20.1% % Reduction in Volume: 20.5% Epithelialization: None Tunneling: No Undermining: No Wound Description Classification: Grade 2 Wound Margin: Distinct, outline attached Exudate Amount: None Present Foul Odor After Cleansing: No Slough/Fibrino No Wound Bed Granulation Amount: None Present (0%) Exposed Structure Necrotic Amount: Large (67-100%) Fascia Exposed: No Necrotic Quality: Eschar Fat Layer (Subcutaneous Tissue) Exposed: Yes Tendon Exposed: No Muscle Exposed: No Joint Exposed: No Bone Exposed: No Treatment Notes Wound #5 (Toe Fifth) Wound Laterality: Right Cleanser Wound Cleanser Discharge Instruction: Cleanse the wound with wound cleanser prior to applying a clean dressing using gauze sponges, not tissue or cotton balls. Peri-Wound Care Topical Primary Dressing Betadine Discharge Instruction: apply betadine to  wound. Secondary Dressing Woven Gauze Sponges 2x2 in Discharge Instruction: Apply over primary dressing as directed. Secured With Conforming Stretch Gauze Bandage, Sterile 2x75 (in/in) Discharge Instruction: Secure with stretch gauze as directed. Compression Wrap Compression Stockings Add-Ons Electronic Signature(s) Signed: 02/28/2021 4:57:25 PM By: Sandre Kitty Signed: 03/04/2021 5:27:53 PM By: Levan Hurst RN, BSN Entered By: Rudean Curt,  Destiny on 02/28/2021 16:29:54 -------------------------------------------------------------------------------- Wound Assessment Details Patient Name: Date of Service: [name], [name] [facility] [name]. 02/28/2021 2:30 PM Medical Record Number: [id number] Patient Account Number: [id number] Date of Birth/Sex: Treating RN: 1945/12/28 (75 y.o. [name] Primary Care Amaris Delafuente: [name] Other Clinician: Referring Armari Fussell: Treating Zoriyah Scheidegger/Extender: Edgardo Roys Weeks in Treatment: 30 Wound Status Wound Number: 6 Primary Diabetic Wound/Ulcer of the Lower Extremity Etiology: Wound Location: Right [name] Fourth oe Wound Open Wounding Event: Gradually Appeared Status: Date Acquired: 11/22/2020 Comorbid Congestive Heart Failure, Coronary Artery Disease, Weeks Of Treatment: 14 History: Hypertension, Type II Diabetes, Seizure Disorder Clustered Wound: No Photos Wound Measurements Length: (cm) 1 Width: (cm) 1.5 Depth: (cm) 0.1 Area: (cm) 1.178 Volume: (cm) 0.118 % Reduction in Area: -150.1% % Reduction in Volume: -151.1% Epithelialization: None Tunneling: No Undermining: No Wound Description Classification: Grade 2 Wound Margin: Distinct, outline attached Exudate Amount: Small Exudate Type: Serosanguineous Exudate Color: red, brown Foul Odor After Cleansing: No Slough/Fibrino No Wound Bed Granulation Amount: None Present (0%) Exposed Structure Necrotic Amount: Large (67-100%) Fascia Exposed: No Necrotic Quality:  Eschar Fat Layer (Subcutaneous Tissue) Exposed: Yes Tendon Exposed: No Muscle Exposed: No Joint Exposed: No Bone Exposed: No Treatment Notes Wound #6 (Toe Fourth) Wound Laterality: Right Cleanser Wound Cleanser Discharge Instruction: Cleanse the wound with wound cleanser prior to applying a clean dressing using gauze sponges, not tissue or cotton balls. Peri-Wound Care Topical Primary Dressing Betadine Discharge Instruction: apply betadine to wound. Secondary Dressing Woven Gauze Sponges 2x2 in Discharge Instruction: Apply over primary dressing as directed. Secured With Conforming Stretch Gauze Bandage, Sterile 2x75 (in/in) Discharge Instruction: Secure with stretch gauze as directed. Compression Wrap Compression Stockings Add-Ons Electronic Signature(s) Signed: 02/28/2021 4:57:25 PM By: Sandre Kitty Signed: 03/04/2021 5:27:53 PM By: Levan Hurst RN, BSN Entered By: Sandre Kitty on 02/28/2021 16:29:04 -------------------------------------------------------------------------------- Vitals Details Patient Name: Date of Service: [name] HN [name]. 02/28/2021 2:30 PM Medical Record Number: [id number] Patient Account Number: [id number] Date of Birth/Sex: Treating RN: [birth year] (75 y.o. [name] Primary Care Phil Michels: [name] Other Clinician: Referring Caisen Mangas: Treating Artrell Lawless/Extender: Theodosia Quay in Treatment: 30 Vital Signs Time Taken: 14:27 Temperature (F): 97.5 Height (in): 64 Pulse (bpm): 65 Weight (lbs): 150 Respiratory Rate (breaths/min): 16 Body Mass Index (BMI): 25.7 Blood Pressure (mmHg): 133/71 Capillary Blood Glucose (mg/dl): 113 Reference Range: 80 - 120 mg / dl Notes glucose per pt report Electronic Signature(s) Signed: 03/04/2021 5:27:53 PM By: Levan Hurst RN, BSN Entered By: Levan Hurst on 02/28/2021 14:27:45

## 2021-03-05 DIAGNOSIS — L89892 Pressure ulcer of other site, stage 2: Secondary | ICD-10-CM | POA: Diagnosis not present

## 2021-03-05 DIAGNOSIS — L89153 Pressure ulcer of sacral region, stage 3: Secondary | ICD-10-CM | POA: Diagnosis not present

## 2021-03-06 DIAGNOSIS — G20 Parkinson's disease: Secondary | ICD-10-CM | POA: Diagnosis not present

## 2021-03-06 DIAGNOSIS — Z466 Encounter for fitting and adjustment of urinary device: Secondary | ICD-10-CM | POA: Diagnosis not present

## 2021-03-06 DIAGNOSIS — N1832 Chronic kidney disease, stage 3b: Secondary | ICD-10-CM | POA: Diagnosis not present

## 2021-03-06 DIAGNOSIS — E1122 Type 2 diabetes mellitus with diabetic chronic kidney disease: Secondary | ICD-10-CM | POA: Diagnosis not present

## 2021-03-06 DIAGNOSIS — R339 Retention of urine, unspecified: Secondary | ICD-10-CM | POA: Diagnosis not present

## 2021-03-06 DIAGNOSIS — I130 Hypertensive heart and chronic kidney disease with heart failure and stage 1 through stage 4 chronic kidney disease, or unspecified chronic kidney disease: Secondary | ICD-10-CM | POA: Diagnosis not present

## 2021-03-06 DIAGNOSIS — S90411D Abrasion, right great toe, subsequent encounter: Secondary | ICD-10-CM | POA: Diagnosis not present

## 2021-03-06 DIAGNOSIS — I5033 Acute on chronic diastolic (congestive) heart failure: Secondary | ICD-10-CM | POA: Diagnosis not present

## 2021-03-06 DIAGNOSIS — Z794 Long term (current) use of insulin: Secondary | ICD-10-CM | POA: Diagnosis not present

## 2021-03-06 DIAGNOSIS — L89892 Pressure ulcer of other site, stage 2: Secondary | ICD-10-CM | POA: Diagnosis not present

## 2021-03-06 DIAGNOSIS — I69354 Hemiplegia and hemiparesis following cerebral infarction affecting left non-dominant side: Secondary | ICD-10-CM | POA: Diagnosis not present

## 2021-03-15 DIAGNOSIS — E1122 Type 2 diabetes mellitus with diabetic chronic kidney disease: Secondary | ICD-10-CM | POA: Diagnosis not present

## 2021-03-15 DIAGNOSIS — S90411D Abrasion, right great toe, subsequent encounter: Secondary | ICD-10-CM | POA: Diagnosis not present

## 2021-03-15 DIAGNOSIS — L89892 Pressure ulcer of other site, stage 2: Secondary | ICD-10-CM | POA: Diagnosis not present

## 2021-03-15 DIAGNOSIS — Z466 Encounter for fitting and adjustment of urinary device: Secondary | ICD-10-CM | POA: Diagnosis not present

## 2021-03-15 DIAGNOSIS — N1832 Chronic kidney disease, stage 3b: Secondary | ICD-10-CM | POA: Diagnosis not present

## 2021-03-15 DIAGNOSIS — I5033 Acute on chronic diastolic (congestive) heart failure: Secondary | ICD-10-CM | POA: Diagnosis not present

## 2021-03-15 DIAGNOSIS — G20 Parkinson's disease: Secondary | ICD-10-CM | POA: Diagnosis not present

## 2021-03-15 DIAGNOSIS — I69354 Hemiplegia and hemiparesis following cerebral infarction affecting left non-dominant side: Secondary | ICD-10-CM | POA: Diagnosis not present

## 2021-03-15 DIAGNOSIS — I130 Hypertensive heart and chronic kidney disease with heart failure and stage 1 through stage 4 chronic kidney disease, or unspecified chronic kidney disease: Secondary | ICD-10-CM | POA: Diagnosis not present

## 2021-03-15 DIAGNOSIS — Z794 Long term (current) use of insulin: Secondary | ICD-10-CM | POA: Diagnosis not present

## 2021-03-20 ENCOUNTER — Encounter: Payer: Self-pay | Admitting: Physical Medicine & Rehabilitation

## 2021-03-20 ENCOUNTER — Encounter: Payer: Medicare Other | Attending: Physical Medicine & Rehabilitation | Admitting: Physical Medicine & Rehabilitation

## 2021-03-20 ENCOUNTER — Other Ambulatory Visit: Payer: Self-pay

## 2021-03-20 VITALS — BP 170/73 | HR 58 | Temp 98.2°F | Ht 64.0 in | Wt 150.0 lb

## 2021-03-20 DIAGNOSIS — G8110 Spastic hemiplegia affecting unspecified side: Secondary | ICD-10-CM | POA: Diagnosis not present

## 2021-03-20 DIAGNOSIS — G20 Parkinson's disease: Secondary | ICD-10-CM | POA: Diagnosis not present

## 2021-03-20 NOTE — Progress Notes (Signed)
Subjective:    Patient ID: [name], male    DOB: 01/30/1946, 75 y.o.   MRN: [id number]  HPI  [name] is here in follow-up of his spastic left hemiparesis and Parkinson's disease.  He continues to deal with his prostate cancer and is undergoing androgen deprivation therapy.  He is followed closely by oncology and urology.  He also has been dealing with some wound care issues along his right first, fourth, and fifth toes which are followed by the wound care clinic. The toes are healing  From a spasticity standpoint he's struggling. He is having increased tone in his left arm with tightness in his left elbow and wrist in flexion. It has caused difficulties with self-care and hygiene. He's taking dantrium for tone 50mg  bid. He had a splint which he couldn't tolerate. He stretches the left arm most days.  He's now using a powered w/c. He transfers with a sliding board. They have a new w/c Lucianne Lei which has been extremely helpful for transportation. His wife remains active in his care.   Pain Inventory Average Pain 0 Pain Right Now 0 My pain is no pain  LOCATION OF PAIN  No pain  BOWEL Number of stools per week: 5 Oral laxative use No  Type of laxative n/a Enema or suppository use No  History of colostomy No  Incontinent No   BLADDER Foley In and out cath, frequency n/a Able to self cath No  Bladder incontinence No  Frequent urination No  Leakage with coughing No  Difficulty starting stream No  Incomplete bladder emptying No    Mobility use a wheelchair needs help with transfers  Function retired  Neuro/Psych No problems in this area  Prior Studies Any changes since last visit?  no  Physicians involved in your care Any changes since last visit?  no   Family History  Problem Relation Age of Onset  . Stroke Father   . Heart attack Father   . Stroke Mother   . Diabetes Brother   . Hyperlipidemia Brother   . Hypertension Brother   . Hyperlipidemia  Sister   . Hypertension Sister   . Breast cancer Neg Hx   . Colon cancer Neg Hx   . Pancreatic cancer Neg Hx   . Prostate cancer Neg Hx    Social History   Socioeconomic History  . Marital status: Divorced    Spouse name: Not on file  . Number of children: 4  . Years of education: HS  . Highest education level: Not on file  Occupational History    Employer: RETIRED  Tobacco Use  . Smoking status: Former Smoker    Packs/day: 0.25    Years: 5.00    Pack years: 1.25    Types: Cigarettes    Quit date: 11/14/2007    Years since quitting: 13.3  . Smokeless tobacco: Never Used  Vaping Use  . Vaping Use: Never used  Substance and Sexual Activity  . Alcohol use: No  . Drug use: No  . Sexual activity: Not Currently  Other Topics Concern  . Not on file  Social History Narrative   Patient lives at home with his caregiver/ex-wife.   Caffeine- 2 cups of coffee daily   Social Determinants of Health   Financial Resource Strain: Not on file  Food Insecurity: Not on file  Transportation Needs: Not on file  Physical Activity: Not on file  Stress: Not on file  Social Connections: Not on file  Past Surgical History:  Procedure Laterality Date  . CARDIAC CATHETERIZATION  11/17/2002   significant left main disease and 3 vessel CAD, mildly depressed LV systolic  fx, 37% left renal artery stenosis  . CAROTID ENDARTERECTOMY  12/1997   [facility])   Bilateral ICA  . CAROTID STENT INSERTION Bilateral dr berry and dr Kristine Royal--  (In-stent Restenosis)  LeftICA   02-12-2011;  [place]   02-26-2011  . CORONARY ARTERY BYPASS GRAFT  2/ 2004   dr hendrickson   x3 LIMA to LAD,free right internal mammary artery to obtuse marginal 1,SVG to distal right coronary.  . CYSTOSCOPY W/ RETROGRADES Bilateral 09/10/2015   Procedure: CYSTOSCOPY WITH RETROGRADE PYELOGRAM;  Surgeon: Carolan Clines, MD;  Location[facility];  Service: Urology;  Laterality: Bilateral;  . CYSTOSCOPY WITH  BIOPSY N/A 09/10/2015   Procedure: CYSTOSCOPY WITH BIOPSY,RIGHT TRIGONAL TUMOR 1.5 CM, EXCISIONAL BIOPSY WITH TAUBER FORCEP RIGHT POSTERIOR BLADDER WALL 1 CM, EXCISIONAL BIOPSY SATELITE BLADDER WALL 1 CM;  Surgeon: Carolan Clines, MD;  Location: [facility];  Service: Urology;  Laterality: N/A;  . CYSTOSCOPY WITH BIOPSY N/A 03/26/2017   Procedure: CYSTOSCOPY WITH BIOPSY/;  Surgeon: Carolan Clines, MD;  Location: [facility];  Service: Urology;  Laterality: N/A;  . CYSTOSCOPY WITH HYDRODISTENSION AND BIOPSY N/A 10/29/2015   Procedure: REPEAT CYSTOSCOPY BIOPSY BLADDER DEEP MUSCLES BLADDER BIOPSY;  Surgeon: Carolan Clines, MD;  Location: [place];  Service: Urology;  Laterality: N/A;  . CYSTOSCOPY WITH URETHRAL DILATATION N/A 09/10/2015   Procedure: CYSTOSCOPY WITH URETHRAL DILATATION;  Surgeon: Carolan Clines, MD;  Location: [place];  Service: Urology;  Laterality: N/A;  . FULGURATION OF BLADDER TUMOR N/A 03/26/2017   Procedure: FULGURATION OF BLADDER TUMOR;  Surgeon: Carolan Clines, MD;  Location: [facility];  Service: Urology;  Laterality: N/A;  . [place]  . MOHS SURGERY  06-20-2015   side of nose  . NM MYOCAR PERF WALL MOTION  01/09/2011   dr berry   mild to mod. perfusion defect in the basal inferolateral & mid inferolaterl regions consistant with an infarct/scar, no signigicant ischemia demonstracted/  Low Risk scan, no sig. change from previous study/  normal LV function and wall motion , ef 63%  . ORIF RIGHT ANKLE FX  1998   hardware retained  . TRANSURETHRAL RESECTION OF BLADDER TUMOR N/A 09/10/2015   Procedure: CYSTO TRANSURETHRAL RESECTION OF BLADDER TUMOR (TURBT) OF LEFT POSTERIOR BLADDER TUMOR 2 CM;  Surgeon: Carolan Clines, MD;  Location: [facility];  Service: Urology;  Laterality: N/A;  . TRANSURETHRAL RESECTION OF BLADDER TUMOR Left 04/17/2016   Procedure: TRANSURETHRAL RESECTION  OF BLADDER TUMOR (TURBT);  Surgeon: Carolan Clines, MD;  Location: [facility];  Service: Urology;  Laterality: Left;  . US ECHOCARDIOGRAPHY  01/03/2011   normal LVF, ef>55%/  mild LAE/ trace MR and TR,  mild AV sclerosis without stenosis   Past Medical History:  Diagnosis Date  . At high risk for falls   . Bladder cancer [facility])    urologist-  dr Gaynelle Arabian  . CAD (coronary artery disease) cardiologist-  dr berry  . Cerebrovascular arteriosclerosis   . Gait disturbance, post-stroke    uses cane, rollator, and w/c long distance  . Hemiparkinsonism [facility]) neurologist-- dr Leta Baptist   right side body  . History of basal cell carcinoma excision    Sept 2016--  MOH's surgery side of nose  . History of carotid artery stenosis cardiologist-- dr  berry   bilateral --  1999 s/p  bilateral ICA endarterectomy and recurrent restenosis 2012  s/p  staged bilateral stenting   per last duplex 2015  stents widely patent  . History of CVA with residual deficit neurologist-  dr Leta Baptist   3/ 1999  Right MCA  and  12/ 2004  Anterior division of  Right MCA ---  residual left spactic hemiparesis and left foot drop  . History of MI (myocardial infarction)    02/ 2004   s/p  cabg   . HOH (hard of hearing)    LEFT EAR  POST cva  . Hyperextension deformity of left knee    wears brace  . Hyperlipidemia   . Hypertension   . Left foot drop    residual from CVA  -- wears brace  . Left spastic hemiparesis ([place])    residual CVA 1999  . Pancreatic pseudocyst   . Peripheral arterial disease ([place])   . Prostate cancer ([place])   . S/P CABG x 4    02/ 2004  . Seizure disorder [facility]) last seizure 2011 due to confusion   neurologist-  dr Leta Baptist-- per note seizure documented 2008 breakthrough partial complex seizure (prior tonic-clonic seizure's post CVA)  . Type 2 diabetes, diet controlled ([place])     2  . Visual neglect    LEFT EYE   BP (!) 170/73   Pulse (!) 58   Temp 98.2 F (36.8 C)   Ht 5\' 4"  (1.626 m)   Wt  150 lb (68 kg)   SpO2 98%   BMI 25.75 kg/m   Opioid Risk Score:   Fall Risk Score:  `1  Depression screen PHQ 2/9  No flowsheet data found.  Review of Systems  Constitutional: Positive for diaphoresis.  HENT: Negative.   Eyes: Negative.   Respiratory: Negative.   Cardiovascular: Negative.   Gastrointestinal: Negative.   Endocrine: Negative.   Genitourinary: Negative.   Musculoskeletal: Negative.   Skin: Negative.   Allergic/Immunologic: Negative.   Neurological: Negative.   Hematological: Negative.   Psychiatric/Behavioral: Negative.   All other systems reviewed and are negative.      Objective:   Physical Exam  General: No acute distress HEENT: EOMI, oral membranes moist Cards: reg rate  Chest: normal effort Abdomen: Soft, NT, ND Skin: basal cell on left frontal scalp. Various bruises. Extremities: no edema Psych: pleasant and appropriate  Neuro: spastic left hemiparesis. Left shoulder tight in all planes. Left elbow can be almost fully extended. Left wrist and fingers 1/4. Biceps 1-2/4. Some subluxation of wrist flexor tendons---exam stable.  Musculoskeletal: left elbow contracture mild.       ASSESSMENT:  1. Right cerebrovascular accident, spastic left hemiparesis.  2. Seizure disorder.  3. Parkinson's disease 4. Bladder cancer---urology following 5. Basal cell cancer on forehead     PLAN:  1. Continue HEP, HH therapies for ROM, functional mobility  2. Continue with Dilantin and keppra per neuro recs.   3. Will increase dantrium to TID. Just had prior rx filled so I won't send in new rx just yet. Discussed potential side effects.   -he doesn't want any more botox injections 4. BladderProstate cancer mgt per urology/oncology  5. Advised to see dermatology FOR SCALP LESION 6. Skin per wound care clinic       Fifteen minutes of face to face patient care time were spent during this visit. All questions were encouraged and answered.  Follow up with me in  12 mos

## 2021-03-20 NOTE — Patient Instructions (Addendum)
PLEASE FEEL FREE TO CALL OUR OFFICE WITH ANY PROBLEMS OR QUESTIONS ([phone])   INCREASE DANTRIUM TO 50MG  THREE X DAILY. CALL ME IF YOU HAVE ANY PROBLEMS WITH IT.

## 2021-03-21 DIAGNOSIS — E1122 Type 2 diabetes mellitus with diabetic chronic kidney disease: Secondary | ICD-10-CM | POA: Diagnosis not present

## 2021-03-21 DIAGNOSIS — G20 Parkinson's disease: Secondary | ICD-10-CM | POA: Diagnosis not present

## 2021-03-21 DIAGNOSIS — I5033 Acute on chronic diastolic (congestive) heart failure: Secondary | ICD-10-CM | POA: Diagnosis not present

## 2021-03-21 DIAGNOSIS — N1832 Chronic kidney disease, stage 3b: Secondary | ICD-10-CM | POA: Diagnosis not present

## 2021-03-21 DIAGNOSIS — Z794 Long term (current) use of insulin: Secondary | ICD-10-CM | POA: Diagnosis not present

## 2021-03-21 DIAGNOSIS — I130 Hypertensive heart and chronic kidney disease with heart failure and stage 1 through stage 4 chronic kidney disease, or unspecified chronic kidney disease: Secondary | ICD-10-CM | POA: Diagnosis not present

## 2021-03-21 DIAGNOSIS — I69354 Hemiplegia and hemiparesis following cerebral infarction affecting left non-dominant side: Secondary | ICD-10-CM | POA: Diagnosis not present

## 2021-03-21 DIAGNOSIS — Z466 Encounter for fitting and adjustment of urinary device: Secondary | ICD-10-CM | POA: Diagnosis not present

## 2021-03-21 DIAGNOSIS — L89892 Pressure ulcer of other site, stage 2: Secondary | ICD-10-CM | POA: Diagnosis not present

## 2021-03-21 DIAGNOSIS — S90411D Abrasion, right great toe, subsequent encounter: Secondary | ICD-10-CM | POA: Diagnosis not present

## 2021-03-26 DIAGNOSIS — I130 Hypertensive heart and chronic kidney disease with heart failure and stage 1 through stage 4 chronic kidney disease, or unspecified chronic kidney disease: Secondary | ICD-10-CM | POA: Diagnosis not present

## 2021-03-26 DIAGNOSIS — N1832 Chronic kidney disease, stage 3b: Secondary | ICD-10-CM | POA: Diagnosis not present

## 2021-03-26 DIAGNOSIS — S90411D Abrasion, right great toe, subsequent encounter: Secondary | ICD-10-CM | POA: Diagnosis not present

## 2021-03-26 DIAGNOSIS — G20 Parkinson's disease: Secondary | ICD-10-CM | POA: Diagnosis not present

## 2021-03-26 DIAGNOSIS — Z466 Encounter for fitting and adjustment of urinary device: Secondary | ICD-10-CM | POA: Diagnosis not present

## 2021-03-26 DIAGNOSIS — I69354 Hemiplegia and hemiparesis following cerebral infarction affecting left non-dominant side: Secondary | ICD-10-CM | POA: Diagnosis not present

## 2021-03-26 DIAGNOSIS — E1122 Type 2 diabetes mellitus with diabetic chronic kidney disease: Secondary | ICD-10-CM | POA: Diagnosis not present

## 2021-03-26 DIAGNOSIS — Z794 Long term (current) use of insulin: Secondary | ICD-10-CM | POA: Diagnosis not present

## 2021-03-26 DIAGNOSIS — I5033 Acute on chronic diastolic (congestive) heart failure: Secondary | ICD-10-CM | POA: Diagnosis not present

## 2021-03-26 DIAGNOSIS — L89892 Pressure ulcer of other site, stage 2: Secondary | ICD-10-CM | POA: Diagnosis not present

## 2021-03-27 ENCOUNTER — Telehealth: Payer: Self-pay

## 2021-03-27 MED ORDER — DANTROLENE SODIUM 50 MG PO CAPS
50.0000 mg | ORAL_CAPSULE | Freq: Three times a day (TID) | ORAL | 1 refills | Status: DC
Start: 2021-03-27 — End: 2021-12-10

## 2021-03-27 NOTE — Telephone Encounter (Signed)
Patient wife called stating that patient has increased his Dantruim to TID and it is working great. Requesting a 90 day supply be sent in for patient.

## 2021-03-27 NOTE — Telephone Encounter (Signed)
Good deal!.  90 day rx sent!

## 2021-04-01 DIAGNOSIS — B3749 Other urogenital candidiasis: Secondary | ICD-10-CM | POA: Diagnosis not present

## 2021-04-01 DIAGNOSIS — L89300 Pressure ulcer of unspecified buttock, unstageable: Secondary | ICD-10-CM | POA: Diagnosis not present

## 2021-04-01 DIAGNOSIS — J9690 Respiratory failure, unspecified, unspecified whether with hypoxia or hypercapnia: Secondary | ICD-10-CM | POA: Diagnosis not present

## 2021-04-02 DIAGNOSIS — Z466 Encounter for fitting and adjustment of urinary device: Secondary | ICD-10-CM | POA: Diagnosis not present

## 2021-04-02 DIAGNOSIS — I5033 Acute on chronic diastolic (congestive) heart failure: Secondary | ICD-10-CM | POA: Diagnosis not present

## 2021-04-02 DIAGNOSIS — L89892 Pressure ulcer of other site, stage 2: Secondary | ICD-10-CM | POA: Diagnosis not present

## 2021-04-02 DIAGNOSIS — G20 Parkinson's disease: Secondary | ICD-10-CM | POA: Diagnosis not present

## 2021-04-02 DIAGNOSIS — Z794 Long term (current) use of insulin: Secondary | ICD-10-CM | POA: Diagnosis not present

## 2021-04-02 DIAGNOSIS — S90411D Abrasion, right great toe, subsequent encounter: Secondary | ICD-10-CM | POA: Diagnosis not present

## 2021-04-02 DIAGNOSIS — I69354 Hemiplegia and hemiparesis following cerebral infarction affecting left non-dominant side: Secondary | ICD-10-CM | POA: Diagnosis not present

## 2021-04-02 DIAGNOSIS — I130 Hypertensive heart and chronic kidney disease with heart failure and stage 1 through stage 4 chronic kidney disease, or unspecified chronic kidney disease: Secondary | ICD-10-CM | POA: Diagnosis not present

## 2021-04-02 DIAGNOSIS — N1832 Chronic kidney disease, stage 3b: Secondary | ICD-10-CM | POA: Diagnosis not present

## 2021-04-02 DIAGNOSIS — E1122 Type 2 diabetes mellitus with diabetic chronic kidney disease: Secondary | ICD-10-CM | POA: Diagnosis not present

## 2021-04-04 DIAGNOSIS — C672 Malignant neoplasm of lateral wall of bladder: Secondary | ICD-10-CM | POA: Diagnosis not present

## 2021-04-09 ENCOUNTER — Other Ambulatory Visit: Payer: Self-pay

## 2021-04-09 ENCOUNTER — Other Ambulatory Visit: Payer: Medicare Other | Admitting: Nurse Practitioner

## 2021-04-09 VITALS — BP 132/64 | HR 59 | Resp 16

## 2021-04-09 DIAGNOSIS — Z515 Encounter for palliative care: Secondary | ICD-10-CM | POA: Diagnosis not present

## 2021-04-09 DIAGNOSIS — R319 Hematuria, unspecified: Secondary | ICD-10-CM | POA: Diagnosis not present

## 2021-04-09 DIAGNOSIS — M24529 Contracture, unspecified elbow: Secondary | ICD-10-CM

## 2021-04-09 NOTE — Progress Notes (Signed)
[place] Consult Note Telephone: [phone]  Fax: [phone]    Date of encounter: 04/09/21 PATIENT NAME: [name] [place] [id number]   [phone] (home)  DOB: 05/28/1946 MRN: [id number]  PRIMARY CARE PROVIDER:    [name], MD,  [place] [address] [phone]  [place]:   Luis Jordan, MD [address] [place],  [place] [address] [phone]  RESPONSIBLE PARTY:    Contact Information     Name Relation Home Work [name] Relative [phone]  [phone]   [facility] Daughter   [phone]   Kraven, Calk [phone]     mehki, klumpp   [phone]     I met face to face with patient in home. His Ex-wife [name] present during visit.  Palliative Care was asked to follow this patient by consultation request of  Luis Jordan, MD to address advance care planning and complex medical decision making. This is a follow up visit.                                  ASSESSMENT AND PLAN / RECOMMENDATIONS:   Advance Care Planning/Goals of Care: Goals include to maximize quality of life and symptom management. Our advance care planning conversation included a discussion about:    The value and importance of advance care planning  Exploration of personal, cultural or spiritual beliefs that might influence medical decisions  Exploration of goals of care in the event of a sudden injury or illness  Review of an  advance directive document . CODE STATUS: Full Code Goal of care: Patient's goal of care is function.  Directives: Patient reiterated decision for a full resuscitation effort in the event of cardiac or respiratory arrest, saying "as long as I can take a breath, I want to keep fighting".  Signed MOST from present in home, copy on [facility] EMR. No change was made on review of MOST  form today. Details of MOST form include full scope of treatment, antibiotics if indicated, IV fluid if indicated, feeding tube for a defined trial period.  Palliative care will continue to provide support to patient, family and medical team.  I spent 30 minutes providing this consultation. More than 50% of the time in this consultation was spent in counseling and care coordination. -----------------------------------------------------------------------------------  Symptom Management/Plan: Hematuria: condition improving. Urine clear and yellow today. Continue to encourage adequate oral fluid intake. Monitor for kinks in Foley cath tubing to prevent obstriction that could lead to back flow of urine. Left arm contracture: Left arm contracture related to hemiparalysis secondary to CVA improved with increase in dosage of Dantrolene. Dose increased from 92m twice a day to 536mthree times daily. Patient tolerating current dose without report of any adverse effect. Patient not currently receiving physical therapy as he was discharge due to reaching his maximum function. He currently ambulate with a motorized wheel chair. Right foot wound: wound on right great toe, right 4th and 5th toes without evidence of infection, no redness or swelling. Patient denied pain at site. Receives home health wound care nurse visits once a week. Continue supportive care. Follow up with wound care center as scheduled. Continue use of air mattress.  Provided general support and encouragement. Questions and concerns were addressed. Patient/ and his family was encouraged to call with questions and/or concerns.  Follow up Palliative Care Visit: Palliative care will continue to follow for complex medical decision making, advance care planning, and clarification of goals. Return in about 6-8 weeks or prn.  PPS: 40%  HOSPICE ELIGIBILITY/DIAGNOSIS: TBD  CHIEF COMPLAIN: bloody urine  History obtained from review of Epic EMR and  discussion with [name] and his family.  HISTORY OF PRESENT ILLNESS:  [name] is a 75 y.o. year old male with  report of blood urine in th context of cauterization procedure completed on 04/04/2021 for treatment of tumors in his bladder. Hematuria is improving, yellow and clear urine noted during visit today. Patient with known history of prostate cancer with mets to his bones and pelvis, receives routine Lupron injections. He report feeling well overall today, denied any pain, denied insomnia, report good appetite. Report mild seasonal allergy symptoms relieved by Benadryl and over the counter Mucinex. Denied fever, denied chills, denied SOB, denied chest pain. Ten systems reviewed and are negative for acute change, except as noted in the HPI.  Patient's other medical problems include CHF (EF 60-65%), Parkinson disease,Type 2 diabetes (A1c 5.7), seizure disorder, left arm paralysis from prior CVA.  I reviewed available labs, medications, imaging, studies and related documents from the EMR.  Records reviewed and summarized above.   Vitals:   04/09/21 1447  BP: 132/64  Pulse: (!) 59  Resp: 16  SpO2: 97%    Physical Exam: EYES: anicteric sclera, no discharge ENMT: intact hearing, oral mucous membranes moist CV:  no LE edema Pulmonary: no increased work of breathing, no cough, no audible wheezes, room air Abdomen:  no ascites GU: chronic foley cath, urine clear and yellow MSK: severe sarcopenia, decreased ROM in all extremities, left arm contracture and paralysis, non ambulatory Skin: warm and dry, no rashes, healing ulcer on right great toe 4th and 5th toes Neuro: generalized weakness Psych: non-anxious affect today, A and O x 4 Hem/lymph/immuno: no widespread bruising  Past Medical History:  Diagnosis Date   At high risk for falls    Bladder cancer [facility])    urologist-  dr Gaynelle Arabian   CAD (coronary artery disease) cardiologist-  dr berry   Cerebrovascular arteriosclerosis     Gait disturbance, post-stroke    uses cane, rollator, and w/c long distance   Hemiparkinsonism [facility]) neurologist-- dr Leta Baptist   right side body   History of basal cell carcinoma excision    Sept 2016--  MOH's surgery side of nose   History of carotid artery stenosis cardiologist-- dr berry   bilateral --  1999 s/p  bilateral ICA endarterectomy and recurrent restenosis 2012  s/p  staged bilateral stenting   per last duplex 2015  stents widely patent   History of CVA with residual deficit neurologist-  dr Leta Baptist   3/ 1999  Right MCA  and  12/ 2004  Anterior division of  Right MCA ---  residual left spactic hemiparesis and left foot drop   History of MI (myocardial infarction)    02/ 2004   s/p  cabg    HOH (hard of hearing)    LEFT EAR  POST cva   Hyperextension deformity of left knee    wears brace   Hyperlipidemia    Hypertension    Left foot drop    residual from CVA  -- wears brace   Left spastic hemiparesis ([facility])    residual CVA 1999   Pancreatic pseudocyst    Peripheral arterial disease ([facility])    Prostate cancer (  [place])    S/P CABG x 4    02/ 2004   Seizure disorder [facility]) last seizure 2011 due to confusion   neurologist-  dr Leta Baptist-- per note seizure documented 2008 breakthrough partial complex seizure (prior tonic-clonic seizure's post CVA)   Type 2 diabetes, diet controlled ([place])     2   Visual neglect    LEFT EYE    Thank you for the opportunity to participate in the care of Mr. Smaldone.  The palliative care team will continue to follow. Please call our office at [phone] if we can be of additional assistance.   Alba Destine, NP DNP,AGPCNP-BC  COVID-19 PATIENT SCREENING TOOL Asked and negative response unless otherwise noted:   Have you had symptoms of covid, tested positive or been in contact with someone with symptoms/positive test in the past 5-10 days?

## 2021-04-10 ENCOUNTER — Other Ambulatory Visit: Payer: Self-pay | Admitting: Cardiovascular Disease

## 2021-04-10 DIAGNOSIS — E1122 Type 2 diabetes mellitus with diabetic chronic kidney disease: Secondary | ICD-10-CM | POA: Diagnosis not present

## 2021-04-10 DIAGNOSIS — G20 Parkinson's disease: Secondary | ICD-10-CM | POA: Diagnosis not present

## 2021-04-10 DIAGNOSIS — Z466 Encounter for fitting and adjustment of urinary device: Secondary | ICD-10-CM | POA: Diagnosis not present

## 2021-04-10 DIAGNOSIS — I5033 Acute on chronic diastolic (congestive) heart failure: Secondary | ICD-10-CM | POA: Diagnosis not present

## 2021-04-10 DIAGNOSIS — Z794 Long term (current) use of insulin: Secondary | ICD-10-CM | POA: Diagnosis not present

## 2021-04-10 DIAGNOSIS — I130 Hypertensive heart and chronic kidney disease with heart failure and stage 1 through stage 4 chronic kidney disease, or unspecified chronic kidney disease: Secondary | ICD-10-CM | POA: Diagnosis not present

## 2021-04-10 DIAGNOSIS — N1832 Chronic kidney disease, stage 3b: Secondary | ICD-10-CM | POA: Diagnosis not present

## 2021-04-10 DIAGNOSIS — S90411D Abrasion, right great toe, subsequent encounter: Secondary | ICD-10-CM | POA: Diagnosis not present

## 2021-04-10 DIAGNOSIS — I69354 Hemiplegia and hemiparesis following cerebral infarction affecting left non-dominant side: Secondary | ICD-10-CM | POA: Diagnosis not present

## 2021-04-10 DIAGNOSIS — L89892 Pressure ulcer of other site, stage 2: Secondary | ICD-10-CM | POA: Diagnosis not present

## 2021-04-11 ENCOUNTER — Encounter (HOSPITAL_BASED_OUTPATIENT_CLINIC_OR_DEPARTMENT_OTHER): Payer: Medicare Other | Attending: Internal Medicine | Admitting: Internal Medicine

## 2021-04-11 ENCOUNTER — Other Ambulatory Visit: Payer: Self-pay

## 2021-04-11 DIAGNOSIS — I5032 Chronic diastolic (congestive) heart failure: Secondary | ICD-10-CM | POA: Insufficient documentation

## 2021-04-11 DIAGNOSIS — E1151 Type 2 diabetes mellitus with diabetic peripheral angiopathy without gangrene: Secondary | ICD-10-CM | POA: Diagnosis not present

## 2021-04-11 DIAGNOSIS — L97528 Non-pressure chronic ulcer of other part of left foot with other specified severity: Secondary | ICD-10-CM | POA: Diagnosis not present

## 2021-04-11 DIAGNOSIS — I130 Hypertensive heart and chronic kidney disease with heart failure and stage 1 through stage 4 chronic kidney disease, or unspecified chronic kidney disease: Secondary | ICD-10-CM | POA: Insufficient documentation

## 2021-04-11 DIAGNOSIS — L97514 Non-pressure chronic ulcer of other part of right foot with necrosis of bone: Secondary | ICD-10-CM | POA: Insufficient documentation

## 2021-04-11 DIAGNOSIS — N183 Chronic kidney disease, stage 3 unspecified: Secondary | ICD-10-CM | POA: Diagnosis not present

## 2021-04-11 DIAGNOSIS — E11621 Type 2 diabetes mellitus with foot ulcer: Secondary | ICD-10-CM | POA: Insufficient documentation

## 2021-04-11 DIAGNOSIS — Z87891 Personal history of nicotine dependence: Secondary | ICD-10-CM | POA: Diagnosis not present

## 2021-04-11 DIAGNOSIS — E1122 Type 2 diabetes mellitus with diabetic chronic kidney disease: Secondary | ICD-10-CM | POA: Insufficient documentation

## 2021-04-11 DIAGNOSIS — L0231 Cutaneous abscess of buttock: Secondary | ICD-10-CM | POA: Insufficient documentation

## 2021-04-11 NOTE — Progress Notes (Signed)
[name], [name] ([id number]) . Visit Report for 04/11/2021 Chief Complaint Document Details Patient Name: Date of Service: [name], [name] [facility] [name]. 04/11/2021 2:30 PM Medical Record Number: [id number] Patient Account Number: [id number] Date of Birth/Sex: Treating RN: 03/08/1946 (75 y.o. [name], Lauren Primary Care Provider: [name] Other Clinician: Referring Provider: Treating Provider/Extender: Theodosia Quay in Treatment: 36 Information Obtained from: Patient Chief Complaint Bilateral toe wounds Electronic Signature(s) Signed: 04/11/2021 4:22:23 PM By: Kalman Shan DO Entered By: Kalman Shan on 04/11/2021 16:17:17 -------------------------------------------------------------------------------- HPI Details Patient Name: Date of Service: [name]. 04/11/2021 2:30 PM Medical Record Number: [id number] Patient Account Number: [id number] Date of Birth/Sex: Treating RN: 03/23/1946 (75 y.o. [name], Lauren Primary Care Provider: [name] Other Clinician: Referring Provider: Treating Provider/Extender: Theodosia Quay in Treatment: 36 History of Present Illness HPI Description: ADMISSION 07/27/2020 This is a 75 year old man who is accompanied by his wife (ex). Nevertheless she is his active caregiver. His most complicated features are remote CVA that left him with a left hemiparesis. He also has widely metastatic prostate cancer to bone which I think is contributed to increasing frailty this year and he is no longer ambulatory. He required hospitalization from 9/7 through 9/14 with respiratory failure seizures. During this time it was noted that he had an open area in the left buttock in close proximity to the gluteal cleft. His wife tells me that he has a history of perirectal abscesses. And she describes this area as starting as a painful swelling in late August certainly sounds like an abscess. When he is  in the hospital he required bedside IandD's but did not go to the OR. Has CT scan of the area showed soft tissue thickening in the perianal ulcer. At that point it was concerning for a fistula connection however a discrete abscess was not seen. Notable that he was hypoalbuminemic in the hospital but actually came out better with an albumin of 3.1 on 9/14 his wife says he is eating well. They are currently treating this with Santyl ointment and a backing wet-to-dry dressing. By description of his wife this is done quite nicely and there is certainly less debris over the wound surface. They already have a hospital bed with a level 3 pressure relief surface. The patient lies on his back when he is in bed especially at night although his wife is counseled him not to do this. They are starting sliding board transfers into his wheelchair I think with physical therapy. He has a Foley catheter in place Past medical history includes remote CVA, metastatic prostate cancer to large areas of his thoracic and lumbar spine and pelvis., Bilateral carotid artery stenosis, Parkinson's disease, type 2 diabetes, diastolic heart failure, CHF, hypertension and a recently discovered possible pancreatic mass in the head of the pancreas 08/16/2020 patient I admitted to the clinic 2-1/2 weeks ago. He had an abscess site on his left buttock that required an IandD while he was in the hospital in September. He has been using Santyl backing wet-to-dry. He is cared for her aerobically at home by his wife 2 concerning areas on his feet. A stage I pressure injury on the right heel this does not have an open wound. He also has on the lateral aspect of the left fourth toe a dry circular eschared wound. 12/2; monthly follow-up. Is a very disabled man who had an abscess in his left buttock and required an IandD. We have been using Santyl to  this area I changed to silver collagen last time he is here and this actually looks quite a bit  better The last time he was here his wife showed me an area on the left lateral fourth toe I think a pressure injury with his fifth toe. Been using Santyl here as well but not making much improvement As well she shows me he has an area on his right buttock which is very tiny pinpoint area but under illumination still obviously an open area. I wonder if this is more of a shear injury and transferring and a pressure injury per se 12/16; this is a very disabled man who has a left buttock area that required an IandD apparently an abscess. Since he has come here that he developed a area on the right buttock. His wife says that this is draining pus. Finally he has an area on the lateral part of the left fourth toe. We have been using silver collagen on the buttock and Iodoflex on the fourth toe 1/6; this is a disabled man we have been working on a left buttock area that was initially an IandD abscess injury. We have got this down to a small area in fact it appears to be epithelializing towards the base of the wound. When he was here last time he had a small draining area on the right buttock. I thought this might be a small cyst culture of the drainage was negative. I do not think this was an abscess at all While he has been here he has been developing areas on the toes on his left foot. He initially had one on the left fourth toe and then last time the right first toe. He is a type II diabetic. With he has a history of coronary artery disease. He complains of a lot of pain in the right foot 1/13; we have been working on areas on his left buttock and then on the right buttock. Both of these are healed today albeit the area on the left has some depth with skin is gone into a divot. Nevertheless I think this is a reasonable outcome. He arrived a week ago with ischemic looking wounds on his right fifth, right first and his left fourth toe. He went on to have arterial studies which we received today these  showed an ABI that was noncompressible on both sides however the great toe on the right had a TBI of 0.14 with a pressure of only 17. Monophasic waveforms on all the tibial vessels and including the popliteal on the left he had monophasic waveforms again noncompressible vessels and no recorded toe pressure. Clearly these are ischemic wounds. Fortunately he is not complaining of a lot of pain The patient has a complicated medical situation which includes widely metastatic prostate cancer to bone for which she is on palliative care he also has Parkinson's disease. I have a recent note from his neurologist at which time he asked whether they wish to de-escalate the Parkinson's regimen and the answer was no. The patient has a follow-up appointment with Dr. Gwenlyn Found on 1/28. I believe he is the patient's cardiologist but this appointment was made because of the vascular studies I think predominantly through Dr. Kennon Holter office. 2/10; patient went back to see Dr. Gwenlyn Found. He did not feel given his comorbidities that he was a candidate for endovascular therapy. His wife seems to agree with that. He has ischemic wounds on his right toes particularly the right fifth toe fourth toe  and first toe. He is not in a lot of pain unless these are manipulated 12/20/2020 on evaluation today patient appears to be doing well with regard to his feet all things considered. There does not appear to be any obvious signs of infection here. Most of the wounds are actually for the most part eschar and dry. With that being said the patient is not actually a candidate for revascularization according to Dr. Gwenlyn Found who he has seen previous. Nonetheless I think that for the time being Betadine or something of that sort just keep things clean and dry will be a good way to go. 3/24; patient has 3 areas on the dorsal right toes first fourth and fifth that are necrotic probably ischemic. He was not felt to be a candidate for revascularization  therapy. He also has an area on the left fourth toe in a similar situation. His significant other is concerned about possibility of an abscess on his buttock. He was put on antibiotics last time. I must say that when he was here before he seemed to have recurrent small abscesses but the purulence in this culture is negative I wondered about an epidermoid cyst although this area currently is not in the same spot 4/21; patient presents for his 1 month follow-up. He has chronic necrotic right toes. The affected toes include the first fourth and fifth digits. The third toe wound has closed. He uses Betadine on this daily. He has no complaints today. He denies fever chills, erythema to the wound sites. 5/19; patient presents for 1 month follow-up. He has had no issues in the past month with his bilateral toe wounds due to arterial insufficiency. He continues to use Betadine on these daily. He has no complaints today. He denies signs of infection. 6/30; patient presents for 6-week follow-up. He denies any issues. He has been using Betadine to the areas of necrosis on the right toes. He denies signs of infection. Electronic Signature(s) Signed: 04/11/2021 4:22:23 PM By: Kalman Shan DO Entered By: Kalman Shan on 04/11/2021 16:18:23 -------------------------------------------------------------------------------- Physical Exam Details Patient Name: Date of Service: [name]. 04/11/2021 2:30 PM Medical Record Number: [id number] Patient Account Number: [id number] Date of Birth/Sex: Treating RN: [birth year] (75 y.o. Erie Noe Primary Care Provider: [name] Other Clinician: Referring Provider: Treating Provider/Extender: Theodosia Quay in Treatment: 36 Constitutional respirations regular, non-labored and within target range for patient.Marland Kitchen Psychiatric pleasant and cooperative. Notes Right foot: He has eschar to the dorsal first fourth and fifth  toes. There are no signs of infection. Foot and leg are warm. Electronic Signature(s) Signed: 04/11/2021 4:22:23 PM By: Kalman Shan DO Entered By: Kalman Shan on 04/11/2021 16:19:03 -------------------------------------------------------------------------------- Physician Orders Details Patient Name: Date of Service: [name]. 04/11/2021 2:30 PM Medical Record Number: [id number] Patient Account Number: [id number] Date of Birth/Sex: Treating RN: Feb 17, 1946 (75 y.o. [name], Lauren Primary Care Provider: [name] Other Clinician: Referring Provider: Treating Provider/Extender: Theodosia Quay in Treatment: 16 Verbal / Phone Orders: No Diagnosis Coding ICD-10 Coding Code Description [phone] Non-pressure chronic ulcer of other part of left foot with other specified severity L97.514 Non-pressure chronic ulcer of other part of right foot with necrosis of bone E11.51 Type 2 diabetes mellitus with diabetic peripheral angiopathy without gangrene Follow-up Appointments ppointment in: - 8 weeks out Return A Edema Control - Lymphedema / SCD / Other Moisturize legs daily. Off-Loading Low air-loss mattress (Group 2) - CONTINUE TO USE. Turn and reposition  every 2 hours Other: - CONTINUE TO USE BUNNY BOOTS FOR HEEL PROTECTION. UP IN WHEELCHAIR NO MORE THAT 2 HOUR INCREMENTS. Ensure to relieve pressure off buttock closed areas to prevent reopening. Home Health No change in wound care orders this week; continue Home Health for wound care. May utilize formulary equivalent dressing for wound treatment orders unless otherwise specified. - [facility] weekly. Wound Treatment Wound #4 - [name] Great oe Wound Laterality: Right Cleanser: Wound Cleanser (Home Health) 1 x Per Day/30 Days Discharge Instructions: Cleanse the wound with wound cleanser prior to applying a clean dressing using gauze sponges, not tissue or cotton balls. Prim  Dressing: Betadine 1 x Per Day/30 Days ary Discharge Instructions: apply betadine to wound. Secondary Dressing: Woven Gauze Sponges 2x2 in (Home Health) 1 x Per Day/30 Days Discharge Instructions: Apply over primary dressing as directed. Secured With: Child psychotherapist, Sterile 2x75 (in/in) (Home Health) 1 x Per Day/30 Days Discharge Instructions: Secure with stretch gauze as directed. Wound #5 - [name] Fifth oe Wound Laterality: Right Cleanser: Wound Cleanser (Home Health) 1 x Per Day/30 Days Discharge Instructions: Cleanse the wound with wound cleanser prior to applying a clean dressing using gauze sponges, not tissue or cotton balls. Prim Dressing: Betadine 1 x Per Day/30 Days ary Discharge Instructions: apply betadine to wound. Secondary Dressing: Woven Gauze Sponges 2x2 in (Home Health) 1 x Per Day/30 Days Discharge Instructions: Apply over primary dressing as directed. Secured With: Child psychotherapist, Sterile 2x75 (in/in) (Home Health) 1 x Per Day/30 Days Discharge Instructions: Secure with stretch gauze as directed. Wound #6 - [name] Fourth oe Wound Laterality: Right Cleanser: Wound Cleanser (Home Health) 1 x Per Day/30 Days Discharge Instructions: Cleanse the wound with wound cleanser prior to applying a clean dressing using gauze sponges, not tissue or cotton balls. Prim Dressing: Betadine 1 x Per Day/30 Days ary Discharge Instructions: apply betadine to wound. Secondary Dressing: Woven Gauze Sponges 2x2 in (Home Health) 1 x Per Day/30 Days Discharge Instructions: Apply over primary dressing as directed. Secured With: Child psychotherapist, Sterile 2x75 (in/in) (Home Health) 1 x Per Day/30 Days Discharge Instructions: Secure with stretch gauze as directed. Electronic Signature(s) Signed: 04/11/2021 4:22:23 PM By: Kalman Shan DO Entered By: Kalman Shan on 04/11/2021  16:19:26 -------------------------------------------------------------------------------- Problem List Details Patient Name: Date of Service: [name]. 04/11/2021 2:30 PM Medical Record Number: [id number] Patient Account Number: [id number] Date of Birth/Sex: Treating RN: 1945-11-30 (75 y.o. [name], Lauren Primary Care Provider: [name] Other Clinician: Referring Provider: Treating Provider/Extender: Theodosia Quay in Treatment: 36 Active Problems ICD-10 Encounter Code Description Active Date MDM Diagnosis L97.528 Non-pressure chronic ulcer of other part of left foot with other specified 08/16/2020 No Yes severity L97.514 Non-pressure chronic ulcer of other part of right foot with necrosis of bone 10/18/2020 No Yes E11.51 Type 2 diabetes mellitus with diabetic peripheral angiopathy without gangrene 10/18/2020 No Yes Inactive Problems ICD-10 Code Description Active Date Inactive Date L89.611 Pressure ulcer of right heel, stage 1 08/16/2020 08/16/2020 L89.312 Pressure ulcer of right buttock, stage 2 09/13/2020 09/13/2020 Resolved Problems ICD-10 Code Description Active Date Resolved Date L02.31 Cutaneous abscess of buttock 07/27/2020 07/27/2020 S31.829D Unspecified open wound of left buttock, subsequent encounter 07/27/2020 07/27/2020 Electronic Signature(s) Signed: 04/11/2021 4:22:23 PM By: Kalman Shan DO Signed: 04/11/2021 4:22:23 PM By: Kalman Shan DO Entered By: Kalman Shan on 04/11/2021 16:17:03 -------------------------------------------------------------------------------- Progress Note Details Patient Name: Date of Service: [name]. 04/11/2021  2:30 PM Medical Record Number: [id number] Patient Account Number: [id number] Date of Birth/Sex: Treating RN: 1946/10/03 (75 y.o. [name], Lauren Primary Care Provider: [name] Other Clinician: Referring Provider: Treating Provider/Extender: Theodosia Quay in Treatment: 36 Subjective Chief Complaint Information obtained from Patient Bilateral toe wounds History of Present Illness (HPI) ADMISSION 07/27/2020 This is a 75 year old man who is accompanied by his wife (ex). Nevertheless she is his active caregiver. His most complicated features are remote CVA that left him with a left hemiparesis. He also has widely metastatic prostate cancer to bone which I think is contributed to increasing frailty this year and he is no longer ambulatory. He required hospitalization from 9/7 through 9/14 with respiratory failure seizures. During this time it was noted that he had an open area in the left buttock in close proximity to the gluteal cleft. His wife tells me that he has a history of perirectal abscesses. And she describes this area as starting as a painful swelling in late August certainly sounds like an abscess. When he is in the hospital he required bedside IandD's but did not go to the OR. Has CT scan of the area showed soft tissue thickening in the perianal ulcer. At that point it was concerning for a fistula connection however a discrete abscess was not seen. Notable that he was hypoalbuminemic in the hospital but actually came out better with an albumin of 3.1 on 9/14 his wife says he is eating well. They are currently treating this with Santyl ointment and a backing wet-to-dry dressing. By description of his wife this is done quite nicely and there is certainly less debris over the wound surface. They already have a hospital bed with a level 3 pressure relief surface. The patient lies on his back when he is in bed especially at night although his wife is counseled him not to do this. They are starting sliding board transfers into his wheelchair I think with physical therapy. He has a Foley catheter in place Past medical history includes remote CVA, metastatic prostate cancer to large areas of his thoracic and lumbar  spine and pelvis., Bilateral carotid artery stenosis, Parkinson's disease, type 2 diabetes, diastolic heart failure, CHF, hypertension and a recently discovered possible pancreatic mass in the head of the pancreas 08/16/2020 patient I admitted to the clinic 2-1/2 weeks ago. He had an abscess site on his left buttock that required an IandD while he was in the hospital in September. He has been using Santyl backing wet-to-dry. He is cared for her aerobically at home by his wife 2 concerning areas on his feet. A stage I pressure injury on the right heel this does not have an open wound. He also has on the lateral aspect of the left fourth toe a dry circular eschared wound. 12/2; monthly follow-up. Is a very disabled man who had an abscess in his left buttock and required an IandD. We have been using Santyl to this area I changed to silver collagen last time he is here and this actually looks quite a bit better ooThe last time he was here his wife showed me an area on the left lateral fourth toe I think a pressure injury with his fifth toe. Been using Santyl here as well but not making much improvement ooAs well she shows me he has an area on his right buttock which is very tiny pinpoint area but under illumination still obviously an open area. I wonder if this is more of  a shear injury and transferring and a pressure injury per se 12/16; this is a very disabled man who has a left buttock area that required an IandD apparently an abscess. Since he has come here that he developed a area on the right buttock. His wife says that this is draining pus. Finally he has an area on the lateral part of the left fourth toe. We have been using silver collagen on the buttock and Iodoflex on the fourth toe 1/6; this is a disabled man we have been working on a left buttock area that was initially an IandD abscess injury. We have got this down to a small area in fact it appears to be epithelializing towards the base  of the wound. When he was here last time he had a small draining area on the right buttock. I thought this might be a small cyst culture of the drainage was negative. I do not think this was an abscess at all While he has been here he has been developing areas on the toes on his left foot. He initially had one on the left fourth toe and then last time the right first toe. He is a type II diabetic. With he has a history of coronary artery disease. He complains of a lot of pain in the right foot 1/13; we have been working on areas on his left buttock and then on the right buttock. Both of these are healed today albeit the area on the left has some depth with skin is gone into a divot. Nevertheless I think this is a reasonable outcome. He arrived a week ago with ischemic looking wounds on his right fifth, right first and his left fourth toe. He went on to have arterial studies which we received today these showed an ABI that was noncompressible on both sides however the great toe on the right had a TBI of 0.14 with a pressure of only 17. Monophasic waveforms on all the tibial vessels and including the popliteal on the left he had monophasic waveforms again noncompressible vessels and no recorded toe pressure. Clearly these are ischemic wounds. Fortunately he is not complaining of a lot of pain The patient has a complicated medical situation which includes widely metastatic prostate cancer to bone for which she is on palliative care he also has Parkinson's disease. I have a recent note from his neurologist at which time he asked whether they wish to de-escalate the Parkinson's regimen and the answer was no. The patient has a follow-up appointment with Dr. Gwenlyn Found on 1/28. I believe he is the patient's cardiologist but this appointment was made because of the vascular studies I think predominantly through Dr. Kennon Holter office. 2/10; patient went back to see Dr. Gwenlyn Found. He did not feel given his comorbidities  that he was a candidate for endovascular therapy. His wife seems to agree with that. He has ischemic wounds on his right toes particularly the right fifth toe fourth toe and first toe. He is not in a lot of pain unless these are manipulated 12/20/2020 on evaluation today patient appears to be doing well with regard to his feet all things considered. There does not appear to be any obvious signs of infection here. Most of the wounds are actually for the most part eschar and dry. With that being said the patient is not actually a candidate for revascularization according to Dr. Gwenlyn Found who he has seen previous. Nonetheless I think that for the time being Betadine or something of  that sort just keep things clean and dry will be a good way to go. 3/24; patient has 3 areas on the dorsal right toes first fourth and fifth that are necrotic probably ischemic. He was not felt to be a candidate for revascularization therapy. He also has an area on the left fourth toe in a similar situation. His significant other is concerned about possibility of an abscess on his buttock. He was put on antibiotics last time. I must say that when he was here before he seemed to have recurrent small abscesses but the purulence in this culture is negative I wondered about an epidermoid cyst although this area currently is not in the same spot 4/21; patient presents for his 1 month follow-up. He has chronic necrotic right toes. The affected toes include the first fourth and fifth digits. The third toe wound has closed. He uses Betadine on this daily. He has no complaints today. He denies fever chills, erythema to the wound sites. 5/19; patient presents for 1 month follow-up. He has had no issues in the past month with his bilateral toe wounds due to arterial insufficiency. He continues to use Betadine on these daily. He has no complaints today. He denies signs of infection. 6/30; patient presents for 6-week follow-up. He denies any  issues. He has been using Betadine to the areas of necrosis on the right toes. He denies signs of infection. Patient History Information obtained from Patient. Family History Unknown History. Social History Former smoker - quit 12 years ago, Marital Status - Divorced, Alcohol Use - Never, Drug Use - No History, Caffeine Use - Rarely. Medical History Cardiovascular Patient has history of Congestive Heart Failure, Coronary Artery Disease, Hypertension Endocrine Patient has history of Type II Diabetes Denies history of Type I Diabetes Neurologic Patient has history of Seizure Disorder Oncologic Denies history of Received Chemotherapy, Received Radiation Medical A Surgical History Notes nd Cardiovascular CVA, Carotid stenosis Genitourinary CKD stage 3 Neurologic Parkinson's, left hemiparesis, CVA Oncologic prostate cancer with mets to bone, bladder cancer Objective Constitutional respirations regular, non-labored and within target range for patient.. Vitals Time Taken: 3:07 PM, Height: 64 in, Weight: 150 lbs, BMI: 25.7, Temperature: 98.2 F, Pulse: 59 bpm, Respiratory Rate: 16 breaths/min, Blood Pressure: 121/70 mmHg, Capillary Blood Glucose: 101 mg/dl. General Notes: glucose per pt report Psychiatric pleasant and cooperative. General Notes: Right foot: He has eschar to the dorsal first fourth and fifth toes. There are no signs of infection. Foot and leg are warm. Integumentary (Hair, Skin) Wound #4 status is Open. Original cause of wound was Not Known. The date acquired was: 10/11/2020. The wound has been in treatment 25 weeks. The wound is located on the Right [name] Great. The wound measures 1.2cm length x 1cm width x 0.1cm depth; 0.942cm^2 area and 0.094cm^3 volume. There is bone and oe Fat Layer (Subcutaneous Tissue) exposed. There is no tunneling or undermining noted. There is a none present amount of drainage noted. The wound margin is flat and intact. There is no granulation  within the wound bed. There is a large (67-100%) amount of necrotic tissue within the wound bed including Eschar. Wound #5 status is Open. Original cause of wound was Not Known. The date acquired was: 10/11/2020. The wound has been in treatment 25 weeks. The wound is located on the Right [name] [address]. The wound measures 1cm length x 0.5cm width x 0.1cm depth; 0.393cm^2 area and 0.039cm^3 volume. There is Fat Layer oe (Subcutaneous Tissue) exposed. There is no tunneling  or undermining noted. There is a none present amount of drainage noted. The wound margin is distinct with the outline attached to the wound base. There is no granulation within the wound bed. There is a large (67-100%) amount of necrotic tissue within the wound bed including Eschar. Wound #6 status is Open. Original cause of wound was Gradually Appeared. The date acquired was: 11/22/2020. The wound has been in treatment 20 weeks. The wound is located on the Right [name] [address]. The wound measures 1cm length x 1.4cm width x 0.1cm depth; 1.1cm^2 area and 0.11cm^3 volume. There is Fat oe Layer (Subcutaneous Tissue) exposed. There is no tunneling or undermining noted. There is a small amount of serosanguineous drainage noted. The wound margin is distinct with the outline attached to the wound base. There is no granulation within the wound bed. There is a large (67-100%) amount of necrotic tissue within the wound bed including Eschar. 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 There are no signs of infection on exam. Overall patient is doing well. He would like to follow-up in 8 weeks and I think this is reasonable. He knows to call with any questions or concerns and can  be seen sooner. Plan Follow-up Appointments: Return Appointment in: - 8 weeks out Edema Control - Lymphedema / SCD / Other: Moisturize legs daily. Off-Loading: Low air-loss mattress (Group 2) - CONTINUE TO USE. Turn and reposition every 2 hours Other: - CONTINUE TO USE BUNNY BOOTS FOR HEEL PROTECTION. UP IN WHEELCHAIR NO MORE THAT 2 HOUR INCREMENTS. Ensure to relieve pressure off buttock closed areas to prevent reopening. Home Health: No change in wound care orders this week; continue Home Health for wound care. May utilize formulary equivalent dressing for wound treatment orders unless otherwise specified. - [facility] weekly. WOUND #4: - [name] Great Wound Laterality: Right oe Cleanser: Wound Cleanser (Home Health) 1 x Per Day/30 Days Discharge Instructions: Cleanse the wound with wound cleanser prior to applying a clean dressing using gauze sponges, not tissue or cotton balls. Prim Dressing: Betadine 1 x Per Day/30 Days ary Discharge Instructions: apply betadine to wound. Secondary Dressing: Woven Gauze Sponges 2x2 in (Home Health) 1 x Per Day/30 Days Discharge Instructions: Apply over primary dressing as directed. Secured With: Child psychotherapist, Sterile 2x75 (in/in) (Home Health) 1 x Per Day/30 Days Discharge Instructions: Secure with stretch gauze as directed. WOUND #5: - [name] Fifth Wound Laterality: Right oe Cleanser: Wound Cleanser (Home Health) 1 x Per Day/30 Days Discharge Instructions: Cleanse the wound with wound cleanser prior to applying a clean dressing using gauze sponges, not tissue or cotton balls. Prim Dressing: Betadine 1 x Per Day/30 Days ary Discharge Instructions: apply betadine to wound. Secondary Dressing: Woven Gauze Sponges 2x2 in (Home Health) 1 x Per Day/30 Days Discharge Instructions: Apply over primary dressing as directed. Secured With: Child psychotherapist, Sterile 2x75 (in/in) (Home Health) 1 x Per Day/30  Days Discharge Instructions: Secure with stretch gauze as directed. WOUND #6: - [name] Fourth Wound Laterality: Right oe Cleanser: Wound Cleanser (Home Health) 1 x Per Day/30 Days Discharge Instructions: Cleanse the wound with wound cleanser prior to applying a clean dressing using gauze sponges, not tissue or cotton balls. Prim Dressing: Betadine 1 x  Per Day/30 Days ary Discharge Instructions: apply betadine to wound. Secondary Dressing: Woven Gauze Sponges 2x2 in (Home Health) 1 x Per Day/30 Days Discharge Instructions: Apply over primary dressing as directed. Secured With: Child psychotherapist, Sterile 2x75 (in/in) (Home Health) 1 x Per Day/30 Days Discharge Instructions: Secure with stretch gauze as directed. 1. Continue Betadine to the eschar regions on the right toes 2. Follow-up in 8 weeks Electronic Signature(s) Signed: 04/11/2021 4:22:23 PM By: Kalman Shan DO Entered By: Kalman Shan on 04/11/2021 16:20:41 -------------------------------------------------------------------------------- HxROS Details Patient Name: Date of Service: [name]. 04/11/2021 2:30 PM Medical Record Number: [id number] Patient Account Number: [id number] Date of Birth/Sex: Treating RN: 07-15-1946 (75 y.o. [name], Lauren Primary Care Provider: [name] Other Clinician: Referring Provider: Treating Provider/Extender: Theodosia Quay in Treatment: 36 Information Obtained From Patient Cardiovascular Medical History: Positive for: Congestive Heart Failure; Coronary Artery Disease; Hypertension Past Medical History Notes: CVA, Carotid stenosis Endocrine Medical History: Positive for: Type II Diabetes Negative for: Type I Diabetes Time with diabetes: 5 years ago Treated with: Insulin Blood sugar tested every day: Yes Tested : once a day Genitourinary Medical History: Past Medical History Notes: CKD stage 3 Neurologic Medical  History: Positive for: Seizure Disorder Past Medical History Notes: Parkinson's, left hemiparesis, CVA Oncologic Medical History: Negative for: Received Chemotherapy; Received Radiation Past Medical History Notes: prostate cancer with mets to bone, bladder cancer Immunizations Pneumococcal Vaccine: Received Pneumococcal Vaccination: Yes Implantable Devices None Family and Social History Unknown History: Yes; Former smoker - quit 12 years ago; Marital Status - Divorced; Alcohol Use: Never; Drug Use: No History; Caffeine Use: Rarely; Financial Concerns: No; Food, Clothing or Shelter Needs: No; Support System Lacking: No; Transportation Concerns: No Electronic Signature(s) Signed: 04/11/2021 4:22:23 PM By: Kalman Shan DO Signed: 04/11/2021 5:32:03 PM By: Rhae Hammock RN Entered By: Kalman Shan on 04/11/2021 16:18:29 -------------------------------------------------------------------------------- SuperBill Details Patient Name: Date of Service: Alycia Patten [name]. 04/11/2021 Medical Record Number: [id number] Patient Account Number: [id number] Date of Birth/Sex: Treating RN: [birth year] (75 y.o. [name], Lauren Primary Care Provider: [name] Other Clinician: Referring Provider: Treating Provider/Extender: Theodosia Quay in Treatment: 36 Diagnosis Coding ICD-10 Codes Code Description [phone] Non-pressure chronic ulcer of other part of right foot with necrosis of bone E11.51 Type 2 diabetes mellitus with diabetic peripheral angiopathy without gangrene Facility Procedures CPT4 Code: [id number] Description: 99214 - WOUND CARE VISIT-LEV 4 EST PT Modifier: Quantity: 1 Physician Procedures : CPT4 Code Description Modifier [id number] - WC PHYS LEVEL 3 - EST PT ICD-10 Diagnosis Description L97.514 Non-pressure chronic ulcer of other part of right foot with necrosis of bone E11.51 Type 2 diabetes mellitus with diabetic peripheral   angiopathy without gangrene Quantity: 1 Electronic Signature(s) Signed: 04/11/2021 4:22:23 PM By: Kalman Shan DO Entered By: Kalman Shan on 04/11/2021 16:21:57

## 2021-04-11 NOTE — Telephone Encounter (Signed)
Rx(s) sent to pharmacy electronically.  

## 2021-04-16 DIAGNOSIS — I5033 Acute on chronic diastolic (congestive) heart failure: Secondary | ICD-10-CM | POA: Diagnosis not present

## 2021-04-16 DIAGNOSIS — Z794 Long term (current) use of insulin: Secondary | ICD-10-CM | POA: Diagnosis not present

## 2021-04-16 DIAGNOSIS — I69354 Hemiplegia and hemiparesis following cerebral infarction affecting left non-dominant side: Secondary | ICD-10-CM | POA: Diagnosis not present

## 2021-04-16 DIAGNOSIS — S90411D Abrasion, right great toe, subsequent encounter: Secondary | ICD-10-CM | POA: Diagnosis not present

## 2021-04-16 DIAGNOSIS — Z466 Encounter for fitting and adjustment of urinary device: Secondary | ICD-10-CM | POA: Diagnosis not present

## 2021-04-16 DIAGNOSIS — I130 Hypertensive heart and chronic kidney disease with heart failure and stage 1 through stage 4 chronic kidney disease, or unspecified chronic kidney disease: Secondary | ICD-10-CM | POA: Diagnosis not present

## 2021-04-16 DIAGNOSIS — N1832 Chronic kidney disease, stage 3b: Secondary | ICD-10-CM | POA: Diagnosis not present

## 2021-04-16 DIAGNOSIS — L89892 Pressure ulcer of other site, stage 2: Secondary | ICD-10-CM | POA: Diagnosis not present

## 2021-04-16 DIAGNOSIS — G20 Parkinson's disease: Secondary | ICD-10-CM | POA: Diagnosis not present

## 2021-04-16 DIAGNOSIS — E1122 Type 2 diabetes mellitus with diabetic chronic kidney disease: Secondary | ICD-10-CM | POA: Diagnosis not present

## 2021-04-16 NOTE — Progress Notes (Signed)
Luis, OSMUNDSON [name] ([id number]) . Visit Report for 04/11/2021 Arrival Information Details Patient Name: Date of Service: [name], [name] [facility] [name]. 04/11/2021 2:30 PM Medical Record Number: [id number] Patient Account Number: [id number] Date of Birth/Sex: Treating RN: 1946-07-18 (75 y.o. [name] Primary Care Ali Mohl: [name] Other Clinician: Referring Toyna Erisman: Treating Hendricks Schwandt/Extender: Theodosia Quay in Treatment: 40 Visit Information History Since Last Visit Added or deleted any medications: No Patient Arrived: Wheel Chair Any new allergies or adverse reactions: No Arrival Time: 15:07 Had a fall or experienced change in No Accompanied By: spouse activities of daily living that may affect Transfer Assistance: None risk of falls: Patient Identification Verified: Yes Signs or symptoms of abuse/neglect since last visito No Secondary Verification Process Completed: Yes Hospitalized since last visit: No Patient Requires Transmission-Based Precautions: No Implantable device outside of the clinic excluding No Patient Has Alerts: Yes cellular tissue based products placed in the center Patient Alerts: Patient on Blood Thinner since last visit: Has Dressing in Place as Prescribed: Yes Pain Present Now: No Electronic Signature(s) Signed: 04/16/2021 7:10:28 PM By: Levan Hurst RN, BSN Entered By: Levan Hurst on 04/11/2021 15:07:52 -------------------------------------------------------------------------------- Clinic Level of Care Assessment Details Patient Name: Date of Service: [name] HN [name]. 04/11/2021 2:30 PM Medical Record Number: [id number] Patient Account Number: [id number] Date of Birth/Sex: Treating RN: [birth year] (75 y.o. [name], Lauren Primary Care Devina Bezold: [name] Other Clinician: Referring Enas Winchel: Treating Ernst Cumpston/Extender: Theodosia Quay in Treatment: 36 Clinic Level of Care Assessment  Items TOOL 4 Quantity Score X- 1 0 Use when only an EandM is performed on FOLLOW-UP visit ASSESSMENTS - Nursing Assessment / Reassessment X- 1 10 Reassessment of Co-morbidities (includes updates in patient status) X- 1 5 Reassessment of Adherence to Treatment Plan ASSESSMENTS - Wound and Skin A ssessment / Reassessment X - Simple Wound Assessment / Reassessment - one wound 1 5 _0  - 0 Complex Wound Assessment / Reassessment - multiple wounds X- 1 10 Dermatologic / Skin Assessment (not related to wound area) ASSESSMENTS - Focused Assessment _1  - 0 Circumferential Edema Measurements - multi extremities _2  - 0 Nutritional Assessment / Counseling / Intervention _3  - 0 Lower Extremity Assessment (monofilament, tuning fork, pulses) _4  - 0 Peripheral Arterial Disease Assessment (using hand held doppler) ASSESSMENTS - Ostomy and/or Continence Assessment and Care _5  - 0 Incontinence Assessment and Management _6  - 0 Ostomy Care Assessment and Management (repouching, etc.) PROCESS - Coordination of Care X - Simple Patient / Family Education for ongoing care 1 15 _7  - 0 Complex (extensive) Patient / Family Education for ongoing care X- 1 10 Staff obtains Programmer, systems, Records, [name] Results / Process Orders est _8  - 0 Staff telephones HHA, Nursing Homes / Clarify orders / etc _9  - 0 Routine Transfer to another Facility (non-emergent condition) _10  - 0 Routine Hospital Admission (non-emergent condition) _11  - 0 New Admissions / Biomedical engineer / Ordering NPWT Apligraf, etc. , _12  - 0 Emergency Hospital Admission (emergent condition) X- 1 10 Simple Discharge Coordination _13  - 0 Complex (extensive) Discharge Coordination PROCESS - Special Needs _14  - 0 Pediatric / Minor Patient Management _15  - 0 Isolation Patient Management _16  - 0 Hearing / Language / Visual special needs _17  - 0 Assessment of Community assistance (transportation, D/C planning, etc.) _18  - 0 Additional  assistance / Altered mentation _19  - 0 Support Surface(s) Assessment (bed, cushion, seat, etc.) INTERVENTIONS - Wound Cleansing / Measurement _20  - 0 Simple Wound Cleansing - one wound X- 2  5 Complex Wound Cleansing - multiple wounds X- 1 5 Wound Imaging (photographs - any number of wounds) _0  - 0 Wound Tracing (instead of photographs) _1  - 0 Simple Wound Measurement - one wound X- 2 5 Complex Wound Measurement - multiple wounds INTERVENTIONS - Wound Dressings _2  - 0 Small Wound Dressing one or multiple wounds X- 2 15 Medium Wound Dressing one or multiple wounds _3  - 0 Large Wound Dressing one or multiple wounds <ZOXWRUEAVWUJWJXB>_1<\/YNWGNFAOZHYQMVHQ>_4  - 0 Application of Medications - topical <ONGEXBMWUXLKGMWN>_0<\/UVOZDGUYQIHKVQQV>_9  - 0 Application of Medications - injection INTERVENTIONS - Miscellaneous _6  - 0 External ear exam _7  - 0 Specimen Collection (cultures, biopsies, blood, body fluids, etc.) _8  - 0 Specimen(s) / Culture(s) sent or taken to Lab for analysis _9  - 0 Patient Transfer (multiple staff / Civil Service fast streamer / Similar devices) _10  - 0 Simple Staple / Suture removal (25 or less) _11  - 0 Complex Staple / Suture removal (26 or more) _12  - 0 Hypo / Hyperglycemic Management (close monitor of Blood Glucose) _13  - 0 Ankle / Brachial Index (ABI) - do not check if billed separately X- 1 5 Vital Signs Has the patient been seen at the hospital within the last three years: Yes Total Score: 125 Level Of Care: New/Established - Level 4 Electronic Signature(s) Signed: 04/11/2021 5:32:03 PM By: Rhae Hammock RN Entered By: Rhae Hammock on 04/11/2021 15:41:03 -------------------------------------------------------------------------------- Lower Extremity Assessment Details Patient Name: Date of Service: [name] HN [name]. 04/11/2021 2:30 PM Medical Record Number: [id number] Patient Account Number: [id number] Date of Birth/Sex: Treating RN: 10-04-1946 (75 y.o. [name] Primary Care Alisia Vanengen: [name] Other  Clinician: Referring Katielynn Horan: Treating Eryck Negron/Extender: Edgardo Roys Weeks in Treatment: 36 Edema Assessment Assessed: Shirlyn Goltz: No] Patrice Paradise: No] Edema: [Left: N] [Right: o] Calf Left: Right: Point of Measurement: 27 cm From Medial Instep 23 cm 25 cm Ankle Left: Right: Point of Measurement: 9 cm From Medial Instep 18 cm 17 cm Vascular Assessment Pulses: Dorsalis Pedis Palpable: [Left:Yes] [Right:Yes] Electronic Signature(s) Signed: 04/16/2021 7:10:28 PM By: Levan Hurst RN, BSN Entered By: Levan Hurst on 04/11/2021 15:08:45 -------------------------------------------------------------------------------- Multi Wound Chart Details Patient Name: Date of Service: [name] HN [name]. 04/11/2021 2:30 PM Medical Record Number: [id number] Patient Account Number: [id number] Date of Birth/Sex: Treating RN: [birth year] (75 y.o. [name], Lauren Primary Care Marivel Mcclarty: [name] Other Clinician: Referring Jishnu Jenniges: Treating Kani Jobson/Extender: Theodosia Quay in Treatment: 83 Vital Signs Height(in): 64 Capillary Blood Glucose(mg/dl): 101 Weight(lbs): 150 Pulse(bpm): 8 Body Mass Index(BMI): 26 Blood Pressure(mmHg): 121/70 Temperature(F): 98.2 Respiratory Rate(breaths/min): 16 Photos: [4:No Photos Right [name] Great oe] [5:No Photos Right [name] Fifth oe] [6:No Photos Right [name] Fourth oe] Wound Location: [4:Not Known] [5:Not Known] [6:Gradually Appeared] Wounding Event: [4:Diabetic Wound/Ulcer of the Lower] [5:Diabetic Wound/Ulcer of the Lower] [6:Diabetic Wound/Ulcer of the Lower] Primary Etiology: [4:Extremity Congestive Heart Failure, Coronary Congestive Heart Failure, Coronary Congestive Heart Failure, Coronary] [5:Extremity] [6:Extremity] Comorbid History: [4:Artery Disease, Hypertension, Type II Artery Disease, Hypertension, Type II Artery Disease, Hypertension, Type II Diabetes, Seizure Disorder 10/11/2020] [5:Diabetes, Seizure Disorder  10/11/2020] [6:Diabetes, Seizure Disorder  11/22/2020] Date Acquired: [4:25] [5:25] [6:20] Weeks of Treatment: [4:Open] [5:Open] [6:Open] Wound Status: [4:1.2x1x0.1] [5:1x0.5x0.1] [6:1x1.4x0.1] Measurements L x W x D (cm) [4:0.942] [5:0.393] [6:1.1] A (cm) : rea [4:0.094] [5:0.039] [6:0.11] Volume (cm) : [4:14.40%] [5:0.00%] [6:-133.50%] % Reduction in A rea: [4:14.50%] [5:0.00%] [6:-134.00%] % Reduction in Volume: [4:Grade 2] [5:Grade 2] [6:Grade 2] Classification: [4:None Present] [5:None Present] [6:Small] Exudate A mount: [4:N/A] [5:N/A] [6:Serosanguineous] Exudate  Type: [4:N/A] [5:N/A] [6:red, brown] Exudate Color: [4:Flat and Intact] [5:Distinct, outline attached] [6:Distinct, outline attached] Wound Margin: [4:None Present (0%)] [5:None Present (0%)] [6:None Present (0%)] Granulation A mount: [4:Large (67-100%)] [5:Large (67-100%)] [6:Large (67-100%)] Necrotic A mount: [4:Eschar] [5:Eschar] [6:Eschar] Necrotic Tissue: [4:Fat Layer (Subcutaneous Tissue): Yes Fat Layer (Subcutaneous Tissue): Yes Fat Layer (Subcutaneous Tissue): Yes] Exposed Structures: [4:Bone: Yes Fascia: No Tendon: No Muscle: No Joint: No Small (1-33%)] [5:Fascia: No Tendon: No Muscle: No Joint: No Bone: No None] [6:Fascia: No Tendon: No Muscle: No Joint: No Bone: No None] Treatment Notes Electronic Signature(s) Signed: 04/11/2021 4:22:23 PM By: Kalman Shan DO Signed: 04/11/2021 5:32:03 PM By: Rhae Hammock RN Entered By: Kalman Shan on 04/11/2021 16:17:08 -------------------------------------------------------------------------------- Multi-Disciplinary Care Plan Details Patient Name: Date of Service: [name] HN [name]. 04/11/2021 2:30 PM Medical Record Number: [id number] Patient Account Number: [id number] Date of Birth/Sex: Treating RN: [birth year] (75 y.o. [name], Lauren Primary Care Yaeko Fazekas: [name] Other Clinician: Referring Zhara Gieske: Treating Jack Mineau/Extender: Theodosia Quay in Treatment: [place] reviewed with physician Active Inactive Wound/Skin Impairment Nursing Diagnoses: Impaired tissue integrity Knowledge deficit related to ulceration/compromised skin integrity Goals: Patient/caregiver will verbalize understanding of skin care regimen Date Initiated: 07/27/2020 Target Resolution Date: 04/26/2021 Goal Status: Active Ulcer/skin breakdown will have a volume reduction of 30% by week 4 Date Initiated: 07/27/2020 Date Inactivated: 09/13/2020 Target Resolution Date: 08/24/2020 Goal Status: Met Interventions: Assess patient/caregiver ability to obtain necessary supplies Assess patient/caregiver ability to perform ulcer/skin care regimen upon admission and as needed Assess ulceration(s) every visit Provide education on ulcer and skin care Treatment Activities: Skin care regimen initiated : 07/27/2020 Topical wound management initiated : 07/27/2020 Notes: Electronic Signature(s) Signed: 04/11/2021 5:32:03 PM By: Rhae Hammock RN Entered By: Rhae Hammock on 04/11/2021 15:50:10 -------------------------------------------------------------------------------- Pain Assessment Details Patient Name: Date of Service: [name] HN [name]. 04/11/2021 2:30 PM Medical Record Number: [id number] Patient Account Number: [id number] Date of Birth/Sex: Treating RN: 05-29-1946 (75 y.o. [name] Primary Care Silvina Hackleman: [name] Other Clinician: Referring Caidyn Henricksen: Treating Jeweline Reif/Extender: Theodosia Quay in Treatment: 36 Active Problems Location of Pain Severity and Description of Pain Patient Has Paino No Site Locations Pain Management and Medication Current Pain Management: Electronic Signature(s) Signed: 04/16/2021 7:10:28 PM By: Levan Hurst RN, BSN Entered By: Levan Hurst on 04/11/2021  15:08:35 -------------------------------------------------------------------------------- Patient/Caregiver Education Details Patient Name: Date of Service: Julieta Gutting 6/30/2022andnbsp2:30 PM Medical Record Number: [id number] Patient Account Number: [id number] Date of Birth/Gender: Treating RN: Jan 04, 1946 (75 y.o. Erie Noe Primary Care Physician: [name] Other Clinician: Referring Physician: Treating Physician/Extender: Theodosia Quay in Treatment: 22 Education Assessment Education Provided To: Patient Education Topics Provided Wound/Skin Impairment: Methods: Explain/Verbal Responses: State content correctly [employer]) Signed: 04/11/2021 5:32:03 PM By: Rhae Hammock RN Entered By: Rhae Hammock on 04/11/2021 15:50:21 -------------------------------------------------------------------------------- Wound Assessment Details Patient Name: Date of Service: Alycia Patten [name]. 04/11/2021 2:30 PM Medical Record Number: [id number] Patient Account Number: [id number] Date of Birth/Sex: Treating RN: [birth year] (75 y.o. [name] Primary Care Minah Axelrod: [name] Other Clinician: Referring Katheren Jimmerson: Treating [name] Spells/Extender: Edgardo Roys Weeks in Treatment: 36 Wound Status Wound Number: 4 Primary Diabetic Wound/Ulcer of the Lower Extremity Etiology: Wound Location: Right [name] Great oe Wound Open Wounding Event: Not Known Status: Date Acquired: 10/11/2020 Comorbid Congestive Heart Failure, Coronary Artery Disease, Weeks Of Treatment: 25 History: Hypertension, Type II Diabetes, Seizure Disorder Clustered Wound: No Photos Photo Uploaded By: Rudean Curt,  Destiny on 04/11/2021 17:23:26 Wound Measurements Length: (cm) 1.2 Width: (cm) 1 Depth: (cm) 0.1 Area: (cm) 0.942 Volume: (cm) 0.094 % Reduction in Area: 14.4% % Reduction in Volume: 14.5% Epithelialization: Small  (1-33%) Tunneling: No Undermining: No Wound Description Classification: Grade 2 Wound Margin: Flat and Intact Exudate Amount: None Present Foul Odor After Cleansing: No Slough/Fibrino Yes Wound Bed Granulation Amount: None Present (0%) Exposed Structure Necrotic Amount: Large (67-100%) Fascia Exposed: No Necrotic Quality: Eschar Fat Layer (Subcutaneous Tissue) Exposed: Yes Tendon Exposed: No Muscle Exposed: No Joint Exposed: No Bone Exposed: Yes Electronic Signature(s) Signed: 04/16/2021 7:10:28 PM By: Levan Hurst RN, BSN Entered By: Levan Hurst on 04/11/2021 15:09:16 -------------------------------------------------------------------------------- Wound Assessment Details Patient Name: Date of Service: [name] HN [name]. 04/11/2021 2:30 PM Medical Record Number: [id number] Patient Account Number: [id number] Date of Birth/Sex: Treating RN: 1945/12/24 (75 y.o. [name] Primary Care Derek Huneycutt: [name] Other Clinician: Referring Cyris Maalouf: Treating Suzette Flagler/Extender: Theodosia Quay in Treatment: 36 Wound Status Wound Number: 5 Primary Diabetic Wound/Ulcer of the Lower Extremity Etiology: Wound Location: Right [name] Fifth oe Wound Open Wounding Event: Not Known Status: Date Acquired: 10/11/2020 Comorbid Congestive Heart Failure, Coronary Artery Disease, Weeks Of Treatment: 25 History: Hypertension, Type II Diabetes, Seizure Disorder Clustered Wound: No Photos Photo Uploaded By: Sandre Kitty on 04/11/2021 17:23:48 Wound Measurements Length: (cm) 1 Width: (cm) 0.5 Depth: (cm) 0.1 Area: (cm) 0.393 Volume: (cm) 0.039 % Reduction in Area: 0% % Reduction in Volume: 0% Epithelialization: None Tunneling: No Undermining: No Wound Description Classification: Grade 2 Wound Margin: Distinct, outline attached Exudate Amount: None Present Foul Odor After Cleansing: No Slough/Fibrino No Wound Bed Granulation Amount: None  Present (0%) Exposed Structure Necrotic Amount: Large (67-100%) Fascia Exposed: No Necrotic Quality: Eschar Fat Layer (Subcutaneous Tissue) Exposed: Yes Tendon Exposed: No Muscle Exposed: No Joint Exposed: No Bone Exposed: No Electronic Signature(s) Signed: 04/16/2021 7:10:28 PM By: Levan Hurst RN, BSN Entered By: Levan Hurst on 04/11/2021 15:09:25 -------------------------------------------------------------------------------- Wound Assessment Details Patient Name: Date of Service: [name] HN [name]. 04/11/2021 2:30 PM Medical Record Number: [id number] Patient Account Number: [id number] Date of Birth/Sex: Treating RN: [birth year] (75 y.o. [name] Primary Care Thaer Miyoshi: [name] Other Clinician: Referring Raena Pau: Treating Ishia Tenorio/Extender: Theodosia Quay in Treatment: 36 Wound Status Wound Number: 6 Primary Diabetic Wound/Ulcer of the Lower Extremity Etiology: Wound Location: Right [name] Fourth oe Wound Open Wounding Event: Gradually Appeared Status: Date Acquired: 11/22/2020 Comorbid Congestive Heart Failure, Coronary Artery Disease, Weeks Of Treatment: 20 History: Hypertension, Type II Diabetes, Seizure Disorder Clustered Wound: No Photos Photo Uploaded By: Sandre Kitty on 04/11/2021 17:23:27 Wound Measurements Length: (cm) 1 Width: (cm) 1.4 Depth: (cm) 0.1 Area: (cm) 1.1 Volume: (cm) 0.11 % Reduction in Area: -133.5% % Reduction in Volume: -134% Epithelialization: None Tunneling: No Undermining: No Wound Description Classification: Grade 2 Wound Margin: Distinct, outline attached Exudate Amount: Small Exudate Type: Serosanguineous Exudate Color: red, brown Foul Odor After Cleansing: No Slough/Fibrino No Wound Bed Granulation Amount: None Present (0%) Exposed Structure Necrotic Amount: Large (67-100%) Fascia Exposed: No Necrotic Quality: Eschar Fat Layer (Subcutaneous Tissue) Exposed: Yes Tendon Exposed:  No Muscle Exposed: No Joint Exposed: No Bone Exposed: No Electronic Signature(s) Signed: 04/16/2021 7:10:28 PM By: Levan Hurst RN, BSN Entered By: Levan Hurst on 04/11/2021 15:09:37 -------------------------------------------------------------------------------- [place] Details Patient Name: Date of Service: [name] HN [name]. 04/11/2021 2:30 PM Medical Record Number: [id number] Patient Account Number: [id number] Date of Birth/Sex: Treating RN: 1946-07-14 (75 y.o. Jonette Eva,  [place] Primary Care Mischa Brittingham: [name] Other Clinician: Referring Nicklous Aburto: Treating Eshal Propps/Extender: Theodosia Quay in Treatment: 36 Vital Signs Time Taken: 15:07 Temperature (F): 98.2 Height (in): 64 Pulse (bpm): 59 Weight (lbs): 150 Respiratory Rate (breaths/min): 16 Body Mass Index (BMI): 25.7 Blood Pressure (mmHg): 121/70 Capillary Blood Glucose (mg/dl): 101 Reference Range: 80 - 120 mg / dl Notes glucose per pt report Electronic Signature(s) Signed: 04/16/2021 7:10:28 PM By: Levan Hurst RN, BSN Entered By: Levan Hurst on 04/11/2021 15:08:30

## 2021-04-24 DIAGNOSIS — G20 Parkinson's disease: Secondary | ICD-10-CM | POA: Diagnosis not present

## 2021-04-24 DIAGNOSIS — Z794 Long term (current) use of insulin: Secondary | ICD-10-CM | POA: Diagnosis not present

## 2021-04-24 DIAGNOSIS — E1122 Type 2 diabetes mellitus with diabetic chronic kidney disease: Secondary | ICD-10-CM | POA: Diagnosis not present

## 2021-04-24 DIAGNOSIS — Z466 Encounter for fitting and adjustment of urinary device: Secondary | ICD-10-CM | POA: Diagnosis not present

## 2021-04-24 DIAGNOSIS — L89892 Pressure ulcer of other site, stage 2: Secondary | ICD-10-CM | POA: Diagnosis not present

## 2021-04-24 DIAGNOSIS — N1832 Chronic kidney disease, stage 3b: Secondary | ICD-10-CM | POA: Diagnosis not present

## 2021-04-24 DIAGNOSIS — I130 Hypertensive heart and chronic kidney disease with heart failure and stage 1 through stage 4 chronic kidney disease, or unspecified chronic kidney disease: Secondary | ICD-10-CM | POA: Diagnosis not present

## 2021-04-24 DIAGNOSIS — S90411D Abrasion, right great toe, subsequent encounter: Secondary | ICD-10-CM | POA: Diagnosis not present

## 2021-04-24 DIAGNOSIS — I5033 Acute on chronic diastolic (congestive) heart failure: Secondary | ICD-10-CM | POA: Diagnosis not present

## 2021-04-24 DIAGNOSIS — I69354 Hemiplegia and hemiparesis following cerebral infarction affecting left non-dominant side: Secondary | ICD-10-CM | POA: Diagnosis not present

## 2021-04-25 DIAGNOSIS — R339 Retention of urine, unspecified: Secondary | ICD-10-CM | POA: Diagnosis not present

## 2021-04-25 DIAGNOSIS — I69354 Hemiplegia and hemiparesis following cerebral infarction affecting left non-dominant side: Secondary | ICD-10-CM | POA: Diagnosis not present

## 2021-04-25 DIAGNOSIS — I130 Hypertensive heart and chronic kidney disease with heart failure and stage 1 through stage 4 chronic kidney disease, or unspecified chronic kidney disease: Secondary | ICD-10-CM | POA: Diagnosis not present

## 2021-04-25 DIAGNOSIS — R1310 Dysphagia, unspecified: Secondary | ICD-10-CM | POA: Diagnosis not present

## 2021-04-25 DIAGNOSIS — S90411D Abrasion, right great toe, subsequent encounter: Secondary | ICD-10-CM | POA: Diagnosis not present

## 2021-04-25 DIAGNOSIS — Z7984 Long term (current) use of oral hypoglycemic drugs: Secondary | ICD-10-CM | POA: Diagnosis not present

## 2021-04-25 DIAGNOSIS — G20 Parkinson's disease: Secondary | ICD-10-CM | POA: Diagnosis not present

## 2021-04-25 DIAGNOSIS — L89892 Pressure ulcer of other site, stage 2: Secondary | ICD-10-CM | POA: Diagnosis not present

## 2021-04-25 DIAGNOSIS — E1122 Type 2 diabetes mellitus with diabetic chronic kidney disease: Secondary | ICD-10-CM | POA: Diagnosis not present

## 2021-04-25 DIAGNOSIS — I5032 Chronic diastolic (congestive) heart failure: Secondary | ICD-10-CM | POA: Diagnosis not present

## 2021-04-25 DIAGNOSIS — N1832 Chronic kidney disease, stage 3b: Secondary | ICD-10-CM | POA: Diagnosis not present

## 2021-04-29 DIAGNOSIS — G20 Parkinson's disease: Secondary | ICD-10-CM | POA: Diagnosis not present

## 2021-04-29 DIAGNOSIS — S90411D Abrasion, right great toe, subsequent encounter: Secondary | ICD-10-CM | POA: Diagnosis not present

## 2021-04-29 DIAGNOSIS — N1832 Chronic kidney disease, stage 3b: Secondary | ICD-10-CM | POA: Diagnosis not present

## 2021-04-29 DIAGNOSIS — I69354 Hemiplegia and hemiparesis following cerebral infarction affecting left non-dominant side: Secondary | ICD-10-CM | POA: Diagnosis not present

## 2021-04-29 DIAGNOSIS — L89892 Pressure ulcer of other site, stage 2: Secondary | ICD-10-CM | POA: Diagnosis not present

## 2021-04-29 DIAGNOSIS — Z794 Long term (current) use of insulin: Secondary | ICD-10-CM | POA: Diagnosis not present

## 2021-04-29 DIAGNOSIS — E1122 Type 2 diabetes mellitus with diabetic chronic kidney disease: Secondary | ICD-10-CM | POA: Diagnosis not present

## 2021-04-29 DIAGNOSIS — I5032 Chronic diastolic (congestive) heart failure: Secondary | ICD-10-CM | POA: Diagnosis not present

## 2021-04-29 DIAGNOSIS — I130 Hypertensive heart and chronic kidney disease with heart failure and stage 1 through stage 4 chronic kidney disease, or unspecified chronic kidney disease: Secondary | ICD-10-CM | POA: Diagnosis not present

## 2021-04-29 DIAGNOSIS — Z466 Encounter for fitting and adjustment of urinary device: Secondary | ICD-10-CM | POA: Diagnosis not present

## 2021-05-08 ENCOUNTER — Other Ambulatory Visit: Payer: Self-pay

## 2021-05-08 ENCOUNTER — Ambulatory Visit: Payer: Medicare Other | Admitting: Cardiovascular Disease

## 2021-05-08 ENCOUNTER — Encounter: Payer: Self-pay | Admitting: Cardiovascular Disease

## 2021-05-08 DIAGNOSIS — I5033 Acute on chronic diastolic (congestive) heart failure: Secondary | ICD-10-CM | POA: Diagnosis not present

## 2021-05-08 DIAGNOSIS — Z9889 Other specified postprocedural states: Secondary | ICD-10-CM | POA: Diagnosis not present

## 2021-05-08 DIAGNOSIS — L89892 Pressure ulcer of other site, stage 2: Secondary | ICD-10-CM | POA: Diagnosis not present

## 2021-05-08 DIAGNOSIS — Z794 Long term (current) use of insulin: Secondary | ICD-10-CM | POA: Diagnosis not present

## 2021-05-08 DIAGNOSIS — I5032 Chronic diastolic (congestive) heart failure: Secondary | ICD-10-CM | POA: Diagnosis not present

## 2021-05-08 DIAGNOSIS — G20 Parkinson's disease: Secondary | ICD-10-CM | POA: Diagnosis not present

## 2021-05-08 DIAGNOSIS — E785 Hyperlipidemia, unspecified: Secondary | ICD-10-CM

## 2021-05-08 DIAGNOSIS — I70229 Atherosclerosis of native arteries of extremities with rest pain, unspecified extremity: Secondary | ICD-10-CM | POA: Insufficient documentation

## 2021-05-08 DIAGNOSIS — I69354 Hemiplegia and hemiparesis following cerebral infarction affecting left non-dominant side: Secondary | ICD-10-CM | POA: Diagnosis not present

## 2021-05-08 DIAGNOSIS — I6523 Occlusion and stenosis of bilateral carotid arteries: Secondary | ICD-10-CM

## 2021-05-08 DIAGNOSIS — I10 Essential (primary) hypertension: Secondary | ICD-10-CM

## 2021-05-08 DIAGNOSIS — E1122 Type 2 diabetes mellitus with diabetic chronic kidney disease: Secondary | ICD-10-CM | POA: Diagnosis not present

## 2021-05-08 DIAGNOSIS — N1832 Chronic kidney disease, stage 3b: Secondary | ICD-10-CM | POA: Diagnosis not present

## 2021-05-08 DIAGNOSIS — I130 Hypertensive heart and chronic kidney disease with heart failure and stage 1 through stage 4 chronic kidney disease, or unspecified chronic kidney disease: Secondary | ICD-10-CM | POA: Diagnosis not present

## 2021-05-08 DIAGNOSIS — Z466 Encounter for fitting and adjustment of urinary device: Secondary | ICD-10-CM | POA: Diagnosis not present

## 2021-05-08 DIAGNOSIS — Z951 Presence of aortocoronary bypass graft: Secondary | ICD-10-CM

## 2021-05-08 DIAGNOSIS — S90411D Abrasion, right great toe, subsequent encounter: Secondary | ICD-10-CM | POA: Diagnosis not present

## 2021-05-08 NOTE — Progress Notes (Signed)
05/08/2021 [name]   01-Oct-1946  AN:[id number]  Primary Physician [name], MD Primary Cardiologist: Lorretta Harp MD [name], [place]  HPI:  [name] is a 75 y.o.   mildly overweight but was Caucasian male father of 4 children grandfather of 8 grandchildren who is accompanied by his first ex-wife [name]  who is his caregiver. I last saw him in the office 11/09/2020. His primary care physician is Dr. [name]. He he is a  retired Administrator. His cardiac risk factor profile was  positive for 75 pack years of tobacco smoking having quit in 2009. He is treated hyperlipidemia. He did have bilateral carotid endarterectomies back in 1999 a sister with a stroke that caused left hemiparesis and paresthesias. He had bilateral carotid stenting performed by Dr. Trula Slade and myself in a staged fashion because of restenosis back in 2012. He also clearly has Parkinson's disease which has been progressive over the last year now requiring him to ambulate with the aid of a walker.. He had coronary bypass grafting in 2004 by Dr. Merilynn Finland and a Myoview stress test performed 01/09/11 was nonischemic. He denies chest pain or shortness of breath. He has been diagnosed with bladder cancer and is currently being treated by Dr. Denton Brick. He has had 4 bladder surgeries and treatment with BCG.. Addition, he was recently diagnosed with Parkinson's disease and is wheelchair-bound.   He was hospitalized at [facility] for a week 06/19/2020 for question of aspiration pneumonia and abscess on his buttocks.  He does have metastatic prostate cancer which is now progressed to his bones getting hormonal therapy.  He no longer is wheelchair-bound but bed to stretcher.  Hospice/palliative care is involved and his advanced directives are such that he is a DNR.   He has been seen by Dr. Dellia Nims at the wound care center .  He also has some wounds on his right first fourth and fifth  toes which Dr. Dellia Nims is treating as well.  He had lower extremity arterial Doppler studies performed 10/24/2020 revealing noncompressible ABIs with a total right SFA and popliteal artery.  His serum creatinine is currently elevated 2.6.  Given his comorbidities and life expectancy I do not feel he is a candidate for endovascular therapy at this time and the family agrees.  Since I saw him 6 months ago he continues to do well.  His wounds are slowly healing.  He continues to be seen at the wound care center.  His ex-wife [name], his caregiver, continues to dress his wounds at home which are slowly healing.  He denies chest pain or shortness of breath.   Current Meds  Medication Sig   amLODipine (NORVASC) 10 MG tablet TAKE ONE TABLET BY MOUTH DAILY   carbidopa-levodopa (SINEMET IR) 25-100 MG tablet Take 2 tablets by mouth 3 (three) times daily. Change: Take Two tabs 3 x daily   dantrolene (DANTRIUM) 50 MG capsule Take 1 capsule (50 mg total) by mouth 3 (three) times daily.   dipyridamole-aspirin (AGGRENOX) 200-25 MG 12hr capsule Take 1 capsule by mouth 2 (two) times daily.   ezetimibe (ZETIA) 10 MG tablet Take 1 tablet (10 mg total) by mouth daily.   fenofibrate (TRICOR) 48 MG tablet TAKE ONE TABLET BY MOUTH DAILY   FOLBIC 2.5-25-2 MG TABS tablet Take 1 tablet by mouth once daily with food   furosemide (LASIX) 20 MG tablet TAKE ONE TABLET BY MOUTH DAILY   hydrALAZINE (APRESOLINE)  25 MG tablet Take 1 tablet (25 mg total) by mouth 3 (three) times daily. TAKE ONE TABLET BY MOUTH THREE TIMES A DAY   insulin glargine (LANTUS) 100 UNIT/ML Solostar Pen Inject 10 Units into the skin daily.   KEPPRA 500 MG tablet Take 1 tablet (500 mg total) by mouth 2 (two) times daily.   metoprolol tartrate (LOPRESSOR) 25 MG tablet Take 1 tablet (25 mg total) by mouth daily.   ONETOUCH DELICA LANCETS [id number] MISC    ONETOUCH VERIO test strip    RAPAFLO 8 MG CAPS capsule Take 8 mg by mouth daily with breakfast.     rosuvastatin (CRESTOR) 20 MG tablet TAKE ONE TABLET BY MOUTH DAILY   sulfamethoxazole-trimethoprim (BACTRIM DS) 800-160 MG tablet Take 1 tablet by mouth daily.     Allergies  Allergen Reactions   Altace [Ramipril] Cough   Keflex [Cephalexin] Itching and Rash    Social History   Socioeconomic History   Marital status: Divorced    Spouse name: Not on file   Number of children: 4   Years of education: HS   Highest education level: Not on file  Occupational History    Employer: RETIRED  Tobacco Use   Smoking status: Former    Packs/day: 0.25    Years: 5.00    Pack years: 1.25    Types: Cigarettes    Quit date: 11/14/2007    Years since quitting: 13.4   Smokeless tobacco: Never  Vaping Use   Vaping Use: Never used  Substance and Sexual Activity   Alcohol use: No   Drug use: No   Sexual activity: Not Currently  Other Topics Concern   Not on file  Social History Narrative   Patient lives at home with his caregiver/ex-wife.   Caffeine- 2 cups of coffee daily   Social Determinants of Health   Financial Resource Strain: Not on file  Food Insecurity: Not on file  Transportation Needs: Not on file  Physical Activity: Not on file  Stress: Not on file  Social Connections: Not on file  Intimate Partner Violence: Not on file     Review of Systems: General: negative for chills, fever, night sweats or weight changes.  Cardiovascular: negative for chest pain, dyspnea on exertion, edema, orthopnea, palpitations, paroxysmal nocturnal dyspnea or shortness of breath Dermatological: negative for rash Respiratory: negative for cough or wheezing Urologic: negative for hematuria Abdominal: negative for nausea, vomiting, diarrhea, bright red blood per rectum, melena, or hematemesis Neurologic: negative for visual changes, syncope, or dizziness All other systems reviewed and are otherwise negative except as noted above.    Blood pressure (!) 118/52, pulse 62, height '5\' 5"'$  (1.651 m),  weight 160 lb (72.6 kg), SpO2 99 %.  General appearance: alert and no distress Neck: no adenopathy, no carotid bruit, no JVD, supple, symmetrical, trachea midline, and thyroid not enlarged, symmetric, no tenderness/mass/nodules Lungs: clear to auscultation bilaterally Heart: regular rate and rhythm, S1, S2 normal, no murmur, click, rub or gallop Extremities: extremities normal, atraumatic, no cyanosis or edema Pulses: 2+ and symmetric Skin: Skin color, texture, turgor normal. No rashes or lesions Neurologic: Grossly normal  EKG sinus rhythm at 62 without ST or T wave changes.  I personally reviewed this EKG.  ASSESSMENT AND PLAN:   Carotid stenosis History of carotid artery disease status post bilateral carotid endarterectomies in 1999 and staged bilateral carotid artery stenting by myself and Dr. Trula Slade in 2012.  His last carotid Doppler studies performed 11/01/2018 revealed his stents  to be widely patent.  Hx of CABG History of CAD status post coronary artery bypass grafting in 2004 by Dr. Roxan Hockey with a negative Myoview 01/09/2011.  He denies chest pain or shortness of breath.  Dyslipidemia, goal LDL below 70 History of dyslipidemia on rosuvastatin, Zetia and fenofibrate with lipid profile performed 05/03/2020 revealing total cholesterol of 112, LDL 48 and HDL of 35.  Essential hypertension History of essential hypertension blood pressure measured today 118/52.  He is on amlodipine and metoprolol.  Acute on chronic diastolic CHF (congestive heart failure) (HCC) History of chronic diastolic heart failure on furosemide with 2D echo performed 06/13/2020 revealing normal LV systolic function with grade 2 diastolic dysfunction and no significant valvular abnormalities.  He has no peripheral edema.  Critical limb ischemia with history of revascularization of same extremity ([place]) History of slowly healing wounds on his right first fourth and fifth toes being seen at the wound center.  He  did have Doppler studies performed 10/24/2020 that showed occluded right popliteal artery.  Given his age, comorbidities, metastatic cancer and renal insufficiency it was decided by the patient, his caregiver and myself that we would not pursue an aggressive approach.  His wounds have been slowly healing given aggressive local wound care.     Lorretta Harp MD FACP,FACC,FAHA, [facility] 05/08/2021 11:25 AM

## 2021-05-08 NOTE — Assessment & Plan Note (Signed)
History of CAD status post coronary artery bypass grafting in 2004 by Dr. Roxan Hockey with a negative Myoview 01/09/2011.  He denies chest pain or shortness of breath.

## 2021-05-08 NOTE — Assessment & Plan Note (Signed)
History of slowly healing wounds on his right first fourth and fifth toes being seen at the wound center.  He did have Doppler studies performed 10/24/2020 that showed occluded right popliteal artery.  Given his age, comorbidities, metastatic cancer and renal insufficiency it was decided by the patient, his caregiver and myself that we would not pursue an aggressive approach.  His wounds have been slowly healing given aggressive local wound care.

## 2021-05-08 NOTE — Assessment & Plan Note (Signed)
History of essential hypertension blood pressure measured today 118/52.  He is on amlodipine and metoprolol.

## 2021-05-08 NOTE — Assessment & Plan Note (Signed)
History of dyslipidemia on rosuvastatin, Zetia and fenofibrate with lipid profile performed 05/03/2020 revealing total cholesterol of 112, LDL 48 and HDL of 35.

## 2021-05-08 NOTE — Patient Instructions (Signed)
  Follow-Up: At [facility], you and your health needs are our priority.  As part of our continuing mission to provide you with exceptional heart care, we have created designated Provider Care Teams.  These Care Teams include your primary Cardiologist (physician) and Advanced Practice Providers (APPs -  Physician Assistants and Nurse Practitioners) who all work together to provide you with the care you need, when you need it.  We recommend signing up for the patient portal called "MyChart".  Sign up information is provided on this After Visit Summary.  MyChart is used to connect with patients for Virtual Visits (Telemedicine).  Patients are able to view lab/test results, encounter notes, upcoming appointments, etc.  Non-urgent messages can be sent to your provider as well.   To learn more about what you can do with MyChart, go to [email or web address].    Your next appointment:   6 month(s)  The format for your next appointment:   In Person  Provider:   Quay Burow, MD

## 2021-05-08 NOTE — Assessment & Plan Note (Signed)
History of chronic diastolic heart failure on furosemide with 2D echo performed 06/13/2020 revealing normal LV systolic function with grade 2 diastolic dysfunction and no significant valvular abnormalities.  He has no peripheral edema.

## 2021-05-08 NOTE — Assessment & Plan Note (Signed)
History of carotid artery disease status post bilateral carotid endarterectomies in 1999 and staged bilateral carotid artery stenting by myself and Dr. Trula Slade in 2012.  His last carotid Doppler studies performed 11/01/2018 revealed his stents to be widely patent.

## 2021-05-10 DIAGNOSIS — R339 Retention of urine, unspecified: Secondary | ICD-10-CM | POA: Diagnosis not present

## 2021-05-14 DIAGNOSIS — C7919 Secondary malignant neoplasm of other urinary organs: Secondary | ICD-10-CM | POA: Diagnosis not present

## 2021-05-16 DIAGNOSIS — E1122 Type 2 diabetes mellitus with diabetic chronic kidney disease: Secondary | ICD-10-CM | POA: Diagnosis not present

## 2021-05-16 DIAGNOSIS — I69354 Hemiplegia and hemiparesis following cerebral infarction affecting left non-dominant side: Secondary | ICD-10-CM | POA: Diagnosis not present

## 2021-05-16 DIAGNOSIS — C7982 Secondary malignant neoplasm of genital organs: Secondary | ICD-10-CM | POA: Diagnosis not present

## 2021-05-16 DIAGNOSIS — Z794 Long term (current) use of insulin: Secondary | ICD-10-CM | POA: Diagnosis not present

## 2021-05-16 DIAGNOSIS — D649 Anemia, unspecified: Secondary | ICD-10-CM | POA: Diagnosis not present

## 2021-05-16 DIAGNOSIS — E559 Vitamin D deficiency, unspecified: Secondary | ICD-10-CM | POA: Diagnosis not present

## 2021-05-16 DIAGNOSIS — L8990 Pressure ulcer of unspecified site, unspecified stage: Secondary | ICD-10-CM | POA: Diagnosis not present

## 2021-05-16 DIAGNOSIS — E441 Mild protein-calorie malnutrition: Secondary | ICD-10-CM | POA: Diagnosis not present

## 2021-05-16 DIAGNOSIS — N183 Chronic kidney disease, stage 3 unspecified: Secondary | ICD-10-CM | POA: Diagnosis not present

## 2021-05-16 DIAGNOSIS — Z0000 Encounter for general adult medical examination without abnormal findings: Secondary | ICD-10-CM | POA: Diagnosis not present

## 2021-05-16 DIAGNOSIS — Z79899 Other long term (current) drug therapy: Secondary | ICD-10-CM | POA: Diagnosis not present

## 2021-05-16 DIAGNOSIS — I10 Essential (primary) hypertension: Secondary | ICD-10-CM | POA: Diagnosis not present

## 2021-05-21 DIAGNOSIS — I130 Hypertensive heart and chronic kidney disease with heart failure and stage 1 through stage 4 chronic kidney disease, or unspecified chronic kidney disease: Secondary | ICD-10-CM | POA: Diagnosis not present

## 2021-05-21 DIAGNOSIS — N1832 Chronic kidney disease, stage 3b: Secondary | ICD-10-CM | POA: Diagnosis not present

## 2021-05-21 DIAGNOSIS — I5032 Chronic diastolic (congestive) heart failure: Secondary | ICD-10-CM | POA: Diagnosis not present

## 2021-05-21 DIAGNOSIS — Z794 Long term (current) use of insulin: Secondary | ICD-10-CM | POA: Diagnosis not present

## 2021-05-21 DIAGNOSIS — I69354 Hemiplegia and hemiparesis following cerebral infarction affecting left non-dominant side: Secondary | ICD-10-CM | POA: Diagnosis not present

## 2021-05-21 DIAGNOSIS — G20 Parkinson's disease: Secondary | ICD-10-CM | POA: Diagnosis not present

## 2021-05-21 DIAGNOSIS — E1122 Type 2 diabetes mellitus with diabetic chronic kidney disease: Secondary | ICD-10-CM | POA: Diagnosis not present

## 2021-05-21 DIAGNOSIS — L89892 Pressure ulcer of other site, stage 2: Secondary | ICD-10-CM | POA: Diagnosis not present

## 2021-05-21 DIAGNOSIS — Z466 Encounter for fitting and adjustment of urinary device: Secondary | ICD-10-CM | POA: Diagnosis not present

## 2021-05-21 DIAGNOSIS — S90411D Abrasion, right great toe, subsequent encounter: Secondary | ICD-10-CM | POA: Diagnosis not present

## 2021-05-22 ENCOUNTER — Other Ambulatory Visit (HOSPITAL_COMMUNITY): Payer: Self-pay | Admitting: Urology

## 2021-05-22 DIAGNOSIS — S90411D Abrasion, right great toe, subsequent encounter: Secondary | ICD-10-CM | POA: Diagnosis not present

## 2021-05-22 DIAGNOSIS — L89892 Pressure ulcer of other site, stage 2: Secondary | ICD-10-CM | POA: Diagnosis not present

## 2021-05-22 DIAGNOSIS — C61 Malignant neoplasm of prostate: Secondary | ICD-10-CM

## 2021-05-27 DIAGNOSIS — G20 Parkinson's disease: Secondary | ICD-10-CM | POA: Diagnosis not present

## 2021-05-27 DIAGNOSIS — I5032 Chronic diastolic (congestive) heart failure: Secondary | ICD-10-CM | POA: Diagnosis not present

## 2021-05-27 DIAGNOSIS — L89892 Pressure ulcer of other site, stage 2: Secondary | ICD-10-CM | POA: Diagnosis not present

## 2021-05-27 DIAGNOSIS — S90411D Abrasion, right great toe, subsequent encounter: Secondary | ICD-10-CM | POA: Diagnosis not present

## 2021-05-27 DIAGNOSIS — I69354 Hemiplegia and hemiparesis following cerebral infarction affecting left non-dominant side: Secondary | ICD-10-CM | POA: Diagnosis not present

## 2021-05-27 DIAGNOSIS — Z794 Long term (current) use of insulin: Secondary | ICD-10-CM | POA: Diagnosis not present

## 2021-05-27 DIAGNOSIS — N1832 Chronic kidney disease, stage 3b: Secondary | ICD-10-CM | POA: Diagnosis not present

## 2021-05-27 DIAGNOSIS — E1122 Type 2 diabetes mellitus with diabetic chronic kidney disease: Secondary | ICD-10-CM | POA: Diagnosis not present

## 2021-05-27 DIAGNOSIS — Z466 Encounter for fitting and adjustment of urinary device: Secondary | ICD-10-CM | POA: Diagnosis not present

## 2021-05-27 DIAGNOSIS — I130 Hypertensive heart and chronic kidney disease with heart failure and stage 1 through stage 4 chronic kidney disease, or unspecified chronic kidney disease: Secondary | ICD-10-CM | POA: Diagnosis not present

## 2021-05-28 DIAGNOSIS — R339 Retention of urine, unspecified: Secondary | ICD-10-CM | POA: Diagnosis not present

## 2021-05-29 DIAGNOSIS — L89892 Pressure ulcer of other site, stage 2: Secondary | ICD-10-CM | POA: Diagnosis not present

## 2021-06-04 ENCOUNTER — Other Ambulatory Visit: Payer: Self-pay

## 2021-06-04 ENCOUNTER — Ambulatory Visit: Payer: Medicare Other | Admitting: Dermatology

## 2021-06-04 ENCOUNTER — Encounter: Payer: Self-pay | Admitting: Dermatology

## 2021-06-04 DIAGNOSIS — I5032 Chronic diastolic (congestive) heart failure: Secondary | ICD-10-CM | POA: Diagnosis not present

## 2021-06-04 DIAGNOSIS — Z1283 Encounter for screening for malignant neoplasm of skin: Secondary | ICD-10-CM | POA: Diagnosis not present

## 2021-06-04 DIAGNOSIS — N1832 Chronic kidney disease, stage 3b: Secondary | ICD-10-CM | POA: Diagnosis not present

## 2021-06-04 DIAGNOSIS — I69354 Hemiplegia and hemiparesis following cerebral infarction affecting left non-dominant side: Secondary | ICD-10-CM | POA: Diagnosis not present

## 2021-06-04 DIAGNOSIS — C44329 Squamous cell carcinoma of skin of other parts of face: Secondary | ICD-10-CM | POA: Diagnosis not present

## 2021-06-04 DIAGNOSIS — S90411D Abrasion, right great toe, subsequent encounter: Secondary | ICD-10-CM | POA: Diagnosis not present

## 2021-06-04 DIAGNOSIS — Z466 Encounter for fitting and adjustment of urinary device: Secondary | ICD-10-CM | POA: Diagnosis not present

## 2021-06-04 DIAGNOSIS — Z794 Long term (current) use of insulin: Secondary | ICD-10-CM | POA: Diagnosis not present

## 2021-06-04 DIAGNOSIS — D485 Neoplasm of uncertain behavior of skin: Secondary | ICD-10-CM

## 2021-06-04 DIAGNOSIS — G20 Parkinson's disease: Secondary | ICD-10-CM | POA: Diagnosis not present

## 2021-06-04 DIAGNOSIS — E1122 Type 2 diabetes mellitus with diabetic chronic kidney disease: Secondary | ICD-10-CM | POA: Diagnosis not present

## 2021-06-04 DIAGNOSIS — I130 Hypertensive heart and chronic kidney disease with heart failure and stage 1 through stage 4 chronic kidney disease, or unspecified chronic kidney disease: Secondary | ICD-10-CM | POA: Diagnosis not present

## 2021-06-04 DIAGNOSIS — L89892 Pressure ulcer of other site, stage 2: Secondary | ICD-10-CM | POA: Diagnosis not present

## 2021-06-04 NOTE — Patient Instructions (Signed)

## 2021-06-05 ENCOUNTER — Ambulatory Visit (HOSPITAL_COMMUNITY)
Admission: RE | Admit: 2021-06-05 | Discharge: 2021-06-05 | Disposition: A | Discharge status: Home / Self Care | Payer: Medicare Other | Source: Ambulatory Visit | Attending: Urology | Admitting: Urology

## 2021-06-05 DIAGNOSIS — I700 Atherosclerosis of aorta: Secondary | ICD-10-CM | POA: Diagnosis not present

## 2021-06-05 DIAGNOSIS — C61 Malignant neoplasm of prostate: Secondary | ICD-10-CM | POA: Diagnosis present

## 2021-06-05 DIAGNOSIS — C7951 Secondary malignant neoplasm of bone: Secondary | ICD-10-CM | POA: Diagnosis not present

## 2021-06-05 DIAGNOSIS — I2510 Atherosclerotic heart disease of native coronary artery without angina pectoris: Secondary | ICD-10-CM | POA: Diagnosis not present

## 2021-06-05 MED ORDER — PIFLIFOLASTAT F 18 (PYLARIFY) INJECTION
9.0000 | Freq: Once | INTRAVENOUS | Status: AC
  Administered 2021-06-05: 8.58 via INTRAVENOUS

## 2021-06-06 ENCOUNTER — Other Ambulatory Visit: Payer: Self-pay

## 2021-06-06 ENCOUNTER — Encounter (HOSPITAL_BASED_OUTPATIENT_CLINIC_OR_DEPARTMENT_OTHER): Payer: Medicare Other | Attending: Internal Medicine | Admitting: Internal Medicine

## 2021-06-06 DIAGNOSIS — E1122 Type 2 diabetes mellitus with diabetic chronic kidney disease: Secondary | ICD-10-CM | POA: Diagnosis not present

## 2021-06-06 DIAGNOSIS — L97528 Non-pressure chronic ulcer of other part of left foot with other specified severity: Secondary | ICD-10-CM | POA: Insufficient documentation

## 2021-06-06 DIAGNOSIS — C61 Malignant neoplasm of prostate: Secondary | ICD-10-CM | POA: Diagnosis not present

## 2021-06-06 DIAGNOSIS — C7951 Secondary malignant neoplasm of bone: Secondary | ICD-10-CM | POA: Insufficient documentation

## 2021-06-06 DIAGNOSIS — L97514 Non-pressure chronic ulcer of other part of right foot with necrosis of bone: Secondary | ICD-10-CM | POA: Insufficient documentation

## 2021-06-06 DIAGNOSIS — C7911 Secondary malignant neoplasm of bladder: Secondary | ICD-10-CM | POA: Diagnosis not present

## 2021-06-06 DIAGNOSIS — E1151 Type 2 diabetes mellitus with diabetic peripheral angiopathy without gangrene: Secondary | ICD-10-CM | POA: Insufficient documentation

## 2021-06-06 DIAGNOSIS — I130 Hypertensive heart and chronic kidney disease with heart failure and stage 1 through stage 4 chronic kidney disease, or unspecified chronic kidney disease: Secondary | ICD-10-CM | POA: Insufficient documentation

## 2021-06-06 DIAGNOSIS — E11621 Type 2 diabetes mellitus with foot ulcer: Secondary | ICD-10-CM | POA: Diagnosis not present

## 2021-06-06 DIAGNOSIS — G20 Parkinson's disease: Secondary | ICD-10-CM | POA: Diagnosis not present

## 2021-06-06 DIAGNOSIS — I5032 Chronic diastolic (congestive) heart failure: Secondary | ICD-10-CM | POA: Diagnosis not present

## 2021-06-06 DIAGNOSIS — Z87891 Personal history of nicotine dependence: Secondary | ICD-10-CM | POA: Insufficient documentation

## 2021-06-06 DIAGNOSIS — G40909 Epilepsy, unspecified, not intractable, without status epilepticus: Secondary | ICD-10-CM | POA: Diagnosis not present

## 2021-06-06 DIAGNOSIS — N183 Chronic kidney disease, stage 3 unspecified: Secondary | ICD-10-CM | POA: Insufficient documentation

## 2021-06-06 DIAGNOSIS — L0231 Cutaneous abscess of buttock: Secondary | ICD-10-CM | POA: Diagnosis not present

## 2021-06-10 ENCOUNTER — Telehealth: Payer: Self-pay | Admitting: *Deleted

## 2021-06-10 NOTE — Telephone Encounter (Signed)
-----   Message from Lavonna Monarch, MD sent at 06/07/2021  7:03 PM EDT ----- Schedule Mohs

## 2021-06-10 NOTE — Telephone Encounter (Signed)
Path report to patients caregiver. Will send over pathology and notes to skin surgery center.

## 2021-06-11 NOTE — Progress Notes (Signed)
[name], [name] ([id number]) . Visit Report for 06/06/2021 Chief Complaint Document Details Patient Name: Date of Service: [name], [name]. 06/06/2021 2:30 PM Medical Record Number: [id number] Patient Account Number: [id number] Date of Birth/Sex: Treating RN: Jan 31, 1946 (75 y.o. [name], [name] Primary Care Provider: [name] Other Clinician: Referring Provider: Treating Provider/Extender: Theodosia Quay in Treatment: 23 Information Obtained from: Patient Chief Complaint Bilateral toe wounds Electronic Signature(s) Signed: 06/06/2021 3:33:31 PM By: Kalman Shan DO Entered By: Kalman Shan on 06/06/2021 15:22:24 -------------------------------------------------------------------------------- HPI Details Patient Name: Date of Service: [name]. 06/06/2021 2:30 PM Medical Record Number: [id number] Patient Account Number: [id number] Date of Birth/Sex: Treating RN: 1946-01-09 (75 y.o. [name], [name] Primary Care Provider: [name] Other Clinician: Referring Provider: Treating Provider/Extender: Theodosia Quay in Treatment: 21 History of Present Illness HPI Description: ADMISSION 07/27/2020 This is a 75 year old man who is accompanied by his wife (ex). Nevertheless she is his active caregiver. His most complicated features are remote CVA that left him with a left hemiparesis. He also has widely metastatic prostate cancer to bone which I think is contributed to increasing frailty this year and he is no longer ambulatory. He required hospitalization from 9/7 through 9/14 with respiratory failure seizures. During this time it was noted that he had an open area in the left buttock in close proximity to the gluteal cleft. His wife tells me that he has a history of perirectal abscesses. And she describes this area as starting as a painful swelling in late August certainly sounds like an abscess. When he is  in the hospital he required bedside IandD's but did not go to the OR. Has CT scan of the area showed soft tissue thickening in the perianal ulcer. At that point it was concerning for a fistula connection however a discrete abscess was not seen. Notable that he was hypoalbuminemic in the hospital but actually came out better with an albumin of 3.1 on 9/14 his wife says he is eating well. They are currently treating this with Santyl ointment and a backing wet-to-dry dressing. By description of his wife this is done quite nicely and there is certainly less debris over the wound surface. They already have a hospital bed with a level 3 pressure relief surface. The patient lies on his back when he is in bed especially at night although his wife is counseled him not to do this. They are starting sliding board transfers into his wheelchair I think with physical therapy. He has a Foley catheter in place Past medical history includes remote CVA, metastatic prostate cancer to large areas of his thoracic and lumbar spine and pelvis., Bilateral carotid artery stenosis, Parkinson's disease, type 2 diabetes, diastolic heart failure, CHF, hypertension and a recently discovered possible pancreatic mass in the head of the pancreas 08/16/2020 patient I admitted to the clinic 2-1/2 weeks ago. He had an abscess site on his left buttock that required an IandD while he was in the hospital in September. He has been using Santyl backing wet-to-dry. He is cared for her aerobically at home by his wife 2 concerning areas on his feet. A stage I pressure injury on the right heel this does not have an open wound. He also has on the lateral aspect of the left fourth toe a dry circular eschared wound. 12/2; monthly follow-up. Is a very disabled man who had an abscess in his left buttock and required an IandD. We have been using Santyl to  this area I changed to silver collagen last time he is here and this actually looks quite a bit  better The last time he was here his wife showed me an area on the left lateral fourth toe I think a pressure injury with his fifth toe. Been using Santyl here as well but not making much improvement As well she shows me he has an area on his right buttock which is very tiny pinpoint area but under illumination still obviously an open area. I wonder if this is more of a shear injury and transferring and a pressure injury per se 12/16; this is a very disabled man who has a left buttock area that required an IandD apparently an abscess. Since he has come here that he developed a area on the right buttock. His wife says that this is draining pus. Finally he has an area on the lateral part of the left fourth toe. We have been using silver collagen on the buttock and Iodoflex on the fourth toe 1/6; this is a disabled man we have been working on a left buttock area that was initially an IandD abscess injury. We have got this down to a small area in fact it appears to be epithelializing towards the base of the wound. When he was here last time he had a small draining area on the right buttock. I thought this might be a small cyst culture of the drainage was negative. I do not think this was an abscess at all While he has been here he has been developing areas on the toes on his left foot. He initially had one on the left fourth toe and then last time the right first toe. He is a type II diabetic. With he has a history of coronary artery disease. He complains of a lot of pain in the right foot 1/13; we have been working on areas on his left buttock and then on the right buttock. Both of these are healed today albeit the area on the left has some depth with skin is gone into a divot. Nevertheless I think this is a reasonable outcome. He arrived a week ago with ischemic looking wounds on his right fifth, right first and his left fourth toe. He went on to have arterial studies which we received today these  showed an ABI that was noncompressible on both sides however the great toe on the right had a TBI of 0.14 with a pressure of only 17. Monophasic waveforms on all the tibial vessels and including the popliteal on the left he had monophasic waveforms again noncompressible vessels and no recorded toe pressure. Clearly these are ischemic wounds. Fortunately he is not complaining of a lot of pain The patient has a complicated medical situation which includes widely metastatic prostate cancer to bone for which she is on palliative care he also has Parkinson's disease. I have a recent note from his neurologist at which time he asked whether they wish to de-escalate the Parkinson's regimen and the answer was no. The patient has a follow-up appointment with Dr. Gwenlyn Found on 1/28. I believe he is the patient's cardiologist but this appointment was made because of the vascular studies I think predominantly through Dr. Kennon Holter office. 2/10; patient went back to see Dr. Gwenlyn Found. He did not feel given his comorbidities that he was a candidate for endovascular therapy. His wife seems to agree with that. He has ischemic wounds on his right toes particularly the right fifth toe fourth toe  and first toe. He is not in a lot of pain unless these are manipulated 12/20/2020 on evaluation today patient appears to be doing well with regard to his feet all things considered. There does not appear to be any obvious signs of infection here. Most of the wounds are actually for the most part eschar and dry. With that being said the patient is not actually a candidate for revascularization according to Dr. Gwenlyn Found who he has seen previous. Nonetheless I think that for the time being Betadine or something of that sort just keep things clean and dry will be a good way to go. 3/24; patient has 3 areas on the dorsal right toes first fourth and fifth that are necrotic probably ischemic. He was not felt to be a candidate for revascularization  therapy. He also has an area on the left fourth toe in a similar situation. His significant other is concerned about possibility of an abscess on his buttock. He was put on antibiotics last time. I must say that when he was here before he seemed to have recurrent small abscesses but the purulence in this culture is negative I wondered about an epidermoid cyst although this area currently is not in the same spot 4/21; patient presents for his 1 month follow-up. He has chronic necrotic right toes. The affected toes include the first fourth and fifth digits. The third toe wound has closed. He uses Betadine on this daily. He has no complaints today. He denies fever chills, erythema to the wound sites. 5/19; patient presents for 1 month follow-up. He has had no issues in the past month with his bilateral toe wounds due to arterial insufficiency. He continues to use Betadine on these daily. He has no complaints today. He denies signs of infection. 6/30; patient presents for 6-week follow-up. He denies any issues. He has been using Betadine to the areas of necrosis on the right toes. He denies signs of infection. 8/25; patient presents for 59-monthfollow-up. He states that 1 week ago he developed some thick drainage from the fourth toe near the toenail. Patient has been putting antibiotic ointment to this area. He denies increased warmth or erythema to the fourth toe. He denies systemic signs of infection. He continues to use Betadine to the other necrotic areas. Electronic Signature(s) Signed: 06/06/2021 3:33:31 PM By: HKalman ShanDO Entered By: HKalman Shanon 06/06/2021 15:24:13 -------------------------------------------------------------------------------- Physical Exam Details Patient Name: Date of Service: Luis PenceHN [name]. 06/06/2021 2:30 PM Medical Record Number: [id number] Account Number: [id number] of Birth/Sex: Treating RN: 706/16/1947(75y.o. MErie NoePrimary Care Provider: WJonathon JordanOther Clinician: Referring Provider: Treating Provider/Extender: HTheodosia Quayin Treatment: 441Constitutional respirations regular, non-labored and within target range for patient..Marland KitchenPsychiatric pleasant and cooperative. Notes Right foot: He has eschar to the dorsal first and fourth toes. I am unable to express drainage however the intake nurse was able to get some scant thick yellow drainage. No increased warmth or erythema to the fourth toe. No more eschar to the fifth toe. Foot and leg are warm. Electronic Signature(s) Signed: 06/06/2021 3:33:31 PM By: HKalman ShanDO Entered By: HKalman Shanon 06/06/2021 15:27:25 -------------------------------------------------------------------------------- Physician Orders Details Patient Name: Date of Service: Luis PenceHN [name]. 06/06/2021 2:30 PM Medical Record Number: [id number] Account Number: [id number] of Birth/Sex: Treating RN: 708-07-1946(75y.o. MJanyth ContesPrimary Care Provider: WJonathon JordanOther Clinician: Referring Provider: Treating Provider/Extender: HTheodosia Quay  in Treatment: 33 Verbal / Phone Orders: No Diagnosis Coding ICD-10 Coding Code Description L97.528 Non-pressure chronic ulcer of other part of left foot with other specified severity L97.514 Non-pressure chronic ulcer of other part of right foot with necrosis of bone E11.51 Type 2 diabetes mellitus with diabetic peripheral angiopathy without gangrene I73.9 Peripheral vascular disease, unspecified Follow-up Appointments ppointment in 1 week. - with Dr. Heber [place] Return A Bathing/ Shower/ Hygiene May shower with protection but do not get wound dressing(s) wet. Edema Control - Lymphedema / SCD / Other Moisturize legs daily. Off-Loading Low air-loss mattress (Group 2) Turn and reposition every 2 hours Other: - CONTINUE TO USE BUNNY BOOTS  FOR HEEL PROTECTION. UP IN WHEELCHAIR NO MORE THAT 2 HOUR INCREMENTS. Ensure to relieve pressure off buttock closed areas to prevent reopening. Home Health No change in wound care orders this week; continue Home Health for wound care. May utilize formulary equivalent dressing for wound treatment orders unless otherwise specified. Other Home Health Orders/Instructions: - [facility] Wound Treatment Wound #4 - [name] Great oe Wound Laterality: Right Cleanser: Wound Cleanser (Home Health) 1 x Per Day/30 Days Discharge Instructions: Cleanse the wound with wound cleanser prior to applying a clean dressing using gauze sponges, not tissue or cotton balls. Prim Dressing: Betadine 1 x Per Day/30 Days ary Discharge Instructions: apply betadine to wound. Secondary Dressing: Woven Gauze Sponges 2x2 in (Home Health) 1 x Per Day/30 Days Discharge Instructions: Apply over primary dressing as directed. Secured With: Child psychotherapist, Sterile 2x75 (in/in) (Home Health) 1 x Per Day/30 Days Discharge Instructions: Secure with stretch gauze as directed. Wound #6 - [name] Fourth oe Wound Laterality: Right Cleanser: Wound Cleanser (Home Health) 1 x Per Day/30 Days Discharge Instructions: Cleanse the wound with wound cleanser prior to applying a clean dressing using gauze sponges, not tissue or cotton balls. Prim Dressing: Betadine 1 x Per Day/30 Days ary Discharge Instructions: apply betadine to wound. Secondary Dressing: Woven Gauze Sponges 2x2 in (Home Health) 1 x Per Day/30 Days Discharge Instructions: Apply over primary dressing as directed. Secured With: Child psychotherapist, Sterile 2x75 (in/in) (Home Health) 1 x Per Day/30 Days Discharge Instructions: Secure with stretch gauze as directed. Patient Medications llergies: Altace, cephalexin A Notifications Medication Indication Start End 06/06/2021 Bactrim DS DOSE 1 - oral 800 mg-160 mg tablet - 1 tablet twice daily for 7  days Electronic Signature(s) Signed: 06/06/2021 3:29:15 PM By: Kalman Shan DO Entered By: Kalman Shan on 06/06/2021 15:29:14 -------------------------------------------------------------------------------- Problem List Details Patient Name: Date of Service: [name]. 06/06/2021 2:30 PM Medical Record Number: [id number] Patient Account Number: [id number] Date of Birth/Sex: Treating RN: 1946/08/17 (75 y.o. [name] Primary Care Provider: [name] Other Clinician: Referring Provider: Treating Provider/Extender: Theodosia Quay in Treatment: 28 Active Problems ICD-10 Encounter Code Description Active Date MDM Diagnosis L97.528 Non-pressure chronic ulcer of other part of left foot with other specified 08/16/2020 No Yes severity L97.514 Non-pressure chronic ulcer of other part of right foot with necrosis of bone 10/18/2020 No Yes E11.51 Type 2 diabetes mellitus with diabetic peripheral angiopathy without gangrene 10/18/2020 No Yes I73.9 Peripheral vascular disease, unspecified 06/06/2021 No Yes Inactive Problems ICD-10 Code Description Active Date Inactive Date L89.611 Pressure ulcer of right heel, stage 1 08/16/2020 08/16/2020 L89.312 Pressure ulcer of right buttock, stage 2 09/13/2020 09/13/2020 Resolved Problems ICD-10 Code Description Active Date Resolved Date L02.31 Cutaneous abscess of buttock 07/27/2020 07/27/2020 S31.829D Unspecified open wound of left buttock, subsequent encounter  07/27/2020 07/27/2020 Electronic Signature(s) Signed: 06/06/2021 3:33:31 PM By: Kalman Shan DO Entered By: Kalman Shan on 06/06/2021 15:22:03 -------------------------------------------------------------------------------- Progress Note Details Patient Name: Date of Service: [name]. 06/06/2021 2:30 PM Medical Record Number: [id number] Patient Account Number: [id number] Date of Birth/Sex: Treating RN: 28-Mar-1946 (75 y.o. [name], [name] Primary Care Provider: [name] Other Clinician: Referring Provider: Treating Provider/Extender: Theodosia Quay in Treatment: 48 Subjective Chief Complaint Information obtained from Patient Bilateral toe wounds History of Present Illness (HPI) ADMISSION 07/27/2020 This is a 75 year old man who is accompanied by his wife (ex). Nevertheless she is his active caregiver. His most complicated features are remote CVA that left him with a left hemiparesis. He also has widely metastatic prostate cancer to bone which I think is contributed to increasing frailty this year and he is no longer ambulatory. He required hospitalization from 9/7 through 9/14 with respiratory failure seizures. During this time it was noted that he had an open area in the left buttock in close proximity to the gluteal cleft. His wife tells me that he has a history of perirectal abscesses. And she describes this area as starting as a painful swelling in late August certainly sounds like an abscess. When he is in the hospital he required bedside IandD's but did not go to the OR. Has CT scan of the area showed soft tissue thickening in the perianal ulcer. At that point it was concerning for a fistula connection however a discrete abscess was not seen. Notable that he was hypoalbuminemic in the hospital but actually came out better with an albumin of 3.1 on 9/14 his wife says he is eating well. They are currently treating this with Santyl ointment and a backing wet-to-dry dressing. By description of his wife this is done quite nicely and there is certainly less debris over the wound surface. They already have a hospital bed with a level 3 pressure relief surface. The patient lies on his back when he is in bed especially at night although his wife is counseled him not to do this. They are starting sliding board transfers into his wheelchair I think with physical therapy. He has a  Foley catheter in place Past medical history includes remote CVA, metastatic prostate cancer to large areas of his thoracic and lumbar spine and pelvis., Bilateral carotid artery stenosis, Parkinson's disease, type 2 diabetes, diastolic heart failure, CHF, hypertension and a recently discovered possible pancreatic mass in the head of the pancreas 08/16/2020 patient I admitted to the clinic 2-1/2 weeks ago. He had an abscess site on his left buttock that required an IandD while he was in the hospital in September. He has been using Santyl backing wet-to-dry. He is cared for her aerobically at home by his wife 2 concerning areas on his feet. A stage I pressure injury on the right heel this does not have an open wound. He also has on the lateral aspect of the left fourth toe a dry circular eschared wound. 12/2; monthly follow-up. Is a very disabled man who had an abscess in his left buttock and required an IandD. We have been using Santyl to this area I changed to silver collagen last time he is here and this actually looks quite a bit better ooThe last time he was here his wife showed me an area on the left lateral fourth toe I think a pressure injury with his fifth toe. Been using Santyl here as well but not making much improvement ooAs  well she shows me he has an area on his right buttock which is very tiny pinpoint area but under illumination still obviously an open area. I wonder if this is more of a shear injury and transferring and a pressure injury per se 12/16; this is a very disabled man who has a left buttock area that required an IandD apparently an abscess. Since he has come here that he developed a area on the right buttock. His wife says that this is draining pus. Finally he has an area on the lateral part of the left fourth toe. We have been using silver collagen on the buttock and Iodoflex on the fourth toe 1/6; this is a disabled man we have been working on a left buttock area that  was initially an IandD abscess injury. We have got this down to a small area in fact it appears to be epithelializing towards the base of the wound. When he was here last time he had a small draining area on the right buttock. I thought this might be a small cyst culture of the drainage was negative. I do not think this was an abscess at all While he has been here he has been developing areas on the toes on his left foot. He initially had one on the left fourth toe and then last time the right first toe. He is a type II diabetic. With he has a history of coronary artery disease. He complains of a lot of pain in the right foot 1/13; we have been working on areas on his left buttock and then on the right buttock. Both of these are healed today albeit the area on the left has some depth with skin is gone into a divot. Nevertheless I think this is a reasonable outcome. He arrived a week ago with ischemic looking wounds on his right fifth, right first and his left fourth toe. He went on to have arterial studies which we received today these showed an ABI that was noncompressible on both sides however the great toe on the right had a TBI of 0.14 with a pressure of only 17. Monophasic waveforms on all the tibial vessels and including the popliteal on the left he had monophasic waveforms again noncompressible vessels and no recorded toe pressure. Clearly these are ischemic wounds. Fortunately he is not complaining of a lot of pain The patient has a complicated medical situation which includes widely metastatic prostate cancer to bone for which she is on palliative care he also has Parkinson's disease. I have a recent note from his neurologist at which time he asked whether they wish to de-escalate the Parkinson's regimen and the answer was no. The patient has a follow-up appointment with Dr. Gwenlyn Found on 1/28. I believe he is the patient's cardiologist but this appointment was made because of the vascular studies  I think predominantly through Dr. Kennon Holter office. 2/10; patient went back to see Dr. Gwenlyn Found. He did not feel given his comorbidities that he was a candidate for endovascular therapy. His wife seems to agree with that. He has ischemic wounds on his right toes particularly the right fifth toe fourth toe and first toe. He is not in a lot of pain unless these are manipulated 12/20/2020 on evaluation today patient appears to be doing well with regard to his feet all things considered. There does not appear to be any obvious signs of infection here. Most of the wounds are actually for the most part eschar and dry. With  that being said the patient is not actually a candidate for revascularization according to Dr. Gwenlyn Found who he has seen previous. Nonetheless I think that for the time being Betadine or something of that sort just keep things clean and dry will be a good way to go. 3/24; patient has 3 areas on the dorsal right toes first fourth and fifth that are necrotic probably ischemic. He was not felt to be a candidate for revascularization therapy. He also has an area on the left fourth toe in a similar situation. His significant other is concerned about possibility of an abscess on his buttock. He was put on antibiotics last time. I must say that when he was here before he seemed to have recurrent small abscesses but the purulence in this culture is negative I wondered about an epidermoid cyst although this area currently is not in the same spot 4/21; patient presents for his 1 month follow-up. He has chronic necrotic right toes. The affected toes include the first fourth and fifth digits. The third toe wound has closed. He uses Betadine on this daily. He has no complaints today. He denies fever chills, erythema to the wound sites. 5/19; patient presents for 1 month follow-up. He has had no issues in the past month with his bilateral toe wounds due to arterial insufficiency. He continues to use Betadine on  these daily. He has no complaints today. He denies signs of infection. 6/30; patient presents for 6-week follow-up. He denies any issues. He has been using Betadine to the areas of necrosis on the right toes. He denies signs of infection. 8/25; patient presents for 36-monthfollow-up. He states that 1 week ago he developed some thick drainage from the fourth toe near the toenail. Patient has been putting antibiotic ointment to this area. He denies increased warmth or erythema to the fourth toe. He denies systemic signs of infection. He continues to use Betadine to the other necrotic areas. Patient History Information obtained from Patient. Family History Unknown History. Social History Former smoker - quit 12 years ago, Marital Status - Divorced, Alcohol Use - Never, Drug Use - No History, Caffeine Use - Rarely. Medical History Cardiovascular Patient has history of Congestive Heart Failure, Coronary Artery Disease, Hypertension Endocrine Patient has history of Type II Diabetes Denies history of Type I Diabetes Neurologic Patient has history of Seizure Disorder Oncologic Denies history of Received Chemotherapy, Received Radiation Medical A Surgical History Notes nd Cardiovascular CVA, Carotid stenosis Genitourinary CKD stage 3 Neurologic Parkinson's, left hemiparesis, CVA Oncologic prostate cancer with mets to bone, bladder cancer Objective Constitutional respirations regular, non-labored and within target range for patient.. Vitals Time Taken: 2:45 PM, Height: 64 in, Weight: 150 lbs, BMI: 25.7, Temperature: 98.4 F, Pulse: 71 bpm, Respiratory Rate: 16 breaths/min, Blood Pressure: 153/75 mmHg, Capillary Blood Glucose: 134 mg/dl. General Notes: glucose per pt report Psychiatric pleasant and cooperative. General Notes: Right foot: He has eschar to the dorsal first and fourth toes. I am unable to express drainage however the intake nurse was able to get some scant thick yellow  drainage. No increased warmth or erythema to the fourth toe. No more eschar to the fifth toe. Foot and leg are warm. Integumentary (Hair, Skin) Wound #4 status is Open. Original cause of wound was Not Known. The date acquired was: 10/11/2020. The wound has been in treatment 33 weeks. The wound is located on the Right [name] Great. The wound measures 1cm length x 1cm width x 0.2cm depth; 0.785cm^2 area and  0.157cm^3 volume. There is bone and Fat oe Layer (Subcutaneous Tissue) exposed. There is no tunneling or undermining noted. There is a none present amount of drainage noted. The wound margin is flat and intact. There is no granulation within the wound bed. There is a large (67-100%) amount of necrotic tissue within the wound bed including Eschar. Wound #5 status is Healed - Epithelialized. Original cause of wound was Not Known. The date acquired was: 10/11/2020. The wound has been in treatment 33 weeks. The wound is located on the Right [name] Fifth. The wound measures 0cm length x 0cm width x 0cm depth; 0cm^2 area and 0cm^3 volume. There is no oe tunneling or undermining noted. There is a none present amount of drainage noted. The wound margin is distinct with the outline attached to the wound base. There is no granulation within the wound bed. There is no necrotic tissue within the wound bed. Wound #6 status is Open. Original cause of wound was Gradually Appeared. The date acquired was: 11/22/2020. The wound has been in treatment 28 weeks. The wound is located on the Right [name] Fourth. The wound measures 1.1cm length x 1.7cm width x 0.1cm depth; 1.469cm^2 area and 0.147cm^3 volume. There oe is Fat Layer (Subcutaneous Tissue) exposed. There is no tunneling or undermining noted. There is a small amount of purulent drainage noted. The wound margin is distinct with the outline attached to the wound base. There is no granulation within the wound bed. There is a large (67-100%) amount of necrotic tissue within the  wound bed including Eschar. 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 The first toe looks improved. The fourth toe continues to have necrotic tissue with some reported thick yellow drainage that has improved over the last week. No increased warmth or erythema to the toe. For the remaining necrotic areas I recommended continuing Betadine. I would like to give the patient a course of antibiotics since he has severe peripheral arterial disease and dry gangrene. He is at high risk for poor outcome if he continues to have an infection. I will do a week of Bactrim. Plan Follow-up Appointments: Return Appointment in 1 week. - with Dr. Heber [place] Bathing/ Shower/ Hygiene: May shower with protection but do not get wound dressing(s) wet. Edema Control - Lymphedema / SCD / Other: Moisturize legs daily. Off-Loading: Low air-loss mattress (Group 2) Turn and reposition every 2 hours Other: - CONTINUE TO USE BUNNY BOOTS FOR HEEL PROTECTION. UP IN WHEELCHAIR NO MORE THAT 2 HOUR INCREMENTS. Ensure to relieve pressure off buttock closed areas to prevent reopening. Home Health: No change in wound care orders this week; continue Home Health for wound care. May utilize formulary equivalent dressing for wound treatment orders unless otherwise specified. Other Home Health Orders/Instructions: - [facility] The following medication(s) was prescribed: Bactrim DS oral 800 mg-160 mg tablet 1 1 tablet twice daily for 7 days starting 06/06/2021 WOUND #4: - [name] Great Wound Laterality: Right oe Cleanser: Wound Cleanser (Home Health) 1 x Per Day/30 Days Discharge Instructions: Cleanse the wound with  wound cleanser prior to applying a clean dressing using gauze sponges, not tissue or cotton balls. Prim Dressing: Betadine 1 x Per Day/30 Days ary Discharge Instructions: apply betadine to wound.  Secondary Dressing: Woven Gauze Sponges 2x2 in (Home Health) 1 x Per Day/30 Days Discharge Instructions: Apply over primary dressing as directed. Secured With: Child psychotherapist, Sterile 2x75 (in/in) (Home Health) 1 x Per Day/30 Days Discharge Instructions: Secure with stretch gauze as directed. WOUND #6: - [name] Fourth Wound Laterality: Right oe Cleanser: Wound Cleanser (Home Health) 1 x Per Day/30 Days Discharge Instructions: Cleanse the wound with wound cleanser prior to applying a clean dressing using gauze sponges, not tissue or cotton balls. Prim Dressing: Betadine 1 x Per Day/30 Days ary Discharge Instructions: apply betadine to wound. Secondary Dressing: Woven Gauze Sponges 2x2 in (Home Health) 1 x Per Day/30 Days Discharge Instructions: Apply over primary dressing as directed. Secured With: Child psychotherapist, Sterile 2x75 (in/in) (Home Health) 1 x Per Day/30 Days Discharge Instructions: Secure with stretch gauze as directed. 1. Bactrim 2. Follow-up in 1 week 3. Betadine to all necrotic areas Electronic Signature(s) Signed: 06/06/2021 3:33:31 PM By: Kalman Shan DO Signed: 06/06/2021 3:33:31 PM By: Kalman Shan DO Entered By: Kalman Shan on 06/06/2021 15:32:51 -------------------------------------------------------------------------------- HxROS Details Patient Name: Date of Service: [name]. 06/06/2021 2:30 PM Medical Record Number: [id number] Patient Account Number: [id number] Date of Birth/Sex: Treating RN: 08/27/1946 (75 y.o. [name], [name] Primary Care Provider: [name] Other Clinician: Referring Provider: Treating Provider/Extender: Theodosia Quay in Treatment: 26 Information Obtained  From Patient Cardiovascular Medical History: Positive for: Congestive Heart Failure; Coronary Artery Disease; Hypertension Past Medical History Notes: CVA, Carotid stenosis Endocrine Medical History: Positive for: Type II Diabetes Negative for: Type I Diabetes Time with diabetes: 5 years ago Treated with: Insulin Blood sugar tested every day: Yes Tested : once a day Genitourinary Medical History: Past Medical History Notes: CKD stage 3 Neurologic Medical History: Positive for: Seizure Disorder Past Medical History Notes: Parkinson's, left hemiparesis, CVA Oncologic Medical History: Negative for: Received Chemotherapy; Received Radiation Past Medical History Notes: prostate cancer with mets to bone, bladder cancer Immunizations Pneumococcal Vaccine: Received Pneumococcal Vaccination: Yes Received Pneumococcal Vaccination On or After 60th Birthday: No Implantable Devices None Family and Social History Unknown History: Yes; Former smoker - quit 12 years ago; Marital Status - Divorced; Alcohol Use: Never; Drug Use: No History; Caffeine Use: Rarely; Financial Concerns: No; Food, Clothing or Shelter Needs: No; Support System Lacking: No; Transportation Concerns: No Electronic Signature(s) Signed: 06/06/2021 3:33:31 PM By: Kalman Shan DO Signed: 06/11/2021 5:24:19 PM By: Rhae Hammock RN Entered By: Kalman Shan on 06/06/2021 15:24:24 -------------------------------------------------------------------------------- SuperBill Details Patient Name: Date of Service: Luis Patten [name]. 06/06/2021 Medical Record Number: [id number] Patient Account Number: [id number] Date of Birth/Sex: Treating RN: 1945-11-20 (75 y.o. [name] Primary Care Provider: [name] Other Clinician: Referring Provider: Treating Provider/Extender: Theodosia Quay in Treatment: 44 Diagnosis Coding ICD-10 Codes Code Description [phone] Non-pressure  chronic ulcer of other part of left foot with other specified severity L97.514 Non-pressure chronic ulcer of other part of right foot with necrosis of bone E11.51 Type 2 diabetes mellitus with diabetic peripheral angiopathy without gangrene Facility Procedures CPT4 Code: [id number] Description: 99214 - WOUND CARE VISIT-LEV 4 EST PT Modifier: Quantity: 1 Physician Procedures : CPT4 Code Description Modifier [id number] - WC PHYS LEVEL 4 - EST PT ICD-10 Diagnosis Description L97.528 Non-pressure chronic ulcer of other part of left foot with other specified severity L97.514 Non-pressure chronic ulcer of other part of right  foot with necrosis of bone E11.51 Type 2 diabetes mellitus with diabetic  peripheral angiopathy without gangrene Quantity: 1 Electronic Signature(s) Signed: 06/06/2021 3:33:31 PM By: Kalman Shan DO Entered By: Kalman Shan on 06/06/2021 15:33:13

## 2021-06-11 NOTE — Progress Notes (Signed)
MORSE, BRUEGGEMANN T ([id number]) . Visit Report for 06/06/2021 Arrival Information Details Patient Name: Date of Service: [name], [name] [facility] T. 06/06/2021 2:30 PM Medical Record Number: [id number] Patient Account Number: [id number] Date of Birth/Sex: Treating RN: Aug 19, 1946 (75 y.o. [name] Primary Care Aliena Ghrist: [name] Other Clinician: Referring Andyn Sales: Treating Theora Vankirk/Extender: Theodosia Quay in Treatment: 62 Visit Information History Since Last Visit Added or deleted any medications: No Patient Arrived: Wheel Chair Any new allergies or adverse reactions: No Arrival Time: 14:45 Had a fall or experienced change in No Accompanied By: spouse activities of daily living that may affect Transfer Assistance: None risk of falls: Patient Identification Verified: Yes Signs or symptoms of abuse/neglect since last visito No Secondary Verification Process Completed: Yes Hospitalized since last visit: No Patient Requires Transmission-Based Precautions: No Implantable device outside of the clinic excluding No Patient Has Alerts: Yes cellular tissue based products placed in the center Patient Alerts: Patient on Blood Thinner since last visit: Has Dressing in Place as Prescribed: Yes Pain Present Now: No Electronic Signature(s) Signed: 06/06/2021 4:57:56 PM By: Levan Hurst RN, BSN Entered By: Levan Hurst on 06/06/2021 15:03:10 -------------------------------------------------------------------------------- Clinic Level of Care Assessment Details Patient Name: Date of Service: [name] [facility] T. 06/06/2021 2:30 PM Medical Record Number: [id number] Patient Account Number: [id number] Date of Birth/Sex: Treating RN: [birth year] (75 y.o. [name] Primary Care Anh Bigos: [name] Other Clinician: Referring Shree Espey: Treating Velena Keegan/Extender: Theodosia Quay in Treatment: 74 Clinic Level of Care Assessment  Items TOOL 4 Quantity Score X- 1 0 Use when only an EandM is performed on FOLLOW-UP visit ASSESSMENTS - Nursing Assessment / Reassessment X- 1 10 Reassessment of Co-morbidities (includes updates in patient status) X- 1 5 Reassessment of Adherence to Treatment Plan ASSESSMENTS - Wound and Skin A ssessment / Reassessment _0  - 0 Simple Wound Assessment / Reassessment - one wound X- 3 5 Complex Wound Assessment / Reassessment - multiple wounds _1  - 0 Dermatologic / Skin Assessment (not related to wound area) ASSESSMENTS - Focused Assessment _2  - 0 Circumferential Edema Measurements - multi extremities _3  - 0 Nutritional Assessment / Counseling / Intervention X- 1 5 Lower Extremity Assessment (monofilament, tuning fork, pulses) _4  - 0 Peripheral Arterial Disease Assessment (using hand held doppler) ASSESSMENTS - Ostomy and/or Continence Assessment and Care _5  - 0 Incontinence Assessment and Management _6  - 0 Ostomy Care Assessment and Management (repouching, etc.) PROCESS - Coordination of Care X - Simple Patient / Family Education for ongoing care 1 15 _7  - 0 Complex (extensive) Patient / Family Education for ongoing care X- 1 10 Staff obtains Programmer, systems, Records, T Results / Process Orders est X- 1 10 Staff telephones HHA, Nursing Homes / Clarify orders / etc _8  - 0 Routine Transfer to another Facility (non-emergent condition) _9  - 0 Routine Hospital Admission (non-emergent condition) _10  - 0 New Admissions / Biomedical engineer / Ordering NPWT Apligraf, etc. , _11  - 0 Emergency Hospital Admission (emergent condition) X- 1 10 Simple Discharge Coordination _12  - 0 Complex (extensive) Discharge Coordination PROCESS - Special Needs _13  - 0 Pediatric / Minor Patient Management _14  - 0 Isolation Patient Management _15  - 0 Hearing / Language / Visual special needs _16  - 0 Assessment of Community assistance (transportation, D/C planning, etc.) _17  - 0 Additional  assistance / Altered mentation _18  - 0 Support Surface(s) Assessment (bed, cushion, seat, etc.) INTERVENTIONS - Wound Cleansing / Measurement _19  - 0 Simple Wound Cleansing - one wound X- 3 5  Complex Wound Cleansing - multiple wounds X- 1 5 Wound Imaging (photographs - any number of wounds) _0  - 0 Wound Tracing (instead of photographs) _1  - 0 Simple Wound Measurement - one wound X- 3 5 Complex Wound Measurement - multiple wounds INTERVENTIONS - Wound Dressings X - Small Wound Dressing one or multiple wounds 2 10 _2  - 0 Medium Wound Dressing one or multiple wounds _3  - 0 Large Wound Dressing one or multiple wounds X- 1 5 Application of Medications - topical <[id number]>_5<\/[id number]>_7  - 0 Application of Medications - injection INTERVENTIONS - Miscellaneous _5  - 0 External ear exam _6  - 0 Specimen Collection (cultures, biopsies, blood, body fluids, etc.) _7  - 0 Specimen(s) / Culture(s) sent or taken to Lab for analysis _8  - 0 Patient Transfer (multiple staff / Civil Service fast streamer / Similar devices) _9  - 0 Simple Staple / Suture removal (25 or less) _10  - 0 Complex Staple / Suture removal (26 or more) _11  - 0 Hypo / Hyperglycemic Management (close monitor of Blood Glucose) _12  - 0 Ankle / Brachial Index (ABI) - do not check if billed separately X- 1 5 Vital Signs Has the patient been seen at the hospital within the last three years: Yes Total Score: 145 Level Of Care: New/Established - Level 4 Electronic Signature(s) Signed: 06/06/2021 4:57:56 PM By: Levan Hurst RN, BSN Entered By: Levan Hurst on 06/06/2021 15:15:08 -------------------------------------------------------------------------------- Encounter Discharge Information Details Patient Name: Date of Service: [name] HN T. 06/06/2021 2:30 PM Medical Record Number: [id number] Patient Account Number: [id number] Date of Birth/Sex: Treating RN: 06-29-1946 (75 y.o. [name] Primary Care Kyng Matlock: [name] Other  Clinician: Referring Deon Duer: Treating Margerite Impastato/Extender: Theodosia Quay in Treatment: 14 Encounter Discharge Information Items Discharge Condition: Stable Ambulatory Status: Wheelchair Discharge Destination: Home Transportation: Private Auto Accompanied By: spouse Schedule Follow-up Appointment: Yes Clinical Summary of Care: Patient Declined Electronic Signature(s) Signed: 06/06/2021 4:57:56 PM By: Levan Hurst RN, BSN Entered By: Levan Hurst on 06/06/2021 15:16:17 -------------------------------------------------------------------------------- Lower Extremity Assessment Details Patient Name: Date of Service: [name] HN T. 06/06/2021 2:30 PM Medical Record Number: [id number] Patient Account Number: [id number] Date of Birth/Sex: Treating RN: [birth year] (75 y.o. [name] Primary Care Annarae Macnair: [name] Other Clinician: Referring Sydney Hasten: Treating Latria Mccarron/Extender: Edgardo Roys Weeks in Treatment: 44 Edema Assessment Assessed: Shirlyn Goltz: No] Patrice Paradise: No] Edema: [Left: N] [Right: o] Calf Left: Right: Point of Measurement: 27 cm From Medial Instep 23 cm 25 cm Ankle Left: Right: Point of Measurement: 9 cm From Medial Instep 18 cm 17 cm Electronic Signature(s) Signed: 06/06/2021 4:57:56 PM By: Levan Hurst RN, BSN Entered By: Levan Hurst on 06/06/2021 15:04:11 -------------------------------------------------------------------------------- Multi Wound Chart Details Patient Name: Date of Service: [name] HN T. 06/06/2021 2:30 PM Medical Record Number: [id number] Patient Account Number: [id number] Date of Birth/Sex: Treating RN: [birth year] (75 y.o. [name], Lauren Primary Care Makala Fetterolf: [name] Other Clinician: Referring Hailey Stormer: Treating Jahliyah Trice/Extender: Theodosia Quay in Treatment: 76 Vital Signs Height(in): 64 Capillary Blood Glucose(mg/dl):  134 Weight(lbs): 150 Pulse(bpm): 68 Body Mass Index(BMI): 26 Blood Pressure(mmHg): 153/75 Temperature(F): 98.4 Respiratory Rate(breaths/min): 16 Photos: Right T Great oe Right T Fifth oe Right T Fourth oe Wound Location: Not Known Not Known Gradually Appeared Wounding Event: Diabetic Wound/Ulcer of the Lower Diabetic Wound/Ulcer of the Lower Diabetic Wound/Ulcer of the Lower Primary Etiology: Extremity Extremity Extremity Congestive Heart Failure, Coronary Congestive Heart Failure, Coronary Congestive Heart Failure, Coronary Comorbid History: Artery Disease, Hypertension, Type II Artery Disease, Hypertension, Type  II Artery Disease, Hypertension, Type II Diabetes, Seizure Disorder Diabetes, Seizure Disorder Diabetes, Seizure Disorder 10/11/2020 10/11/2020 11/22/2020 Date Acquired: 66 33 28 Weeks of Treatment: Open Healed - Epithelialized Open Wound Status: 1x1x0.2 0x0x0 1.1x1.7x0.1 Measurements L x W x D (cm) 0.785 0 1.469 A (cm) : rea 0.157 0 0.147 Volume (cm) : 28.60% 100.00% -211.90% % Reduction in A rea: -42.70% 100.00% -212.80% % Reduction in Volume: Grade 2 Grade 2 Grade 2 Classification: None Present None Present Small Exudate A mount: N/A N/A Purulent Exudate Type: N/A N/A yellow, brown, green Exudate Color: Flat and Intact Distinct, outline attached Distinct, outline attached Wound Margin: None Present (0%) None Present (0%) None Present (0%) Granulation A mount: Large (67-100%) None Present (0%) Large (67-100%) Necrotic A mount: Eschar N/A Eschar Necrotic Tissue: Fat Layer (Subcutaneous Tissue): Yes Fascia: No Fat Layer (Subcutaneous Tissue): Yes Exposed Structures: Bone: Yes Fat Layer (Subcutaneous Tissue): No Fascia: No Fascia: No Tendon: No Tendon: No Tendon: No Muscle: No Muscle: No Muscle: No Joint: No Joint: No Joint: No Bone: No Bone: No Small (1-33%) Large (67-100%) None Epithelialization: Treatment Notes Wound #4 (Toe  Great) Wound Laterality: Right Cleanser Wound Cleanser Discharge Instruction: Cleanse the wound with wound cleanser prior to applying a clean dressing using gauze sponges, not tissue or cotton balls. Peri-Wound Care Topical Primary Dressing Betadine Discharge Instruction: apply betadine to wound. Secondary Dressing Woven Gauze Sponges 2x2 in Discharge Instruction: Apply over primary dressing as directed. Secured With Conforming Stretch Gauze Bandage, Sterile 2x75 (in/in) Discharge Instruction: Secure with stretch gauze as directed. Compression Wrap Compression Stockings Add-Ons Wound #6 (Toe Fourth) Wound Laterality: Right Cleanser Wound Cleanser Discharge Instruction: Cleanse the wound with wound cleanser prior to applying a clean dressing using gauze sponges, not tissue or cotton balls. Peri-Wound Care Topical Primary Dressing Betadine Discharge Instruction: apply betadine to wound. Secondary Dressing Woven Gauze Sponges 2x2 in Discharge Instruction: Apply over primary dressing as directed. Secured With Conforming Stretch Gauze Bandage, Sterile 2x75 (in/in) Discharge Instruction: Secure with stretch gauze as directed. Compression Wrap Compression Stockings Add-Ons Electronic Signature(s) Signed: 06/06/2021 3:33:31 PM By: Kalman Shan DO Signed: 06/11/2021 5:24:19 PM By: Rhae Hammock RN Entered By: Kalman Shan on 06/06/2021 15:22:13 -------------------------------------------------------------------------------- Multi-Disciplinary Care Plan Details Patient Name: Date of Service: [name] HN T. 06/06/2021 2:30 PM Medical Record Number: [id number] Patient Account Number: [id number] Date of Birth/Sex: Treating RN: Jul 12, 1946 (75 y.o. [name] Primary Care Milcah Dulany: [name] Other Clinician: Referring Larysa Pall: Treating Levie Owensby/Extender: Theodosia Quay in Treatment: [place] reviewed with  physician Active Inactive Wound/Skin Impairment Nursing Diagnoses: Impaired tissue integrity Knowledge deficit related to ulceration/compromised skin integrity Goals: Patient/caregiver will verbalize understanding of skin care regimen Date Initiated: 07/27/2020 Target Resolution Date: 07/05/2021 Goal Status: Active Ulcer/skin breakdown will have a volume reduction of 30% by week 4 Date Initiated: 07/27/2020 Date Inactivated: 09/13/2020 Target Resolution Date: 08/24/2020 Goal Status: Met Interventions: Assess patient/caregiver ability to obtain necessary supplies Assess patient/caregiver ability to perform ulcer/skin care regimen upon admission and as needed Assess ulceration(s) every visit Provide education on ulcer and skin care Treatment Activities: Skin care regimen initiated : 07/27/2020 Topical wound management initiated : 07/27/2020 Notes: Electronic Signature(s) Signed: 06/06/2021 4:57:56 PM By: Levan Hurst RN, BSN Entered By: Levan Hurst on 06/06/2021 15:10:15 -------------------------------------------------------------------------------- Pain Assessment Details Patient Name: Date of Service: [name] HN T. 06/06/2021 2:30 PM Medical Record Number: [id number] Patient Account Number: [id number] Date of Birth/Sex: Treating RN: [birth year] (75 y.o. Jonette Eva,  [place] Primary Care Khadeejah Castner: [name] Other Clinician: Referring [place] Liz: Treating Shuntel Fishburn/Extender: Theodosia Quay in Treatment: 18 Active Problems Location of Pain Severity and Description of Pain Patient Has Paino No Site Locations Pain Management and Medication Current Pain Management: Electronic Signature(s) Signed: 06/06/2021 4:57:56 PM By: Levan Hurst RN, BSN Entered By: Levan Hurst on 06/06/2021 15:04:06 -------------------------------------------------------------------------------- Patient/Caregiver Education Details Patient Name: Date of  Service: Julieta Gutting 8/25/2022andnbsp2:30 PM Medical Record Number: [id number] Patient Account Number: [id number] Date of Birth/Gender: Treating RN: 03/13/1946 (75 y.o. [name] Primary Care Physician: [name] Other Clinician: Referring Physician: Treating Physician/Extender: Theodosia Quay in Treatment: 26 Education Assessment Education Provided To: Patient Education Topics Provided Wound/Skin Impairment: Methods: Explain/Verbal Responses: State content correctly [employer]) Signed: 06/06/2021 4:57:56 PM By: Levan Hurst RN, BSN Entered By: Levan Hurst on 06/06/2021 15:10:38 -------------------------------------------------------------------------------- Wound Assessment Details Patient Name: Date of Service: [name] HN T. 06/06/2021 2:30 PM Medical Record Number: [id number] Patient Account Number: [id number] Date of Birth/Sex: Treating RN: 16-Jun-1946 (75 y.o. [name], Lauren Primary Care Teagan Ozawa: [name] Other Clinician: Referring Nitika Jackowski: Treating Keiasia Christianson/Extender: Theodosia Quay in Treatment: 64 Wound Status Wound Number: 4 Primary Diabetic Wound/Ulcer of the Lower Extremity Etiology: Wound Location: Right T Great oe Wound Open Wounding Event: Not Known Status: Date Acquired: 10/11/2020 Comorbid Congestive Heart Failure, Coronary Artery Disease, Weeks Of Treatment: 33 History: Hypertension, Type II Diabetes, Seizure Disorder Clustered Wound: No Photos Wound Measurements Length: (cm) 1 Width: (cm) 1 Depth: (cm) 0.2 Area: (cm) 0.785 Volume: (cm) 0.157 % Reduction in Area: 28.6% % Reduction in Volume: -42.7% Epithelialization: Small (1-33%) Tunneling: No Undermining: No Wound Description Classification: Grade 2 Wound Margin: Flat and Intact Exudate Amount: None Present Foul Odor After Cleansing: No Slough/Fibrino Yes Wound Bed Granulation  Amount: None Present (0%) Exposed Structure Necrotic Amount: Large (67-100%) Fascia Exposed: No Necrotic Quality: Eschar Fat Layer (Subcutaneous Tissue) Exposed: Yes Tendon Exposed: No Muscle Exposed: No Joint Exposed: No Bone Exposed: Yes Treatment Notes Wound #4 (Toe Great) Wound Laterality: Right Cleanser Wound Cleanser Discharge Instruction: Cleanse the wound with wound cleanser prior to applying a clean dressing using gauze sponges, not tissue or cotton balls. Peri-Wound Care Topical Primary Dressing Betadine Discharge Instruction: apply betadine to wound. Secondary Dressing Woven Gauze Sponges 2x2 in Discharge Instruction: Apply over primary dressing as directed. Secured With Conforming Stretch Gauze Bandage, Sterile 2x75 (in/in) Discharge Instruction: Secure with stretch gauze as directed. Compression Wrap Compression Stockings Add-Ons Electronic Signature(s) Signed: 06/10/2021 3:13:54 PM By: Sandre Kitty Signed: 06/11/2021 5:24:19 PM By: Rhae Hammock RN Entered By: Sandre Kitty on 06/06/2021 14:59:14 -------------------------------------------------------------------------------- Wound Assessment Details Patient Name: Date of Service: [name] HN T. 06/06/2021 2:30 PM Medical Record Number: [id number] Patient Account Number: [id number] Date of Birth/Sex: Treating RN: [birth year] (75 y.o. [name], Lauren Primary Care Rafan Sanders: [name] Other Clinician: Referring Ghazi Rumpf: Treating Mariabella Nilsen/Extender: Theodosia Quay in Treatment: 76 Wound Status Wound Number: 5 Primary Diabetic Wound/Ulcer of the Lower Extremity Etiology: Wound Location: Right T Fifth oe Wound Healed - Epithelialized Wounding Event: Not Known Status: Date Acquired: 10/11/2020 Comorbid Congestive Heart Failure, Coronary Artery Disease, Weeks Of Treatment: 33 History: Hypertension, Type II Diabetes, Seizure Disorder Clustered Wound:  No Photos Wound Measurements Length: (cm) Width: (cm) Depth: (cm) Area: (cm) Volume: (cm) 0 % Reduction in Area: 100% 0 % Reduction in Volume: 100% 0 Epithelialization: Large (67-100%) 0 Tunneling: No 0 Undermining: No Wound Description Classification:  Grade 2 Wound Margin: Distinct, outline attached Exudate Amount: None Present Foul Odor After Cleansing: No Slough/Fibrino No Wound Bed Granulation Amount: None Present (0%) Exposed Structure Necrotic Amount: None Present (0%) Fascia Exposed: No Fat Layer (Subcutaneous Tissue) Exposed: No Tendon Exposed: No Muscle Exposed: No Joint Exposed: No Bone Exposed: No Electronic Signature(s) Signed: 06/06/2021 4:57:56 PM By: Levan Hurst RN, BSN Signed: 06/11/2021 5:24:19 PM By: Rhae Hammock RN Entered By: Levan Hurst on 06/06/2021 15:09:03 -------------------------------------------------------------------------------- Wound Assessment Details Patient Name: Date of Service: [name] HN T. 06/06/2021 2:30 PM Medical Record Number: [id number] Patient Account Number: [id number] Date of Birth/Sex: Treating RN: [birth year] (75 y.o. [name], Lauren Primary Care Perl Folmar: [name] Other Clinician: Referring Dajha Urquilla: Treating Athalene Kolle/Extender: Theodosia Quay in Treatment: 37 Wound Status Wound Number: 6 Primary Diabetic Wound/Ulcer of the Lower Extremity Etiology: Wound Location: Right T Fourth oe Wound Open Wounding Event: Gradually Appeared Status: Date Acquired: 11/22/2020 Comorbid Congestive Heart Failure, Coronary Artery Disease, Weeks Of Treatment: 28 History: Hypertension, Type II Diabetes, Seizure Disorder Clustered Wound: No Photos Wound Measurements Length: (cm) 1.1 Width: (cm) 1.7 Depth: (cm) 0.1 Area: (cm) 1.469 Volume: (cm) 0.147 % Reduction in Area: -211.9% % Reduction in Volume: -212.8% Epithelialization: None Tunneling: No Undermining: No Wound  Description Classification: Grade 2 Wound Margin: Distinct, outline attached Exudate Amount: Small Exudate Type: Purulent Exudate Color: yellow, brown, green Foul Odor After Cleansing: No Slough/Fibrino No Wound Bed Granulation Amount: None Present (0%) Exposed Structure Necrotic Amount: Large (67-100%) Fascia Exposed: No Necrotic Quality: Eschar Fat Layer (Subcutaneous Tissue) Exposed: Yes Tendon Exposed: No Muscle Exposed: No Joint Exposed: No Bone Exposed: No Treatment Notes Wound #6 (Toe Fourth) Wound Laterality: Right Cleanser Wound Cleanser Discharge Instruction: Cleanse the wound with wound cleanser prior to applying a clean dressing using gauze sponges, not tissue or cotton balls. Peri-Wound Care Topical Primary Dressing Betadine Discharge Instruction: apply betadine to wound. Secondary Dressing Woven Gauze Sponges 2x2 in Discharge Instruction: Apply over primary dressing as directed. Secured With Conforming Stretch Gauze Bandage, Sterile 2x75 (in/in) Discharge Instruction: Secure with stretch gauze as directed. Compression Wrap Compression Stockings Add-Ons Electronic Signature(s) Signed: 06/10/2021 3:13:54 PM By: Sandre Kitty Signed: 06/11/2021 5:24:19 PM By: Rhae Hammock RN Entered By: Sandre Kitty on 06/06/2021 15:02:11 -------------------------------------------------------------------------------- Vitals Details Patient Name: Date of Service: [name] HN T. 06/06/2021 2:30 PM Medical Record Number: [id number] Patient Account Number: [id number] Date of Birth/Sex: Treating RN: 1945/11/08 (75 y.o. [name] Primary Care Edelmiro Innocent: [name] Other Clinician: Referring Jalaiyah Throgmorton: Treating Kellis Mcadam/Extender: Theodosia Quay in Treatment: 36 Vital Signs Time Taken: 14:45 Temperature (F): 98.4 Height (in): 64 Pulse (bpm): 71 Weight (lbs): 150 Respiratory Rate (breaths/min): 16 Body Mass Index (BMI):  25.7 Blood Pressure (mmHg): 153/75 Capillary Blood Glucose (mg/dl): 134 Reference Range: 80 - 120 mg / dl Notes glucose per pt report Electronic Signature(s) Signed: 06/06/2021 4:57:56 PM By: Levan Hurst RN, BSN Entered By: Levan Hurst on 06/06/2021 15:03:34

## 2021-06-12 DIAGNOSIS — N1832 Chronic kidney disease, stage 3b: Secondary | ICD-10-CM | POA: Diagnosis not present

## 2021-06-12 DIAGNOSIS — L89892 Pressure ulcer of other site, stage 2: Secondary | ICD-10-CM | POA: Diagnosis not present

## 2021-06-12 DIAGNOSIS — I69354 Hemiplegia and hemiparesis following cerebral infarction affecting left non-dominant side: Secondary | ICD-10-CM | POA: Diagnosis not present

## 2021-06-12 DIAGNOSIS — E1122 Type 2 diabetes mellitus with diabetic chronic kidney disease: Secondary | ICD-10-CM | POA: Diagnosis not present

## 2021-06-12 DIAGNOSIS — S90411D Abrasion, right great toe, subsequent encounter: Secondary | ICD-10-CM | POA: Diagnosis not present

## 2021-06-12 DIAGNOSIS — G20 Parkinson's disease: Secondary | ICD-10-CM | POA: Diagnosis not present

## 2021-06-12 DIAGNOSIS — Z794 Long term (current) use of insulin: Secondary | ICD-10-CM | POA: Diagnosis not present

## 2021-06-12 DIAGNOSIS — I130 Hypertensive heart and chronic kidney disease with heart failure and stage 1 through stage 4 chronic kidney disease, or unspecified chronic kidney disease: Secondary | ICD-10-CM | POA: Diagnosis not present

## 2021-06-12 DIAGNOSIS — Z466 Encounter for fitting and adjustment of urinary device: Secondary | ICD-10-CM | POA: Diagnosis not present

## 2021-06-12 DIAGNOSIS — I5032 Chronic diastolic (congestive) heart failure: Secondary | ICD-10-CM | POA: Diagnosis not present

## 2021-06-13 ENCOUNTER — Other Ambulatory Visit: Payer: Self-pay | Admitting: Physical Medicine & Rehabilitation

## 2021-06-13 ENCOUNTER — Other Ambulatory Visit: Payer: Self-pay

## 2021-06-13 ENCOUNTER — Encounter (HOSPITAL_BASED_OUTPATIENT_CLINIC_OR_DEPARTMENT_OTHER): Payer: Medicare Other | Attending: Internal Medicine | Admitting: Internal Medicine

## 2021-06-13 DIAGNOSIS — L97514 Non-pressure chronic ulcer of other part of right foot with necrosis of bone: Secondary | ICD-10-CM | POA: Insufficient documentation

## 2021-06-13 DIAGNOSIS — G20 Parkinson's disease: Secondary | ICD-10-CM | POA: Diagnosis not present

## 2021-06-13 DIAGNOSIS — N183 Chronic kidney disease, stage 3 unspecified: Secondary | ICD-10-CM | POA: Insufficient documentation

## 2021-06-13 DIAGNOSIS — E11621 Type 2 diabetes mellitus with foot ulcer: Secondary | ICD-10-CM | POA: Diagnosis not present

## 2021-06-13 DIAGNOSIS — Z87891 Personal history of nicotine dependence: Secondary | ICD-10-CM | POA: Insufficient documentation

## 2021-06-13 DIAGNOSIS — E1151 Type 2 diabetes mellitus with diabetic peripheral angiopathy without gangrene: Secondary | ICD-10-CM | POA: Diagnosis not present

## 2021-06-13 DIAGNOSIS — I130 Hypertensive heart and chronic kidney disease with heart failure and stage 1 through stage 4 chronic kidney disease, or unspecified chronic kidney disease: Secondary | ICD-10-CM | POA: Diagnosis not present

## 2021-06-13 DIAGNOSIS — L97528 Non-pressure chronic ulcer of other part of left foot with other specified severity: Secondary | ICD-10-CM | POA: Insufficient documentation

## 2021-06-13 DIAGNOSIS — I5032 Chronic diastolic (congestive) heart failure: Secondary | ICD-10-CM | POA: Insufficient documentation

## 2021-06-13 DIAGNOSIS — L0231 Cutaneous abscess of buttock: Secondary | ICD-10-CM | POA: Insufficient documentation

## 2021-06-13 DIAGNOSIS — C61 Malignant neoplasm of prostate: Secondary | ICD-10-CM | POA: Insufficient documentation

## 2021-06-13 DIAGNOSIS — G40909 Epilepsy, unspecified, not intractable, without status epilepticus: Secondary | ICD-10-CM | POA: Insufficient documentation

## 2021-06-13 DIAGNOSIS — C7911 Secondary malignant neoplasm of bladder: Secondary | ICD-10-CM | POA: Diagnosis not present

## 2021-06-13 DIAGNOSIS — C7951 Secondary malignant neoplasm of bone: Secondary | ICD-10-CM | POA: Diagnosis not present

## 2021-06-13 DIAGNOSIS — E1122 Type 2 diabetes mellitus with diabetic chronic kidney disease: Secondary | ICD-10-CM | POA: Diagnosis not present

## 2021-06-13 NOTE — Progress Notes (Signed)
[name], [name] ([id number]) . Visit Report for 06/13/2021 Chief Complaint Document Details Patient Name: Date of Service: [name], [name] [facility] T. 06/13/2021 2:00 PM Medical Record Number: [id number] Patient Account Number: [id number] Date of Birth/Sex: Treating RN: 01-31-1946 (75 y.o. [name], [name] Primary Care Provider: [name] Other Clinician: Referring Provider: Treating Provider/Extender: Theodosia Quay in Treatment: 88 Information Obtained from: Patient Chief Complaint Bilateral toe wounds Electronic Signature(s) Signed: 06/13/2021 3:20:42 PM By: Kalman Shan DO Entered By: Kalman Shan on 06/13/2021 15:14:42 -------------------------------------------------------------------------------- HPI Details Patient Name: Date of Service: [name] 06/13/2021 2:00 PM Medical Record Number: [id number] Patient Account Number: [id number] Date of Birth/Sex: Treating RN: 25-Dec-1945 (75 y.o. [name], [name] Primary Care Provider: [name] Other Clinician: Referring Provider: Treating Provider/Extender: Theodosia Quay in Treatment: 31 History of Present Illness HPI Description: ADMISSION 07/27/2020 This is a 75 year old man who is accompanied by his wife (ex). Nevertheless she is his active caregiver. His most complicated features are remote CVA that left him with a left hemiparesis. He also has widely metastatic prostate cancer to bone which I think is contributed to increasing frailty this year and he is no longer ambulatory. He required hospitalization from 9/7 through 9/14 with respiratory failure seizures. During this time it was noted that he had an open area in the left buttock in close proximity to the gluteal cleft. His wife tells me that he has a history of perirectal abscesses. And she describes this area as starting as a painful swelling in late August certainly sounds like an abscess. When he is in  the hospital he required bedside IandD's but did not go to the OR. Has CT scan of the area showed soft tissue thickening in the perianal ulcer. At that point it was concerning for a fistula connection however a discrete abscess was not seen. Notable that he was hypoalbuminemic in the hospital but actually came out better with an albumin of 3.1 on 9/14 his wife says he is eating well. They are currently treating this with Santyl ointment and a backing wet-to-dry dressing. By description of his wife this is done quite nicely and there is certainly less debris over the wound surface. They already have a hospital bed with a level 3 pressure relief surface. The patient lies on his back when he is in bed especially at night although his wife is counseled him not to do this. They are starting sliding board transfers into his wheelchair I think with physical therapy. He has a Foley catheter in place Past medical history includes remote CVA, metastatic prostate cancer to large areas of his thoracic and lumbar spine and pelvis., Bilateral carotid artery stenosis, Parkinson's disease, type 2 diabetes, diastolic heart failure, CHF, hypertension and a recently discovered possible pancreatic mass in the head of the pancreas 08/16/2020 patient I admitted to the clinic 2-1/2 weeks ago. He had an abscess site on his left buttock that required an IandD while he was in the hospital in September. He has been using Santyl backing wet-to-dry. He is cared for her aerobically at home by his wife 2 concerning areas on his feet. A stage I pressure injury on the right heel this does not have an open wound. He also has on the lateral aspect of the left fourth toe a dry circular eschared wound. 12/2; monthly follow-up. Is a very disabled man who had an abscess in his left buttock and required an IandD. We have been using Santyl to  this area I changed to silver collagen last time he is here and this actually looks quite a bit  better The last time he was here his wife showed me an area on the left lateral fourth toe I think a pressure injury with his fifth toe. Been using Santyl here as well but not making much improvement As well she shows me he has an area on his right buttock which is very tiny pinpoint area but under illumination still obviously an open area. I wonder if this is more of a shear injury and transferring and a pressure injury per se 12/16; this is a very disabled man who has a left buttock area that required an IandD apparently an abscess. Since he has come here that he developed a area on the right buttock. His wife says that this is draining pus. Finally he has an area on the lateral part of the left fourth toe. We have been using silver collagen on the buttock and Iodoflex on the fourth toe 1/6; this is a disabled man we have been working on a left buttock area that was initially an IandD abscess injury. We have got this down to a small area in fact it appears to be epithelializing towards the base of the wound. When he was here last time he had a small draining area on the right buttock. I thought this might be a small cyst culture of the drainage was negative. I do not think this was an abscess at all While he has been here he has been developing areas on the toes on his left foot. He initially had one on the left fourth toe and then last time the right first toe. He is a type II diabetic. With he has a history of coronary artery disease. He complains of a lot of pain in the right foot 1/13; we have been working on areas on his left buttock and then on the right buttock. Both of these are healed today albeit the area on the left has some depth with skin is gone into a divot. Nevertheless I think this is a reasonable outcome. He arrived a week ago with ischemic looking wounds on his right fifth, right first and his left fourth toe. He went on to have arterial studies which we received today these  showed an ABI that was noncompressible on both sides however the great toe on the right had a TBI of 0.14 with a pressure of only 17. Monophasic waveforms on all the tibial vessels and including the popliteal on the left he had monophasic waveforms again noncompressible vessels and no recorded toe pressure. Clearly these are ischemic wounds. Fortunately he is not complaining of a lot of pain The patient has a complicated medical situation which includes widely metastatic prostate cancer to bone for which she is on palliative care he also has Parkinson's disease. I have a recent note from his neurologist at which time he asked whether they wish to de-escalate the Parkinson's regimen and the answer was no. The patient has a follow-up appointment with Dr. Gwenlyn Found on 1/28. I believe he is the patient's cardiologist but this appointment was made because of the vascular studies I think predominantly through Dr. Kennon Holter office. 2/10; patient went back to see Dr. Gwenlyn Found. He did not feel given his comorbidities that he was a candidate for endovascular therapy. His wife seems to agree with that. He has ischemic wounds on his right toes particularly the right fifth toe fourth toe  and first toe. He is not in a lot of pain unless these are manipulated 12/20/2020 on evaluation today patient appears to be doing well with regard to his feet all things considered. There does not appear to be any obvious signs of infection here. Most of the wounds are actually for the most part eschar and dry. With that being said the patient is not actually a candidate for revascularization according to Dr. Gwenlyn Found who he has seen previous. Nonetheless I think that for the time being Betadine or something of that sort just keep things clean and dry will be a good way to go. 3/24; patient has 3 areas on the dorsal right toes first fourth and fifth that are necrotic probably ischemic. He was not felt to be a candidate for revascularization  therapy. He also has an area on the left fourth toe in a similar situation. His significant other is concerned about possibility of an abscess on his buttock. He was put on antibiotics last time. I must say that when he was here before he seemed to have recurrent small abscesses but the purulence in this culture is negative I wondered about an epidermoid cyst although this area currently is not in the same spot 4/21; patient presents for his 1 month follow-up. He has chronic necrotic right toes. The affected toes include the first fourth and fifth digits. The third toe wound has closed. He uses Betadine on this daily. He has no complaints today. He denies fever chills, erythema to the wound sites. 5/19; patient presents for 1 month follow-up. He has had no issues in the past month with his bilateral toe wounds due to arterial insufficiency. He continues to use Betadine on these daily. He has no complaints today. He denies signs of infection. 6/30; patient presents for 6-week follow-up. He denies any issues. He has been using Betadine to the areas of necrosis on the right toes. He denies signs of infection. 8/25; patient presents for 40-monthfollow-up. He states that 1 week ago he developed some thick drainage from the fourth toe near the toenail. Patient has been putting antibiotic ointment to this area. He denies increased warmth or erythema to the fourth toe. He denies systemic signs of infection. He continues to use Betadine to the other necrotic areas. 9/1; patient states he has taken Bactrim for the past week. He noticed yellow drainage until 2 days ago when it stopped. He denies increased warmth or erythema to the area. He denies systemic signs of infection. Electronic Signature(s) Signed: 06/13/2021 3:20:42 PM By: HKalman ShanDO Entered By: HKalman Shanon 06/13/2021 15:15:29 -------------------------------------------------------------------------------- Physical Exam  Details Patient Name: Date of Service: CIsla PenceHN T. 06/13/2021 2:00 PM Medical Record Number: [id number] Account Number: [id number] of Birth/Sex: Treating RN: [birth year](75y.o. MErie NoePrimary Care Provider: WJonathon JordanOther Clinician: Referring Provider: Treating Provider/Extender: HTheodosia Quayin Treatment: 45 Constitutional respirations regular, non-labored and within target range for patient..Marland KitchenPsychiatric pleasant and cooperative. Notes Right foot: He has eschar to the dorsal first and fourth toes. I am unable to express drainage. No increased warmth or erythema to the fourth toe. No more eschar to the fifth toe. Foot and leg are warm. Electronic Signature(s) Signed: 06/13/2021 3:20:42 PM By: HKalman ShanDO Entered By: HKalman Shanon 06/13/2021 15:17:37 -------------------------------------------------------------------------------- Physician Orders Details Patient Name: Date of Service: CIsla PenceHN T. 06/13/2021 2:00 PM Medical Record Number: [id number] Account Number: [id number] of Birth/Sex:  Treating RN: 05-Nov-1945 (75 y.o. [name], [name] Primary Care Provider: [name] Other Clinician: Referring Provider: Treating Provider/Extender: Theodosia Quay in Treatment: 60 Verbal / Phone Orders: No Diagnosis Coding ICD-10 Coding Code Description [phone] Non-pressure chronic ulcer of other part of left foot with other specified severity L97.514 Non-pressure chronic ulcer of other part of right foot with necrosis of bone E11.51 Type 2 diabetes mellitus with diabetic peripheral angiopathy without gangrene I73.9 Peripheral vascular disease, unspecified Follow-up Appointments ppointment in 1 week. - Dr. Heber Dana Return A Bathing/ Shower/ Hygiene May shower with protection but do not get wound dressing(s) wet. Edema Control - Lymphedema / SCD /  Other Moisturize legs daily. Off-Loading Low air-loss mattress (Group 2) Turn and reposition every 2 hours Other: - CONTINUE TO USE BUNNY BOOTS FOR HEEL PROTECTION. UP IN WHEELCHAIR NO MORE THAT 2 HOUR INCREMENTS. Ensure to relieve pressure off buttock closed areas to prevent reopening. Home Health No change in wound care orders this week; continue Home Health for wound care. May utilize formulary equivalent dressing for wound treatment orders unless otherwise specified. Other Home Health Orders/Instructions: - [facility] Wound Treatment Wound #4 - T Great oe Wound Laterality: Right Cleanser: Wound Cleanser (Home Health) 1 x Per Day/30 Days Discharge Instructions: Cleanse the wound with wound cleanser prior to applying a clean dressing using gauze sponges, not tissue or cotton balls. Prim Dressing: Betadine 1 x Per Day/30 Days ary Discharge Instructions: apply betadine to wound. Secondary Dressing: Woven Gauze Sponges 2x2 in (Home Health) 1 x Per Day/30 Days Discharge Instructions: Apply over primary dressing as directed. Secured With: Child psychotherapist, Sterile 2x75 (in/in) (Home Health) 1 x Per Day/30 Days Discharge Instructions: Secure with stretch gauze as directed. Wound #6 - T Fourth oe Wound Laterality: Right Cleanser: Wound Cleanser (Home Health) 1 x Per Day/30 Days Discharge Instructions: Cleanse the wound with wound cleanser prior to applying a clean dressing using gauze sponges, not tissue or cotton balls. Prim Dressing: Betadine 1 x Per Day/30 Days ary Discharge Instructions: apply betadine to wound. Secondary Dressing: Woven Gauze Sponges 2x2 in (Home Health) 1 x Per Day/30 Days Discharge Instructions: Apply over primary dressing as directed. Secured With: Child psychotherapist, Sterile 2x75 (in/in) (Home Health) 1 x Per Day/30 Days Discharge Instructions: Secure with stretch gauze as directed. Patient Medications llergies: Altace,  cephalexin A Notifications Medication Indication Start End 06/13/2021 Bactrim DS DOSE 1 - oral 800 mg-160 mg tablet - 1 tablet every 12 hours for 3 days Electronic Signature(s) Signed: 06/13/2021 3:20:42 PM By: Kalman Shan DO Previous Signature: 06/13/2021 3:03:46 PM Version By: Kalman Shan DO Entered By: Kalman Shan on 06/13/2021 15:17:52 -------------------------------------------------------------------------------- Problem List Details Patient Name: Date of Service: [name] 06/13/2021 2:00 PM Medical Record Number: [id number] Patient Account Number: [id number] Date of Birth/Sex: Treating RN: 1945/11/21 (75 y.o. [name], [name] Primary Care Provider: [name] Other Clinician: Referring Provider: Treating Provider/Extender: Theodosia Quay in Treatment: 45 Active Problems ICD-10 Encounter Code Description Active Date MDM Diagnosis L97.528 Non-pressure chronic ulcer of other part of left foot with other specified 08/16/2020 No Yes severity L97.514 Non-pressure chronic ulcer of other part of right foot with necrosis of bone 10/18/2020 No Yes E11.51 Type 2 diabetes mellitus with diabetic peripheral angiopathy without gangrene 10/18/2020 No Yes I73.9 Peripheral vascular disease, unspecified 06/06/2021 No Yes Inactive Problems ICD-10 Code Description Active Date Inactive Date L89.611 Pressure ulcer of right heel, stage 1 08/16/2020 08/16/2020 L89.312  Pressure ulcer of right buttock, stage 2 09/13/2020 09/13/2020 Resolved Problems ICD-10 Code Description Active Date Resolved Date L02.31 Cutaneous abscess of buttock 07/27/2020 07/27/2020 S31.829D Unspecified open wound of left buttock, subsequent encounter 07/27/2020 07/27/2020 Electronic Signature(s) Signed: 06/13/2021 3:20:42 PM By: Kalman Shan DO Entered By: Kalman Shan on 06/13/2021  15:14:27 -------------------------------------------------------------------------------- Progress Note Details Patient Name: Date of Service: [name] 06/13/2021 2:00 PM Medical Record Number: [id number] Patient Account Number: [id number] Date of Birth/Sex: Treating RN: 1946-09-05 (75 y.o. [name], [name] Primary Care Provider: [name] Other Clinician: Referring Provider: Treating Provider/Extender: Theodosia Quay in Treatment: 38 Subjective Chief Complaint Information obtained from Patient Bilateral toe wounds History of Present Illness (HPI) ADMISSION 07/27/2020 This is a 75 year old man who is accompanied by his wife (ex). Nevertheless she is his active caregiver. His most complicated features are remote CVA that left him with a left hemiparesis. He also has widely metastatic prostate cancer to bone which I think is contributed to increasing frailty this year and he is no longer ambulatory. He required hospitalization from 9/7 through 9/14 with respiratory failure seizures. During this time it was noted that he had an open area in the left buttock in close proximity to the gluteal cleft. His wife tells me that he has a history of perirectal abscesses. And she describes this area as starting as a painful swelling in late August certainly sounds like an abscess. When he is in the hospital he required bedside IandD's but did not go to the OR. Has CT scan of the area showed soft tissue thickening in the perianal ulcer. At that point it was concerning for a fistula connection however a discrete abscess was not seen. Notable that he was hypoalbuminemic in the hospital but actually came out better with an albumin of 3.1 on 9/14 his wife says he is eating well. They are currently treating this with Santyl ointment and a backing wet-to-dry dressing. By description of his wife this is done quite nicely and there is certainly less debris over the wound  surface. They already have a hospital bed with a level 3 pressure relief surface. The patient lies on his back when he is in bed especially at night although his wife is counseled him not to do this. They are starting sliding board transfers into his wheelchair I think with physical therapy. He has a Foley catheter in place Past medical history includes remote CVA, metastatic prostate cancer to large areas of his thoracic and lumbar spine and pelvis., Bilateral carotid artery stenosis, Parkinson's disease, type 2 diabetes, diastolic heart failure, CHF, hypertension and a recently discovered possible pancreatic mass in the head of the pancreas 08/16/2020 patient I admitted to the clinic 2-1/2 weeks ago. He had an abscess site on his left buttock that required an IandD while he was in the hospital in September. He has been using Santyl backing wet-to-dry. He is cared for her aerobically at home by his wife 2 concerning areas on his feet. A stage I pressure injury on the right heel this does not have an open wound. He also has on the lateral aspect of the left fourth toe a dry circular eschared wound. 12/2; monthly follow-up. Is a very disabled man who had an abscess in his left buttock and required an IandD. We have been using Santyl to this area I changed to silver collagen last time he is here and this actually looks quite a bit better ooThe last time he was  here his wife showed me an area on the left lateral fourth toe I think a pressure injury with his fifth toe. Been using Santyl here as well but not making much improvement ooAs well she shows me he has an area on his right buttock which is very tiny pinpoint area but under illumination still obviously an open area. I wonder if this is more of a shear injury and transferring and a pressure injury per se 12/16; this is a very disabled man who has a left buttock area that required an IandD apparently an abscess. Since he has come here that he  developed a area on the right buttock. His wife says that this is draining pus. Finally he has an area on the lateral part of the left fourth toe. We have been using silver collagen on the buttock and Iodoflex on the fourth toe 1/6; this is a disabled man we have been working on a left buttock area that was initially an IandD abscess injury. We have got this down to a small area in fact it appears to be epithelializing towards the base of the wound. When he was here last time he had a small draining area on the right buttock. I thought this might be a small cyst culture of the drainage was negative. I do not think this was an abscess at all While he has been here he has been developing areas on the toes on his left foot. He initially had one on the left fourth toe and then last time the right first toe. He is a type II diabetic. With he has a history of coronary artery disease. He complains of a lot of pain in the right foot 1/13; we have been working on areas on his left buttock and then on the right buttock. Both of these are healed today albeit the area on the left has some depth with skin is gone into a divot. Nevertheless I think this is a reasonable outcome. He arrived a week ago with ischemic looking wounds on his right fifth, right first and his left fourth toe. He went on to have arterial studies which we received today these showed an ABI that was noncompressible on both sides however the great toe on the right had a TBI of 0.14 with a pressure of only 17. Monophasic waveforms on all the tibial vessels and including the popliteal on the left he had monophasic waveforms again noncompressible vessels and no recorded toe pressure. Clearly these are ischemic wounds. Fortunately he is not complaining of a lot of pain The patient has a complicated medical situation which includes widely metastatic prostate cancer to bone for which she is on palliative care he also has Parkinson's disease. I have a  recent note from his neurologist at which time he asked whether they wish to de-escalate the Parkinson's regimen and the answer was no. The patient has a follow-up appointment with Dr. Gwenlyn Found on 1/28. I believe he is the patient's cardiologist but this appointment was made because of the vascular studies I think predominantly through Dr. Kennon Holter office. 2/10; patient went back to see Dr. Gwenlyn Found. He did not feel given his comorbidities that he was a candidate for endovascular therapy. His wife seems to agree with that. He has ischemic wounds on his right toes particularly the right fifth toe fourth toe and first toe. He is not in a lot of pain unless these are manipulated 12/20/2020 on evaluation today patient appears to be doing well  with regard to his feet all things considered. There does not appear to be any obvious signs of infection here. Most of the wounds are actually for the most part eschar and dry. With that being said the patient is not actually a candidate for revascularization according to Dr. Gwenlyn Found who he has seen previous. Nonetheless I think that for the time being Betadine or something of that sort just keep things clean and dry will be a good way to go. 3/24; patient has 3 areas on the dorsal right toes first fourth and fifth that are necrotic probably ischemic. He was not felt to be a candidate for revascularization therapy. He also has an area on the left fourth toe in a similar situation. His significant other is concerned about possibility of an abscess on his buttock. He was put on antibiotics last time. I must say that when he was here before he seemed to have recurrent small abscesses but the purulence in this culture is negative I wondered about an epidermoid cyst although this area currently is not in the same spot 4/21; patient presents for his 1 month follow-up. He has chronic necrotic right toes. The affected toes include the first fourth and fifth digits. The third  toe wound has closed. He uses Betadine on this daily. He has no complaints today. He denies fever chills, erythema to the wound sites. 5/19; patient presents for 1 month follow-up. He has had no issues in the past month with his bilateral toe wounds due to arterial insufficiency. He continues to use Betadine on these daily. He has no complaints today. He denies signs of infection. 6/30; patient presents for 6-week follow-up. He denies any issues. He has been using Betadine to the areas of necrosis on the right toes. He denies signs of infection. 8/25; patient presents for 58-monthfollow-up. He states that 1 week ago he developed some thick drainage from the fourth toe near the toenail. Patient has been putting antibiotic ointment to this area. He denies increased warmth or erythema to the fourth toe. He denies systemic signs of infection. He continues to use Betadine to the other necrotic areas. 9/1; patient states he has taken Bactrim for the past week. He noticed yellow drainage until 2 days ago when it stopped. He denies increased warmth or erythema to the area. He denies systemic signs of infection. Patient History Information obtained from Patient. Family History Unknown History. Social History Former smoker - quit 12 years ago, Marital Status - Divorced, Alcohol Use - Never, Drug Use - No History, Caffeine Use - Rarely. Medical History Cardiovascular Patient has history of Congestive Heart Failure, Coronary Artery Disease, Hypertension Endocrine Patient has history of Type II Diabetes Denies history of Type I Diabetes Neurologic Patient has history of Seizure Disorder Oncologic Denies history of Received Chemotherapy, Received Radiation Medical A Surgical History Notes nd Cardiovascular CVA, Carotid stenosis Genitourinary CKD stage 3 Neurologic Parkinson's, left hemiparesis, CVA Oncologic prostate cancer with mets to bone, bladder  cancer Objective Constitutional respirations regular, non-labored and within target range for patient.. Vitals Time Taken: 2:12 PM, Height: 64 in, Weight: 150 lbs, BMI: 25.7, Temperature: 97.4 F, Pulse: 60 bpm, Respiratory Rate: 17 breaths/min, Blood Pressure: 112/69 mmHg, Capillary Blood Glucose: 133 mg/dl. Psychiatric pleasant and cooperative. General Notes: Right foot: He has eschar to the dorsal first and fourth toes. I am unable to express drainage. No increased warmth or erythema to the fourth toe. No more eschar to the fifth toe. Foot and leg are  warm. Integumentary (Hair, Skin) Wound #4 status is Open. Original cause of wound was Not Known. The date acquired was: 10/11/2020. The wound has been in treatment 34 weeks. The wound is located on the Right T Great. The wound measures 1cm length x 0.8cm width x 0.2cm depth; 0.628cm^2 area and 0.126cm^3 volume. There is no tunneling oe or undermining noted. There is a medium amount of serosanguineous drainage noted. The wound margin is flat and intact. There is no granulation within the wound bed. There is a large (67-100%) amount of necrotic tissue within the wound bed including Eschar. Wound #6 status is Open. Original cause of wound was Gradually Appeared. The date acquired was: 11/22/2020. The wound has been in treatment 29 weeks. The wound is located on the Right T Fourth. The wound measures 1cm length x 1.7cm width x 0.1cm depth; 1.335cm^2 area and 0.134cm^3 volume. There is oe Fat Layer (Subcutaneous Tissue) exposed. There is no tunneling or undermining noted. There is a small amount of purulent drainage noted. The wound margin is distinct with the outline attached to the wound base. There is no granulation within the wound bed. There is a large (67-100%) amount of necrotic tissue within the wound bed including Eschar. 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 Patient would like a few more days of antibiotics to assure the drainage has stopped. I think this is reasonable. He will of completed 10 days of Bactrim for this issue. Follow-up in 1 week. Plan Follow-up Appointments: Return Appointment in 1 week. - Dr. Heber [place] Bathing/ Shower/ Hygiene: May shower with protection but do not get wound dressing(s) wet. Edema Control - Lymphedema / SCD / Other: Moisturize legs daily. Off-Loading: Low air-loss mattress (Group 2) Turn and reposition every 2 hours Other: - CONTINUE TO USE BUNNY BOOTS FOR HEEL PROTECTION. UP IN WHEELCHAIR NO MORE THAT 2 HOUR INCREMENTS. Ensure to relieve pressure off buttock closed areas to prevent reopening. Home Health: No change in wound care orders this week; continue Home Health for wound care. May utilize formulary equivalent dressing for wound treatment orders unless otherwise specified. Other Home Health Orders/Instructions: - [facility] The following medication(s) was prescribed: Bactrim DS oral 800 mg-160 mg tablet 1 1 tablet every 12 hours for 3 days starting 06/13/2021 WOUND #4: - T Great Wound Laterality: Right oe Cleanser: Wound Cleanser (Home Health) 1 x Per Day/30 Days Discharge Instructions: Cleanse the wound with wound cleanser prior to applying a clean dressing using gauze sponges, not tissue or cotton balls. Prim Dressing: Betadine 1 x Per Day/30 Days ary Discharge Instructions: apply betadine to wound. Secondary Dressing: Woven Gauze Sponges 2x2 in (Home Health) 1 x Per Day/30 Days Discharge Instructions: Apply over primary dressing as directed. Secured With: Child psychotherapist, Sterile 2x75 (in/in)  (Home Health) 1 x Per Day/30 Days Discharge Instructions: Secure with stretch gauze as directed. WOUND #6: - T Fourth Wound Laterality: Right oe Cleanser: Wound Cleanser (Home Health) 1 x Per Day/30 Days Discharge Instructions: Cleanse the wound with wound cleanser prior to applying a clean dressing using gauze sponges, not tissue or cotton balls. Prim Dressing: Betadine 1 x Per Day/30 Days ary Discharge Instructions: apply betadine to  wound. Secondary Dressing: Woven Gauze Sponges 2x2 in (Home Health) 1 x Per Day/30 Days Discharge Instructions: Apply over primary dressing as directed. Secured With: Child psychotherapist, Sterile 2x75 (in/in) (Home Health) 1 x Per Day/30 Days Discharge Instructions: Secure with stretch gauze as directed. 1. Bactrim for 3 more days 2. Betadine to necrotic areas 3. Follow-up in 1 week Electronic Signature(s) Signed: 06/13/2021 3:20:42 PM By: Kalman Shan DO Entered By: Kalman Shan on 06/13/2021 15:20:05 -------------------------------------------------------------------------------- HxROS Details Patient Name: Date of Service: [name] 06/13/2021 2:00 PM Medical Record Number: [id number] Patient Account Number: [id number] Date of Birth/Sex: Treating RN: 1946/01/11 (75 y.o. [name], [name] Primary Care Provider: [name] Other Clinician: Referring Provider: Treating Provider/Extender: Theodosia Quay in Treatment: 52 Information Obtained From Patient Cardiovascular Medical History: Positive for: Congestive Heart Failure; Coronary Artery Disease; Hypertension Past Medical History Notes: CVA, Carotid stenosis Endocrine Medical History: Positive for: Type II Diabetes Negative for: Type I Diabetes Time with diabetes: 5 years ago Treated with: Insulin Blood sugar tested every day: Yes Tested : once a day Genitourinary Medical History: Past Medical History Notes: CKD stage  3 Neurologic Medical History: Positive for: Seizure Disorder Past Medical History Notes: Parkinson's, left hemiparesis, CVA Oncologic Medical History: Negative for: Received Chemotherapy; Received Radiation Past Medical History Notes: prostate cancer with mets to bone, bladder cancer Immunizations Pneumococcal Vaccine: Received Pneumococcal Vaccination: Yes Received Pneumococcal Vaccination On or After 60th Birthday: No Implantable Devices None Family and Social History Unknown History: Yes; Former smoker - quit 12 years ago; Marital Status - Divorced; Alcohol Use: Never; Drug Use: No History; Caffeine Use: Rarely; Financial Concerns: No; Food, Clothing or Shelter Needs: No; Support System Lacking: No; Transportation Concerns: No Electronic Signature(s) Signed: 06/13/2021 3:20:42 PM By: Kalman Shan DO Signed: 06/13/2021 4:23:50 PM By: Rhae Hammock RN Entered By: Kalman Shan on 06/13/2021 15:15:38 -------------------------------------------------------------------------------- SuperBill Details Patient Name: Date of Service: [name] 06/13/2021 Medical Record Number: [id number] Patient Account Number: [id number] Date of Birth/Sex: Treating RN: 08-30-1946 (75 y.o. [name], [name] Primary Care Provider: [name] Other Clinician: Referring Provider: Treating Provider/Extender: Theodosia Quay in Treatment: 78 Diagnosis Coding ICD-10 Codes Code Description [phone] Non-pressure chronic ulcer of other part of left foot with other specified severity L97.514 Non-pressure chronic ulcer of other part of right foot with necrosis of bone E11.51 Type 2 diabetes mellitus with diabetic peripheral angiopathy without gangrene Facility Procedures CPT4 Code: [id number] Description: 99213 - WOUND CARE VISIT-LEV 3 EST PT Modifier: Quantity: 1 Physician Procedures : CPT4 Code Description Modifier [id number] - WC PHYS LEVEL 3 - EST PT  ICD-10 Diagnosis Description L97.528 Non-pressure chronic ulcer of other part of left foot with other specified severity L97.514 Non-pressure chronic ulcer of other part of right  foot with necrosis of bone E11.51 Type 2 diabetes mellitus with diabetic peripheral angiopathy without gangrene Quantity: 1 Electronic Signature(s) Signed: 06/13/2021 3:20:42 PM By: Kalman Shan DO Entered By: Kalman Shan on 06/13/2021 15:20:17

## 2021-06-15 ENCOUNTER — Encounter: Payer: Self-pay | Admitting: Dermatology

## 2021-06-15 NOTE — Progress Notes (Signed)
   Follow-Up Visit   Subjective  [name] is a 75 y.o. male who presents for the following: Skin Problem (Left forehead x 2 months- no itch no bleed- wants it checked).  General skin check, focus on enlarging nodule left forehead Location:  Duration:  Quality:  Associated Signs/Symptoms: Modifying Factors:  Severity:  Timing: Context:   Objective  Well appearing patient in no apparent distress; mood and affect are within normal limits. Waist up skin examination, no atypical pigmented lesions.  Dozens of clinically typical 4 to 12 mm brown flattopped textured seborrheic keratoses (no intervention necessary).  Left Forehead Exophytic nodule with pearly border, verrucous, eroded center, favor SCCA over [place].        All skin waist up examined.   Assessment & Plan    Neoplasm of uncertain behavior of skin Left Forehead  Skin / nail biopsy Type of biopsy: tangential   Informed consent: discussed and consent obtained   Timeout: patient name, date of birth, surgical site, and procedure verified   Anesthesia: the lesion was anesthetized in a standard fashion   Anesthetic:  1% lidocaine w/ epinephrine 1-100,000 local infiltration Instrument used: flexible razor blade   Hemostasis achieved with: aluminum chloride and electrodesiccation   Outcome: patient tolerated procedure well   Post-procedure details: wound care instructions given    Specimen 1 - Surgical pathology Differential Diagnosis: bcc scc  Check Margins: No  If biopsy confirms will recommend Mohs surgery (has had previous)  Encounter for screening for malignant neoplasm of skin  Annual skin examination.      I, Lavonna Monarch, MD, have reviewed all documentation for this visit.  The documentation on 06/15/21 for the exam, diagnosis, procedures, and orders are all accurate and complete.

## 2021-06-18 NOTE — Progress Notes (Signed)
Luis, PAREDEZ [name] ([id number]) . Visit Report for 06/13/2021 Arrival Information Details Patient Name: Date of Service: [name], [name] [facility] [name]. 06/13/2021 2:00 PM Medical Record Number: [id number] Patient Account Number: [id number] Date of Birth/Sex: Treating RN: 08/07/1946 (75 y.o. [name], Lauren Primary Care Caesar Mannella: [name] Other Clinician: Referring Liridona Mashaw: Treating Dewayne Jurek/Extender: Theodosia Quay in Treatment: 42 Visit Information History Since Last Visit Added or deleted any medications: No Patient Arrived: Wheel Chair Any new allergies or adverse reactions: No Arrival Time: 14:11 Had a fall or experienced change in No Accompanied By: wife activities of daily living that may affect Transfer Assistance: None risk of falls: Patient Identification Verified: Yes Signs or symptoms of abuse/neglect since last visito No Secondary Verification Process Completed: Yes Hospitalized since last visit: No Patient Requires Transmission-Based Precautions: No Implantable device outside of the clinic excluding No Patient Has Alerts: Yes cellular tissue based products placed in the center Patient Alerts: Patient on Blood Thinner since last visit: Has Dressing in Place as Prescribed: Yes Pain Present Now: No Electronic Signature(s) Signed: 06/18/2021 7:50:07 AM By: Sandre Kitty Entered By: Sandre Kitty on 06/13/2021 14:11:48 -------------------------------------------------------------------------------- Clinic Level of Care Assessment Details Patient Name: Date of Service: [name] [facility] [name]. 06/13/2021 2:00 PM Medical Record Number: [id number] Patient Account Number: [id number] Date of Birth/Sex: Treating RN: 1946/09/09 (75 y.o. [name], Campbell Primary Care Alynna Hargrove: [name] Other Clinician: Referring Beya Tipps: Treating Yuki Purves/Extender: Theodosia Quay in Treatment: 60 Clinic Level of Care Assessment  Items TOOL 4 Quantity Score X- 1 0 Use when only an EandM is performed on FOLLOW-UP visit ASSESSMENTS - Nursing Assessment / Reassessment []  - 0 Reassessment of Co-morbidities (includes updates in patient status) []  - 0 Reassessment of Adherence to Treatment Plan ASSESSMENTS - Wound and Skin A ssessment / Reassessment []  - 0 Simple Wound Assessment / Reassessment - one wound X- 2 5 Complex Wound Assessment / Reassessment - multiple wounds []  - 0 Dermatologic / Skin Assessment (not related to wound area) ASSESSMENTS - Focused Assessment []  - 0 Circumferential Edema Measurements - multi extremities []  - 0 Nutritional Assessment / Counseling / Intervention []  - 0 Lower Extremity Assessment (monofilament, tuning fork, pulses) []  - 0 Peripheral Arterial Disease Assessment (using hand held doppler) ASSESSMENTS - Ostomy and/or Continence Assessment and Care []  - 0 Incontinence Assessment and Management []  - 0 Ostomy Care Assessment and Management (repouching, etc.) PROCESS - Coordination of Care []  - 0 Simple Patient / Family Education for ongoing care X- 1 20 Complex (extensive) Patient / Family Education for ongoing care X- 1 10 Staff obtains Programmer, systems, Records, [name] Results / Process Orders est []  - 0 Staff telephones HHA, Nursing Homes / Clarify orders / etc []  - 0 Routine Transfer to another Facility (non-emergent condition) []  - 0 Routine Hospital Admission (non-emergent condition) []  - 0 New Admissions / Biomedical engineer / Ordering NPWT Apligraf, etc. , []  - 0 Emergency Hospital Admission (emergent condition) X- 1 10 Simple Discharge Coordination []  - 0 Complex (extensive) Discharge Coordination PROCESS - Special Needs []  - 0 Pediatric / Minor Patient Management []  - 0 Isolation Patient Management []  - 0 Hearing / Language / Visual special needs []  - 0 Assessment of Community assistance (transportation, D/C planning, etc.) []  - 0 Additional  assistance / Altered mentation []  - 0 Support Surface(s) Assessment (bed, cushion, seat, etc.) INTERVENTIONS - Wound Cleansing / Measurement []  - 0 Simple Wound Cleansing - one wound X- 2 5 Complex Wound Cleansing -  multiple wounds X- 1 5 Wound Imaging (photographs - any number of wounds) $RemoveBe'[]'gutfjbVjN$  - 0 Wound Tracing (instead of photographs) $RemoveBeforeD'[]'LKJpOceokrNtvr$  - 0 Simple Wound Measurement - one wound X- 2 5 Complex Wound Measurement - multiple wounds INTERVENTIONS - Wound Dressings $RemoveBeforeD'[]'anrahDkSOrexCs$  - 0 Small Wound Dressing one or multiple wounds X- 2 15 Medium Wound Dressing one or multiple wounds $RemoveBeforeD'[]'FNKrCsLprKkRKH$  - 0 Large Wound Dressing one or multiple wounds X- 1 5 Application of Medications - topical $RemoveB'[]'BNzcubRp$  - 0 Application of Medications - injection INTERVENTIONS - Miscellaneous $RemoveBeforeD'[]'LxHxkgvFsSPsIP$  - 0 External ear exam $Remove'[]'gjUUzoS$  - 0 Specimen Collection (cultures, biopsies, blood, body fluids, etc.) $RemoveBefor'[]'nFbTJJGIISrP$  - 0 Specimen(s) / Culture(s) sent or taken to Lab for analysis $RemoveBefo'[]'JBiwRbSfazw$  - 0 Patient Transfer (multiple staff / Civil Service fast streamer / Similar devices) $RemoveBeforeDE'[]'JUSwyUjYFKmqwkr$  - 0 Simple Staple / Suture removal (25 or less) $Remove'[]'VXapVws$  - 0 Complex Staple / Suture removal (26 or more) $Remove'[]'vgSGmtl$  - 0 Hypo / Hyperglycemic Management (close monitor of Blood Glucose) $RemoveBefore'[]'wSgcsngFJypuU$  - 0 Ankle / Brachial Index (ABI) - do not check if billed separately X- 1 5 Vital Signs Has the patient been seen at the hospital within the last three years: Yes Total Score: 115 Level Of Care: New/Established - Level 3 Electronic Signature(s) Signed: 06/13/2021 4:23:50 PM By: Rhae Hammock RN Entered By: Rhae Hammock on 06/13/2021 14:31:22 -------------------------------------------------------------------------------- Encounter Discharge Information Details Patient Name: Date of Service: [name] [name]. 06/13/2021 2:00 PM Medical Record Number: [id number] Patient Account Number: [id number] Date of Birth/Sex: Treating RN: 15-Jan-1946 (75 y.o. [name], Lauren Primary Care Kayden Amend: [name] Other  Clinician: Referring Leydi Winstead: Treating Jadelin Eng/Extender: Theodosia Quay in Treatment: 76 Encounter Discharge Information Items Discharge Condition: Stable Ambulatory Status: Wheelchair Discharge Destination: Home Transportation: Private Auto Accompanied By: wife Schedule Follow-up Appointment: Yes Clinical Summary of Care: Patient Declined Electronic Signature(s) Signed: 06/18/2021 7:50:07 AM By: Sandre Kitty Entered By: Sandre Kitty on 06/13/2021 14:50:04 -------------------------------------------------------------------------------- Lower Extremity Assessment Details Patient Name: Date of Service: EYDEN, DOBIE [name] [name]. 06/13/2021 2:00 PM Medical Record Number: [id number] Patient Account Number: [id number] Date of Birth/Sex: Treating RN: [birth year] (75 y.o. [name], Lauren Primary Care Yitzchak Kothari: [name] Other Clinician: Referring Foxx Klarich: Treating Raynard Mapps/Extender: Edgardo Roys Weeks in Treatment: 45 Edema Assessment Assessed: Shirlyn Goltz: Yes] Patrice Paradise: No] Edema: [Left: No] [Right: No] Calf Left: Right: Point of Measurement: 27 cm From Medial Instep 23 cm 25 cm Ankle Left: Right: Point of Measurement: 9 cm From Medial Instep 18 cm 17 cm Vascular Assessment Pulses: Dorsalis Pedis Palpable: [Left:Yes] [Right:Yes] Electronic Signature(s) Signed: 06/13/2021 4:23:50 PM By: Rhae Hammock RN Signed: 06/18/2021 7:50:07 AM By: Sandre Kitty Entered By: Sandre Kitty on 06/13/2021 14:13:33 -------------------------------------------------------------------------------- Multi Wound Chart Details Patient Name: Date of Service: [name] [name]. 06/13/2021 2:00 PM Medical Record Number: [id number] Patient Account Number: [id number] Date of Birth/Sex: Treating RN: 24-Sep-1946 (75 y.o. [name], Lauren Primary Care Evea Sheek: [name] Other Clinician: Referring Lenola Lockner: Treating Etty Isaac/Extender: Theodosia Quay in Treatment: 60 Vital Signs Height(in): 64 Capillary Blood Glucose(mg/dl): 133 Weight(lbs): 150 Pulse(bpm): 60 Body Mass Index(BMI): 26 Blood Pressure(mmHg): 112/69 Temperature(F): 97.4 Respiratory Rate(breaths/min): 17 Photos: [4:No Photos Right [name] Great oe] [6:No Photos Right [name] Fourth oe] [N/A:N/A N/A] Wound Location: [4:Not Known] [6:Gradually Appeared] [N/A:N/A] Wounding Event: [4:Diabetic Wound/Ulcer of the Lower] [6:Diabetic Wound/Ulcer of the Lower] [N/A:N/A] Primary Etiology: [4:Extremity Congestive Heart Failure, Coronary] [6:Extremity Congestive Heart Failure, Coronary N/A] Comorbid History: [4:Artery Disease, Hypertension, Type II Diabetes, Seizure Disorder 10/11/2020] [6:Artery  Disease, Hypertension, Type II Diabetes, Seizure Disorder 11/22/2020] [N/A:N/A] Date Acquired: [4:34] [6:29] [N/A:N/A] Weeks of Treatment: [4:Open] [6:Open] [N/A:N/A] Wound Status: [4:1x0.8x0.2] [6:1x1.7x0.1] [N/A:N/A] Measurements L x W x D (cm) [4:0.628] [6:1.335] [N/A:N/A] A (cm) : rea [4:0.126] [6:0.134] [N/A:N/A] Volume (cm) : [4:42.90%] [6:-183.40%] [N/A:N/A] % Reduction in A rea: [4:-14.50%] [6:-185.10%] [N/A:N/A] % Reduction in Volume: [4:Grade 2] [6:Grade 2] [N/A:N/A] Classification: [4:Medium] [6:Small] [N/A:N/A] Exudate A mount: [4:Serosanguineous] [6:Purulent] [N/A:N/A] Exudate Type: [4:red, brown] [6:yellow, brown, green] [N/A:N/A] Exudate Color: [4:Flat and Intact] [6:Distinct, outline attached] [N/A:N/A] Wound Margin: [4:None Present (0%)] [6:None Present (0%)] [N/A:N/A] Granulation A mount: [4:Large (67-100%)] [6:Large (67-100%)] [N/A:N/A] Necrotic A mount: [4:Eschar] [6:Eschar] [N/A:N/A] Necrotic Tissue: [4:Fascia: No] [6:Fat Layer (Subcutaneous Tissue): Yes N/A] Exposed Structures: [4:Fat Layer (Subcutaneous Tissue): No Tendon: No Muscle: No Joint: No Bone: No None] [6:Fascia: No Tendon: No Muscle: No Joint: No Bone: No None]  [N/A:N/A] Treatment Notes Wound #4 (Toe Great) Wound Laterality: Right Cleanser Wound Cleanser Discharge Instruction: Cleanse the wound with wound cleanser prior to applying a clean dressing using gauze sponges, not tissue or cotton balls. Peri-Wound Care Topical Primary Dressing Betadine Discharge Instruction: apply betadine to wound. Secondary Dressing Woven Gauze Sponges 2x2 in Discharge Instruction: Apply over primary dressing as directed. Secured With Conforming Stretch Gauze Bandage, Sterile 2x75 (in/in) Discharge Instruction: Secure with stretch gauze as directed. Compression Wrap Compression Stockings Add-Ons Wound #6 (Toe Fourth) Wound Laterality: Right Cleanser Wound Cleanser Discharge Instruction: Cleanse the wound with wound cleanser prior to applying a clean dressing using gauze sponges, not tissue or cotton balls. Peri-Wound Care Topical Primary Dressing Betadine Discharge Instruction: apply betadine to wound. Secondary Dressing Woven Gauze Sponges 2x2 in Discharge Instruction: Apply over primary dressing as directed. Secured With Conforming Stretch Gauze Bandage, Sterile 2x75 (in/in) Discharge Instruction: Secure with stretch gauze as directed. Compression Wrap Compression Stockings Add-Ons Electronic Signature(s) Signed: 06/13/2021 3:20:42 PM By: Kalman Shan DO Signed: 06/13/2021 4:23:50 PM By: Rhae Hammock RN Entered By: Kalman Shan on 06/13/2021 15:14:33 -------------------------------------------------------------------------------- Multi-Disciplinary Care Plan Details Patient Name: Date of Service: [name] [name]. 06/13/2021 2:00 PM Medical Record Number: [id number] Patient Account Number: [id number] Date of Birth/Sex: Treating RN: [birth year] (75 y.o. [name], Lauren Primary Care Mamoudou Mulvehill: [name] Other Clinician: Referring Ayad Nieman: Treating Guinevere Stephenson/Extender: Theodosia Quay in Treatment:  Brodnax reviewed with physician Active Inactive Wound/Skin Impairment Nursing Diagnoses: Impaired tissue integrity Knowledge deficit related to ulceration/compromised skin integrity Goals: Patient/caregiver will verbalize understanding of skin care regimen Date Initiated: 07/27/2020 Target Resolution Date: 07/05/2021 Goal Status: Active Ulcer/skin breakdown will have a volume reduction of 30% by week 4 Date Initiated: 07/27/2020 Date Inactivated: 09/13/2020 Target Resolution Date: 08/24/2020 Goal Status: Met Interventions: Assess patient/caregiver ability to obtain necessary supplies Assess patient/caregiver ability to perform ulcer/skin care regimen upon admission and as needed Assess ulceration(s) every visit Provide education on ulcer and skin care Treatment Activities: Skin care regimen initiated : 07/27/2020 Topical wound management initiated : 07/27/2020 Notes: Electronic Signature(s) Signed: 06/13/2021 4:23:50 PM By: Rhae Hammock RN Entered By: Rhae Hammock on 06/13/2021 14:28:17 -------------------------------------------------------------------------------- Pain Assessment Details Patient Name: Date of Service: [name] [name]. 06/13/2021 2:00 PM Medical Record Number: [id number] Patient Account Number: [id number] Date of Birth/Sex: Treating RN: 1946/08/22 (75 y.o. Erie Noe Primary Care Emelee Rodocker: [name] Other Clinician: Referring Rahil Passey: Treating Diem Pagnotta/Extender: Theodosia Quay in Treatment: 45 Active Problems Location of Pain Severity and Description of Pain Patient Has Paino No Site Locations Pain  Management and Medication Current Pain Management: Electronic Signature(s) Signed: 06/13/2021 4:23:50 PM By: Rhae Hammock RN Signed: 06/18/2021 7:50:07 AM By: Sandre Kitty Entered By: Sandre Kitty on 06/13/2021  14:13:18 -------------------------------------------------------------------------------- Patient/Caregiver Education Details Patient Name: Date of Service: Julieta Gutting 9/1/2022andnbsp2:00 PM Medical Record Number: [id number] Patient Account Number: [id number] Date of Birth/Gender: Treating RN: 1946/04/20 (75 y.o. Erie Noe Primary Care Physician: [name] Other Clinician: Referring Physician: Treating Physician/Extender: Theodosia Quay in Treatment: 50 Education Assessment Education Provided To: Patient Education Topics Provided Wound/Skin Impairment: Methods: Explain/Verbal Responses: State content correctly [employer]) Signed: 06/13/2021 4:23:50 PM By: Rhae Hammock RN Entered By: Rhae Hammock on 06/13/2021 14:28:32 -------------------------------------------------------------------------------- Wound Assessment Details Patient Name: Date of Service: [name] [name]. 06/13/2021 2:00 PM Medical Record Number: [id number] Patient Account Number: [id number] Date of Birth/Sex: Treating RN: 1946-06-20 (75 y.o. [name], Lauren Primary Care Harvie Morua: [name] Other Clinician: Referring Niel Peretti: Treating Jozlynn Plaia/Extender: Theodosia Quay in Treatment: 59 Wound Status Wound Number: 4 Primary Diabetic Wound/Ulcer of the Lower Extremity Etiology: Wound Location: Right [name] Great oe Wound Open Wounding Event: Not Known Status: Date Acquired: 10/11/2020 Comorbid Congestive Heart Failure, Coronary Artery Disease, Weeks Of Treatment: 34 History: Hypertension, Type II Diabetes, Seizure Disorder Clustered Wound: No Wound Measurements Length: (cm) 1 Width: (cm) 0.8 Depth: (cm) 0.2 Area: (cm) 0.628 Volume: (cm) 0.126 % Reduction in Area: 42.9% % Reduction in Volume: -14.5% Epithelialization: None Tunneling: No Undermining: No Wound Description Classification: Grade  2 Wound Margin: Flat and Intact Exudate Amount: Medium Exudate Type: Serosanguineous Exudate Color: red, brown Foul Odor After Cleansing: No Slough/Fibrino Yes Wound Bed Granulation Amount: None Present (0%) Exposed Structure Necrotic Amount: Large (67-100%) Fascia Exposed: No Necrotic Quality: Eschar Fat Layer (Subcutaneous Tissue) Exposed: No Tendon Exposed: No Muscle Exposed: No Joint Exposed: No Bone Exposed: No Treatment Notes Wound #4 (Toe Great) Wound Laterality: Right Cleanser Wound Cleanser Discharge Instruction: Cleanse the wound with wound cleanser prior to applying a clean dressing using gauze sponges, not tissue or cotton balls. Peri-Wound Care Topical Primary Dressing Betadine Discharge Instruction: apply betadine to wound. Secondary Dressing Woven Gauze Sponges 2x2 in Discharge Instruction: Apply over primary dressing as directed. Secured With Conforming Stretch Gauze Bandage, Sterile 2x75 (in/in) Discharge Instruction: Secure with stretch gauze as directed. Compression Wrap Compression Stockings Add-Ons Electronic Signature(s) Signed: 06/13/2021 4:23:50 PM By: Rhae Hammock RN Entered By: Rhae Hammock on 06/13/2021 14:24:29 -------------------------------------------------------------------------------- Wound Assessment Details Patient Name: Date of Service: [name] [name]. 06/13/2021 2:00 PM Medical Record Number: [id number] Patient Account Number: [id number] Date of Birth/Sex: Treating RN: 17-Jan-1946 (75 y.o. [name], Lauren Primary Care Jarid Sasso: [name] Other Clinician: Referring Kaiyana Bedore: Treating Carel Schnee/Extender: Theodosia Quay in Treatment: 33 Wound Status Wound Number: 6 Primary Diabetic Wound/Ulcer of the Lower Extremity Etiology: Wound Location: Right [name] Fourth oe Wound Open Wounding Event: Gradually Appeared Status: Date Acquired: 11/22/2020 Comorbid Congestive Heart Failure, Coronary  Artery Disease, Weeks Of Treatment: 29 History: Hypertension, Type II Diabetes, Seizure Disorder Clustered Wound: No Wound Measurements Length: (cm) 1 Width: (cm) 1.7 Depth: (cm) 0.1 Area: (cm) 1.335 Volume: (cm) 0.134 % Reduction in Area: -183.4% % Reduction in Volume: -185.1% Epithelialization: None Tunneling: No Undermining: No Wound Description Classification: Grade 2 Wound Margin: Distinct, outline attached Exudate Amount: Small Exudate Type: Purulent Exudate Color: yellow, brown, green Foul Odor After Cleansing: No Slough/Fibrino No Wound Bed Granulation Amount: None Present (0%) Exposed Structure Necrotic Amount: Large (67-100%) Fascia  Exposed: No Necrotic Quality: Eschar Fat Layer (Subcutaneous Tissue) Exposed: Yes Tendon Exposed: No Muscle Exposed: No Joint Exposed: No Bone Exposed: No Treatment Notes Wound #6 (Toe Fourth) Wound Laterality: Right Cleanser Wound Cleanser Discharge Instruction: Cleanse the wound with wound cleanser prior to applying a clean dressing using gauze sponges, not tissue or cotton balls. Peri-Wound Care Topical Primary Dressing Betadine Discharge Instruction: apply betadine to wound. Secondary Dressing Woven Gauze Sponges 2x2 in Discharge Instruction: Apply over primary dressing as directed. Secured With Conforming Stretch Gauze Bandage, Sterile 2x75 (in/in) Discharge Instruction: Secure with stretch gauze as directed. Compression Wrap Compression Stockings Add-Ons Electronic Signature(s) Signed: 06/13/2021 4:23:50 PM By: Rhae Hammock RN Entered By: Rhae Hammock on 06/13/2021 14:25:15 -------------------------------------------------------------------------------- Vitals Details Patient Name: Date of Service: [name] [name]. 06/13/2021 2:00 PM Medical Record Number: [id number] Patient Account Number: [id number] Date of Birth/Sex: Treating RN: 1945-11-17 (75 y.o. [name], Lauren Primary Care Hanya Guerin:  [name] Other Clinician: Referring Naveh Rickles: Treating Pualani Borah/Extender: Theodosia Quay in Treatment: 45 Vital Signs Time Taken: 14:12 Temperature (F): 97.4 Height (in): 64 Pulse (bpm): 60 Weight (lbs): 150 Respiratory Rate (breaths/min): 17 Body Mass Index (BMI): 25.7 Blood Pressure (mmHg): 112/69 Capillary Blood Glucose (mg/dl): 133 Reference Range: 80 - 120 mg / dl Electronic Signature(s) Signed: 06/18/2021 7:50:07 AM By: Sandre Kitty Entered By: Sandre Kitty on 06/13/2021 14:13:13

## 2021-06-19 DIAGNOSIS — S90411D Abrasion, right great toe, subsequent encounter: Secondary | ICD-10-CM | POA: Diagnosis not present

## 2021-06-19 DIAGNOSIS — Z466 Encounter for fitting and adjustment of urinary device: Secondary | ICD-10-CM | POA: Diagnosis not present

## 2021-06-19 DIAGNOSIS — L89892 Pressure ulcer of other site, stage 2: Secondary | ICD-10-CM | POA: Diagnosis not present

## 2021-06-19 DIAGNOSIS — I130 Hypertensive heart and chronic kidney disease with heart failure and stage 1 through stage 4 chronic kidney disease, or unspecified chronic kidney disease: Secondary | ICD-10-CM | POA: Diagnosis not present

## 2021-06-19 DIAGNOSIS — E1122 Type 2 diabetes mellitus with diabetic chronic kidney disease: Secondary | ICD-10-CM | POA: Diagnosis not present

## 2021-06-19 DIAGNOSIS — G20 Parkinson's disease: Secondary | ICD-10-CM | POA: Diagnosis not present

## 2021-06-19 DIAGNOSIS — N1832 Chronic kidney disease, stage 3b: Secondary | ICD-10-CM | POA: Diagnosis not present

## 2021-06-19 DIAGNOSIS — I69354 Hemiplegia and hemiparesis following cerebral infarction affecting left non-dominant side: Secondary | ICD-10-CM | POA: Diagnosis not present

## 2021-06-19 DIAGNOSIS — I5032 Chronic diastolic (congestive) heart failure: Secondary | ICD-10-CM | POA: Diagnosis not present

## 2021-06-19 DIAGNOSIS — Z794 Long term (current) use of insulin: Secondary | ICD-10-CM | POA: Diagnosis not present

## 2021-06-20 ENCOUNTER — Encounter (HOSPITAL_BASED_OUTPATIENT_CLINIC_OR_DEPARTMENT_OTHER): Payer: Medicare Other | Admitting: Internal Medicine

## 2021-06-20 ENCOUNTER — Other Ambulatory Visit: Payer: Self-pay

## 2021-06-20 DIAGNOSIS — I130 Hypertensive heart and chronic kidney disease with heart failure and stage 1 through stage 4 chronic kidney disease, or unspecified chronic kidney disease: Secondary | ICD-10-CM | POA: Diagnosis not present

## 2021-06-20 DIAGNOSIS — C7911 Secondary malignant neoplasm of bladder: Secondary | ICD-10-CM | POA: Diagnosis not present

## 2021-06-20 DIAGNOSIS — L97514 Non-pressure chronic ulcer of other part of right foot with necrosis of bone: Secondary | ICD-10-CM | POA: Diagnosis not present

## 2021-06-20 DIAGNOSIS — E1122 Type 2 diabetes mellitus with diabetic chronic kidney disease: Secondary | ICD-10-CM | POA: Diagnosis not present

## 2021-06-20 DIAGNOSIS — L0231 Cutaneous abscess of buttock: Secondary | ICD-10-CM | POA: Diagnosis not present

## 2021-06-20 DIAGNOSIS — E1151 Type 2 diabetes mellitus with diabetic peripheral angiopathy without gangrene: Secondary | ICD-10-CM

## 2021-06-20 DIAGNOSIS — E11621 Type 2 diabetes mellitus with foot ulcer: Secondary | ICD-10-CM | POA: Diagnosis not present

## 2021-06-20 DIAGNOSIS — G20 Parkinson's disease: Secondary | ICD-10-CM | POA: Diagnosis not present

## 2021-06-20 DIAGNOSIS — C7951 Secondary malignant neoplasm of bone: Secondary | ICD-10-CM | POA: Diagnosis not present

## 2021-06-20 DIAGNOSIS — Z87891 Personal history of nicotine dependence: Secondary | ICD-10-CM | POA: Diagnosis not present

## 2021-06-20 DIAGNOSIS — I5032 Chronic diastolic (congestive) heart failure: Secondary | ICD-10-CM | POA: Diagnosis not present

## 2021-06-20 DIAGNOSIS — L97528 Non-pressure chronic ulcer of other part of left foot with other specified severity: Secondary | ICD-10-CM | POA: Diagnosis not present

## 2021-06-20 DIAGNOSIS — N183 Chronic kidney disease, stage 3 unspecified: Secondary | ICD-10-CM | POA: Diagnosis not present

## 2021-06-20 NOTE — Progress Notes (Signed)
MALLIE, LINNEMANN T ([id number]) . Visit Report for 06/20/2021 Arrival Information Details Patient Name: Date of Service: ISA, KOHLENBERG [facility] T. 06/20/2021 2:30 PM Medical Record Number: [id number] Patient Account Number: [id number] Date of Birth/Sex: Treating RN: Jul 20, 1946 (75 y.o. [name], Lauren Primary Care Gionna Polak: [name] Other Clinician: Referring Deanthony Maull: Treating Odus Clasby/Extender: Theodosia Quay in Treatment: 33 Visit Information History Since Last Visit Added or deleted any medications: No Patient Arrived: Wheel Chair Any new allergies or adverse reactions: No Arrival Time: 15:06 Had a fall or experienced change in No Accompanied By: wife activities of daily living that may affect Transfer Assistance: None risk of falls: Patient Identification Verified: Yes Signs or symptoms of abuse/neglect since last visito No Secondary Verification Process Completed: Yes Hospitalized since last visit: No Patient Requires Transmission-Based Precautions: No Implantable device outside of the clinic excluding No Patient Has Alerts: Yes cellular tissue based products placed in the center Patient Alerts: Patient on Blood Thinner since last visit: Has Dressing in Place as Prescribed: Yes Pain Present Now: No Electronic Signature(s) Signed: 06/20/2021 6:08:32 PM By: Rhae Hammock RN Entered By: Rhae Hammock on 06/20/2021 15:07:06 -------------------------------------------------------------------------------- Clinic Level of Care Assessment Details Patient Name: Date of Service: GAYLAN, FAUVER [facility] T. 06/20/2021 2:30 PM Medical Record Number: [id number] Patient Account Number: [id number] Date of Birth/Sex: Treating RN: 1946/04/18 (75 y.o. [name], Haena Primary Care Malik Paar: [name] Other Clinician: Referring Hena Ewalt: Treating Riese Hellard/Extender: Theodosia Quay in Treatment: 46 Clinic Level of Care Assessment  Items TOOL 4 Quantity Score X- 1 0 Use when only an EandM is performed on FOLLOW-UP visit ASSESSMENTS - Nursing Assessment / Reassessment X- 1 10 Reassessment of Co-morbidities (includes updates in patient status) X- 1 5 Reassessment of Adherence to Treatment Plan ASSESSMENTS - Wound and Skin A ssessment / Reassessment X - Simple Wound Assessment / Reassessment - one wound 1 5 []  - 0 Complex Wound Assessment / Reassessment - multiple wounds []  - 0 Dermatologic / Skin Assessment (not related to wound area) ASSESSMENTS - Focused Assessment []  - 0 Circumferential Edema Measurements - multi extremities []  - 0 Nutritional Assessment / Counseling / Intervention []  - 0 Lower Extremity Assessment (monofilament, tuning fork, pulses) []  - 0 Peripheral Arterial Disease Assessment (using hand held doppler) ASSESSMENTS - Ostomy and/or Continence Assessment and Care []  - 0 Incontinence Assessment and Management []  - 0 Ostomy Care Assessment and Management (repouching, etc.) PROCESS - Coordination of Care X - Simple Patient / Family Education for ongoing care 1 15 []  - 0 Complex (extensive) Patient / Family Education for ongoing care X- 1 10 Staff obtains Programmer, systems, Records, T Results / Process Orders est X- 1 10 Staff telephones HHA, Nursing Homes / Clarify orders / etc []  - 0 Routine Transfer to another Facility (non-emergent condition) []  - 0 Routine Hospital Admission (non-emergent condition) []  - 0 New Admissions / Biomedical engineer / Ordering NPWT Apligraf, etc. , []  - 0 Emergency Hospital Admission (emergent condition) X- 1 10 Simple Discharge Coordination []  - 0 Complex (extensive) Discharge Coordination PROCESS - Special Needs []  - 0 Pediatric / Minor Patient Management []  - 0 Isolation Patient Management []  - 0 Hearing / Language / Visual special needs []  - 0 Assessment of Community assistance (transportation, D/C planning, etc.) []  - 0 Additional  assistance / Altered mentation []  - 0 Support Surface(s) Assessment (bed, cushion, seat, etc.) INTERVENTIONS - Wound Cleansing / Measurement []  - 0 Simple Wound Cleansing - one wound X- 2 5  Complex Wound Cleansing - multiple wounds X- 1 5 Wound Imaging (photographs - any number of wounds) $RemoveBe'[]'KcqXdFNyE$  - 0 Wound Tracing (instead of photographs) $RemoveBeforeD'[]'JWVQbSxlUKoGcQ$  - 0 Simple Wound Measurement - one wound X- 2 5 Complex Wound Measurement - multiple wounds INTERVENTIONS - Wound Dressings X - Small Wound Dressing one or multiple wounds 1 10 $Re'[]'qgN$  - 0 Medium Wound Dressing one or multiple wounds $RemoveBeforeD'[]'BrlcmEaFghQlKI$  - 0 Large Wound Dressing one or multiple wounds $RemoveBeforeD'[]'xLJAMWwHWnyyJt$  - 0 Application of Medications - topical $RemoveB'[]'UIWCWHOA$  - 0 Application of Medications - injection INTERVENTIONS - Miscellaneous $RemoveBeforeD'[]'ASxnsctSiVYCcU$  - 0 External ear exam $Remove'[]'DiMBJAz$  - 0 Specimen Collection (cultures, biopsies, blood, body fluids, etc.) $RemoveBefor'[]'gTZwbqaEKisY$  - 0 Specimen(s) / Culture(s) sent or taken to Lab for analysis $RemoveBefo'[]'xrlngPgAaQm$  - 0 Patient Transfer (multiple staff / Civil Service fast streamer / Similar devices) $RemoveBeforeDE'[]'YAxAPogHKGFvjlK$  - 0 Simple Staple / Suture removal (25 or less) $Remove'[]'RiRTSRE$  - 0 Complex Staple / Suture removal (26 or more) $Remove'[]'bukHglk$  - 0 Hypo / Hyperglycemic Management (close monitor of Blood Glucose) $RemoveBefore'[]'zKxLAowQeXNEr$  - 0 Ankle / Brachial Index (ABI) - do not check if billed separately X- 1 5 Vital Signs Has the patient been seen at the hospital within the last three years: Yes Total Score: 105 Level Of Care: New/Established - Level 3 Electronic Signature(s) Signed: 06/20/2021 6:08:32 PM By: Rhae Hammock RN Entered By: Rhae Hammock on 06/20/2021 15:19:17 -------------------------------------------------------------------------------- Encounter Discharge Information Details Patient Name: Date of Service: [name] 06/20/2021 2:30 PM Medical Record Number: [id number] Patient Account Number: [id number] Date of Birth/Sex: Treating RN: [birth year] (75 y.o. [name], Lauren Primary Care Froylan Hobby: [name] Other  Clinician: Referring Elma Limas: Treating Sherylann Vangorden/Extender: Theodosia Quay in Treatment: 54 Encounter Discharge Information Items Discharge Condition: Stable Ambulatory Status: Wheelchair Discharge Destination: Home Transportation: Private Auto Accompanied By: wife Schedule Follow-up Appointment: Yes Clinical Summary of Care: Patient Declined Electronic Signature(s) Signed: 06/20/2021 6:08:32 PM By: Rhae Hammock RN Entered By: Rhae Hammock on 06/20/2021 15:27:15 -------------------------------------------------------------------------------- Lower Extremity Assessment Details Patient Name: Date of Service: [name] 06/20/2021 2:30 PM Medical Record Number: [id number] Patient Account Number: [id number] Date of Birth/Sex: Treating RN: 09-22-1946 (75 y.o. [name], Lauren Primary Care Festus Pursel: [name] Other Clinician: Referring Rigby Swamy: Treating Dea Bitting/Extender: Edgardo Roys Weeks in Treatment: 46 Edema Assessment Assessed: Shirlyn Goltz: Yes] Patrice Paradise: No] Edema: [Left: No] [Right: No] Calf Left: Right: Point of Measurement: 27 cm From Medial Instep 23 cm 25 cm Ankle Left: Right: Point of Measurement: 9 cm From Medial Instep 18 cm 17 cm Vascular Assessment Pulses: Dorsalis Pedis Palpable: [Left:Yes] Posterior Tibial Palpable: [Left:Yes] Electronic Signature(s) Signed: 06/20/2021 6:08:32 PM By: Rhae Hammock RN Entered By: Rhae Hammock on 06/20/2021 15:13:00 -------------------------------------------------------------------------------- Multi Wound Chart Details Patient Name: Date of Service: [name] 06/20/2021 2:30 PM Medical Record Number: [id number] Patient Account Number: [id number] Date of Birth/Sex: Treating RN: 1945-11-11 (75 y.o. [name], Lauren Primary Care Veroncia Jezek: [name] Other Clinician: Referring Grant Henkes: Treating Magda Muise/Extender: Theodosia Quay in Treatment: 72 Vital Signs Height(in): 64 Capillary Blood Glucose(mg/dl): 127 Weight(lbs): 150 Pulse(bpm): 51 Body Mass Index(BMI): 26 Blood Pressure(mmHg): 123/78 Temperature(F): 98.7 Respiratory Rate(breaths/min): 17 Photos: [4:No Photos Right T Great oe] [6:No Photos Right T Fourth oe] [N/A:N/A N/A] Wound Location: [4:Not Known] [6:Gradually Appeared] [N/A:N/A] Wounding Event: [4:Diabetic Wound/Ulcer of the Lower] [6:Diabetic Wound/Ulcer of the Lower] [N/A:N/A] Primary Etiology: [4:Extremity Congestive Heart Failure, Coronary] [6:Extremity Congestive Heart Failure, Coronary N/A] Comorbid History: [4:Artery Disease, Hypertension, Type II Diabetes, Seizure Disorder  10/11/2020] [6:Artery Disease, Hypertension, Type II Diabetes, Seizure Disorder 11/22/2020] [N/A:N/A] Date Acquired: [4:35] [6:30] [N/A:N/A] Weeks of Treatment: [4:Open] [6:Open] [N/A:N/A] Wound Status: [4:1x1x0.1] [6:1x1x0.1] [N/A:N/A] Measurements L x W x D (cm) [4:0.785] [6:0.785] [N/A:N/A] A (cm) : rea [4:0.079] [6:0.079] [N/A:N/A] Volume (cm) : [4:28.60%] [6:-66.70%] [N/A:N/A] % Reduction in A rea: [4:28.20%] [6:-68.10%] [N/A:N/A] % Reduction in Volume: [4:Grade 2] [6:Grade 2] [N/A:N/A] Classification: [4:Medium] [6:Small] [N/A:N/A] Exudate A mount: [4:Serosanguineous] [6:Purulent] [N/A:N/A] Exudate Type: [4:red, brown] [6:yellow, brown, green] [N/A:N/A] Exudate Color: [4:Flat and Intact] [6:Distinct, outline attached] [N/A:N/A] Wound Margin: [4:None Present (0%)] [6:None Present (0%)] [N/A:N/A] Granulation A mount: [4:Large (67-100%)] [6:Large (67-100%)] [N/A:N/A] Necrotic A mount: [4:Eschar] [6:Eschar] [N/A:N/A] Necrotic Tissue: [4:Fascia: No] [6:Fat Layer (Subcutaneous Tissue): Yes N/A] Exposed Structures: [4:Fat Layer (Subcutaneous Tissue): No Tendon: No Muscle: No Joint: No Bone: No None] [6:Fascia: No Tendon: No Muscle: No Joint: No Bone: No None] [N/A:N/A] Treatment  Notes Wound #4 (Toe Great) Wound Laterality: Right Cleanser Wound Cleanser Discharge Instruction: Cleanse the wound with wound cleanser prior to applying a clean dressing using gauze sponges, not tissue or cotton balls. Peri-Wound Care Topical Primary Dressing Betadine Discharge Instruction: apply betadine to wound. Secondary Dressing Woven Gauze Sponges 2x2 in Discharge Instruction: Apply over primary dressing as directed. Secured With Conforming Stretch Gauze Bandage, Sterile 2x75 (in/in) Discharge Instruction: Secure with stretch gauze as directed. Compression Wrap Compression Stockings Add-Ons Wound #6 (Toe Fourth) Wound Laterality: Right Cleanser Wound Cleanser Discharge Instruction: Cleanse the wound with wound cleanser prior to applying a clean dressing using gauze sponges, not tissue or cotton balls. Peri-Wound Care Topical Primary Dressing Betadine Discharge Instruction: apply betadine to wound. Secondary Dressing Woven Gauze Sponges 2x2 in Discharge Instruction: Apply over primary dressing as directed. Secured With Conforming Stretch Gauze Bandage, Sterile 2x75 (in/in) Discharge Instruction: Secure with stretch gauze as directed. Compression Wrap Compression Stockings Add-Ons Electronic Signature(s) Signed: 06/20/2021 3:55:32 PM By: Kalman Shan DO Signed: 06/20/2021 6:08:32 PM By: Rhae Hammock RN Entered By: Kalman Shan on 06/20/2021 15:48:13 -------------------------------------------------------------------------------- Multi-Disciplinary Care Plan Details Patient Name: Date of Service: [name] 06/20/2021 2:30 PM Medical Record Number: [id number] Patient Account Number: [id number] Date of Birth/Sex: Treating RN: Dec 17, 1945 (75 y.o. [name], Lauren Primary Care Antanasia Kaczynski: [name] Other Clinician: Referring Marieliz Strang: Treating Klayten Jolliff/Extender: Theodosia Quay in Treatment: [place]et[place] reviewed with physician Active Inactive Wound/Skin Impairment Nursing Diagnoses: Impaired tissue integrity Knowledge deficit related to ulceration/compromised skin integrity Goals: Patient/caregiver will verbalize understanding of skin care regimen Date Initiated: 07/27/2020 Target Resolution Date: 07/04/2021 Goal Status: Active Ulcer/skin breakdown will have a volume reduction of 30% by week 4 Date Initiated: 07/27/2020 Date Inactivated: 09/13/2020 Target Resolution Date: 08/24/2020 Goal Status: Met Interventions: Assess patient/caregiver ability to obtain necessary supplies Assess patient/caregiver ability to perform ulcer/skin care regimen upon admission and as needed Assess ulceration(s) every visit Provide education on ulcer and skin care Treatment Activities: Skin care regimen initiated : 07/27/2020 Topical wound management initiated : 07/27/2020 Notes: Electronic Signature(s) Signed: 06/20/2021 6:08:32 PM By: Rhae Hammock RN Entered By: Rhae Hammock on 06/20/2021 15:17:10 -------------------------------------------------------------------------------- Pain Assessment Details Patient Name: Date of Service: [name] 06/20/2021 2:30 PM Medical Record Number: [id number] Patient Account Number: [id number] Date of Birth/Sex: Treating RN: Jan 21, 1946 (75 y.o. Erie Noe Primary Care Tobi Groesbeck: [name] Other Clinician: Referring Kasiya Burck: Treating Lane Kjos/Extender: Theodosia Quay in Treatment: 50 Active Problems Location of Pain Severity and Description of Pain Patient Has Paino No Site  Locations Pain Management and Medication Current Pain Management: Electronic Signature(s) Signed: 06/20/2021 6:08:32 PM By: Rhae Hammock RN Entered By: Rhae Hammock on 06/20/2021 15:12:52 -------------------------------------------------------------------------------- Patient/Caregiver Education Details Patient  Name: Date of Service: Julieta Gutting 9/8/2022andnbsp2:30 PM Medical Record Number: [id number] Patient Account Number: [id number] Date of Birth/Gender: Treating RN: August 14, 1946 (75 y.o. Erie Noe Primary Care Physician: [name] Other Clinician: Referring Physician: Treating Physician/Extender: Theodosia Quay in Treatment: 60 Education Assessment Education Provided To: Patient Education Topics Provided Wound/Skin Impairment: Methods: Explain/Verbal Responses: Reinforcements needed, State content correctly Electronic Signature(s) Signed: 06/20/2021 6:08:32 PM By: Rhae Hammock RN Entered By: Rhae Hammock on 06/20/2021 15:17:25 -------------------------------------------------------------------------------- Wound Assessment Details Patient Name: Date of Service: [name] 06/20/2021 2:30 PM Medical Record Number: [id number] Patient Account Number: [id number] Date of Birth/Sex: Treating RN: 12-24-1945 (75 y.o. [name], Lauren Primary Care Lalana Wachter: [name] Other Clinician: Referring Brodi Nery: Treating Macie Baum/Extender: Theodosia Quay in Treatment: 46 Wound Status Wound Number: 4 Primary Diabetic Wound/Ulcer of the Lower Extremity Etiology: Wound Location: Right T Great oe Wound Open Wounding Event: Not Known Status: Date Acquired: 10/11/2020 Comorbid Congestive Heart Failure, Coronary Artery Disease, Weeks Of Treatment: 35 History: Hypertension, Type II Diabetes, Seizure Disorder Clustered Wound: No Wound Measurements Length: (cm) 1 Width: (cm) 1 Depth: (cm) 0.1 Area: (cm) 0.785 Volume: (cm) 0.079 % Reduction in Area: 28.6% % Reduction in Volume: 28.2% Epithelialization: None Tunneling: No Undermining: No Wound Description Classification: Grade 2 Wound Margin: Flat and Intact Exudate Amount: Medium Exudate Type: Serosanguineous Exudate Color: red, brown Foul  Odor After Cleansing: No Slough/Fibrino Yes Wound Bed Granulation Amount: None Present (0%) Exposed Structure Necrotic Amount: Large (67-100%) Fascia Exposed: No Necrotic Quality: Eschar Fat Layer (Subcutaneous Tissue) Exposed: No Tendon Exposed: No Muscle Exposed: No Joint Exposed: No Bone Exposed: No Treatment Notes Wound #4 (Toe Great) Wound Laterality: Right Cleanser Wound Cleanser Discharge Instruction: Cleanse the wound with wound cleanser prior to applying a clean dressing using gauze sponges, not tissue or cotton balls. Peri-Wound Care Topical Primary Dressing Betadine Discharge Instruction: apply betadine to wound. Secondary Dressing Woven Gauze Sponges 2x2 in Discharge Instruction: Apply over primary dressing as directed. Secured With Conforming Stretch Gauze Bandage, Sterile 2x75 (in/in) Discharge Instruction: Secure with stretch gauze as directed. Compression Wrap Compression Stockings Add-Ons Electronic Signature(s) Signed: 06/20/2021 6:08:32 PM By: Rhae Hammock RN Entered By: Rhae Hammock on 06/20/2021 15:13:17 -------------------------------------------------------------------------------- Wound Assessment Details Patient Name: Date of Service: [name] 06/20/2021 2:30 PM Medical Record Number: [id number] Patient Account Number: [id number] Date of Birth/Sex: Treating RN: 1946/09/23 (75 y.o. [name], Lauren Primary Care Daequan Kozma: [name] Other Clinician: Referring Cordarious Zeek: Treating Mavis Gravelle/Extender: Theodosia Quay in Treatment: 46 Wound Status Wound Number: 6 Primary Diabetic Wound/Ulcer of the Lower Extremity Etiology: Wound Location: Right T Fourth oe Wound Open Wounding Event: Gradually Appeared Status: Date Acquired: 11/22/2020 Comorbid Congestive Heart Failure, Coronary Artery Disease, Weeks Of Treatment: 30 History: Hypertension, Type II Diabetes, Seizure Disorder Clustered Wound:  No Wound Measurements Length: (cm) 1 Width: (cm) 1 Depth: (cm) 0.1 Area: (cm) 0.785 Volume: (cm) 0.079 % Reduction in Area: -66.7% % Reduction in Volume: -68.1% Epithelialization: None Tunneling: No Undermining: No Wound Description Classification: Grade 2 Wound Margin: Distinct, outline attached Exudate Amount: Small Exudate Type: Purulent Exudate Color: yellow, brown, green Foul Odor After Cleansing: No Slough/Fibrino No Wound Bed Granulation Amount: None Present (0%) Exposed Structure Necrotic Amount: Large (67-100%) Fascia Exposed: No Necrotic  Quality: Eschar Fat Layer (Subcutaneous Tissue) Exposed: Yes Tendon Exposed: No Muscle Exposed: No Joint Exposed: No Bone Exposed: No Treatment Notes Wound #6 (Toe Fourth) Wound Laterality: Right Cleanser Wound Cleanser Discharge Instruction: Cleanse the wound with wound cleanser prior to applying a clean dressing using gauze sponges, not tissue or cotton balls. Peri-Wound Care Topical Primary Dressing Betadine Discharge Instruction: apply betadine to wound. Secondary Dressing Woven Gauze Sponges 2x2 in Discharge Instruction: Apply over primary dressing as directed. Secured With Conforming Stretch Gauze Bandage, Sterile 2x75 (in/in) Discharge Instruction: Secure with stretch gauze as directed. Compression Wrap Compression Stockings Add-Ons Electronic Signature(s) Signed: 06/20/2021 6:08:32 PM By: Rhae Hammock RN Entered By: Rhae Hammock on 06/20/2021 15:13:33 -------------------------------------------------------------------------------- Vitals Details Patient Name: Date of Service: [name] 06/20/2021 2:30 PM Medical Record Number: [id number] Patient Account Number: [id number] Date of Birth/Sex: Treating RN: [birth year] (75 y.o. [name], Lauren Primary Care Monty Spicher: [name] Other Clinician: Referring Aarush Stukey: Treating Burnette Sautter/Extender: Theodosia Quay  in Treatment: 46 Vital Signs Time Taken: 15:12 Temperature (F): 98.7 Height (in): 64 Pulse (bpm): 59 Weight (lbs): 150 Respiratory Rate (breaths/min): 17 Body Mass Index (BMI): 25.7 Blood Pressure (mmHg): 123/78 Capillary Blood Glucose (mg/dl): 127 Reference Range: 80 - 120 mg / dl Electronic Signature(s) Signed: 06/20/2021 6:08:32 PM By: Rhae Hammock RN Entered By: Rhae Hammock on 06/20/2021 15:12:46

## 2021-06-20 NOTE — Progress Notes (Signed)
HILLIARD, BALOUGH T ([id number]) . Visit Report for 06/20/2021 Chief Complaint Document Details Patient Name: Date of Service: JAKYLE, WOLIVER [employer] T. 06/20/2021 2:30 PM Medical Record Number: [id number] Patient Account Number: [id number] Date of Birth/Sex: Treating RN: Mar 09, 1946 (75 y.o. [name], Lauren Primary Care Provider: [name] Other Clinician: Referring Provider: Treating Provider/Extender: Theodosia Quay in [id number] Information Obtained from: Patient Chief Complaint Bilateral toe wounds Electronic Signature(s) Signed: 06/20/2021 3:55:32 PM By: Kalman Shan DO Entered By: Kalman Shan on 06/20/2021 15:48:22 -------------------------------------------------------------------------------- HPI Details Patient Name: Date of Service: [name] 06/20/2021 2:30 PM Medical Record Number: [id number] Patient Account Number: [id number] Date of Birth/Sex: Treating RN: 09-30-1946 (75 y.o. [name], Lauren Primary Care Provider: [name] Other Clinician: Referring Provider: Treating Provider/Extender: Theodosia Quay in Treatment: 30 History of Present Illness HPI Description: ADMISSION 07/27/2020 This is a 75 year old man who is accompanied by his wife (ex). Nevertheless she is his active caregiver. His most complicated features are remote CVA that left him with a left hemiparesis. He also has widely metastatic prostate cancer to bone which I think is contributed to increasing frailty this year and he is no longer ambulatory. He required hospitalization from 9/7 through 9/14 with respiratory failure seizures. During this time it was noted that he had an open area in the left buttock in close proximity to the gluteal cleft. His wife tells me that he has a history of perirectal abscesses. And she describes this area as starting as a painful swelling in late August certainly sounds like an abscess. When he is in  the hospital he required bedside IandD's but did not go to the OR. Has CT scan of the area showed soft tissue thickening in the perianal ulcer. At that point it was concerning for a fistula connection however a discrete abscess was not seen. Notable that he was hypoalbuminemic in the hospital but actually came out better with an albumin of 3.1 on 9/14 his wife says he is eating well. They are currently treating this with Santyl ointment and a backing wet-to-dry dressing. By description of his wife this is done quite nicely and there is certainly less debris over the wound surface. They already have a hospital bed with a level 3 pressure relief surface. The patient lies on his back when he is in bed especially at night although his wife is counseled him not to do this. They are starting sliding board transfers into his wheelchair I think with physical therapy. He has a Foley catheter in place Past medical history includes remote CVA, metastatic prostate cancer to large areas of his thoracic and lumbar spine and pelvis., Bilateral carotid artery stenosis, Parkinson's disease, type 2 diabetes, diastolic heart failure, CHF, hypertension and a recently discovered possible pancreatic mass in the head of the pancreas 08/16/2020 patient I admitted to the clinic 2-1/2 weeks ago. He had an abscess site on his left buttock that required an IandD while he was in the hospital in September. He has been using Santyl backing wet-to-dry. He is cared for her aerobically at home by his wife 2 concerning areas on his feet. A stage I pressure injury on the right heel this does not have an open wound. He also has on the lateral aspect of the left fourth toe a dry circular eschared wound. 12/2; monthly follow-up. Is a very disabled man who had an abscess in his left buttock and required an IandD. We have been using Santyl to  this area I changed to silver collagen last time he is here and this actually looks quite a bit  better The last time he was here his wife showed me an area on the left lateral fourth toe I think a pressure injury with his fifth toe. Been using Santyl here as well but not making much improvement As well she shows me he has an area on his right buttock which is very tiny pinpoint area but under illumination still obviously an open area. I wonder if this is more of a shear injury and transferring and a pressure injury per se 12/16; this is a very disabled man who has a left buttock area that required an IandD apparently an abscess. Since he has come here that he developed a area on the right buttock. His wife says that this is draining pus. Finally he has an area on the lateral part of the left fourth toe. We have been using silver collagen on the buttock and Iodoflex on the fourth toe 1/6; this is a disabled man we have been working on a left buttock area that was initially an IandD abscess injury. We have got this down to a small area in fact it appears to be epithelializing towards the base of the wound. When he was here last time he had a small draining area on the right buttock. I thought this might be a small cyst culture of the drainage was negative. I do not think this was an abscess at all While he has been here he has been developing areas on the toes on his left foot. He initially had one on the left fourth toe and then last time the right first toe. He is a type II diabetic. With he has a history of coronary artery disease. He complains of a lot of pain in the right foot 1/13; we have been working on areas on his left buttock and then on the right buttock. Both of these are healed today albeit the area on the left has some depth with skin is gone into a divot. Nevertheless I think this is a reasonable outcome. He arrived a week ago with ischemic looking wounds on his right fifth, right first and his left fourth toe. He went on to have arterial studies which we received today these  showed an ABI that was noncompressible on both sides however the great toe on the right had a TBI of 0.14 with a pressure of only 17. Monophasic waveforms on all the tibial vessels and including the popliteal on the left he had monophasic waveforms again noncompressible vessels and no recorded toe pressure. Clearly these are ischemic wounds. Fortunately he is not complaining of a lot of pain The patient has a complicated medical situation which includes widely metastatic prostate cancer to bone for which she is on palliative care he also has Parkinson's disease. I have a recent note from his neurologist at which time he asked whether they wish to de-escalate the Parkinson's regimen and the answer was no. The patient has a follow-up appointment with Dr. Gwenlyn Found on 1/28. I believe he is the patient's cardiologist but this appointment was made because of the vascular studies I think predominantly through Dr. Kennon Holter office. 2/10; patient went back to see Dr. Gwenlyn Found. He did not feel given his comorbidities that he was a candidate for endovascular therapy. His wife seems to agree with that. He has ischemic wounds on his right toes particularly the right fifth toe fourth toe  and first toe. He is not in a lot of pain unless these are manipulated 12/20/2020 on evaluation today patient appears to be doing well with regard to his feet all things considered. There does not appear to be any obvious signs of infection here. Most of the wounds are actually for the most part eschar and dry. With that being said the patient is not actually a candidate for revascularization according to Dr. Gwenlyn Found who he has seen previous. Nonetheless I think that for the time being Betadine or something of that sort just keep things clean and dry will be a good way to go. 3/24; patient has 3 areas on the dorsal right toes first fourth and fifth that are necrotic probably ischemic. He was not felt to be a candidate for revascularization  therapy. He also has an area on the left fourth toe in a similar situation. His significant other is concerned about possibility of an abscess on his buttock. He was put on antibiotics last time. I must say that when he was here before he seemed to have recurrent small abscesses but the purulence in this culture is negative I wondered about an epidermoid cyst although this area currently is not in the same spot 4/21; patient presents for his 1 month follow-up. He has chronic necrotic right toes. The affected toes include the first fourth and fifth digits. The third toe wound has closed. He uses Betadine on this daily. He has no complaints today. He denies fever chills, erythema to the wound sites. 5/19; patient presents for 1 month follow-up. He has had no issues in the past month with his bilateral toe wounds due to arterial insufficiency. He continues to use Betadine on these daily. He has no complaints today. He denies signs of infection. 6/30; patient presents for 6-week follow-up. He denies any issues. He has been using Betadine to the areas of necrosis on the right toes. He denies signs of infection. 8/25; patient presents for 61-monthfollow-up. He states that 1 week ago he developed some thick drainage from the fourth toe near the toenail. Patient has been putting antibiotic ointment to this area. He denies increased warmth or erythema to the fourth toe. He denies systemic signs of infection. He continues to use Betadine to the other necrotic areas. 9/1; patient states he has taken Bactrim for the past week. He noticed yellow drainage until 2 days ago when it stopped. He denies increased warmth or erythema to the area. He denies systemic signs of infection. 9/8; patient presents for follow-up. He has completed his second round of Bactrim. He has not noticed any more drainage. He overall feels well. He has no issues or complaints today. He denies signs of infection. Electronic  Signature(s) Signed: 06/20/2021 3:55:32 PM By: HKalman ShanDO Entered By: HKalman Shanon 06/20/2021 15:48:55 -------------------------------------------------------------------------------- Physical Exam Details Patient Name: Date of Service: CIsla PenceHN T. 06/20/2021 2:30 PM Medical Record Number: [id number] Account Number: [id number] of Birth/Sex: Treating RN: [birth year](75y.o. MErie NoePrimary Care Provider: WJonathon JordanOther Clinician: Referring Provider: Treating Provider/Extender: HTheodosia Quayin Treatment: 41Constitutional respirations regular, non-labored and within target range for patient..Marland KitchenPsychiatric pleasant and cooperative. Notes Right foot: He has an eschar to the dorsal first and fourth toes. I am unable to express drainage. No increased warmth or erythema to the fourth toe. Foot and leg are warm. Electronic Signature(s) Signed: 06/20/2021 3:55:32 PM By: HKalman ShanDO Entered By: HKalman Shan  on 06/20/2021 15:51:40 -------------------------------------------------------------------------------- Physician Orders Details Patient Name: Date of Service: SYLIS, TARZIA [facility] T. 06/20/2021 2:30 PM Medical Record Number: [id number] Patient Account Number: [id number] Date of Birth/Sex: Treating RN: 1946-01-27 (75 y.o. [name], Lauren Primary Care Provider: [name] Other Clinician: Referring Provider: Treating Provider/Extender: Theodosia Quay in Treatment: 26 Verbal / Phone Orders: No Diagnosis Coding ICD-10 Coding Code Description [phone] Non-pressure chronic ulcer of other part of left foot with other specified severity L97.514 Non-pressure chronic ulcer of other part of right foot with necrosis of bone E11.51 Type 2 diabetes mellitus with diabetic peripheral angiopathy without gangrene I73.9 Peripheral vascular disease, unspecified Follow-up  Appointments ppointment in: - 6 weeks w/ Dr. Heber [place] Return A Bathing/ Shower/ Hygiene May shower with protection but do not get wound dressing(s) wet. Edema Control - Lymphedema / SCD / Other Moisturize legs daily. Off-Loading Low air-loss mattress (Group 2) Turn and reposition every 2 hours Other: - CONTINUE TO USE BUNNY BOOTS FOR HEEL PROTECTION. UP IN WHEELCHAIR NO MORE THAT 2 HOUR INCREMENTS. Ensure to relieve pressure off buttock closed areas to prevent reopening. Home Health No change in wound care orders this week; continue Home Health for wound care. May utilize formulary equivalent dressing for wound treatment orders unless otherwise specified. Other Home Health Orders/Instructions: - [facility] Wound Treatment Wound #4 - T Great oe Wound Laterality: Right Cleanser: Wound Cleanser (Home Health) 1 x Per Day/30 Days Discharge Instructions: Cleanse the wound with wound cleanser prior to applying a clean dressing using gauze sponges, not tissue or cotton balls. Prim Dressing: Betadine 1 x Per Day/30 Days ary Discharge Instructions: apply betadine to wound. Secondary Dressing: Woven Gauze Sponges 2x2 in (Home Health) 1 x Per Day/30 Days Discharge Instructions: Apply over primary dressing as directed. Secured With: Child psychotherapist, Sterile 2x75 (in/in) (Home Health) 1 x Per Day/30 Days Discharge Instructions: Secure with stretch gauze as directed. Wound #6 - T Fourth oe Wound Laterality: Right Cleanser: Wound Cleanser (Home Health) 1 x Per Day/30 Days Discharge Instructions: Cleanse the wound with wound cleanser prior to applying a clean dressing using gauze sponges, not tissue or cotton balls. Prim Dressing: Betadine ary 1 x Per Day/30 Days Discharge Instructions: apply betadine to wound. Secondary Dressing: Woven Gauze Sponges 2x2 in (Home Health) 1 x Per Day/30 Days Discharge Instructions: Apply over primary dressing as directed. Secured With: Hotel manager, Sterile 2x75 (in/in) (Home Health) 1 x Per Day/30 Days Discharge Instructions: Secure with stretch gauze as directed. Electronic Signature(s) Signed: 06/20/2021 3:55:32 PM By: Kalman Shan DO Entered By: Kalman Shan on 06/20/2021 15:51:58 -------------------------------------------------------------------------------- Problem List Details Patient Name: Date of Service: [name] 06/20/2021 2:30 PM Medical Record Number: [id number] Patient Account Number: [id number] Date of Birth/Sex: Treating RN: 19-Oct-1945 (75 y.o. [name], Lauren Primary Care Provider: [name] Other Clinician: Referring Provider: Treating Provider/Extender: Theodosia Quay in Treatment: 46 Active Problems ICD-10 Encounter Code Description Active Date MDM Diagnosis L97.528 Non-pressure chronic ulcer of other part of left foot with other specified 08/16/2020 No Yes severity L97.514 Non-pressure chronic ulcer of other part of right foot with necrosis of bone 10/18/2020 No Yes E11.51 Type 2 diabetes mellitus with diabetic peripheral angiopathy without gangrene 10/18/2020 No Yes I73.9 Peripheral vascular disease, unspecified 06/06/2021 No Yes Inactive Problems ICD-10 Code Description Active Date Inactive Date L89.611 Pressure ulcer of right heel, stage 1 08/16/2020 08/16/2020 L89.312 Pressure ulcer of right buttock, stage 2 09/13/2020 09/13/2020 Resolved  Problems ICD-10 Code Description Active Date Resolved Date L02.31 Cutaneous abscess of buttock 07/27/2020 07/27/2020 S31.829D Unspecified open wound of left buttock, subsequent encounter 07/27/2020 07/27/2020 Electronic Signature(s) Signed: 06/20/2021 3:55:32 PM By: Kalman Shan DO Entered By: Kalman Shan on 06/20/2021 15:48:01 -------------------------------------------------------------------------------- Progress Note Details Patient Name: Date of Service: [name] 06/20/2021  2:30 PM Medical Record Number: [id number] Patient Account Number: [id number] Date of Birth/Sex: Treating RN: 01/09/1946 (75 y.o. [name], Lauren Primary Care Provider: [name] Other Clinician: Referring Provider: Treating Provider/Extender: Theodosia Quay in Treatment: 58 Subjective Chief Complaint Information obtained from Patient Bilateral toe wounds History of Present Illness (HPI) ADMISSION 07/27/2020 This is a 75 year old man who is accompanied by his wife (ex). Nevertheless she is his active caregiver. His most complicated features are remote CVA that left him with a left hemiparesis. He also has widely metastatic prostate cancer to bone which I think is contributed to increasing frailty this year and he is no longer ambulatory. He required hospitalization from 9/7 through 9/14 with respiratory failure seizures. During this time it was noted that he had an open area in the left buttock in close proximity to the gluteal cleft. His wife tells me that he has a history of perirectal abscesses. And she describes this area as starting as a painful swelling in late August certainly sounds like an abscess. When he is in the hospital he required bedside IandD's but did not go to the OR. Has CT scan of the area showed soft tissue thickening in the perianal ulcer. At that point it was concerning for a fistula connection however a discrete abscess was not seen. Notable that he was hypoalbuminemic in the hospital but actually came out better with an albumin of 3.1 on 9/14 his wife says he is eating well. They are currently treating this with Santyl ointment and a backing wet-to-dry dressing. By description of his wife this is done quite nicely and there is certainly less debris over the wound surface. They already have a hospital bed with a level 3 pressure relief surface. The patient lies on his back when he is in bed especially at night although his wife is  counseled him not to do this. They are starting sliding board transfers into his wheelchair I think with physical therapy. He has a Foley catheter in place Past medical history includes remote CVA, metastatic prostate cancer to large areas of his thoracic and lumbar spine and pelvis., Bilateral carotid artery stenosis, Parkinson's disease, type 2 diabetes, diastolic heart failure, CHF, hypertension and a recently discovered possible pancreatic mass in the head of the pancreas 08/16/2020 patient I admitted to the clinic 2-1/2 weeks ago. He had an abscess site on his left buttock that required an IandD while he was in the hospital in September. He has been using Santyl backing wet-to-dry. He is cared for her aerobically at home by his wife 2 concerning areas on his feet. A stage I pressure injury on the right heel this does not have an open wound. He also has on the lateral aspect of the left fourth toe a dry circular eschared wound. 12/2; monthly follow-up. Is a very disabled man who had an abscess in his left buttock and required an IandD. We have been using Santyl to this area I changed to silver collagen last time he is here and this actually looks quite a bit better ooThe last time he was here his wife showed me an area on the left  lateral fourth toe I think a pressure injury with his fifth toe. Been using Santyl here as well but not making much improvement ooAs well she shows me he has an area on his right buttock which is very tiny pinpoint area but under illumination still obviously an open area. I wonder if this is more of a shear injury and transferring and a pressure injury per se 12/16; this is a very disabled man who has a left buttock area that required an IandD apparently an abscess. Since he has come here that he developed a area on the right buttock. His wife says that this is draining pus. Finally he has an area on the lateral part of the left fourth toe. We have been using silver  collagen on the buttock and Iodoflex on the fourth toe 1/6; this is a disabled man we have been working on a left buttock area that was initially an IandD abscess injury. We have got this down to a small area in fact it appears to be epithelializing towards the base of the wound. When he was here last time he had a small draining area on the right buttock. I thought this might be a small cyst culture of the drainage was negative. I do not think this was an abscess at all While he has been here he has been developing areas on the toes on his left foot. He initially had one on the left fourth toe and then last time the right first toe. He is a type II diabetic. With he has a history of coronary artery disease. He complains of a lot of pain in the right foot 1/13; we have been working on areas on his left buttock and then on the right buttock. Both of these are healed today albeit the area on the left has some depth with skin is gone into a divot. Nevertheless I think this is a reasonable outcome. He arrived a week ago with ischemic looking wounds on his right fifth, right first and his left fourth toe. He went on to have arterial studies which we received today these showed an ABI that was noncompressible on both sides however the great toe on the right had a TBI of 0.14 with a pressure of only 17. Monophasic waveforms on all the tibial vessels and including the popliteal on the left he had monophasic waveforms again noncompressible vessels and no recorded toe pressure. Clearly these are ischemic wounds. Fortunately he is not complaining of a lot of pain The patient has a complicated medical situation which includes widely metastatic prostate cancer to bone for which she is on palliative care he also has Parkinson's disease. I have a recent note from his neurologist at which time he asked whether they wish to de-escalate the Parkinson's regimen and the answer was no. The patient has a follow-up  appointment with Dr. Gwenlyn Found on 1/28. I believe he is the patient's cardiologist but this appointment was made because of the vascular studies I think predominantly through Dr. Kennon Holter office. 2/10; patient went back to see Dr. Gwenlyn Found. He did not feel given his comorbidities that he was a candidate for endovascular therapy. His wife seems to agree with that. He has ischemic wounds on his right toes particularly the right fifth toe fourth toe and first toe. He is not in a lot of pain unless these are manipulated 12/20/2020 on evaluation today patient appears to be doing well with regard to his feet all things considered. There does  not appear to be any obvious signs of infection here. Most of the wounds are actually for the most part eschar and dry. With that being said the patient is not actually a candidate for revascularization according to Dr. Gwenlyn Found who he has seen previous. Nonetheless I think that for the time being Betadine or something of that sort just keep things clean and dry will be a good way to go. 3/24; patient has 3 areas on the dorsal right toes first fourth and fifth that are necrotic probably ischemic. He was not felt to be a candidate for revascularization therapy. He also has an area on the left fourth toe in a similar situation. His significant other is concerned about possibility of an abscess on his buttock. He was put on antibiotics last time. I must say that when he was here before he seemed to have recurrent small abscesses but the purulence in this culture is negative I wondered about an epidermoid cyst although this area currently is not in the same spot 4/21; patient presents for his 1 month follow-up. He has chronic necrotic right toes. The affected toes include the first fourth and fifth digits. The third toe wound has closed. He uses Betadine on this daily. He has no complaints today. He denies fever chills, erythema to the wound sites. 5/19; patient presents for 1 month  follow-up. He has had no issues in the past month with his bilateral toe wounds due to arterial insufficiency. He continues to use Betadine on these daily. He has no complaints today. He denies signs of infection. 6/30; patient presents for 6-week follow-up. He denies any issues. He has been using Betadine to the areas of necrosis on the right toes. He denies signs of infection. 8/25; patient presents for 57-monthfollow-up. He states that 1 week ago he developed some thick drainage from the fourth toe near the toenail. Patient has been putting antibiotic ointment to this area. He denies increased warmth or erythema to the fourth toe. He denies systemic signs of infection. He continues to use Betadine to the other necrotic areas. 9/1; patient states he has taken Bactrim for the past week. He noticed yellow drainage until 2 days ago when it stopped. He denies increased warmth or erythema to the area. He denies systemic signs of infection. 9/8; patient presents for follow-up. He has completed his second round of Bactrim. He has not noticed any more drainage. He overall feels well. He has no issues or complaints today. He denies signs of infection. Patient History Information obtained from Patient. Family History Unknown History. Social History Former smoker - quit 12 years ago, Marital Status - Divorced, Alcohol Use - Never, Drug Use - No History, Caffeine Use - Rarely. Medical History Cardiovascular Patient has history of Congestive Heart Failure, Coronary Artery Disease, Hypertension Endocrine Patient has history of Type II Diabetes Denies history of Type I Diabetes Neurologic Patient has history of Seizure Disorder Oncologic Denies history of Received Chemotherapy, Received Radiation Medical A Surgical History Notes nd Cardiovascular CVA, Carotid stenosis Genitourinary CKD stage 3 Neurologic Parkinson's, left hemiparesis, CVA Oncologic prostate cancer with mets to bone, bladder  cancer Objective Constitutional respirations regular, non-labored and within target range for patient.. Vitals Time Taken: 3:12 PM, Height: 64 in, Weight: 150 lbs, BMI: 25.7, Temperature: 98.7 F, Pulse: 59 bpm, Respiratory Rate: 17 breaths/min, Blood Pressure: 123/78 mmHg, Capillary Blood Glucose: 127 mg/dl. Psychiatric pleasant and cooperative. General Notes: Right foot: He has an eschar to the dorsal first and fourth  toes. I am unable to express drainage. No increased warmth or erythema to the fourth toe. Foot and leg are warm. Integumentary (Hair, Skin) Wound #4 status is Open. Original cause of wound was Not Known. The date acquired was: 10/11/2020. The wound has been in treatment 35 weeks. The wound is located on the Right T Great. The wound measures 1cm length x 1cm width x 0.1cm depth; 0.785cm^2 area and 0.079cm^3 volume. There is no tunneling oe or undermining noted. There is a medium amount of serosanguineous drainage noted. The wound margin is flat and intact. There is no granulation within the wound bed. There is a large (67-100%) amount of necrotic tissue within the wound bed including Eschar. Wound #6 status is Open. Original cause of wound was Gradually Appeared. The date acquired was: 11/22/2020. The wound has been in treatment 30 weeks. The wound is located on the Right T Fourth. The wound measures 1cm length x 1cm width x 0.1cm depth; 0.785cm^2 area and 0.079cm^3 volume. There is oe Fat Layer (Subcutaneous Tissue) exposed. There is no tunneling or undermining noted. There is a small amount of purulent drainage noted. The wound margin is distinct with the outline attached to the wound base. There is no granulation within the wound bed. There is a large (67-100%) amount of necrotic tissue within the wound bed including Eschar. 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 He no longer reports any drainage from the fourth right toe. I think at this time he can continue Betadine to the necrotic areas. He would like to follow-up every 2 months as previously done. He has home health as well. I think this is reasonable. We manage the patient to assure nothing progresses as he has significant peripheral arterial disease and is not a candidate for intervention. Plan Follow-up Appointments: Return Appointment in: - 6 weeks w/ Dr. Heber Denver Bathing/ Shower/ Hygiene: May shower with protection but do not get wound dressing(s) wet. Edema Control - Lymphedema / SCD / Other: Moisturize legs daily. Off-Loading: Low air-loss mattress (Group 2) Turn and reposition every 2 hours Other: - CONTINUE TO USE BUNNY BOOTS FOR HEEL PROTECTION. UP IN WHEELCHAIR NO MORE THAT 2 HOUR INCREMENTS. Ensure to relieve pressure off buttock closed areas to prevent reopening. Home Health: No change in wound care orders this week; continue Home Health for wound care. May utilize formulary equivalent dressing for wound treatment orders unless otherwise specified. Other Home Health Orders/Instructions: - [facility] WOUND #4: - T Great Wound Laterality: Right oe Cleanser: Wound Cleanser (Home Health) 1 x Per Day/30 Days Discharge Instructions: Cleanse the wound with wound cleanser prior to applying a clean dressing using gauze sponges, not tissue or cotton balls. Prim Dressing: Betadine 1 x Per Day/30 Days ary Discharge Instructions: apply betadine to wound. Secondary Dressing: Woven Gauze Sponges 2x2 in (Home Health) 1 x Per Day/30 Days Discharge Instructions: Apply over primary dressing as directed. Secured With: Child psychotherapist, Sterile 2x75  (in/in) (Home Health) 1 x Per Day/30 Days Discharge Instructions: Secure with stretch gauze as directed. WOUND #6: - T Fourth Wound Laterality: Right oe Cleanser: Wound Cleanser (Home Health) 1 x Per Day/30 Days Discharge Instructions: Cleanse the wound with wound cleanser prior to  applying a clean dressing using gauze sponges, not tissue or cotton balls. Prim Dressing: Betadine 1 x Per Day/30 Days ary Discharge Instructions: apply betadine to wound. Secondary Dressing: Woven Gauze Sponges 2x2 in (Home Health) 1 x Per Day/30 Days Discharge Instructions: Apply over primary dressing as directed. Secured With: Child psychotherapist, Sterile 2x75 (in/in) (Home Health) 1 x Per Day/30 Days Discharge Instructions: Secure with stretch gauze as directed. 1. Continue Betadine 2. Follow-up in 2 months 3. Can call with any questions or concerns and can be seen sooner if needed Electronic Signature(s) Signed: 06/20/2021 3:55:32 PM By: Kalman Shan DO Entered By: Kalman Shan on 06/20/2021 15:54:13 -------------------------------------------------------------------------------- HxROS Details Patient Name: Date of Service: [name] 06/20/2021 2:30 PM Medical Record Number: [id number] Patient Account Number: [id number] Date of Birth/Sex: Treating RN: [birth year] (75 y.o. [name], Lauren Primary Care Provider: [name] Other Clinician: Referring Provider: Treating Provider/Extender: Theodosia Quay in Treatment: 59 Information Obtained From Patient Cardiovascular Medical History: Positive for: Congestive Heart Failure; Coronary Artery Disease; Hypertension Past Medical History Notes: CVA, Carotid stenosis Endocrine Medical History: Positive for: Type II Diabetes Negative for: Type I Diabetes Time with diabetes: 5 years ago Treated with: Insulin Blood sugar tested every day: Yes Tested : once a day Genitourinary Medical  History: Past Medical History Notes: CKD stage 3 Neurologic Medical History: Positive for: Seizure Disorder Past Medical History Notes: Parkinson's, left hemiparesis, CVA Oncologic Medical History: Negative for: Received Chemotherapy; Received Radiation Past Medical History Notes: prostate cancer with mets to bone, bladder cancer Immunizations Pneumococcal Vaccine: Received Pneumococcal Vaccination: Yes Received Pneumococcal Vaccination On or After 60th Birthday: No Implantable Devices None Family and Social History Unknown History: Yes; Former smoker - quit 12 years ago; Marital Status - Divorced; Alcohol Use: Never; Drug Use: No History; Caffeine Use: Rarely; Financial Concerns: No; Food, Clothing or Shelter Needs: No; Support System Lacking: No; Transportation Concerns: No Electronic Signature(s) Signed: 06/20/2021 3:55:32 PM By: Kalman Shan DO Signed: 06/20/2021 6:08:32 PM By: Rhae Hammock RN Entered By: Kalman Shan on 06/20/2021 15:50:05 -------------------------------------------------------------------------------- SuperBill Details Patient Name: Date of Service: Alycia Patten T. 06/20/2021 Medical Record Number: [id number] Patient Account Number: [id number] Date of Birth/Sex: Treating RN: 1946/08/12 (75 y.o. [name], Lauren Primary Care Provider: [name] Other Clinician: Referring Provider: Treating Provider/Extender: Theodosia Quay in Treatment: 46 Diagnosis Coding ICD-10 Codes Code Description [phone] Non-pressure chronic ulcer of other part of left foot with other specified severity L97.514 Non-pressure chronic ulcer of other part of right foot with necrosis of bone E11.51 Type 2 diabetes mellitus with diabetic peripheral angiopathy without gangrene Facility Procedures CPT4 Code: [id number] Description: 99213 - WOUND CARE VISIT-LEV 3 EST PT Modifier: Quantity: 1 Physician Procedures : CPT4 Code Description  Modifier [id number] - WC PHYS LEVEL 3 - EST PT ICD-10 Diagnosis Description L97.514 Non-pressure chronic ulcer of other part of right foot with necrosis of bone E11.51 Type 2 diabetes mellitus with diabetic peripheral  angiopathy without gangrene Quantity: 1 Electronic Signature(s) Signed: 06/20/2021 3:55:32 PM By: Kalman Shan DO Entered By: Kalman Shan on 06/20/2021 15:55:05

## 2021-06-24 ENCOUNTER — Other Ambulatory Visit: Payer: Self-pay

## 2021-06-24 DIAGNOSIS — G20 Parkinson's disease: Secondary | ICD-10-CM | POA: Diagnosis not present

## 2021-06-24 DIAGNOSIS — Z466 Encounter for fitting and adjustment of urinary device: Secondary | ICD-10-CM | POA: Diagnosis not present

## 2021-06-24 DIAGNOSIS — I130 Hypertensive heart and chronic kidney disease with heart failure and stage 1 through stage 4 chronic kidney disease, or unspecified chronic kidney disease: Secondary | ICD-10-CM | POA: Diagnosis not present

## 2021-06-24 DIAGNOSIS — S90411D Abrasion, right great toe, subsequent encounter: Secondary | ICD-10-CM | POA: Diagnosis not present

## 2021-06-24 DIAGNOSIS — R339 Retention of urine, unspecified: Secondary | ICD-10-CM | POA: Diagnosis not present

## 2021-06-24 DIAGNOSIS — E1122 Type 2 diabetes mellitus with diabetic chronic kidney disease: Secondary | ICD-10-CM | POA: Diagnosis not present

## 2021-06-24 DIAGNOSIS — N1832 Chronic kidney disease, stage 3b: Secondary | ICD-10-CM | POA: Diagnosis not present

## 2021-06-24 DIAGNOSIS — L89892 Pressure ulcer of other site, stage 2: Secondary | ICD-10-CM | POA: Diagnosis not present

## 2021-06-24 DIAGNOSIS — R1310 Dysphagia, unspecified: Secondary | ICD-10-CM | POA: Diagnosis not present

## 2021-06-24 DIAGNOSIS — I5032 Chronic diastolic (congestive) heart failure: Secondary | ICD-10-CM | POA: Diagnosis not present

## 2021-06-24 DIAGNOSIS — Z794 Long term (current) use of insulin: Secondary | ICD-10-CM | POA: Diagnosis not present

## 2021-06-24 DIAGNOSIS — I69354 Hemiplegia and hemiparesis following cerebral infarction affecting left non-dominant side: Secondary | ICD-10-CM | POA: Diagnosis not present

## 2021-06-24 MED ORDER — ROSUVASTATIN CALCIUM 20 MG PO TABS: 20.0000 mg | ORAL_TABLET | Freq: Every day | ORAL | 3 refills | Status: DC

## 2021-07-03 DIAGNOSIS — I5032 Chronic diastolic (congestive) heart failure: Secondary | ICD-10-CM | POA: Diagnosis not present

## 2021-07-03 DIAGNOSIS — Z794 Long term (current) use of insulin: Secondary | ICD-10-CM | POA: Diagnosis not present

## 2021-07-03 DIAGNOSIS — S90411D Abrasion, right great toe, subsequent encounter: Secondary | ICD-10-CM | POA: Diagnosis not present

## 2021-07-03 DIAGNOSIS — G20 Parkinson's disease: Secondary | ICD-10-CM | POA: Diagnosis not present

## 2021-07-03 DIAGNOSIS — E1122 Type 2 diabetes mellitus with diabetic chronic kidney disease: Secondary | ICD-10-CM | POA: Diagnosis not present

## 2021-07-03 DIAGNOSIS — I130 Hypertensive heart and chronic kidney disease with heart failure and stage 1 through stage 4 chronic kidney disease, or unspecified chronic kidney disease: Secondary | ICD-10-CM | POA: Diagnosis not present

## 2021-07-03 DIAGNOSIS — Z466 Encounter for fitting and adjustment of urinary device: Secondary | ICD-10-CM | POA: Diagnosis not present

## 2021-07-03 DIAGNOSIS — L89892 Pressure ulcer of other site, stage 2: Secondary | ICD-10-CM | POA: Diagnosis not present

## 2021-07-03 DIAGNOSIS — N1832 Chronic kidney disease, stage 3b: Secondary | ICD-10-CM | POA: Diagnosis not present

## 2021-07-03 DIAGNOSIS — I69354 Hemiplegia and hemiparesis following cerebral infarction affecting left non-dominant side: Secondary | ICD-10-CM | POA: Diagnosis not present

## 2021-07-04 ENCOUNTER — Telehealth: Payer: Self-pay | Admitting: Cardiovascular Disease

## 2021-07-04 DIAGNOSIS — C7919 Secondary malignant neoplasm of other urinary organs: Secondary | ICD-10-CM | POA: Diagnosis not present

## 2021-07-04 DIAGNOSIS — R338 Other retention of urine: Secondary | ICD-10-CM | POA: Diagnosis not present

## 2021-07-04 DIAGNOSIS — C672 Malignant neoplasm of lateral wall of bladder: Secondary | ICD-10-CM | POA: Diagnosis not present

## 2021-07-04 DIAGNOSIS — C7951 Secondary malignant neoplasm of bone: Secondary | ICD-10-CM | POA: Diagnosis not present

## 2021-07-04 NOTE — Telephone Encounter (Signed)
There is no interaction between Nubeqa and Eliquis or his other meds, he's ok to start taking this.

## 2021-07-04 NOTE — Telephone Encounter (Signed)
Pt c/o medication issue:  1. Name of Medication:  Nubeqa  2. How are you currently taking this medication (dosage and times per day)?  Patient hasn't started   3. Are you having a reaction (difficulty breathing--STAT)?   4. What is your medication issue?   Patient's caregiver, [name], states patient's urologist, Dr. Tresa Moore is recommending the patient start taking this medication. However, she is concerned that it may cause an interaction with his Eliquis. She would like to know what Dr. Gwenlyn Found recommends. Please advise.

## 2021-07-04 NOTE — Telephone Encounter (Signed)
Attempted to contact number provided but continue to receive a busy signal.

## 2021-07-09 ENCOUNTER — Other Ambulatory Visit: Payer: Self-pay | Admitting: *Deleted

## 2021-07-09 MED ORDER — AMLODIPINE BESYLATE 10 MG PO TABS: 10.0000 mg | ORAL_TABLET | Freq: Every day | ORAL | 2 refills | Status: DC

## 2021-07-09 NOTE — Telephone Encounter (Signed)
Pt's caregiver aware of response ./cy

## 2021-07-17 DIAGNOSIS — E1122 Type 2 diabetes mellitus with diabetic chronic kidney disease: Secondary | ICD-10-CM | POA: Diagnosis not present

## 2021-07-17 DIAGNOSIS — I130 Hypertensive heart and chronic kidney disease with heart failure and stage 1 through stage 4 chronic kidney disease, or unspecified chronic kidney disease: Secondary | ICD-10-CM | POA: Diagnosis not present

## 2021-07-17 DIAGNOSIS — I5032 Chronic diastolic (congestive) heart failure: Secondary | ICD-10-CM | POA: Diagnosis not present

## 2021-07-17 DIAGNOSIS — I69354 Hemiplegia and hemiparesis following cerebral infarction affecting left non-dominant side: Secondary | ICD-10-CM | POA: Diagnosis not present

## 2021-07-17 DIAGNOSIS — N1832 Chronic kidney disease, stage 3b: Secondary | ICD-10-CM | POA: Diagnosis not present

## 2021-07-17 DIAGNOSIS — S90411D Abrasion, right great toe, subsequent encounter: Secondary | ICD-10-CM | POA: Diagnosis not present

## 2021-07-17 DIAGNOSIS — Z466 Encounter for fitting and adjustment of urinary device: Secondary | ICD-10-CM | POA: Diagnosis not present

## 2021-07-17 DIAGNOSIS — Z794 Long term (current) use of insulin: Secondary | ICD-10-CM | POA: Diagnosis not present

## 2021-07-17 DIAGNOSIS — G20 Parkinson's disease: Secondary | ICD-10-CM | POA: Diagnosis not present

## 2021-07-17 DIAGNOSIS — L89892 Pressure ulcer of other site, stage 2: Secondary | ICD-10-CM | POA: Diagnosis not present

## 2021-07-23 DIAGNOSIS — L02416 Cutaneous abscess of left lower limb: Secondary | ICD-10-CM | POA: Diagnosis not present

## 2021-07-24 ENCOUNTER — Other Ambulatory Visit: Payer: Self-pay

## 2021-07-24 ENCOUNTER — Other Ambulatory Visit: Payer: Medicare Other

## 2021-07-24 ENCOUNTER — Other Ambulatory Visit: Payer: Medicare Other | Admitting: *Deleted

## 2021-07-24 VITALS — BP 150/65 | HR 61 | Temp 97.9°F | Resp 16

## 2021-07-24 DIAGNOSIS — Z515 Encounter for palliative care: Secondary | ICD-10-CM

## 2021-07-24 NOTE — Progress Notes (Signed)
[facility] COMMUNITY PALLIATIVE CARE RN NOTE  PATIENT NAME: [name] DOB: [birth year] MRN: [id number]  PRIMARY CARE PROVIDER: [name], MD  RESPONSIBLE PARTY:  Acct ID - Guarantor Home Phone Work Phone Relationship Acct Type  [id number] DAOUDA, [id number]  Self P/F     [place], [place], [place] [address]   Covid-19 Pre-screening Negative  PLAN OF CARE and INTERVENTION:  ADVANCE CARE PLANNING/GOALS OF CARE: Goal is for patient to remain in his home and have a good quality of life. He is a Full code.  PATIENT/CAREGIVER EDUCATION: Symptom management, safe mobility/transfers, s/s of infection DISEASE STATUS: Joint follow-up visit made with LCSW, M. Lonon. Met with patient and his ex-wife in their home. Patient is lying in bed awake and alert x 4. Pleasant mood and engaging. The most recent issue is that last Monday patient was found to have a peri-anal abscess and was placed on Clindamycin. It opened up and started draining 3 days ago after caregiver placed a red onion over it along with a drawing salve called MG 217 (10% ichtammol). She took patient to [facility] urgent care walk-in clinic yesterday and he was placed on Clindamycin for 5 more days and was advised to return in 3 days. Today area looks like it is healing well. Small amount of sanguineous drainage noted. No surrounding redness. Patient denies pain or irritation to this area. He has a wound on his right 5th toe. RN visits every 2 weeks with [facility] home [facility] to assess. Caregiver does daily dressing changes. He is wearing heel protectors. 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 He had a wound on his left 5th toe, but this one has healed. 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 He is scheduled to have a Mohs procedure on the 20th. He is paralyzed on his left side from a CVA in 1999. He  has a history of seizures managed with Keppra. His last seizure was in 2009. His appetite is good. He denies dysphagia. His blood sugar is 137 today. He receives insulin once daily around 12p-1p. He is total care for ADLs. He transfers using a sliding board and 1 person assistance. He has a motorized chair that was approved by Medicare and a handicap accessible Lucianne Lei that they received from [facility] for [facility] which has been a tremendous help and improved patient's overall quality of life. He has prostate cancer with mets to the thoracic spine and pelvis. Initially he has a PSA of over 140. He was started on Eligard injections every 6 months. His PSA had decreased down to 0.12 but has now increased to 1.68. Caregiver states that it has been suggested by another Urologist for patient to try Nubeqa. Patient is undecided. He is concerned about the possible side effects of seizures, heart attack or death. The cost of this medication is also a concern. They have an appointment on October 17, 2021 with Urology for lab work then a follow up on the 12th to discuss lab results. Next follow-up visit scheduled for 5 weeks.  HISTORY OF PRESENT ILLNESS: This is a 75 yo male with a diagnosis of metastatic prostate cancer. He has a history of Parkinson's disease, seizure disorder, essential tremor, spastic hemiplegia (left side), carotid stenosis, hypertension, CHF, DM II, bladder cancer and CKD Stage 3a. Palliative care continues to follow patient for additional support, goals  of care and complex decision making.  CODE STATUS: Full code ADVANCED DIRECTIVES: Y MOST FORM: yes PPS: 30%   PHYSICAL EXAM:   VITALS: Today's Vitals   07/24/21 1130  BP: (!) 150/65  Pulse: 61  Resp: 16  Temp: 97.9 F (36.6 C)  TempSrc: Temporal  SpO2: 99%  PainSc: 0-No pain    LUNGS: clear to auscultation  CARDIAC: Cor RRR EXTREMITIES: No edema SKIN:  peri-anal abscess, wound to left 5th toe   NEURO:  Alert and oriented x 4, pleasant  mood and engaging, total care with ADLs, wheelchair/chair bound   (Duration of visit and documentation 75 minutes)   Daryl Eastern, RN BSN

## 2021-07-24 NOTE — Progress Notes (Signed)
COMMUNITY PALLIATIVE CARE SW NOTE  PATIENT NAME: [name] DOB: 01/13/1946 MRN: [id number]  PRIMARY CARE PROVIDER: [name], MD  RESPONSIBLE PARTY:  Acct ID - Guarantor Home Phone Work Phone Relationship Acct Type  [id number] GARNELL, [phone]  Self P/F     [place], [place], [place] [address]     PLAN OF CARE and INTERVENTIONS:             GOALS OF CARE/ ADVANCE CARE PLANNING:  Goal is for patient to remain in his home. Patient desires to be a FULL CODE.  SOCIAL/EMOTIONAL/SPIRITUAL ASSESSMENT/ INTERVENTIONS:  SW and RN-M. Howard completed initial palliative care visit with patient at his home. He was present with his ex-wife/PCG-Jackie. Patient was in the hospital bed, awake and alert. Education was provided regarding palliative care services and verbal consent was provided. PCG provided a status update on patient. Patient is being treated for a perianal abscess.  That was discovered over a week ago. He goes to a wound care center to treat his right toe. Patient is paralyzed on his left side since 1999 as a result of a stroke. He is at risk for seizures that can be triggered by flashing lights. Patient receives RN services through [facility] for wound care and changing his foley cathter. Patient has small area on his forehead where he had a small squamos cell removed. Patient is also being treated for prostate cancer that has now metastasized. Patient is dependent for all ADL's. He has a Transport planner that he utilizes to ambulate outside the home. Patient is bedbound at home. His appetite is good and he is having no swallowing issues. Patient expressed his desire to be a Full Code. Patient and his ex-wife have been divorced since 1983, however she is committed to care for patient as long as she is able to. Patient is originally from [place]. He owned a Radiation protection practitioner. He has four children. His ex-wife serves as his HCPOA. SW provided assessment of patient needs and  comfort, observation, education, supportive presence, and reassurance of support. Patient and PCG are open to ongoing palliative care support.  PATIENT/CAREGIVER EDUCATION/ COPING:  Patient was alert and oriented x3. He was engaged, but his verbalizations were delayed as he is hard of hearing. He has a supportive family and committed PCG.  PERSONAL EMERGENCY PLAN:  911 can be activated for emergencies. COMMUNITY RESOURCES COORDINATION/ HEALTH CARE NAVIGATION:  RN support through [facility]. FINANCIAL/LEGAL CONCERNS/INTERVENTIONS:  None.    SOCIAL HX:  Social History   Tobacco Use   Smoking status: Former    Packs/day: 0.25    Years: 5.00    Pack years: 1.25    Types: Cigarettes    Quit date: 11/14/2007    Years since quitting: 13.7   Smokeless tobacco: Never  Substance Use Topics   Alcohol use: No    CODE STATUS: Full Code ADVANCED DIRECTIVES: No MOST FORM COMPLETE:  Yes HOSPICE EDUCATION PROVIDED: No  PPS: Patient is alert and oriented x3. He is bedbound and dependent for all ADL's.   Duration of visit and documentation: 75 minutes.  [address] [place], [place]

## 2021-07-31 DIAGNOSIS — L89892 Pressure ulcer of other site, stage 2: Secondary | ICD-10-CM | POA: Diagnosis not present

## 2021-07-31 DIAGNOSIS — I130 Hypertensive heart and chronic kidney disease with heart failure and stage 1 through stage 4 chronic kidney disease, or unspecified chronic kidney disease: Secondary | ICD-10-CM | POA: Diagnosis not present

## 2021-07-31 DIAGNOSIS — N1832 Chronic kidney disease, stage 3b: Secondary | ICD-10-CM | POA: Diagnosis not present

## 2021-07-31 DIAGNOSIS — S90411D Abrasion, right great toe, subsequent encounter: Secondary | ICD-10-CM | POA: Diagnosis not present

## 2021-07-31 DIAGNOSIS — Z794 Long term (current) use of insulin: Secondary | ICD-10-CM | POA: Diagnosis not present

## 2021-07-31 DIAGNOSIS — I5032 Chronic diastolic (congestive) heart failure: Secondary | ICD-10-CM | POA: Diagnosis not present

## 2021-07-31 DIAGNOSIS — G20 Parkinson's disease: Secondary | ICD-10-CM | POA: Diagnosis not present

## 2021-07-31 DIAGNOSIS — E1122 Type 2 diabetes mellitus with diabetic chronic kidney disease: Secondary | ICD-10-CM | POA: Diagnosis not present

## 2021-07-31 DIAGNOSIS — I69354 Hemiplegia and hemiparesis following cerebral infarction affecting left non-dominant side: Secondary | ICD-10-CM | POA: Diagnosis not present

## 2021-07-31 DIAGNOSIS — Z466 Encounter for fitting and adjustment of urinary device: Secondary | ICD-10-CM | POA: Diagnosis not present

## 2021-08-08 DIAGNOSIS — C7982 Secondary malignant neoplasm of genital organs: Secondary | ICD-10-CM | POA: Diagnosis not present

## 2021-08-08 DIAGNOSIS — K611 Rectal abscess: Secondary | ICD-10-CM | POA: Diagnosis not present

## 2021-08-09 DIAGNOSIS — Z794 Long term (current) use of insulin: Secondary | ICD-10-CM | POA: Diagnosis not present

## 2021-08-09 DIAGNOSIS — E1122 Type 2 diabetes mellitus with diabetic chronic kidney disease: Secondary | ICD-10-CM | POA: Diagnosis not present

## 2021-08-09 DIAGNOSIS — I5032 Chronic diastolic (congestive) heart failure: Secondary | ICD-10-CM | POA: Diagnosis not present

## 2021-08-09 DIAGNOSIS — L89892 Pressure ulcer of other site, stage 2: Secondary | ICD-10-CM | POA: Diagnosis not present

## 2021-08-09 DIAGNOSIS — I130 Hypertensive heart and chronic kidney disease with heart failure and stage 1 through stage 4 chronic kidney disease, or unspecified chronic kidney disease: Secondary | ICD-10-CM | POA: Diagnosis not present

## 2021-08-09 DIAGNOSIS — S90411D Abrasion, right great toe, subsequent encounter: Secondary | ICD-10-CM | POA: Diagnosis not present

## 2021-08-09 DIAGNOSIS — N1832 Chronic kidney disease, stage 3b: Secondary | ICD-10-CM | POA: Diagnosis not present

## 2021-08-09 DIAGNOSIS — Z466 Encounter for fitting and adjustment of urinary device: Secondary | ICD-10-CM | POA: Diagnosis not present

## 2021-08-09 DIAGNOSIS — I69354 Hemiplegia and hemiparesis following cerebral infarction affecting left non-dominant side: Secondary | ICD-10-CM | POA: Diagnosis not present

## 2021-08-09 DIAGNOSIS — G20 Parkinson's disease: Secondary | ICD-10-CM | POA: Diagnosis not present

## 2021-08-12 DIAGNOSIS — G20 Parkinson's disease: Secondary | ICD-10-CM | POA: Diagnosis not present

## 2021-08-12 DIAGNOSIS — I2510 Atherosclerotic heart disease of native coronary artery without angina pectoris: Secondary | ICD-10-CM | POA: Diagnosis not present

## 2021-08-12 DIAGNOSIS — E1122 Type 2 diabetes mellitus with diabetic chronic kidney disease: Secondary | ICD-10-CM | POA: Diagnosis not present

## 2021-08-12 DIAGNOSIS — I639 Cerebral infarction, unspecified: Secondary | ICD-10-CM | POA: Diagnosis not present

## 2021-08-12 DIAGNOSIS — I10 Essential (primary) hypertension: Secondary | ICD-10-CM | POA: Diagnosis not present

## 2021-08-12 DIAGNOSIS — I5032 Chronic diastolic (congestive) heart failure: Secondary | ICD-10-CM | POA: Diagnosis not present

## 2021-08-12 DIAGNOSIS — N183 Chronic kidney disease, stage 3 unspecified: Secondary | ICD-10-CM | POA: Diagnosis not present

## 2021-08-13 DIAGNOSIS — R339 Retention of urine, unspecified: Secondary | ICD-10-CM | POA: Diagnosis not present

## 2021-08-15 ENCOUNTER — Encounter (HOSPITAL_BASED_OUTPATIENT_CLINIC_OR_DEPARTMENT_OTHER): Payer: Medicare Other | Attending: Internal Medicine | Admitting: Internal Medicine

## 2021-08-15 ENCOUNTER — Other Ambulatory Visit: Payer: Self-pay

## 2021-08-15 DIAGNOSIS — G20 Parkinson's disease: Secondary | ICD-10-CM | POA: Insufficient documentation

## 2021-08-15 DIAGNOSIS — N183 Chronic kidney disease, stage 3 unspecified: Secondary | ICD-10-CM | POA: Diagnosis not present

## 2021-08-15 DIAGNOSIS — E11621 Type 2 diabetes mellitus with foot ulcer: Secondary | ICD-10-CM | POA: Diagnosis not present

## 2021-08-15 DIAGNOSIS — L97528 Non-pressure chronic ulcer of other part of left foot with other specified severity: Secondary | ICD-10-CM | POA: Diagnosis not present

## 2021-08-15 DIAGNOSIS — E1151 Type 2 diabetes mellitus with diabetic peripheral angiopathy without gangrene: Secondary | ICD-10-CM | POA: Insufficient documentation

## 2021-08-15 DIAGNOSIS — L97514 Non-pressure chronic ulcer of other part of right foot with necrosis of bone: Secondary | ICD-10-CM | POA: Insufficient documentation

## 2021-08-15 DIAGNOSIS — I739 Peripheral vascular disease, unspecified: Secondary | ICD-10-CM

## 2021-08-15 DIAGNOSIS — E1122 Type 2 diabetes mellitus with diabetic chronic kidney disease: Secondary | ICD-10-CM | POA: Diagnosis not present

## 2021-08-15 DIAGNOSIS — I130 Hypertensive heart and chronic kidney disease with heart failure and stage 1 through stage 4 chronic kidney disease, or unspecified chronic kidney disease: Secondary | ICD-10-CM | POA: Diagnosis not present

## 2021-08-15 DIAGNOSIS — L0231 Cutaneous abscess of buttock: Secondary | ICD-10-CM | POA: Insufficient documentation

## 2021-08-15 DIAGNOSIS — I5032 Chronic diastolic (congestive) heart failure: Secondary | ICD-10-CM | POA: Diagnosis not present

## 2021-08-16 ENCOUNTER — Telehealth: Payer: Self-pay | Admitting: Cardiovascular Disease

## 2021-08-16 MED ORDER — FENOFIBRATE 48 MG PO TABS: 48.0000 mg | ORAL_TABLET | Freq: Every day | ORAL | 3 refills | Status: AC

## 2021-08-16 NOTE — Telephone Encounter (Signed)
*  STAT* If patient is at the pharmacy, call can be transferred to refill team.   1. Which medications need to be refilled? (please list name of each medication and dose if known) fenofibrate (TRICOR) 48 MG tablet  2. Which pharmacy/location (including street and city if local pharmacy) is medication to be sent to? [facility] [id number], [address]  3. Do they need a 30 day or 90 day supply? [place]

## 2021-08-16 NOTE — Telephone Encounter (Signed)
Advised caregiver Rx sent to pharmacy

## 2021-08-20 NOTE — Progress Notes (Signed)
[name] ([id number]) . Visit Report for 08/15/2021 Arrival Information Details Patient Name: Date of Service: [name], [name] [facility] [name]. 08/15/2021 1:45 PM Medical Record Number: [id number] Patient Account Number: [id number] Date of Birth/Sex: Treating RN: January 29, 1946 (75 y.o. [name] Primary Care [name]: [name] Other Clinician: Referring [name]: Treating [name]/Extender: [name] in Treatment: 16 Visit Information History Since Last Visit Added or deleted any medications: No Patient Arrived: Wheel Chair Any new allergies or adverse reactions: No Arrival Time: 14:06 Had a fall or experienced change in No Accompanied By: spouse activities of daily living that may affect Transfer Assistance: None risk of falls: Patient Identification Verified: Yes Signs or symptoms of abuse/neglect since last visito No Secondary Verification Process Completed: Yes Hospitalized since last visit: No Patient Requires Transmission-Based Precautions: No Implantable device outside of the clinic excluding No Patient Has Alerts: Yes cellular tissue based products placed in the center Patient Alerts: Patient on Blood Thinner since last visit: Has Dressing in Place as Prescribed: Yes Pain Present Now: No Electronic Signature(s) Signed: 08/15/2021 6:31:34 PM By: Lorrin Jackson Entered By: Lorrin Jackson on 08/15/2021 14:08:12 -------------------------------------------------------------------------------- Clinic Level of Care Assessment Details Patient Name: Date of Service: [name], [name] [facility] [name]. 08/15/2021 1:45 PM Medical Record Number: [id number] Patient Account Number: [id number] Date of Birth/Sex: Treating RN: 02-Nov-1945 (75 y.o. [name] Primary Care [name]: [name] Other Clinician: Referring [name]: Treating [name]/Extender: [name] in Treatment: 2 Clinic Level of Care Assessment Items TOOL 4  Quantity Score X- 1 0 Use when only an EandM is performed on FOLLOW-UP visit ASSESSMENTS - Nursing Assessment / Reassessment X- 1 10 Reassessment of Co-morbidities (includes updates in patient status) X- 1 5 Reassessment of Adherence to Treatment Plan ASSESSMENTS - Wound and Skin A ssessment / Reassessment []  - 0 Simple Wound Assessment / Reassessment - one wound X- 2 5 Complex Wound Assessment / Reassessment - multiple wounds []  - 0 Dermatologic / Skin Assessment (not related to wound area) ASSESSMENTS - Focused Assessment []  - 0 Circumferential Edema Measurements - multi extremities []  - 0 Nutritional Assessment / Counseling / Intervention []  - 0 Lower Extremity Assessment (monofilament, tuning fork, pulses) []  - 0 Peripheral Arterial Disease Assessment (using hand held doppler) ASSESSMENTS - Ostomy and/or Continence Assessment and Care []  - 0 Incontinence Assessment and Management []  - 0 Ostomy Care Assessment and Management (repouching, etc.) PROCESS - Coordination of Care []  - 0 Simple Patient / Family Education for ongoing care X- 1 20 Complex (extensive) Patient / Family Education for ongoing care []  - 0 Staff obtains Programmer, systems, Records, [name] Results / Process Orders est X- 1 10 Staff telephones HHA, Nursing Homes / Clarify orders / etc []  - 0 Routine Transfer to another Facility (non-emergent condition) []  - 0 Routine Hospital Admission (non-emergent condition) []  - 0 New Admissions / Biomedical engineer / Ordering NPWT Apligraf, etc. , []  - 0 Emergency Hospital Admission (emergent condition) []  - 0 Simple Discharge Coordination []  - 0 Complex (extensive) Discharge Coordination PROCESS - Special Needs []  - 0 Pediatric / Minor Patient Management []  - 0 Isolation Patient Management []  - 0 Hearing / Language / Visual special needs []  - 0 Assessment of Community assistance (transportation, D/C planning, etc.) []  - 0 Additional assistance / Altered  mentation []  - 0 Support Surface(s) Assessment (bed, cushion, seat, etc.) INTERVENTIONS - Wound Cleansing / Measurement []  - 0 Simple Wound Cleansing - one wound X- 2 5 Complex Wound Cleansing -  multiple wounds X- 1 5 Wound Imaging (photographs - any number of wounds) $RemoveBe'[]'vvUyVEvHJ$  - 0 Wound Tracing (instead of photographs) $RemoveBeforeD'[]'XaZIuspWBAxxMg$  - 0 Simple Wound Measurement - one wound X- 2 5 Complex Wound Measurement - multiple wounds INTERVENTIONS - Wound Dressings X - Small Wound Dressing one or multiple wounds 2 10 $Re'[]'nQe$  - 0 Medium Wound Dressing one or multiple wounds $RemoveBeforeD'[]'bzVPTRqAcouxTm$  - 0 Large Wound Dressing one or multiple wounds $RemoveBeforeD'[]'DkdpYBVZCzYzCp$  - 0 Application of Medications - topical $RemoveB'[]'YGZLyOjk$  - 0 Application of Medications - injection INTERVENTIONS - Miscellaneous $RemoveBeforeD'[]'BKavgUDGarTCKP$  - 0 External ear exam $Remove'[]'EokqHCP$  - 0 Specimen Collection (cultures, biopsies, blood, body fluids, etc.) $RemoveBefor'[]'TefkyWhUMRxi$  - 0 Specimen(s) / Culture(s) sent or taken to Lab for analysis $RemoveBefo'[]'OZEDyuhFwkb$  - 0 Patient Transfer (multiple staff / Civil Service fast streamer / Similar devices) $RemoveBeforeDE'[]'KSMyvqKMDeijyYa$  - 0 Simple Staple / Suture removal (25 or less) $Remove'[]'zgVrrtq$  - 0 Complex Staple / Suture removal (26 or more) $Remove'[]'hQrKGxr$  - 0 Hypo / Hyperglycemic Management (close monitor of Blood Glucose) $RemoveBefore'[]'PsDQSDbRcDfTq$  - 0 Ankle / Brachial Index (ABI) - do not check if billed separately X- 1 5 Vital Signs Has the patient been seen at the hospital within the last three years: Yes Total Score: 105 Level Of Care: New/Established - Level 3 Electronic Signature(s) Signed: 08/15/2021 6:31:34 PM By: Lorrin Jackson Entered By: Lorrin Jackson on 08/15/2021 14:32:13 -------------------------------------------------------------------------------- Encounter Discharge Information Details Patient Name: Date of Service: Luis Pence [facility] [name]. 08/15/2021 1:45 PM Medical Record Number: [id number] Patient Account Number: [id number] Date of Birth/Sex: Treating RN: [birth year] (75 y.o. [name] Primary Care Lorra Freeman: [name] Other Clinician: Referring Rondo Spittler: Treating  Asya Derryberry/Extender: [name] in Treatment: 67 Encounter Discharge Information Items Discharge Condition: Stable Ambulatory Status: Wheelchair Discharge Destination: Home Transportation: Private Auto Accompanied By: wife Schedule Follow-up Appointment: Yes Clinical Summary of Care: Provided on 08/15/2021 Form Type Recipient Paper Patient Patient Electronic Signature(s) Signed: 08/15/2021 6:31:34 PM By: Lorrin Jackson Entered By: Lorrin Jackson on 08/15/2021 14:34:13 -------------------------------------------------------------------------------- Lower Extremity Assessment Details Patient Name: Date of Service: Alycia Patten [name]. 08/15/2021 1:45 PM Medical Record Number: [id number] Patient Account Number: [id number] Date of Birth/Sex: Treating RN: 1946-08-14 (75 y.o. [name] Primary Care Jandy Brackens: [name] Other Clinician: Referring Roger Kettles: Treating Jacelyn Cuen/Extender: [name] in Treatment: 54 Edema Assessment Assessed: Shirlyn Goltz: No] Patrice Paradise: Yes] Edema: [Left: N] [Right: o] Calf Left: Right: Point of Measurement: 27 cm From Medial Instep 25 cm Ankle Left: Right: Point of Measurement: 9 cm From Medial Instep 17 cm Vascular Assessment Pulses: Dorsalis Pedis Palpable: [Right:Yes] Electronic Signature(s) Signed: 08/15/2021 6:31:34 PM By: Lorrin Jackson Entered By: Lorrin Jackson on 08/15/2021 14:13:07 -------------------------------------------------------------------------------- Multi Wound Chart Details Patient Name: Date of Service: Luis Pence HN [name]. 08/15/2021 1:45 PM Medical Record Number: [id number] Patient Account Number: [id number] Date of Birth/Sex: Treating RN: [birth year] (75 y.o. [name], Lauren Primary Care Marjorie Deprey: [name] Other Clinician: Referring Arleigh Dicola: Treating Arisa Congleton/Extender: [name] in Treatment: 41 Vital Signs Height(in):  64 Capillary Blood Glucose(mg/dl): 146 Weight(lbs): 150 Pulse(bpm): 49 Body Mass Index(BMI): 26 Blood Pressure(mmHg): 99/64 Temperature(F): 97.9 Respiratory Rate(breaths/min): 16 Photos: [N/A:N/A] Right [name] Great oe Right [name] Fourth oe N/A Wound Location: Not Known Gradually Appeared N/A Wounding Event: Diabetic Wound/Ulcer of the Lower Diabetic Wound/Ulcer of the Lower N/A Primary Etiology: Extremity Extremity Congestive Heart Failure, Coronary Congestive Heart Failure, Coronary N/A Comorbid History: Artery Disease, Hypertension, Type II Artery Disease, Hypertension, Type II Diabetes, Seizure Disorder Diabetes, Seizure Disorder 10/11/2020 11/22/2020 N/A Date  Acquired: 43 38 N/A Weeks of Treatment: Open Open N/A Wound Status: 0.5x0.6x0.1 1.4x1.4x0.1 N/A Measurements L x W x D (cm) 0.236 1.539 N/A A (cm) : rea 0.024 0.154 N/A Volume (cm) : 78.50% -226.80% N/A % Reduction in A rea: 78.20% -227.70% N/A % Reduction in Volume: Grade 2 Grade 2 N/A Classification: Medium Small N/A Exudate A mount: Serosanguineous Purulent N/A Exudate Type: red, brown yellow, brown, green N/A Exudate Color: Distinct, outline attached Distinct, outline attached N/A Wound Margin: None Present (0%) None Present (0%) N/A Granulation A mount: Large (67-100%) Large (67-100%) N/A Necrotic A mount: Eschar Eschar N/A Necrotic Tissue: Fat Layer (Subcutaneous Tissue): Yes Fat Layer (Subcutaneous Tissue): Yes N/A Exposed Structures: Fascia: No Fascia: No Tendon: No Tendon: No Muscle: No Muscle: No Joint: No Joint: No Bone: No Bone: No Medium (34-66%) None N/A Epithelialization: Treatment Notes Wound #4 (Toe Great) Wound Laterality: Right Cleanser Wound Cleanser Discharge Instruction: Cleanse the wound with wound cleanser prior to applying a clean dressing using gauze sponges, not tissue or cotton balls. Peri-Wound Care Topical Primary Dressing Betadine Discharge Instruction:  apply betadine to wound. Secondary Dressing Woven Gauze Sponges 2x2 in Discharge Instruction: Apply over primary dressing as directed. Secured With Conforming Stretch Gauze Bandage, Sterile 2x75 (in/in) Discharge Instruction: Secure with stretch gauze as directed. Compression Wrap Compression Stockings Add-Ons Wound #6 (Toe Fourth) Wound Laterality: Right Cleanser Wound Cleanser Discharge Instruction: Cleanse the wound with wound cleanser prior to applying a clean dressing using gauze sponges, not tissue or cotton balls. Peri-Wound Care Topical Primary Dressing Betadine Discharge Instruction: apply betadine to wound. Secondary Dressing Woven Gauze Sponges 2x2 in Discharge Instruction: Apply over primary dressing as directed. Secured With Conforming Stretch Gauze Bandage, Sterile 2x75 (in/in) Discharge Instruction: Secure with stretch gauze as directed. Compression Wrap Compression Stockings Add-Ons Electronic Signature(s) Signed: 08/16/2021 9:25:46 AM By: Kalman Shan DO Signed: 08/20/2021 4:45:49 PM By: Rhae Hammock RN Entered By: Kalman Shan on 08/15/2021 16:18:27 -------------------------------------------------------------------------------- Multi-Disciplinary Care Plan Details Patient Name: Date of Service: Luis Pence HN [name]. 08/15/2021 1:45 PM Medical Record Number: [id number] Patient Account Number: [id number] Date of Birth/Sex: Treating RN: 02-Sep-1946 (75 y.o. [name] Primary Care Jasneet Schobert: [name] Other Clinician: Referring Ailsa Mireles: Treating Tryone Kille/Extender: [name] in Treatment: [place] reviewed with physician Active Inactive Wound/Skin Impairment Nursing Diagnoses: Impaired tissue integrity Knowledge deficit related to ulceration/compromised skin integrity Goals: Patient/caregiver will verbalize understanding of skin care regimen Date Initiated: 07/27/2020 Target  Resolution Date: 09/12/2021 Goal Status: Active Ulcer/skin breakdown will have a volume reduction of 30% by week 4 Date Initiated: 07/27/2020 Date Inactivated: 09/13/2020 Target Resolution Date: 08/24/2020 Goal Status: Met Interventions: Assess patient/caregiver ability to obtain necessary supplies Assess patient/caregiver ability to perform ulcer/skin care regimen upon admission and as needed Assess ulceration(s) every visit Provide education on ulcer and skin care Treatment Activities: Skin care regimen initiated : 07/27/2020 Topical wound management initiated : 07/27/2020 Notes: 08/15/21: Wound care regimen continues. Dressings changed by [facility] and family. Electronic Signature(s) Signed: 08/15/2021 2:21:03 PM By: Lorrin Jackson Entered By: Lorrin Jackson on 08/15/2021 14:21:03 -------------------------------------------------------------------------------- Pain Assessment Details Patient Name: Date of Service: Alycia Patten [name]. 08/15/2021 1:45 PM Medical Record Number: [id number] Patient Account Number: [id number] Date of Birth/Sex: Treating RN: December 05, 1945 (75 y.o. [name] Primary Care Mica Releford: [name] Other Clinician: Referring Mackie Goon: Treating Cabrini Ruggieri/Extender: [name] in Treatment: 4 Active Problems Location of Pain Severity and Description of Pain Patient Has Paino No Site  Locations Pain Management and Medication Current Pain Management: Electronic Signature(s) Signed: 08/15/2021 6:31:34 PM By: Lorrin Jackson Entered By: Lorrin Jackson on 08/15/2021 14:08:50 -------------------------------------------------------------------------------- Patient/Caregiver Education Details Patient Name: Date of Service: Julieta Gutting 11/3/2022andnbsp1:45 PM Medical Record Number: [id number] Patient Account Number: [id number] Date of Birth/Gender: Treating RN: 03/08/1946 (75 y.o. [name] Primary Care Physician:  [name] Other Clinician: Referring Physician: Treating Physician/Extender: [name] in Treatment: 63 Education Assessment Education Provided To: Patient Education Topics Provided Offloading: Methods: Explain/Verbal Responses: State content correctly Wound/Skin Impairment: Methods: Explain/Verbal, Printed Responses: State content correctly Electronic Signature(s) Signed: 08/15/2021 6:31:34 PM By: Lorrin Jackson Entered By: Lorrin Jackson on 08/15/2021 14:21:36 -------------------------------------------------------------------------------- Wound Assessment Details Patient Name: Date of Service: Alycia Patten [name]. 08/15/2021 1:45 PM Medical Record Number: [id number] Patient Account Number: [id number] Date of Birth/Sex: Treating RN: 19-Mar-1946 (75 y.o. [name] Primary Care Norelle Runnion: [name] Other Clinician: Referring Nyair Depaulo: Treating Asaiah Hunnicutt/Extender: [name] in Treatment: 31 Wound Status Wound Number: 4 Primary Diabetic Wound/Ulcer of the Lower Extremity Etiology: Wound Location: Right [name] Great oe Wound Open Wounding Event: Not Known Status: Date Acquired: 10/11/2020 Comorbid Congestive Heart Failure, Coronary Artery Disease, Weeks Of Treatment: 43 History: Hypertension, Type II Diabetes, Seizure Disorder Clustered Wound: No Photos Wound Measurements Length: (cm) 0.5 Width: (cm) 0.6 Depth: (cm) 0.1 Area: (cm) 0.236 Volume: (cm) 0.024 % Reduction in Area: 78.5% % Reduction in Volume: 78.2% Epithelialization: Medium (34-66%) Tunneling: No Undermining: No Wound Description Classification: Grade 2 Wound Margin: Distinct, outline attached Exudate Amount: Medium Exudate Type: Serosanguineous Exudate Color: red, brown Foul Odor After Cleansing: No Slough/Fibrino No Wound Bed Granulation Amount: None Present (0%) Exposed Structure Necrotic Amount: Large (67-100%) Fascia  Exposed: No Necrotic Quality: Eschar Fat Layer (Subcutaneous Tissue) Exposed: Yes Tendon Exposed: No Muscle Exposed: No Joint Exposed: No Bone Exposed: No Treatment Notes Wound #4 (Toe Great) Wound Laterality: Right Cleanser Wound Cleanser Discharge Instruction: Cleanse the wound with wound cleanser prior to applying a clean dressing using gauze sponges, not tissue or cotton balls. Peri-Wound Care Topical Primary Dressing Betadine Discharge Instruction: apply betadine to wound. Secondary Dressing Woven Gauze Sponges 2x2 in Discharge Instruction: Apply over primary dressing as directed. Secured With Conforming Stretch Gauze Bandage, Sterile 2x75 (in/in) Discharge Instruction: Secure with stretch gauze as directed. Compression Wrap Compression Stockings Add-Ons Electronic Signature(s) Signed: 08/15/2021 6:31:34 PM By: Lorrin Jackson Entered By: Lorrin Jackson on 08/15/2021 14:14:49 -------------------------------------------------------------------------------- Wound Assessment Details Patient Name: Date of Service: Alycia Patten [name]. 08/15/2021 1:45 PM Medical Record Number: [id number] Patient Account Number: [id number] Date of Birth/Sex: Treating RN: Aug 14, 1946 (75 y.o. [name] Primary Care Pierson Vantol: [name] Other Clinician: Referring Horton Ellithorpe: Treating Jazelyn Sipe/Extender: [name] in Treatment: 64 Wound Status Wound Number: 6 Primary Diabetic Wound/Ulcer of the Lower Extremity Etiology: Wound Location: Right [name] Fourth oe Wound Open Wounding Event: Gradually Appeared Status: Date Acquired: 11/22/2020 Comorbid Congestive Heart Failure, Coronary Artery Disease, Weeks Of Treatment: 38 History: Hypertension, Type II Diabetes, Seizure Disorder Clustered Wound: No Photos Wound Measurements Length: (cm) 1.4 Width: (cm) 1.4 Depth: (cm) 0.1 Area: (cm) 1.539 Volume: (cm) 0.154 % Reduction in Area: -226.8% % Reduction in  Volume: -227.7% Epithelialization: None Tunneling: No Undermining: No Wound Description Classification: Grade 2 Wound Margin: Distinct, outline attached Exudate Amount: Small Exudate Type: Purulent Exudate Color: yellow, brown, green Foul Odor After Cleansing: No Slough/Fibrino No Wound Bed Granulation Amount: None Present (0%) Exposed Structure Necrotic Amount:  Large (67-100%) Fascia Exposed: No Necrotic Quality: Eschar Fat Layer (Subcutaneous Tissue) Exposed: Yes Tendon Exposed: No Muscle Exposed: No Joint Exposed: No Bone Exposed: No Treatment Notes Wound #6 (Toe Fourth) Wound Laterality: Right Cleanser Wound Cleanser Discharge Instruction: Cleanse the wound with wound cleanser prior to applying a clean dressing using gauze sponges, not tissue or cotton balls. Peri-Wound Care Topical Primary Dressing Betadine Discharge Instruction: apply betadine to wound. Secondary Dressing Woven Gauze Sponges 2x2 in Discharge Instruction: Apply over primary dressing as directed. Secured With Conforming Stretch Gauze Bandage, Sterile 2x75 (in/in) Discharge Instruction: Secure with stretch gauze as directed. Compression Wrap Compression Stockings Add-Ons Electronic Signature(s) Signed: 08/15/2021 6:31:34 PM By: Lorrin Jackson Entered By: Lorrin Jackson on 08/15/2021 14:15:32 -------------------------------------------------------------------------------- Vitals Details Patient Name: Date of Service: Luis Pence HN [name]. 08/15/2021 1:45 PM Medical Record Number: [id number] Patient Account Number: [id number] Date of Birth/Sex: Treating RN: [birth year] (75 y.o. [name] Primary Care Zygmunt Mcglinn: [name] Other Clinician: Referring Shakeita Vandevander: Treating Krisann Mckenna/Extender: [name] in Treatment: 1 Vital Signs Time Taken: 14:08 Temperature (F): 97.9 Height (in): 64 Pulse (bpm): 68 Weight (lbs): 150 Respiratory Rate (breaths/min):  16 Body Mass Index (BMI): 25.7 Blood Pressure (mmHg): 99/64 Capillary Blood Glucose (mg/dl): 146 Reference Range: 80 - 120 mg / dl Electronic Signature(s) Signed: 08/15/2021 6:31:34 PM By: Lorrin Jackson Entered By: Lorrin Jackson on 08/15/2021 14:08:43

## 2021-08-20 NOTE — Progress Notes (Signed)
UMBERTO, PAVEK T ([id number]) . Visit Report for 08/15/2021 Chief Complaint Document Details Patient Name: Date of Service: GEARL, BARATTA [facility] T. 08/15/2021 1:45 PM Medical Record Number: [id number] Patient Account Number: [id number] Date of Birth/Sex: Treating RN: 01-Mar-1946 (75 y.o. [name], Lauren Primary Care Provider: [name] Other Clinician: Referring Provider: Treating Provider/Extender: Theodosia Quay in Treatment: 20 Information Obtained from: Patient Chief Complaint Bilateral toe wounds Electronic Signature(s) Signed: 08/16/2021 9:25:46 AM By: Kalman Shan DO Entered By: Kalman Shan on 08/15/2021 16:18:39 -------------------------------------------------------------------------------- HPI Details Patient Name: Date of Service: [name] 08/15/2021 1:45 PM Medical Record Number: [id number] Patient Account Number: [id number] Date of Birth/Sex: Treating RN: Dec 02, 1945 (75 y.o. [name], Lauren Primary Care Provider: [name] Other Clinician: Referring Provider: Treating Provider/Extender: Theodosia Quay in Treatment: 77 History of Present Illness HPI Description: ADMISSION 07/27/2020 This is a 75 year old man who is accompanied by his wife (ex). Nevertheless she is his active caregiver. His most complicated features are remote CVA that left him with a left hemiparesis. He also has widely metastatic prostate cancer to bone which I think is contributed to increasing frailty this year and he is no longer ambulatory. He required hospitalization from 9/7 through 9/14 with respiratory failure seizures. During this time it was noted that he had an open area in the left buttock in close proximity to the gluteal cleft. His wife tells me that he has a history of perirectal abscesses. And she describes this area as starting as a painful swelling in late August certainly sounds like an abscess. When he is  in the hospital he required bedside IandD's but did not go to the OR. Has CT scan of the area showed soft tissue thickening in the perianal ulcer. At that point it was concerning for a fistula connection however a discrete abscess was not seen. Notable that he was hypoalbuminemic in the hospital but actually came out better with an albumin of 3.1 on 9/14 his wife says he is eating well. They are currently treating this with Santyl ointment and a backing wet-to-dry dressing. By description of his wife this is done quite nicely and there is certainly less debris over the wound surface. They already have a hospital bed with a level 3 pressure relief surface. The patient lies on his back when he is in bed especially at night although his wife is counseled him not to do this. They are starting sliding board transfers into his wheelchair I think with physical therapy. He has a Foley catheter in place Past medical history includes remote CVA, metastatic prostate cancer to large areas of his thoracic and lumbar spine and pelvis., Bilateral carotid artery stenosis, Parkinson's disease, type 2 diabetes, diastolic heart failure, CHF, hypertension and a recently discovered possible pancreatic mass in the head of the pancreas 08/16/2020 patient I admitted to the clinic 2-1/2 weeks ago. He had an abscess site on his left buttock that required an IandD while he was in the hospital in September. He has been using Santyl backing wet-to-dry. He is cared for her aerobically at home by his wife 2 concerning areas on his feet. A stage I pressure injury on the right heel this does not have an open wound. He also has on the lateral aspect of the left fourth toe a dry circular eschared wound. 12/2; monthly follow-up. Is a very disabled man who had an abscess in his left buttock and required an IandD. We have been using Santyl to  this area I changed to silver collagen last time he is here and this actually looks quite a bit  better The last time he was here his wife showed me an area on the left lateral fourth toe I think a pressure injury with his fifth toe. Been using Santyl here as well but not making much improvement As well she shows me he has an area on his right buttock which is very tiny pinpoint area but under illumination still obviously an open area. I wonder if this is more of a shear injury and transferring and a pressure injury per se 12/16; this is a very disabled man who has a left buttock area that required an IandD apparently an abscess. Since he has come here that he developed a area on the right buttock. His wife says that this is draining pus. Finally he has an area on the lateral part of the left fourth toe. We have been using silver collagen on the buttock and Iodoflex on the fourth toe 1/6; this is a disabled man we have been working on a left buttock area that was initially an IandD abscess injury. We have got this down to a small area in fact it appears to be epithelializing towards the base of the wound. When he was here last time he had a small draining area on the right buttock. I thought this might be a small cyst culture of the drainage was negative. I do not think this was an abscess at all While he has been here he has been developing areas on the toes on his left foot. He initially had one on the left fourth toe and then last time the right first toe. He is a type II diabetic. With he has a history of coronary artery disease. He complains of a lot of pain in the right foot 1/13; we have been working on areas on his left buttock and then on the right buttock. Both of these are healed today albeit the area on the left has some depth with skin is gone into a divot. Nevertheless I think this is a reasonable outcome. He arrived a week ago with ischemic looking wounds on his right fifth, right first and his left fourth toe. He went on to have arterial studies which we received today these  showed an ABI that was noncompressible on both sides however the great toe on the right had a TBI of 0.14 with a pressure of only 17. Monophasic waveforms on all the tibial vessels and including the popliteal on the left he had monophasic waveforms again noncompressible vessels and no recorded toe pressure. Clearly these are ischemic wounds. Fortunately he is not complaining of a lot of pain The patient has a complicated medical situation which includes widely metastatic prostate cancer to bone for which she is on palliative care he also has Parkinson's disease. I have a recent note from his neurologist at which time he asked whether they wish to de-escalate the Parkinson's regimen and the answer was no. The patient has a follow-up appointment with Dr. Gwenlyn Found on 1/28. I believe he is the patient's cardiologist but this appointment was made because of the vascular studies I think predominantly through Dr. Kennon Holter office. 2/10; patient went back to see Dr. Gwenlyn Found. He did not feel given his comorbidities that he was a candidate for endovascular therapy. His wife seems to agree with that. He has ischemic wounds on his right toes particularly the right fifth toe fourth toe  and first toe. He is not in a lot of pain unless these are manipulated 12/20/2020 on evaluation today patient appears to be doing well with regard to his feet all things considered. There does not appear to be any obvious signs of infection here. Most of the wounds are actually for the most part eschar and dry. With that being said the patient is not actually a candidate for revascularization according to Dr. Gwenlyn Found who he has seen previous. Nonetheless I think that for the time being Betadine or something of that sort just keep things clean and dry will be a good way to go. 3/24; patient has 3 areas on the dorsal right toes first fourth and fifth that are necrotic probably ischemic. He was not felt to be a candidate for revascularization  therapy. He also has an area on the left fourth toe in a similar situation. His significant other is concerned about possibility of an abscess on his buttock. He was put on antibiotics last time. I must say that when he was here before he seemed to have recurrent small abscesses but the purulence in this culture is negative I wondered about an epidermoid cyst although this area currently is not in the same spot 4/21; patient presents for his 1 month follow-up. He has chronic necrotic right toes. The affected toes include the first fourth and fifth digits. The third toe wound has closed. He uses Betadine on this daily. He has no complaints today. He denies fever chills, erythema to the wound sites. 5/19; patient presents for 1 month follow-up. He has had no issues in the past month with his bilateral toe wounds due to arterial insufficiency. He continues to use Betadine on these daily. He has no complaints today. He denies signs of infection. 6/30; patient presents for 6-week follow-up. He denies any issues. He has been using Betadine to the areas of necrosis on the right toes. He denies signs of infection. 8/25; patient presents for 2-month follow-up. He states that 1 week ago he developed some thick drainage from the fourth toe near the toenail. Patient has been putting antibiotic ointment to this area. He denies increased warmth or erythema to the fourth toe. He denies systemic signs of infection. He continues to use Betadine to the other necrotic areas. 9/1; patient states he has taken Bactrim for the past week. He noticed yellow drainage until 2 days ago when it stopped. He denies increased warmth or erythema to the area. He denies systemic signs of infection. 9/8; patient presents for follow-up. He has completed his second round of Bactrim. He has not noticed any more drainage. He overall feels well. He has no issues or complaints today. He denies signs of infection. 11/3; patient presents for  follow-up. He has no issues or complaints today. He states that over the past 2 months he has had no issues with the toe wounds. He continues to use Betadine to them. He has not identified any signs of infection. He has no pain. Electronic Signature(s) Signed: 08/16/2021 9:25:46 AM By: Kalman Shan DO Entered By: Kalman Shan on 08/15/2021 16:19:16 -------------------------------------------------------------------------------- Physical Exam Details Patient Name: Date of Service: [name] 08/15/2021 1:45 PM Medical Record Number: [id number] Patient Account Number: [id number] Date of Birth/Sex: Treating RN: 29-Aug-1946 (75 y.o. Erie Noe Primary Care Provider: [name] Other Clinician: Referring Provider: Treating Provider/Extender: Theodosia Quay in Treatment: 64 Constitutional respirations regular, non-labored and within target range for patient.Marland Kitchen Psychiatric pleasant and cooperative.  Notes Right foot: He has an eschar to the dorsal first and fourth toes. No increased warmth or erythema to the fourth toe. Foot and leg are warm. Electronic Signature(s) Signed: 08/16/2021 9:25:46 AM By: Kalman Shan DO Entered By: Kalman Shan on 08/16/2021 08:52:37 -------------------------------------------------------------------------------- Physician Orders Details Patient Name: Date of Service: [name] 08/15/2021 1:45 PM Medical Record Number: [id number] Patient Account Number: [id number] Date of Birth/Sex: Treating RN: 11/27/1945 (75 y.o. [name] Primary Care Provider: [name] Other Clinician: Referring Provider: Treating Provider/Extender: Theodosia Quay in Treatment: 78 Verbal / Phone Orders: No Diagnosis Coding ICD-10 Coding Code Description [id number] Non-pressure chronic ulcer of other part of left foot with other specified severity L97.514 Non-pressure chronic ulcer of  other part of right foot with necrosis of bone E11.51 Type 2 diabetes mellitus with diabetic peripheral angiopathy without gangrene I73.9 Peripheral vascular disease, unspecified Follow-up Appointments ppointment in: - 2 months w/ Dr. [place] Return A Bathing/ Shower/ Hygiene May shower with protection but do not get wound dressing(s) wet. Edema Control - Lymphedema / SCD / Other Moisturize legs daily. Off-Loading Low air-loss mattress (Group 2) Turn and reposition every 2 hours Other: - CONTINUE TO USE BUNNY BOOTS FOR HEEL PROTECTION. UP IN WHEELCHAIR NO MORE THAT 2 HOUR INCREMENTS. Ensure to relieve pressure off buttock closed areas to prevent reopening. Home Health No change in wound care orders this week; continue Home Health for wound care. May utilize formulary equivalent dressing for wound treatment orders unless otherwise specified. Other Home Health Orders/Instructions: - [facility] Wound Treatment Wound #4 - T Great oe Wound Laterality: Right Cleanser: Wound Cleanser (Home Health) 1 x Per Day/30 Days Discharge Instructions: Cleanse the wound with wound cleanser prior to applying a clean dressing using gauze sponges, not tissue or cotton balls. Prim Dressing: Betadine 1 x Per Day/30 Days ary Discharge Instructions: apply betadine to wound. Secondary Dressing: Woven Gauze Sponges 2x2 in (Home Health) 1 x Per Day/30 Days Discharge Instructions: Apply over primary dressing as directed. Secured With: Child psychotherapist, Sterile 2x75 (in/in) (Home Health) 1 x Per Day/30 Days Discharge Instructions: Secure with stretch gauze as directed. Wound #6 - T Fourth oe Wound Laterality: Right Cleanser: Wound Cleanser (Home Health) 1 x Per Day/30 Days Discharge Instructions: Cleanse the wound with wound cleanser prior to applying a clean dressing using gauze sponges, not tissue or cotton balls. Prim Dressing: Betadine 1 x Per Day/30 Days ary Discharge Instructions: apply  betadine to wound. Secondary Dressing: Woven Gauze Sponges 2x2 in (Home Health) 1 x Per Day/30 Days Discharge Instructions: Apply over primary dressing as directed. Secured With: Child psychotherapist, Sterile 2x75 (in/in) (Home Health) 1 x Per Day/30 Days Discharge Instructions: Secure with stretch gauze as directed. Electronic Signature(s) Signed: 08/16/2021 9:25:46 AM By: Kalman Shan DO Previous Signature: 08/15/2021 6:31:34 PM Version By: Lorrin Jackson Entered By: Kalman Shan on 08/16/2021 08:52:53 -------------------------------------------------------------------------------- Problem List Details Patient Name: Date of Service: [name] 08/15/2021 1:45 PM Medical Record Number: [id number] Patient Account Number: [id number] Date of Birth/Sex: Treating RN: 04/03/1946 (75 y.o. [name] Primary Care Provider: [name] Other Clinician: Referring Provider: Treating Provider/Extender: Theodosia Quay in Treatment: 33 Active Problems ICD-10 Encounter Code Description Active Date MDM Diagnosis L97.528 Non-pressure chronic ulcer of other part of left foot with other specified 08/16/2020 No Yes severity L97.514 Non-pressure chronic ulcer of other part of right foot with necrosis of bone 10/18/2020 No Yes E11.51  Type 2 diabetes mellitus with diabetic peripheral angiopathy without gangrene 10/18/2020 No Yes I73.9 Peripheral vascular disease, unspecified 06/06/2021 No Yes Inactive Problems ICD-10 Code Description Active Date Inactive Date L89.611 Pressure ulcer of right heel, stage 1 08/16/2020 08/16/2020 L89.312 Pressure ulcer of right buttock, stage 2 09/13/2020 09/13/2020 Resolved Problems ICD-10 Code Description Active Date Resolved Date L02.31 Cutaneous abscess of buttock 07/27/2020 07/27/2020 S31.829D Unspecified open wound of left buttock, subsequent encounter 07/27/2020 07/27/2020 Electronic Signature(s) Signed:  08/16/2021 9:25:46 AM By: Kalman Shan DO Entered By: Kalman Shan on 08/15/2021 16:14:46 -------------------------------------------------------------------------------- Progress Note Details Patient Name: Date of Service: [name] 08/15/2021 1:45 PM Medical Record Number: [id number] Patient Account Number: [id number] Date of Birth/Sex: Treating RN: 02-Nov-1945 (75 y.o. [name], Lauren Primary Care Provider: [name] Other Clinician: Referring Provider: Treating Provider/Extender: Theodosia Quay in Treatment: 49 Subjective Chief Complaint Information obtained from Patient Bilateral toe wounds History of Present Illness (HPI) ADMISSION 07/27/2020 This is a 75 year old man who is accompanied by his wife (ex). Nevertheless she is his active caregiver. His most complicated features are remote CVA that left him with a left hemiparesis. He also has widely metastatic prostate cancer to bone which I think is contributed to increasing frailty this year and he is no longer ambulatory. He required hospitalization from 9/7 through 9/14 with respiratory failure seizures. During this time it was noted that he had an open area in the left buttock in close proximity to the gluteal cleft. His wife tells me that he has a history of perirectal abscesses. And she describes this area as starting as a painful swelling in late August certainly sounds like an abscess. When he is in the hospital he required bedside IandD's but did not go to the OR. Has CT scan of the area showed soft tissue thickening in the perianal ulcer. At that point it was concerning for a fistula connection however a discrete abscess was not seen. Notable that he was hypoalbuminemic in the hospital but actually came out better with an albumin of 3.1 on 9/14 his wife says he is eating well. They are currently treating this with Santyl ointment and a backing wet-to-dry dressing. By description  of his wife this is done quite nicely and there is certainly less debris over the wound surface. They already have a hospital bed with a level 3 pressure relief surface. The patient lies on his back when he is in bed especially at night although his wife is counseled him not to do this. They are starting sliding board transfers into his wheelchair I think with physical therapy. He has a Foley catheter in place Past medical history includes remote CVA, metastatic prostate cancer to large areas of his thoracic and lumbar spine and pelvis., Bilateral carotid artery stenosis, Parkinson's disease, type 2 diabetes, diastolic heart failure, CHF, hypertension and a recently discovered possible pancreatic mass in the head of the pancreas 08/16/2020 patient I admitted to the clinic 2-1/2 weeks ago. He had an abscess site on his left buttock that required an IandD while he was in the hospital in September. He has been using Santyl backing wet-to-dry. He is cared for her aerobically at home by his wife 2 concerning areas on his feet. A stage I pressure injury on the right heel this does not have an open wound. He also has on the lateral aspect of the left fourth toe a dry circular eschared wound. 12/2; monthly follow-up. Is a very disabled man who had  an abscess in his left buttock and required an IandD. We have been using Santyl to this area I changed to silver collagen last time he is here and this actually looks quite a bit better ooThe last time he was here his wife showed me an area on the left lateral fourth toe I think a pressure injury with his fifth toe. Been using Santyl here as well but not making much improvement ooAs well she shows me he has an area on his right buttock which is very tiny pinpoint area but under illumination still obviously an open area. I wonder if this is more of a shear injury and transferring and a pressure injury per se 12/16; this is a very disabled man who has a left  buttock area that required an IandD apparently an abscess. Since he has come here that he developed a area on the right buttock. His wife says that this is draining pus. Finally he has an area on the lateral part of the left fourth toe. We have been using silver collagen on the buttock and Iodoflex on the fourth toe 1/6; this is a disabled man we have been working on a left buttock area that was initially an IandD abscess injury. We have got this down to a small area in fact it appears to be epithelializing towards the base of the wound. When he was here last time he had a small draining area on the right buttock. I thought this might be a small cyst culture of the drainage was negative. I do not think this was an abscess at all While he has been here he has been developing areas on the toes on his left foot. He initially had one on the left fourth toe and then last time the right first toe. He is a type II diabetic. With he has a history of coronary artery disease. He complains of a lot of pain in the right foot 1/13; we have been working on areas on his left buttock and then on the right buttock. Both of these are healed today albeit the area on the left has some depth with skin is gone into a divot. Nevertheless I think this is a reasonable outcome. He arrived a week ago with ischemic looking wounds on his right fifth, right first and his left fourth toe. He went on to have arterial studies which we received today these showed an ABI that was noncompressible on both sides however the great toe on the right had a TBI of 0.14 with a pressure of only 17. Monophasic waveforms on all the tibial vessels and including the popliteal on the left he had monophasic waveforms again noncompressible vessels and no recorded toe pressure. Clearly these are ischemic wounds. Fortunately he is not complaining of a lot of pain The patient has a complicated medical situation which includes widely metastatic prostate  cancer to bone for which she is on palliative care he also has Parkinson's disease. I have a recent note from his neurologist at which time he asked whether they wish to de-escalate the Parkinson's regimen and the answer was no. The patient has a follow-up appointment with Dr. Gwenlyn Found on 1/28. I believe he is the patient's cardiologist but this appointment was made because of the vascular studies I think predominantly through Dr. Kennon Holter office. 2/10; patient went back to see Dr. Gwenlyn Found. He did not feel given his comorbidities that he was a candidate for endovascular therapy. His wife seems to agree with  that. He has ischemic wounds on his right toes particularly the right fifth toe fourth toe and first toe. He is not in a lot of pain unless these are manipulated 12/20/2020 on evaluation today patient appears to be doing well with regard to his feet all things considered. There does not appear to be any obvious signs of infection here. Most of the wounds are actually for the most part eschar and dry. With that being said the patient is not actually a candidate for revascularization according to Dr. Gwenlyn Found who he has seen previous. Nonetheless I think that for the time being Betadine or something of that sort just keep things clean and dry will be a good way to go. 3/24; patient has 3 areas on the dorsal right toes first fourth and fifth that are necrotic probably ischemic. He was not felt to be a candidate for revascularization therapy. He also has an area on the left fourth toe in a similar situation. His significant other is concerned about possibility of an abscess on his buttock. He was put on antibiotics last time. I must say that when he was here before he seemed to have recurrent small abscesses but the purulence in this culture is negative I wondered about an epidermoid cyst although this area currently is not in the same spot 4/21; patient presents for his 1 month follow-up. He has chronic  necrotic right toes. The affected toes include the first fourth and fifth digits. The third toe wound has closed. He uses Betadine on this daily. He has no complaints today. He denies fever chills, erythema to the wound sites. 5/19; patient presents for 1 month follow-up. He has had no issues in the past month with his bilateral toe wounds due to arterial insufficiency. He continues to use Betadine on these daily. He has no complaints today. He denies signs of infection. 6/30; patient presents for 6-week follow-up. He denies any issues. He has been using Betadine to the areas of necrosis on the right toes. He denies signs of infection. 8/25; patient presents for 31-month follow-up. He states that 1 week ago he developed some thick drainage from the fourth toe near the toenail. Patient has been putting antibiotic ointment to this area. He denies increased warmth or erythema to the fourth toe. He denies systemic signs of infection. He continues to use Betadine to the other necrotic areas. 9/1; patient states he has taken Bactrim for the past week. He noticed yellow drainage until 2 days ago when it stopped. He denies increased warmth or erythema to the area. He denies systemic signs of infection. 9/8; patient presents for follow-up. He has completed his second round of Bactrim. He has not noticed any more drainage. He overall feels well. He has no issues or complaints today. He denies signs of infection. 11/3; patient presents for follow-up. He has no issues or complaints today. He states that over the past 2 months he has had no issues with the toe wounds. He continues to use Betadine to them. He has not identified any signs of infection. He has no pain. Patient History Information obtained from Patient. Family History Unknown History. Social History Former smoker - quit 12 years ago, Marital Status - Divorced, Alcohol Use - Never, Drug Use - No History, Caffeine Use - Rarely. Medical  History Cardiovascular Patient has history of Congestive Heart Failure, Coronary Artery Disease, Hypertension Endocrine Patient has history of Type II Diabetes Denies history of Type I Diabetes Neurologic Patient has history of  Seizure Disorder Oncologic Denies history of Received Chemotherapy, Received Radiation Medical A Surgical History Notes nd Cardiovascular CVA, Carotid stenosis Genitourinary CKD stage 3 Neurologic Parkinson's, left hemiparesis, CVA Oncologic prostate cancer with mets to bone, bladder cancer Objective Constitutional respirations regular, non-labored and within target range for patient.. Vitals Time Taken: 2:08 PM, Height: 64 in, Weight: 150 lbs, BMI: 25.7, Temperature: 97.9 F, Pulse: 68 bpm, Respiratory Rate: 16 breaths/min, Blood Pressure: 99/64 mmHg, Capillary Blood Glucose: 146 mg/dl. Psychiatric pleasant and cooperative. General Notes: Right foot: He has an eschar to the dorsal first and fourth toes. No increased warmth or erythema to the fourth toe. Foot and leg are warm. Integumentary (Hair, Skin) Wound #4 status is Open. Original cause of wound was Not Known. The date acquired was: 10/11/2020. The wound has been in treatment 43 weeks. The wound is located on the Right T Great. The wound measures 0.5cm length x 0.6cm width x 0.1cm depth; 0.236cm^2 area and 0.024cm^3 volume. There is Fat Layer oe (Subcutaneous Tissue) exposed. There is no tunneling or undermining noted. There is a medium amount of serosanguineous drainage noted. The wound margin is distinct with the outline attached to the wound base. There is no granulation within the wound bed. There is a large (67-100%) amount of necrotic tissue within the wound bed including Eschar. Wound #6 status is Open. Original cause of wound was Gradually Appeared. The date acquired was: 11/22/2020. The wound has been in treatment 38 weeks. The wound is located on the Right T Fourth. The wound measures 1.4cm  length x 1.4cm width x 0.1cm depth; 1.539cm^2 area and 0.154cm^3 volume. There oe is Fat Layer (Subcutaneous Tissue) exposed. There is no tunneling or undermining noted. There is a small amount of purulent drainage noted. The wound margin is distinct with the outline attached to the wound base. There is no granulation within the wound bed. There is a large (67-100%) amount of necrotic tissue within the wound bed including Eschar. Assessment Active Problems ICD-10 Non-pressure chronic ulcer of other part of left foot with other specified severity Non-pressure chronic ulcer of other part of right foot with necrosis of bone Type 2 diabetes mellitus with diabetic peripheral angiopathy without gangrene Peripheral vascular disease, unspecified The eschared regions on his toes are stable. No signs of infection. Recommended continuing Betadine daily. He knows to call with any questions or concerns. And would like to follow-up in 2 months and I think this is reasonable. This is a palliative case due to severe peripheral arterial disease with no further intervention. Plan Follow-up Appointments: Return Appointment in: - 2 months w/ Dr. Heber [place] Bathing/ Shower/ Hygiene: May shower with protection but do not get wound dressing(s) wet. Edema Control - Lymphedema / SCD / Other: Moisturize legs daily. Off-Loading: Low air-loss mattress (Group 2) Turn and reposition every 2 hours Other: - CONTINUE TO USE BUNNY BOOTS FOR HEEL PROTECTION. UP IN WHEELCHAIR NO MORE THAT 2 HOUR INCREMENTS. Ensure to relieve pressure off buttock closed areas to prevent reopening. Home Health: No change in wound care orders this week; continue Home Health for wound care. May utilize formulary equivalent dressing for wound treatment orders unless otherwise specified. Other Home Health Orders/Instructions: - [facility] WOUND #4: - T Great Wound Laterality: Right oe Cleanser: Wound Cleanser (Home Health) 1 x Per Day/30  Days Discharge Instructions: Cleanse the wound with wound cleanser prior to applying a clean dressing using gauze sponges, not tissue or cotton balls. Prim Dressing: Betadine 1 x Per Day/30 Days ary  Discharge Instructions: apply betadine to wound. Secondary Dressing: Woven Gauze Sponges 2x2 in (Home Health) 1 x Per Day/30 Days Discharge Instructions: Apply over primary dressing as directed. Secured With: Child psychotherapist, Sterile 2x75 (in/in) (Home Health) 1 x Per Day/30 Days Discharge Instructions: Secure with stretch gauze as directed. WOUND #6: - T Fourth Wound Laterality: Right oe Cleanser: Wound Cleanser (Home Health) 1 x Per Day/30 Days Discharge Instructions: Cleanse the wound with wound cleanser prior to applying a clean dressing using gauze sponges, not tissue or cotton balls. Prim Dressing: Betadine 1 x Per Day/30 Days ary Discharge Instructions: apply betadine to wound. Secondary Dressing: Woven Gauze Sponges 2x2 in (Home Health) 1 x Per Day/30 Days Discharge Instructions: Apply over primary dressing as directed. Secured With: Child psychotherapist, Sterile 2x75 (in/in) (Home Health) 1 x Per Day/30 Days Discharge Instructions: Secure with stretch gauze as directed. 1. Betadine daily 2. Follow-up in 2 months Electronic Signature(s) Signed: 08/16/2021 9:25:46 AM By: Kalman Shan DO Entered By: Kalman Shan on 08/16/2021 09:25:19 -------------------------------------------------------------------------------- HxROS Details Patient Name: Date of Service: [name] 08/15/2021 1:45 PM Medical Record Number: [id number] Patient Account Number: [id number] Date of Birth/Sex: Treating RN: April 18, 1946 (75 y.o. [name], Lauren Primary Care Provider: [name] Other Clinician: Referring Provider: Treating Provider/Extender: Theodosia Quay in Treatment: 26 Information Obtained  From Patient Cardiovascular Medical History: Positive for: Congestive Heart Failure; Coronary Artery Disease; Hypertension Past Medical History Notes: CVA, Carotid stenosis Endocrine Medical History: Positive for: Type II Diabetes Negative for: Type I Diabetes Time with diabetes: 5 years ago Treated with: Insulin Blood sugar tested every day: Yes Tested : once a day Genitourinary Medical History: Past Medical History Notes: CKD stage 3 Neurologic Medical History: Positive for: Seizure Disorder Past Medical History Notes: Parkinson's, left hemiparesis, CVA Oncologic Medical History: Negative for: Received Chemotherapy; Received Radiation Past Medical History Notes: prostate cancer with mets to bone, bladder cancer Immunizations Pneumococcal Vaccine: Received Pneumococcal Vaccination: Yes Received Pneumococcal Vaccination On or After 60th Birthday: No Implantable Devices None Family and Social History Unknown History: Yes; Former smoker - quit 12 years ago; Marital Status - Divorced; Alcohol Use: Never; Drug Use: No History; Caffeine Use: Rarely; Financial Concerns: No; Food, Clothing or Shelter Needs: No; Support System Lacking: No; Transportation Concerns: No Electronic Signature(s) Signed: 08/16/2021 9:25:46 AM By: Kalman Shan DO Signed: 08/20/2021 4:45:49 PM By: Rhae Hammock RN Entered By: Kalman Shan on 08/15/2021 16:19:24 -------------------------------------------------------------------------------- SuperBill Details Patient Name: Date of Service: [name] 08/15/2021 Medical Record Number: [id number] Patient Account Number: [id number] Date of Birth/Sex: Treating RN: 03/09/1946 (75 y.o. [name] Primary Care Provider: [name] Other Clinician: Referring Provider: Treating Provider/Extender: Theodosia Quay in Treatment: 54 Diagnosis Coding ICD-10 Codes Code Description [phone] Non-pressure  chronic ulcer of other part of left foot with other specified severity L97.514 Non-pressure chronic ulcer of other part of right foot with necrosis of bone E11.51 Type 2 diabetes mellitus with diabetic peripheral angiopathy without gangrene I73.9 Peripheral vascular disease, unspecified Facility Procedures CPT4 Code: [id number] Description: 99213 - WOUND CARE VISIT-LEV 3 EST PT Modifier: Quantity: 1 Physician Procedures : CPT4 Code Description Modifier [id number] - WC PHYS LEVEL 3 - EST PT ICD-10 Diagnosis Description L97.528 Non-pressure chronic ulcer of other part of left foot with other specified severity E11.51 Type 2 diabetes mellitus with diabetic peripheral  angiopathy without gangrene I73.9 Peripheral vascular disease, unspecified Quantity: 1 Electronic Signature(s) Signed: 08/16/2021 9:25:46  AM By: Kalman Shan DO Previous Signature: 08/15/2021 6:31:34 PM Version By: Lorrin Jackson Entered By: Kalman Shan on 08/16/2021 09:25:26

## 2021-08-21 DIAGNOSIS — Z5111 Encounter for antineoplastic chemotherapy: Secondary | ICD-10-CM | POA: Diagnosis not present

## 2021-08-22 DIAGNOSIS — Z466 Encounter for fitting and adjustment of urinary device: Secondary | ICD-10-CM | POA: Diagnosis not present

## 2021-08-22 DIAGNOSIS — I5032 Chronic diastolic (congestive) heart failure: Secondary | ICD-10-CM | POA: Diagnosis not present

## 2021-08-22 DIAGNOSIS — E1122 Type 2 diabetes mellitus with diabetic chronic kidney disease: Secondary | ICD-10-CM | POA: Diagnosis not present

## 2021-08-22 DIAGNOSIS — S90411D Abrasion, right great toe, subsequent encounter: Secondary | ICD-10-CM | POA: Diagnosis not present

## 2021-08-22 DIAGNOSIS — I69354 Hemiplegia and hemiparesis following cerebral infarction affecting left non-dominant side: Secondary | ICD-10-CM | POA: Diagnosis not present

## 2021-08-22 DIAGNOSIS — I130 Hypertensive heart and chronic kidney disease with heart failure and stage 1 through stage 4 chronic kidney disease, or unspecified chronic kidney disease: Secondary | ICD-10-CM | POA: Diagnosis not present

## 2021-08-22 DIAGNOSIS — L89892 Pressure ulcer of other site, stage 2: Secondary | ICD-10-CM | POA: Diagnosis not present

## 2021-08-22 DIAGNOSIS — G20 Parkinson's disease: Secondary | ICD-10-CM | POA: Diagnosis not present

## 2021-08-22 DIAGNOSIS — Z794 Long term (current) use of insulin: Secondary | ICD-10-CM | POA: Diagnosis not present

## 2021-08-22 DIAGNOSIS — N1832 Chronic kidney disease, stage 3b: Secondary | ICD-10-CM | POA: Diagnosis not present

## 2021-08-23 ENCOUNTER — Telehealth: Payer: Self-pay | Admitting: Cardiovascular Disease

## 2021-08-23 DIAGNOSIS — R339 Retention of urine, unspecified: Secondary | ICD-10-CM | POA: Diagnosis not present

## 2021-08-23 DIAGNOSIS — R1310 Dysphagia, unspecified: Secondary | ICD-10-CM | POA: Diagnosis not present

## 2021-08-23 DIAGNOSIS — G20 Parkinson's disease: Secondary | ICD-10-CM | POA: Diagnosis not present

## 2021-08-23 DIAGNOSIS — I69354 Hemiplegia and hemiparesis following cerebral infarction affecting left non-dominant side: Secondary | ICD-10-CM | POA: Diagnosis not present

## 2021-08-23 DIAGNOSIS — Z466 Encounter for fitting and adjustment of urinary device: Secondary | ICD-10-CM | POA: Diagnosis not present

## 2021-08-23 DIAGNOSIS — N1832 Chronic kidney disease, stage 3b: Secondary | ICD-10-CM | POA: Diagnosis not present

## 2021-08-23 DIAGNOSIS — S90411D Abrasion, right great toe, subsequent encounter: Secondary | ICD-10-CM | POA: Diagnosis not present

## 2021-08-23 DIAGNOSIS — I5032 Chronic diastolic (congestive) heart failure: Secondary | ICD-10-CM | POA: Diagnosis not present

## 2021-08-23 DIAGNOSIS — I130 Hypertensive heart and chronic kidney disease with heart failure and stage 1 through stage 4 chronic kidney disease, or unspecified chronic kidney disease: Secondary | ICD-10-CM | POA: Diagnosis not present

## 2021-08-23 DIAGNOSIS — L89892 Pressure ulcer of other site, stage 2: Secondary | ICD-10-CM | POA: Diagnosis not present

## 2021-08-23 DIAGNOSIS — E1122 Type 2 diabetes mellitus with diabetic chronic kidney disease: Secondary | ICD-10-CM | POA: Diagnosis not present

## 2021-08-23 DIAGNOSIS — Z794 Long term (current) use of insulin: Secondary | ICD-10-CM | POA: Diagnosis not present

## 2021-08-23 NOTE — Telephone Encounter (Signed)
Attempted to return call to heather at [facility]. Will forward message to PharmD for review of meds and any interactions between [place] and Crestor.

## 2021-08-23 NOTE — Telephone Encounter (Signed)
Returned call to [employer] at [facility]- made her aware of PharmD's recommendations. She states she will make them aware and monitor the patient for any symptoms and will call back with any issues.

## 2021-08-23 NOTE — Telephone Encounter (Signed)
Can increase concentration of rosuvastatin which can increase risk of muscle pain. If patient reports muscle pain, recommend discontinuing rosuvastatin.

## 2021-08-23 NOTE — Telephone Encounter (Signed)
  Pt c/o medication issue:  1. Name of Medication: nubeqa  2. How are you currently taking this medication (dosage and times per day)?   3. Are you having a reaction (difficulty breathing--STAT)?   4. What is your medication issue? Heather [facility] called, she said, the pt's new prescription for nubeqa is showing a drug interaction with crestor, she wanted to verify meds

## 2021-08-27 DIAGNOSIS — Z466 Encounter for fitting and adjustment of urinary device: Secondary | ICD-10-CM | POA: Diagnosis not present

## 2021-08-27 DIAGNOSIS — G20 Parkinson's disease: Secondary | ICD-10-CM | POA: Diagnosis not present

## 2021-08-27 DIAGNOSIS — I5032 Chronic diastolic (congestive) heart failure: Secondary | ICD-10-CM | POA: Diagnosis not present

## 2021-08-27 DIAGNOSIS — S90411D Abrasion, right great toe, subsequent encounter: Secondary | ICD-10-CM | POA: Diagnosis not present

## 2021-08-27 DIAGNOSIS — I69354 Hemiplegia and hemiparesis following cerebral infarction affecting left non-dominant side: Secondary | ICD-10-CM | POA: Diagnosis not present

## 2021-08-27 DIAGNOSIS — Z794 Long term (current) use of insulin: Secondary | ICD-10-CM | POA: Diagnosis not present

## 2021-08-27 DIAGNOSIS — E1122 Type 2 diabetes mellitus with diabetic chronic kidney disease: Secondary | ICD-10-CM | POA: Diagnosis not present

## 2021-08-27 DIAGNOSIS — N1832 Chronic kidney disease, stage 3b: Secondary | ICD-10-CM | POA: Diagnosis not present

## 2021-08-27 DIAGNOSIS — L89892 Pressure ulcer of other site, stage 2: Secondary | ICD-10-CM | POA: Diagnosis not present

## 2021-08-27 DIAGNOSIS — I130 Hypertensive heart and chronic kidney disease with heart failure and stage 1 through stage 4 chronic kidney disease, or unspecified chronic kidney disease: Secondary | ICD-10-CM | POA: Diagnosis not present

## 2021-08-28 ENCOUNTER — Other Ambulatory Visit: Payer: Self-pay

## 2021-08-28 ENCOUNTER — Other Ambulatory Visit: Payer: Medicare Other

## 2021-08-28 DIAGNOSIS — Z515 Encounter for palliative care: Secondary | ICD-10-CM

## 2021-08-29 ENCOUNTER — Telehealth: Payer: Self-pay

## 2021-08-29 NOTE — Telephone Encounter (Signed)
350 pm.  Request from Maxwell Caul, RN to update wife that Dr. Tresa Moore will advise if they should return to Dr. Alen Blew.  No answer and message has been left requesting a callback.  Response pending.

## 2021-08-30 NOTE — Telephone Encounter (Signed)
Spoke with [employer] regarding concerns about taking Crestor with nubeqa; that patient is experiencing vomiting. Earlene Plater that I consulted with PharmD Gerald Stabs. He said patient can be off Crestor for a week to see if the reaction/vomiting stops. [name] stated she will let us know next week how the patient responds by withholding the Crestor.

## 2021-08-30 NOTE — Telephone Encounter (Signed)
Pt c/o medication issue:  1. Name of Medication: NUBEQA 300 MG rosuvastatin (CRESTOR) 20 MG tablet  2. How are you currently taking this medication (dosage and times per day)? NUBEQA 1 tablet twice daily, morning and night. Crestor once daily 4 hrs after NUBEQA.  3. Are you having a reaction (difficulty breathing--STAT)? Yes   4. What is your medication issue? [name] has been vomiting 4 hrs after taking medication. Wants to know if Crestor should be decreased due to this.

## 2021-08-30 NOTE — Progress Notes (Signed)
PATIENT NAME: [name] DOB: 1946-08-02 MRN: [id number]  PRIMARY CARE PROVIDER: [name], MD  RESPONSIBLE PARTY:  Acct ID - Guarantor Home Phone Work Phone Relationship Acct Type  [id number] [name], [phone]  Self P/F     [place], [place], [place] [address]   PLAN OF CARE and INTERVENTION:   ADVANCE CARE PLANNING/GOALS OF CARE: Safety, DNR, comfort    DISEASE STATUS: Palliative care visit completed today. [facility] bed in main living room. Awake and aware, verbally interactive. Patient able to deny any pain or shortness of breath. Spoke with caregiver, [name], who provided update on patient. Inquired if patient needed to be followed by an oncologist at this time. Patient is currently receiving treatment for metastatic prostate cancer through Dr. Tresa Moore, his urologist. [name] shared that patient has had some vomiting but with out prior reports of nausea. She is unsure if this is related to chemotherapeutic drug or antibiotic that patient is currently on for infected toe. [name] notified PCP. Patient is followed by wound care clinic for wound to his right foot, toe wound. Plan to contact [facility] to inquire if patient needs to return to Dr. Alen Blew, who patient saw when he was hospitalized and again one time in hospital.   HISTORY OF PRESENT ILLNESS: This is a 75 year old male with PMH including but not limited to metastatic disease, Parkinson's Disease, seizure disorder, essential tremor, bronchiectasis, asthma, dysphagia, bladder cancer and kidney disease stage 3.Palliative care has been asked to follow for additional support, goals of care and complex decision making.  CODE STATUS:  DNR, MOST  PPS: 30%    PHYSICAL EXAM:    Skin: Warm, Dry, wound to toe on right foot.     Lungs: Clear, occasional cough.    Cardiac: Cor Pulmonale: RRR Abdomen: BS positive. Neurological: Alert and Oriented x 3,  positive for weakness, Left side hemiplegia         Maxwell Caul RN, BSN         Cornelius Moras, RN

## 2021-09-02 NOTE — Progress Notes (Signed)
COMMUNITY PALLIATIVE CARE SW NOTE  PATIENT NAME: [name] DOB: May 14, 1946 MRN: [id number]  PRIMARY CARE PROVIDER: [name], MD  RESPONSIBLE PARTY:  Acct ID - Guarantor Home Phone Work Phone Relationship Acct Type  [id number] [name], [name]  Self P/F     [address], [place], [place] [address]     PLAN OF CARE and INTERVENTIONS:             GOALS OF CARE/ ADVANCE CARE PLANNING:  Goal is for patient to remain in his home. Patient is a full code.  SOCIAL/EMOTIONAL/SPIRITUAL ASSESSMENT/ INTERVENTIONS:  SW and RN-B. Senor completed a visit with patient at his home. He was present with his ex-wife/PCG- [name]. Patient was in his hospital bed, awake and alert. He denied pain or shortness of breath. Patient is currently receiving treatment for metastatic prostate cancer through is urologist, however his PCG questions if patient should be seeing a oncologist-PGG to follow-up with his PCP. Patient had an episode of vomiting which is new for patient. However, patient is currently taking an antibiotic for an infection in his toe on his right foot, as well as, starting a new chemotherapeutic drug. Patient is also having increased sinus drainage, coughing and diarrhea. Patient also continues to receive a hormonal therapy as well. Patient's appetite remains good. He remains bedbound and dependent for all ADL's. Patient was engaged with the team. SW provided supportive presence, active listening, assessment of needs and comfort of patient and PCG and provide reassurance of support. Patient and PCG remain open to ongoing palliative care support.  PATIENT/CAREGIVER EDUCATION/ COPING:  Patient and PCG appear to be coping well.  PERSONAL EMERGENCY PLAN:  911 can be activated for emergencies. COMMUNITY RESOURCES COORDINATION/ HEALTH CARE NAVIGATION:  Patient go to the wound clinic for wound care and home health care through [facility].  FINANCIAL/LEGAL CONCERNS/INTERVENTIONS:  None.      SOCIAL HX:  Social History   Tobacco Use   Smoking status: Former    Packs/day: 0.25    Years: 5.00    Pack years: 1.25    Types: Cigarettes    Quit date: 11/14/2007    Years since quitting: 13.8   Smokeless tobacco: Never  Substance Use Topics   Alcohol use: No    CODE STATUS: Full Code ADVANCED DIRECTIVES: No MOST FORM COMPLETE:  Yes HOSPICE EDUCATION PROVIDED: No  PPS:  Patient is alert and oriented x3. He is bedbound and dependent for all ADL's.    Duration of visit and documentation: 60 minutes.   [address] [place], [place]

## 2021-09-04 ENCOUNTER — Other Ambulatory Visit: Payer: Self-pay

## 2021-09-04 MED ORDER — METOPROLOL TARTRATE 25 MG PO TABS: 25.0000 mg | ORAL_TABLET | Freq: Every day | ORAL | 3 refills | Status: AC

## 2021-09-09 DIAGNOSIS — E1122 Type 2 diabetes mellitus with diabetic chronic kidney disease: Secondary | ICD-10-CM | POA: Diagnosis not present

## 2021-09-09 DIAGNOSIS — I639 Cerebral infarction, unspecified: Secondary | ICD-10-CM | POA: Diagnosis not present

## 2021-09-09 DIAGNOSIS — I2510 Atherosclerotic heart disease of native coronary artery without angina pectoris: Secondary | ICD-10-CM | POA: Diagnosis not present

## 2021-09-09 DIAGNOSIS — I5032 Chronic diastolic (congestive) heart failure: Secondary | ICD-10-CM | POA: Diagnosis not present

## 2021-09-09 DIAGNOSIS — I10 Essential (primary) hypertension: Secondary | ICD-10-CM | POA: Diagnosis not present

## 2021-09-09 DIAGNOSIS — G20 Parkinson's disease: Secondary | ICD-10-CM | POA: Diagnosis not present

## 2021-09-09 DIAGNOSIS — N183 Chronic kidney disease, stage 3 unspecified: Secondary | ICD-10-CM | POA: Diagnosis not present

## 2021-09-09 NOTE — Telephone Encounter (Signed)
Pt c/o medication issue:  1. Name of Medication:  NUBEQA (darlutamide) 600 MG   2. How are you currently taking this medication (dosage and times per day)? Twice daily   3. Are you having a reaction (difficulty breathing--STAT)? No   4. What is your medication issue? Legrand Como, a Pharmacist with [facility] is calling to inform the office that Dontreal was recently put on the cancer medication NUBEQA. He states he was speaking with the home health nurse today and they discussed this possibly being the cause of the patient vomiting from the crestor. He states the nurse made him aware he stopped the crestor for a week per our office due to the reaction and just wanted to let the office know if it is decided for him to start back on crestor the recommended dosage is 5 MG's, instead of 20 MG's. He would like a callback with what is determined for the patient in regards to crestor to have their records up to date.

## 2021-09-09 NOTE — Telephone Encounter (Signed)
Routed to MD and pharmD to review crestor dosage   Patient previously taking 20 mg.

## 2021-09-10 ENCOUNTER — Other Ambulatory Visit: Payer: Self-pay

## 2021-09-10 DIAGNOSIS — I130 Hypertensive heart and chronic kidney disease with heart failure and stage 1 through stage 4 chronic kidney disease, or unspecified chronic kidney disease: Secondary | ICD-10-CM | POA: Diagnosis not present

## 2021-09-10 DIAGNOSIS — E1122 Type 2 diabetes mellitus with diabetic chronic kidney disease: Secondary | ICD-10-CM | POA: Diagnosis not present

## 2021-09-10 DIAGNOSIS — I5032 Chronic diastolic (congestive) heart failure: Secondary | ICD-10-CM | POA: Diagnosis not present

## 2021-09-10 DIAGNOSIS — N1832 Chronic kidney disease, stage 3b: Secondary | ICD-10-CM | POA: Diagnosis not present

## 2021-09-10 DIAGNOSIS — Z794 Long term (current) use of insulin: Secondary | ICD-10-CM | POA: Diagnosis not present

## 2021-09-10 DIAGNOSIS — G20 Parkinson's disease: Secondary | ICD-10-CM | POA: Diagnosis not present

## 2021-09-10 DIAGNOSIS — Z466 Encounter for fitting and adjustment of urinary device: Secondary | ICD-10-CM | POA: Diagnosis not present

## 2021-09-10 DIAGNOSIS — S90411D Abrasion, right great toe, subsequent encounter: Secondary | ICD-10-CM | POA: Diagnosis not present

## 2021-09-10 DIAGNOSIS — I69354 Hemiplegia and hemiparesis following cerebral infarction affecting left non-dominant side: Secondary | ICD-10-CM | POA: Diagnosis not present

## 2021-09-10 DIAGNOSIS — L89892 Pressure ulcer of other site, stage 2: Secondary | ICD-10-CM | POA: Diagnosis not present

## 2021-09-10 MED ORDER — FUROSEMIDE 20 MG PO TABS: 20.0000 mg | ORAL_TABLET | Freq: Every day | ORAL | 3 refills | Status: AC

## 2021-09-11 ENCOUNTER — Encounter: Payer: Self-pay | Admitting: *Deleted

## 2021-09-11 NOTE — Telephone Encounter (Signed)
This encounter was created in error - please disregard.

## 2021-09-13 MED ORDER — ROSUVASTATIN CALCIUM 5 MG PO TABS: 5.0000 mg | ORAL_TABLET | Freq: Every day | ORAL | 3 refills | Status: AC

## 2021-09-13 NOTE — Addendum Note (Signed)
Addended by: Beatrix Fetters on: 09/13/2021 04:43 PM   Modules accepted: Orders

## 2021-09-13 NOTE — Telephone Encounter (Signed)
Spoke with pt regarding change to rosuvastatin. Per Dr. Gwenlyn Found pt can re-start back a 5mg  daily. Pt verbalizes understanding. New prescription sent in for pt.

## 2021-09-23 DIAGNOSIS — G20 Parkinson's disease: Secondary | ICD-10-CM | POA: Diagnosis not present

## 2021-09-23 DIAGNOSIS — N1832 Chronic kidney disease, stage 3b: Secondary | ICD-10-CM | POA: Diagnosis not present

## 2021-09-23 DIAGNOSIS — S90411D Abrasion, right great toe, subsequent encounter: Secondary | ICD-10-CM | POA: Diagnosis not present

## 2021-09-23 DIAGNOSIS — L89892 Pressure ulcer of other site, stage 2: Secondary | ICD-10-CM | POA: Diagnosis not present

## 2021-09-23 DIAGNOSIS — I130 Hypertensive heart and chronic kidney disease with heart failure and stage 1 through stage 4 chronic kidney disease, or unspecified chronic kidney disease: Secondary | ICD-10-CM | POA: Diagnosis not present

## 2021-09-23 DIAGNOSIS — I5032 Chronic diastolic (congestive) heart failure: Secondary | ICD-10-CM | POA: Diagnosis not present

## 2021-09-23 DIAGNOSIS — I69354 Hemiplegia and hemiparesis following cerebral infarction affecting left non-dominant side: Secondary | ICD-10-CM | POA: Diagnosis not present

## 2021-09-23 DIAGNOSIS — Z466 Encounter for fitting and adjustment of urinary device: Secondary | ICD-10-CM | POA: Diagnosis not present

## 2021-09-23 DIAGNOSIS — Z794 Long term (current) use of insulin: Secondary | ICD-10-CM | POA: Diagnosis not present

## 2021-09-23 DIAGNOSIS — E1122 Type 2 diabetes mellitus with diabetic chronic kidney disease: Secondary | ICD-10-CM | POA: Diagnosis not present

## 2021-10-03 ENCOUNTER — Encounter (HOSPITAL_BASED_OUTPATIENT_CLINIC_OR_DEPARTMENT_OTHER): Payer: Medicare Other | Admitting: Internal Medicine

## 2021-10-09 DIAGNOSIS — E1122 Type 2 diabetes mellitus with diabetic chronic kidney disease: Secondary | ICD-10-CM | POA: Diagnosis not present

## 2021-10-09 DIAGNOSIS — Z794 Long term (current) use of insulin: Secondary | ICD-10-CM | POA: Diagnosis not present

## 2021-10-09 DIAGNOSIS — Z466 Encounter for fitting and adjustment of urinary device: Secondary | ICD-10-CM | POA: Diagnosis not present

## 2021-10-09 DIAGNOSIS — L89892 Pressure ulcer of other site, stage 2: Secondary | ICD-10-CM | POA: Diagnosis not present

## 2021-10-09 DIAGNOSIS — I130 Hypertensive heart and chronic kidney disease with heart failure and stage 1 through stage 4 chronic kidney disease, or unspecified chronic kidney disease: Secondary | ICD-10-CM | POA: Diagnosis not present

## 2021-10-09 DIAGNOSIS — N1832 Chronic kidney disease, stage 3b: Secondary | ICD-10-CM | POA: Diagnosis not present

## 2021-10-09 DIAGNOSIS — S90411D Abrasion, right great toe, subsequent encounter: Secondary | ICD-10-CM | POA: Diagnosis not present

## 2021-10-09 DIAGNOSIS — G20 Parkinson's disease: Secondary | ICD-10-CM | POA: Diagnosis not present

## 2021-10-09 DIAGNOSIS — I69354 Hemiplegia and hemiparesis following cerebral infarction affecting left non-dominant side: Secondary | ICD-10-CM | POA: Diagnosis not present

## 2021-10-09 DIAGNOSIS — I5032 Chronic diastolic (congestive) heart failure: Secondary | ICD-10-CM | POA: Diagnosis not present

## 2021-10-10 ENCOUNTER — Encounter (HOSPITAL_BASED_OUTPATIENT_CLINIC_OR_DEPARTMENT_OTHER): Payer: Medicare Other | Admitting: Internal Medicine

## 2021-10-11 DIAGNOSIS — R339 Retention of urine, unspecified: Secondary | ICD-10-CM | POA: Diagnosis not present

## 2021-10-12 DIAGNOSIS — N183 Chronic kidney disease, stage 3 unspecified: Secondary | ICD-10-CM | POA: Diagnosis not present

## 2021-10-12 DIAGNOSIS — I10 Essential (primary) hypertension: Secondary | ICD-10-CM | POA: Diagnosis not present

## 2021-10-12 DIAGNOSIS — G20 Parkinson's disease: Secondary | ICD-10-CM | POA: Diagnosis not present

## 2021-10-12 DIAGNOSIS — I2510 Atherosclerotic heart disease of native coronary artery without angina pectoris: Secondary | ICD-10-CM | POA: Diagnosis not present

## 2021-10-12 DIAGNOSIS — I5032 Chronic diastolic (congestive) heart failure: Secondary | ICD-10-CM | POA: Diagnosis not present

## 2021-10-12 DIAGNOSIS — I639 Cerebral infarction, unspecified: Secondary | ICD-10-CM | POA: Diagnosis not present

## 2021-10-16 ENCOUNTER — Encounter: Payer: Self-pay | Admitting: Cardiovascular Disease

## 2021-10-16 ENCOUNTER — Ambulatory Visit: Payer: Medicare Other | Admitting: Cardiovascular Disease

## 2021-10-16 ENCOUNTER — Other Ambulatory Visit: Payer: Self-pay

## 2021-10-16 DIAGNOSIS — I10 Essential (primary) hypertension: Secondary | ICD-10-CM | POA: Diagnosis not present

## 2021-10-16 DIAGNOSIS — E785 Hyperlipidemia, unspecified: Secondary | ICD-10-CM

## 2021-10-16 DIAGNOSIS — Z951 Presence of aortocoronary bypass graft: Secondary | ICD-10-CM | POA: Diagnosis not present

## 2021-10-16 DIAGNOSIS — I6523 Occlusion and stenosis of bilateral carotid arteries: Secondary | ICD-10-CM | POA: Diagnosis not present

## 2021-10-16 NOTE — Patient Instructions (Signed)

## 2021-10-16 NOTE — Assessment & Plan Note (Signed)
History of dyslipidemia on fenofibrate, Zetia and Crestor with lipid profile performed 05/16/2021 revealing total cholesterol 126, LDL 52 and HDL 41.

## 2021-10-16 NOTE — Assessment & Plan Note (Signed)
History of CAD status post coronary artery bypass grafting in 2004 by Dr. Roxan Hockey.  He denies chest pain or shortness of breath.

## 2021-10-16 NOTE — Progress Notes (Signed)
10/16/2021 [name]   02-Nov-1945  [id number]  Primary Physician [name], MD Primary Cardiologist: Lorretta Harp MD [name], [place]  HPI:  [name] is a 76 y.o.  mildly overweight but was Caucasian male father of 4 children grandfather of 8 grandchildren who is accompanied by his first ex-wife [name]  who is his caregiver. I last saw him in the office 05/08/2021. His primary care physician is Dr. [name]. He he is a  retired Administrator. His cardiac risk factor profile was  positive for 75 pack years of tobacco smoking having quit in 2009. He is treated hyperlipidemia. He did have bilateral carotid endarterectomies back in 1999 a sister with a stroke that caused left hemiparesis and paresthesias. He had bilateral carotid stenting performed by Dr. Trula Slade and myself in a staged fashion because of restenosis back in 2012. He also clearly has Parkinson's disease which has been progressive over the last year now requiring him to ambulate with the aid of a walker.. He had coronary bypass grafting in 2004 by Dr. Merilynn Finland and a Myoview stress test performed 01/09/11 was nonischemic. He denies chest pain or shortness of breath. He has been diagnosed with bladder cancer and is currently being treated by Dr. Denton Brick. He has had 4 bladder surgeries and treatment with BCG.. Addition, he was recently diagnosed with Parkinson's disease and is wheelchair-bound.   He was hospitalized at [facility] for a week 06/19/2020 for question of aspiration pneumonia and abscess on his buttocks.  He does have metastatic prostate cancer which is now progressed to his bones getting hormonal therapy.  He no longer is wheelchair-bound but bed to stretcher.  Hospice/palliative care is involved and his advanced directives are such that he is a DNR.   He has been seen by Dr. Dellia Nims at the wound care center .  He also has some wounds on his right first fourth and fifth  toes which Dr. Dellia Nims is treating as well.  He had lower extremity arterial Doppler studies performed 10/24/2020 revealing noncompressible ABIs with a total right SFA and popliteal artery.  His serum creatinine is currently elevated 2.6.  Given his comorbidities and life expectancy I do not feel he is a candidate for endovascular therapy at this time and the family agrees.  Since I saw him 6 months ago he continues to do well.  His wounds are slowly healing.  He continues to be seen at the wound care center.  His ex-wife [name], his caregiver, continues to dress his wounds at home which are slowly healing.  He denies chest pain or shortness of breath.  He is wheelchair-bound because of his metastatic prostate cancer for which he is getting chemotherapy.     Current Meds  Medication Sig   amLODipine (NORVASC) 10 MG tablet Take 1 tablet (10 mg total) by mouth daily.   carbidopa-levodopa (SINEMET IR) 25-100 MG tablet Take 2 tablets by mouth 3 (three) times daily. Change: Take Two tabs 3 x daily   dantrolene (DANTRIUM) 50 MG capsule Take 1 capsule (50 mg total) by mouth 3 (three) times daily.   dipyridamole-aspirin (AGGRENOX) 200-25 MG 12hr capsule Take 1 capsule by mouth 2 (two) times daily.   ezetimibe (ZETIA) 10 MG tablet Take 1 tablet (10 mg total) by mouth daily.   fenofibrate (TRICOR) 48 MG tablet Take 1 tablet (48 mg total) by mouth daily.   FOLBIC 2.5-25-2 MG TABS tablet Take 1 tablet  by mouth once daily with food   furosemide (LASIX) 20 MG tablet Take 1 tablet (20 mg total) by mouth daily.   hydrALAZINE (APRESOLINE) 25 MG tablet Take 1 tablet (25 mg total) by mouth 3 (three) times daily. TAKE ONE TABLET BY MOUTH THREE TIMES A DAY   insulin glargine (LANTUS) 100 UNIT/ML Solostar Pen Inject 10 Units into the skin daily.   KEPPRA 500 MG tablet Take 1 tablet (500 mg total) by mouth 2 (two) times daily.   Leuprolide Acetate (ELIGARD) 7.5 MG injection See admin instructions.   metoprolol  tartrate (LOPRESSOR) 25 MG tablet Take 1 tablet (25 mg total) by mouth daily.   NUBEQA 300 MG tablet Take 600 mg by mouth 2 (two) times daily.   ONETOUCH DELICA LANCETS 84Z MISC    ONETOUCH VERIO test strip    Probiotic CHEW Chew 1 tablet by mouth 2 (two) times daily.   RAPAFLO 8 MG CAPS capsule Take 8 mg by mouth daily with breakfast.    rosuvastatin (CRESTOR) 5 MG tablet Take 1 tablet (5 mg total) by mouth daily.   sulfamethoxazole-trimethoprim (BACTRIM DS) 800-160 MG tablet Take 1 tablet by mouth daily.     Allergies  Allergen Reactions   Altace [Ramipril] Cough   Keflex [Cephalexin] Itching and Rash    Social History   Socioeconomic History   Marital status: Divorced    Spouse name: Not on file   Number of children: 4   Years of education: HS   Highest education level: Not on file  Occupational History    Employer: RETIRED  Tobacco Use   Smoking status: Former    Packs/day: 0.25    Years: 5.00    Pack years: 1.25    Types: Cigarettes    Quit date: 11/14/2007    Years since quitting: 13.9   Smokeless tobacco: Never  Vaping Use   Vaping Use: Never used  Substance and Sexual Activity   Alcohol use: No   Drug use: No   Sexual activity: Not Currently  Other Topics Concern   Not on file  Social History Narrative   Patient lives at home with his caregiver/ex-wife.   Caffeine- 2 cups of coffee daily   Social Determinants of Health   Financial Resource Strain: Not on file  Food Insecurity: Not on file  Transportation Needs: Not on file  Physical Activity: Not on file  Stress: Not on file  Social Connections: Not on file  Intimate Partner Violence: Not on file     Review of Systems: General: negative for chills, fever, night sweats or weight changes.  Cardiovascular: negative for chest pain, dyspnea on exertion, edema, orthopnea, palpitations, paroxysmal nocturnal dyspnea or shortness of breath Dermatological: negative for rash Respiratory: negative for cough or  wheezing Urologic: negative for hematuria Abdominal: negative for nausea, vomiting, diarrhea, bright red blood per rectum, melena, or hematemesis Neurologic: negative for visual changes, syncope, or dizziness All other systems reviewed and are otherwise negative except as noted above.    Blood pressure (!) 98/58, pulse (!) 55, height 5\' 4"  (1.626 m), SpO2 98 %.  General appearance: alert and no distress Neck: no adenopathy, no carotid bruit, no JVD, supple, symmetrical, trachea midline, and thyroid not enlarged, symmetric, no tenderness/mass/nodules Lungs: clear to auscultation bilaterally Heart: regular rate and rhythm, S1, S2 normal, no murmur, click, rub or gallop Extremities: extremities normal, atraumatic, no cyanosis or edema Pulses: Absent pedal pulses Skin: Skin color, texture, turgor normal. No rashes or lesions Neurologic:  Grossly normal  EKG sinus bradycardia 55 with nonspecific ST and T wave changes.  I personally reviewed this EKG.  ASSESSMENT AND PLAN:   Carotid stenosis History of bilateral carotid endarterectomies as well as bilateral carotid stenting by myself and Dr. Trula Slade in the past.  His last carotid Dopplers performed 11/01/2018 revealed widely patent carotid stents.  Hx of CABG History of CAD status post coronary artery bypass grafting in 2004 by Dr. Roxan Hockey.  He denies chest pain or shortness of breath.  Dyslipidemia, goal LDL below 70 History of dyslipidemia on fenofibrate, Zetia and Crestor with lipid profile performed 05/16/2021 revealing total cholesterol 126, LDL 52 and HDL 41.  Essential hypertension History of peripheral arterial disease with duplex evidence of occluded SFAs bilaterally, with noncompressible ABIs.  He does have some ulcers on his right foot that are slowly healing and followed by Dr. Dellia Nims at the wound care center.     Lorretta Harp MD FACP,FACC,FAHA, [facility] 10/16/2021 11:44 AM

## 2021-10-16 NOTE — Assessment & Plan Note (Signed)
History of peripheral arterial disease with duplex evidence of occluded SFAs bilaterally, with noncompressible ABIs.  He does have some ulcers on his right foot that are slowly healing and followed by Dr. Dellia Nims at the wound care center.

## 2021-10-16 NOTE — Assessment & Plan Note (Signed)
History of bilateral carotid endarterectomies as well as bilateral carotid stenting by myself and Dr. Trula Slade in the past.  His last carotid Dopplers performed 11/01/2018 revealed widely patent carotid stents.

## 2021-10-17 ENCOUNTER — Encounter (HOSPITAL_BASED_OUTPATIENT_CLINIC_OR_DEPARTMENT_OTHER): Payer: Medicare Other | Attending: Internal Medicine | Admitting: Internal Medicine

## 2021-10-17 DIAGNOSIS — L97514 Non-pressure chronic ulcer of other part of right foot with necrosis of bone: Secondary | ICD-10-CM | POA: Insufficient documentation

## 2021-10-17 DIAGNOSIS — E11621 Type 2 diabetes mellitus with foot ulcer: Secondary | ICD-10-CM | POA: Diagnosis not present

## 2021-10-17 DIAGNOSIS — L97528 Non-pressure chronic ulcer of other part of left foot with other specified severity: Secondary | ICD-10-CM | POA: Diagnosis not present

## 2021-10-17 DIAGNOSIS — C7919 Secondary malignant neoplasm of other urinary organs: Secondary | ICD-10-CM | POA: Diagnosis not present

## 2021-10-17 DIAGNOSIS — I739 Peripheral vascular disease, unspecified: Secondary | ICD-10-CM | POA: Diagnosis not present

## 2021-10-17 DIAGNOSIS — E1151 Type 2 diabetes mellitus with diabetic peripheral angiopathy without gangrene: Secondary | ICD-10-CM | POA: Insufficient documentation

## 2021-10-17 NOTE — Progress Notes (Signed)
ORONDE, HALLENBECK T ([id number]) . Visit Report for 10/17/2021 Arrival Information Details Patient Name: Date of Service: [name], [name] [facility] T. 10/17/2021 12:30 PM Medical Record Number: [id number] Patient Account Number: [id number] Date of Birth/Sex: Treating RN: Aug 13, 1946 (76 y.o. [name] Primary Care [name]: [name] Other Clinician: Referring [name]: Treating [name]/Extender: [name] in Treatment: 63 Visit Information History Since Last Visit Added or deleted any medications: Yes Patient Arrived: Wheel Chair Any new allergies or adverse reactions: No Arrival Time: 12:42 Had a fall or experienced change in No Accompanied By: spouse activities of daily living that may affect Transfer Assistance: None risk of falls: Patient Identification Verified: Yes Signs or symptoms of abuse/neglect since last visito No Secondary Verification Process Completed: Yes Hospitalized since last visit: No Patient Requires Transmission-Based Precautions: No Implantable device outside of the clinic excluding No Patient Has Alerts: Yes cellular tissue based products placed in the center Patient Alerts: Patient on Blood Thinner since last visit: Has Dressing in Place as Prescribed: Yes Pain Present Now: No Electronic Signature(s) Signed: 10/17/2021 5:48:30 PM By: Luis Hurst RN, BSN Entered By: [name] on 10/17/2021 12:42:57 -------------------------------------------------------------------------------- Clinic Level of Care Assessment Details Patient Name: Date of Service: Luis Pence HN T. 10/17/2021 12:30 PM Medical Record Number: [id number] Patient Account Number: [id number] Date of Birth/Sex: Treating RN: [birth year] (76 y.o. [name] Primary Care [name]: [name] Other Clinician: Referring [name]: Treating [name]: [name] in Treatment: 63 Clinic Level of Care Assessment  Items TOOL 4 Quantity Score X- 1 0 Use when only an EandM is performed on FOLLOW-UP visit ASSESSMENTS - Nursing Assessment / Reassessment X- 1 10 Reassessment of Co-morbidities (includes updates in patient status) X- 1 5 Reassessment of Adherence to Treatment Plan ASSESSMENTS - Wound and Skin A ssessment / Reassessment []  - 0 Simple Wound Assessment / Reassessment - one wound X- 2 5 Complex Wound Assessment / Reassessment - multiple wounds []  - 0 Dermatologic / Skin Assessment (not related to wound area) ASSESSMENTS - Focused Assessment []  - 0 Circumferential Edema Measurements - multi extremities []  - 0 Nutritional Assessment / Counseling / Intervention X- 1 5 Lower Extremity Assessment (monofilament, tuning fork, pulses) []  - 0 Peripheral Arterial Disease Assessment (using hand held doppler) ASSESSMENTS - Ostomy and/or Continence Assessment and Care []  - 0 Incontinence Assessment and Management []  - 0 Ostomy Care Assessment and Management (repouching, etc.) PROCESS - Coordination of Care X - Simple Patient / Family Education for ongoing care 1 15 []  - 0 Complex (extensive) Patient / Family Education for ongoing care X- 1 10 Staff obtains Programmer, systems, Records, T Results / Process Orders est []  - 0 Staff telephones HHA, Nursing Homes / Clarify orders / etc []  - 0 Routine Transfer to another Facility (non-emergent condition) []  - 0 Routine Hospital Admission (non-emergent condition) []  - 0 New Admissions / Biomedical engineer / Ordering NPWT Apligraf, etc. , []  - 0 Emergency Hospital Admission (emergent condition) X- 1 10 Simple Discharge Coordination []  - 0 Complex (extensive) Discharge Coordination PROCESS - Special Needs []  - 0 Pediatric / Minor Patient Management []  - 0 Isolation Patient Management []  - 0 Hearing / Language / Visual special needs []  - 0 Assessment of Community assistance (transportation, D/C planning, etc.) []  - 0 Additional  assistance / Altered mentation []  - 0 Support Surface(s) Assessment (bed, cushion, seat, etc.) INTERVENTIONS - Wound Cleansing / Measurement []  - 0 Simple Wound Cleansing - one wound X- 2 5  Complex Wound Cleansing - multiple wounds X- 1 5 Wound Imaging (photographs - any number of wounds) $RemoveBe'[]'nnDWXIdTB$  - 0 Wound Tracing (instead of photographs) $RemoveBeforeD'[]'cGHWXHUgifWFsV$  - 0 Simple Wound Measurement - one wound X- 2 5 Complex Wound Measurement - multiple wounds INTERVENTIONS - Wound Dressings X - Small Wound Dressing one or multiple wounds 2 10 $Re'[]'RrI$  - 0 Medium Wound Dressing one or multiple wounds $RemoveBeforeD'[]'JhfCKHWRPuUVzD$  - 0 Large Wound Dressing one or multiple wounds X- 1 5 Application of Medications - topical $RemoveB'[]'ATeMapDE$  - 0 Application of Medications - injection INTERVENTIONS - Miscellaneous $RemoveBeforeD'[]'YlqWCLYeAgEZBD$  - 0 External ear exam $Remove'[]'upOLTKD$  - 0 Specimen Collection (cultures, biopsies, blood, body fluids, etc.) $RemoveBefor'[]'ojnBFMZXRCzg$  - 0 Specimen(s) / Culture(s) sent or taken to Lab for analysis $RemoveBefo'[]'ZyqxOGycPyP$  - 0 Patient Transfer (multiple staff / Civil Service fast streamer / Similar devices) $RemoveBeforeDE'[]'cbhVrmRZyRCDaSk$  - 0 Simple Staple / Suture removal (25 or less) $Remove'[]'xlLjdgT$  - 0 Complex Staple / Suture removal (26 or more) $Remove'[]'nPbMJli$  - 0 Hypo / Hyperglycemic Management (close monitor of Blood Glucose) $RemoveBefore'[]'TEnKHvUxReDvu$  - 0 Ankle / Brachial Index (ABI) - do not check if billed separately X- 1 5 Vital Signs Has the patient been seen at the hospital within the last three years: Yes Total Score: 120 Level Of Care: New/Established - Level 4 Electronic Signature(s) Signed: 10/17/2021 5:48:30 PM By: Luis Hurst RN, BSN Entered By: [name] on 10/17/2021 17:23:45 -------------------------------------------------------------------------------- Encounter Discharge Information Details Patient Name: Date of Service: Luis Pence HN T. 10/17/2021 12:30 PM Medical Record Number: [id number] Patient Account Number: [id number] Date of Birth/Sex: Treating RN: 04/16/1946 (76 y.o. [name] Primary Care Sherilee Smotherman: [name] Other  Clinician: Referring Lenay Lovejoy: Treating Makinley Muscato/Extender: [name] in Treatment: 54 Encounter Discharge Information Items Discharge Condition: Stable Ambulatory Status: Wheelchair Discharge Destination: Home Transportation: Private Auto Accompanied By: spouse Schedule Follow-up Appointment: Yes Clinical Summary of Care: Patient Declined Electronic Signature(s) Signed: 10/17/2021 5:48:30 PM By: Luis Hurst RN, BSN Entered By: [name] on 10/17/2021 17:24:38 -------------------------------------------------------------------------------- Lower Extremity Assessment Details Patient Name: Date of Service: Luis Pence HN T. 10/17/2021 12:30 PM Medical Record Number: [id number] Patient Account Number: [id number] Date of Birth/Sex: Treating RN: [birth year] (76 y.o. [name] Primary Care Chandell Attridge: [name] Other Clinician: Referring Joey Lierman: Treating Dominica Kent/Extender: [name] in Treatment: 63 Edema Assessment Assessed: Shirlyn Goltz: No] Patrice Paradise: No] Edema: [Left: N] [Right: o] Calf Left: Right: Point of Measurement: 27 cm From Medial Instep 25 cm Ankle Left: Right: Point of Measurement: 9 cm From Medial Instep 17 cm Vascular Assessment Pulses: Dorsalis Pedis Palpable: [Right:Yes] Electronic Signature(s) Signed: 10/17/2021 5:48:30 PM By: Luis Hurst RN, BSN Entered By: [name] on 10/17/2021 13:07:58 -------------------------------------------------------------------------------- Multi Wound Chart Details Patient Name: Date of Service: Luis Pence HN T. 10/17/2021 12:30 PM Medical Record Number: [id number] Patient Account Number: [id number] Date of Birth/Sex: Treating RN: May 26, 1946 (76 y.o. [name] Primary Care Zahlia Deshazer: [name] Other Clinician: Referring Dewon Mendizabal: Treating Toluwanimi Radebaugh/Extender: [name] in Treatment: 40 Vital Signs Height(in):  64 Capillary Blood Glucose(mg/dl): 146 Weight(lbs): 150 Pulse(bpm): 55 Body Mass Index(BMI): 26 Blood Pressure(mmHg): 123/73 Temperature(F): 98.2 Respiratory Rate(breaths/min): 16 Photos: [N/A:N/A] Right T Great oe Right T Fourth oe N/A Wound Location: Not Known Gradually Appeared N/A Wounding Event: Diabetic Wound/Ulcer of the Lower Diabetic Wound/Ulcer of the Lower N/A Primary Etiology: Extremity Extremity Congestive Heart Failure, Coronary Congestive Heart Failure, Coronary N/A Comorbid History: Artery Disease, Hypertension, Type II Artery Disease, Hypertension, Type II Diabetes, Seizure Disorder Diabetes, Seizure Disorder 10/11/2020  11/22/2020 N/A Date Acquired: 21 47 N/A Weeks of Treatment: Open Open N/A Wound Status: 1x1x0.1 1x1.6x0.1 N/A Measurements L x W x D (cm) 0.785 1.257 N/A A (cm) : rea 0.079 0.126 N/A Volume (cm) : 28.60% -166.90% N/A % Reduction in A rea: 28.20% -168.10% N/A % Reduction in Volume: Grade 2 Grade 2 N/A Classification: Medium Small N/A Exudate A mount: Serosanguineous Purulent N/A Exudate Type: red, brown yellow, brown, green N/A Exudate Color: Distinct, outline attached Distinct, outline attached N/A Wound Margin: None Present (0%) None Present (0%) N/A Granulation A mount: Large (67-100%) Large (67-100%) N/A Necrotic A mount: Eschar Eschar N/A Necrotic Tissue: Fat Layer (Subcutaneous Tissue): Yes Fat Layer (Subcutaneous Tissue): Yes N/A Exposed Structures: Fascia: No Fascia: No Tendon: No Tendon: No Muscle: No Muscle: No Joint: No Joint: No Bone: No Bone: No Medium (34-66%) None N/A Epithelialization: Treatment Notes Electronic Signature(s) Signed: 10/17/2021 1:46:05 PM By: Kalman Shan DO Signed: 10/17/2021 5:48:30 PM By: Luis Hurst RN, BSN Entered By: Kalman Shan on 10/17/2021 13:23:28 -------------------------------------------------------------------------------- Multi-Disciplinary Care Plan  Details Patient Name: Date of Service: Luis Pence HN T. 10/17/2021 12:30 PM Medical Record Number: [id number] Patient Account Number: [id number] Date of Birth/Sex: Treating RN: Oct 28, 1945 (76 y.o. [name] Primary Care Kelly Eisler: [name] Other Clinician: Referring Avin Upperman: Treating Rowyn Spilde/Extender: [name] in Treatment: [place] reviewed with physician Active Inactive Wound/Skin Impairment Nursing Diagnoses: Impaired tissue integrity Knowledge deficit related to ulceration/compromised skin integrity Goals: Patient/caregiver will verbalize understanding of skin care regimen Date Initiated: 07/27/2020 Target Resolution Date: 12/06/2021 Goal Status: Active Ulcer/skin breakdown will have a volume reduction of 30% by week 4 Date Initiated: 07/27/2020 Date Inactivated: 09/13/2020 Target Resolution Date: 08/24/2020 Goal Status: Met Interventions: Assess patient/caregiver ability to obtain necessary supplies Assess patient/caregiver ability to perform ulcer/skin care regimen upon admission and as needed Assess ulceration(s) every visit Provide education on ulcer and skin care Treatment Activities: Skin care regimen initiated : 07/27/2020 Topical wound management initiated : 07/27/2020 Notes: 08/15/21: Wound care regimen continues. Dressings changed by [facility] and family. Electronic Signature(s) Signed: 10/17/2021 5:48:30 PM By: Luis Hurst RN, BSN Entered By: [name] on 10/17/2021 13:06:48 -------------------------------------------------------------------------------- Pain Assessment Details Patient Name: Date of Service: Luis Pence HN T. 10/17/2021 12:30 PM Medical Record Number: [id number] Patient Account Number: [id number] Date of Birth/Sex: Treating RN: October 19, 1945 (76 y.o. [name] Primary Care Johnna Bollier: [name] Other Clinician: Referring Justin Buechner: Treating Aysiah Jurado/Extender:  [name] in Treatment: 71 Active Problems Location of Pain Severity and Description of Pain Patient Has Paino No Site Locations Pain Management and Medication Current Pain Management: Electronic Signature(s) Signed: 10/17/2021 5:48:30 PM By: Luis Hurst RN, BSN Entered By: [name] on 10/17/2021 12:43:32 -------------------------------------------------------------------------------- Patient/Caregiver Education Details Patient Name: Date of Service: [name] 1/5/2023andnbsp12:30 PM Medical Record Number: [id number] Patient Account Number: [id number] Date of Birth/Gender: Treating RN: 1946/05/04 (76 y.o. [name] Primary Care Physician: [name] Other Clinician: Referring Physician: Treating Physician/Extender: [name] in Treatment: 73 Education Assessment Education Provided To: Patient Education Topics Provided Wound/Skin Impairment: Methods: Explain/Verbal Responses: State content correctly [employer]) Signed: 10/17/2021 5:48:30 PM By: Luis Hurst RN, BSN Entered By: [name] on 10/17/2021 13:07:03 -------------------------------------------------------------------------------- Wound Assessment Details Patient Name: Date of Service: Luis Pence HN T. 10/17/2021 12:30 PM Medical Record Number: [id number] Patient Account Number: [id number] Date of Birth/Sex: Treating RN: August 12, 1946 (76 y.o. [name] Primary Care Aulden Calise: [name] Other Clinician:  Referring Anaily Ashbaugh: Treating Jaliel Deavers/Extender: [name] in Treatment: 63 Wound Status Wound Number: 4 Primary Diabetic Wound/Ulcer of the Lower Extremity Etiology: Wound Location: Right T Great oe Wound Open Wounding Event: Not Known Status: Date Acquired: 10/11/2020 Comorbid Congestive Heart Failure, Coronary Artery Disease, Weeks Of Treatment: 52 History:  Hypertension, Type II Diabetes, Seizure Disorder Clustered Wound: No Photos Wound Measurements Length: (cm) 1 Width: (cm) 1 Depth: (cm) 0.1 Area: (cm) 0.785 Volume: (cm) 0.079 % Reduction in Area: 28.6% % Reduction in Volume: 28.2% Epithelialization: Medium (34-66%) Wound Description Classification: Grade 2 Wound Margin: Distinct, outline attached Exudate Amount: Medium Exudate Type: Serosanguineous Exudate Color: red, brown Foul Odor After Cleansing: No Slough/Fibrino No Wound Bed Granulation Amount: None Present (0%) Exposed Structure Necrotic Amount: Large (67-100%) Fascia Exposed: No Necrotic Quality: Eschar Fat Layer (Subcutaneous Tissue) Exposed: Yes Tendon Exposed: No Muscle Exposed: No Joint Exposed: No Bone Exposed: No Treatment Notes Wound #4 (Toe Great) Wound Laterality: Right Cleanser Wound Cleanser Discharge Instruction: Cleanse the wound with wound cleanser prior to applying a clean dressing using gauze sponges, not tissue or cotton balls. Peri-Wound Care Topical Primary Dressing Betadine Discharge Instruction: apply betadine to wound. Secondary Dressing Woven Gauze Sponges 2x2 in Discharge Instruction: Apply over primary dressing as directed. Secured With Conforming Stretch Gauze Bandage, Sterile 2x75 (in/in) Discharge Instruction: Secure with stretch gauze as directed. Compression Wrap Compression Stockings Add-Ons Electronic Signature(s) Signed: 10/17/2021 5:48:30 PM By: Luis Hurst RN, BSN Entered By: [name] on 10/17/2021 12:51:35 -------------------------------------------------------------------------------- Wound Assessment Details Patient Name: Date of Service: Luis Pence HN T. 10/17/2021 12:30 PM Medical Record Number: [id number] Patient Account Number: [id number] Date of Birth/Sex: Treating RN: Nov 29, 1945 (76 y.o. [name] Primary Care Kylar Leonhardt: [name] Other Clinician: Referring Tamikia Chowning: Treating  Wyatt Galvan/Extender: [name] in Treatment: 63 Wound Status Wound Number: 6 Primary Diabetic Wound/Ulcer of the Lower Extremity Etiology: Wound Location: Right T Fourth oe Wound Open Wounding Event: Gradually Appeared Status: Date Acquired: 11/22/2020 Comorbid Congestive Heart Failure, Coronary Artery Disease, Weeks Of Treatment: 47 History: Hypertension, Type II Diabetes, Seizure Disorder Clustered Wound: No Photos Wound Measurements Length: (cm) 1 Width: (cm) 1.6 Depth: (cm) 0.1 Area: (cm) 1.257 Volume: (cm) 0.126 % Reduction in Area: -166.9% % Reduction in Volume: -168.1% Epithelialization: None Tunneling: No Undermining: No Wound Description Classification: Grade 2 Wound Margin: Distinct, outline attached Exudate Amount: Small Exudate Type: Purulent Exudate Color: yellow, brown, green Foul Odor After Cleansing: No Slough/Fibrino No Wound Bed Granulation Amount: None Present (0%) Exposed Structure Necrotic Amount: Large (67-100%) Fascia Exposed: No Necrotic Quality: Eschar Fat Layer (Subcutaneous Tissue) Exposed: Yes Tendon Exposed: No Muscle Exposed: No Joint Exposed: No Bone Exposed: No Treatment Notes Wound #6 (Toe Fourth) Wound Laterality: Right Cleanser Wound Cleanser Discharge Instruction: Cleanse the wound with wound cleanser prior to applying a clean dressing using gauze sponges, not tissue or cotton balls. Peri-Wound Care Topical Primary Dressing Betadine Discharge Instruction: apply betadine to wound. Secondary Dressing Woven Gauze Sponges 2x2 in Discharge Instruction: Apply over primary dressing as directed. Secured With Conforming Stretch Gauze Bandage, Sterile 2x75 (in/in) Discharge Instruction: Secure with stretch gauze as directed. Compression Wrap Compression Stockings Add-Ons Electronic Signature(s) Signed: 10/17/2021 5:48:30 PM By: Luis Hurst RN, BSN Entered By: [name] on 10/17/2021  12:51:02 -------------------------------------------------------------------------------- Vitals Details Patient Name: Date of Service: Luis Pence HN T. 10/17/2021 12:30 PM Medical Record Number: [id number] Patient Account Number: [id number] Date of Birth/Sex: Treating RN: 07-19-1946 (76 y.o. [name] Primary  Care Oleta Gunnoe: [name] Other Clinician: Referring Seyed Heffley: Treating Talene Glastetter/Extender: [name] in Treatment: 50 Vital Signs Time Taken: 12:42 Temperature (F): 98.2 Height (in): 64 Pulse (bpm): 64 Weight (lbs): 150 Respiratory Rate (breaths/min): 16 Body Mass Index (BMI): 25.7 Blood Pressure (mmHg): 123/73 Capillary Blood Glucose (mg/dl): 146 Reference Range: 80 - 120 mg / dl Notes glucose per pt report this AM Electronic Signature(s) Signed: 10/17/2021 5:48:30 PM By: Luis Hurst RN, BSN Entered By: [name] on 10/17/2021 12:43:24

## 2021-10-18 DIAGNOSIS — S90411D Abrasion, right great toe, subsequent encounter: Secondary | ICD-10-CM | POA: Diagnosis not present

## 2021-10-18 DIAGNOSIS — I5032 Chronic diastolic (congestive) heart failure: Secondary | ICD-10-CM | POA: Diagnosis not present

## 2021-10-18 DIAGNOSIS — Z794 Long term (current) use of insulin: Secondary | ICD-10-CM | POA: Diagnosis not present

## 2021-10-18 DIAGNOSIS — I130 Hypertensive heart and chronic kidney disease with heart failure and stage 1 through stage 4 chronic kidney disease, or unspecified chronic kidney disease: Secondary | ICD-10-CM | POA: Diagnosis not present

## 2021-10-18 DIAGNOSIS — Z466 Encounter for fitting and adjustment of urinary device: Secondary | ICD-10-CM | POA: Diagnosis not present

## 2021-10-18 DIAGNOSIS — E1122 Type 2 diabetes mellitus with diabetic chronic kidney disease: Secondary | ICD-10-CM | POA: Diagnosis not present

## 2021-10-18 DIAGNOSIS — G20 Parkinson's disease: Secondary | ICD-10-CM | POA: Diagnosis not present

## 2021-10-18 DIAGNOSIS — I69354 Hemiplegia and hemiparesis following cerebral infarction affecting left non-dominant side: Secondary | ICD-10-CM | POA: Diagnosis not present

## 2021-10-18 DIAGNOSIS — N1832 Chronic kidney disease, stage 3b: Secondary | ICD-10-CM | POA: Diagnosis not present

## 2021-10-18 DIAGNOSIS — L89892 Pressure ulcer of other site, stage 2: Secondary | ICD-10-CM | POA: Diagnosis not present

## 2021-10-18 NOTE — Progress Notes (Signed)
MADSEN, RIDDLE [name] ([id number]) . Visit Report for 10/17/2021 Chief Complaint Document Details Patient Name: Date of Service: [name], [name] [facility] [name]. 10/17/2021 12:30 PM Medical Record Number: [id number] Patient Account Number: [id number] Date of Birth/Sex: Treating RN: 1945-11-26 (76 y.o. [name] Primary Care Provider: [name] Other Clinician: Referring Provider: Treating Provider/Extender: Theodosia Quay in Treatment: 98 Information Obtained from: Patient Chief Complaint Bilateral toe wounds Electronic Signature(s) Signed: 10/17/2021 1:46:05 PM By: Kalman Shan DO Entered By: Kalman Shan on 10/17/2021 13:23:40 -------------------------------------------------------------------------------- HPI Details Patient Name: Date of Service: [name]. 10/17/2021 12:30 PM Medical Record Number: [id number] Patient Account Number: [id number] Date of Birth/Sex: Treating RN: 18-Jul-1946 (76 y.o. [name] Primary Care Provider: [name] Other Clinician: Referring Provider: Treating Provider/Extender: Theodosia Quay in Treatment: 49 History of Present Illness HPI Description: ADMISSION 07/27/2020 This is a 76 year old man who is accompanied by his wife (ex). Nevertheless she is his active caregiver. His most complicated features are remote CVA that left him with a left hemiparesis. He also has widely metastatic prostate cancer to bone which I think is contributed to increasing frailty this year and he is no longer ambulatory. He required hospitalization from 9/7 through 9/14 with respiratory failure seizures. During this time it was noted that he had an open area in the left buttock in close proximity to the gluteal cleft. His wife tells me that he has a history of perirectal abscesses. And she describes this area as starting as a painful swelling in late August certainly sounds like an abscess. When he is in the  hospital he required bedside IandD's but did not go to the OR. Has CT scan of the area showed soft tissue thickening in the perianal ulcer. At that point it was concerning for a fistula connection however a discrete abscess was not seen. Notable that he was hypoalbuminemic in the hospital but actually came out better with an albumin of 3.1 on 9/14 his wife says he is eating well. They are currently treating this with Santyl ointment and a backing wet-to-dry dressing. By description of his wife this is done quite nicely and there is certainly less debris over the wound surface. They already have a hospital bed with a level 3 pressure relief surface. The patient lies on his back when he is in bed especially at night although his wife is counseled him not to do this. They are starting sliding board transfers into his wheelchair I think with physical therapy. He has a Foley catheter in place Past medical history includes remote CVA, metastatic prostate cancer to large areas of his thoracic and lumbar spine and pelvis., Bilateral carotid artery stenosis, Parkinson's disease, type 2 diabetes, diastolic heart failure, CHF, hypertension and a recently discovered possible pancreatic mass in the head of the pancreas 08/16/2020 patient I admitted to the clinic 2-1/2 weeks ago. He had an abscess site on his left buttock that required an IandD while he was in the hospital in September. He has been using Santyl backing wet-to-dry. He is cared for her aerobically at home by his wife 2 concerning areas on his feet. A stage I pressure injury on the right heel this does not have an open wound. He also has on the lateral aspect of the left fourth toe a dry circular eschared wound. 12/2; monthly follow-up. Is a very disabled man who had an abscess in his left buttock and required an IandD. We have been using Santyl to  this area I changed to silver collagen last time he is here and this actually looks quite a bit  better The last time he was here his wife showed me an area on the left lateral fourth toe I think a pressure injury with his fifth toe. Been using Santyl here as well but not making much improvement As well she shows me he has an area on his right buttock which is very tiny pinpoint area but under illumination still obviously an open area. I wonder if this is more of a shear injury and transferring and a pressure injury per se 12/16; this is a very disabled man who has a left buttock area that required an IandD apparently an abscess. Since he has come here that he developed a area on the right buttock. His wife says that this is draining pus. Finally he has an area on the lateral part of the left fourth toe. We have been using silver collagen on the buttock and Iodoflex on the fourth toe 1/6; this is a disabled man we have been working on a left buttock area that was initially an IandD abscess injury. We have got this down to a small area in fact it appears to be epithelializing towards the base of the wound. When he was here last time he had a small draining area on the right buttock. I thought this might be a small cyst culture of the drainage was negative. I do not think this was an abscess at all While he has been here he has been developing areas on the toes on his left foot. He initially had one on the left fourth toe and then last time the right first toe. He is a type II diabetic. With he has a history of coronary artery disease. He complains of a lot of pain in the right foot 1/13; we have been working on areas on his left buttock and then on the right buttock. Both of these are healed today albeit the area on the left has some depth with skin is gone into a divot. Nevertheless I think this is a reasonable outcome. He arrived a week ago with ischemic looking wounds on his right fifth, right first and his left fourth toe. He went on to have arterial studies which we received today these  showed an ABI that was noncompressible on both sides however the great toe on the right had a TBI of 0.14 with a pressure of only 17. Monophasic waveforms on all the tibial vessels and including the popliteal on the left he had monophasic waveforms again noncompressible vessels and no recorded toe pressure. Clearly these are ischemic wounds. Fortunately he is not complaining of a lot of pain The patient has a complicated medical situation which includes widely metastatic prostate cancer to bone for which she is on palliative care he also has Parkinson's disease. I have a recent note from his neurologist at which time he asked whether they wish to de-escalate the Parkinson's regimen and the answer was no. The patient has a follow-up appointment with Dr. Gwenlyn Found on 1/28. I believe he is the patient's cardiologist but this appointment was made because of the vascular studies I think predominantly through Dr. Kennon Holter office. 2/10; patient went back to see Dr. Gwenlyn Found. He did not feel given his comorbidities that he was a candidate for endovascular therapy. His wife seems to agree with that. He has ischemic wounds on his right toes particularly the right fifth toe fourth toe  and first toe. He is not in a lot of pain unless these are manipulated 12/20/2020 on evaluation today patient appears to be doing well with regard to his feet all things considered. There does not appear to be any obvious signs of infection here. Most of the wounds are actually for the most part eschar and dry. With that being said the patient is not actually a candidate for revascularization according to Dr. Gwenlyn Found who he has seen previous. Nonetheless I think that for the time being Betadine or something of that sort just keep things clean and dry will be a good way to go. 3/24; patient has 3 areas on the dorsal right toes first fourth and fifth that are necrotic probably ischemic. He was not felt to be a candidate for revascularization  therapy. He also has an area on the left fourth toe in a similar situation. His significant other is concerned about possibility of an abscess on his buttock. He was put on antibiotics last time. I must say that when he was here before he seemed to have recurrent small abscesses but the purulence in this culture is negative I wondered about an epidermoid cyst although this area currently is not in the same spot 4/21; patient presents for his 1 month follow-up. He has chronic necrotic right toes. The affected toes include the first fourth and fifth digits. The third toe wound has closed. He uses Betadine on this daily. He has no complaints today. He denies fever chills, erythema to the wound sites. 5/19; patient presents for 1 month follow-up. He has had no issues in the past month with his bilateral toe wounds due to arterial insufficiency. He continues to use Betadine on these daily. He has no complaints today. He denies signs of infection. 6/30; patient presents for 6-week follow-up. He denies any issues. He has been using Betadine to the areas of necrosis on the right toes. He denies signs of infection. 8/25; patient presents for 98-month follow-up. He states that 1 week ago he developed some thick drainage from the fourth toe near the toenail. Patient has been putting antibiotic ointment to this area. He denies increased warmth or erythema to the fourth toe. He denies systemic signs of infection. He continues to use Betadine to the other necrotic areas. 9/1; patient states he has taken Bactrim for the past week. He noticed yellow drainage until 2 days ago when it stopped. He denies increased warmth or erythema to the area. He denies systemic signs of infection. 9/8; patient presents for follow-up. He has completed his second round of Bactrim. He has not noticed any more drainage. He overall feels well. He has no issues or complaints today. He denies signs of infection. 11/3; patient presents for  follow-up. He has no issues or complaints today. He states that over the past 2 months he has had no issues with the toe wounds. He continues to use Betadine to them. He has not identified any signs of infection. He has no pain. 1/5; patient presents for follow-up. He has no issues or complaints today. He reports stability to the wound sites. He continues to use Betadine. He has no pain. Electronic Signature(s) Signed: 10/17/2021 1:46:05 PM By: Kalman Shan DO Entered By: Kalman Shan on 10/17/2021 13:24:23 -------------------------------------------------------------------------------- Physical Exam Details Patient Name: Date of Service: [name]. 10/17/2021 12:30 PM Medical Record Number: [id number] Patient Account Number: [id number] Date of Birth/Sex: Treating RN: 05-Nov-1945 (76 y.o. [name] Primary Care Provider: [name]  Other Clinician: Referring Provider: Treating Provider/Extender: Theodosia Quay in Treatment: 63 Constitutional respirations regular, non-labored and within target range for patient.. Cardiovascular 2+ dorsalis pedis/posterior tibialis pulses. Psychiatric pleasant and cooperative. Notes Right foot: He has an eschar to the dorsal first and fourth toes. No increased warmth or erythema to the fourth toe. Under the fourth toe scab there is scant white drainage. Foot and leg are warm. Electronic Signature(s) Signed: 10/17/2021 1:46:05 PM By: Kalman Shan DO Entered By: Kalman Shan on 10/17/2021 13:25:48 -------------------------------------------------------------------------------- Physician Orders Details Patient Name: Date of Service: [name]. 10/17/2021 12:30 PM Medical Record Number: [id number] Patient Account Number: [id number] Date of Birth/Sex: Treating RN: 05/27/1946 (76 y.o. [name] Primary Care Provider: [name] Other Clinician: Referring Provider: Treating  Provider/Extender: Theodosia Quay in Treatment: 60 Verbal / Phone Orders: No Diagnosis Coding ICD-10 Coding Code Description L97.528 Non-pressure chronic ulcer of other part of left foot with other specified severity L97.514 Non-pressure chronic ulcer of other part of right foot with necrosis of bone E11.51 Type 2 diabetes mellitus with diabetic peripheral angiopathy without gangrene I73.9 Peripheral vascular disease, unspecified Follow-up Appointments ppointment in: - 2 months w/ Dr. Heber [place] Return A Bathing/ Shower/ Hygiene May shower with protection but do not get wound dressing(s) wet. Edema Control - Lymphedema / SCD / Other Moisturize legs daily. Off-Loading Low air-loss mattress (Group 2) Turn and reposition every 2 hours Other: - CONTINUE TO USE BUNNY BOOTS FOR HEEL PROTECTION. UP IN WHEELCHAIR NO MORE THAT 2 HOUR INCREMENTS. Ensure to relieve pressure off buttock closed areas to prevent reopening. Home Health No change in wound care orders this week; continue Home Health for wound care. May utilize formulary equivalent dressing for wound treatment orders unless otherwise specified. Other Home Health Orders/Instructions: - [facility] Wound Treatment Wound #4 - [name] Great oe Wound Laterality: Right Cleanser: Wound Cleanser (Home Health) 1 x Per Day/30 Days Discharge Instructions: Cleanse the wound with wound cleanser prior to applying a clean dressing using gauze sponges, not tissue or cotton balls. Prim Dressing: Betadine 1 x Per Day/30 Days ary Discharge Instructions: apply betadine to wound. Secondary Dressing: Woven Gauze Sponges 2x2 in (Home Health) 1 x Per Day/30 Days Discharge Instructions: Apply over primary dressing as directed. Secured With: Child psychotherapist, Sterile 2x75 (in/in) (Home Health) 1 x Per Day/30 Days Discharge Instructions: Secure with stretch gauze as directed. Wound #6 - [name] Fourth oe Wound Laterality:  Right Cleanser: Wound Cleanser (Home Health) 1 x Per Day/30 Days Discharge Instructions: Cleanse the wound with wound cleanser prior to applying a clean dressing using gauze sponges, not tissue or cotton balls. Prim Dressing: Betadine 1 x Per Day/30 Days ary Discharge Instructions: apply betadine to wound. Secondary Dressing: Woven Gauze Sponges 2x2 in (Home Health) 1 x Per Day/30 Days Discharge Instructions: Apply over primary dressing as directed. Secured With: Child psychotherapist, Sterile 2x75 (in/in) (Home Health) 1 x Per Day/30 Days Discharge Instructions: Secure with stretch gauze as directed. Patient Medications llergies: Altace, cephalexin A Notifications Medication Indication Start End 10/17/2021 Bactrim DS DOSE 1 - oral 800 mg-160 mg tablet - 1 tablet oral BID x 7 days Electronic Signature(s) Signed: 10/17/2021 1:42:47 PM By: Kalman Shan DO Entered By: Kalman Shan on 10/17/2021 13:42:46 -------------------------------------------------------------------------------- Problem List Details Patient Name: Date of Service: [name]. 10/17/2021 12:30 PM Medical Record Number: [id number] Patient Account Number: [id number] Date of Birth/Sex: Treating RN: [birth year] (76 y.o. Jonette Eva,  [place] Primary Care Provider: [name] Other Clinician: Referring Provider: Treating Provider/Extender: Theodosia Quay in Treatment: 63 Active Problems ICD-10 Encounter Code Description Active Date MDM Diagnosis L97.528 Non-pressure chronic ulcer of other part of left foot with other specified 08/16/2020 No Yes severity L97.514 Non-pressure chronic ulcer of other part of right foot with necrosis of bone 10/18/2020 No Yes E11.51 Type 2 diabetes mellitus with diabetic peripheral angiopathy without gangrene 10/18/2020 No Yes I73.9 Peripheral vascular disease, unspecified 06/06/2021 No Yes Inactive Problems ICD-10 Code Description Active Date  Inactive Date L89.611 Pressure ulcer of right heel, stage 1 08/16/2020 08/16/2020 L89.312 Pressure ulcer of right buttock, stage 2 09/13/2020 09/13/2020 Resolved Problems ICD-10 Code Description Active Date Resolved Date L02.31 Cutaneous abscess of buttock 07/27/2020 07/27/2020 S31.829D Unspecified open wound of left buttock, subsequent encounter 07/27/2020 07/27/2020 Electronic Signature(s) Signed: 10/17/2021 1:46:05 PM By: Kalman Shan DO Entered By: Kalman Shan on 10/17/2021 13:23:13 -------------------------------------------------------------------------------- Progress Note Details Patient Name: Date of Service: [name]. 10/17/2021 12:30 PM Medical Record Number: [id number] Patient Account Number: [id number] Date of Birth/Sex: Treating RN: 05/13/1946 (76 y.o. [name] Primary Care Provider: [name] Other Clinician: Referring Provider: Treating Provider/Extender: Theodosia Quay in Treatment: 29 Subjective Chief Complaint Information obtained from Patient Bilateral toe wounds History of Present Illness (HPI) ADMISSION 07/27/2020 This is a 76 year old man who is accompanied by his wife (ex). Nevertheless she is his active caregiver. His most complicated features are remote CVA that left him with a left hemiparesis. He also has widely metastatic prostate cancer to bone which I think is contributed to increasing frailty this year and he is no longer ambulatory. He required hospitalization from 9/7 through 9/14 with respiratory failure seizures. During this time it was noted that he had an open area in the left buttock in close proximity to the gluteal cleft. His wife tells me that he has a history of perirectal abscesses. And she describes this area as starting as a painful swelling in late August certainly sounds like an abscess. When he is in the hospital he required bedside IandD's but did not go to the OR. Has CT scan of the  area showed soft tissue thickening in the perianal ulcer. At that point it was concerning for a fistula connection however a discrete abscess was not seen. Notable that he was hypoalbuminemic in the hospital but actually came out better with an albumin of 3.1 on 9/14 his wife says he is eating well. They are currently treating this with Santyl ointment and a backing wet-to-dry dressing. By description of his wife this is done quite nicely and there is certainly less debris over the wound surface. They already have a hospital bed with a level 3 pressure relief surface. The patient lies on his back when he is in bed especially at night although his wife is counseled him not to do this. They are starting sliding board transfers into his wheelchair I think with physical therapy. He has a Foley catheter in place Past medical history includes remote CVA, metastatic prostate cancer to large areas of his thoracic and lumbar spine and pelvis., Bilateral carotid artery stenosis, Parkinson's disease, type 2 diabetes, diastolic heart failure, CHF, hypertension and a recently discovered possible pancreatic mass in the head of the pancreas 08/16/2020 patient I admitted to the clinic 2-1/2 weeks ago. He had an abscess site on his left buttock that required an IandD while he was in the hospital in September. He has  been using Santyl backing wet-to-dry. He is cared for her aerobically at home by his wife 2 concerning areas on his feet. A stage I pressure injury on the right heel this does not have an open wound. He also has on the lateral aspect of the left fourth toe a dry circular eschared wound. 12/2; monthly follow-up. Is a very disabled man who had an abscess in his left buttock and required an IandD. We have been using Santyl to this area I changed to silver collagen last time he is here and this actually looks quite a bit better ooThe last time he was here his wife showed me an area on the left lateral fourth  toe I think a pressure injury with his fifth toe. Been using Santyl here as well but not making much improvement ooAs well she shows me he has an area on his right buttock which is very tiny pinpoint area but under illumination still obviously an open area. I wonder if this is more of a shear injury and transferring and a pressure injury per se 12/16; this is a very disabled man who has a left buttock area that required an IandD apparently an abscess. Since he has come here that he developed a area on the right buttock. His wife says that this is draining pus. Finally he has an area on the lateral part of the left fourth toe. We have been using silver collagen on the buttock and Iodoflex on the fourth toe 1/6; this is a disabled man we have been working on a left buttock area that was initially an IandD abscess injury. We have got this down to a small area in fact it appears to be epithelializing towards the base of the wound. When he was here last time he had a small draining area on the right buttock. I thought this might be a small cyst culture of the drainage was negative. I do not think this was an abscess at all While he has been here he has been developing areas on the toes on his left foot. He initially had one on the left fourth toe and then last time the right first toe. He is a type II diabetic. With he has a history of coronary artery disease. He complains of a lot of pain in the right foot 1/13; we have been working on areas on his left buttock and then on the right buttock. Both of these are healed today albeit the area on the left has some depth with skin is gone into a divot. Nevertheless I think this is a reasonable outcome. He arrived a week ago with ischemic looking wounds on his right fifth, right first and his left fourth toe. He went on to have arterial studies which we received today these showed an ABI that was noncompressible on both sides however the great toe on the right  had a TBI of 0.14 with a pressure of only 17. Monophasic waveforms on all the tibial vessels and including the popliteal on the left he had monophasic waveforms again noncompressible vessels and no recorded toe pressure. Clearly these are ischemic wounds. Fortunately he is not complaining of a lot of pain The patient has a complicated medical situation which includes widely metastatic prostate cancer to bone for which she is on palliative care he also has Parkinson's disease. I have a recent note from his neurologist at which time he asked whether they wish to de-escalate the Parkinson's regimen and the answer was  no. The patient has a follow-up appointment with Dr. Gwenlyn Found on 1/28. I believe he is the patient's cardiologist but this appointment was made because of the vascular studies I think predominantly through Dr. Kennon Holter office. 2/10; patient went back to see Dr. Gwenlyn Found. He did not feel given his comorbidities that he was a candidate for endovascular therapy. His wife seems to agree with that. He has ischemic wounds on his right toes particularly the right fifth toe fourth toe and first toe. He is not in a lot of pain unless these are manipulated 12/20/2020 on evaluation today patient appears to be doing well with regard to his feet all things considered. There does not appear to be any obvious signs of infection here. Most of the wounds are actually for the most part eschar and dry. With that being said the patient is not actually a candidate for revascularization according to Dr. Gwenlyn Found who he has seen previous. Nonetheless I think that for the time being Betadine or something of that sort just keep things clean and dry will be a good way to go. 3/24; patient has 3 areas on the dorsal right toes first fourth and fifth that are necrotic probably ischemic. He was not felt to be a candidate for revascularization therapy. He also has an area on the left fourth toe in a similar situation. His  significant other is concerned about possibility of an abscess on his buttock. He was put on antibiotics last time. I must say that when he was here before he seemed to have recurrent small abscesses but the purulence in this culture is negative I wondered about an epidermoid cyst although this area currently is not in the same spot 4/21; patient presents for his 1 month follow-up. He has chronic necrotic right toes. The affected toes include the first fourth and fifth digits. The third toe wound has closed. He uses Betadine on this daily. He has no complaints today. He denies fever chills, erythema to the wound sites. 5/19; patient presents for 1 month follow-up. He has had no issues in the past month with his bilateral toe wounds due to arterial insufficiency. He continues to use Betadine on these daily. He has no complaints today. He denies signs of infection. 6/30; patient presents for 6-week follow-up. He denies any issues. He has been using Betadine to the areas of necrosis on the right toes. He denies signs of infection. 8/25; patient presents for 76-month follow-up. He states that 1 week ago he developed some thick drainage from the fourth toe near the toenail. Patient has been putting antibiotic ointment to this area. He denies increased warmth or erythema to the fourth toe. He denies systemic signs of infection. He continues to use Betadine to the other necrotic areas. 9/1; patient states he has taken Bactrim for the past week. He noticed yellow drainage until 2 days ago when it stopped. He denies increased warmth or erythema to the area. He denies systemic signs of infection. 9/8; patient presents for follow-up. He has completed his second round of Bactrim. He has not noticed any more drainage. He overall feels well. He has no issues or complaints today. He denies signs of infection. 11/3; patient presents for follow-up. He has no issues or complaints today. He states that over the past 2  months he has had no issues with the toe wounds. He continues to use Betadine to them. He has not identified any signs of infection. He has no pain. 1/5; patient presents for  follow-up. He has no issues or complaints today. He reports stability to the wound sites. He continues to use Betadine. He has no pain. Patient History Information obtained from Patient. Family History Unknown History. Social History Former smoker - quit 12 years ago, Marital Status - Divorced, Alcohol Use - Never, Drug Use - No History, Caffeine Use - Rarely. Medical History Cardiovascular Patient has history of Congestive Heart Failure, Coronary Artery Disease, Hypertension Endocrine Patient has history of Type II Diabetes Denies history of Type I Diabetes Neurologic Patient has history of Seizure Disorder Oncologic Denies history of Received Chemotherapy, Received Radiation Medical A Surgical History Notes nd Cardiovascular CVA, Carotid stenosis Genitourinary CKD stage 3 Neurologic Parkinson's, left hemiparesis, CVA Oncologic prostate cancer with mets to bone, bladder cancer Objective Constitutional respirations regular, non-labored and within target range for patient.. Vitals Time Taken: 12:42 PM, Height: 64 in, Weight: 150 lbs, BMI: 25.7, Temperature: 98.2 F, Pulse: 64 bpm, Respiratory Rate: 16 breaths/min, Blood Pressure: 123/73 mmHg, Capillary Blood Glucose: 146 mg/dl. General Notes: glucose per pt report this AM Cardiovascular 2+ dorsalis pedis/posterior tibialis pulses. Psychiatric pleasant and cooperative. General Notes: Right foot: He has an eschar to the dorsal first and fourth toes. No increased warmth or erythema to the fourth toe. Under the fourth toe scab there is scant white drainage. Foot and leg are warm. Integumentary (Hair, Skin) Wound #4 status is Open. Original cause of wound was Not Known. The date acquired was: 10/11/2020. The wound has been in treatment 52 weeks. The  wound is located on the Right [name] Great. The wound measures 1cm length x 1cm width x 0.1cm depth; 0.785cm^2 area and 0.079cm^3 volume. There is Fat Layer oe (Subcutaneous Tissue) exposed. There is a medium amount of serosanguineous drainage noted. The wound margin is distinct with the outline attached to the wound base. There is no granulation within the wound bed. There is a large (67-100%) amount of necrotic tissue within the wound bed including Eschar. Wound #6 status is Open. Original cause of wound was Gradually Appeared. The date acquired was: 11/22/2020. The wound has been in treatment 47 weeks. The wound is located on the Right [name] Fourth. The wound measures 1cm length x 1.6cm width x 0.1cm depth; 1.257cm^2 area and 0.126cm^3 volume. There is oe Fat Layer (Subcutaneous Tissue) exposed. There is no tunneling or undermining noted. There is a small amount of purulent drainage noted. The wound margin is distinct with the outline attached to the wound base. There is no granulation within the wound bed. There is a large (67-100%) amount of necrotic tissue within the wound bed including Eschar. 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 The patient has not noticed drainage from the wound sites. Today on intake the nurse noted minimal drainage from the 4th toe wound. Since he is having some scant drainage from under the fourth toe scab and this cannot be easily removed I will treat with Bactrim. I recommended continuing Betadine dressings daily. He usually follows every 2 months and we can keep with this schedule. I advised that he call our office if there are any changes in symptoms and he can be seen  sooner. Plan Follow-up Appointments: Return Appointment in: - 2 months w/ Dr. Marigene Ehlers Shower/ Hygiene: May shower  with protection but do not get wound dressing(s) wet. Edema Control - Lymphedema / SCD / Other: Moisturize legs daily. Off-Loading: Low air-loss mattress (Group 2) Turn and reposition every 2 hours Other: - CONTINUE TO USE BUNNY BOOTS FOR HEEL PROTECTION. UP IN WHEELCHAIR NO MORE THAT 2 HOUR INCREMENTS. Ensure to relieve pressure off buttock closed areas to prevent reopening. Home Health: No change in wound care orders this week; continue Home Health for wound care. May utilize formulary equivalent dressing for wound treatment orders unless otherwise specified. Other Home Health Orders/Instructions: - [facility] The following medication(s) was prescribed: Bactrim DS oral 800 mg-160 mg tablet 1 1 tablet oral BID x 7 days starting 10/17/2021 WOUND #4: - [name] Great Wound Laterality: Right oe Cleanser: Wound Cleanser (Home Health) 1 x Per Day/30 Days Discharge Instructions: Cleanse the wound with wound cleanser prior to applying a clean dressing using gauze sponges, not tissue or cotton balls. Prim Dressing: Betadine 1 x Per Day/30 Days ary Discharge Instructions: apply betadine to wound. Secondary Dressing: Woven Gauze Sponges 2x2 in (Home Health) 1 x Per Day/30 Days Discharge Instructions: Apply over primary dressing as directed. Secured With: Child psychotherapist, Sterile 2x75 (in/in) (Home Health) 1 x Per Day/30 Days Discharge Instructions: Secure with stretch gauze as directed. WOUND #6: - [name] Fourth Wound Laterality: Right oe Cleanser: Wound Cleanser (Home Health) 1 x Per Day/30 Days Discharge Instructions: Cleanse the wound with wound cleanser prior to applying a clean dressing using gauze sponges, not tissue or cotton balls. Prim Dressing: Betadine 1 x Per Day/30 Days ary Discharge Instructions: apply betadine to wound. Secondary Dressing: Woven Gauze  Sponges 2x2 in (Home Health) 1 x Per Day/30 Days Discharge Instructions: Apply over primary dressing as directed. Secured With: Child psychotherapist, Sterile 2x75 (in/in) (Home Health) 1 x Per Day/30 Days Discharge Instructions: Secure with stretch gauze as directed. 1. Betadine daily 2. Bactrim Electronic Signature(s) Signed: 10/17/2021 1:46:05 PM By: Kalman Shan DO Entered By: Kalman Shan on 10/17/2021 13:44:27 -------------------------------------------------------------------------------- HxROS Details Patient Name: Date of Service: [name]. 10/17/2021 12:30 PM Medical Record Number: [id number] Patient Account Number: [id number] Date of Birth/Sex: Treating RN: 1946-03-13 (76 y.o. [name] Primary Care Provider: [name] Other Clinician: Referring Provider: Treating Provider/Extender: Theodosia Quay in Treatment: 4 Information Obtained From Patient Cardiovascular Medical History: Positive for: Congestive Heart Failure; Coronary Artery Disease; Hypertension Past Medical History Notes: CVA, Carotid stenosis Endocrine Medical History: Positive for: Type II Diabetes Negative for: Type I Diabetes Time with diabetes: 5 years ago Treated with: Insulin Blood sugar tested every day: Yes Tested : once a day Genitourinary Medical History: Past Medical History Notes: CKD stage 3 Neurologic Medical History: Positive for: Seizure Disorder Past Medical History Notes: Parkinson's, left hemiparesis, CVA Oncologic Medical History: Negative for: Received Chemotherapy; Received Radiation Past Medical History Notes: prostate cancer with mets to bone, bladder cancer Immunizations Pneumococcal Vaccine: Received Pneumococcal Vaccination: Yes Received Pneumococcal Vaccination On or After 60th Birthday: No Implantable Devices None Family and Social History Unknown History: Yes; Former smoker - quit 12 years ago;  Marital Status - Divorced; Alcohol Use: Never; Drug Use: No History; Caffeine Use: Rarely; Financial Concerns: No; Food, Clothing or Shelter Needs: No; Support System Lacking: No; Transportation Concerns: No Electronic Signature(s) Signed: 10/17/2021 1:46:05 PM By: Kalman Shan DO Signed: 10/17/2021 5:48:30 PM By: Levan Hurst RN, BSN Entered By: Kalman Shan on 10/17/2021 13:24:33 -------------------------------------------------------------------------------- [facility] Details Patient Name: Date of Service: Luis Pence  [facility] [name]. 10/17/2021 Medical Record Number: [id number] Patient Account Number: [id number] Date of Birth/Sex: Treating RN: 10-13-1946 (76 y.o. [name] Primary Care Provider: [name] Other Clinician: Referring Provider: Treating Provider/Extender: Theodosia Quay in Treatment: 63 Diagnosis Coding ICD-10 Codes Code Description [phone] Non-pressure chronic ulcer of other part of left foot with other specified severity L97.514 Non-pressure chronic ulcer of other part of right foot with necrosis of bone E11.51 Type 2 diabetes mellitus with diabetic peripheral angiopathy without gangrene I73.9 Peripheral vascular disease, unspecified Facility Procedures CPT4 Code: 92330076 Description: 99214 - WOUND CARE VISIT-LEV 4 EST PT Modifier: Quantity: 1 Physician Procedures : CPT4 Code Description Modifier [id number] - WC PHYS LEVEL 3 - EST PT ICD-10 Diagnosis Description L97.514 Non-pressure chronic ulcer of other part of right foot with necrosis of bone E11.51 Type 2 diabetes mellitus with diabetic peripheral  angiopathy without gangrene I73.9 Peripheral vascular disease, unspecified Quantity: 1 Electronic Signature(s) Signed: 10/17/2021 5:48:30 PM By: Levan Hurst RN, BSN Signed: 10/18/2021 9:23:11 AM By: Kalman Shan DO Previous Signature: 10/17/2021 1:46:05 PM Version By: Kalman Shan DO Entered By: Levan Hurst on  10/17/2021 17:23:53

## 2021-10-22 DIAGNOSIS — R339 Retention of urine, unspecified: Secondary | ICD-10-CM | POA: Diagnosis not present

## 2021-10-22 DIAGNOSIS — S90411D Abrasion, right great toe, subsequent encounter: Secondary | ICD-10-CM | POA: Diagnosis not present

## 2021-10-22 DIAGNOSIS — N1832 Chronic kidney disease, stage 3b: Secondary | ICD-10-CM | POA: Diagnosis not present

## 2021-10-22 DIAGNOSIS — Z466 Encounter for fitting and adjustment of urinary device: Secondary | ICD-10-CM | POA: Diagnosis not present

## 2021-10-22 DIAGNOSIS — I130 Hypertensive heart and chronic kidney disease with heart failure and stage 1 through stage 4 chronic kidney disease, or unspecified chronic kidney disease: Secondary | ICD-10-CM | POA: Diagnosis not present

## 2021-10-22 DIAGNOSIS — I69354 Hemiplegia and hemiparesis following cerebral infarction affecting left non-dominant side: Secondary | ICD-10-CM | POA: Diagnosis not present

## 2021-10-22 DIAGNOSIS — L89892 Pressure ulcer of other site, stage 2: Secondary | ICD-10-CM | POA: Diagnosis not present

## 2021-10-22 DIAGNOSIS — Z794 Long term (current) use of insulin: Secondary | ICD-10-CM | POA: Diagnosis not present

## 2021-10-22 DIAGNOSIS — G20 Parkinson's disease: Secondary | ICD-10-CM | POA: Diagnosis not present

## 2021-10-22 DIAGNOSIS — R1310 Dysphagia, unspecified: Secondary | ICD-10-CM | POA: Diagnosis not present

## 2021-10-22 DIAGNOSIS — E1122 Type 2 diabetes mellitus with diabetic chronic kidney disease: Secondary | ICD-10-CM | POA: Diagnosis not present

## 2021-10-22 DIAGNOSIS — I5032 Chronic diastolic (congestive) heart failure: Secondary | ICD-10-CM | POA: Diagnosis not present

## 2021-10-24 DIAGNOSIS — C672 Malignant neoplasm of lateral wall of bladder: Secondary | ICD-10-CM | POA: Diagnosis not present

## 2021-10-24 DIAGNOSIS — Z515 Encounter for palliative care: Secondary | ICD-10-CM | POA: Diagnosis not present

## 2021-10-24 DIAGNOSIS — R338 Other retention of urine: Secondary | ICD-10-CM | POA: Diagnosis not present

## 2021-10-24 DIAGNOSIS — C7951 Secondary malignant neoplasm of bone: Secondary | ICD-10-CM | POA: Diagnosis not present

## 2021-10-29 DIAGNOSIS — I69354 Hemiplegia and hemiparesis following cerebral infarction affecting left non-dominant side: Secondary | ICD-10-CM | POA: Diagnosis not present

## 2021-10-29 DIAGNOSIS — L89892 Pressure ulcer of other site, stage 2: Secondary | ICD-10-CM | POA: Diagnosis not present

## 2021-10-29 DIAGNOSIS — I130 Hypertensive heart and chronic kidney disease with heart failure and stage 1 through stage 4 chronic kidney disease, or unspecified chronic kidney disease: Secondary | ICD-10-CM | POA: Diagnosis not present

## 2021-10-29 DIAGNOSIS — S90411D Abrasion, right great toe, subsequent encounter: Secondary | ICD-10-CM | POA: Diagnosis not present

## 2021-10-29 DIAGNOSIS — G20 Parkinson's disease: Secondary | ICD-10-CM | POA: Diagnosis not present

## 2021-10-29 DIAGNOSIS — I5032 Chronic diastolic (congestive) heart failure: Secondary | ICD-10-CM | POA: Diagnosis not present

## 2021-10-29 DIAGNOSIS — N1832 Chronic kidney disease, stage 3b: Secondary | ICD-10-CM | POA: Diagnosis not present

## 2021-10-29 DIAGNOSIS — Z466 Encounter for fitting and adjustment of urinary device: Secondary | ICD-10-CM | POA: Diagnosis not present

## 2021-10-29 DIAGNOSIS — R339 Retention of urine, unspecified: Secondary | ICD-10-CM | POA: Diagnosis not present

## 2021-10-29 DIAGNOSIS — E1122 Type 2 diabetes mellitus with diabetic chronic kidney disease: Secondary | ICD-10-CM | POA: Diagnosis not present

## 2021-10-31 ENCOUNTER — Telehealth: Payer: Self-pay | Admitting: Cardiovascular Disease

## 2021-10-31 DIAGNOSIS — C44329 Squamous cell carcinoma of skin of other parts of face: Secondary | ICD-10-CM | POA: Diagnosis not present

## 2021-10-31 MED ORDER — AMLODIPINE BESYLATE 5 MG PO TABS: 5.0000 mg | ORAL_TABLET | Freq: Every day | ORAL | 6 refills | Status: DC

## 2021-10-31 NOTE — Telephone Encounter (Signed)
Returned call to wife informed with tha I will forward message to JB and we will call back when he responds. Pt will continue current medications and continue to take BP/HR and call back with abnormal readings.

## 2021-10-31 NOTE — Telephone Encounter (Signed)
° °  Pt c/o BP issue: STAT if pt c/o blurred vision, one-sided weakness or slurred speech  1. What are your last 5 BP readings? 114/40, 88/48, 136/62  2. Are you having any other symptoms (ex. Dizziness, headache, blurred vision, passed out)? None   3. What is your BP issue? 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 They were advised to f/u with heart doctor. [name] then checked pt's BP at home using her BP cuff and pt's BP is at 136/62. She wanted to know if pt needs to held his BP meds.

## 2021-10-31 NOTE — Telephone Encounter (Signed)
Pt informed of providers recommendations. Pt verbalized understanding. No further questions . Med list updated, new rx sent to pharmacy, pt will call pharmacy for refill.

## 2021-11-05 ENCOUNTER — Telehealth: Payer: Self-pay | Admitting: Cardiovascular Disease

## 2021-11-05 DIAGNOSIS — I5032 Chronic diastolic (congestive) heart failure: Secondary | ICD-10-CM | POA: Diagnosis not present

## 2021-11-05 DIAGNOSIS — L89892 Pressure ulcer of other site, stage 2: Secondary | ICD-10-CM | POA: Diagnosis not present

## 2021-11-05 DIAGNOSIS — S90411D Abrasion, right great toe, subsequent encounter: Secondary | ICD-10-CM | POA: Diagnosis not present

## 2021-11-05 DIAGNOSIS — R339 Retention of urine, unspecified: Secondary | ICD-10-CM | POA: Diagnosis not present

## 2021-11-05 DIAGNOSIS — N1832 Chronic kidney disease, stage 3b: Secondary | ICD-10-CM | POA: Diagnosis not present

## 2021-11-05 DIAGNOSIS — I130 Hypertensive heart and chronic kidney disease with heart failure and stage 1 through stage 4 chronic kidney disease, or unspecified chronic kidney disease: Secondary | ICD-10-CM | POA: Diagnosis not present

## 2021-11-05 DIAGNOSIS — Z466 Encounter for fitting and adjustment of urinary device: Secondary | ICD-10-CM | POA: Diagnosis not present

## 2021-11-05 DIAGNOSIS — G20 Parkinson's disease: Secondary | ICD-10-CM | POA: Diagnosis not present

## 2021-11-05 DIAGNOSIS — I69354 Hemiplegia and hemiparesis following cerebral infarction affecting left non-dominant side: Secondary | ICD-10-CM | POA: Diagnosis not present

## 2021-11-05 DIAGNOSIS — E1122 Type 2 diabetes mellitus with diabetic chronic kidney disease: Secondary | ICD-10-CM | POA: Diagnosis not present

## 2021-11-05 NOTE — Telephone Encounter (Signed)
°  Pt c/o BP issue: STAT if pt c/o blurred vision, one-sided weakness or slurred speech  1. What are your last 5 BP readings? 156/60  2. Are you having any other symptoms (ex. Dizziness, headache, blurred vision, passed out)?   3. What is your BP issue?  Candace with [facility] home [facility] called, she went to visit pt for his regular routine and pt's BP is at 156/60, Dr. Gwenlyn Found decreased his amlodipine to take half a tablet a day, the pt's wife added she's been getting elevated BP reading for the last 2 days as well, and they said that decreasing his BP might be causing it.

## 2021-11-05 NOTE — Telephone Encounter (Signed)
Spoke with patient who reports his BP has been running high since he changed his amlodipine dose. 156/60. He wants to know if he should be back on his original amlodipine dose. Please advise.

## 2021-11-06 NOTE — Telephone Encounter (Signed)
Spoke with pt wife, aware of dr berry's recommendations.   

## 2021-11-19 DIAGNOSIS — I130 Hypertensive heart and chronic kidney disease with heart failure and stage 1 through stage 4 chronic kidney disease, or unspecified chronic kidney disease: Secondary | ICD-10-CM | POA: Diagnosis not present

## 2021-11-19 DIAGNOSIS — Z466 Encounter for fitting and adjustment of urinary device: Secondary | ICD-10-CM | POA: Diagnosis not present

## 2021-11-19 DIAGNOSIS — N1832 Chronic kidney disease, stage 3b: Secondary | ICD-10-CM | POA: Diagnosis not present

## 2021-11-19 DIAGNOSIS — I69354 Hemiplegia and hemiparesis following cerebral infarction affecting left non-dominant side: Secondary | ICD-10-CM | POA: Diagnosis not present

## 2021-11-19 DIAGNOSIS — E1122 Type 2 diabetes mellitus with diabetic chronic kidney disease: Secondary | ICD-10-CM | POA: Diagnosis not present

## 2021-11-19 DIAGNOSIS — L89892 Pressure ulcer of other site, stage 2: Secondary | ICD-10-CM | POA: Diagnosis not present

## 2021-11-19 DIAGNOSIS — S90411D Abrasion, right great toe, subsequent encounter: Secondary | ICD-10-CM | POA: Diagnosis not present

## 2021-11-19 DIAGNOSIS — G20 Parkinson's disease: Secondary | ICD-10-CM | POA: Diagnosis not present

## 2021-11-19 DIAGNOSIS — R339 Retention of urine, unspecified: Secondary | ICD-10-CM | POA: Diagnosis not present

## 2021-11-19 DIAGNOSIS — I5032 Chronic diastolic (congestive) heart failure: Secondary | ICD-10-CM | POA: Diagnosis not present

## 2021-11-22 DIAGNOSIS — R339 Retention of urine, unspecified: Secondary | ICD-10-CM | POA: Diagnosis not present

## 2021-11-29 ENCOUNTER — Telehealth: Payer: Self-pay | Admitting: Family Medicine

## 2021-11-29 NOTE — Telephone Encounter (Signed)
[facility] Palliative visit 12-05-21 at 3:00

## 2021-12-02 DIAGNOSIS — E1122 Type 2 diabetes mellitus with diabetic chronic kidney disease: Secondary | ICD-10-CM | POA: Diagnosis not present

## 2021-12-02 DIAGNOSIS — G20 Parkinson's disease: Secondary | ICD-10-CM | POA: Diagnosis not present

## 2021-12-02 DIAGNOSIS — N1832 Chronic kidney disease, stage 3b: Secondary | ICD-10-CM | POA: Diagnosis not present

## 2021-12-02 DIAGNOSIS — R339 Retention of urine, unspecified: Secondary | ICD-10-CM | POA: Diagnosis not present

## 2021-12-02 DIAGNOSIS — L89892 Pressure ulcer of other site, stage 2: Secondary | ICD-10-CM | POA: Diagnosis not present

## 2021-12-02 DIAGNOSIS — I69354 Hemiplegia and hemiparesis following cerebral infarction affecting left non-dominant side: Secondary | ICD-10-CM | POA: Diagnosis not present

## 2021-12-02 DIAGNOSIS — I130 Hypertensive heart and chronic kidney disease with heart failure and stage 1 through stage 4 chronic kidney disease, or unspecified chronic kidney disease: Secondary | ICD-10-CM | POA: Diagnosis not present

## 2021-12-02 DIAGNOSIS — I5032 Chronic diastolic (congestive) heart failure: Secondary | ICD-10-CM | POA: Diagnosis not present

## 2021-12-02 DIAGNOSIS — Z466 Encounter for fitting and adjustment of urinary device: Secondary | ICD-10-CM | POA: Diagnosis not present

## 2021-12-02 DIAGNOSIS — S90411D Abrasion, right great toe, subsequent encounter: Secondary | ICD-10-CM | POA: Diagnosis not present

## 2021-12-05 ENCOUNTER — Other Ambulatory Visit: Payer: Self-pay

## 2021-12-05 ENCOUNTER — Encounter: Payer: Self-pay | Admitting: Family Medicine

## 2021-12-05 ENCOUNTER — Other Ambulatory Visit: Payer: Medicare Other | Admitting: Family Medicine

## 2021-12-05 ENCOUNTER — Other Ambulatory Visit: Payer: Self-pay | Admitting: Diagnostic Neuroimaging

## 2021-12-05 VITALS — BP 112/48 | HR 55 | Temp 97.4°F | Resp 20

## 2021-12-05 DIAGNOSIS — C61 Malignant neoplasm of prostate: Secondary | ICD-10-CM

## 2021-12-05 DIAGNOSIS — G20 Parkinson's disease: Secondary | ICD-10-CM

## 2021-12-05 DIAGNOSIS — L89899 Pressure ulcer of other site, unspecified stage: Secondary | ICD-10-CM | POA: Diagnosis not present

## 2021-12-05 DIAGNOSIS — Z515 Encounter for palliative care: Secondary | ICD-10-CM | POA: Diagnosis not present

## 2021-12-05 DIAGNOSIS — C7951 Secondary malignant neoplasm of bone: Secondary | ICD-10-CM | POA: Diagnosis not present

## 2021-12-05 NOTE — Progress Notes (Signed)
[facility] Consult Note Telephone: [phone]  Fax: [phone]    Date of encounter: 12/05/21 3:18 PM PATIENT NAME: GUSTABO GORDILLO [place] [id number]   [phone] (home)  DOB: Mar 22, 1946 MRN: [id number] PRIMARY CARE PROVIDER:    [name], MD,  [place] [address] [phone]  REFERRING PROVIDER:   [name], MD [address] [place],  [place] [address] [phone]  RESPONSIBLE PARTY:    Contact Information     Name Relation Home Work [place] K Relative [phone]  [phone]   [facility] [place] Daughter   [phone]   Marin, Milley [phone]     roey, coopman   [phone]        I met face to face with patient and ex-wife [name] in their home. Palliative Care was asked to follow this patient by consultation request of  [name], MD to address advance care planning and complex medical decision making. This is a follow up visit.                                   ASSESSMENT, SYMPTOM MANAGEMENT AND PLAN / RECOMMENDATIONS:   Palliative Care Encounter Has OxyCodone for pain prn but not using presently. Continue home Christmas for monthly foley catheter Changes Monitor for signs/symptoms of dysphagia with intake, eat sitting up for 30 minutes daily. Currently doubt eligibility for Hospice at present but if infection or decline, especially if intake poor, will likely meet Hospice criteria. Discussed same with primary caregiver, ex wife.  2.    Prostate Cancer Metastatic to Bone Failed Nubeqa therapy No pain issues at present. Will need to follow up for goals of care as pt progresses.  3.    Parkinsons Continues on Sinemet for rigidity and Dantrolene for spasticity from prior CVA with left side hemiparesis. Wet vocal quality and cough, question possible aspiration.  Encouraged continued monitoring and checking for retained residue after eating.  4.    Pressure injury to toes of right foot, unstageable Being seen at wound care center with next visit on 12/12/21. Question if pt candidate for hyperbaric wound treatment to improve healing given circulatory compromise. No pain in right foot, given depth to almost bone, would recommend foot xray to check for osteomyelitis. Keep blood sugars between 90-150 fasting, if starting to elevate will need to evaluate for infection. Continue current wound therapy, notify Palliative Care NP if blood sugars increase, noted drainage, blackened tissues or increase in pain of foot.  Advance Care Planning/Goals of Care:  CODE STATUS: Full Code  Antibiotics and IV fluids if indicated Feeding tube for a defined trial period    Follow up Palliative Care Visit: Palliative care will continue to follow for complex medical decision making, advance care planning, and clarification of goals. Return 4 weeks or prn.   This visit was coded based on medical decision making (MDM).  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  Had to stop 2nd generation anti-androgen receptor therapy-Nubeqa due to persistent nausea, vomiting and diarrhea at end of January.  HISTORY OF PRESENT ILLNESS:  RAM HAUGAN is a 76 y.o. year old male with castration resistant prostate cancer. Given recent adverse reaction to Nubeqa with persistent nausea, vomiting and diarrhea, pt is just beginning  to improv  Denies pain, appetite is fair and improving.  No falls, SOB.  Taking medication for nausea.  Famotidine 40 mg nightly and on Doxycycline. Needs help with all ADLs. Home health changes catheter monthly-[facility] .  Artery is totally occluded from the thigh to the knee. Provider mentioned checking for osteomyelitis at next wound care on 12/12/21.  Changed from Bactrim to  Doxycycline.  FSBS 109-134 Stopped Nubeqa end of January due to persistent nausea, vomiting and diarrhea.  Has Thorax, spine and pelvic mets.  Haven't been able to weigh as he does not stand and is bedbound.    History obtained from review of EMR, discussion with primary team, and interview with family, facility staff/caregiver and/or Mr. Mussa.  I reviewed available labs, medications, imaging, studies and related documents from the EMR.  Records reviewed and summarized above.   ROS  General: NAD EYES: denies vision changes ENMT: denies dysphagia Cardiovascular: denies chest pain, denies DOE Pulmonary: denies cough, denies increased SOB Abdomen: endorses fair appetite but improving, denies constipation, endorses incontinence of bowel GU: has foley catheter MSK:  endorses increased weakness, no falls reported Skin: denies rashes or wounds Neurological: denies pain, denies insomnia Psych: Endorses positive mood Heme/lymph/immuno: denies bruises, abnormal bleeding  Physical Exam: Current and past weights: Last known weight 160 lbs as of 05/08/21 Constitutional: NAD General: frail appearing, thin male seen lying in bed EYES: anicteric sclera, lids intact, no discharge  ENMT: intact hearing, oral mucous membranes moist, dentition intact CV: S1S2, RRR, no LE edema Pulmonary: CTAB, no increased work of breathing, occasional cough and wet vocal quality to voice, room air Abdomen: normo-active BS + 4 quadrants, soft and non tender, no ascites GU: deferred MSK: no sarcopenia, moves all extremities Skin: warm and dry, shallow scabbed brown ulcer on 5th toe, depth of great  toe almost to bone with surrounding scab, no drainage-has been painted with betadine has brownish-orange discoloration Neuro:  noted generalized weakness,  no cognitive impairment Psych: non-anxious affect, A and O x 3 Hem/lymph/immuno: no widespread bruising   Thank you for the opportunity to participate in the care  of Mr. Zimmerle.  The palliative care team will continue to follow. Please call our office at [phone] if we can be of additional assistance.   Marijo Conception, FNP   COVID-19 PATIENT SCREENING TOOL Asked and negative response unless otherwise noted:   Have you had symptoms of covid, tested positive or been in contact with someone with symptoms/positive test in the past 5-10 days?  No

## 2021-12-07 ENCOUNTER — Other Ambulatory Visit: Payer: Self-pay | Admitting: Diagnostic Neuroimaging

## 2021-12-09 ENCOUNTER — Telehealth: Payer: Self-pay | Admitting: Cardiovascular Disease

## 2021-12-09 NOTE — Telephone Encounter (Signed)
The patient's partner called in with concerns about the patient's blood pressure getting too low. She stated that he had just been taken off of a cancer medication because he had been having vomiting and diarrhea for 6 weeks. She stated that he is now starting to feel better and has started eating again. He is under palliative care.  Today: 114/5,  71, morning after medications 123/58,  78 after medications 112/48 this afternoon  Current Medications: Amlodipine 5 mg daily, morning Metoprolol Tartrate 25 mg once daily (?), bedtime Hydralazine 25 mg tid Furosemide 20 mg once daily  She has been advised that once the patient gets over the side effects from the medication and starts eating better that his blood pressure may go up. She has been advised to keep a log of the pressures and heart rates. His palliative care team also monitors this.

## 2021-12-09 NOTE — Telephone Encounter (Signed)
Pt c/o BP issue: STAT if pt c/o blurred vision, one-sided weakness or slurred speech  1. What are your last 5 BP readings? 114/51, 123/58/ 112/48  2. Are you having any other symptoms (ex. Dizziness, headache, blurred vision, passed out)? no  3. What is your BP issue? Blood pressure is low      now that pt is no longer on cancer medicine does there need to be any change to other medications due to pt taking a lower dose of amlodipine and crestor. please advise

## 2021-12-10 ENCOUNTER — Other Ambulatory Visit: Payer: Self-pay | Admitting: Physical Medicine & Rehabilitation

## 2021-12-10 DIAGNOSIS — N183 Chronic kidney disease, stage 3 unspecified: Secondary | ICD-10-CM | POA: Diagnosis not present

## 2021-12-10 DIAGNOSIS — I5032 Chronic diastolic (congestive) heart failure: Secondary | ICD-10-CM | POA: Diagnosis not present

## 2021-12-10 DIAGNOSIS — I10 Essential (primary) hypertension: Secondary | ICD-10-CM | POA: Diagnosis not present

## 2021-12-10 IMAGING — DX DG CHEST 1V PORT
1 series · 1 of 1 positions shown · non-contrast
Comparison: Radiograph 06/11/2020, CT 06/06/2020

CLINICAL DATA: Central line placement

EXAM:
PORTABLE CHEST 1 VIEW

[chest ap]
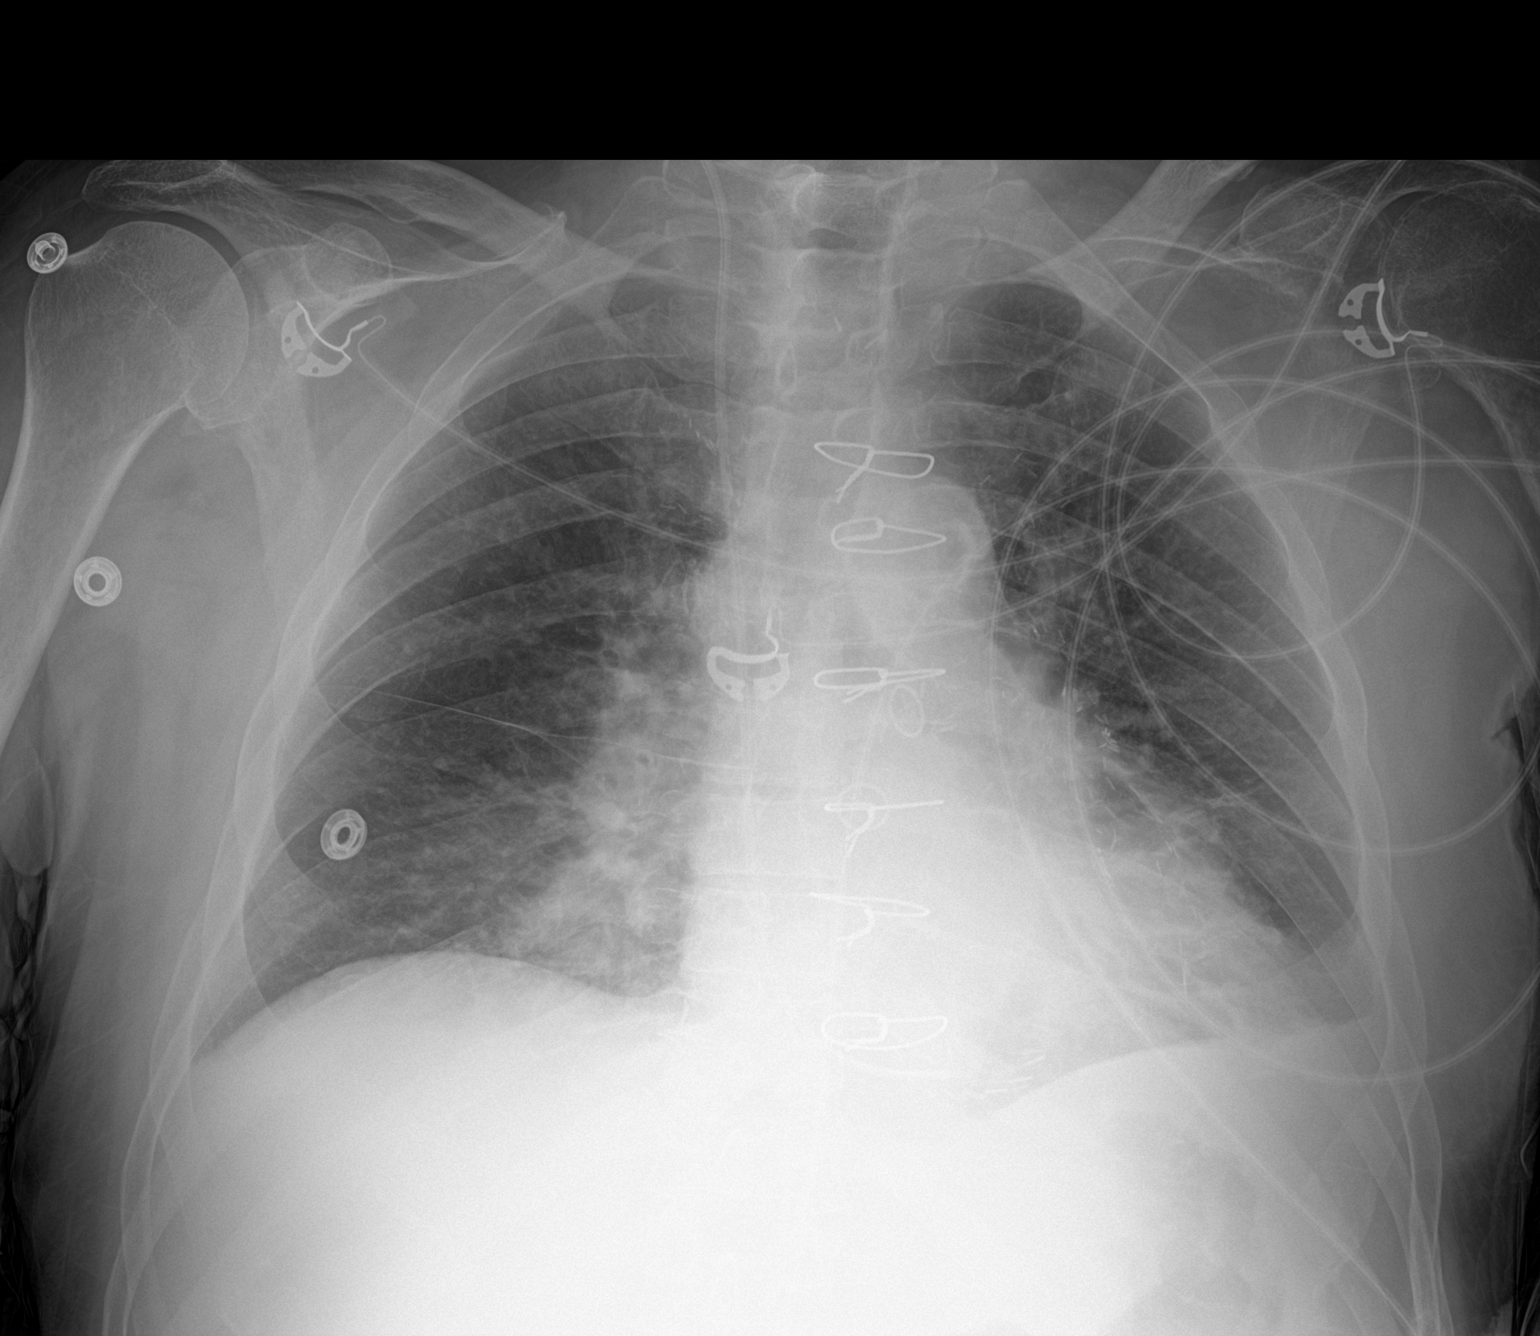

[1 of 1 positions shown; findings below may reference images not displayed]

FINDINGS: Right IJ catheter tip terminates at the level of the right atrium.
Prior sternotomy and features of CABG. Stable cardiomediastinal
contours. Atelectatic changes and vascular crowding are similar to
comparison. Some abundant mediastinal and subpleural fat is similar
to comparison CT. No convincing pneumothorax or effusion. No new
focal consolidative opacity or features of edema. No acute osseous
or soft tissue abnormality. Telemetry leads overlie the chest.
Surgical clips at the base of the neck and evidence of left carotid
stenting.
IMPRESSION: 1. Right IJ catheter tip terminates at the level of the right
atrium.
2. No convincing pneumothorax.
3. Stable bilateral atelectasis and vascular crowding.

## 2021-12-10 MED ORDER — HYDRALAZINE HCL 25 MG PO TABS: 25.0000 mg | ORAL_TABLET | Freq: Two times a day (BID) | ORAL | 1 refills | Status: AC

## 2021-12-10 MED ORDER — AMLODIPINE BESYLATE 2.5 MG PO TABS: 2.5000 mg | ORAL_TABLET | Freq: Every day | ORAL | 1 refills | Status: AC

## 2021-12-10 NOTE — Telephone Encounter (Signed)
Patient's wife has been made aware. She will continue to monitor the blood pressures.  Lorretta Harp, MD  You 11 hours ago (6:15 AM)   Cut amlodipine to 2.5 mg and hydralazine from TID--> BID and follow BPs. We can start there.   JJB

## 2021-12-12 ENCOUNTER — Encounter (HOSPITAL_BASED_OUTPATIENT_CLINIC_OR_DEPARTMENT_OTHER): Payer: Medicare Other | Attending: Internal Medicine | Admitting: Internal Medicine

## 2021-12-12 ENCOUNTER — Other Ambulatory Visit: Payer: Self-pay

## 2021-12-12 DIAGNOSIS — G20 Parkinson's disease: Secondary | ICD-10-CM | POA: Diagnosis not present

## 2021-12-12 DIAGNOSIS — C7951 Secondary malignant neoplasm of bone: Secondary | ICD-10-CM | POA: Diagnosis not present

## 2021-12-12 DIAGNOSIS — I739 Peripheral vascular disease, unspecified: Secondary | ICD-10-CM | POA: Diagnosis not present

## 2021-12-12 DIAGNOSIS — L97528 Non-pressure chronic ulcer of other part of left foot with other specified severity: Secondary | ICD-10-CM | POA: Diagnosis not present

## 2021-12-12 DIAGNOSIS — Z8673 Personal history of transient ischemic attack (TIA), and cerebral infarction without residual deficits: Secondary | ICD-10-CM | POA: Diagnosis not present

## 2021-12-12 DIAGNOSIS — E1151 Type 2 diabetes mellitus with diabetic peripheral angiopathy without gangrene: Secondary | ICD-10-CM | POA: Insufficient documentation

## 2021-12-12 DIAGNOSIS — C61 Malignant neoplasm of prostate: Secondary | ICD-10-CM | POA: Diagnosis not present

## 2021-12-12 DIAGNOSIS — E11621 Type 2 diabetes mellitus with foot ulcer: Secondary | ICD-10-CM | POA: Insufficient documentation

## 2021-12-12 DIAGNOSIS — I2510 Atherosclerotic heart disease of native coronary artery without angina pectoris: Secondary | ICD-10-CM | POA: Insufficient documentation

## 2021-12-12 DIAGNOSIS — L97514 Non-pressure chronic ulcer of other part of right foot with necrosis of bone: Secondary | ICD-10-CM | POA: Insufficient documentation

## 2021-12-12 DIAGNOSIS — L0231 Cutaneous abscess of buttock: Secondary | ICD-10-CM | POA: Diagnosis not present

## 2021-12-12 MED ORDER — EZETIMIBE 10 MG PO TABS: 10.0000 mg | ORAL_TABLET | Freq: Every day | ORAL | 3 refills | Status: AC

## 2021-12-12 NOTE — Progress Notes (Addendum)
[name], [name] ([id number]) . Visit Report for 12/12/2021 Arrival Information Details Patient Name: Date of Service: [name], [name]. 12/12/2021 11:00 A M Medical Record Number: [id number] Patient Account Number: [id number] Date of Birth/Sex: Treating RN: [birth year] (76 y.o. [name] Primary Care [name]: [name] Other Clinician: Referring [name]: Treating [name]/Extender: [name] in Treatment: 40 Visit Information History Since Last Visit Added or deleted any medications: Yes Patient Arrived: Wheel Chair Any new allergies or adverse reactions: No Arrival Time: 11:08 Had a fall or experienced change in No Accompanied By: wife, son activities of daily living that may affect Transfer Assistance: None risk of falls: Patient Identification Verified: Yes Signs or symptoms of abuse/neglect since last visito No Secondary Verification Process Completed: Yes Hospitalized since last visit: No Patient Requires Transmission-Based Precautions: No Implantable device outside of the clinic excluding No Patient Has Alerts: Yes cellular tissue based products placed in the center Patient Alerts: Patient on Blood Thinner since last visit: Has Dressing in Place as Prescribed: Yes Pain Present Now: No Electronic Signature(s) Signed: 12/12/2021 5:00:00 PM By: [name] Entered By: [name] on 12/12/2021 11:08:49 -------------------------------------------------------------------------------- Clinic Level of Care Assessment Details Patient Name: Date of Service: [name] [facility] [name]. 12/12/2021 11:00 A M Medical Record Number: [id number] Patient Account Number: [id number] Date of Birth/Sex: Treating RN: May 29, 1946 (76 y.o. [name] Primary Care Logan [place]: [name] Other Clinician: Referring Shenee Wignall: Treating [name]/Extender: [name] in Treatment: 32 Clinic Level of Care Assessment  Items TOOL 4 Quantity Score X- 1 0 Use when only an EandM is performed on FOLLOW-UP visit ASSESSMENTS - Nursing Assessment / Reassessment X- 1 10 Reassessment of Co-morbidities (includes updates in patient status) X- 1 5 Reassessment of Adherence to Treatment Plan ASSESSMENTS - Wound and Skin A ssessment / Reassessment []  - 0 Simple Wound Assessment / Reassessment - one wound X- 3 5 Complex Wound Assessment / Reassessment - multiple wounds []  - 0 Dermatologic / Skin Assessment (not related to wound area) ASSESSMENTS - Focused Assessment []  - 0 Circumferential Edema Measurements - multi extremities []  - 0 Nutritional Assessment / Counseling / Intervention []  - 0 Lower Extremity Assessment (monofilament, tuning fork, pulses) []  - 0 Peripheral Arterial Disease Assessment (using hand held doppler) ASSESSMENTS - Ostomy and/or Continence Assessment and Care []  - 0 Incontinence Assessment and Management []  - 0 Ostomy Care Assessment and Management (repouching, etc.) PROCESS - Coordination of Care []  - 0 Simple Patient / Family Education for ongoing care X- 1 20 Complex (extensive) Patient / Family Education for ongoing care []  - 0 Staff obtains Programmer, systems, Records, [name] Results / Process Orders est X- 1 10 Staff telephones HHA, Nursing Homes / Clarify orders / etc []  - 0 Routine Transfer to another Facility (non-emergent condition) []  - 0 Routine Hospital Admission (non-emergent condition) []  - 0 New Admissions / Biomedical engineer / Ordering NPWT Apligraf, etc. , []  - 0 Emergency Hospital Admission (emergent condition) []  - 0 Simple Discharge Coordination []  - 0 Complex (extensive) Discharge Coordination PROCESS - Special Needs []  - 0 Pediatric / Minor Patient Management []  - 0 Isolation Patient Management []  - 0 Hearing / Language / Visual special needs []  - 0 Assessment of Community assistance (transportation, D/C planning, etc.) []  - 0 Additional  assistance / Altered mentation []  - 0 Support Surface(s) Assessment (bed, cushion, seat, etc.) INTERVENTIONS - Wound Cleansing / Measurement []  - 0 Simple Wound Cleansing - one wound X- 3 5  Complex Wound Cleansing - multiple wounds X- 1 5 Wound Imaging (photographs - any number of wounds) $RemoveBe'[]'haHbAoavZ$  - 0 Wound Tracing (instead of photographs) $RemoveBeforeD'[]'BcieFTkQxbIxNI$  - 0 Simple Wound Measurement - one wound X- 3 5 Complex Wound Measurement - multiple wounds INTERVENTIONS - Wound Dressings X - Small Wound Dressing one or multiple wounds 3 10 $Re'[]'vKL$  - 0 Medium Wound Dressing one or multiple wounds $RemoveBeforeD'[]'EKDpiTorpyonDI$  - 0 Large Wound Dressing one or multiple wounds $RemoveBeforeD'[]'vUynTAlFXfwrMZ$  - 0 Application of Medications - topical $RemoveB'[]'JlroMocH$  - 0 Application of Medications - injection INTERVENTIONS - Miscellaneous $RemoveBeforeD'[]'aiWdFBZpjCfAgD$  - 0 External ear exam $Remove'[]'SnQkuzA$  - 0 Specimen Collection (cultures, biopsies, blood, body fluids, etc.) $RemoveBefor'[]'UeGxJVAXQTHU$  - 0 Specimen(s) / Culture(s) sent or taken to Lab for analysis $RemoveBefo'[]'RzoJbaORiGv$  - 0 Patient Transfer (multiple staff / Civil Service fast streamer / Similar devices) $RemoveBeforeDE'[]'lnhODUuFaPnHZgz$  - 0 Simple Staple / Suture removal (25 or less) $Remove'[]'JQoEhYz$  - 0 Complex Staple / Suture removal (26 or more) $Remove'[]'lIYgCXn$  - 0 Hypo / Hyperglycemic Management (close monitor of Blood Glucose) $RemoveBefore'[]'IDMLrqipujsPP$  - 0 Ankle / Brachial Index (ABI) - do not check if billed separately X- 1 5 Vital Signs Has the patient been seen at the hospital within the last three years: Yes Total Score: 130 Level Of Care: New/Established - Level 4 Electronic Signature(s) Signed: 12/12/2021 5:00:00 PM By: [name] Entered By: [name] on 12/12/2021 12:04:16 -------------------------------------------------------------------------------- Encounter Discharge Information Details Patient Name: Date of Service: [name] [name]. 12/12/2021 11:00 A M Medical Record Number: [id number] Patient Account Number: [id number] Date of Birth/Sex: Treating RN: 1946-01-06 (76 y.o. [name] Primary Care Tommaso Cavitt: [name] Other Clinician: Referring  Amoni Morales: Treating Eilene Voigt/Extender: [name] in Treatment: 22 Encounter Discharge Information Items Discharge Condition: Stable Ambulatory Status: Wheelchair Discharge Destination: Home Transportation: Private Auto Accompanied By: wife, son Schedule Follow-up Appointment: Yes Clinical Summary of Care: Provided on 12/12/2021 Form Type Recipient Paper Patient Patient Electronic Signature(s) Signed: 12/12/2021 5:00:00 PM By: [name] Entered By: [name] on 12/12/2021 12:05:10 -------------------------------------------------------------------------------- Lower Extremity Assessment Details Patient Name: Date of Service: [name] [name]. 12/12/2021 11:00 A M Medical Record Number: [id number] Patient Account Number: [id number] Date of Birth/Sex: Treating RN: 03-08-1946 (76 y.o. [name] Primary Care Wilfred Siverson: [name] Other Clinician: Referring Monty Spicher: Treating Jonus Coble/Extender: [name] in Treatment: 71 Edema Assessment Assessed: Shirlyn Goltz: No] Patrice Paradise: Yes] Edema: [Left: N] [Right: o] Calf Left: Right: Point of Measurement: 27 cm From Medial Instep 24.8 cm Ankle Left: Right: Point of Measurement: 9 cm From Medial Instep 17 cm Vascular Assessment Pulses: Dorsalis Pedis Palpable: [Right:Yes] Electronic Signature(s) Signed: 12/12/2021 5:00:00 PM By: [name] Entered By: [name] on 12/12/2021 11:15:42 -------------------------------------------------------------------------------- Multi Wound Chart Details Patient Name: Date of Service: [name] [name]. 12/12/2021 11:00 A M Medical Record Number: [id number] Patient Account Number: [id number] Date of Birth/Sex: Treating RN: April 18, 1946 (76 y.o. M) Primary Care Hani Campusano: [name] Other Clinician: Referring Sutton Hirsch: Treating Bree Heinzelman/Extender: [name] in Treatment: 10 Vital  Signs Height(in): 64 Capillary Blood Glucose(mg/dl): 124 Weight(lbs): 150 Pulse(bpm): 29 Body Mass Index(BMI): 25.7 Blood Pressure(mmHg): 109/66 Temperature(F): 98 Respiratory Rate(breaths/min): 20 Photos: Right [name] Great oe Right [name] Fourth oe Right [name] Fifth oe Wound Location: Not Known Gradually Appeared Gradually Appeared Wounding Event: Diabetic Wound/Ulcer of the Lower Diabetic Wound/Ulcer of the Lower Diabetic Wound/Ulcer of the Lower Primary Etiology: Extremity Extremity Extremity Congestive Heart Failure, Coronary Congestive Heart Failure, Coronary Congestive Heart Failure, Coronary Comorbid History: Artery Disease, Hypertension,  Type II Artery Disease, Hypertension, Type II Artery Disease, Hypertension, Type II Diabetes, Seizure Disorder Diabetes, Seizure Disorder Diabetes, Seizure Disorder 10/11/2020 11/22/2020 12/02/2021 Date Acquired: 60 57 0 Weeks of Treatment: Open Open Open Wound Status: No No No Wound Recurrence: 0.8x0.9x0 1x1.5x0.1 0.4x0.3x0.1 Measurements L x W x D (cm) 0.565 1.178 0.094 A (cm) : rea 0.057 0.118 0.009 Volume (cm) : 48.60% -150.10% N/A % Reduction in A rea: 48.20% -151.10% N/A % Reduction in Volume: Grade 2 Grade 2 Grade 1 Classification: Medium Medium Medium Exudate A mount: Serosanguineous Serosanguineous Serosanguineous Exudate Type: red, brown red, brown red, brown Exudate Color: Distinct, outline attached Distinct, outline attached Distinct, outline attached Wound Margin: Small (1-33%) None Present (0%) None Present (0%) Granulation A mount: Red N/A N/A Granulation Quality: Large (67-100%) Large (67-100%) Large (67-100%) Necrotic A mount: Eschar Eschar Eschar Necrotic Tissue: Fat Layer (Subcutaneous Tissue): Yes Fat Layer (Subcutaneous Tissue): Yes Fat Layer (Subcutaneous Tissue): Yes Exposed Structures: Fascia: No Fascia: No Fascia: No Tendon: No Tendon: No Tendon: No Muscle: No Muscle: No Muscle: No Joint:  No Joint: No Joint: No Bone: No Bone: No Bone: No Medium (34-66%) None None Epithelialization: Treatment Notes Wound #4 (Toe Great) Wound Laterality: Right Cleanser Wound Cleanser Discharge Instruction: Cleanse the wound with wound cleanser prior to applying a clean dressing using gauze sponges, not tissue or cotton balls. Peri-Wound Care Topical Primary Dressing Betadine Discharge Instruction: apply betadine to wound. Secondary Dressing Woven Gauze Sponges 2x2 in Discharge Instruction: Apply over primary dressing as directed. Secured With Conforming Stretch Gauze Bandage, Sterile 2x75 (in/in) Discharge Instruction: Secure with stretch gauze as directed. Compression Wrap Compression Stockings Add-Ons Wound #6 (Toe Fourth) Wound Laterality: Right Cleanser Wound Cleanser Discharge Instruction: Cleanse the wound with wound cleanser prior to applying a clean dressing using gauze sponges, not tissue or cotton balls. Peri-Wound Care Topical Primary Dressing Betadine Discharge Instruction: apply betadine to wound. Secondary Dressing Woven Gauze Sponges 2x2 in Discharge Instruction: Apply over primary dressing as directed. Secured With Conforming Stretch Gauze Bandage, Sterile 2x75 (in/in) Discharge Instruction: Secure with stretch gauze as directed. Compression Wrap Compression Stockings Add-Ons Wound #7 (Toe Fifth) Wound Laterality: Right Cleanser Wound Cleanser Discharge Instruction: Cleanse the wound with wound cleanser prior to applying a clean dressing using gauze sponges, not tissue or cotton balls. Peri-Wound Care Topical Primary Dressing Betadine Discharge Instruction: apply betadine to wound. Secondary Dressing Woven Gauze Sponges 2x2 in Discharge Instruction: Apply over primary dressing as directed. Secured With Conforming Stretch Gauze Bandage, Sterile 2x75 (in/in) Discharge Instruction: Secure with stretch gauze as directed. Compression  Wrap Compression Stockings Add-Ons Electronic Signature(s) Signed: 12/12/2021 1:03:22 PM By: Kalman Shan DO Entered By: Kalman Shan on 12/12/2021 12:54:59 -------------------------------------------------------------------------------- Multi-Disciplinary Care Plan Details Patient Name: Date of Service: [name] [name]. 12/12/2021 11:00 A M Medical Record Number: [id number] Patient Account Number: [id number] Date of Birth/Sex: Treating RN: Jan 25, 1946 (76 y.o. [name] Primary Care Gian Ybarra: [name] Other Clinician: Referring Lacora Folmer: Treating Angela Vazguez/Extender: [name] in Treatment: 72 [place] reviewed with physician Active Inactive Wound/Skin Impairment Nursing Diagnoses: Impaired tissue integrity Knowledge deficit related to ulceration/compromised skin integrity Goals: Patient/caregiver will verbalize understanding of skin care regimen Date Initiated: 07/27/2020 Target Resolution Date: 01/02/2022 Goal Status: Active Ulcer/skin breakdown will have a volume reduction of 30% by week 4 Date Initiated: 07/27/2020 Date Inactivated: 09/13/2020 Target Resolution Date: 08/24/2020 Goal Status: Met Interventions: Assess patient/caregiver ability to obtain necessary supplies Assess patient/caregiver ability to perform ulcer/skin care  regimen upon admission and as needed Assess ulceration(s) every visit Provide education on ulcer and skin care Treatment Activities: Skin care regimen initiated : 07/27/2020 Topical wound management initiated : 07/27/2020 Notes: 08/15/21: Wound care regimen continues. Dressings changed by [facility] and family. Electronic Signature(s) Signed: 12/12/2021 11:02:56 AM By: [name] Entered By: [name] on 12/12/2021 11:02:56 -------------------------------------------------------------------------------- Pain Assessment Details Patient Name: Date of Service: [name]  [name]. 12/12/2021 11:00 A M Medical Record Number: [id number] Patient Account Number: [id number] Date of Birth/Sex: Treating RN: 06/27/1946 (76 y.o. [name] Primary Care Island Dohmen: [name] Other Clinician: Referring Eufemio Strahm: Treating Pearse Shiffler/Extender: [name] in Treatment: 80 Active Problems Location of Pain Severity and Description of Pain Patient Has Paino No Site Locations Pain Management and Medication Current Pain Management: Electronic Signature(s) Signed: 12/12/2021 5:00:00 PM By: [name] Entered By: [name] on 12/12/2021 11:15:27 -------------------------------------------------------------------------------- Patient/Caregiver Education Details Patient Name: Date of Service: Julieta Gutting 3/2/2023andnbsp11:00 A M Medical Record Number: [id number] Patient Account Number: [id number] Date of Birth/Gender: Treating RN: 1945/12/23 (76 y.o. [name] Primary Care Physician: [name] Other Clinician: Referring Physician: Treating Physician/Extender: [name] in Treatment: 60 Education Assessment Education Provided To: Patient Education Topics Provided Wound/Skin Impairment: Methods: Explain/Verbal, Printed Responses: State content correctly [employer]) Signed: 12/12/2021 5:00:00 PM By: [name] Entered By: [name] on 12/12/2021 11:03:13 -------------------------------------------------------------------------------- Wound Assessment Details Patient Name: Date of Service: [name] [name]. 12/12/2021 11:00 A M Medical Record Number: [id number] Patient Account Number: [id number] Date of Birth/Sex: Treating RN: 06-Apr-1946 (76 y.o. [name], Lauren Primary Care Briceida Rasberry: [name] Other Clinician: Referring Jajaira Ruis: Treating Hilton Saephan/Extender: [name] in Treatment: 97 Wound Status Wound Number: 4  Primary Diabetic Wound/Ulcer of the Lower Extremity Etiology: Wound Location: Right [name] Great oe Wound Open Wounding Event: Not Known Status: Date Acquired: 10/11/2020 Comorbid Congestive Heart Failure, Coronary Artery Disease, Weeks Of Treatment: 60 History: Hypertension, Type II Diabetes, Seizure Disorder Clustered Wound: No Photos Wound Measurements Length: (cm) 0.8 Width: (cm) 0.9 Depth: (cm) 0 Area: (cm) 0.565 Volume: (cm) 0.057 % Reduction in Area: 48.6% % Reduction in Volume: 48.2% Epithelialization: Medium (34-66%) Tunneling: No Undermining: No Wound Description Classification: Grade 2 Wound Margin: Distinct, outline attached Exudate Amount: Medium Exudate Type: Serosanguineous Exudate Color: red, brown Foul Odor After Cleansing: No Slough/Fibrino No Wound Bed Granulation Amount: Small (1-33%) Exposed Structure Granulation Quality: Red Fascia Exposed: No Necrotic Amount: Large (67-100%) Fat Layer (Subcutaneous Tissue) Exposed: Yes Necrotic Quality: Eschar Tendon Exposed: No Muscle Exposed: No Joint Exposed: No Bone Exposed: No Treatment Notes Wound #4 (Toe Great) Wound Laterality: Right Cleanser Wound Cleanser Discharge Instruction: Cleanse the wound with wound cleanser prior to applying a clean dressing using gauze sponges, not tissue or cotton balls. Peri-Wound Care Topical Primary Dressing Betadine Discharge Instruction: apply betadine to wound. Secondary Dressing Woven Gauze Sponges 2x2 in Discharge Instruction: Apply over primary dressing as directed. Secured With Conforming Stretch Gauze Bandage, Sterile 2x75 (in/in) Discharge Instruction: Secure with stretch gauze as directed. Compression Wrap Compression Stockings Add-Ons Electronic Signature(s) Signed: 12/12/2021 4:17:36 PM By: Rhae Hammock RN Entered By: Rhae Hammock on 12/12/2021 11:19:24 -------------------------------------------------------------------------------- Wound  Assessment Details Patient Name: Date of Service: [name] [name]. 12/12/2021 11:00 A M Medical Record Number: [id number] Patient Account Number: [id number] Date of Birth/Sex: Treating RN: 1946-04-22 (76 y.o. [name], Lauren Primary Care Karel Turpen: [name] Other Clinician: Referring Francile Woolford: Treating Tadeo Besecker/Extender: Laurier Nancy,  Mayford Knife in Treatment: 71 Wound Status Wound Number: 6 Primary Diabetic Wound/Ulcer of the Lower Extremity Etiology: Wound Location: Right [name] Fourth oe Wound Open Wounding Event: Gradually Appeared Status: Date Acquired: 11/22/2020 Comorbid Congestive Heart Failure, Coronary Artery Disease, Weeks Of Treatment: 55 History: Hypertension, Type II Diabetes, Seizure Disorder Clustered Wound: No Photos Wound Measurements Length: (cm) 1 Width: (cm) 1.5 Depth: (cm) 0.1 Area: (cm) 1.178 Volume: (cm) 0.118 % Reduction in Area: -150.1% % Reduction in Volume: -151.1% Epithelialization: None Tunneling: No Undermining: No Wound Description Classification: Grade 2 Wound Margin: Distinct, outline attached Exudate Amount: Medium Exudate Type: Serosanguineous Exudate Color: red, brown Foul Odor After Cleansing: No Slough/Fibrino No Wound Bed Granulation Amount: None Present (0%) Exposed Structure Necrotic Amount: Large (67-100%) Fascia Exposed: No Necrotic Quality: Eschar Fat Layer (Subcutaneous Tissue) Exposed: Yes Tendon Exposed: No Muscle Exposed: No Joint Exposed: No Bone Exposed: No Treatment Notes Wound #6 (Toe Fourth) Wound Laterality: Right Cleanser Wound Cleanser Discharge Instruction: Cleanse the wound with wound cleanser prior to applying a clean dressing using gauze sponges, not tissue or cotton balls. Peri-Wound Care Topical Primary Dressing Betadine Discharge Instruction: apply betadine to wound. Secondary Dressing Woven Gauze Sponges 2x2 in Discharge Instruction: Apply over primary dressing as  directed. Secured With Conforming Stretch Gauze Bandage, Sterile 2x75 (in/in) Discharge Instruction: Secure with stretch gauze as directed. Compression Wrap Compression Stockings Add-Ons Electronic Signature(s) Signed: 12/12/2021 4:17:36 PM By: Rhae Hammock RN Entered By: Rhae Hammock on 12/12/2021 11:21:20 -------------------------------------------------------------------------------- Wound Assessment Details Patient Name: Date of Service: [name] [name]. 12/12/2021 11:00 A M Medical Record Number: [id number] Patient Account Number: [id number] Date of Birth/Sex: Treating RN: [birth year] (76 y.o. [name], Lauren Primary Care Omolara Carol: [name] Other Clinician: Referring Cj Edgell: Treating Ashaunte Standley/Extender: [name] in Treatment: 44 Wound Status Wound Number: 7 Primary Diabetic Wound/Ulcer of the Lower Extremity Etiology: Wound Location: Right [name] Fifth oe Wound Open Wounding Event: Gradually Appeared Status: Date Acquired: 12/02/2021 Comorbid Congestive Heart Failure, Coronary Artery Disease, Weeks Of Treatment: 0 History: Hypertension, Type II Diabetes, Seizure Disorder Clustered Wound: No Photos Wound Measurements Length: (cm) 0.4 Width: (cm) 0.3 Depth: (cm) 0.1 Area: (cm) 0.094 Volume: (cm) 0.009 % Reduction in Area: % Reduction in Volume: Epithelialization: None Tunneling: No Undermining: No Wound Description Classification: Grade 1 Wound Margin: Distinct, outline attached Exudate Amount: Medium Exudate Type: Serosanguineous Exudate Color: red, brown Foul Odor After Cleansing: No Slough/Fibrino No Wound Bed Granulation Amount: None Present (0%) Exposed Structure Necrotic Amount: Large (67-100%) Fascia Exposed: No Necrotic Quality: Eschar Fat Layer (Subcutaneous Tissue) Exposed: Yes Tendon Exposed: No Muscle Exposed: No Joint Exposed: No Bone Exposed: No Treatment Notes Wound #7 (Toe Fifth) Wound  Laterality: Right Cleanser Wound Cleanser Discharge Instruction: Cleanse the wound with wound cleanser prior to applying a clean dressing using gauze sponges, not tissue or cotton balls. Peri-Wound Care Topical Primary Dressing Betadine Discharge Instruction: apply betadine to wound. Secondary Dressing Woven Gauze Sponges 2x2 in Discharge Instruction: Apply over primary dressing as directed. Secured With Conforming Stretch Gauze Bandage, Sterile 2x75 (in/in) Discharge Instruction: Secure with stretch gauze as directed. Compression Wrap Compression Stockings Add-Ons Electronic Signature(s) Signed: 12/12/2021 4:17:36 PM By: Rhae Hammock RN Entered By: Rhae Hammock on 12/12/2021 11:24:30 -------------------------------------------------------------------------------- Vitals Details Patient Name: Date of Service: [name] [name]. 12/12/2021 11:00 A M Medical Record Number: [id number] Patient Account Number: [id number] Date of Birth/Sex: Treating RN: 10/05/1946 (76 y.o. [name] Primary Care Rudolfo Brandow: [name] Other Clinician: Referring Calli Bashor:  Treating Teiara Baria/Extender: Edgardo Roys Weeks in Treatment: 71 Vital Signs Time Taken: 11:13 Temperature (F): 98 Height (in): 64 Pulse (bpm): 70 Weight (lbs): 150 Respiratory Rate (breaths/min): 20 Body Mass Index (BMI): 25.7 Blood Pressure (mmHg): 109/66 Capillary Blood Glucose (mg/dl): 124 Reference Range: 80 - 120 mg / dl Electronic Signature(s) Signed: 12/12/2021 5:00:00 PM By: [name] Entered By: [name] on 12/12/2021 11:14:26

## 2021-12-12 NOTE — Progress Notes (Signed)
LEONTAE, BOSTOCK T ([id number]) . Visit Report for 12/12/2021 Chief Complaint Document Details Patient Name: Date of Service: [name], [name] [facility] T. 12/12/2021 11:00 A M Medical Record Number: [id number] Patient Account Number: [id number] Date of Birth/Sex: Treating RN: 11-Aug-1946 (76 y.o. M) Primary Care Provider: [name] Other Clinician: Referring Provider: Treating Provider/Extender: Theodosia Quay in Treatment: 41 Information Obtained from: Patient Chief Complaint Bilateral toe wounds Electronic Signature(s) Signed: 12/12/2021 1:03:22 PM By: Kalman Shan DO Entered By: Kalman Shan on 12/12/2021 12:55:08 -------------------------------------------------------------------------------- HPI Details Patient Name: Date of Service: [name] [facility] T. 12/12/2021 11:00 A M Medical Record Number: [id number] Patient Account Number: [id number] Date of Birth/Sex: Treating RN: April 26, 1946 (76 y.o. M) Primary Care Provider: [name] Other Clinician: Referring Provider: Treating Provider/Extender: Theodosia Quay in Treatment: 75 History of Present Illness HPI Description: ADMISSION 07/27/2020 This is a 76 year old man who is accompanied by his wife (ex). Nevertheless she is his active caregiver. His most complicated features are remote CVA that left him with a left hemiparesis. He also has widely metastatic prostate cancer to bone which I think is contributed to increasing frailty this year and he is no longer ambulatory. He required hospitalization from 9/7 through 9/14 with respiratory failure seizures. During this time it was noted that he had an open area in the left buttock in close proximity to the gluteal cleft. His wife tells me that he has a history of perirectal abscesses. And she describes this area as starting as a painful swelling in late August certainly sounds like an abscess. When he is in the hospital he required bedside  IandD's but did not go to the OR. Has CT scan of the area showed soft tissue thickening in the perianal ulcer. At that point it was concerning for a fistula connection however a discrete abscess was not seen. Notable that he was hypoalbuminemic in the hospital but actually came out better with an albumin of 3.1 on 9/14 his wife says he is eating well. They are currently treating this with Santyl ointment and a backing wet-to-dry dressing. By description of his wife this is done quite nicely and there is certainly less debris over the wound surface. They already have a hospital bed with a level 3 pressure relief surface. The patient lies on his back when he is in bed especially at night although his wife is counseled him not to do this. They are starting sliding board transfers into his wheelchair I think with physical therapy. He has a Foley catheter in place Past medical history includes remote CVA, metastatic prostate cancer to large areas of his thoracic and lumbar spine and pelvis., Bilateral carotid artery stenosis, Parkinson's disease, type 2 diabetes, diastolic heart failure, CHF, hypertension and a recently discovered possible pancreatic mass in the head of the pancreas 08/16/2020 patient I admitted to the clinic 2-1/2 weeks ago. He had an abscess site on his left buttock that required an IandD while he was in the hospital in September. He has been using Santyl backing wet-to-dry. He is cared for her aerobically at home by his wife 2 concerning areas on his feet. A stage I pressure injury on the right heel this does not have an open wound. He also has on the lateral aspect of the left fourth toe a dry circular eschared wound. 12/2; monthly follow-up. Is a very disabled man who had an abscess in his left buttock and required an IandD. We have been using Santyl to this area  I changed to silver collagen last time he is here and this actually looks quite a bit better The last time he was here his  wife showed me an area on the left lateral fourth toe I think a pressure injury with his fifth toe. Been using Santyl here as well but not making much improvement As well she shows me he has an area on his right buttock which is very tiny pinpoint area but under illumination still obviously an open area. I wonder if this is more of a shear injury and transferring and a pressure injury per se 12/16; this is a very disabled man who has a left buttock area that required an IandD apparently an abscess. Since he has come here that he developed a area on the right buttock. His wife says that this is draining pus. Finally he has an area on the lateral part of the left fourth toe. We have been using silver collagen on the buttock and Iodoflex on the fourth toe 1/6; this is a disabled man we have been working on a left buttock area that was initially an IandD abscess injury. We have got this down to a small area in fact it appears to be epithelializing towards the base of the wound. When he was here last time he had a small draining area on the right buttock. I thought this might be a small cyst culture of the drainage was negative. I do not think this was an abscess at all While he has been here he has been developing areas on the toes on his left foot. He initially had one on the left fourth toe and then last time the right first toe. He is a type II diabetic. With he has a history of coronary artery disease. He complains of a lot of pain in the right foot 1/13; we have been working on areas on his left buttock and then on the right buttock. Both of these are healed today albeit the area on the left has some depth with skin is gone into a divot. Nevertheless I think this is a reasonable outcome. He arrived a week ago with ischemic looking wounds on his right fifth, right first and his left fourth toe. He went on to have arterial studies which we received today these showed an ABI that was noncompressible on  both sides however the great toe on the right had a TBI of 0.14 with a pressure of only 17. Monophasic waveforms on all the tibial vessels and including the popliteal on the left he had monophasic waveforms again noncompressible vessels and no recorded toe pressure. Clearly these are ischemic wounds. Fortunately he is not complaining of a lot of pain The patient has a complicated medical situation which includes widely metastatic prostate cancer to bone for which she is on palliative care he also has Parkinson's disease. I have a recent note from his neurologist at which time he asked whether they wish to de-escalate the Parkinson's regimen and the answer was no. The patient has a follow-up appointment with Dr. Gwenlyn Found on 1/28. I believe he is the patient's cardiologist but this appointment was made because of the vascular studies I think predominantly through Dr. Kennon Holter office. 2/10; patient went back to see Dr. Gwenlyn Found. He did not feel given his comorbidities that he was a candidate for endovascular therapy. His wife seems to agree with that. He has ischemic wounds on his right toes particularly the right fifth toe fourth toe and first  toe. He is not in a lot of pain unless these are manipulated 12/20/2020 on evaluation today patient appears to be doing well with regard to his feet all things considered. There does not appear to be any obvious signs of infection here. Most of the wounds are actually for the most part eschar and dry. With that being said the patient is not actually a candidate for revascularization according to Dr. Gwenlyn Found who he has seen previous. Nonetheless I think that for the time being Betadine or something of that sort just keep things clean and dry will be a good way to go. 3/24; patient has 3 areas on the dorsal right toes first fourth and fifth that are necrotic probably ischemic. He was not felt to be a candidate for revascularization therapy. He also has an area on the left  fourth toe in a similar situation. His significant other is concerned about possibility of an abscess on his buttock. He was put on antibiotics last time. I must say that when he was here before he seemed to have recurrent small abscesses but the purulence in this culture is negative I wondered about an epidermoid cyst although this area currently is not in the same spot 4/21; patient presents for his 1 month follow-up. He has chronic necrotic right toes. The affected toes include the first fourth and fifth digits. The third toe wound has closed. He uses Betadine on this daily. He has no complaints today. He denies fever chills, erythema to the wound sites. 5/19; patient presents for 1 month follow-up. He has had no issues in the past month with his bilateral toe wounds due to arterial insufficiency. He continues to use Betadine on these daily. He has no complaints today. He denies signs of infection. 6/30; patient presents for 6-week follow-up. He denies any issues. He has been using Betadine to the areas of necrosis on the right toes. He denies signs of infection. 8/25; patient presents for 52-month follow-up. He states that 1 week ago he developed some thick drainage from the fourth toe near the toenail. Patient has been putting antibiotic ointment to this area. He denies increased warmth or erythema to the fourth toe. He denies systemic signs of infection. He continues to use Betadine to the other necrotic areas. 9/1; patient states he has taken Bactrim for the past week. He noticed yellow drainage until 2 days ago when it stopped. He denies increased warmth or erythema to the area. He denies systemic signs of infection. 9/8; patient presents for follow-up. He has completed his second round of Bactrim. He has not noticed any more drainage. He overall feels well. He has no issues or complaints today. He denies signs of infection. 11/3; patient presents for follow-up. He has no issues or complaints  today. He states that over the past 2 months he has had no issues with the toe wounds. He continues to use Betadine to them. He has not identified any signs of infection. He has no pain. 1/5; patient presents for follow-up. He has no issues or complaints today. He reports stability to the wound sites. He continues to use Betadine. He has no pain. 3/2; patient presents for follow-up. Earlier last month he called the clinic due to having drainage from the toes. Bactrim was prescribed. Patient completed this course. They did not follow-up until today. He reports no drainage from the wounds on the toes. He was recently taken off a cancer medication for his prostate cancer that was causing him significant  vomiting and diarrhea. He was evaluated for hospice however he is still not a candidate. He is doing palliative care currently. He denies systemic signs of infection. Electronic Signature(s) Signed: 12/12/2021 1:03:22 PM By: Kalman Shan DO Entered By: Kalman Shan on 12/12/2021 12:56:26 -------------------------------------------------------------------------------- Physical Exam Details Patient Name: Date of Service: [name] HN T. 12/12/2021 11:00 A M Medical Record Number: [id number] Patient Account Number: [id number] Date of Birth/Sex: Treating RN: 1946-08-27 (76 y.o. M) Primary Care Provider: [name] Other Clinician: Referring Provider: Treating Provider/Extender: Theodosia Quay in Treatment: 26 Constitutional respirations regular, non-labored and within target range for patient.Marland Kitchen Psychiatric pleasant and cooperative. Notes Right foot: He has an eschar to the dorsal first, fourth and fifth toes. The fifth toe wound is new however this is a very small area. There is no drainage noted. There is no increased warmth or erythema. Foot and leg are appropriately warm. Electronic Signature(s) Signed: 12/12/2021 1:03:22 PM By: Kalman Shan DO Entered  By: Kalman Shan on 12/12/2021 12:59:55 -------------------------------------------------------------------------------- Physician Orders Details Patient Name: Date of Service: [name] HN T. 12/12/2021 11:00 A M Medical Record Number: [id number] Patient Account Number: [id number] Date of Birth/Sex: Treating RN: 1945/10/31 (76 y.o. [name] Primary Care Provider: [name] Other Clinician: Referring Provider: Treating Provider/Extender: Theodosia Quay in Treatment: 68 Verbal / Phone Orders: No Diagnosis Coding ICD-10 Coding Code Description L97.528 Non-pressure chronic ulcer of other part of left foot with other specified severity L97.514 Non-pressure chronic ulcer of other part of right foot with necrosis of bone E11.51 Type 2 diabetes mellitus with diabetic peripheral angiopathy without gangrene I73.9 Peripheral vascular disease, unspecified Follow-up Appointments Return appointment in 1 month. - w/ Dr. Heber [place] [facility], Room 7) Other: - Patient under Palliative Care Bathing/ Shower/ Hygiene May shower with protection but do not get wound dressing(s) wet. Edema Control - Lymphedema / SCD / Other Moisturize legs daily. Off-Loading Low air-loss mattress (Group 2) Turn and reposition every 2 hours Other: - Use Bunny Boots for protection Up in wheelchair no more than 2 hour increments. Ensure to relieve pressure off buttock closed areas to prevent reopening. Home Health No change in wound care orders this week; continue Home Health for wound care. May utilize formulary equivalent dressing for wound treatment orders unless otherwise specified. Other Home Health Orders/Instructions: - [facility] Wound Treatment Wound #4 - T Great oe Wound Laterality: Right Cleanser: Wound Cleanser (Home Health) 1 x Per Day/30 Days Discharge Instructions: Cleanse the wound with wound cleanser prior to applying a clean dressing using gauze sponges, not tissue  or cotton balls. Prim Dressing: Betadine 1 x Per Day/30 Days ary Discharge Instructions: apply betadine to wound. Secondary Dressing: Woven Gauze Sponges 2x2 in (Home Health) 1 x Per Day/30 Days Discharge Instructions: Apply over primary dressing as directed. Secured With: Child psychotherapist, Sterile 2x75 (in/in) (Home Health) 1 x Per Day/30 Days Discharge Instructions: Secure with stretch gauze as directed. Wound #6 - T Fourth oe Wound Laterality: Right Cleanser: Wound Cleanser (Home Health) 1 x Per Day/30 Days Discharge Instructions: Cleanse the wound with wound cleanser prior to applying a clean dressing using gauze sponges, not tissue or cotton balls. Prim Dressing: Betadine 1 x Per Day/30 Days ary Discharge Instructions: apply betadine to wound. Secondary Dressing: Woven Gauze Sponges 2x2 in (Home Health) 1 x Per Day/30 Days Discharge Instructions: Apply over primary dressing as directed. Secured With: Child psychotherapist, Sterile 2x75 (in/in) (Home Health) 1 x Per  Day/30 Days Discharge Instructions: Secure with stretch gauze as directed. Wound #7 - T Fifth oe Wound Laterality: Right Cleanser: Wound Cleanser (Home Health) 1 x Per Day/30 Days Discharge Instructions: Cleanse the wound with wound cleanser prior to applying a clean dressing using gauze sponges, not tissue or cotton balls. Prim Dressing: Betadine 1 x Per Day/30 Days ary Discharge Instructions: apply betadine to wound. Secondary Dressing: Woven Gauze Sponges 2x2 in (Home Health) 1 x Per Day/30 Days Discharge Instructions: Apply over primary dressing as directed. Secured With: Child psychotherapist, Sterile 2x75 (in/in) (Home Health) 1 x Per Day/30 Days Discharge Instructions: Secure with stretch gauze as directed. Electronic Signature(s) Signed: 12/12/2021 1:03:22 PM By: Kalman Shan DO Entered By: Kalman Shan on 12/12/2021  13:00:10 -------------------------------------------------------------------------------- Problem List Details Patient Name: Date of Service: [name] HN T. 12/12/2021 11:00 A M Medical Record Number: [id number] Patient Account Number: [id number] Date of Birth/Sex: Treating RN: 07-17-1946 (76 y.o. [name] Primary Care Provider: [name] Other Clinician: Referring Provider: Treating Provider/Extender: Theodosia Quay in Treatment: 79 Active Problems ICD-10 Encounter Code Description Active Date MDM Diagnosis L97.528 Non-pressure chronic ulcer of other part of left foot with other specified 08/16/2020 No Yes severity L97.514 Non-pressure chronic ulcer of other part of right foot with necrosis of bone 10/18/2020 No Yes E11.51 Type 2 diabetes mellitus with diabetic peripheral angiopathy without gangrene 10/18/2020 No Yes I73.9 Peripheral vascular disease, unspecified 06/06/2021 No Yes Inactive Problems ICD-10 Code Description Active Date Inactive Date L89.611 Pressure ulcer of right heel, stage 1 08/16/2020 08/16/2020 L89.312 Pressure ulcer of right buttock, stage 2 09/13/2020 09/13/2020 Resolved Problems ICD-10 Code Description Active Date Resolved Date L02.31 Cutaneous abscess of buttock 07/27/2020 07/27/2020 S31.829D Unspecified open wound of left buttock, subsequent encounter 07/27/2020 07/27/2020 Electronic Signature(s) Signed: 12/12/2021 1:03:22 PM By: Kalman Shan DO Previous Signature: 12/12/2021 11:02:31 AM Version By: Lorrin Jackson Entered By: Kalman Shan on 12/12/2021 12:54:50 -------------------------------------------------------------------------------- Progress Note Details Patient Name: Date of Service: [name] HN T. 12/12/2021 11:00 A M Medical Record Number: [id number] Patient Account Number: [id number] Date of Birth/Sex: Treating RN: 07/16/1946 (76 y.o. M) Primary Care Provider: [name] Other  Clinician: Referring Provider: Treating Provider/Extender: Theodosia Quay in Treatment: 29 Subjective Chief Complaint Information obtained from Patient Bilateral toe wounds History of Present Illness (HPI) ADMISSION 07/27/2020 This is a 76 year old man who is accompanied by his wife (ex). Nevertheless she is his active caregiver. His most complicated features are remote CVA that left him with a left hemiparesis. He also has widely metastatic prostate cancer to bone which I think is contributed to increasing frailty this year and he is no longer ambulatory. He required hospitalization from 9/7 through 9/14 with respiratory failure seizures. During this time it was noted that he had an open area in the left buttock in close proximity to the gluteal cleft. His wife tells me that he has a history of perirectal abscesses. And she describes this area as starting as a painful swelling in late August certainly sounds like an abscess. When he is in the hospital he required bedside IandD's but did not go to the OR. Has CT scan of the area showed soft tissue thickening in the perianal ulcer. At that point it was concerning for a fistula connection however a discrete abscess was not seen. Notable that he was hypoalbuminemic in the hospital but actually came out better with an albumin of 3.1 on 9/14 his wife says he is eating  well. They are currently treating this with Santyl ointment and a backing wet-to-dry dressing. By description of his wife this is done quite nicely and there is certainly less debris over the wound surface. They already have a hospital bed with a level 3 pressure relief surface. The patient lies on his back when he is in bed especially at night although his wife is counseled him not to do this. They are starting sliding board transfers into his wheelchair I think with physical therapy. He has a Foley catheter in place Past medical history includes remote CVA,  metastatic prostate cancer to large areas of his thoracic and lumbar spine and pelvis., Bilateral carotid artery stenosis, Parkinson's disease, type 2 diabetes, diastolic heart failure, CHF, hypertension and a recently discovered possible pancreatic mass in the head of the pancreas 08/16/2020 patient I admitted to the clinic 2-1/2 weeks ago. He had an abscess site on his left buttock that required an IandD while he was in the hospital in September. He has been using Santyl backing wet-to-dry. He is cared for her aerobically at home by his wife 2 concerning areas on his feet. A stage I pressure injury on the right heel this does not have an open wound. He also has on the lateral aspect of the left fourth toe a dry circular eschared wound. 12/2; monthly follow-up. Is a very disabled man who had an abscess in his left buttock and required an IandD. We have been using Santyl to this area I changed to silver collagen last time he is here and this actually looks quite a bit better ooThe last time he was here his wife showed me an area on the left lateral fourth toe I think a pressure injury with his fifth toe. Been using Santyl here as well but not making much improvement ooAs well she shows me he has an area on his right buttock which is very tiny pinpoint area but under illumination still obviously an open area. I wonder if this is more of a shear injury and transferring and a pressure injury per se 12/16; this is a very disabled man who has a left buttock area that required an IandD apparently an abscess. Since he has come here that he developed a area on the right buttock. His wife says that this is draining pus. Finally he has an area on the lateral part of the left fourth toe. We have been using silver collagen on the buttock and Iodoflex on the fourth toe 1/6; this is a disabled man we have been working on a left buttock area that was initially an IandD abscess injury. We have got this down to a  small area in fact it appears to be epithelializing towards the base of the wound. When he was here last time he had a small draining area on the right buttock. I thought this might be a small cyst culture of the drainage was negative. I do not think this was an abscess at all While he has been here he has been developing areas on the toes on his left foot. He initially had one on the left fourth toe and then last time the right first toe. He is a type II diabetic. With he has a history of coronary artery disease. He complains of a lot of pain in the right foot 1/13; we have been working on areas on his left buttock and then on the right buttock. Both of these are healed today albeit the area on  the left has some depth with skin is gone into a divot. Nevertheless I think this is a reasonable outcome. He arrived a week ago with ischemic looking wounds on his right fifth, right first and his left fourth toe. He went on to have arterial studies which we received today these showed an ABI that was noncompressible on both sides however the great toe on the right had a TBI of 0.14 with a pressure of only 17. Monophasic waveforms on all the tibial vessels and including the popliteal on the left he had monophasic waveforms again noncompressible vessels and no recorded toe pressure. Clearly these are ischemic wounds. Fortunately he is not complaining of a lot of pain The patient has a complicated medical situation which includes widely metastatic prostate cancer to bone for which she is on palliative care he also has Parkinson's disease. I have a recent note from his neurologist at which time he asked whether they wish to de-escalate the Parkinson's regimen and the answer was no. The patient has a follow-up appointment with Dr. Gwenlyn Found on 1/28. I believe he is the patient's cardiologist but this appointment was made because of the vascular studies I think predominantly through Dr. Kennon Holter office. 2/10; patient  went back to see Dr. Gwenlyn Found. He did not feel given his comorbidities that he was a candidate for endovascular therapy. His wife seems to agree with that. He has ischemic wounds on his right toes particularly the right fifth toe fourth toe and first toe. He is not in a lot of pain unless these are manipulated 12/20/2020 on evaluation today patient appears to be doing well with regard to his feet all things considered. There does not appear to be any obvious signs of infection here. Most of the wounds are actually for the most part eschar and dry. With that being said the patient is not actually a candidate for revascularization according to Dr. Gwenlyn Found who he has seen previous. Nonetheless I think that for the time being Betadine or something of that sort just keep things clean and dry will be a good way to go. 3/24; patient has 3 areas on the dorsal right toes first fourth and fifth that are necrotic probably ischemic. He was not felt to be a candidate for revascularization therapy. He also has an area on the left fourth toe in a similar situation. His significant other is concerned about possibility of an abscess on his buttock. He was put on antibiotics last time. I must say that when he was here before he seemed to have recurrent small abscesses but the purulence in this culture is negative I wondered about an epidermoid cyst although this area currently is not in the same spot 4/21; patient presents for his 1 month follow-up. He has chronic necrotic right toes. The affected toes include the first fourth and fifth digits. The third toe wound has closed. He uses Betadine on this daily. He has no complaints today. He denies fever chills, erythema to the wound sites. 5/19; patient presents for 1 month follow-up. He has had no issues in the past month with his bilateral toe wounds due to arterial insufficiency. He continues to use Betadine on these daily. He has no complaints today. He denies signs of  infection. 6/30; patient presents for 6-week follow-up. He denies any issues. He has been using Betadine to the areas of necrosis on the right toes. He denies signs of infection. 8/25; patient presents for 45-month follow-up. He states that 1 week ago  he developed some thick drainage from the fourth toe near the toenail. Patient has been putting antibiotic ointment to this area. He denies increased warmth or erythema to the fourth toe. He denies systemic signs of infection. He continues to use Betadine to the other necrotic areas. 9/1; patient states he has taken Bactrim for the past week. He noticed yellow drainage until 2 days ago when it stopped. He denies increased warmth or erythema to the area. He denies systemic signs of infection. 9/8; patient presents for follow-up. He has completed his second round of Bactrim. He has not noticed any more drainage. He overall feels well. He has no issues or complaints today. He denies signs of infection. 11/3; patient presents for follow-up. He has no issues or complaints today. He states that over the past 2 months he has had no issues with the toe wounds. He continues to use Betadine to them. He has not identified any signs of infection. He has no pain. 1/5; patient presents for follow-up. He has no issues or complaints today. He reports stability to the wound sites. He continues to use Betadine. He has no pain. 3/2; patient presents for follow-up. Earlier last month he called the clinic due to having drainage from the toes. Bactrim was prescribed. Patient completed this course. They did not follow-up until today. He reports no drainage from the wounds on the toes. He was recently taken off a cancer medication for his prostate cancer that was causing him significant vomiting and diarrhea. He was evaluated for hospice however he is still not a candidate. He is doing palliative care currently. He denies systemic signs of infection. Patient  History Information obtained from Patient. Family History Unknown History. Social History Former smoker - quit 12 years ago, Marital Status - Divorced, Alcohol Use - Never, Drug Use - No History, Caffeine Use - Rarely. Medical History Cardiovascular Patient has history of Congestive Heart Failure, Coronary Artery Disease, Hypertension Endocrine Patient has history of Type II Diabetes Denies history of Type I Diabetes Neurologic Patient has history of Seizure Disorder Oncologic Denies history of Received Chemotherapy, Received Radiation Medical A Surgical History Notes nd Cardiovascular CVA, Carotid stenosis Genitourinary CKD stage 3 Neurologic Parkinson's, left hemiparesis, CVA Oncologic prostate cancer with mets to bone, bladder cancer Objective Constitutional respirations regular, non-labored and within target range for patient.. Vitals Time Taken: 11:13 AM, Height: 64 in, Weight: 150 lbs, BMI: 25.7, Temperature: 98 F, Pulse: 70 bpm, Respiratory Rate: 20 breaths/min, Blood Pressure: 109/66 mmHg, Capillary Blood Glucose: 124 mg/dl. Psychiatric pleasant and cooperative. General Notes: Right foot: He has an eschar to the dorsal first, fourth and fifth toes. The fifth toe wound is new however this is a very small area. There is no drainage noted. There is no increased warmth or erythema. Foot and leg are appropriately warm. Integumentary (Hair, Skin) Wound #4 status is Open. Original cause of wound was Not Known. The date acquired was: 10/11/2020. The wound has been in treatment 60 weeks. The wound is located on the Right T Great. The wound measures 0.8cm length x 0.9cm width x 0cm depth; 0.565cm^2 area and 0.057cm^3 volume. There is Fat Layer oe (Subcutaneous Tissue) exposed. There is no tunneling or undermining noted. There is a medium amount of serosanguineous drainage noted. The wound margin is distinct with the outline attached to the wound base. There is small (1-33%)  red granulation within the wound bed. There is a large (67-100%) amount of necrotic tissue within the wound bed including Eschar.  Wound #6 status is Open. Original cause of wound was Gradually Appeared. The date acquired was: 11/22/2020. The wound has been in treatment 55 weeks. The wound is located on the Right T Fourth. The wound measures 1cm length x 1.5cm width x 0.1cm depth; 1.178cm^2 area and 0.118cm^3 volume. There is oe Fat Layer (Subcutaneous Tissue) exposed. There is no tunneling or undermining noted. There is a medium amount of serosanguineous drainage noted. The wound margin is distinct with the outline attached to the wound base. There is no granulation within the wound bed. There is a large (67-100%) amount of necrotic tissue within the wound bed including Eschar. Wound #7 status is Open. Original cause of wound was Gradually Appeared. The date acquired was: 12/02/2021. The wound is located on the Right T Fifth. oe The wound measures 0.4cm length x 0.3cm width x 0.1cm depth; 0.094cm^2 area and 0.009cm^3 volume. There is Fat Layer (Subcutaneous Tissue) exposed. There is no tunneling or undermining noted. There is a medium amount of serosanguineous drainage noted. The wound margin is distinct with the outline attached to the wound base. There is no granulation within the wound bed. There is a large (67-100%) amount of necrotic tissue within the wound bed including Eschar. 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 He has a small wound that is eschared on the right fifth toe. No surrounding signs of infection. I recommended continuing Betadine daily to keep the areas from getting infected.  Patient has a history of Parkinson's and metastatic prostate cancer with significant peripheral arterial disease. He is not a candidate for revascularization. This is a palliative wound care case. Plan Follow-up Appointments: Return appointment in 1 month. - w/ Dr. Heber Beaumont Leveda Anna, Room 7) Other: - Patient under Palliative Care Bathing/ Shower/ Hygiene: May shower with protection but do not get wound dressing(s) wet. Edema Control - Lymphedema / SCD / Other: Moisturize legs daily. Off-Loading: Low air-loss mattress (Group 2) Turn and reposition every 2 hours Other: - Use Bunny Boots for protection Up in wheelchair no more than 2 hour increments. Ensure to relieve pressure off buttock closed areas to prevent reopening. Home Health: No change in wound care orders this week; continue Home Health for wound care. May utilize formulary equivalent dressing for wound treatment orders unless otherwise specified. Other Home Health Orders/Instructions: - [facility] WOUND #4: - T Great Wound Laterality: Right oe Cleanser: Wound Cleanser (Home Health) 1 x Per Day/30 Days Discharge Instructions: Cleanse the wound with wound cleanser prior to applying a clean dressing using gauze sponges, not tissue or cotton balls. Prim Dressing: Betadine 1 x Per Day/30 Days ary Discharge Instructions: apply betadine to wound. Secondary Dressing: Woven Gauze Sponges 2x2 in (Home Health) 1 x Per Day/30 Days Discharge Instructions: Apply over primary dressing as directed. Secured With: Child psychotherapist, Sterile 2x75 (in/in) (Home Health) 1 x Per Day/30 Days Discharge Instructions: Secure with stretch gauze as directed. WOUND #6: - T Fourth Wound Laterality: Right oe Cleanser: Wound Cleanser (Home Health) 1 x Per Day/30 Days Discharge Instructions: Cleanse the wound with wound cleanser prior to applying a clean dressing using gauze sponges, not tissue or cotton balls. Prim Dressing: Betadine 1 x Per  Day/30 Days ary Discharge Instructions: apply betadine to wound. Secondary Dressing:  Woven Gauze Sponges 2x2 in (Home Health) 1 x Per Day/30 Days Discharge Instructions: Apply over primary dressing as directed. Secured With: Child psychotherapist, Sterile 2x75 (in/in) (Home Health) 1 x Per Day/30 Days Discharge Instructions: Secure with stretch gauze as directed. WOUND #7: - T Fifth Wound Laterality: Right oe Cleanser: Wound Cleanser (Home Health) 1 x Per Day/30 Days Discharge Instructions: Cleanse the wound with wound cleanser prior to applying a clean dressing using gauze sponges, not tissue or cotton balls. Prim Dressing: Betadine 1 x Per Day/30 Days ary Discharge Instructions: apply betadine to wound. Secondary Dressing: Woven Gauze Sponges 2x2 in (Home Health) 1 x Per Day/30 Days Discharge Instructions: Apply over primary dressing as directed. Secured With: Child psychotherapist, Sterile 2x75 (in/in) (Home Health) 1 x Per Day/30 Days Discharge Instructions: Secure with stretch gauze as directed. 1. Betadine daily 2. Follow-up in 1 month Electronic Signature(s) Signed: 12/12/2021 1:03:22 PM By: Kalman Shan DO Entered By: Kalman Shan on 12/12/2021 13:01:52 -------------------------------------------------------------------------------- HxROS Details Patient Name: Date of Service: [name] HN T. 12/12/2021 11:00 A M Medical Record Number: [id number] Patient Account Number: [id number] Date of Birth/Sex: Treating RN: 1945-10-25 (76 y.o. M) Primary Care Provider: [name] Other Clinician: Referring Provider: Treating Provider/Extender: Theodosia Quay in Treatment: 36 Information Obtained From Patient Cardiovascular Medical History: Positive for: Congestive Heart Failure; Coronary Artery Disease; Hypertension Past Medical History Notes: CVA, Carotid stenosis Endocrine Medical History: Positive for: Type II  Diabetes Negative for: Type I Diabetes Time with diabetes: 5 years ago Treated with: Insulin Blood sugar tested every day: Yes Tested : once a day Genitourinary Medical History: Past Medical History Notes: CKD stage 3 Neurologic Medical History: Positive for: Seizure Disorder Past Medical History Notes: Parkinson's, left hemiparesis, CVA Oncologic Medical History: Negative for: Received Chemotherapy; Received Radiation Past Medical History Notes: prostate cancer with mets to bone, bladder cancer Immunizations Pneumococcal Vaccine: Received Pneumococcal Vaccination: Yes Received Pneumococcal Vaccination On or After 60th Birthday: No Implantable Devices None Family and Social History Unknown History: Yes; Former smoker - quit 12 years ago; Marital Status - Divorced; Alcohol Use: Never; Drug Use: No History; Caffeine Use: Rarely; Financial Concerns: No; Food, Clothing or Shelter Needs: No; Support System Lacking: No; Transportation Concerns: No Electronic Signature(s) Signed: 12/12/2021 1:03:22 PM By: Kalman Shan DO Entered By: Kalman Shan on 12/12/2021 12:56:32 -------------------------------------------------------------------------------- SuperBill Details Patient Name: Date of Service: [name] HN T. 12/12/2021 Medical Record Number: [id number] Patient Account Number: [id number] Date of Birth/Sex: Treating RN: 12-05-1945 (76 y.o. [name] Primary Care Provider: [name] Other Clinician: Referring Provider: Treating Provider/Extender: Theodosia Quay in Treatment: 71 Diagnosis Coding ICD-10 Codes Code Description [phone] Non-pressure chronic ulcer of other part of left foot with other specified severity L97.514 Non-pressure chronic ulcer of other part of right foot with necrosis of bone E11.51 Type 2 diabetes mellitus with diabetic peripheral angiopathy without gangrene I73.9 Peripheral vascular disease,  unspecified Facility Procedures CPT4 Code: [id number] Description: 99214 - WOUND CARE VISIT-LEV 4 EST PT Modifier: Quantity: 1 Physician Procedures : CPT4 Code Description Modifier [id number] - WC PHYS LEVEL 3 - EST PT ICD-10 Diagnosis Description L97.514 Non-pressure chronic ulcer of other part of right foot with necrosis of bone I73.9 Peripheral vascular disease, unspecified E11.51 Type 2  diabetes mellitus with diabetic peripheral angiopathy without gangrene Quantity: 1 Electronic Signature(s) Signed: 12/12/2021 1:03:22 PM By: Kalman Shan DO Entered By: Kalman Shan on 12/12/2021 13:02:40

## 2021-12-15 ENCOUNTER — Encounter: Payer: Self-pay | Admitting: Family Medicine

## 2021-12-17 DIAGNOSIS — R339 Retention of urine, unspecified: Secondary | ICD-10-CM | POA: Diagnosis not present

## 2021-12-17 DIAGNOSIS — N1832 Chronic kidney disease, stage 3b: Secondary | ICD-10-CM | POA: Diagnosis not present

## 2021-12-17 DIAGNOSIS — I69354 Hemiplegia and hemiparesis following cerebral infarction affecting left non-dominant side: Secondary | ICD-10-CM | POA: Diagnosis not present

## 2021-12-17 DIAGNOSIS — S90411D Abrasion, right great toe, subsequent encounter: Secondary | ICD-10-CM | POA: Diagnosis not present

## 2021-12-17 DIAGNOSIS — G20 Parkinson's disease: Secondary | ICD-10-CM | POA: Diagnosis not present

## 2021-12-17 DIAGNOSIS — I5032 Chronic diastolic (congestive) heart failure: Secondary | ICD-10-CM | POA: Diagnosis not present

## 2021-12-17 DIAGNOSIS — Z466 Encounter for fitting and adjustment of urinary device: Secondary | ICD-10-CM | POA: Diagnosis not present

## 2021-12-17 DIAGNOSIS — L89892 Pressure ulcer of other site, stage 2: Secondary | ICD-10-CM | POA: Diagnosis not present

## 2021-12-17 DIAGNOSIS — E1122 Type 2 diabetes mellitus with diabetic chronic kidney disease: Secondary | ICD-10-CM | POA: Diagnosis not present

## 2021-12-17 DIAGNOSIS — I130 Hypertensive heart and chronic kidney disease with heart failure and stage 1 through stage 4 chronic kidney disease, or unspecified chronic kidney disease: Secondary | ICD-10-CM | POA: Diagnosis not present

## 2021-12-18 ENCOUNTER — Encounter: Payer: Self-pay | Admitting: Family Medicine

## 2021-12-18 ENCOUNTER — Other Ambulatory Visit: Payer: Medicare Other | Admitting: Family Medicine

## 2021-12-18 VITALS — BP 106/44 | HR 79 | Temp 97.4°F | Resp 20

## 2021-12-18 DIAGNOSIS — R112 Nausea with vomiting, unspecified: Secondary | ICD-10-CM | POA: Insufficient documentation

## 2021-12-18 DIAGNOSIS — E861 Hypovolemia: Secondary | ICD-10-CM

## 2021-12-18 DIAGNOSIS — Z515 Encounter for palliative care: Secondary | ICD-10-CM | POA: Diagnosis not present

## 2021-12-18 DIAGNOSIS — R34 Anuria and oliguria: Secondary | ICD-10-CM | POA: Insufficient documentation

## 2021-12-18 DIAGNOSIS — R197 Diarrhea, unspecified: Secondary | ICD-10-CM | POA: Diagnosis not present

## 2021-12-18 DIAGNOSIS — I9589 Other hypotension: Secondary | ICD-10-CM | POA: Diagnosis not present

## 2021-12-18 DIAGNOSIS — E860 Dehydration: Secondary | ICD-10-CM | POA: Insufficient documentation

## 2021-12-18 IMAGING — DX DG CHEST 1V PORT
1 series · 1 of 1 positions shown · non-contrast
Comparison: Chest x-ray 06/11/2020.

CLINICAL DATA: Shortness of breath, concern for pneumonia.
Productive cough.

EXAM:
PORTABLE CHEST 1 VIEW

[chest ap]
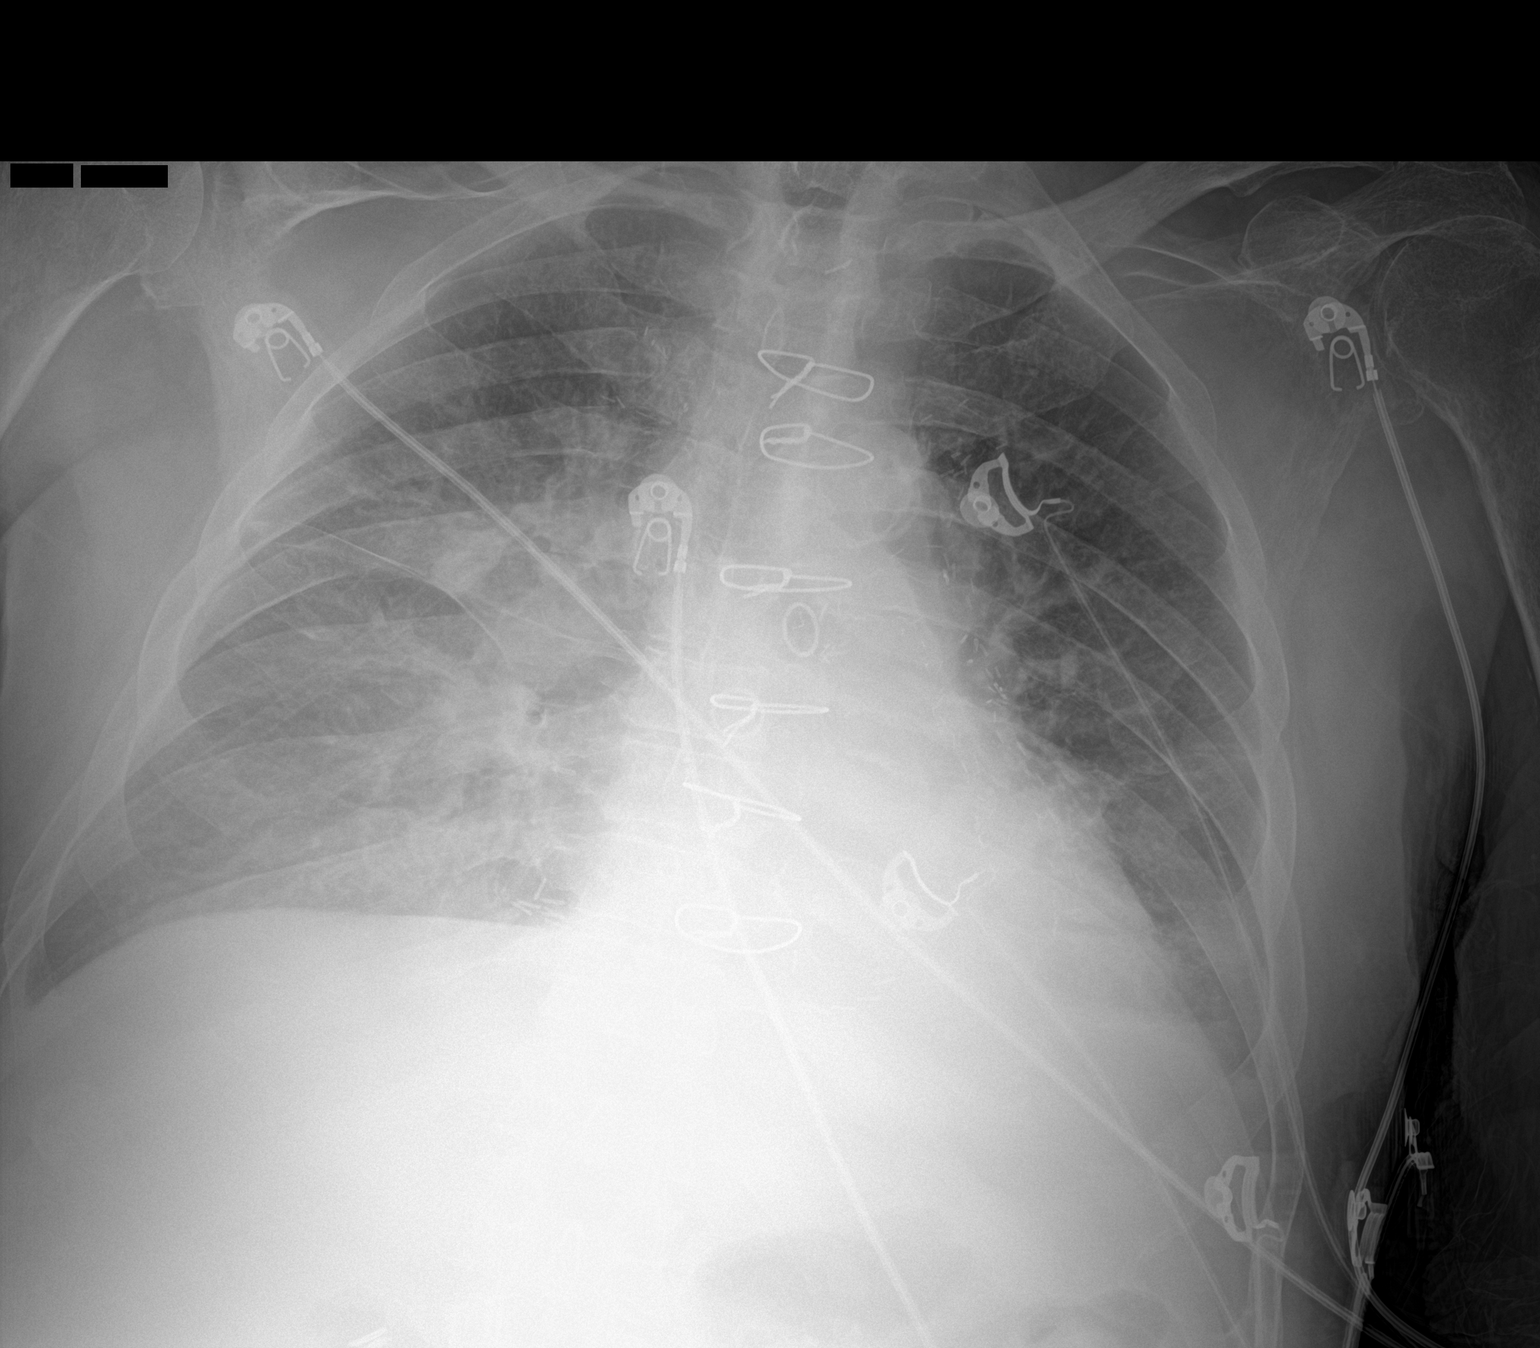

[1 of 1 positions shown; findings below may reference images not displayed]

FINDINGS: Interval removal of a right internal jugular central venous
catheter. Vascular surgical clips overlying the mediastinum. Intact
sternotomy wires are again identified.

The heart size is unchanged. Silhouetting off of the right and left
cardiac borders as well as the medial left hemidiaphragm due to
pulmonary findings. Bilateral mid to lower lung zone patchy airspace
opacity. Interval development of retrocardiac opacity.

No acute osseous finding.
IMPRESSION: Interval development of multifocal pneumonia.

## 2021-12-18 NOTE — Progress Notes (Signed)
[facility] Palliative Care Consult Note Telephone: [phone]  Fax: [phone]    Date of encounter: 12/18/21 8:39 PM PATIENT NAME: [name] [place] [id number]   [phone] (home)  DOB: November 05, 1945 MRN: [id number] PRIMARY CARE PROVIDER:    [name], MD,  [place] [address] [phone]  REFERRING PROVIDER:   [name], MD [address] [place],  [place] [address] [phone]  RESPONSIBLE PARTY:    Contact Information     Name Relation Home Work [place] K Relative [phone]  [phone]   [facility] Daughter   [phone]   Nikolos, Billig [phone]     rett, stehlik   [phone]        I met face to face with patient and ex-wife/HC POA [name] in their home. Palliative Care was asked to follow this patient by consultation request of  [name], MD to address advance care planning and complex medical decision making. This is a follow up visit.                                   ASSESSMENT, SYMPTOM MANAGEMENT AND PLAN / RECOMMENDATIONS:   Palliative Care Encounter-Metastatic prostate cancer Revised MOST-DNR/DNI, comfort measures, Antibiotics if indicated, IV fluids on time limited basis and no feeding tube.  Maintain at home, if symptoms cannot be controlled at home, may consider Hospice Home inpatient admission. Hold all BP meds, insulins, cholesterol meds.   Continue Dantrolene for spasm, Keppra for seizure, med for BPH, Sinemet, Omeprazole and anti-platelet therapy for now until further evaluated by Hospice. Dr Stephanie Acre has agreed with Hospice referral, wishes to remain attending and requests that pt be given 1 liter of IV fluid for dehydration, nausea/vomiting by Hospice nurse for comfort. Advised wife not to push food or fluids but allow pt to have comfort feeds/liquids but give sips to  tolerability. Agree with OxyCodone as ordered prn for pain, dyspnea for now.  Has home O2 prn dyspnea.  2. Nausea, vomiting and diarrhea with dehydration Hold all insulin with poor intake/likely metabolic acidosis with risk for hypoglycemia  3.  Oliguria & hypotension due to Dehydration on exam secondary to #2 Sips of liquids as tolerated Hold Lasix-appears hypovolemic at present.     Follow up Palliative Care Visit: Palliative care will transition to Hospice to continue to follow for complex medical decision making, advance care planning, and clarification of goals. Hospice admission visit on 12/19/20 at 9 am.   This visit was coded based on medical decision making (MDM).  PPS: 20%  HOSPICE ELIGIBILITY/DIAGNOSIS: TBD  Chief Complaint:  Pt's caregiver/ex-wife [name] called and requested an urgent acute visit to assess pt for hospice eligibility with pt having large volume loose stools and hypotension.  HISTORY OF PRESENT ILLNESS:  BECKEM TOMBERLIN is a 76 y.o. 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  Received call from pt's ex-wife that she thought he might be ready for Hospice services.  She states that over the last 24 hours she fixed pt's breakfast and he ate minimally but  then vomited it back.  He also had 7-8 large volume loose stools. Both emesis and stools without blood present.  In the day prior the pt had 3-4 large volume loose stools.  He denies fever, chills, abdominal pain, nausea prior to vomiting. His last normal soft formed BM was on 12/12/21.  He has had antibiotics (doxycycline) in the last month but she denies foul odor to  stool.  Ex wife indicates that if he eats or drinks anything that this provokes diarrhea.  He has gotten weaker and unable to assist her with spontaneous movement when cleaning him after toileting.  She states he did have one occasion of pocketing food where she had to tell him to swallow. Currently he is so weak he is having some tremors so she is feeding him but he has been eating minimally and only having sips.  His urine output which 2 days ago was around 1200 cc dropped to 400 cc yesterday over 27 hours.  She emptied his foley at 9 am this morning and pt has only put out 50 cc dark amber urine.  She was asking about how to get his medications in him this evening so we reviewed current medications with instructions to administer or hold.  Left message with receptionist at Dr Stephanie Acre' office to ask for her agreement to Hospice and if she would serve as attending. Spoke with referral specialist to see if we could facilitate rapid transition to Hospice services.  History obtained from review of EMR, discussion with primary team, and interview with family, facility staff/caregiver and/or [name].  I reviewed available labs, medications, imaging, studies and related documents from the EMR.  Records reviewed and summarized above.   ROS General: NAD, fatigued Cardiovascular: denies chest pain, denies DOE Pulmonary: endorses cough, denies increased SOB Abdomen: endorses poor appetite, endorses incontinence of bowel with large volume loose stools GU: denies dysuria, has foley cath MSK:  endorses increased weakness, no falls reported Skin: denies rashes, has pressure wounds on bilateral toes Neurological: denies pain, denies insomnia Psych: Endorses fatigue in coping with illness Heme/lymph/immuno: denies bruises, denies abnormal bleeding  Physical Exam: Current and past weights: pt bedbound and unable to weigh. Constitutional: NAD, appears to have sallow yellow tone to face, not eyes General:  frail appearing, thin EYES: anicteric sclera, lids intact, no discharge  ENMT: intact hearing, oral mucous membranes tacky, dentition intact CV: S1S2, RRR, no LE edema Pulmonary: CTAB, mild tachypnea, intermittent cough productive of thick clear phlegm,, room air Abdomen: hypo-active BS + 4 quadrants, soft and non tender, no ascites GU: deferred, foley cath in place draining minimal dark amber urine MSK: no sarcopenia, moves left side extremities, bedbound Skin: warm and dry, no rashes, has wounds to distal toes of bilateral feet, worse on right Neuro:  noted generalized weakness, no cognitive impairment Psych: non-anxious affect, A and O x 3 Hem/lymph/immuno: no widespread bruising   ---------------------------------------------------------------------------------------------------------------------------------------------------------------------------------------------------------------- Advance Care Planning/Goals of Care: Goals include to maximize quality of life and symptom management. Patient & health care surrogate gave their permission to discuss. Our advance care planning conversation included a discussion about:    The value and importance of advance care planning-Expressed wishes are to remain at home, not in hospital unless pain needs cannot be met at home.  Initially thought he might want short trial of intubation but advised this would require intensive care and may have to require decision from [facility] POA to disconnect if no improvement but he then declined treatment.  Also talked about feeding tube but was informed that he would have to go to the hospital to have this surgically placed and he declined this treatment. Experiences with loved ones who have been seriously ill or have died-pt asked to watch video of ER CPR and expressed concern that due to the cancer invading his bones he was concerned about possible rib fractures.  Exploration of personal, cultural or spiritual beliefs  that might influence medical decisions-Ex wife had been told previously after serious illness that pt was unlikely to survive and ex-wife nursed him back to health and function. Although she believes his condition to be terminal she still thinks that care will make him live longer Exploration of goals of care in the event of a sudden injury or illness-now requesting DNR, DNI status.   Identification of a healthcare agent-ex wife [name] Review and updated MOST per patient's expressed wishes Decision not to resuscitate or to de-escalate disease focused treatments due to poor prognosis. CODE STATUS: Revised MOST 12/18/21: DNR/DNI Comfort Antibiotics if indicated IV fluids for a defined trial No feeding tube   I spent 35 minutes providing this consultation. More than 50% of the time in this consultation was spent in counseling and care coordination.  Time Spent:  4:10 pm to 4:45 pm   Thank you for the opportunity to participate in the care of Mr. Lafauci.  The palliative care team will transition patient to the [facility] team. Please call our office at [phone] if we can be of additional assistance.   Marijo Conception, FNP -C  COVID-19 PATIENT SCREENING TOOL Asked and negative response unless otherwise noted:   Have you had symptoms of covid, tested positive or been in contact with someone with symptoms/positive test in the past 5-10 days?  unknown

## 2021-12-19 ENCOUNTER — Other Ambulatory Visit: Payer: Self-pay

## 2021-12-20 IMAGING — DX DG CHEST 2V
2 series · 2 of 2 positions shown · non-contrast
Comparison: 06/19/2020

CLINICAL DATA: Pneumonia.  Shortness of breath.

EXAM:
CHEST - 2 VIEW

[chest lat]
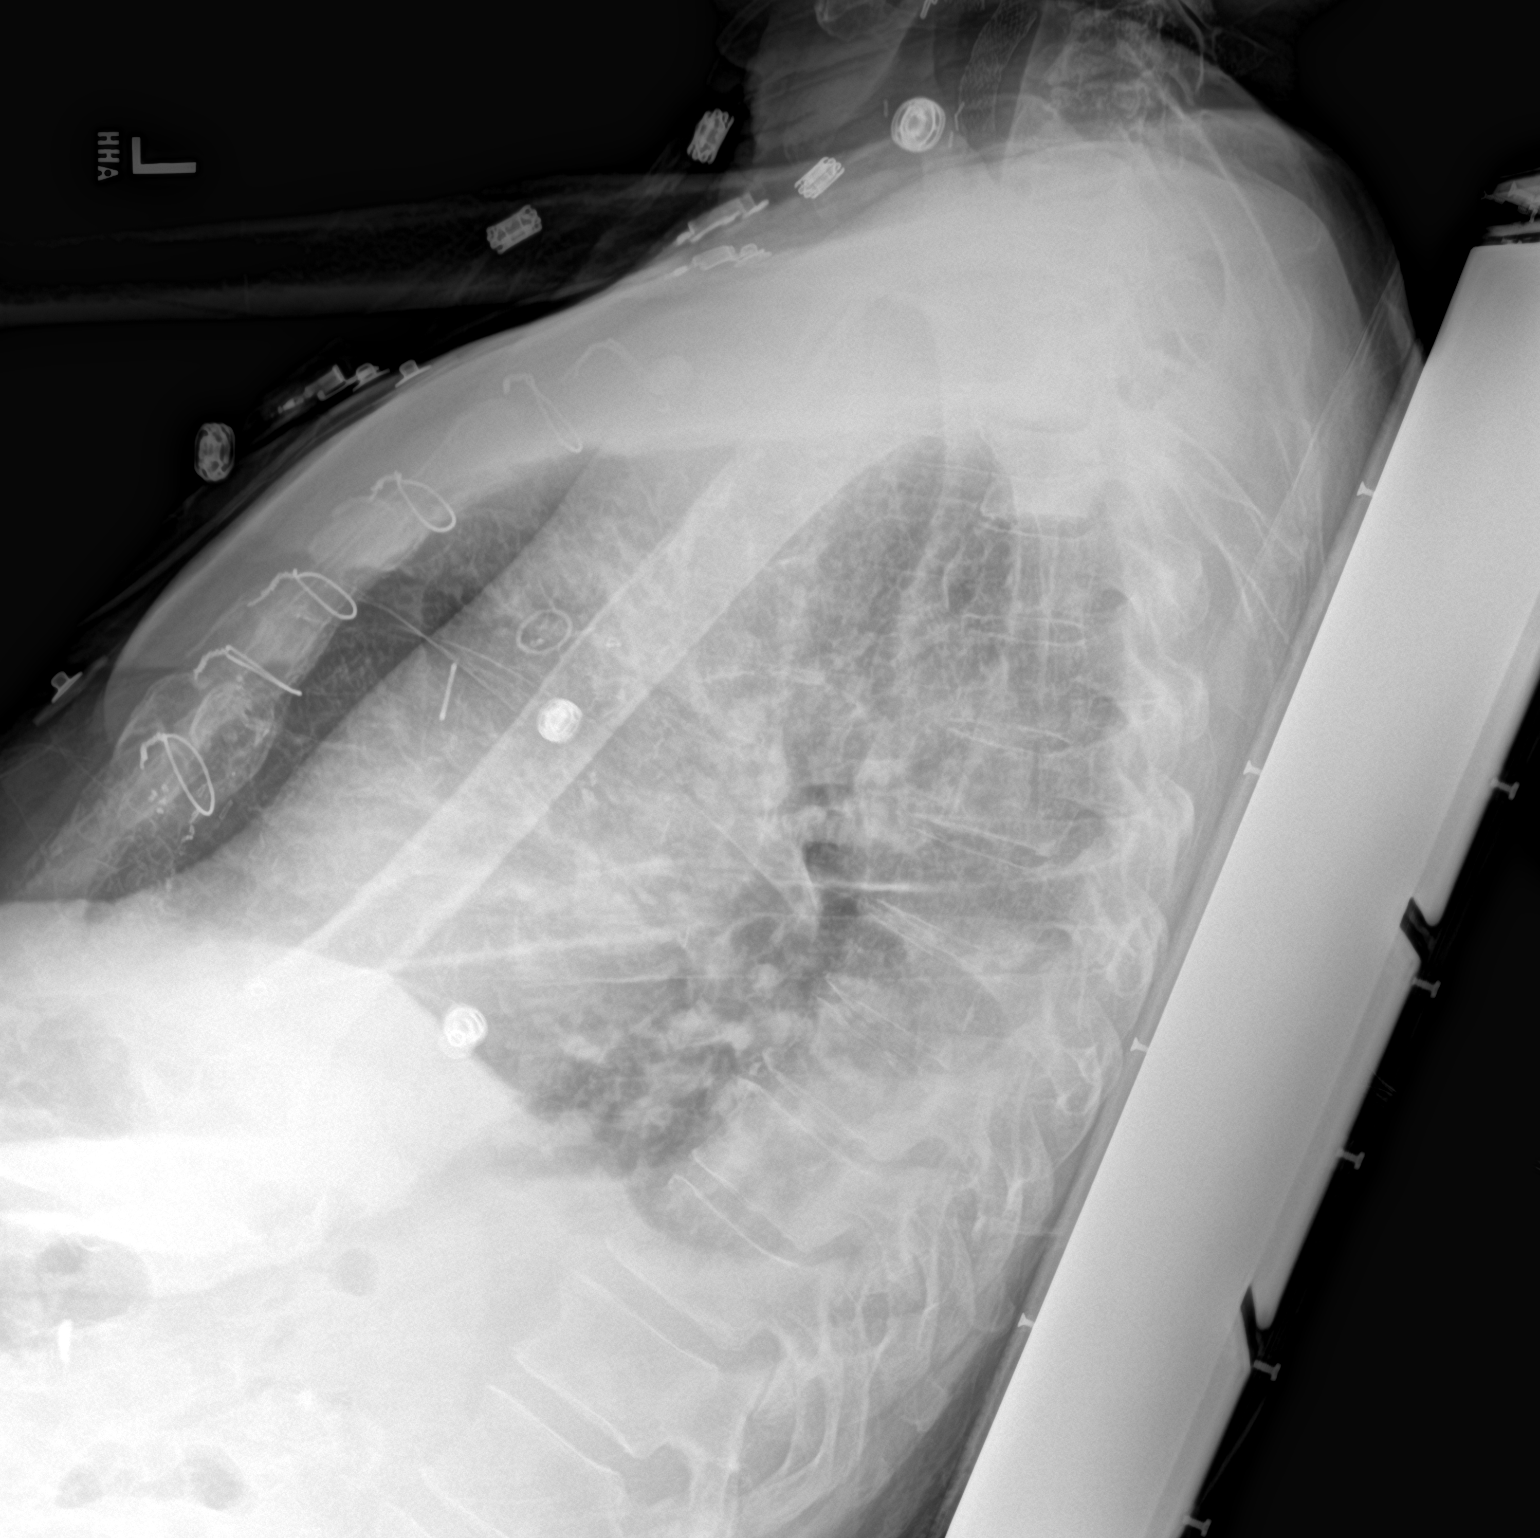

[chest ap]
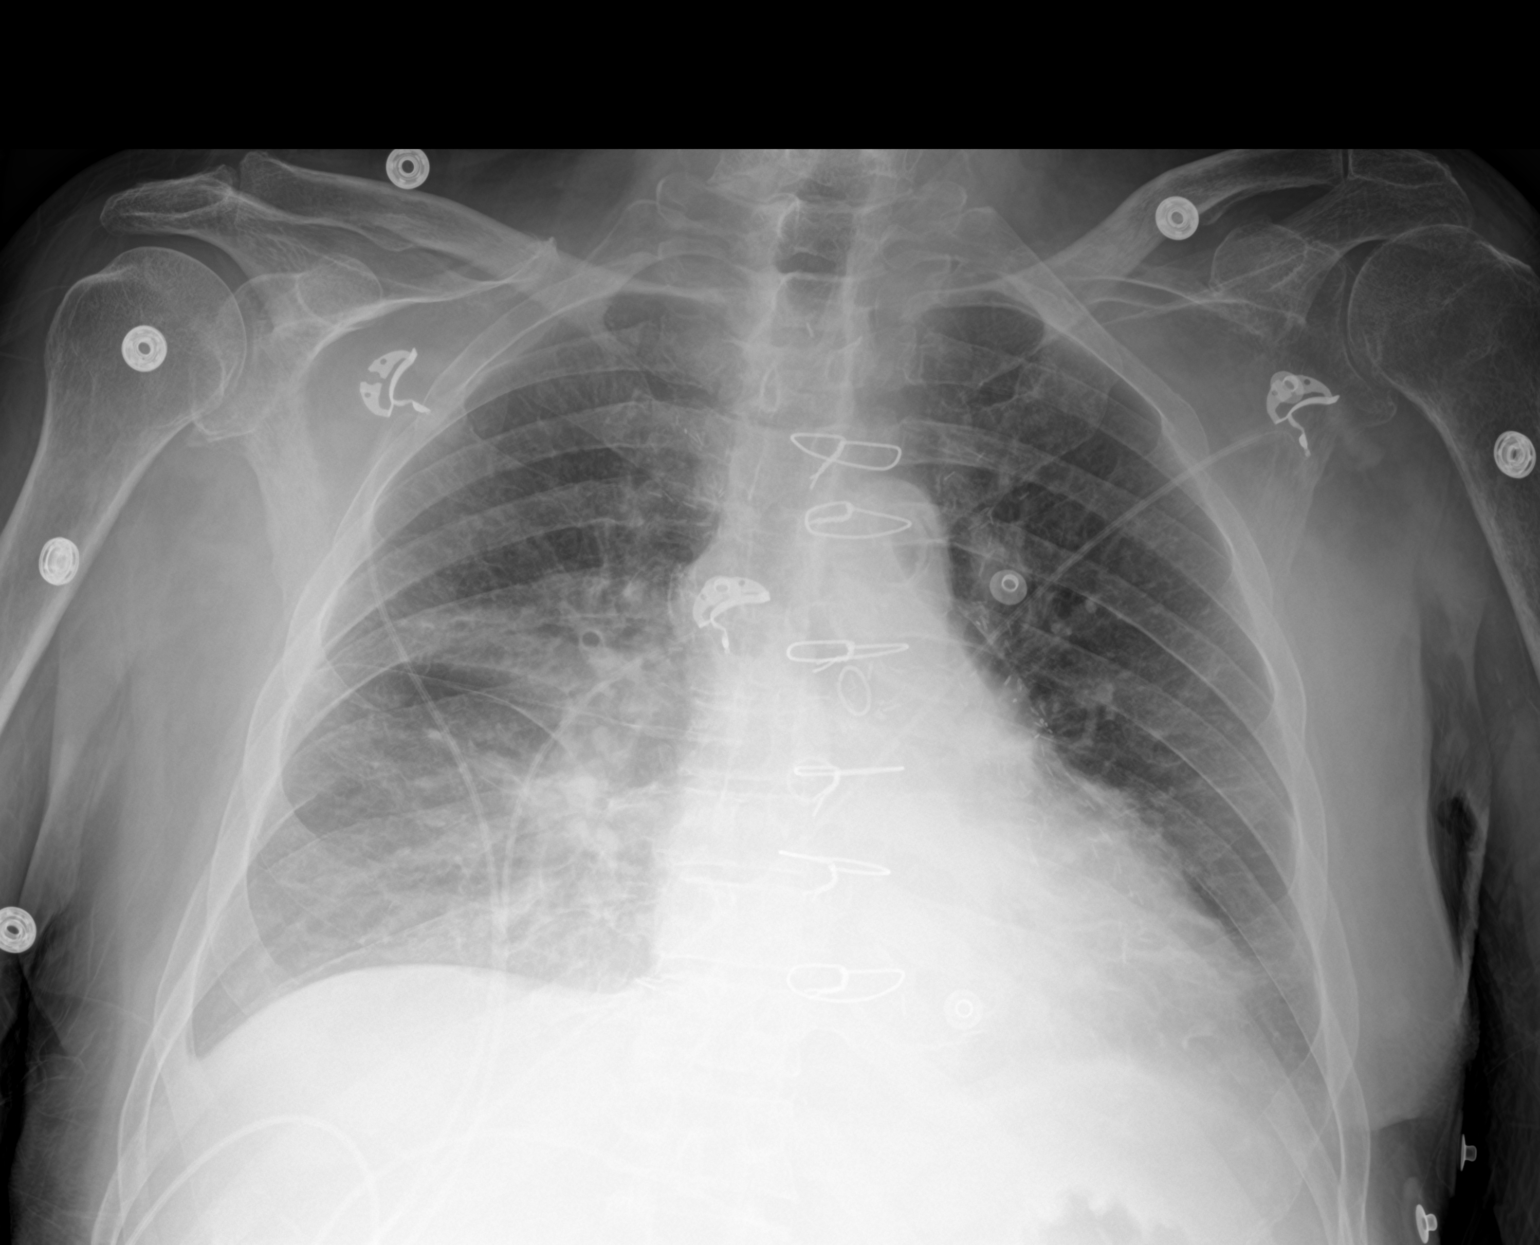

[2 of 2 positions shown; findings below may reference images not displayed]

FINDINGS: Sequelae of CABG are again identified. The cardiomediastinal
silhouette is unchanged with normal heart size. Right mid lung and
bilateral lower lung airspace opacities are unchanged. There are
small bilateral pleural effusions. No pneumothorax is identified.
IMPRESSION: Unchanged bilateral airspace disease which could reflect pneumonia
or edema with small pleural effusions.

## 2022-01-02 ENCOUNTER — Other Ambulatory Visit: Payer: Medicare Other | Admitting: Family Medicine

## 2022-01-07 ENCOUNTER — Other Ambulatory Visit (HOSPITAL_COMMUNITY): Payer: Self-pay

## 2022-01-07 MED ORDER — MORPHINE SULFATE (CONCENTRATE) 10 MG /0.5 ML PO SOLN
ORAL | 0 refills | Status: AC
  Filled 2022-01-07: qty 30, 20d supply, fill #0

## 2022-01-09 ENCOUNTER — Encounter (HOSPITAL_BASED_OUTPATIENT_CLINIC_OR_DEPARTMENT_OTHER): Payer: Medicare Other | Admitting: Internal Medicine

## 2022-02-10 DEATH — deceased

## 2022-03-19 ENCOUNTER — Ambulatory Visit: Payer: Medicare Other | Admitting: Physical Medicine & Rehabilitation

## 2022-06-04 ENCOUNTER — Ambulatory Visit: Payer: Medicare Other | Admitting: Dermatology

## 2022-12-04 IMAGING — CT NM PET TUM IMG SKULL BASE T - THIGH
1 of 7 series · 1 of 25 positions shown · non-contrast
Comparison: CT 06/24/2020

CLINICAL DATA: Prostate carcinoma with biochemical recurrence.
High-grade prostate cancer adenoma. PSA equal 1.6.

EXAM:
NUCLEAR MEDICINE PET SKULL BASE TO THIGH
TECHNIQUE: 8.58 mCi F18 Piflufolastat (Pylarify) was injected intravenously.
Full-ring PET imaging was performed from the skull base to thigh
after the radiotracer. CT data was obtained and used for attenuation
correction and anatomic localization.

[Series 3: pet sk_thigh ac · axial · 5.0mm · 4.07mm/px · 1 of 247 slices shown]
[im 148/247]
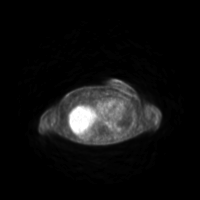

[1 of 25 positions shown; findings below may reference images not displayed]

FINDINGS: NECK

No radiotracer activity in neck lymph nodes.

Incidental CT finding: None

CHEST

No radiotracer accumulation within mediastinal or hilar lymph nodes.
No suspicious pulmonary nodules on the CT scan.

Incidental CT finding: Coronary artery calcification and aortic
atherosclerotic calcification.

ABDOMEN/PELVIS

Prostate: Foley catheter within the bladder which extends through
the prostate gland. There is activity centrally around the catheter
is favored related to prior TURP/TURBT. No clear evidence of
malignancy within the gland itself.

Lymph nodes: No significant radiotracer activity associated with
small pelvic lymph nodes. Bilateral small external iliac lymph nodes
have SUV max equal 3.2 on the RIGHT and 2.3 on the LEFT.

There is multiple small periaortic retroperitoneal nodes also
without significant radiotracer

Liver: No evidence of liver metastasis

Incidental CT finding: None

SKELETON

There multiple sclerotic lesions throughout the pelvis and spine.
Majority of the sclerotic lesions have no radiotracer activity.
However, 2 lesions have intense radiotracer activity. One sclerotic
lesion the LEFT iliac wing measuring 5 mm with SUV max equal 6.2.

Second sclerotic lesion within the RIGHT sacral ala measuring 10 mm
(image 173) with intense radiotracer activity (SUV max equal 17.2).

There are multiples scattered sclerotic lesions throughout the
cervical, thoracic and lumbar spine and pelvis WITHOUT radiotracer
activity. Number sclerotic lesions increased mildly from CT
06/25/2019

.
IMPRESSION: 1. Two intensely radiotracer avid sclerotic skeletal lesions in the
pelvis consistent with active prostate cancer sclerotic skeletal
metastasis.
2. Additional, too numerous to count, sclerotic skeletal lesions
throughout the spine and pelvis which do not have radiotracer
activity. This the sclerotic pattern is mildly advanced CT
06/24/2020.
[DATE]. No evidence of nodal metastasis or visceral metastasis
